# Patient Record
Sex: Female | Born: 1950 | Race: Black or African American | Hispanic: No | Marital: Single
Health system: Midwestern US, Community
[De-identification: ages and names within clinical notes are randomized; demographics above are authoritative.]

## PROBLEM LIST (undated history)

## (undated) DIAGNOSIS — I10 Essential (primary) hypertension: Secondary | ICD-10-CM

## (undated) DIAGNOSIS — E7800 Pure hypercholesterolemia, unspecified: Secondary | ICD-10-CM

## (undated) DIAGNOSIS — J441 Chronic obstructive pulmonary disease with (acute) exacerbation: Secondary | ICD-10-CM

## (undated) DIAGNOSIS — R1031 Right lower quadrant pain: Secondary | ICD-10-CM

## (undated) DIAGNOSIS — R109 Unspecified abdominal pain: Secondary | ICD-10-CM

## (undated) DIAGNOSIS — R0602 Shortness of breath: Secondary | ICD-10-CM

## (undated) DIAGNOSIS — I890 Lymphedema, not elsewhere classified: Secondary | ICD-10-CM

## (undated) DIAGNOSIS — E119 Type 2 diabetes mellitus without complications: Secondary | ICD-10-CM

## (undated) DIAGNOSIS — Z1231 Encounter for screening mammogram for malignant neoplasm of breast: Secondary | ICD-10-CM

---

## 2011-10-22 IMAGING — MG MAMMOGRAPHY SCREENING BILATERAL 3D TOMOSYNTHESIS WITH CAD
1 series · 4 of 4 positions shown · non-contrast
Comparison: none

MAMMOGRAPHY SCREENING WITH CAD, 10/22/11:
HISTORY: Screening.
TECHNIQUE: Digital mammograms were obtained.  These were interpreted both
primarily and with the aid of a computer assisted detection system.
BREAST DENSITY: (Level 3 of 4) The breast tissue is heterogeneously dense.
This may lower the sensitivity of mammography.

[R CC · right · 4 of 4 slices shown]
[im 1/4]
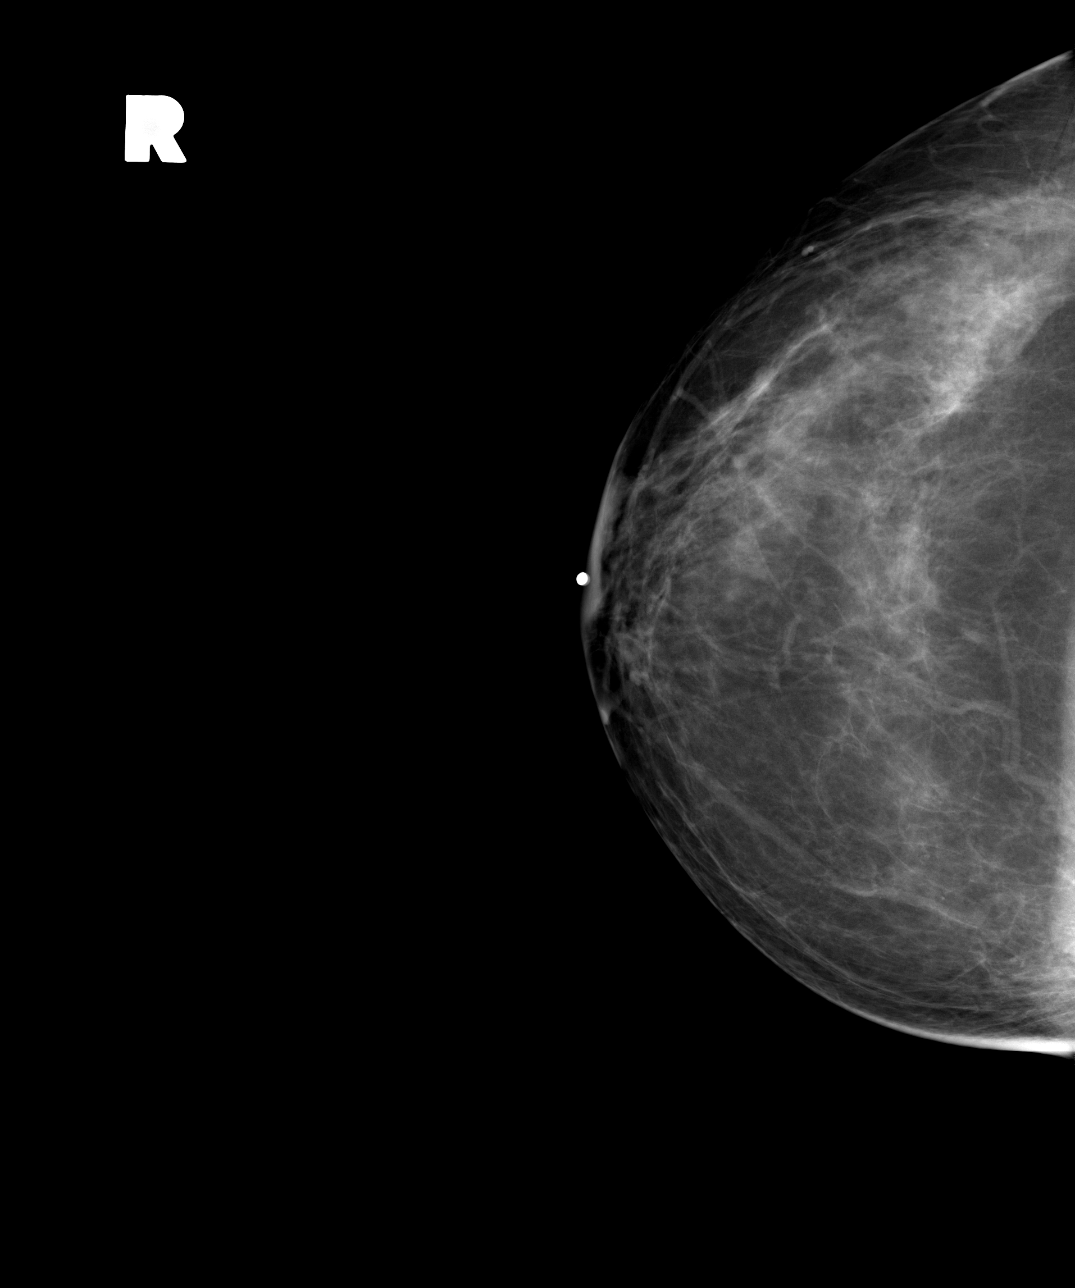
[im 2/4]
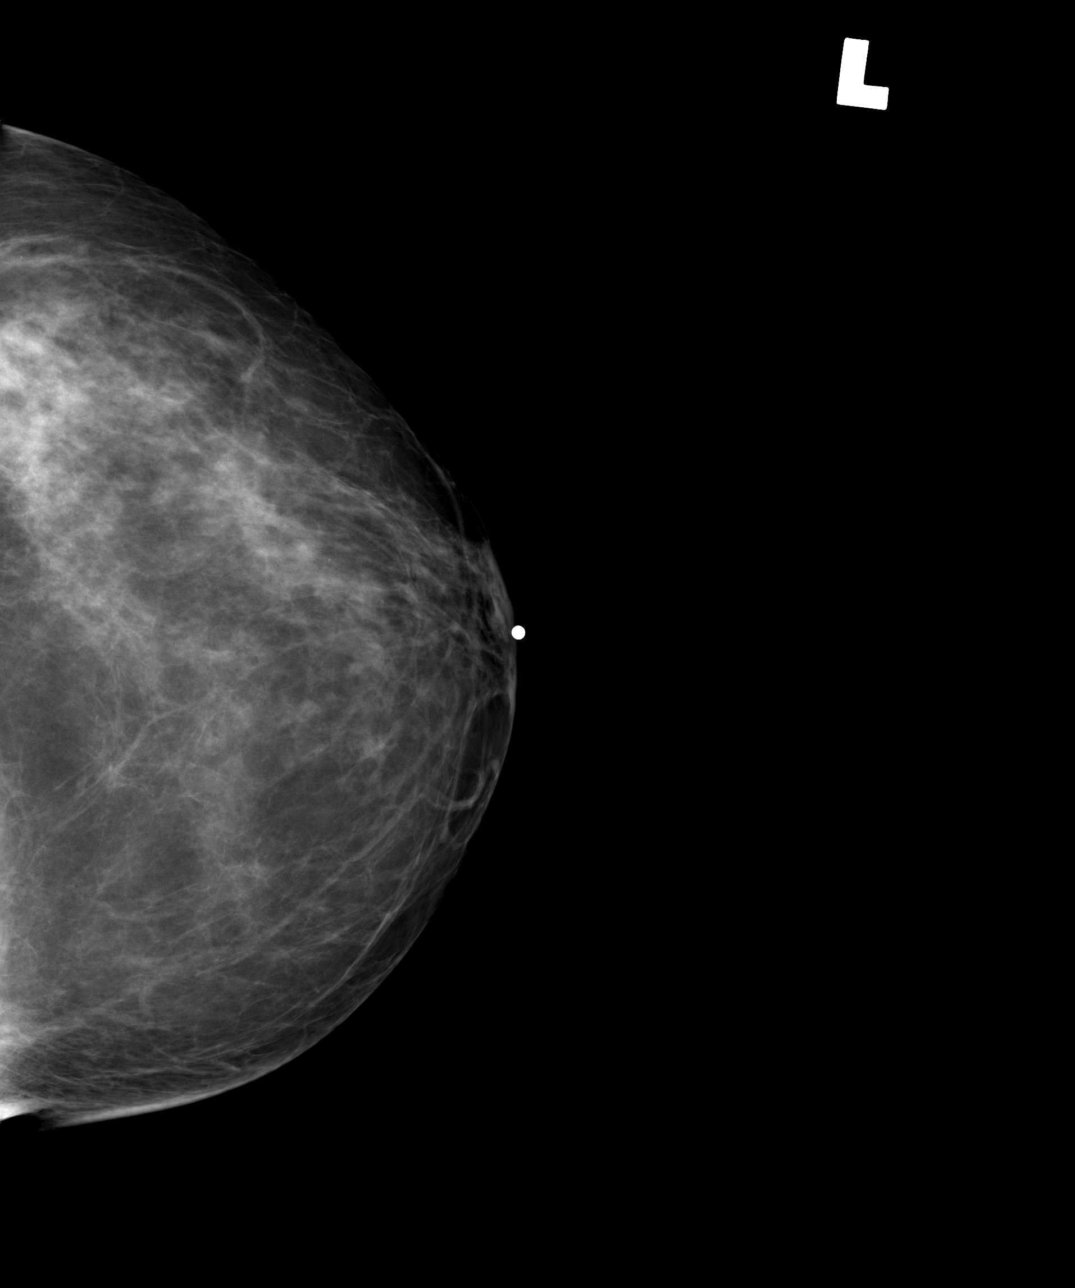
[im 3/4]
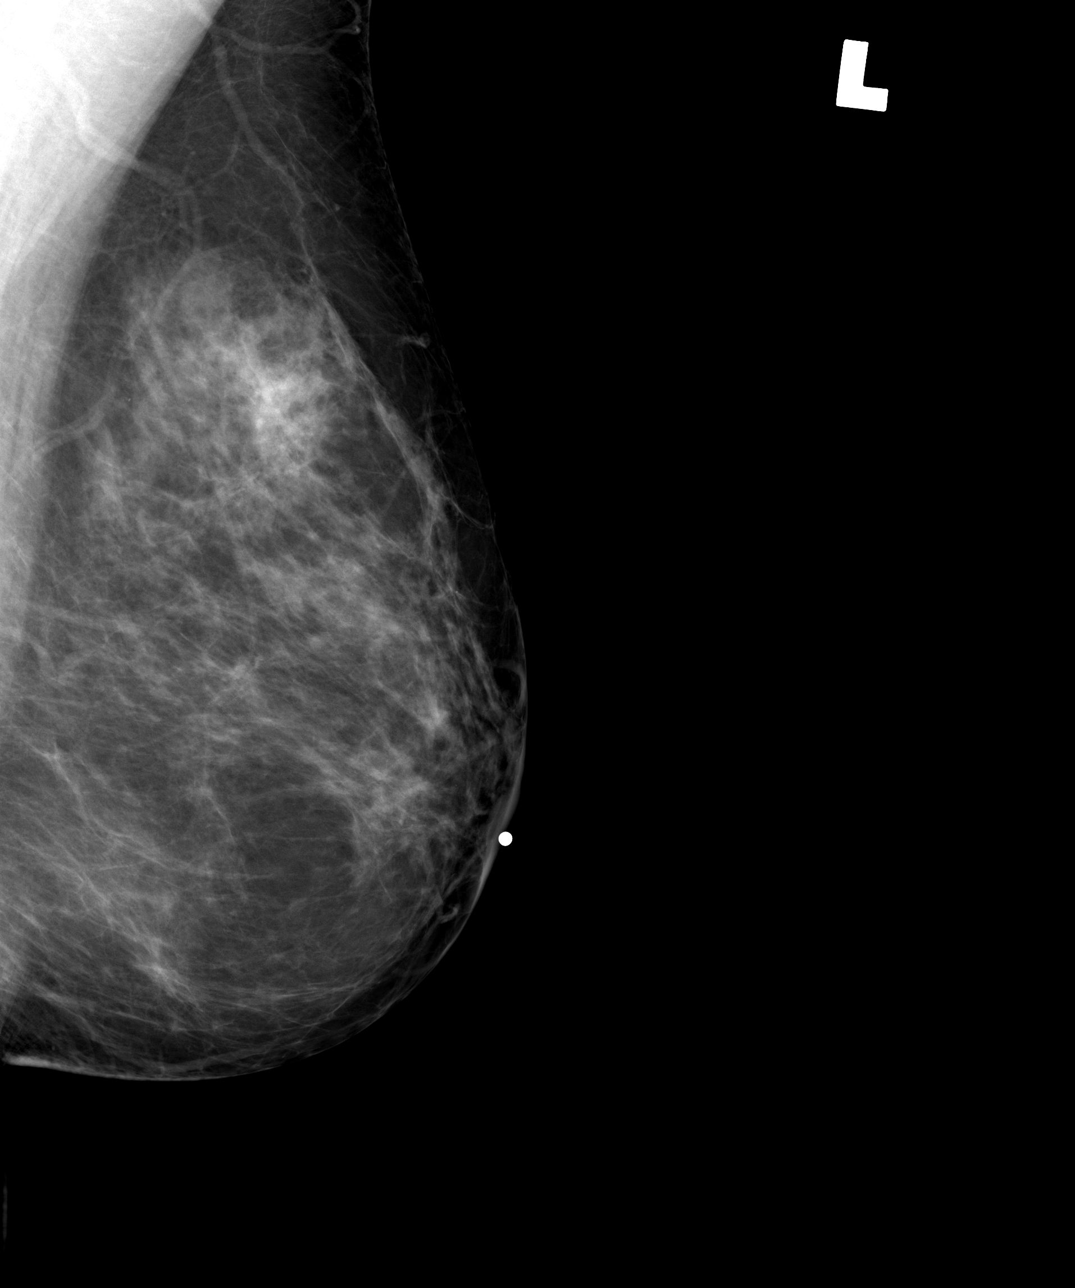
[im 4/4]
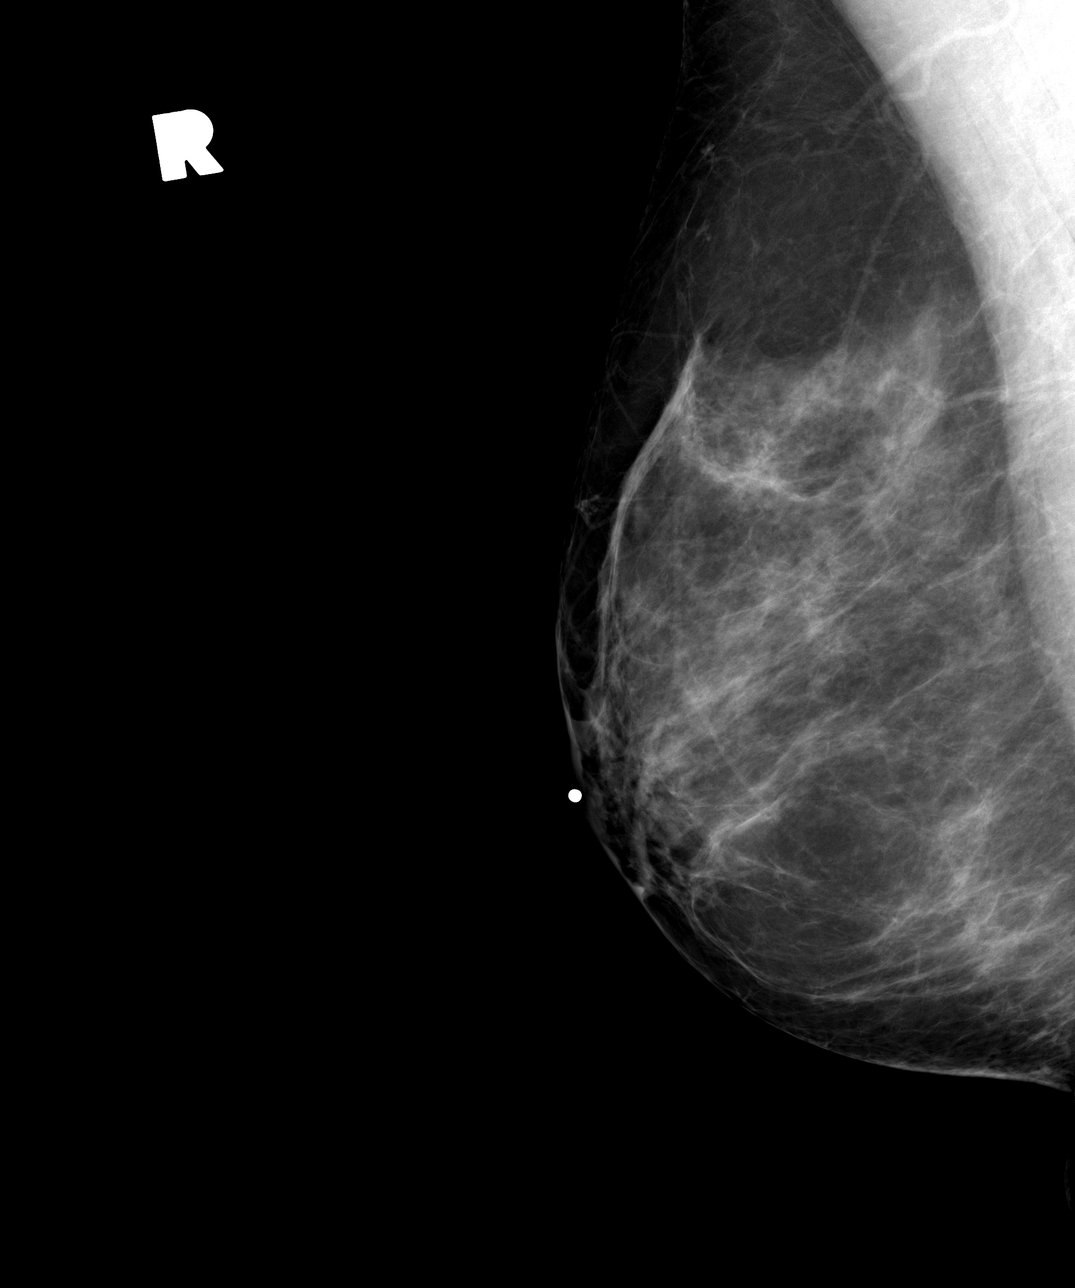

[4 of 4 positions shown; findings below may reference images not displayed]

FINDINGS: There is no dominant or new, developing mass or suspicious
cluster of microcalcifications.  There is no skin thickening, architectural
distortion or nipple retraction.
IMPRESSION: (BI-RADS 1) Negative mammogram.  Routine mammographic follow-up is
recommended.

## 2012-10-26 IMAGING — MG MAMMOGRAPHY SCREENING BILATERAL 3D TOMOSYNTHESIS WITH CAD
12 of 16 series · 12 of 32 positions shown · non-contrast
Comparison: Comparison is made to prior exams dating back to 09/15/06.

MAMMOGRAPHY SCREENING 3D TOMOSYNTHESIS WITH CAD, 10/26/12:
TECHNIQUE: Bilateral oblique mediolateral and craniocaudal full field
digital mammograms and 3D tomosynthesis were obtained.   In addition,
computer aided detection was utilized.
CLINICAL HISTORY: Screening.
BREAST DENSITY: (Level 2 of 4) There are scattered fibroglandular densities
which do not significantly interfere with mammographic visualization.

[R CC]
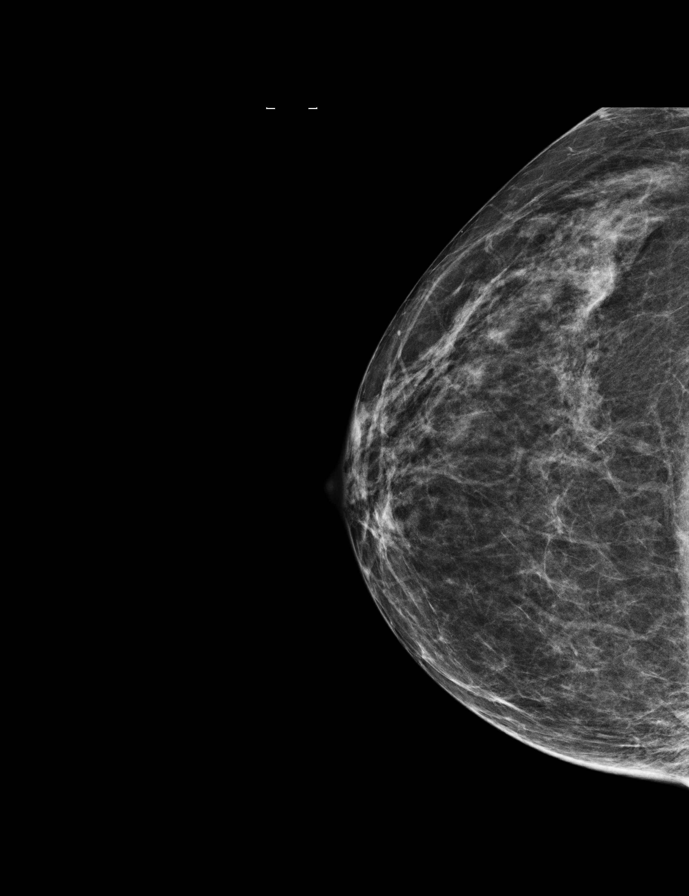

[L CC]
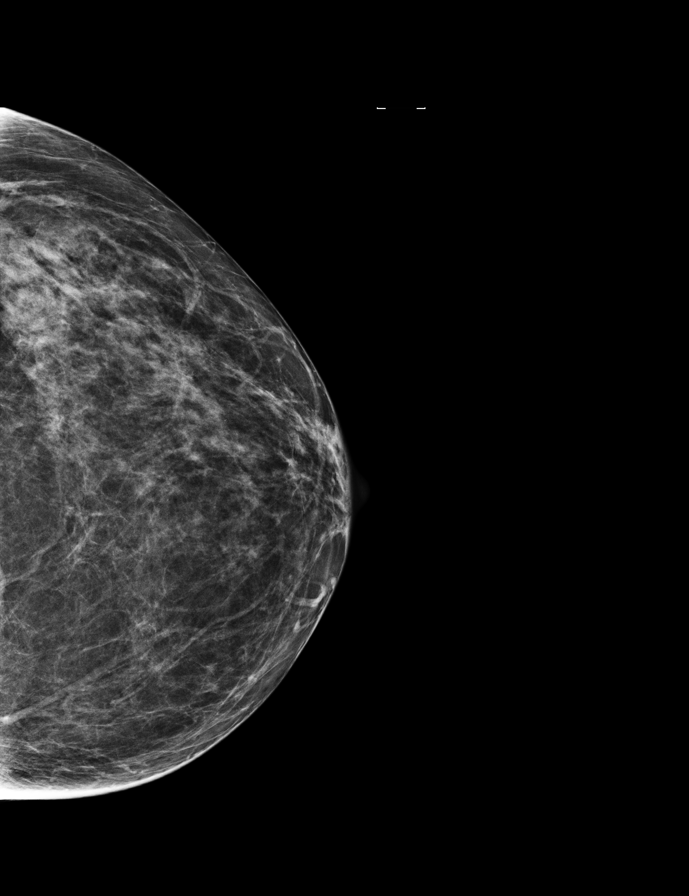

[L MLO]
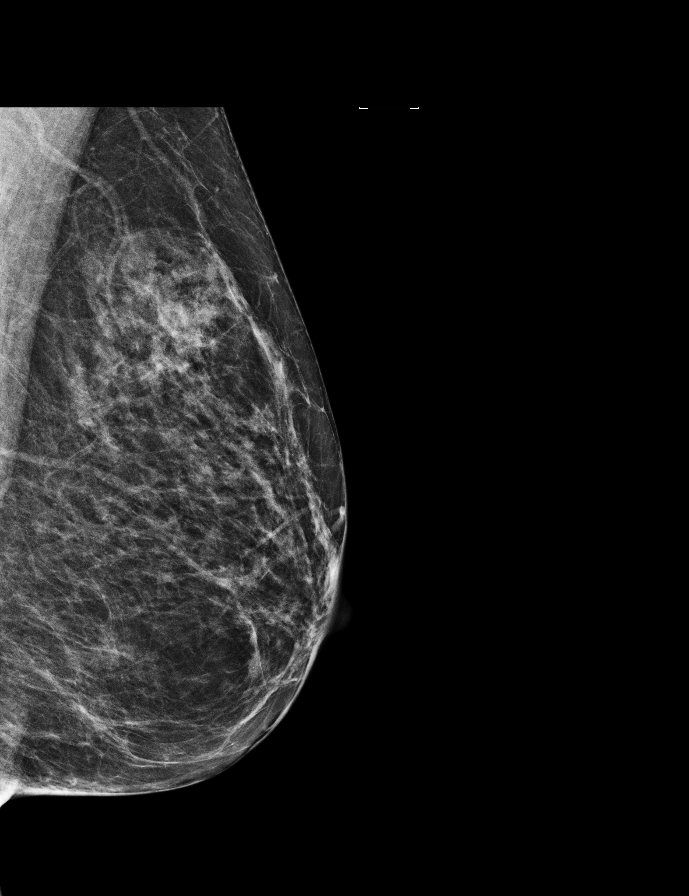

[L CC synth-2D]
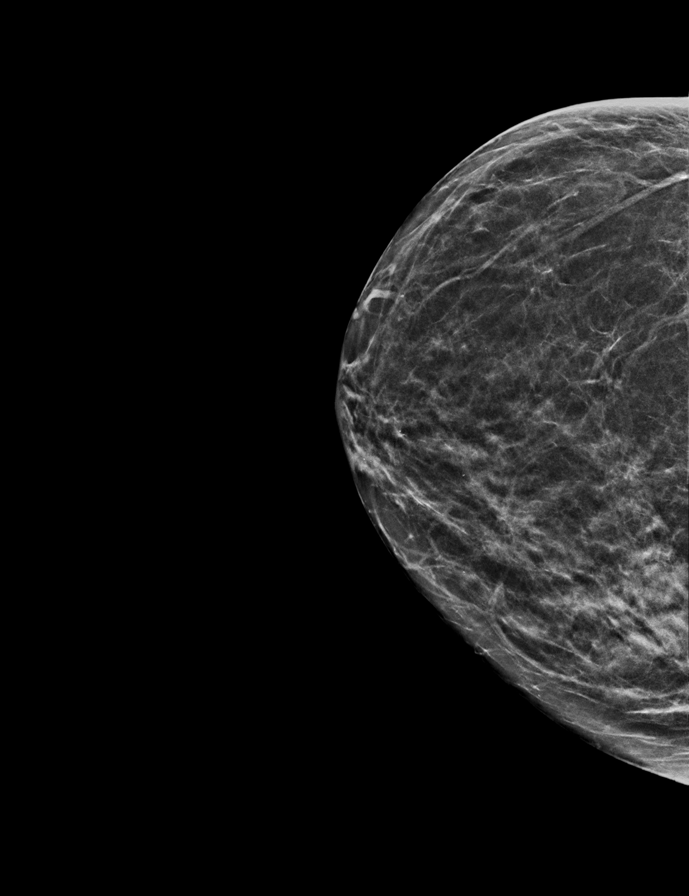

[R CC synth-2D]
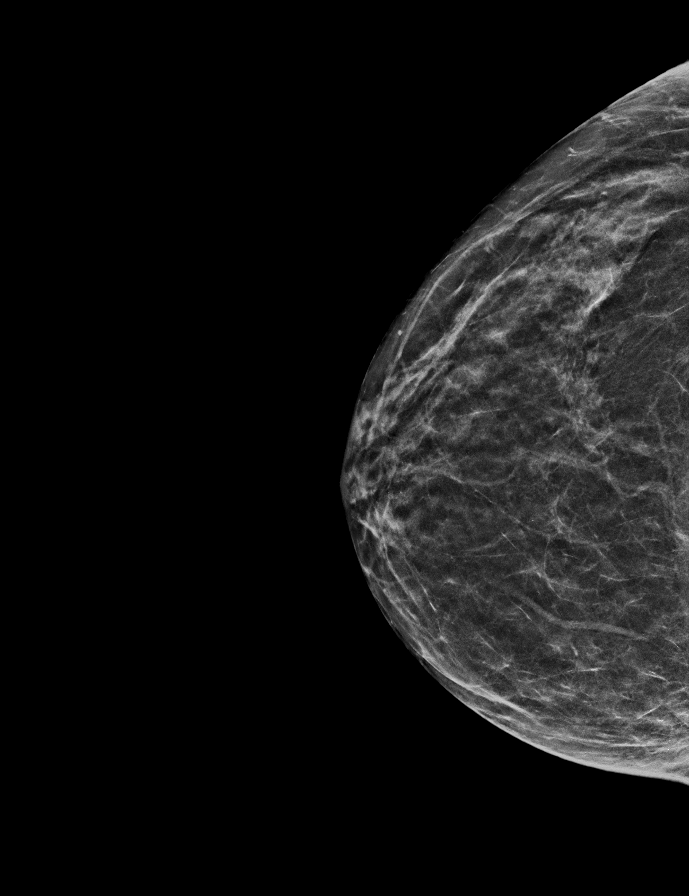

[R MLO synth-2D]
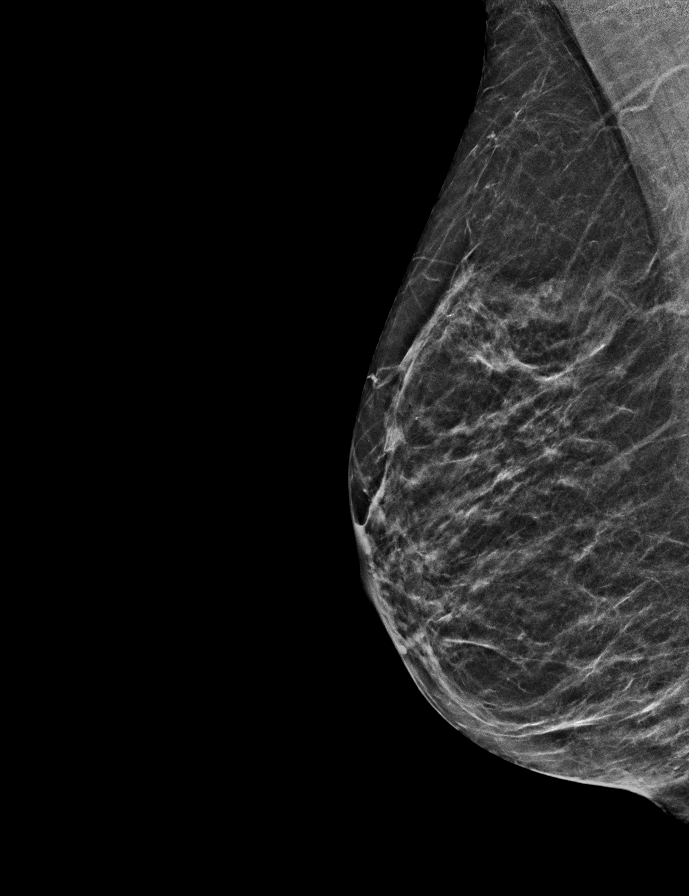

[R MLO]
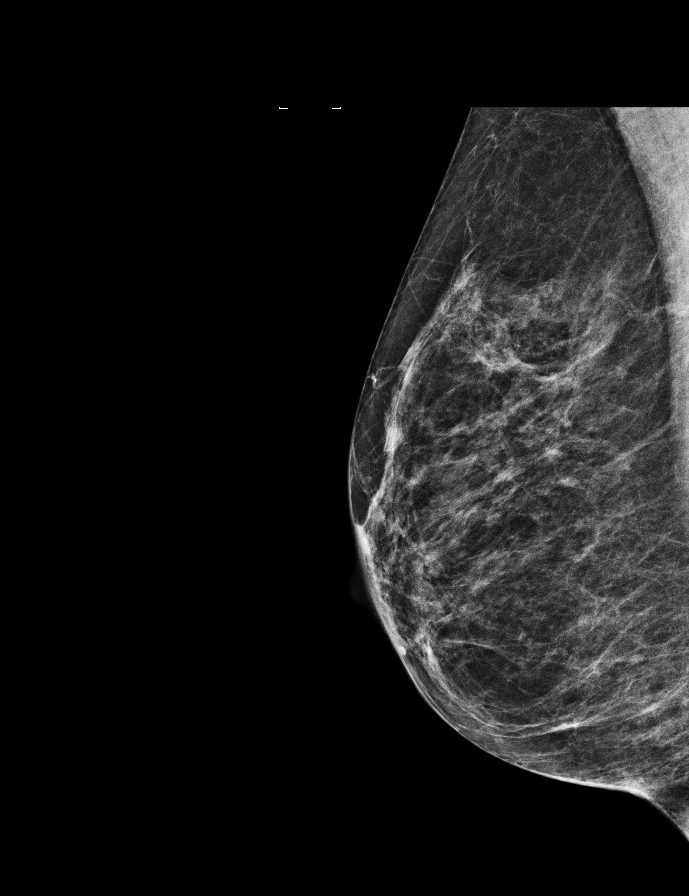

[L MLO synth-2D]
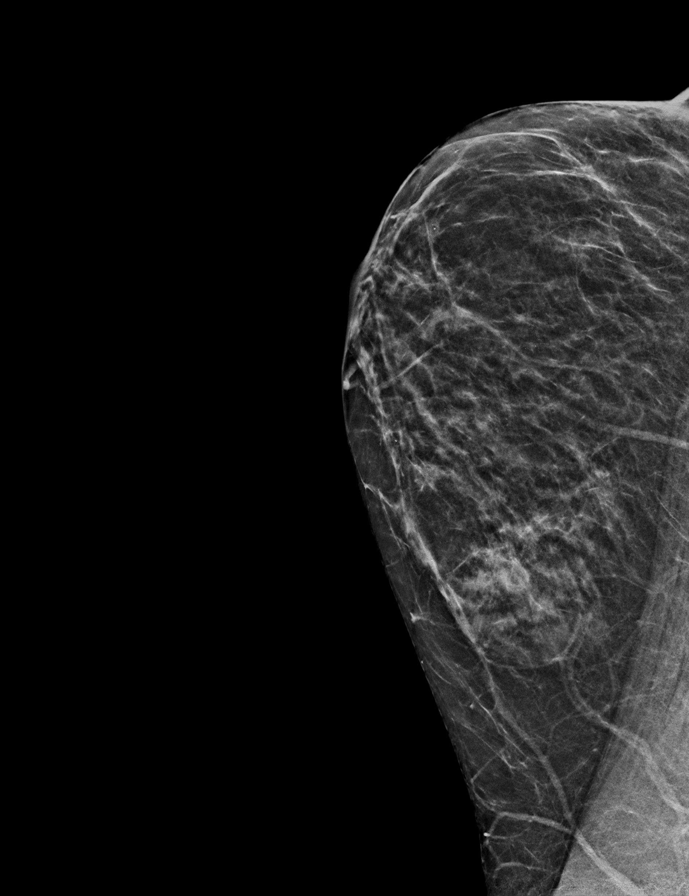

[L MLO tomo]
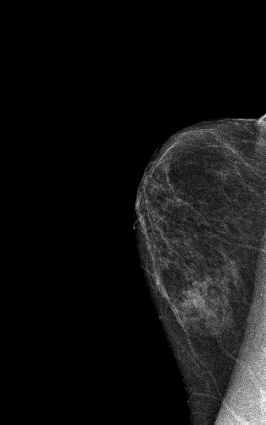

[R MLO tomo]
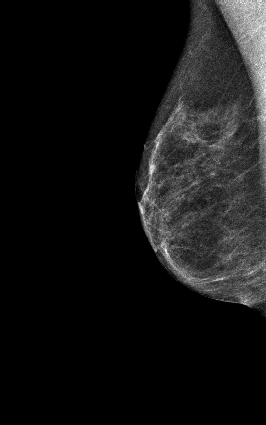

[R CC tomo]
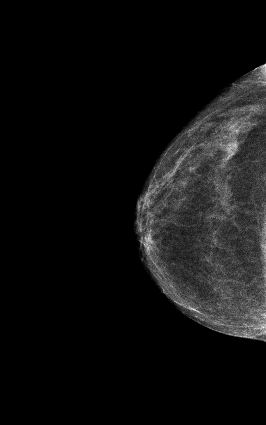

[L CC tomo]
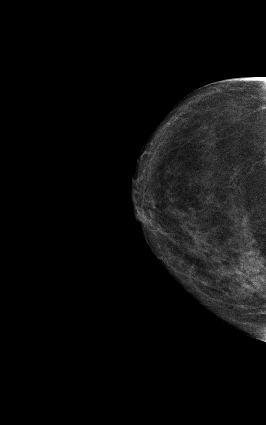

[12 of 32 positions shown; findings below may reference images not displayed]

FINDINGS: There is no evidence of mass nor malignant appearing
microcalcifications seen on today's examination.  No skin thickening is
identified.  There is thought to be an overall stable mammographic
appearance.  Benign calcifications are identified.
IMPRESSION: (BI-RADS 2) Benign findings.  Routine mammographic follow-up is
recommended.

## 2013-10-28 IMAGING — MG MAMMOGRAPHY SCREENING BILATERAL 3D TOMOSYNTHESIS WITH CAD
12 of 16 series · 12 of 32 positions shown · non-contrast
Comparison: Exams dating back to September 2007
BREAST DENSITY: (Level B) There are scattered areas of fibroglandular density.

MAMMOGRAPHY SCREENING 3D TOMOSYNTHESIS WITH CAD, 10/28/2013 [DATE]:
HISTORY: Screening
TECHNIQUE: Digital mammograms and 3-D Tomosynthesis were obtained. These were
interpreted both primarily and with the aid of computer-aided detection
system.

[R MLO synth-2D]
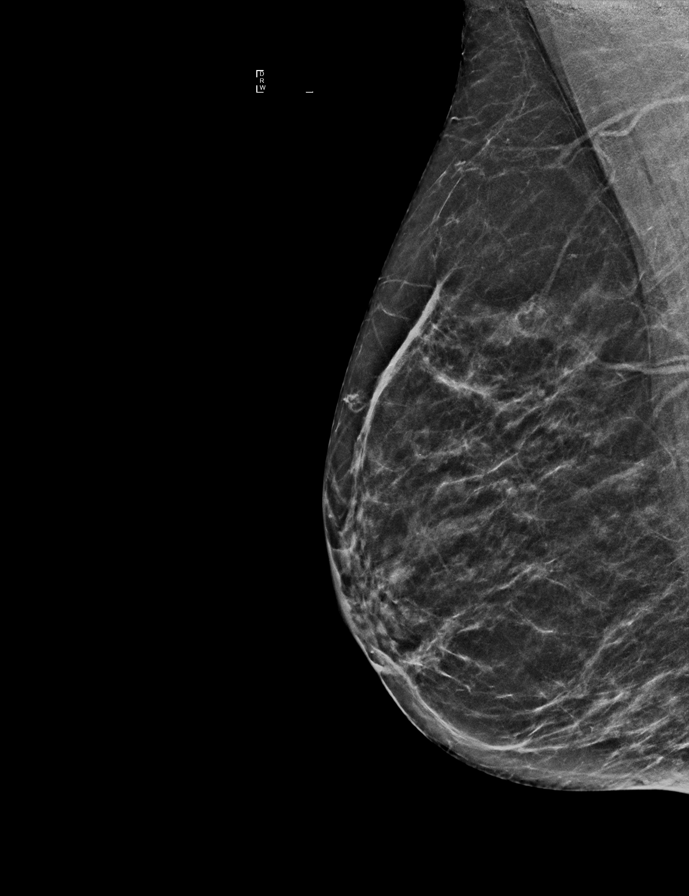

[R CC synth-2D]
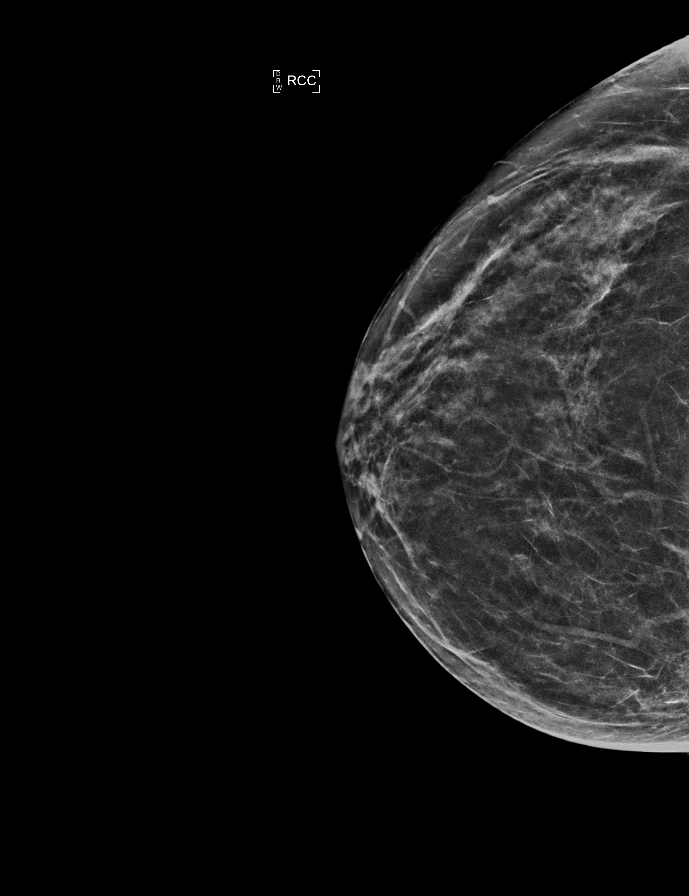

[L CC synth-2D]
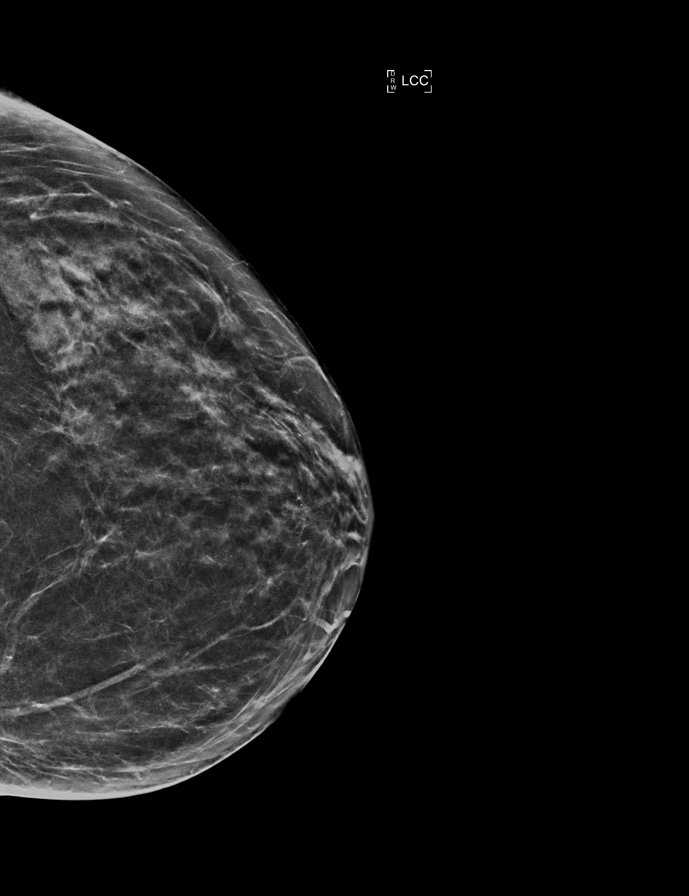

[R CC]
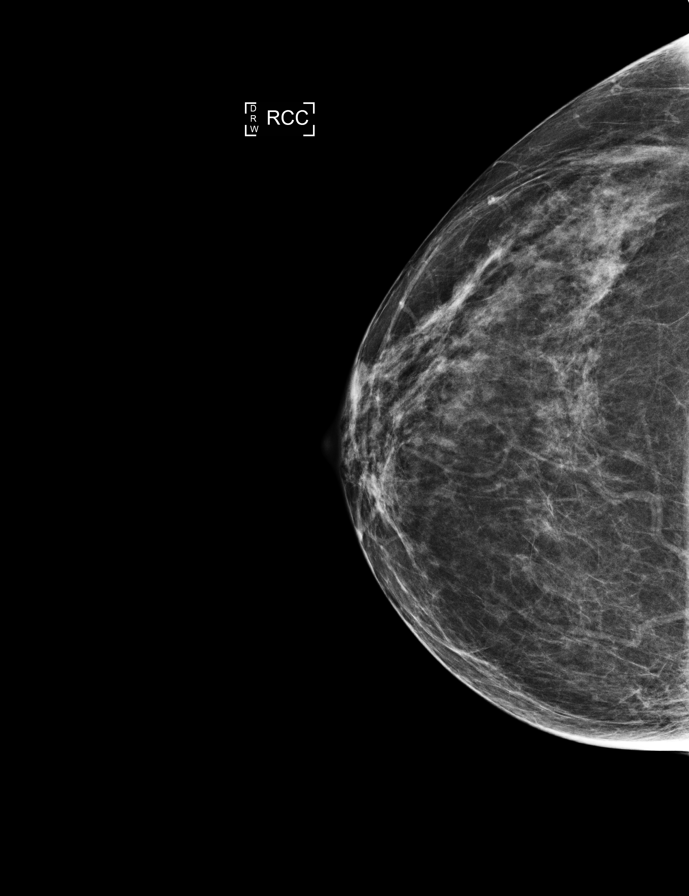

[L CC]
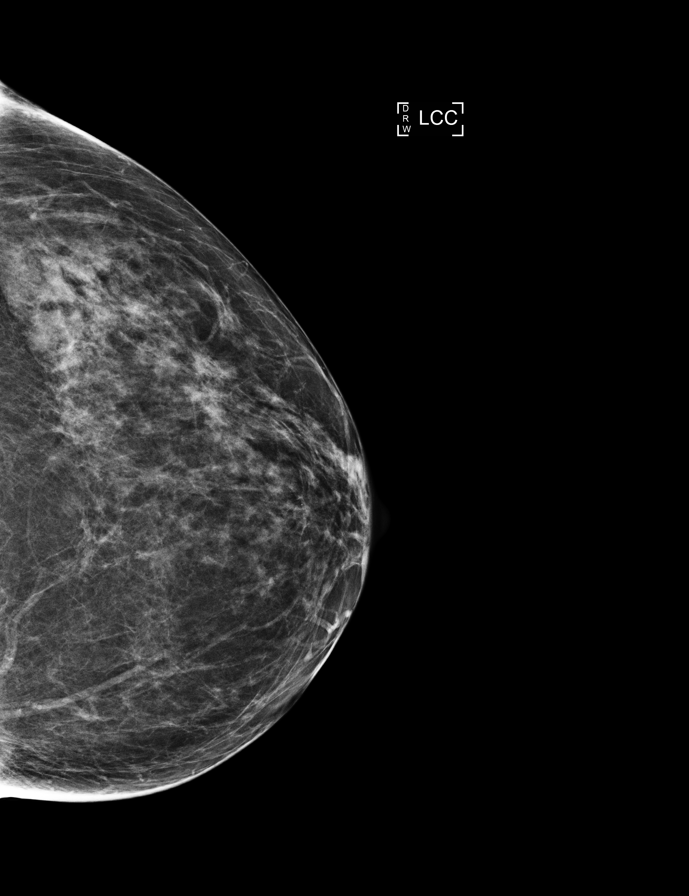

[R MLO]
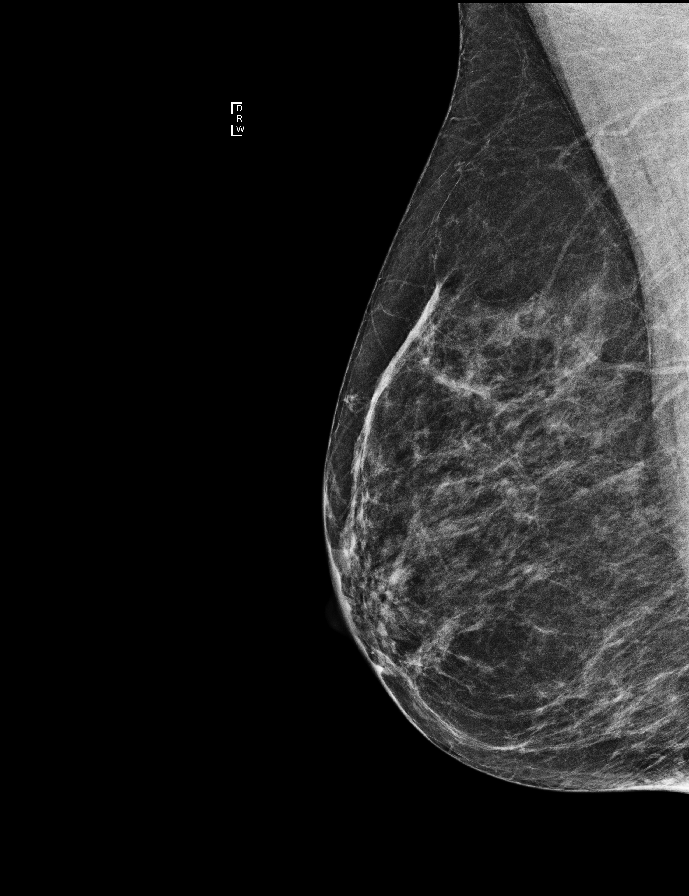

[L MLO synth-2D]
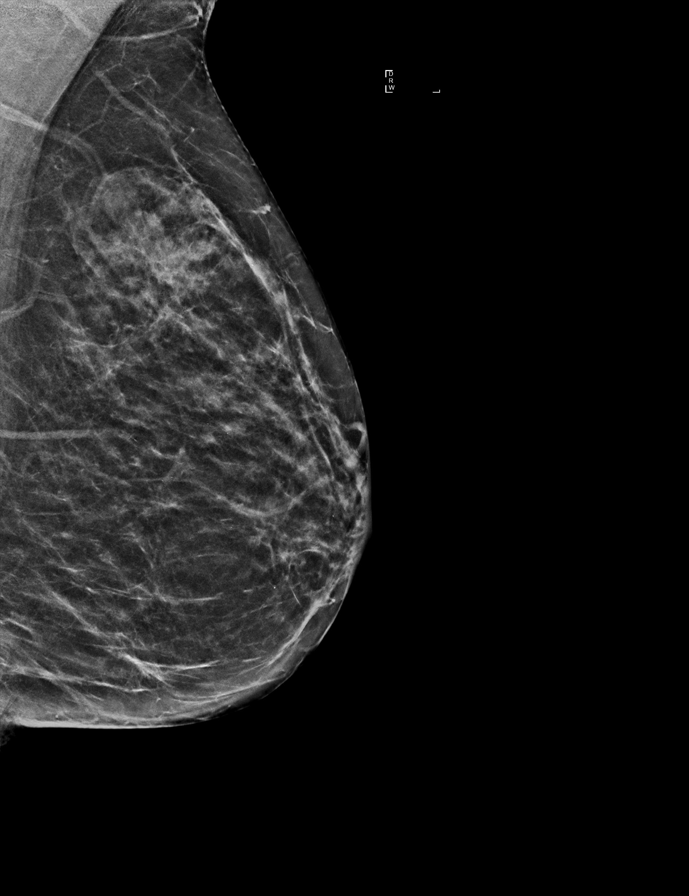

[L MLO]
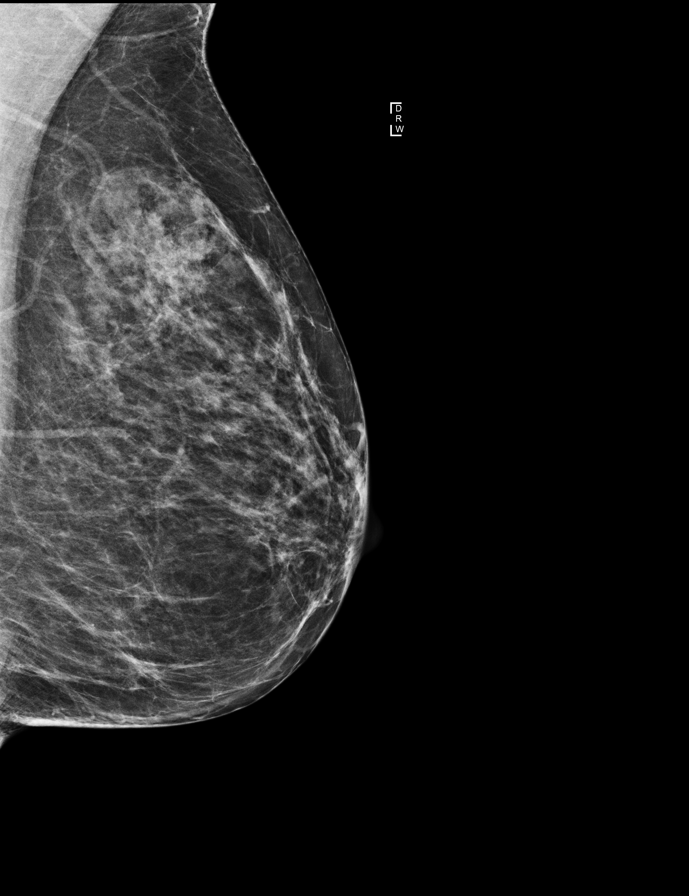

[R MLO tomo]
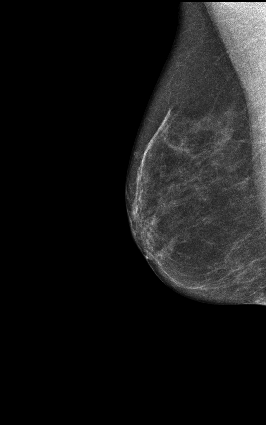

[L CC tomo]
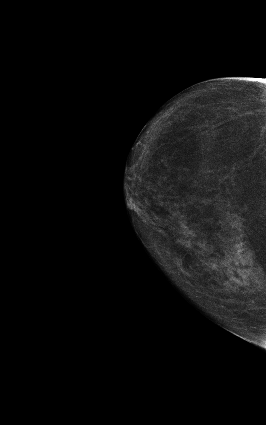

[R CC tomo]
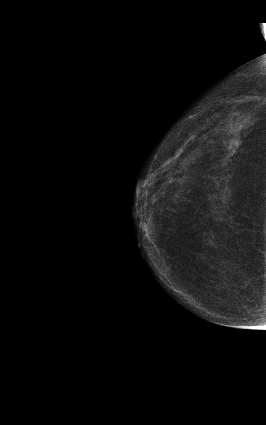

[L MLO tomo]
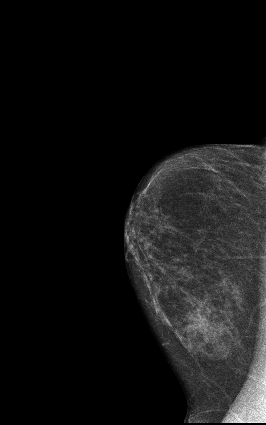

[12 of 32 positions shown; findings below may reference images not displayed]

FINDINGS: There is no evidence of mass or malignant appearing
microcalcifications
seen on today's examination. No skin thickening is identified. There is
thought
to be an overall stable mammographic appearance.
IMPRESSION: ( BI-RADS 1) Negative mammogram. Routine mammographic follow-up is
recommended.

## 2014-06-02 DEATH — deceased

## 2014-09-28 ENCOUNTER — Ambulatory Visit: Admit: 2014-09-28 | Discharge: 2014-09-28 | Payer: MEDICARE | Attending: Internal Medicine | Primary: Internal Medicine

## 2014-09-28 ENCOUNTER — Inpatient Hospital Stay: Admit: 2014-10-12 | Payer: MEDICARE | Primary: Internal Medicine

## 2014-09-28 DIAGNOSIS — I10 Essential (primary) hypertension: Secondary | ICD-10-CM

## 2014-09-28 LAB — AMB POC GLUCOSE, QUANTITATIVE, BLOOD: Glucose POC: 133 mg/dL

## 2014-09-28 LAB — AMB POC HEMOGLOBIN A1C: Hemoglobin A1c (POC): 5.9 %

## 2014-09-28 NOTE — Patient Instructions (Signed)
Isosorbide Dinitrate 10 mg: take 2 tabs every 8 hours  Lisinopril 40 mg: take 1 tab a dayt  Amlodipine 5 mg: take 1 ta b a day  Metoprolol 200 mg 2 times a day  Clonidine 0.1 mg daily

## 2014-09-28 NOTE — Progress Notes (Signed)
Chief Complaint   Patient presents with   ??? New Patient   ??? Establish Care   ??? Hypertension   ??? Knee Pain     RT knee pain (arthritis)   ??? COPD     On 4L of O2 all day   ??? CHF

## 2014-09-28 NOTE — Progress Notes (Signed)
Chief Complaint   Patient presents with   ??? New Patient   ??? Establish Care   ??? Hypertension   ??? Knee Pain     RT knee pain (arthritis)   ??? COPD     On 4L of O2 all day   ??? CHF     HPI:  Brittney Shelton is a 64 y.o. AA female with multiple medical problems including DM, COPD on home oxygen, hypertension, chf presenting to establish care.   COPD:  Has been on home oxygen(4 L) for  Years, does not see lung doctor, stopped smoking at age 60 years  Hypertension: on lisinopril 40 mg, amlodipine 5 mg, clonidine 0.1 mg, toprol XL 200 mg. Blood pressure is noted to elevated  Hypercholesterol: she takes pravastatin   Diabetes type 2: takes metformin 500 mg 2X a day, A1C today=5.9 %, she has an eye surgery scheduled for 10/20/13.  Patient also has h/o chf, Patient has never seen a cardiologist.    Review of Systems  Constitutional: negative for fevers/chills, positive for weight loss  Eyes:   positive for visual disturbance in right eye  Respiratory:  positive for cough, shortness of breath, no wheezing  CV:   negative for chest pain, palpitations, positive lower extremity edema  GI:   negative for nausea, vomiting, diarrhea, abdominal pain  Endo:               negative for polyuria,polydipsia,polyphagia,heat intolerance  Genitourinary: negative for frequency, dysuria  Integument:  negative for rash and pruritus  Hematologic:  negative for easy bruising and gum/nose bleeding  Musculoskel: positive for knee joint pain  Neurological:  negative for headaches, dizziness, vertigo,and gait   Behavl/Psych: negative for feelings of anxiety, depression, mood changes    Past Medical History   Diagnosis Date   ??? Chronic obstructive pulmonary disease (HCC)    ??? Congestive heart failure (HCC)    ??? Hypertension    ??? Diabetes (HCC)      History reviewed. No pertinent past surgical history.    Social History   ??? Marital Status: SINGLE     Spouse Name: N/A   ??? Number of Children: N/A   ??? Years of Education: N/A     Social History Main Topics    ??? Smoking status: Never Smoker    ??? Smokeless tobacco: Never Used   ??? Alcohol Use: No   ??? Drug Use: No   ??? Sexual Activity: No     Family History   Problem Relation Age of Onset   ??? Diabetes Mother    ??? Arthritis-osteo Father    ??? COPD Sister    ??? Diabetes Brother    ??? Diabetes Brother      Current Outpatient Prescriptions   Medication Sig Dispense Refill   ??? albuterol (PROVENTIL VENTOLIN) 2.5 mg /3 mL (0.083 %) nebulizer solution by Nebulization route once.     ??? amLODIPine (NORVASC) 5 mg tablet Take 5 mg by mouth daily.     ??? aspirin 81 mg chewable tablet Take 81 mg by mouth daily.     ??? calcium carbonate (OS-CAL) 500 mg calcium (1,250 mg) tablet Take  by mouth daily.     ??? celecoxib (CELEBREX) 100 mg capsule Take  by mouth two (2) times a day.     ??? cloNIDine HCl (CATAPRES) 0.1 mg tablet Take  by mouth two (2) times a day.     ??? furosemide (LASIX) 20 mg tablet Take  by  mouth daily.     ??? isosorbide dinitrate (ISORDIL) 10 mg tablet Take  by mouth three (3) times daily.     ??? lisinopril (PRINIVIL, ZESTRIL) 40 mg tablet Take 40 mg by mouth daily.     ??? metFORMIN (GLUCOPHAGE) 500 mg tablet Take  by mouth two (2) times daily (with meals).     ??? metoprolol succinate (TOPROL-XL) 200 mg XL tablet Take 200 mg by mouth daily.     ??? Oxygen      ??? ondansetron (ZOFRAN ODT) 4 mg disintegrating tablet Take 4 mg by mouth every eight (8) hours as needed for Nausea.     ??? potassium chloride SR (KLOR-CON 10) 10 mEq tablet Take  by mouth.     ??? pravastatin (PRAVACHOL) 20 mg tablet Take 20 mg by mouth nightly.     ??? traMADol (ULTRAM) 50 mg tablet Take 50 mg by mouth every six (6) hours as needed for Pain.       No Known Allergies    Objective:  BP 178/70 mmHg   Pulse 80   Temp(Src) 96.6 ??F (35.9 ??C) (Oral)   Resp 18   Ht 5\' 6"  (1.676 m)   Wt 317 lb (143.79 kg)   BMI 51.19 kg/m2   SpO2 95%  Physical Exam:   General appearance - alert, morbidly obese appearing, in no distress  Mental status - alert, oriented to person, place, and time   EYE-PERRL, EOMI,  Neck - supple, no jvd, no significant adenopathy   Chest - symmetric decreased air entry, no wheezes, no rales or rhonchi   Heart - normal rate, regular rhythm, elevated blood pressure noted  Abdomen - obese, soft, nontender, nondistended, no organomegaly  Ext-peripheral pulses normal, bilateral lymphedema  Neuro -alert, oriented, normal speech, no focal findings    Assessment/Plan:    ICD-10-CM ICD-9-CM    1. Hypertension, uncontrolled I10 401.9 METABOLIC PANEL, COMPREHENSIVE      MICROALBUMIN:CREATININE RATIO, RANDOM URINE      UA/M W/RFLX CULTURE, ROUTINE   2. Well controlled type 2 diabetes mellitus (HCC) E11.9 250.00 METABOLIC PANEL, COMPREHENSIVE      MICROALBUMIN:CREATININE RATIO, RANDOM URINE      AMB POC HEMOGLOBIN A1C      AMB POC GLUCOSE, QUANTITATIVE, BLOOD      UA/M W/RFLX CULTURE, ROUTINE   3. Hypercholesterolemia E78.0 272.0 LIPID PANEL   4. 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 On home oxygen therapy Z99.81 V46.2    6. Morbid obesity due to excess calories (HCC) E66.01 278.01 LIPID PANEL   7. Anemia, normocytic normochromic D64.9 285.9 CBC WITH AUTOMATED DIFF   8. Chronic acquired lymphedema I89.0 457.1      Patient Instructions   Isosorbide Dinitrate 10 mg: take 2 tabs every 8 hours  Lisinopril 40 mg: take 1 tab a dayt  Amlodipine 5 mg: take 1 ta b a day  Metoprolol 200 mg 2 times a day  Clonidine 0.1 mg daily      Follow-up Disposition:  Return 1-2 weeks, for f/u result and blood pressure.

## 2014-09-29 DIAGNOSIS — I10 Essential (primary) hypertension: Secondary | ICD-10-CM

## 2014-09-29 LAB — METABOLIC PANEL, COMPREHENSIVE
A-G Ratio: 1.3 (ref 1.1–2.5)
ALT (SGPT): 6 IU/L (ref 0–32)
AST (SGOT): 18 IU/L (ref 0–40)
Albumin: 4.4 g/dL (ref 3.6–4.8)
Alk. phosphatase: 65 IU/L (ref 39–117)
BUN/Creatinine ratio: 18 (ref 11–26)
BUN: 18 mg/dL (ref 8–27)
Bilirubin, total: 1 mg/dL (ref 0.0–1.2)
CO2: 27 mmol/L (ref 18–29)
Calcium: 9.4 mg/dL (ref 8.7–10.3)
Chloride: 95 mmol/L — ABNORMAL LOW (ref 97–108)
Creatinine: 1 mg/dL (ref 0.57–1.00)
GFR est AA: 68 mL/min/{1.73_m2} (ref >59)
GFR est non-AA: 59 mL/min/{1.73_m2} — ABNORMAL LOW (ref >59)
GLOBULIN, TOTAL: 3.5 g/dL (ref 1.5–4.5)
Glucose: 99 mg/dL (ref 65–99)
Potassium: 4.1 mmol/L (ref 3.5–5.2)
Protein, total: 7.9 g/dL (ref 6.0–8.5)
Sodium: 143 mmol/L (ref 134–144)

## 2014-09-29 LAB — CBC WITH AUTOMATED DIFF
ABS. BASOPHILS: 0 10*3/uL (ref 0.0–0.2)
ABS. EOSINOPHILS: 0.2 10*3/uL (ref 0.0–0.4)
ABS. IMM. GRANS.: 0 10*3/uL (ref 0.0–0.1)
ABS. MONOCYTES: 0.7 10*3/uL (ref 0.1–0.9)
ABS. NEUTROPHILS: 4.1 10*3/uL (ref 1.4–7.0)
Abs Lymphocytes: 1.4 10*3/uL (ref 0.7–3.1)
BASOPHILS: 1 %
EOSINOPHILS: 2 %
HCT: 41.4 % (ref 34.0–46.6)
HGB: 13.2 g/dL (ref 11.1–15.9)
IMMATURE GRANULOCYTES: 0 %
Lymphocytes: 22 %
MCH: 28.1 pg (ref 26.6–33.0)
MCHC: 31.9 g/dL (ref 31.5–35.7)
MCV: 88 fL (ref 79–97)
MONOCYTES: 10 %
NEUTROPHILS: 65 %
PLATELET: 262 10*3/uL (ref 150–379)
RBC: 4.69 x10E6/uL (ref 3.77–5.28)
RDW: 17.5 % — ABNORMAL HIGH (ref 12.3–15.4)
WBC: 6.3 10*3/uL (ref 3.4–10.8)

## 2014-09-29 LAB — MICROSCOPIC EXAMINATION
Bacteria: NONE SEEN
Casts: NONE SEEN /lpf

## 2014-09-29 LAB — UA/M W/RFLX CULTURE, ROUTINE
Bilirubin: NEGATIVE
Blood: NEGATIVE
Glucose: NEGATIVE
Ketone: NEGATIVE
Leukocyte Esterase: NEGATIVE
Nitrites: NEGATIVE
Specific Gravity: 1.023 (ref 1.005–1.030)
Urobilinogen: 1 mg/dL (ref 0.2–1.0)
pH (UA): 8.5 — ABNORMAL HIGH (ref 5.0–7.5)

## 2014-09-29 LAB — MICROALBUMIN:CREATININE RATIO, RANDOM URINE
Creatinine, urine random: 108.1 mg/dL (ref 15.0–278.0)
Microalb/Creat ratio (ug/mg creat.): 113 mg/g creat — ABNORMAL HIGH (ref 0.0–30.0)
Microalbumin, urine: 122.2 ug/mL — ABNORMAL HIGH (ref 0.0–17.0)

## 2014-09-29 LAB — LIPID PANEL
Cholesterol, total: 226 mg/dL — ABNORMAL HIGH (ref 100–199)
HDL Cholesterol: 57 mg/dL (ref >39)
LDL, calculated: 152 mg/dL — ABNORMAL HIGH (ref 0–99)
Triglyceride: 87 mg/dL (ref 0–149)
VLDL, calculated: 17 mg/dL (ref 5–40)

## 2014-10-10 ENCOUNTER — Ambulatory Visit: Admit: 2014-10-10 | Discharge: 2014-10-10 | Payer: MEDICARE | Attending: Internal Medicine | Primary: Internal Medicine

## 2014-10-10 DIAGNOSIS — E119 Type 2 diabetes mellitus without complications: Secondary | ICD-10-CM

## 2014-10-10 MED ORDER — AMLODIPINE 5 MG TAB
5 mg | ORAL_TABLET | Freq: Every day | ORAL | Status: DC
Start: 2014-10-10 — End: 2014-10-10

## 2014-10-10 MED ORDER — ALBUTEROL SULFATE 0.083 % (0.83 MG/ML) SOLN FOR INHALATION
2.5 mg /3 mL (0.083 %) | Freq: Once | RESPIRATORY_TRACT | Status: AC
Start: 2014-10-10 — End: 2014-10-10

## 2014-10-10 MED ORDER — PRAVASTATIN 20 MG TAB
20 mg | ORAL_TABLET | Freq: Every evening | ORAL | Status: DC
Start: 2014-10-10 — End: 2015-09-27

## 2014-10-10 MED ORDER — ISOSORBIDE DINITRATE 10 MG TAB
10 mg | ORAL_TABLET | Freq: Three times a day (TID) | ORAL | Status: DC
Start: 2014-10-10 — End: 2014-12-18

## 2014-10-10 MED ORDER — GLIMEPIRIDE 4 MG TAB
4 mg | ORAL_TABLET | ORAL | Status: DC
Start: 2014-10-10 — End: 2015-02-18

## 2014-10-10 MED ORDER — LISINOPRIL 40 MG TAB
40 mg | ORAL_TABLET | Freq: Every day | ORAL | Status: DC
Start: 2014-10-10 — End: 2015-02-18

## 2014-10-10 MED ORDER — AMLODIPINE 10 MG TAB
10 mg | ORAL_TABLET | Freq: Every day | ORAL | Status: DC
Start: 2014-10-10 — End: 2015-01-15

## 2014-10-10 MED ORDER — CLONIDINE 0.1 MG TAB
0.1 mg | ORAL_TABLET | Freq: Every day | ORAL | Status: DC
Start: 2014-10-10 — End: 2015-02-18

## 2014-10-10 MED ORDER — METFORMIN 1,000 MG TAB
1000 mg | ORAL_TABLET | Freq: Two times a day (BID) | ORAL | Status: DC
Start: 2014-10-10 — End: 2015-02-18

## 2014-10-10 NOTE — Patient Instructions (Signed)
Blood pressure medications:    1. Lisinopril 40 mg: 1 tab daily    2. Metoprolol 200 mg: 1 tab twice daily    3. Furosemide 40 mg: 3 tabs 3 times a day    4. Isosorbide DN 10 mg: 1 tab 2 time a day    5. Amlodipine 5 mg: take 2 tab a day    6. Clonidine 0.1mg  daily

## 2014-10-10 NOTE — Progress Notes (Signed)
Chief Complaint   Patient presents with   ??? Hypertension     f/u   ??? Results     HPI:  Brittney Shelton is a 64 y.o. AA female morbidly obese with copd, on home oxygen presenting to follow up hypertension and results.  Tot chol=222, ldl=152, hdl=67m/dl. Liver and kidney functions are normal, urine shows 2+ protein, micro albumin=122  Results and management have been discussed with pt, she agrees with plan.  Also blood pressure is not well controlled. I have reviewed blood pressure medication as noted below.    Review of Systems  As per hpi    Past Medical History   Diagnosis Date   ??? [place] obstructive pulmonary disease ([place]    ??? Cong[place] heart [place] ([place]    ??? Hypertension    ??? Diabetes ([place]      History reviewed. No pertinent past surgical history.  Family History   Problem Relation Age of Onset   ??? Diabetes Mother    ??? Arthritis-osteo Father    ??? COPD Sister    ??? Diabetes Brother    ??? Diabetes Brother      Current Outpatient Prescriptions   Medication Sig Dispense Refill   ??? furosemide (LASIX) 40 mg tablet Take 40 mg by mouth two (2) times a day. Take three tablets twice a day.     ??? glimepiride (AMARYL) 4 mg tablet Take 1 Tab by mouth every morning. 30 Tab 5   ??? albuterol (PROVENTIL VENTOLIN) 2.5 mg /3 mL (0.083 %) nebulizer solution 3 mL by Nebulization route once for 1 dose. 24 Each 5   ??? cloNIDine HCl (CATAPRES) 0.1 mg tablet Take 1 Tab by mouth daily. 30 Tab 5   ??? isosorbide dinitrate (ISORDIL) 10 mg tablet Take 1 Tab by mouth three (3) times daily. 2 tablets TID 90 Tab 5   ??? lisinopril (PRINIVIL, ZESTRIL) 40 mg tablet Take 1 Tab by mouth daily. 30 Tab 5   ??? pravastatin (PRAVACHOL) 20 mg tablet Take 1 Tab by mouth nightly. 30 Tab 5   ??? metFORMIN (GLUCOPHAGE) 1,000 mg tablet Take 1 Tab by mouth two (2) times daily (with meals). 60 Tab 5   ??? amLODIPine (NORVASC) 10 mg tablet Take 1 Tab by mouth daily. 30 Tab 5   ??? aspirin 81 mg chewable tablet Take 81 mg by mouth daily.      ??? calcium carbonate (OS-CAL) 500 mg calcium (1,250 mg) tablet Take  by mouth daily.     ??? celecoxib (CELEBREX) 100 mg capsule Take  by mouth two (2) times a day.     ??? Oxygen      ??? potassium chloride SR (KLOR-CON 10) 10 mEq tablet Take  by mouth.     ??? traMADol (ULTRAM) 50 mg tablet Take 50 mg by mouth every six (6) hours as needed for Pain.       No Known Allergies    Objective:  BP 158/86 mmHg   Pulse 65   Temp(Src) 97.6 ??F (36.4 ??C) (Oral)   Resp 22   Ht '5\' 6"'  (1.676 m)   Wt 317 lb 9.6 oz (144.062 kg)   BMI 51.29 kg/m2   SpO2 94%   LMP  (LMP Unknown)  Physical Exam:   General appearance - alert, well appearing, in no distress  Mental status - alert, oriented to person, place, and time  EYE-PERRL, EOMI  Neck - supple, no significant adenopathy   Chest - clear to auscultation, no  wheezes, rales or rhonchi  Heart - normal rate, regular rhythm, elevated blood pressure noted  Abdomen - soft, nontender, nondistended, no organomegaly  Ext-peripheral pulses normal, no pedal edema  Neuro -alert, oriented, normal speech, no focal findings    Results for orders placed or performed in visit on 27/03/50   METABOLIC PANEL, COMPREHENSIVE   Result Value Ref Range    Glucose 99 65 - 99 mg/dL    BUN 18 8 - 27 mg/dL    Creatinine 1.00 0.57 - 1.00 mg/dL    GFR est non-AA 59 (L) >59 mL/min/1.73    GFR est AA 68 >59 mL/min/1.73    BUN/Creatinine ratio 18 11 - 26    Sodium 143 134 - 144 mmol/L    Potassium 4.1 3.5 - 5.2 mmol/L    Chloride 95 (L) 97 - 108 mmol/L    CO2 27 18 - 29 mmol/L    Calcium 9.4 8.7 - 10.3 mg/dL    Protein, total 7.9 6.0 - 8.5 g/dL    Albumin 4.4 3.6 - 4.8 g/dL    GLOBULIN, TOTAL 3.5 1.5 - 4.5 g/dL    A-G Ratio 1.3 1.1 - 2.5    Bilirubin, total 1.0 0.0 - 1.2 mg/dL    Alk. phosphatase 65 39 - 117 IU/L    AST 18 0 - 40 IU/L    ALT 6 0 - 32 IU/L   LIPID PANEL   Result Value Ref Range    Cholesterol, total 226 (H) 100 - 199 mg/dL    Triglyceride 87 0 - 149 mg/dL    HDL Cholesterol 57 >39 mg/dL     VLDL, calculated 17 5 - 40 mg/dL    LDL, calculated 152 (H) 0 - 99 mg/dL   MICROALBUMIN:CREATININE RATIO, RANDOM URINE   Result Value Ref Range    Creatinine, urine 108.1 15.0 - 278.0 mg/dL    Microalbumin, urine 122.2 (H) 0.0 - 17.0 ug/mL    Microalb/Creat ratio (ug/mg creat.) 113.0 (H) 0.0 - 30.0 mg/g creat   CBC WITH AUTOMATED DIFF   Result Value Ref Range    WBC 6.3 3.4 - 10.8 x10E3/uL    RBC 4.69 3.77 - 5.28 x10E6/uL    HGB 13.2 11.1 - 15.9 g/dL    HCT 41.4 34.0 - 46.6 %    MCV 88 79 - 97 fL    MCH 28.1 26.6 - 33.0 pg    MCHC 31.9 31.5 - 35.7 g/dL    RDW 17.5 (H) 12.3 - 15.4 %    PLATELET 262 150 - 379 x10E3/uL    NEUTROPHILS 65 %    Lymphocytes 22 %    MONOCYTES 10 %    EOSINOPHILS 2 %    BASOPHILS 1 %    ABS. NEUTROPHILS 4.1 1.4 - 7.0 x10E3/uL    Abs Lymphocytes 1.4 0.7 - 3.1 x10E3/uL    ABS. MONOCYTES 0.7 0.1 - 0.9 x10E3/uL    ABS. EOSINOPHILS 0.2 0.0 - 0.4 x10E3/uL    ABS. BASOPHILS 0.0 0.0 - 0.2 x10E3/uL    IMMATURE GRANULOCYTES 0 %    ABS. IMM. GRANS. 0.0 0.0 - 0.1 x10E3/uL   UA/M W/RFLX CULTURE, ROUTINE   Result Value Ref Range    Specific Gravity 1.023 1.005 - 1.030    pH (UA) 8.5 (H) 5.0 - 7.5    Color Yellow Yellow    Appearance Cloudy (A) Clear    Leukocyte Esterase Negative Negative    Protein 2+ (A) Negative/Trace  Glucose Negative Negative    Ketone Negative Negative    Blood Negative Negative    Bilirubin Negative Negative    Urobilinogen 1.0 0.2 - 1.0 mg/dL    Nitrites Negative Negative    Microscopic Examination See additional order     URINALYSIS REFLEX Comment    MICROSCOPIC EXAMINATION   Result Value Ref Range    WBC 0-5 0 -  5 /hpf    RBC 0-2 0 -  2 /hpf    Epithelial cells 0-10 0 - 10 /hpf    Casts None seen None seen /lpf    Mucus Present Not Estab.    Bacteria None seen None seen/Few   AMB POC HEMOGLOBIN A1C   Result Value Ref Range    Hemoglobin A1c (POC) 5.9 %   AMB POC GLUCOSE, QUANTITATIVE, BLOOD   Result Value Ref Range    Glucose POC 133 mg/dL     Assessment/Plan:     ICD-10-CM ICD-9-CM    1. Well controlled type 2 diabetes mellitus ([place]) E11.9 250.00 glimepiride (AMARYL) 4 mg tablet   2. Hypertension, uncontrolled I10 401.9 cloNIDine HCl (CATAPRES) 0.1 mg tablet      isosorbide dinitrate (ISORDIL) 10 mg tablet      lisinopril (PRINIVIL, ZESTRIL) 40 mg tablet      amLODIPine (NORVASC) 10 mg tablet      REFERRAL TO PULMONARY DISEASE      DISCONTINUED: amLODIPine (NORVASC) 5 mg tablet   3. Hypercholesterolemia E78.0 272.0 pravastatin (PRAVACHOL) 20 mg tablet   4. 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 Pre-op testing Z01.818 V72.84      Patient Instructions   Blood pressure medications:    1. Lisinopril 40 mg: 1 tab daily    2. Metoprolol 200 mg: 1 tab twice daily    3. Furosemide 40 mg: 3 tabs 3 times a day    4. Isosorbide DN 10 mg: 1 tab 2 time a day    5. Amlodipine 5 mg: take 2 tab a day    6. Clonidine 0.58m daily     Follow-up Disposition:  Return 1 week, for f/u Blood pressure.

## 2014-10-10 NOTE — Progress Notes (Signed)
Chief Complaint   Patient presents with   ??? Hypertension     f/u   ??? Results     Per Dr. Raiford Simmondsomah isosorbide dinitrate (ISORDIL) 10 mg tablet was changed to BID.

## 2014-10-17 ENCOUNTER — Ambulatory Visit: Admit: 2014-10-17 | Discharge: 2014-10-17 | Payer: MEDICARE | Attending: Internal Medicine | Primary: Internal Medicine

## 2014-10-17 DIAGNOSIS — I10 Essential (primary) hypertension: Secondary | ICD-10-CM

## 2014-10-17 NOTE — Progress Notes (Signed)
Chief Complaint   Patient presents with   ??? Hypertension     f/u     Subjective:  Brittney Shelton  is a 64 y.o. AA female  who presents for follow up of hypertension. Blood pressure is elevated   taking medications as instructed, no medication side effects noted, no chest pain on exertion, no dyspnea on exertion, no swelling of ankles. .    Current Outpatient Prescriptions   Medication Sig Dispense Refill   ??? metoprolol succinate (TOPROL-XL) 200 mg XL tablet Take 200 mg by mouth two (2) times a day.     ??? furosemide (LASIX) 40 mg tablet Take 40 mg by mouth two (2) times a day. Take three tablets twice a day.     ??? glimepiride (AMARYL) 4 mg tablet Take 1 Tab by mouth every morning. 30 Tab 5   ??? cloNIDine HCl (CATAPRES) 0.1 mg tablet Take 1 Tab by mouth daily. 30 Tab 5   ??? isosorbide dinitrate (ISORDIL) 10 mg tablet Take 1 Tab by mouth three (3) times daily. 2 tablets TID (Patient taking differently: Take 10 mg by mouth two (2) times a day. 2 tablets TID) 90 Tab 5   ??? lisinopril (PRINIVIL, ZESTRIL) 40 mg tablet Take 1 Tab by mouth daily. 30 Tab 5   ??? pravastatin (PRAVACHOL) 20 mg tablet Take 1 Tab by mouth nightly. 30 Tab 5   ??? metFORMIN (GLUCOPHAGE) 1,000 mg tablet Take 1 Tab by mouth two (2) times daily (with meals). 60 Tab 5   ??? amLODIPine (NORVASC) 10 mg tablet Take 1 Tab by mouth daily. 30 Tab 5   ??? aspirin 81 mg chewable tablet Take 81 mg by mouth daily.     ??? calcium carbonate (OS-CAL) 500 mg calcium (1,250 mg) tablet Take  by mouth daily.     ??? Oxygen        Past Medical History   Diagnosis Date   ??? Chronic obstructive pulmonary disease (HCC)    ??? Congestive heart failure (HCC)    ??? Hypertension    ??? Diabetes (HCC)       Review of Systems, additional:  Pertinent items are noted in HPI.    Objective:  Blood pressure 191/88, pulse 66, temperature 97.4 ??F (36.3 ??C), temperature source Oral, resp. rate 20, height 5\' 6"  (1.676 m), weight 312 lb (141.522 kg), SpO2 93 %.   No LMP recorded (lmp unknown). Patient is not currently having periods (Reason: Menopause).   Appearance: alert, well appearing, and in no distress and oriented to person, place, and time.  General exam: CVS exam BP noted to be moderately elevated today in office.    Assessment/Plan:    ICD-10-CM ICD-9-CM    1. Hypertension, uncontrolled I10 401.9      Patient Instructions     Blood pressure medications:  ?? 1. Lisinopril 40 mg: 1 tab daily  ?? 2. Metoprolol 200 mg: 1 tab twice daily  ?? 3. Furosemide 40 mg: 3 tabs 3 times a day  ?? 4. Isosorbide DN 10 mg: 2 tab 2 time a day  ?? 5. Amlodipine 5 mg: take 2 tab togather a day  ?? 6. Clonidine 0.1mg  daily   ??      ??         Follow-up Disposition:  Return in about 2 months (around 12/17/2014) for f/u BP.

## 2014-10-17 NOTE — Patient Instructions (Signed)
Blood pressure medications:  ?? 1. Lisinopril 40 mg: 1 tab daily  ?? 2. Metoprolol 200 mg: 1 tab twice daily  ?? 3. Furosemide 40 mg: 3 tabs 3 times a day  ?? 4. Isosorbide DN 10 mg: 2 tab 2 time a day  ?? 5. Amlodipine 5 mg: take 2 tab togather a day  ?? 6. Clonidine 0.1mg  daily   ??      ??

## 2014-10-17 NOTE — Progress Notes (Signed)
Chief Complaint   Patient presents with   ??? Hypertension     f/u     Eye exam scheduled for 10/23/14.

## 2014-11-27 NOTE — Telephone Encounter (Signed)
-----   [facility] sent at 11/27/2014 12:14 PM EDT -----  Regarding: Dr. Roanna Raideromah/Telephone  [facility] [phone] is requesting a call with Dr. Achille Richomah's NPI # so they can send pt's diabetic supplies.  Pt can be reached at [phone].

## 2014-11-28 NOTE — Telephone Encounter (Signed)
Called & they are faxing form

## 2014-12-18 ENCOUNTER — Ambulatory Visit: Admit: 2014-12-18 | Discharge: 2014-12-18 | Payer: MEDICARE | Attending: Internal Medicine | Primary: Internal Medicine

## 2014-12-18 DIAGNOSIS — E119 Type 2 diabetes mellitus without complications: Secondary | ICD-10-CM

## 2014-12-18 LAB — AMB POC GLUCOSE BLOOD, BY GLUCOSE MONITORING DEVICE: Glucose POC: 84 mg/dL

## 2014-12-18 MED ORDER — METOPROLOL SUCCINATE SR 200 MG 24 HR TAB
200 mg | ORAL_TABLET | Freq: Every day | ORAL | Status: DC
Start: 2014-12-18 — End: 2015-02-18

## 2014-12-18 MED ORDER — ISOSORBIDE DINITRATE 30 MG TAB
30 mg | ORAL_TABLET | Freq: Three times a day (TID) | ORAL | Status: DC
Start: 2014-12-18 — End: 2015-02-18

## 2014-12-18 NOTE — Progress Notes (Signed)
Chief Complaint   Patient presents with   ??? Diabetes   ??? Hypertension   ??? Knee Pain     HPI:  Brittney Shelton is a 64 y.o. AA female presenting to follow up on Diabetes, Hypertension, Knee Pain  Blood pressure is elevated on metoprolol 200 mg, Lisinopril 40 mg, Furosemide 40 mg 3 tabs 3 times a day,   Isosorbide DN 10 mg: 2 tab 2 time a day, Amlodipine 5 mg: take 2 tab togather a day, Clonidine 0.1mg  daily??  She is taking medications as instructed, no medication side effects noted, no chest pain on exertion, no dyspnea on exertion, no swelling of ankles.     Review of Systems  As per hpi    Past Medical History   Diagnosis Date   ??? Chronic obstructive pulmonary disease (HCC)    ??? Congestive heart failure (HCC)    ??? Hypertension    ??? Diabetes (HCC)      No past surgical history on file.  History     Social History   ??? Marital Status: SINGLE     Spouse Name: N/A   ??? Number of Children: N/A   ??? Years of Education: N/A     Social History Main Topics   ??? Smoking status: Never Smoker    ??? Smokeless tobacco: Never Used   ??? Alcohol Use: No   ??? Drug Use: No   ??? Sexual Activity: No     Current Outpatient Prescriptions   Medication Sig Dispense Refill   ??? metoprolol succinate (TOPROL-XL) 200 mg XL tablet Take 1 Tab by mouth daily. Indications: HYPERTENSION 30 Tab 5   ??? isosorbide dinitrate (ISORDIL) 30 mg tablet Take 1 Tab by mouth three (3) times daily. 90 Tab 5   ??? furosemide (LASIX) 40 mg tablet Take 40 mg by mouth two (2) times a day. Take three tablets twice a day.     ??? glimepiride (AMARYL) 4 mg tablet Take 1 Tab by mouth every morning. 30 Tab 5   ??? cloNIDine HCl (CATAPRES) 0.1 mg tablet Take 1 Tab by mouth daily. 30 Tab 5   ??? lisinopril (PRINIVIL, ZESTRIL) 40 mg tablet Take 1 Tab by mouth daily. 30 Tab 5   ??? pravastatin (PRAVACHOL) 20 mg tablet Take 1 Tab by mouth nightly. 30 Tab 5   ??? metFORMIN (GLUCOPHAGE) 1,000 mg tablet Take 1 Tab by mouth two (2) times daily (with meals). 60 Tab 5    ??? amLODIPine (NORVASC) 10 mg tablet Take 1 Tab by mouth daily. 30 Tab 5   ??? aspirin 81 mg chewable tablet Take 81 mg by mouth daily.     ??? calcium carbonate (OS-CAL) 500 mg calcium (1,250 mg) tablet Take  by mouth daily.     ??? Oxygen        No Known Allergies    Objective:  BP 184/86 mmHg   Pulse 62   Temp(Src) 96.2 ??F (35.7 ??C) (Oral)   Resp 22   Ht  (1.676 m)   Wt 304 lb (137.893 kg)   BMI 49.09 kg/m2   SpO2 91%  Physical Exam:   General appearance - alert, well appearing, in no distress  Neck - supple, no significant adenopathy   Chest - clear to auscultation, no wheezes, rales or rhonchi  Heart - normal rate, regular rhythm, elevated blood pressure  Abdomen - soft, nontender, nondistended, no organomegaly  Ext-peripheral pulses normal, no pedal edema  Neuro -alert, oriented, normal speech,  no focal findings     Results for orders placed or performed in visit on 12/18/14   AMB POC GLUCOSE BLOOD, BY GLUCOSE MONITORING DEVICE   Result Value Ref Range    Glucose POC 84 mg/dL     Assessment/Plan:    ICD-10-CM ICD-9-CM    1. Well controlled type 2 diabetes mellitus (HCC) E11.9 250.00 AMB POC GLUCOSE BLOOD, BY GLUCOSE MONITORING DEVICE   2. Hypertension, uncontrolled I10 401.9 metoprolol succinate (TOPROL-XL) 200 mg XL tablet      isosorbide dinitrate (ISORDIL) 30 mg tablet   3. Hypercholesterolemia E78.0 272.0    4. Morbid obesity, unspecified obesity type (HCC) E66.01 278.01    5. Chronic pain of both knees M25.561 719.46     M25.562 338.29     G89.29       Patient Instructions     DASH Diet: Care Instructions  Your Care Instructions  The DASH diet is an eating plan that can help lower your blood pressure. DASH stands for Dietary Approaches to Stop Hypertension. Hypertension is high blood pressure.  The DASH diet focuses on eating foods that are high in calcium, potassium, and magnesium. These nutrients can lower blood pressure. The foods that are highest in these nutrients are fruits, vegetables, low-fat dairy  products, nuts, seeds, and legumes. But taking calcium, potassium, and magnesium supplements instead of eating foods that are high in those nutrients does not have the same effect. The DASH diet also includes whole grains, fish, and poultry.  The DASH diet is one of several lifestyle changes your doctor may recommend to lower your high blood pressure. Your doctor may also want you to decrease the amount of sodium in your diet. Lowering sodium while following the DASH diet can lower blood pressure even further than just the DASH diet alone.  Follow-up care is a key part of your treatment and safety. Be sure to make and go to all appointments, and call your doctor if you are having problems. It's also a good idea to know your test results and keep a list of the medicines you take.  How can you care for yourself at home?  Following the DASH diet  ?? Eat 4 to 5 servings of fruit each day. A serving is 1 medium-sized piece of fruit, ?? cup chopped or canned fruit, 1/4 cup dried fruit, or 4 ounces (?? cup) of fruit juice. Choose fruit more often than fruit juice.  ?? Eat 4 to 5 servings of vegetables each day. A serving is 1 cup of lettuce or raw leafy vegetables, ?? cup of chopped or cooked vegetables, or 4 ounces (?? cup) of vegetable juice. Choose vegetables more often than vegetable juice.  ?? Get 2 to 3 servings of low-fat and fat-free dairy each day. A serving is 8 ounces of milk, 1 cup of yogurt, or 1 ?? ounces of cheese.  ?? Eat 6 to 8 servings of grains each day. A serving is 1 slice of bread, 1 ounce of dry cereal, or ?? cup of cooked rice, pasta, or cooked cereal. Try to choose whole-grain products as much as possible.  ?? Limit lean meat, poultry, and fish to 2 servings each day. A serving is 3 ounces, about the size of a deck of cards.  ?? Eat 4 to 5 servings of nuts, seeds, and legumes (cooked dried beans, lentils, and split peas) each week. A serving is 1/3 cup of nuts, 2  tablespoons of seeds, or ?? cup of cooked  beans or peas.  ?? Limit fats and oils to 2 to 3 servings each day. A serving is 1 teaspoon of vegetable oil or 2 tablespoons of salad dressing.  ?? Limit sweets and added sugars to 5 servings or less a week. A serving is 1 tablespoon jelly or jam, ?? cup sorbet, or 1 cup of lemonade.  ?? Eat less than 2,300 milligrams (mg) of sodium a day. If you have high blood pressure, diabetes, or chronic kidney disease, if you are African-American, or if you are older than age 66, try to limit the amount of sodium you eat to less than 1,500 mg a day.  Tips for success  ?? Start small. Do not try to make dramatic changes to your diet all at once. You might feel that you are missing out on your favorite foods and then be more likely to not follow the plan. Make small changes, and stick with them. Once those changes become habit, add a few more changes.  ?? Try some of the following:  ?? Make it a goal to eat a fruit or vegetable at every meal and at snacks. This will make it easy to get the recommended amount of fruits and vegetables each day.  ?? Try yogurt topped with fruit and nuts for a snack or healthy dessert.  ?? Add lettuce, tomato, cucumber, and onion to sandwiches.  ?? Combine a ready-made pizza crust with low-fat mozzarella cheese and lots of vegetable toppings. Try using tomatoes, squash, spinach, broccoli, carrots, cauliflower, and onions.  ?? Have a variety of cut-up vegetables with a low-fat dip as an appetizer instead of chips and dip.  ?? Sprinkle sunflower seeds or chopped almonds over salads. Or try adding chopped walnuts or almonds to cooked vegetables.  ?? Try some vegetarian meals using beans and peas. Add garbanzo or kidney beans to salads. Make burritos and tacos with mashed pinto beans or black beans.  Where can you learn more?  Go to [employer]http://www.healthwise.net/[employer]Help[employer]ctions  Enter H967 in the search box to learn more about "DASH Diet: Care Instructions."   ?? 2006-2016 [employer]. Care instructions adapted under license by [employer] (which disclaims liability or warranty for this information). This care instruction is for use with your licensed healthcare professional. If you have questions about a medical condition or this instruction, always ask your healthcare professional. Healthwise, Incorporated disclaims any warranty or liability for your use of this information.  Content Version: 10.9.538570; Current as of: June 28, 2014           Follow-up Disposition:  Return for f/u BP.

## 2014-12-18 NOTE — Progress Notes (Signed)
1. Have you been to the ER, urgent care clinic since your last visit?  Hospitalized since your last visit?No    2. Have you seen or consulted any other health care providers outside of the [facility] since your last visit?  Include any pap smears or colon screening. Yes Where: pt states she saw dr Noel Geroldcohen for her eye exam   Pt states she is here for follow up on diabetes and HTN  Pt also states she is having knee pain bilaterally

## 2014-12-18 NOTE — Patient Instructions (Signed)
DASH Diet: Care Instructions  Your Care Instructions  The DASH diet is an eating plan that can help lower your blood pressure. DASH stands for Dietary Approaches to Stop Hypertension. Hypertension is high blood pressure.  The DASH diet focuses on eating foods that are high in calcium, potassium, and magnesium. These nutrients can lower blood pressure. The foods that are highest in these nutrients are fruits, vegetables, low-fat dairy products, nuts, seeds, and legumes. But taking calcium, potassium, and magnesium supplements instead of eating foods that are high in those nutrients does not have the same effect. The DASH diet also includes whole grains, fish, and poultry.  The DASH diet is one of several lifestyle changes your doctor may recommend to lower your high blood pressure. Your doctor may also want you to decrease the amount of sodium in your diet. Lowering sodium while following the DASH diet can lower blood pressure even further than just the DASH diet alone.  Follow-up care is a key part of your treatment and safety. Be sure to make and go to all appointments, and call your doctor if you are having problems. It's also a good idea to know your test results and keep a list of the medicines you take.  How can you care for yourself at home?  Following the DASH diet  ?? Eat 4 to 5 servings of fruit each day. A serving is 1 medium-sized piece of fruit, ?? cup chopped or canned fruit, 1/4 cup dried fruit, or 4 ounces (?? cup) of fruit juice. Choose fruit more often than fruit juice.  ?? Eat 4 to 5 servings of vegetables each day. A serving is 1 cup of lettuce or raw leafy vegetables, ?? cup of chopped or cooked vegetables, or 4 ounces (?? cup) of vegetable juice. Choose vegetables more often than vegetable juice.  ?? Get 2 to 3 servings of low-fat and fat-free dairy each day. A serving is 8 ounces of milk, 1 cup of yogurt, or 1 ?? ounces of cheese.   ?? Eat 6 to 8 servings of grains each day. A serving is 1 slice of bread, 1 ounce of dry cereal, or ?? cup of cooked rice, pasta, or cooked cereal. Try to choose whole-grain products as much as possible.  ?? Limit lean meat, poultry, and fish to 2 servings each day. A serving is 3 ounces, about the size of a deck of cards.  ?? Eat 4 to 5 servings of nuts, seeds, and legumes (cooked dried beans, lentils, and split peas) each week. A serving is 1/3 cup of nuts, 2 tablespoons of seeds, or ?? cup of cooked beans or peas.  ?? Limit fats and oils to 2 to 3 servings each day. A serving is 1 teaspoon of vegetable oil or 2 tablespoons of salad dressing.  ?? Limit sweets and added sugars to 5 servings or less a week. A serving is 1 tablespoon jelly or jam, ?? cup sorbet, or 1 cup of lemonade.  ?? Eat less than 2,300 milligrams (mg) of sodium a day. If you have high blood pressure, diabetes, or chronic kidney disease, if you are African-American, or if you are older than age 66, try to limit the amount of sodium you eat to less than 1,500 mg a day.  Tips for success  ?? Start small. Do not try to make dramatic changes to your diet all at once. You might feel that you are missing out on your favorite foods and then be more likely   to not follow the plan. Make small changes, and stick with them. Once those changes become habit, add a few more changes.  ?? Try some of the following:  ?? Make it a goal to eat a fruit or vegetable at every meal and at snacks. This will make it easy to get the recommended amount of fruits and vegetables each day.  ?? Try yogurt topped with fruit and nuts for a snack or healthy dessert.  ?? Add lettuce, tomato, cucumber, and onion to sandwiches.  ?? Combine a ready-made pizza crust with low-fat mozzarella cheese and lots of vegetable toppings. Try using tomatoes, squash, spinach, broccoli, carrots, cauliflower, and onions.  ?? Have a variety of cut-up vegetables with a low-fat dip as an appetizer  instead of chips and dip.  ?? Sprinkle sunflower seeds or chopped almonds over salads. Or try adding chopped walnuts or almonds to cooked vegetables.  ?? Try some vegetarian meals using beans and peas. Add garbanzo or kidney beans to salads. Make burritos and tacos with mashed pinto beans or black beans.  Where can you learn more?  Go to http://www.healthwise.net/GoodHelpConnections  Enter H967 in the search box to learn more about "DASH Diet: Care Instructions."  ?? 2006-2016 [employer]. Care instructions adapted under license by [employer] (which disclaims liability or warranty for this information). This care instruction is for use with your licensed healthcare professional. If you have questions about a medical condition or this instruction, always ask your healthcare professional. Healthwise, [employer] disclaims any warranty or liability for your use of this information.  Content Version: 10.9.538570; Current as of: June 28, 2014

## 2015-01-15 ENCOUNTER — Ambulatory Visit: Admit: 2015-01-15 | Discharge: 2015-01-15 | Payer: MEDICARE | Attending: Internal Medicine | Primary: Internal Medicine

## 2015-01-15 DIAGNOSIS — E119 Type 2 diabetes mellitus without complications: Secondary | ICD-10-CM

## 2015-01-15 LAB — AMB POC HEMOGLOBIN A1C: Hemoglobin A1c (POC): 5.5 %

## 2015-01-15 LAB — AMB POC GLUCOSE BLOOD, BY GLUCOSE MONITORING DEVICE: Glucose POC: 120 mg/dL

## 2015-01-15 MED ORDER — AMLODIPINE 10 MG TAB
10 mg | ORAL_TABLET | Freq: Every day | ORAL | 5 refills | Status: DC
Start: 2015-01-15 — End: 2015-02-20

## 2015-01-15 NOTE — Patient Instructions (Addendum)
DASH Diet: Care Instructions  Your Care Instructions  The DASH diet is an eating plan that can help lower your blood pressure. DASH stands for Dietary Approaches to Stop Hypertension. Hypertension is high blood pressure.  The DASH diet focuses on eating foods that are high in calcium, potassium, and magnesium. These nutrients can lower blood pressure. The foods that are highest in these nutrients are fruits, vegetables, low-fat dairy products, nuts, seeds, and legumes. But taking calcium, potassium, and magnesium supplements instead of eating foods that are high in those nutrients does not have the same effect. The DASH diet also includes whole grains, fish, and poultry.  The DASH diet is one of several lifestyle changes your doctor may recommend to lower your high blood pressure. Your doctor may also want you to decrease the amount of sodium in your diet. Lowering sodium while following the DASH diet can lower blood pressure even further than just the DASH diet alone.  Follow-up care is a key part of your treatment and safety. Be sure to make and go to all appointments, and call your doctor if you are having problems. It's also a good idea to know your test results and keep a list of the medicines you take.  How can you care for yourself at home?  Following the DASH diet  ?? Eat 4 to 5 servings of fruit each day. A serving is 1 medium-sized piece of fruit, ?? cup chopped or canned fruit, 1/4 cup dried fruit, or 4 ounces (?? cup) of fruit juice. Choose fruit more often than fruit juice.  ?? Eat 4 to 5 servings of vegetables each day. A serving is 1 cup of lettuce or raw leafy vegetables, ?? cup of chopped or cooked vegetables, or 4 ounces (?? cup) of vegetable juice. Choose vegetables more often than vegetable juice.  ?? Get 2 to 3 servings of low-fat and fat-free dairy each day. A serving is 8 ounces of milk, 1 cup of yogurt, or 1 ?? ounces of cheese.   ?? Eat 6 to 8 servings of grains each day. A serving is 1 slice of bread, 1 ounce of dry cereal, or ?? cup of cooked rice, pasta, or cooked cereal. Try to choose whole-grain products as much as possible.  ?? Limit lean meat, poultry, and fish to 2 servings each day. A serving is 3 ounces, about the size of a deck of cards.  ?? Eat 4 to 5 servings of nuts, seeds, and legumes (cooked dried beans, lentils, and split peas) each week. A serving is 1/3 cup of nuts, 2 tablespoons of seeds, or ?? cup of cooked beans or peas.  ?? Limit fats and oils to 2 to 3 servings each day. A serving is 1 teaspoon of vegetable oil or 2 tablespoons of salad dressing.  ?? Limit sweets and added sugars to 5 servings or less a week. A serving is 1 tablespoon jelly or jam, ?? cup sorbet, or 1 cup of lemonade.  ?? Eat less than 2,300 milligrams (mg) of sodium a day. If you have high blood pressure, diabetes, or chronic kidney disease, if you are African-American, or if you are older than age 66, try to limit the amount of sodium you eat to less than 1,500 mg a day.  Tips for success  ?? Start small. Do not try to make dramatic changes to your diet all at once. You might feel that you are missing out on your favorite foods and then be more likely   to not follow the plan. Make small changes, and stick with them. Once those changes become habit, add a few more changes.  ?? Try some of the following:  ?? Make it a goal to eat a fruit or vegetable at every meal and at snacks. This will make it easy to get the recommended amount of fruits and vegetables each day.  ?? Try yogurt topped with fruit and nuts for a snack or healthy dessert.  ?? Add lettuce, tomato, cucumber, and onion to sandwiches.  ?? Combine a ready-made pizza crust with low-fat mozzarella cheese and lots of vegetable toppings. Try using tomatoes, squash, spinach, broccoli, carrots, cauliflower, and onions.  ?? Have a variety of cut-up vegetables with a low-fat dip as an appetizer  instead of chips and dip.  ?? Sprinkle sunflower seeds or chopped almonds over salads. Or try adding chopped walnuts or almonds to cooked vegetables.  ?? Try some vegetarian meals using beans and peas. Add garbanzo or kidney beans to salads. Make burritos and tacos with mashed pinto beans or black beans.  Where can you learn more?  Go to http://www.healthwise.net/GoodHelpConnections  Enter H967 in the search box to learn more about "DASH Diet: Care Instructions."  ?? 2006-2016 [employer]. Care instructions adapted under license by [employer] (which disclaims liability or warranty for this information). This care instruction is for use with your licensed healthcare professional. If you have questions about a medical condition or this instruction, always ask your healthcare professional. Healthwise, Inc[employer] disclaims any warranty or liability for your use of this information.  Content Version: 10.9.538570; Current as of: January 66, 2016

## 2015-01-15 NOTE — Progress Notes (Signed)
1. Have you been to the ER, urgent care clinic since your last visit?  Hospitalized since your last visit?No    2. Have you seen or consulted any other health care providers outside of the [facility] since your last visit?  Include any pap smears or colon screening. No      Pt states she is here for follow up on HTN

## 2015-01-15 NOTE — Progress Notes (Signed)
Chief Complaint   Patient presents with   ??? Hypertension     HPI:  Brittney Shelton is a 64 y.o. female AA female presenting to follow up on Diabetes, Hypertension  Blood pressure is elevated on metoprolol 200 mg, Lisinopril 40 mg, Furosemide 40 mg, Isosorbide DN 30 mg 3 times a day,   Amlodipine 5 mg daily and Clonidine 0.1mg  daily. She is taking medications as instructed, no medication side effects noted,   Blood pressure reading is better than last. Plan: increase amlodipine to 10 mg daily.     Review of Systems  As per hpi    Past Medical History   Diagnosis Date   ??? Chronic obstructive pulmonary disease (HCC)    ??? Congestive heart failure (HCC)    ??? Diabetes (HCC)    ??? Hypertension      History reviewed. No pertinent past surgical history.  Social History     Social History   ??? Marital status: SINGLE     Spouse name: N/A   ??? Number of children: N/A   ??? Years of education: N/A     Social History Main Topics   ??? Smoking status: Never Smoker   ??? Smokeless tobacco: Never Used   ??? Alcohol use No   ??? Drug use: No   ??? Sexual activity: No     Other Topics Concern   ??? None     Social History Narrative     Family History   Problem Relation Age of Onset   ??? Diabetes Mother    ??? Arthritis-osteo Father    ??? COPD Sister    ??? Diabetes Brother    ??? Diabetes Brother      Current Outpatient Prescriptions   Medication Sig Dispense Refill   ??? metoprolol succinate (TOPROL-XL) 200 mg XL tablet Take 1 Tab by mouth daily. Indications: HYPERTENSION 30 Tab 5   ??? isosorbide dinitrate (ISORDIL) 30 mg tablet Take 1 Tab by mouth three (3) times daily. 90 Tab 5   ??? furosemide (LASIX) 40 mg tablet Take 40 mg by mouth two (2) times a day. Take three tablets twice a day.     ??? glimepiride (AMARYL) 4 mg tablet Take 1 Tab by mouth every morning. 30 Tab 5   ??? cloNIDine HCl (CATAPRES) 0.1 mg tablet Take 1 Tab by mouth daily. 30 Tab 5   ??? lisinopril (PRINIVIL, ZESTRIL) 40 mg tablet Take 1 Tab by mouth daily. 30 Tab 5    ??? pravastatin (PRAVACHOL) 20 mg tablet Take 1 Tab by mouth nightly. 30 Tab 5   ??? metFORMIN (GLUCOPHAGE) 1,000 mg tablet Take 1 Tab by mouth two (2) times daily (with meals). 60 Tab 5   ??? amLODIPine (NORVASC) 10 mg tablet Take 1 Tab by mouth daily. 30 Tab 5   ??? aspirin 81 mg chewable tablet Take 81 mg by mouth daily.     ??? calcium carbonate (OS-CAL) 500 mg calcium (1,250 mg) tablet Take  by mouth daily.     ??? Oxygen        No Known Allergies    Objective:  Visit Vitals   ??? BP 158/79 (BP 1 Location: Left arm, BP Patient Position: Sitting)   ??? Pulse 71   ??? Temp 96.4 ??F (35.8 ??C) (Oral)   ??? Resp 18   ??? Ht  (1.676 m)   ??? Wt 305 lb (138.3 kg)   ??? SpO2 93%  Comment: 4 liter nasal o2   ???  BMI 49.23 kg/m2     Physical Exam:   General appearance - alert, well appearing, in no distress  Mental status - alert, oriented to person, place, and time  EYE-PERRL, EOMI  Neck - supple, no significant adenopathy   Chest - clear to auscultation, no wheezes, rales or rhonchi  Heart - normal rate, regular rhythm, normal   Abdomen - soft, nontender, nondistended, no organomegaly  Ext-peripheral pulses normal, no pedal edema  Neuro -alert, oriented, normal speech, no focal findings    Results for orders placed or performed in visit on 01/15/15   AMB POC HEMOGLOBIN A1C   Result Value Ref Range    Hemoglobin A1c (POC) 5.5 %   AMB POC GLUCOSE BLOOD, BY GLUCOSE MONITORING DEVICE   Result Value Ref Range    Glucose POC 120 mg/dL     Assessment/Plan:    ICD-10-CM ICD-9-CM    1. Well controlled type 2 diabetes mellitus (HCC) E11.9 250.00 AMB POC HEMOGLOBIN A1C      AMB POC GLUCOSE BLOOD, BY GLUCOSE MONITORING DEVICE   2. Hypertension, uncontrolled I10 401.9 amLODIPine (NORVASC) 10 mg tablet     Patient Instructions     DASH Diet: Care Instructions  Your Care Instructions  The DASH diet is an eating plan that can help lower your blood pressure. DASH stands for Dietary Approaches to Stop Hypertension. Hypertension is high blood pressure.   The DASH diet focuses on eating foods that are high in calcium, potassium, and magnesium. These nutrients can lower blood pressure. The foods that are highest in these nutrients are fruits, vegetables, low-fat dairy products, nuts, seeds, and legumes. But taking calcium, potassium, and magnesium supplements instead of eating foods that are high in those nutrients does not have the same effect. The DASH diet also includes whole grains, fish, and poultry.  The DASH diet is one of several lifestyle changes your doctor may recommend to lower your high blood pressure. Your doctor may also want you to decrease the amount of sodium in your diet. Lowering sodium while following the DASH diet can lower blood pressure even further than just the DASH diet alone.  Follow-up care is a key part of your treatment and safety. Be sure to make and go to all appointments, and call your doctor if you are having problems. It's also a good idea to know your test results and keep a list of the medicines you take.  How can you care for yourself at home?  Following the DASH diet  ?? Eat 4 to 5 servings of fruit each day. A serving is 1 medium-sized piece of fruit, ?? cup chopped or canned fruit, 1/4 cup dried fruit, or 4 ounces (?? cup) of fruit juice. Choose fruit more often than fruit juice.  ?? Eat 4 to 5 servings of vegetables each day. A serving is 1 cup of lettuce or raw leafy vegetables, ?? cup of chopped or cooked vegetables, or 4 ounces (?? cup) of vegetable juice. Choose vegetables more often than vegetable juice.  ?? Get 2 to 3 servings of low-fat and fat-free dairy each day. A serving is 8 ounces of milk, 1 cup of yogurt, or 1 ?? ounces of cheese.  ?? Eat 6 to 8 servings of grains each day. A serving is 1 slice of bread, 1 ounce of dry cereal, or ?? cup of cooked rice, pasta, or cooked cereal. Try to choose whole-grain products as much as possible.  ?? Limit lean meat, poultry, and fish  to 2 servings each day. A serving is  3 ounces, about the size of a deck of cards.  ?? Eat 4 to 5 servings of nuts, seeds, and legumes (cooked dried beans, lentils, and split peas) each week. A serving is 1/3 cup of nuts, 2 tablespoons of seeds, or ?? cup of cooked beans or peas.  ?? Limit fats and oils to 2 to 3 servings each day. A serving is 1 teaspoon of vegetable oil or 2 tablespoons of salad dressing.  ?? Limit sweets and added sugars to 5 servings or less a week. A serving is 1 tablespoon jelly or jam, ?? cup sorbet, or 1 cup of lemonade.  ?? Eat less than 2,300 milligrams (mg) of sodium a day. If you have high blood pressure, diabetes, or chronic kidney disease, if you are African-American, or if you are older than age 74, try to limit the amount of sodium you eat to less than 1,500 mg a day.  Tips for success  ?? Start small. Do not try to make dramatic changes to your diet all at once. You might feel that you are missing out on your favorite foods and then be more likely to not follow the plan. Make small changes, and stick with them. Once those changes become habit, add a few more changes.  ?? Try some of the following:  ?? Make it a goal to eat a fruit or vegetable at every meal and at snacks. This will make it easy to get the recommended amount of fruits and vegetables each day.  ?? Try yogurt topped with fruit and nuts for a snack or healthy dessert.  ?? Add lettuce, tomato, cucumber, and onion to sandwiches.  ?? Combine a ready-made pizza crust with low-fat mozzarella cheese and lots of vegetable toppings. Try using tomatoes, squash, spinach, broccoli, carrots, cauliflower, and onions.  ?? Have a variety of cut-up vegetables with a low-fat dip as an appetizer instead of chips and dip.  ?? Sprinkle sunflower seeds or chopped almonds over salads. Or try adding chopped walnuts or almonds to cooked vegetables.  ?? Try some vegetarian meals using beans and peas. Add garbanzo or kidney beans to salads. Make burritos and tacos with mashed pinto beans or black  beans.  Where can you learn more?  Go to [employer]  Enter H967 in the search box to learn more about "DASH Diet: Care Instructions."  ?? 2006-2016 [employer]. Care instructions adapted under license by [employer] (which disclaims liability or warranty for this information). This care instruction is for use with your licensed healthcare professional. If you have questions about a medical condition or this instruction, always ask your healthcare professional. [employer] disclaims any warranty or liability for your use of this information.  Content Version: 10.9.538570; Current as of: June 28, 2014          Follow-up Disposition:  Return in about 4 weeks (around 02/12/2015) for follow up blood pressure.

## 2015-02-15 ENCOUNTER — Ambulatory Visit: Admit: 2015-02-15 | Discharge: 2015-02-15 | Payer: MEDICARE | Attending: Internal Medicine | Primary: Internal Medicine

## 2015-02-15 DIAGNOSIS — Z0000 Encounter for general adult medical examination without abnormal findings: Secondary | ICD-10-CM

## 2015-02-15 MED ORDER — PROMETHAZINE-CODEINE 6.25 MG-10 MG/5 ML SYRUP
Freq: Four times a day (QID) | ORAL | 0 refills | Status: DC | PRN
Start: 2015-02-15 — End: 2015-03-15

## 2015-02-15 NOTE — Patient Instructions (Addendum)
Osteoporosis: Care Instructions  Your Care Instructions     Osteoporosis causes bones to become thin and weak. It is much more common in women than in men. Osteoporosis may be very advanced before you know you have it. Sometimes the first sign is a broken bone in the hip, spine, or wrist or sudden pain in your middle or lower back.  Follow-up care is a key part of your treatment and safety. Be sure to make and go to all appointments, and call your doctor if you are having problems. It???s also a good idea to know your test results and keep a list of the medicines you take.  How can you care for yourself at home?  ?? Your doctor may prescribe a bisphosphonate, such as risedronate (Actonel) or alendronate (Fosamax), for osteoporosis. If you are taking one of these medicines by mouth:  ?? Take your medicine with a full glass of water when you first get up in the morning.  ?? Do not lie down, eat, drink a beverage, or take any other medicine for at least 30 minutes after taking the drug. This helps prevent stomach problems.  ?? Do not take your medicine late in the day if you forgot to take it in the morning. Skip it, and take the usual dose the next morning.  ?? If you have side effects, tell your doctor. He or she may prescribe another medicine.  ?? Get enough calcium and vitamin D. The Institute of Medicine recommends adults younger than age 51 need 1,000 mg of calcium and 600 IU of vitamin D each day. Women ages 51 to 70 need 1,200 mg of calcium and 600 IU of vitamin D each day. Men ages 51 to 70 need 1,000 mg of calcium and 600 IU of vitamin D each day. Adults 71 and older need 1,200 mg of calcium and 800 IU of vitamin D each day..  ?? Eat foods rich in calcium, like yogurt, cheese, milk, and dark green vegetables. This is a good way to get the calcium you need. You can get vitamin D from eggs, fatty fish, cereal, and milk.  ?? Talk to your doctor about taking a calcium plus vitamin D supplement. Be  careful, though. Adults ages 19 to 50 should not get more than 2,500 mg of calcium and 4,000 IU of vitamin D each day, whether it is from supplements and/or food. Adults ages 51 and older should not get more than 2,000 mg of calcium and 4,000 IU of vitamin D each day from supplements and/or food.  ?? Limit alcohol to 2 drinks a day for men and 1 drink a day for women. Too much alcohol can cause health problems.  ?? Do not smoke. Smoking puts you at a much higher risk for osteoporosis. If you need help quitting, talk to your doctor about stop-smoking programs and medicines. These can increase your chances of quitting for good.  ?? Get regular bone-building exercise. Weight-bearing and resistance exercises keep bones healthy by working the muscles and bones against gravity. Start out at an exercise level that feels right for you. Add a little at a time until you can do the following:  ?? Do 30 minutes of weight-bearing exercise on most days of the week. Walking, jogging, stair climbing, and dancing are good choices.  ?? Do resistance exercises with weights or elastic bands 2 to 3 days a week.  ?? Reduce your risk of falls:  ?? Wear supportive shoes with low heels and   nonslip soles.  ?? Use a cane or walker, if you need it. Use shower chairs and bath benches. Put in handrails on stairways, around your shower or tub area, and near the toilet.  ?? Keep stairs, porches, and walkways well lit. Use night-lights.  ?? Remove throw rugs and other objects that are in the way.  ?? Avoid icy, wet, or slippery surfaces.  ?? Keep a cordless phone and a flashlight with new batteries by your bed.  When should you call for help?  Call your doctor now or seek immediate medical care if:  ?? You think you have broken a bone, have pain and swelling after a fall, or cannot move a part of your body.  ?? You have sudden, severe pain when you stand or walk.  Watch closely for changes in your health, and be sure to contact your  doctor if you have any problems.  Where can you learn more?  Go to http://www.healthwise.net/[employer]  Enter K100 in the search box to learn more about "Osteoporosis: Care Instructions."  ?? 2006-2016 [employer]. Care instructions adapted under license by [employer] (which disclaims liability or warranty for this information). This care instruction is for use with your licensed healthcare professional. If you have questions about a medical condition or this instruction, always ask your healthcare professional. [employer] disclaims any warranty or liability for your use of this information.  Content Version: 10.9.538570; Current as of: April 21, 2014

## 2015-02-15 NOTE — Progress Notes (Signed)
Chief Complaint   Patient presents with   ??? Hypertension   ??? Diabetes     HPI:  Brittney Shelton is a 64 y.o. AA female with copd, on home oxygen, hypertension, diabetes type 2, well controlled. Present to follow up 3 months follow up and annual wellness visit.  ??  Blood pressure reading is better than last, she is compliant with treatment.  Patient has no complaints.    Review of Systems  As per hpi    Past Medical History   Diagnosis Date   ??? Chronic obstructive pulmonary disease (HCC)    ??? Congestive heart failure (HCC)    ??? Diabetes (HCC)    ??? Hypertension      History reviewed. No pertinent past surgical history.  Social History     Social History   ??? Marital status: SINGLE     Spouse name: N/A   ??? Number of children: N/A   ??? Years of education: N/A     Social History Main Topics   ??? Smoking status: Never Smoker   ??? Smokeless tobacco: Never Used   ??? Alcohol use No   ??? Drug use: No   ??? Sexual activity: No     Current Outpatient Prescriptions   Medication Sig Dispense Refill   ??? amLODIPine (NORVASC) 10 mg tablet Take 1 Tab by mouth daily. 30 Tab 5   ??? metoprolol succinate (TOPROL-XL) 200 mg XL tablet Take 1 Tab by mouth daily. Indications: HYPERTENSION 30 Tab 5   ??? isosorbide dinitrate (ISORDIL) 30 mg tablet Take 1 Tab by mouth three (3) times daily. 90 Tab 5   ??? furosemide (LASIX) 40 mg tablet Take 40 mg by mouth two (2) times a day. Take three tablets twice a day.     ??? glimepiride (AMARYL) 4 mg tablet Take 1 Tab by mouth every morning. 30 Tab 5   ??? cloNIDine HCl (CATAPRES) 0.1 mg tablet Take 1 Tab by mouth daily. 30 Tab 5   ??? lisinopril (PRINIVIL, ZESTRIL) 40 mg tablet Take 1 Tab by mouth daily. 30 Tab 5   ??? pravastatin (PRAVACHOL) 20 mg tablet Take 1 Tab by mouth nightly. 30 Tab 5   ??? metFORMIN (GLUCOPHAGE) 1,000 mg tablet Take 1 Tab by mouth two (2) times daily (with meals). 60 Tab 5   ??? aspirin 81 mg chewable tablet Take 81 mg by mouth daily.      ??? calcium carbonate (OS-CAL) 500 mg calcium (1,250 mg) tablet Take  by mouth daily.     ??? Oxygen        No Known Allergies    Objective:  Visit Vitals   ??? BP 146/70 (BP 1 Location: Left arm, BP Patient Position: Sitting)   ??? Pulse 65   ??? Temp 96.6 ??F (35.9 ??C) (Oral)   ??? Resp 20   ??? Ht 5\' 6"  (1.676 m)   ??? Wt 305 lb 9.6 oz (138.6 kg)   ??? SpO2 96%  Comment: 4 liters   ??? BMI 49.33 kg/m2     Physical Exam:   General appearance - alert, well appearing, in no distress  Mental status - alert, oriented to person, place, and time  EYE-PERRL, EOMI  Neck - supple, no significant adenopathy   Chest - clear to auscultation, no wheezes, rales or rhonchi   Heart - normal rate, regular rhythm, mild BP elevation noted   Abdomen - soft, nontender, nondistended, no organomegaly  Ext-peripheral pulses normal, bilateral lymphedema  Neuro -  no focal findings    Results for orders placed or performed in visit on 01/15/15   AMB POC HEMOGLOBIN A1C   Result Value Ref Range    Hemoglobin A1c (POC) 5.5 %   AMB POC GLUCOSE BLOOD, BY GLUCOSE MONITORING DEVICE   Result Value Ref Range    Glucose POC 120 mg/dL     Assessment/Plan:    ICD-10-CM ICD-9-CM    1. Encounter for Medicare annual wellness exam Z00.00 V70.0 METABOLIC PANEL, COMPREHENSIVE      CBC W/O DIFF      UA/M W/RFLX CULTURE, ROUTINE   2. Well controlled type 2 diabetes mellitus (HCC) E11.9 250.00 METABOLIC PANEL, COMPREHENSIVE   3. Breast cancer screening Z12.39 V76.10 MAM MAMMO BI SCREENING DIGTL      CANCELED: MAM MAMMO BI SCREENING DIGTL   4. Hypercholesterolemia E78.0 272.0 LIPID PANEL   5. 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 Hypertension, well controlled I10 401.9 METABOLIC PANEL, COMPREHENSIVE   8. Encounter for vitamin deficiency screening Z13.21 V77.99 VITAMIN D, 25 HYDROXY    9. Screening for thyroid disorder Z13.29 V77.0 TSH 3RD GENERATION   10. Osteoporosis M81.0 733.00 DEXA BONE DENSITY STUDY AXIAL     Patient Instructions     Osteoporosis: Care Instructions  Your Care Instructions     Osteoporosis causes bones to become thin and weak. It is much more common in women than in men. Osteoporosis may be very advanced before you know you have it. Sometimes the first sign is a broken bone in the hip, spine, or wrist or sudden pain in your middle or lower back.  Follow-up care is a key part of your treatment and safety. Be sure to make and go to all appointments, and call your doctor if you are having problems. It???s also a good idea to know your test results and keep a list of the medicines you take.  How can you care for yourself at home?  ?? Your doctor may prescribe a bisphosphonate, such as risedronate (Actonel) or alendronate (Fosamax), for osteoporosis. If you are taking one of these medicines by mouth:  ?? Take your medicine with a full glass of water when you first get up in the morning.  ?? Do not lie down, eat, drink a beverage, or take any other medicine for at least 30 minutes after taking the drug. This helps prevent stomach problems.  ?? Do not take your medicine late in the day if you forgot to take it in the morning. Skip it, and take the usual dose the next morning.  ?? If you have side effects, tell your doctor. He or she may prescribe another medicine.  ?? Get enough calcium and vitamin D. The Institute of Medicine recommends adults younger than age 22 need 1,000 mg of calcium and 600 IU of vitamin D each day. Women ages 48 to 27 need 1,200 mg of calcium and 600 IU of vitamin D each day. Men ages 43 to 3 need 1,000 mg of calcium and 600 IU of vitamin D each day. Adults 71 and older need 1,200 mg of calcium and 800 IU of vitamin D each day..  ?? Eat foods rich in calcium, like yogurt, cheese, milk, and dark green  vegetables. This is a good way to get the calcium you need. You can  get vitamin D from eggs, fatty fish, cereal, and milk.  ?? Talk to your doctor about taking a calcium plus vitamin D supplement. Be careful, though. Adults ages 61 to 68 should not get more than 2,500 mg of calcium and 4,000 IU of vitamin D each day, whether it is from supplements and/or food. Adults ages 16 and older should not get more than 2,000 mg of calcium and 4,000 IU of vitamin D each day from supplements and/or food.  ?? Limit alcohol to 2 drinks a day for men and 1 drink a day for women. Too much alcohol can cause health problems.  ?? Do not smoke. Smoking puts you at a much higher risk for osteoporosis. If you need help quitting, talk to your doctor about stop-smoking programs and medicines. These can increase your chances of quitting for good.  ?? Get regular bone-building exercise. Weight-bearing and resistance exercises keep bones healthy by working the muscles and bones against gravity. Start out at an exercise level that feels right for you. Add a little at a time until you can do the following:  ?? Do 30 minutes of weight-bearing exercise on most days of the week. Walking, jogging, stair climbing, and dancing are good choices.  ?? Do resistance exercises with weights or elastic bands 2 to 3 days a week.  ?? Reduce your risk of falls:  ?? Wear supportive shoes with low heels and nonslip soles.  ?? Use a cane or walker, if you need it. Use shower chairs and bath benches. Put in handrails on stairways, around your shower or tub area, and near the toilet.  ?? Keep stairs, porches, and walkways well lit. Use night-lights.  ?? Remove throw rugs and other objects that are in the way.  ?? Avoid icy, wet, or slippery surfaces.  ?? Keep a cordless phone and a flashlight with new batteries by your bed.  When should you call for help?  Call your doctor now or seek immediate medical care if:   ?? You think you have broken a bone, have pain and swelling after a fall, or cannot move a part of your body.  ?? You have sudden, severe pain when you stand or walk.  Watch closely for changes in your health, and be sure to contact your doctor if you have any problems.  Where can you learn more?  Go to [employer]  Enter K100 in the search box to learn more about "Osteoporosis: Care Instructions."  ?? 2006-2016 [employer]. Care instructions adapted under license by [employer] (which disclaims liability or warranty for this information). This care instruction is for use with your licensed healthcare professional. If you have questions about a medical condition or this instruction, always ask your healthcare professional. [employer] disclaims any warranty or liability for your use of this information.  Content Version: 10.9.538570; Current as of: April 21, 2014          Follow-up Disposition:  Return 2-3 weeks, for f/u cough and results.

## 2015-02-15 NOTE — Progress Notes (Signed)
1. Have you been to the ER, urgent care clinic since your last visit?  Hospitalized since your last visit?No    2. Have you seen or consulted any other health care providers outside of the [facility] since your last visit?  Include any pap smears or colon screening. No     Pt states she is here for follow up

## 2015-02-16 ENCOUNTER — Encounter

## 2015-02-16 LAB — METABOLIC PANEL, COMPREHENSIVE
A-G Ratio: 1.2 (ref 1.1–2.5)
ALT (SGPT): 9 IU/L (ref 0–32)
AST (SGOT): 17 IU/L (ref 0–40)
Albumin: 4 g/dL (ref 3.6–4.8)
Alk. phosphatase: 73 IU/L (ref 39–117)
BUN/Creatinine ratio: 28 — ABNORMAL HIGH (ref 11–26)
BUN: 24 mg/dL (ref 8–27)
Bilirubin, total: 0.8 mg/dL (ref 0.0–1.2)
CO2: 31 mmol/L — ABNORMAL HIGH (ref 18–29)
Calcium: 9.3 mg/dL (ref 8.7–10.3)
Chloride: 96 mmol/L — ABNORMAL LOW (ref 97–108)
Creatinine: 0.87 mg/dL (ref 0.57–1.00)
GFR est AA: 80 mL/min/{1.73_m2} (ref >59)
GFR est non-AA: 70 mL/min/{1.73_m2} (ref >59)
GLOBULIN, TOTAL: 3.4 g/dL (ref 1.5–4.5)
Glucose: 94 mg/dL (ref 65–99)
Potassium: 4.3 mmol/L (ref 3.5–5.2)
Protein, total: 7.4 g/dL (ref 6.0–8.5)
Sodium: 142 mmol/L (ref 134–144)

## 2015-02-16 LAB — LIPID PANEL
Cholesterol, total: 191 mg/dL (ref 100–199)
HDL Cholesterol: 44 mg/dL (ref >39)
LDL, calculated: 126 mg/dL — ABNORMAL HIGH (ref 0–99)
Triglyceride: 107 mg/dL (ref 0–149)
VLDL, calculated: 21 mg/dL (ref 5–40)

## 2015-02-16 LAB — UA/M W/RFLX CULTURE, ROUTINE

## 2015-02-16 LAB — CBC W/O DIFF
HCT: 34.9 % (ref 34.0–46.6)
HGB: 11.7 g/dL (ref 11.1–15.9)
MCH: 30.5 pg (ref 26.6–33.0)
MCHC: 33.5 g/dL (ref 31.5–35.7)
MCV: 91 fL (ref 79–97)
PLATELET: 252 10*3/uL (ref 150–379)
RBC: 3.83 x10E6/uL (ref 3.77–5.28)
RDW: 15.2 % (ref 12.3–15.4)
WBC: 8.7 10*3/uL (ref 3.4–10.8)

## 2015-02-16 LAB — TSH 3RD GENERATION: TSH: 3.31 u[IU]/mL (ref 0.450–4.500)

## 2015-02-16 LAB — VITAMIN D, 25 HYDROXY: VITAMIN D, 25-HYDROXY: 31.1 ng/mL (ref 30.0–100.0)

## 2015-02-18 ENCOUNTER — Encounter

## 2015-02-19 MED ORDER — ISOSORBIDE DINITRATE 30 MG TAB
30 mg | ORAL_TABLET | ORAL | 5 refills | Status: DC
Start: 2015-02-19 — End: 2016-05-11

## 2015-02-19 MED ORDER — LISINOPRIL 40 MG TAB
40 mg | ORAL_TABLET | ORAL | 5 refills | Status: DC
Start: 2015-02-19 — End: 2016-05-11

## 2015-02-19 MED ORDER — METFORMIN 1,000 MG TAB
1000 mg | ORAL_TABLET | ORAL | 5 refills | Status: DC
Start: 2015-02-19 — End: 2015-08-01

## 2015-02-19 MED ORDER — CLONIDINE 0.1 MG TAB
0.1 mg | ORAL_TABLET | ORAL | 5 refills | Status: DC
Start: 2015-02-19 — End: 2016-01-27

## 2015-02-19 MED ORDER — METOPROLOL SUCCINATE SR 200 MG 24 HR TAB
200 mg | ORAL_TABLET | ORAL | 5 refills | Status: DC
Start: 2015-02-19 — End: 2016-02-21

## 2015-02-19 MED ORDER — GLIMEPIRIDE 4 MG TAB
4 mg | ORAL_TABLET | ORAL | 5 refills | Status: DC
Start: 2015-02-19 — End: 2015-09-26

## 2015-02-20 ENCOUNTER — Encounter

## 2015-02-21 MED ORDER — AMLODIPINE 10 MG TAB
10 mg | ORAL_TABLET | Freq: Every day | ORAL | 1 refills | Status: DC
Start: 2015-02-21 — End: 2015-08-01

## 2015-02-28 NOTE — Telephone Encounter (Signed)
Pt would like a return call to discuss a knee brace   Pt ph number is [phone]

## 2015-02-28 NOTE — Telephone Encounter (Signed)
Call patient, line busy. Will call back

## 2015-03-01 NOTE — Telephone Encounter (Signed)
Spoke with patient. Patient states she doesn't know the name of the company. States someone called her and ask her if she want to try a knee brace because she has arthritis. Patient states to disregard the request if it didn't come from dr domah

## 2015-03-15 ENCOUNTER — Ambulatory Visit: Admit: 2015-03-15 | Discharge: 2015-03-15 | Payer: MEDICARE | Attending: Internal Medicine | Primary: Internal Medicine

## 2015-03-15 DIAGNOSIS — I10 Essential (primary) hypertension: Secondary | ICD-10-CM

## 2015-03-15 NOTE — Progress Notes (Signed)
Chief Complaint   Patient presents with   ??? Hypertension     HPI:  Brittney Shelton is a 64 y.o. AA female with copd, on home oxygen, hypertension, diabetes type 2, well controlled presenting to follow up hypertension. Blood pressure reading is better than last,   she is compliant with treatment. Patient has no complaints.    Note blood pressure medications:  ?? 1. Lisinopril 40 mg: 1 tab daily  ?? 2. Metoprolol ER 200 mg: 1 tab daily  ?? 3. Furosemide 40 mg: 1 tabs 2  times a day  ?? 4. Isosorbide DN 30 mg: 1tab 3 time a day  ?? 5. Amlodipine 10 mg: take 1 tab a day  ?? 6. Clonidine 0.52m daily??     Review of Systems  As per hpi    Past Medical History   Diagnosis Date   ??? Chronic obstructive pulmonary disease ([place]    ??? Congestive heart failure ([place]    ??? Diabetes ([place]    ??? Hypertension      Current Outpatient Prescriptions   Medication Sig Dispense Refill   ??? amLODIPine (NORVASC) 10 mg tablet Take 1 Tab by mouth daily. 90 Tab 1   ??? metoprolol succinate (TOPROL-XL) 200 mg XL tablet TAKE 1 TABLET BY MOUTH DAILY FOR HIGH BLOOD PRESSURE 90 Tab 5   ??? metFORMIN (GLUCOPHAGE) 1,000 mg tablet TAKE 1 TABLET BY MOUTH TWICE DAILY WITH MEALS 180 Tab 5   ??? lisinopril (PRINIVIL, ZESTRIL) 40 mg tablet TAKE 1 TABLET BY MOUTH ONCE DAILY 90 Tab 5   ??? isosorbide dinitrate (ISORDIL) 30 mg tablet TAKE 1 TABLET BY MOUTH THREE TIMES DAILY 270 Tab 5   ??? glimepiride (AMARYL) 4 mg tablet TAKE 1 TABLET BY MOUTH ONCE EVERY MORNING 90 Tab 5   ??? cloNIDine HCl (CATAPRES) 0.1 mg tablet TAKE 1 TABLET BY MOUTH ONCE DAILY 90 Tab 5   ??? furosemide (LASIX) 40 mg tablet Take 40 mg by mouth two (2) times a day. Take three tablets twice a day.     ??? pravastatin (PRAVACHOL) 20 mg tablet Take 1 Tab by mouth nightly. 30 Tab 5   ??? aspirin 81 mg chewable tablet Take 81 mg by mouth daily.     ??? calcium carbonate (OS-CAL) 500 mg calcium (1,250 mg) tablet Take  by mouth daily.     ??? Oxygen      ??? promethazine-codeine (PHENERGAN WITH CODEINE) 6.25-10 mg/5 mL syrup Take  5 mL by mouth four (4) times daily as needed for Cough. Max Daily Amount: 20 mL. Indications: COUGH 180 mL 0     No Known Allergies    Objective:  Visit Vitals   ??? BP 142/69 (BP 1 Location: Left arm, BP Patient Position: Sitting)   ??? Pulse 77   ??? Temp 95.6 ??F (35.3 ??C) (Oral)   ??? Resp 24   ??? Ht '5\' 6"'  (1.676 m)   ??? Wt 302 lb (137 kg)   ??? SpO2 95%  Comment: 4 liters   ??? BMI 48.74 kg/m2     Physical Exam:   General appearance - alert, well appearing, in no distress  Mental status - alert, oriented to person, place, and time  EYE-PERRL, EOMI  Neck - supple, no significant adenopathy   Chest - symmetric decreased air entry, w/o wheezes, rales or rhonchi, + sob    Heart - normal rate, regular rhythm, stable blood pressure  Abdomen - soft, nontender, nondistended, no organomegaly  Ext-peripheral pulses  normal, no pedal edema  Neuro -alert, oriented, normal speech, no focal findings    Results for orders placed or performed in visit on 16/10/96   METABOLIC PANEL, COMPREHENSIVE   Result Value Ref Range    Glucose 94 65 - 99 mg/dL    BUN 24 8 - 27 mg/dL    Creatinine 0.87 0.57 - 1.00 mg/dL    GFR est non-AA 70 >59 mL/min/1.73    GFR est AA 80 >59 mL/min/1.73    BUN/Creatinine ratio 28 (H) 11 - 26    Sodium 142 134 - 144 mmol/L    Potassium 4.3 3.5 - 5.2 mmol/L    Chloride 96 (L) 97 - 108 mmol/L    CO2 31 (H) 18 - 29 mmol/L    Calcium 9.3 8.7 - 10.3 mg/dL    Protein, total 7.4 6.0 - 8.5 g/dL    Albumin 4.0 3.6 - 4.8 g/dL    GLOBULIN, TOTAL 3.4 1.5 - 4.5 g/dL    A-G Ratio 1.2 1.1 - 2.5    Bilirubin, total 0.8 0.0 - 1.2 mg/dL    Alk. phosphatase 73 39 - 117 IU/L    AST 17 0 - 40 IU/L    ALT 9 0 - 32 IU/L   CBC W/O DIFF   Result Value Ref Range    WBC 8.7 3.4 - 10.8 x10E3/uL    RBC 3.83 3.77 - 5.28 x10E6/uL    HGB 11.7 11.1 - 15.9 g/dL    HCT 34.9 34.0 - 46.6 %    MCV 91 79 - 97 fL    MCH 30.5 26.6 - 33.0 pg    MCHC 33.5 31.5 - 35.7 g/dL    RDW 15.2 12.3 - 15.4 %    PLATELET 252 150 - 379 x10E3/uL   VITAMIN D, 25 HYDROXY    Result Value Ref Range    VITAMIN D, 25-HYDROXY 31.1 30.0 - 100.0 ng/mL   LIPID PANEL   Result Value Ref Range    Cholesterol, total 191 100 - 199 mg/dL    Triglyceride 107 0 - 149 mg/dL    HDL Cholesterol 44 >39 mg/dL    VLDL, calculated 21 5 - 40 mg/dL    LDL, calculated 126 (H) 0 - 99 mg/dL   TSH 3RD GENERATION   Result Value Ref Range    TSH 3.310 0.450 - 4.500 uIU/mL   UA/M W/RFLX CULTURE, ROUTINE   Result Value Ref Range    Specific Gravity CANCELED     pH (UA) CANCELED     Protein CANCELED     Glucose CANCELED     Ketone CANCELED      Assessment/Plan:    ICD-10-CM ICD-9-CM    1. Hypertension goal BP (blood pressure) < 140/80 I10 401.9    2. Well controlled type 2 diabetes mellitus ([place]) E11.9 250.00    3. Comprehensive diabetic foot examination, type 2 DM, encounter for ([place]) E11.9 250.00 REFERRAL TO PODIATRY     Patient Instructions   Note blood pressure medications:  ?? 1. Lisinopril 40 mg: 1 tab daily  ?? 2. Metoprolol ER 200 mg: 1 tab daily  ?? 3. Furosemide 40 mg: 1 tabs 2  times a day  ?? 4. Isosorbide DN 30 mg: 1tab 3 time a day  ?? 5. Amlodipine 10 mg: take 1 tab a day  ?? 6. Clonidine 0.73m daily??    Diabetes medications:  Metformin 1000 mg: take 1 tab every 12 hours  Amaryl 4 mg:  take 1 tab in morning    Follow-up Disposition:  Return in about 3 months (around 06/15/2015), or if symptoms worsen or fail to improve, for routine follow up.

## 2015-03-15 NOTE — Patient Instructions (Addendum)
Note blood pressure medications:  ?? 1. Lisinopril 40 mg: 1 tab daily  ?? 2. Metoprolol ER 200 mg: 1 tab daily  ?? 3. Furosemide 40 mg: 1 tabs 2  times a day  ?? 4. Isosorbide DN 30 mg: 1tab 3 time a day  ?? 5. Amlodipine 10 mg: take 1 tab a day  ?? 6. Clonidine 0.1mg  daily??    Diabetes medications:  Metformin 1000 mg: take 1 tab every 12 hours  Amaryl 4 mg: take 1 tab in morning

## 2015-03-15 NOTE — Progress Notes (Signed)
1. Have you been to the ER, urgent care clinic since your last visit?  Hospitalized since your last visit?No    2. Have you seen or consulted any other health care providers outside of the [facility] since your last visit?  Include any pap smears or colon screening. No     Pt states she is here for follow up on htn

## 2015-03-27 ENCOUNTER — Ambulatory Visit: Payer: MEDICARE | Primary: Internal Medicine

## 2015-03-27 ENCOUNTER — Inpatient Hospital Stay: Payer: MEDICARE | Attending: Internal Medicine | Primary: Internal Medicine

## 2015-03-27 ENCOUNTER — Inpatient Hospital Stay: Admit: 2015-03-27 | Payer: MEDICARE | Attending: Internal Medicine | Primary: Internal Medicine

## 2015-03-27 DIAGNOSIS — Z1231 Encounter for screening mammogram for malignant neoplasm of breast: Secondary | ICD-10-CM

## 2015-03-27 DIAGNOSIS — M810 Age-related osteoporosis without current pathological fracture: Secondary | ICD-10-CM

## 2015-04-19 NOTE — Telephone Encounter (Signed)
-----   Message from Marybelle KillingsMegan R Britton sent at 04/18/2015  6:08 PM EST -----  Regarding: Dr. Sherre Scarletomah/Telephone  Gabe with [facility] needs medical records for patient.  Information faxed over on March 09, 2015, and April 06, 2015.  Please contact Gabe @ [phone]. Fax number is [phone].

## 2015-05-03 IMAGING — MG MAMMOGRAPHY SCREENING BILATERAL 3D TOMOSYNTHESIS WITH CAD
12 of 16 series · 12 of 32 positions shown · non-contrast
Comparison: 10/28/2013 through 05/11/2003.

MAMMOGRAPHY SCREENING 3D TOMOSYNTHESIS WITH CAD, 05/03/2015 [DATE]:
CLINICAL INDICATION: Screening. History of bilateral breast cancer.
TECHNIQUE: Digital mammograms and 3-D Tomosynthesis were obtained. These were
interpreted both primarily and with the aid of computer-aided detection
system.

[R MLO synth-2D]
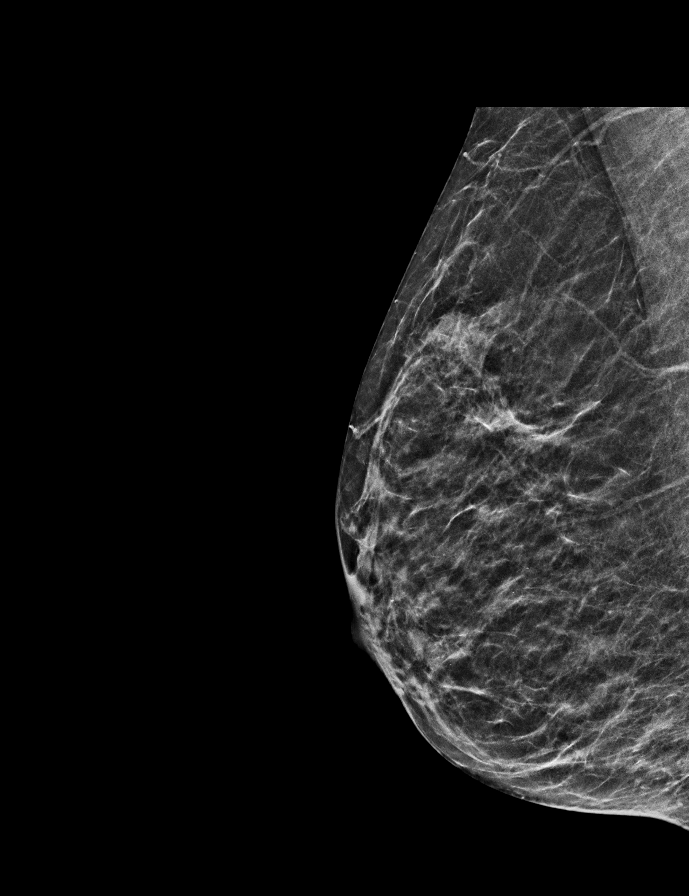

[R MLO]
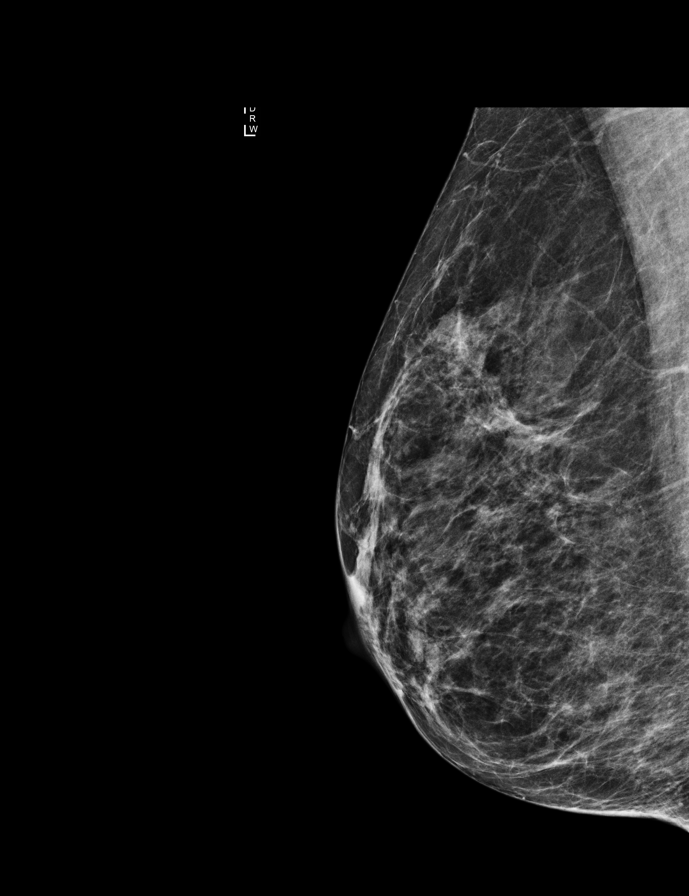

[L MLO synth-2D]
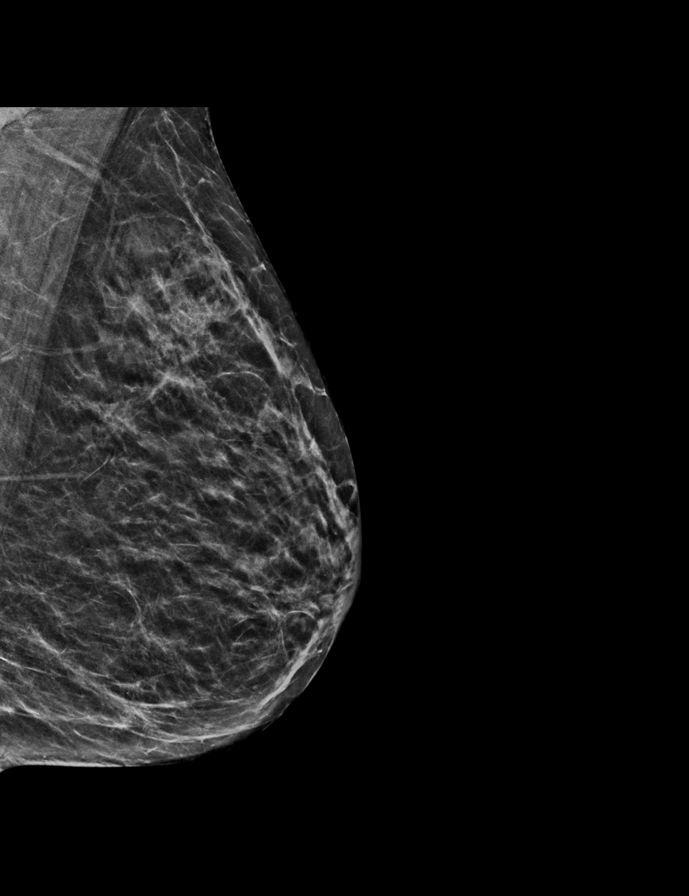

[L CC]
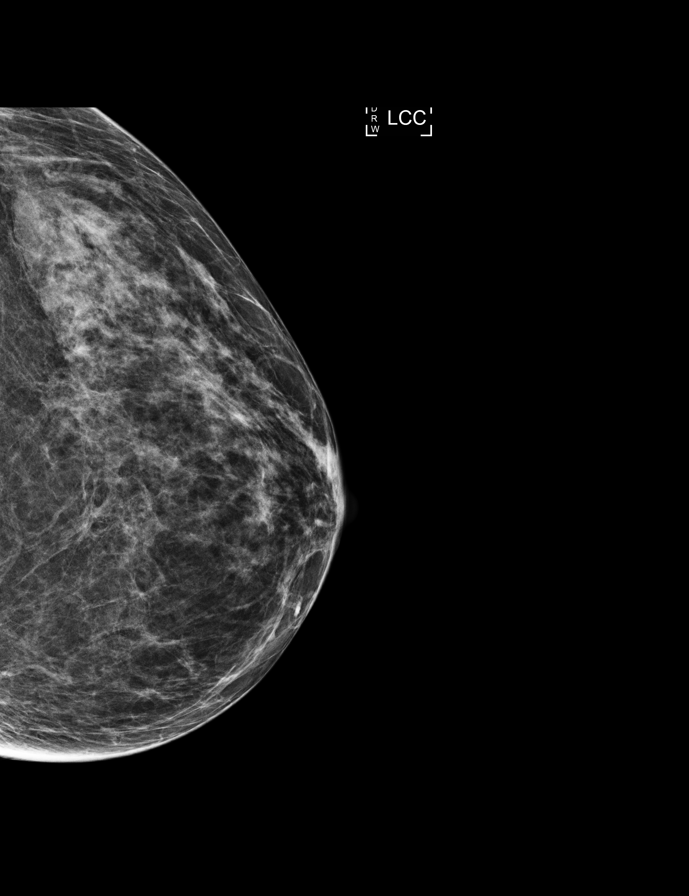

[L CC synth-2D]
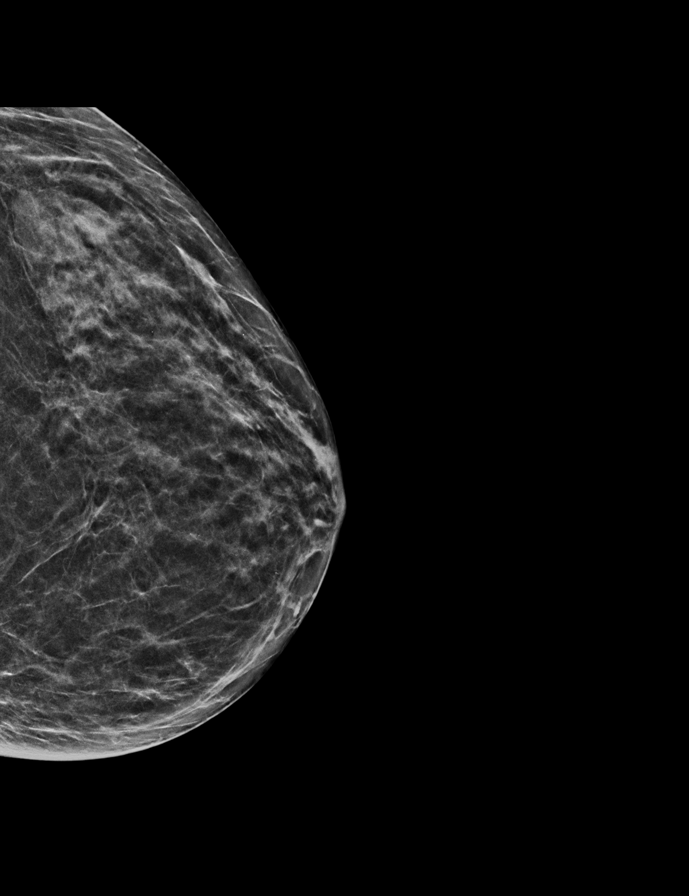

[L MLO]
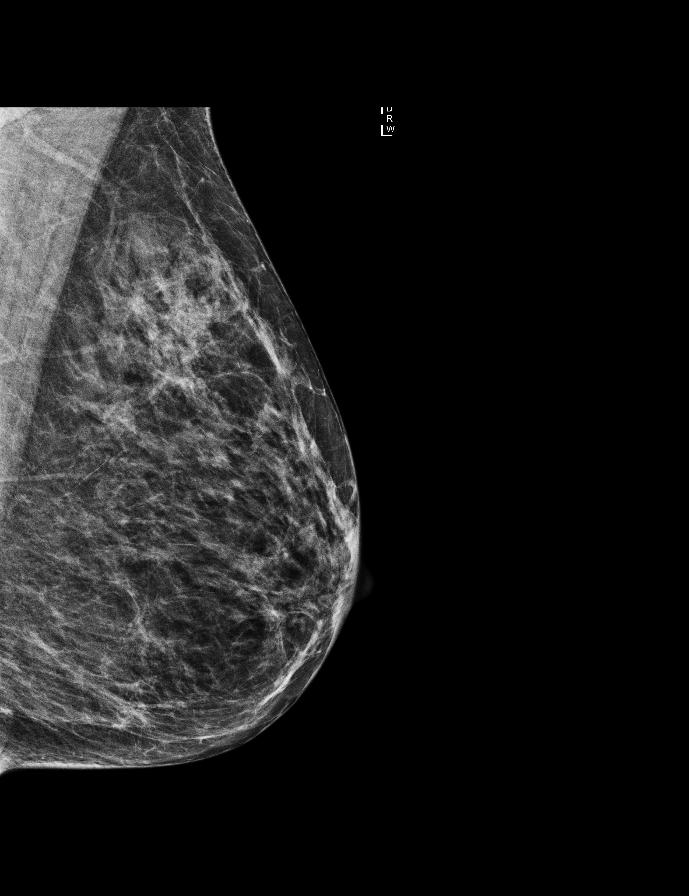

[R CC]
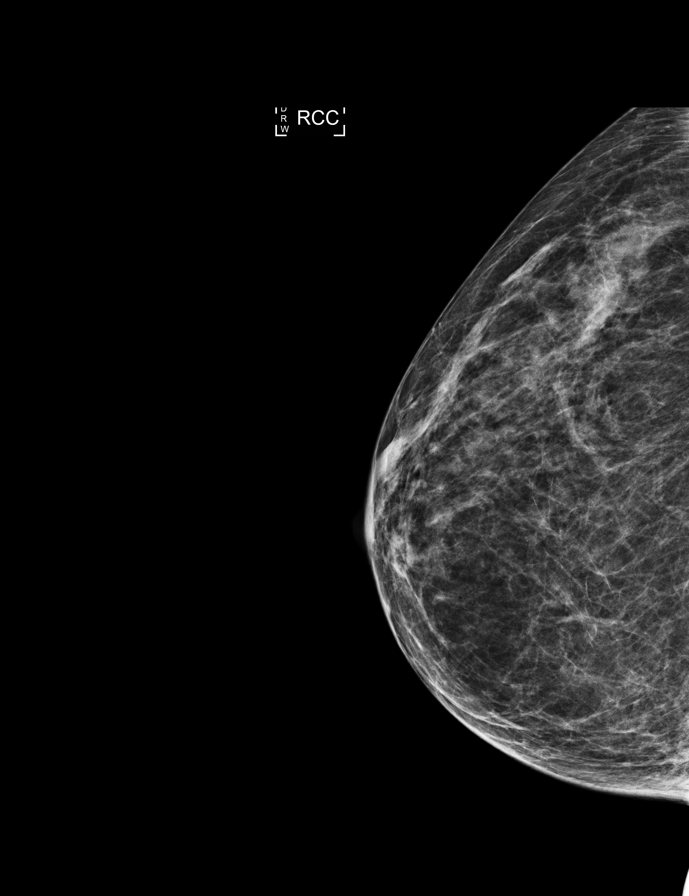

[R CC synth-2D]
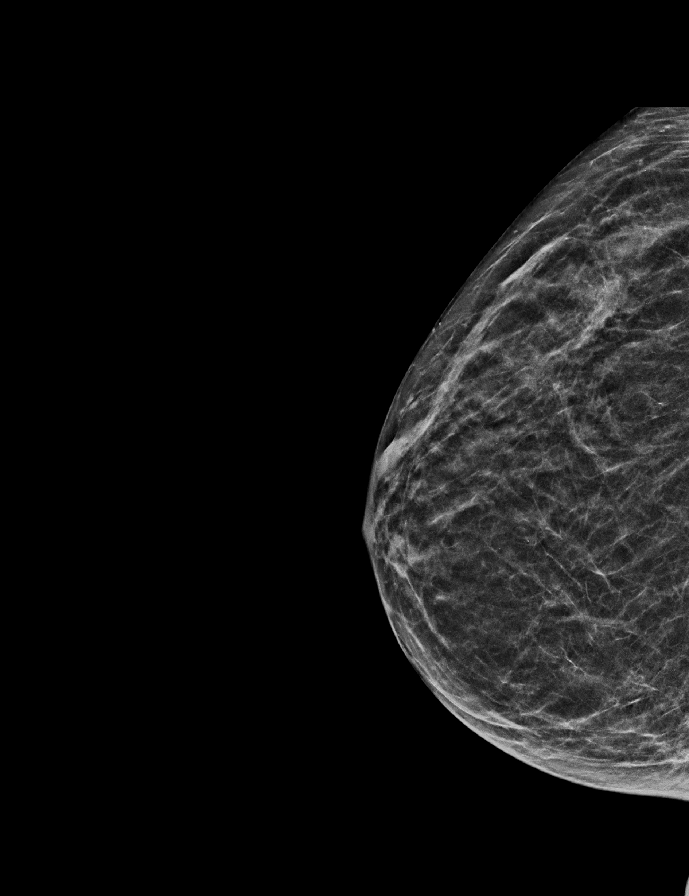

[R MLO tomo]
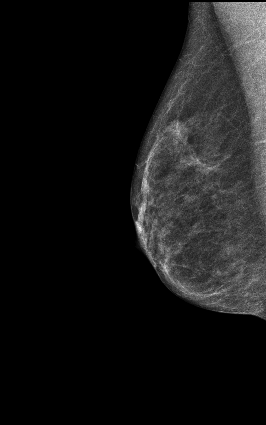

[L CC tomo]
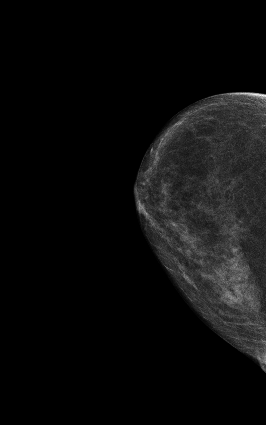

[L MLO tomo]
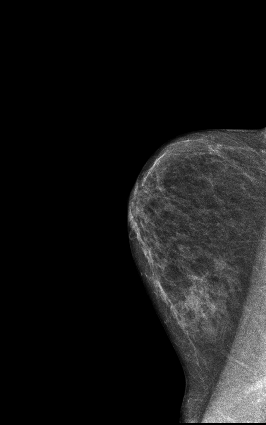

[R CC tomo]
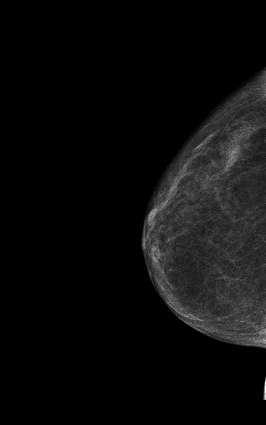

[12 of 32 positions shown; findings below may reference images not displayed]

BREAST DENSITY: ( Level C) The breasts are heterogeneously dense, which may
obscure small masses.
FINDINGS: No mammographically suspicious abnormality and no significant
change.
IMPRESSION: ( BI-RADS 2) Benign findings. Routine mammographic follow-up is recommended.

## 2015-05-20 MED ORDER — METFORMIN 1,000 MG TAB
1000 mg | ORAL_TABLET | ORAL | 11 refills | Status: DC
Start: 2015-05-20 — End: 2015-06-19

## 2015-05-25 ENCOUNTER — Encounter: Attending: Internal Medicine | Primary: Internal Medicine

## 2015-06-19 ENCOUNTER — Inpatient Hospital Stay: Admit: 2015-08-01 | Payer: MEDICARE | Primary: Internal Medicine

## 2015-06-19 ENCOUNTER — Ambulatory Visit: Admit: 2015-06-19 | Discharge: 2015-06-19 | Payer: MEDICARE | Attending: Internal Medicine | Primary: Internal Medicine

## 2015-06-19 DIAGNOSIS — E119 Type 2 diabetes mellitus without complications: Secondary | ICD-10-CM

## 2015-06-19 LAB — AMB POC HEMOGLOBIN A1C: Hemoglobin A1c (POC): 5.4 %

## 2015-06-19 LAB — AMB POC GLUCOSE BLOOD, BY GLUCOSE MONITORING DEVICE: Glucose POC: 86 mg/dL

## 2015-06-19 MED ORDER — FUROSEMIDE 40 MG TAB
40 mg | ORAL_TABLET | ORAL | 1 refills | Status: DC
Start: 2015-06-19 — End: 2015-12-17

## 2015-06-19 MED ORDER — FUROSEMIDE 40 MG TAB
40 mg | ORAL_TABLET | Freq: Two times a day (BID) | ORAL | 5 refills | Status: DC
Start: 2015-06-19 — End: 2015-06-19

## 2015-06-19 MED ORDER — CEPHALEXIN 500 MG CAP
500 mg | ORAL_CAPSULE | Freq: Two times a day (BID) | ORAL | 0 refills | Status: AC
Start: 2015-06-19 — End: 2015-06-24

## 2015-06-19 NOTE — Progress Notes (Signed)
1. Have you been to the ER, urgent care clinic since your last visit?  Hospitalized since your last visit?Yes Reason for visit: [facility] hospital for surgery on finger    2. Have you seen or consulted any other health care providers outside of the [facility] since your last visit?  Include any pap smears or colon screening. No     Pt is here for follow up on diabetes and htn. States she was admitted to [facility] for surg on finger. States O2 drop so they keep her overnight

## 2015-06-19 NOTE — Patient Instructions (Addendum)
DASH Diet: Care Instructions  Your Care Instructions  The DASH diet is an eating plan that can help lower your blood pressure. DASH stands for Dietary Approaches to Stop Hypertension. Hypertension is high blood pressure.  The DASH diet focuses on eating foods that are high in calcium, potassium, and magnesium. These nutrients can lower blood pressure. The foods that are highest in these nutrients are fruits, vegetables, low-fat dairy products, nuts, seeds, and legumes. But taking calcium, potassium, and magnesium supplements instead of eating foods that are high in those nutrients does not have the same effect. The DASH diet also includes whole grains, fish, and poultry.  The DASH diet is one of several lifestyle changes your doctor may recommend to lower your high blood pressure. Your doctor may also want you to decrease the amount of sodium in your diet. Lowering sodium while following the DASH diet can lower blood pressure even further than just the DASH diet alone.  Follow-up care is a key part of your treatment and safety. Be sure to make and go to all appointments, and call your doctor if you are having problems. It's also a good idea to know your test results and keep a list of the medicines you take.  How can you care for yourself at home?  Following the DASH diet  ?? Eat 4 to 5 servings of fruit each day. A serving is 1 medium-sized piece of fruit, ?? cup chopped or canned fruit, 1/4 cup dried fruit, or 4 ounces (?? cup) of fruit juice. Choose fruit more often than fruit juice.  ?? Eat 4 to 5 servings of vegetables each day. A serving is 1 cup of lettuce or raw leafy vegetables, ?? cup of chopped or cooked vegetables, or 4 ounces (?? cup) of vegetable juice. Choose vegetables more often than vegetable juice.  ?? Get 2 to 3 servings of low-fat and fat-free dairy each day. A serving is 8 ounces of milk, 1 cup of yogurt, or 1 ?? ounces of cheese.   ?? Eat 6 to 8 servings of grains each day. A serving is 1 slice of bread, 1 ounce of dry cereal, or ?? cup of cooked rice, pasta, or cooked cereal. Try to choose whole-grain products as much as possible.  ?? Limit lean meat, poultry, and fish to 2 servings each day. A serving is 3 ounces, about the size of a deck of cards.  ?? Eat 4 to 5 servings of nuts, seeds, and legumes (cooked dried beans, lentils, and split peas) each week. A serving is 1/3 cup of nuts, 2 tablespoons of seeds, or ?? cup of cooked beans or peas.  ?? Limit fats and oils to 2 to 3 servings each day. A serving is 1 teaspoon of vegetable oil or 2 tablespoons of salad dressing.  ?? Limit sweets and added sugars to 5 servings or less a week. A serving is 1 tablespoon jelly or jam, ?? cup sorbet, or 1 cup of lemonade.  ?? Eat less than 2,300 milligrams (mg) of sodium a day. If you limit your sodium to 1,500 mg a day, you can lower your blood pressure even more.  Tips for success  ?? Start small. Do not try to make dramatic changes to your diet all at once. You might feel that you are missing out on your favorite foods and then be more likely to not follow the plan. Make small changes, and stick with them. Once those changes become habit, add a few more   changes.  ?? Try some of the following:  ?? Make it a goal to eat a fruit or vegetable at every meal and at snacks. This will make it easy to get the recommended amount of fruits and vegetables each day.  ?? Try yogurt topped with fruit and nuts for a snack or healthy dessert.  ?? Add lettuce, tomato, cucumber, and onion to sandwiches.  ?? Combine a ready-made pizza crust with low-fat mozzarella cheese and lots of vegetable toppings. Try using tomatoes, squash, spinach, broccoli, carrots, cauliflower, and onions.  ?? Have a variety of cut-up vegetables with a low-fat dip as an appetizer instead of chips and dip.  ?? Sprinkle sunflower seeds or chopped almonds over salads. Or try adding  chopped walnuts or almonds to cooked vegetables.  ?? Try some vegetarian meals using beans and peas. Add garbanzo or kidney beans to salads. Make burritos and tacos with mashed pinto beans or black beans.  Where can you learn more?  Go to http://www.healthwise.net/GoodHelpConnections.  Enter H967 in the search box to learn more about "DASH Diet: Care Instructions."  Current as of: August 23, 2014  Content Version: 11.1  ?? 2006-2016 Healthwise, Incorporated. Care instructions adapted under license by [employer] (which disclaims liability or warranty for this information). If you have questions about a medical condition or this instruction, always ask your healthcare professional. Healthwise, Incorporated disclaims any warranty or liability for your use of this information.

## 2015-06-19 NOTE — Progress Notes (Signed)
Chief Complaint   Patient presents with   ??? Diabetes   ??? Hypertension     HPI:  Malay Fantroy is a 65 y.o. AA female presenting for 3 month on diabetes, hypertension, low vit d level, hypercholesterolemia. She has appointment with her pulmonologist in 6 month for COPD, she is chronically hypoxic on home oxygen 4L. Also, she has additional complaints of right hand swelling, no fever/chills, no injury.   She was seen in [facility] in 05/2015 for similar swelling and treated with antibiotics.     Review of Systems  As per hpi    Past Medical History   Diagnosis Date   ??? Chronic obstructive pulmonary disease ([place])    ??? Congestive heart failure ([place])    ??? Diabetes ([place])    ??? Hypertension      History reviewed. No pertinent past surgical history.     Social History   ??? Marital status: SINGLE     Spouse name: N/A   ??? Number of children: N/A   ??? Years of education: N/A     Social History Main Topics   ??? Smoking status: Never Smoker   ??? Smokeless tobacco: Never Used   ??? Alcohol use No   ??? Drug use: No   ??? Sexual activity: No     Current Outpatient Prescriptions   Medication Sig Dispense Refill   ??? amLODIPine (NORVASC) 10 mg tablet Take 1 Tab by mouth daily. 90 Tab 1   ??? metoprolol succinate (TOPROL-XL) 200 mg XL tablet TAKE 1 TABLET BY MOUTH DAILY FOR HIGH BLOOD PRESSURE 90 Tab 5   ??? metFORMIN (GLUCOPHAGE) 1,000 mg tablet TAKE 1 TABLET BY MOUTH TWICE DAILY WITH MEALS 180 Tab 5   ??? lisinopril (PRINIVIL, ZESTRIL) 40 mg tablet TAKE 1 TABLET BY MOUTH ONCE DAILY 90 Tab 5   ??? isosorbide dinitrate (ISORDIL) 30 mg tablet TAKE 1 TABLET BY MOUTH THREE TIMES DAILY 270 Tab 5   ??? glimepiride (AMARYL) 4 mg tablet TAKE 1 TABLET BY MOUTH ONCE EVERY MORNING 90 Tab 5   ??? cloNIDine HCl (CATAPRES) 0.1 mg tablet TAKE 1 TABLET BY MOUTH ONCE DAILY 90 Tab 5   ??? furosemide (LASIX) 40 mg tablet Take 40 mg by mouth two (2) times a day. Take three tablets twice a day.     ??? pravastatin (PRAVACHOL) 20 mg tablet Take 1 Tab by mouth nightly. 30 Tab 5    ??? aspirin 81 mg chewable tablet Take 81 mg by mouth daily.     ??? calcium carbonate (OS-CAL) 500 mg calcium (1,250 mg) tablet Take  by mouth daily.     ??? Oxygen        No Known Allergies    Objective:  Visit Vitals   ??? BP 139/79 (BP 1 Location: Left arm, BP Patient Position: Sitting)   ??? Pulse 64   ??? Temp 96.4 ??F (35.8 ??C) (Oral)   ??? Resp 22   ??? Ht '5\' 6"'$  (1.676 m)   ??? Wt 307 lb 12.8 oz (139.6 kg)   ??? SpO2 (!) 84%  Comment: 4 liters nasal cannula   ??? BMI 49.68 kg/m2     Physical Exam:   General appearance - alert, well appearing, in no distress  Mental status - alert, oriented to person, place, and time  EYE-PERRL, EOMI  Neck - supple, no significant adenopathy   Chest - clear to auscultation, no wheezes, rales or rhonchi  Heart - normal rate, regular rhythm, normal blood  pressure  Abdomen - soft, nontender, nondistended, no organomegaly  Ext-peripheral pulses normal, no pedal edema  Neuro -alert, oriented, normal speech, no focal findings  Back-no antalgic gait, no limited range of motion, no pain with motion noted during exam     Recent Results (from the past 12 hour(s))   AMB POC GLUCOSE BLOOD, BY GLUCOSE MONITORING DEVICE    Collection Time: 06/19/15  9:15 AM   Result Value Ref Range    Glucose POC 86 mg/dL   AMB POC HEMOGLOBIN A1C    Collection Time: 06/19/15  9:28 AM   Result Value Ref Range    Hemoglobin A1c (POC) 5.4 %     Assessment/Plan:    ICD-10-CM ICD-9-CM    1. Well controlled type 2 diabetes mellitus (HCC) E11.9 250.00 AMB POC GLUCOSE BLOOD, BY GLUCOSE MONITORING DEVICE      AMB POC HEMOGLOBIN G6Y      METABOLIC PANEL, COMPREHENSIVE      UA/M W/RFLX CULTURE, ROUTINE   2. 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 Hypertension, uncontrolled I94 854.6 METABOLIC PANEL, COMPREHENSIVE      UA/M W/RFLX CULTURE, ROUTINE   4. Hypercholesterolemia E78.00 272.0 LIPID PANEL   5. Hypovitaminosis D E55.9 268.9 VITAMIN D, 25 HYDROXY    6. Cellulitis of finger of right hand L03.011 681.00 SED RATE (ESR)      cephALEXin (KEFLEX) 500 mg capsule   7. Chronic acquired lymphedema I89.0 457.1 furosemide (LASIX) 40 mg tablet     Patient Instructions        DASH Diet: Care Instructions  Your Care Instructions  The DASH diet is an eating plan that can help lower your blood pressure. DASH stands for Dietary Approaches to Stop Hypertension. Hypertension is high blood pressure.  The DASH diet focuses on eating foods that are high in calcium, potassium, and magnesium. These nutrients can lower blood pressure. The foods that are highest in these nutrients are fruits, vegetables, low-fat dairy products, nuts, seeds, and legumes. But taking calcium, potassium, and magnesium supplements instead of eating foods that are high in those nutrients does not have the same effect. The DASH diet also includes whole grains, fish, and poultry.  The DASH diet is one of several lifestyle changes your doctor may recommend to lower your high blood pressure. Your doctor may also want you to decrease the amount of sodium in your diet. Lowering sodium while following the DASH diet can lower blood pressure even further than just the DASH diet alone.  Follow-up care is a key part of your treatment and safety. Be sure to make and go to all appointments, and call your doctor if you are having problems. It's also a good idea to know your test results and keep a list of the medicines you take.  How can you care for yourself at home?  Following the DASH diet  ?? Eat 4 to 5 servings of fruit each day. A serving is 1 medium-sized piece of fruit, ?? cup chopped or canned fruit, 1/4 cup dried fruit, or 4 ounces (?? cup) of fruit juice. Choose fruit more often than fruit juice.  ?? Eat 4 to 5 servings of vegetables each day. A serving is 1 cup of lettuce or raw leafy vegetables, ?? cup of chopped or cooked vegetables, or 4 ounces (?? cup) of vegetable juice. Choose vegetables more often than  vegetable juice.  ?? Get 2 to 3 servings of low-fat and fat-free dairy each  day. A serving is 8 ounces of milk, 1 cup of yogurt, or 1 ?? ounces of cheese.  ?? Eat 6 to 8 servings of grains each day. A serving is 1 slice of bread, 1 ounce of dry cereal, or ?? cup of cooked rice, pasta, or cooked cereal. Try to choose whole-grain products as much as possible.  ?? Limit lean meat, poultry, and fish to 2 servings each day. A serving is 3 ounces, about the size of a deck of cards.  ?? Eat 4 to 5 servings of nuts, seeds, and legumes (cooked dried beans, lentils, and split peas) each week. A serving is 1/3 cup of nuts, 2 tablespoons of seeds, or ?? cup of cooked beans or peas.  ?? Limit fats and oils to 2 to 3 servings each day. A serving is 1 teaspoon of vegetable oil or 2 tablespoons of salad dressing.  ?? Limit sweets and added sugars to 5 servings or less a week. A serving is 1 tablespoon jelly or jam, ?? cup sorbet, or 1 cup of lemonade.  ?? Eat less than 2,300 milligrams (mg) of sodium a day. If you limit your sodium to 1,500 mg a day, you can lower your blood pressure even more.  Tips for success  ?? Start small. Do not try to make dramatic changes to your diet all at once. You might feel that you are missing out on your favorite foods and then be more likely to not follow the plan. Make small changes, and stick with them. Once those changes become habit, add a few more changes.  ?? Try some of the following:  ?? Make it a goal to eat a fruit or vegetable at every meal and at snacks. This will make it easy to get the recommended amount of fruits and vegetables each day.  ?? Try yogurt topped with fruit and nuts for a snack or healthy dessert.  ?? Add lettuce, tomato, cucumber, and onion to sandwiches.  ?? Combine a ready-made pizza crust with low-fat mozzarella cheese and lots of vegetable toppings. Try using tomatoes, squash, spinach, broccoli, carrots, cauliflower, and onions.   ?? Have a variety of cut-up vegetables with a low-fat dip as an appetizer instead of chips and dip.  ?? Sprinkle sunflower seeds or chopped almonds over salads. Or try adding chopped walnuts or almonds to cooked vegetables.  ?? Try some vegetarian meals using beans and peas. Add garbanzo or kidney beans to salads. Make burritos and tacos with mashed pinto beans or black beans.  Where can you learn more?  Go to [employer].  Enter [phone] in the search box to learn more about "DASH Diet: Care Instructions."  Current as of: August 23, 2014  Content Version: 11.1  ?? 2006-2016 Healthwise, Incorporated. Care instructions adapted under license by [employer] (which disclaims liability or warranty for this information). If you have questions about a medical condition or this instruction, always ask your healthcare professional. [place] any warranty or liability for your use of this information.      Follow-up Disposition:  Return in about 3 months (around 09/17/2015), or if symptoms worsen or fail to improve, for routine follow up.

## 2015-06-20 LAB — LIPID PANEL
Cholesterol, total: 179 mg/dL (ref 100–199)
HDL Cholesterol: 46 mg/dL (ref >39)
LDL, calculated: 114 mg/dL — ABNORMAL HIGH (ref 0–99)
Triglyceride: 96 mg/dL (ref 0–149)
VLDL, calculated: 19 mg/dL (ref 5–40)

## 2015-06-20 LAB — UA/M W/RFLX CULTURE, ROUTINE
Bilirubin: NEGATIVE
Blood: NEGATIVE
Glucose: NEGATIVE
Ketone: NEGATIVE
Leukocyte Esterase: NEGATIVE
Nitrites: NEGATIVE
Specific Gravity: 1.018 (ref 1.005–1.030)
Urobilinogen: 0.2 mg/dL (ref 0.2–1.0)
pH (UA): 8 — ABNORMAL HIGH (ref 5.0–7.5)

## 2015-06-20 LAB — METABOLIC PANEL, COMPREHENSIVE
A-G Ratio: 1.1 (ref 1.1–2.5)
ALT (SGPT): 6 IU/L (ref 0–32)
AST (SGOT): 13 IU/L (ref 0–40)
Albumin: 4.1 g/dL (ref 3.6–4.8)
Alk. phosphatase: 68 IU/L (ref 39–117)
BUN/Creatinine ratio: 20 (ref 11–26)
BUN: 18 mg/dL (ref 8–27)
Bilirubin, total: 0.6 mg/dL (ref 0.0–1.2)
CO2: 28 mmol/L (ref 18–29)
Calcium: 9.4 mg/dL (ref 8.7–10.3)
Chloride: 97 mmol/L (ref 96–106)
Creatinine: 0.88 mg/dL (ref 0.57–1.00)
GFR est AA: 79 mL/min/{1.73_m2} (ref >59)
GFR est non-AA: 69 mL/min/{1.73_m2} (ref >59)
GLOBULIN, TOTAL: 3.6 g/dL (ref 1.5–4.5)
Glucose: 72 mg/dL (ref 65–99)
Potassium: 4.2 mmol/L (ref 3.5–5.2)
Protein, total: 7.7 g/dL (ref 6.0–8.5)
Sodium: 142 mmol/L (ref 134–144)

## 2015-06-20 LAB — MICROSCOPIC EXAMINATION: Casts: NONE SEEN /lpf

## 2015-06-20 LAB — SED RATE (ESR): Sed rate (ESR): 12 mm/hr (ref 0–40)

## 2015-06-20 LAB — VITAMIN D, 25 HYDROXY: VITAMIN D, 25-HYDROXY: 21.9 ng/mL — ABNORMAL LOW (ref 30.0–100.0)

## 2015-07-26 ENCOUNTER — Emergency Department: Admit: 2015-07-26 | Payer: MEDICARE | Primary: Internal Medicine

## 2015-07-26 ENCOUNTER — Inpatient Hospital Stay
Admit: 2015-07-26 | Discharge: 2015-07-29 | Disposition: A | Discharge status: Home Health | Payer: MEDICARE | Attending: Internal Medicine | Admitting: Internal Medicine

## 2015-07-26 DIAGNOSIS — I110 Hypertensive heart disease with heart failure: Secondary | ICD-10-CM

## 2015-07-26 LAB — METABOLIC PANEL, COMPREHENSIVE
A-G Ratio: 0.7 — ABNORMAL LOW (ref 1.1–2.2)
ALT (SGPT): 11 U/L — ABNORMAL LOW (ref 12–78)
AST (SGOT): 14 U/L — ABNORMAL LOW (ref 15–37)
Albumin: 3.4 g/dL — ABNORMAL LOW (ref 3.5–5.0)
Alk. phosphatase: 70 U/L (ref 45–117)
Anion gap: 6 mmol/L (ref 5–15)
BUN/Creatinine ratio: 22 — ABNORMAL HIGH (ref 12–20)
BUN: 22 MG/DL — ABNORMAL HIGH (ref 6–20)
Bilirubin, total: 0.5 MG/DL (ref 0.2–1.0)
CO2: 34 mmol/L — ABNORMAL HIGH (ref 21–32)
Calcium: 8 MG/DL — ABNORMAL LOW (ref 8.5–10.1)
Chloride: 101 mmol/L (ref 97–108)
Creatinine: 1 MG/DL (ref 0.55–1.02)
GFR est AA: >60 mL/min/{1.73_m2} (ref >60)
GFR est non-AA: 55 mL/min/{1.73_m2} — ABNORMAL LOW (ref >60)
Globulin: 4.6 g/dL — ABNORMAL HIGH (ref 2.0–4.0)
Glucose: 179 mg/dL — ABNORMAL HIGH (ref 65–100)
Potassium: 3.5 mmol/L (ref 3.5–5.1)
Protein, total: 8 g/dL (ref 6.4–8.2)
Sodium: 141 mmol/L (ref 136–145)

## 2015-07-26 LAB — CBC WITH AUTOMATED DIFF
ABS. BASOPHILS: 0 10*3/uL (ref 0.0–0.1)
ABS. EOSINOPHILS: 0.2 10*3/uL (ref 0.0–0.4)
ABS. LYMPHOCYTES: 1 10*3/uL (ref 0.8–3.5)
ABS. MONOCYTES: 0.6 10*3/uL (ref 0.0–1.0)
ABS. NEUTROPHILS: 8.8 10*3/uL — ABNORMAL HIGH (ref 1.8–8.0)
BASOPHILS: 0 % (ref 0–1)
EOSINOPHILS: 2 % (ref 0–7)
HCT: 39.4 % (ref 35.0–47.0)
HGB: 12.2 g/dL (ref 11.5–16.0)
LYMPHOCYTES: 9 % — ABNORMAL LOW (ref 12–49)
MCH: 28.2 PG (ref 26.0–34.0)
MCHC: 31 g/dL (ref 30.0–36.5)
MCV: 91.2 FL (ref 80.0–99.0)
MONOCYTES: 5 % (ref 5–13)
NEUTROPHILS: 84 % — ABNORMAL HIGH (ref 32–75)
PLATELET: 225 10*3/uL (ref 150–400)
RBC: 4.32 M/uL (ref 3.80–5.20)
RDW: 15.5 % — ABNORMAL HIGH (ref 11.5–14.5)
WBC: 10.6 10*3/uL (ref 3.6–11.0)

## 2015-07-26 LAB — NT-PRO BNP: NT pro-BNP: 597 PG/ML — ABNORMAL HIGH (ref 0–125)

## 2015-07-26 LAB — TROPONIN I: Troponin-I, Qt.: <0.04 ng/mL (ref <0.05)

## 2015-07-26 LAB — GLUCOSE, POC
Glucose (POC): 133 mg/dL — ABNORMAL HIGH (ref 65–100)
Glucose (POC): 362 mg/dL — ABNORMAL HIGH (ref 65–100)
Glucose (POC): 446 mg/dL — ABNORMAL HIGH (ref 65–100)
Glucose (POC): 414 mg/dL — ABNORMAL HIGH (ref 65–100)

## 2015-07-26 LAB — EKG, 12 LEAD, INITIAL
Atrial Rate: 63 {beats}/min
Calculated P Axis: 56 degrees
Calculated R Axis: 4 degrees
Calculated T Axis: 22 degrees
P-R Interval: 200 ms
Q-T Interval: 462 ms
QRS Duration: 96 ms
QTC Calculation (Bezet): 472 ms
Ventricular Rate: 63 {beats}/min

## 2015-07-26 LAB — BLOOD GAS, ARTERIAL
BASE EXCESS: 2.6 mmol/L
BICARBONATE: 30 mmol/L — ABNORMAL HIGH (ref 22–26)
O2 FLOW RATE: 15 L/min
O2 SAT: 95 % (ref 92–97)
PCO2: 57 mmHg — ABNORMAL HIGH (ref 35.0–45.0)
PO2: 83 mmHg (ref 80–100)
SPONTANEOUS RATE: 16
pH: 7.34 — ABNORMAL LOW (ref 7.35–7.45)

## 2015-07-26 LAB — INFLUENZA A & B AG (RAPID TEST)
Influenza A Antigen: NEGATIVE
Influenza B Antigen: NEGATIVE

## 2015-07-26 LAB — URINALYSIS W/ REFLEX CULTURE
Bacteria: NEGATIVE /hpf
Bilirubin: NEGATIVE
Blood: NEGATIVE
Glucose: NEGATIVE mg/dL
Ketone: NEGATIVE mg/dL
Leukocyte Esterase: NEGATIVE
Nitrites: NEGATIVE
Protein: NEGATIVE mg/dL
Specific gravity: 1.015 (ref 1.003–1.030)
Urobilinogen: 0.2 EU/dL (ref 0.2–1.0)
pH (UA): 6 (ref 5.0–8.0)

## 2015-07-26 LAB — LACTIC ACID
Lactic acid: 2.4 MMOL/L — CR (ref 0.4–2.0)
Lactic acid: 1.9 MMOL/L (ref 0.4–2.0)

## 2015-07-26 LAB — BNP: BNP: 349 pg/mL — ABNORMAL HIGH (ref 0–100)

## 2015-07-26 LAB — PTT: aPTT: 34.7 s — ABNORMAL HIGH (ref 22.1–32.5)

## 2015-07-26 LAB — PROTHROMBIN TIME + INR
INR: 1 (ref 0.9–1.1)
Prothrombin time: 10.2 s (ref 9.0–11.1)

## 2015-07-26 LAB — CK W/ CKMB & INDEX
CK - MB: <1 NG/ML (ref <3.6)
CK: 75 U/L (ref 26–192)

## 2015-07-26 MED ORDER — ISOSORBIDE DINITRATE 20 MG TAB
20 mg | Freq: Three times a day (TID) | ORAL | Status: DC
Start: 2015-07-26 — End: 2015-07-29
  Administered 2015-07-26 – 2015-07-29 (×11): via ORAL

## 2015-07-26 MED ORDER — SODIUM CHLORIDE 0.9 % IJ SYRG
INTRAMUSCULAR | Status: DC | PRN
Start: 2015-07-26 — End: 2015-07-29

## 2015-07-26 MED ORDER — LISINOPRIL 20 MG TAB
20 mg | Freq: Every day | ORAL | Status: DC
Start: 2015-07-26 — End: 2015-07-27
  Administered 2015-07-27: 14:00:00 via ORAL

## 2015-07-26 MED ORDER — GLUCAGON 1 MG INJECTION
1 mg | INTRAMUSCULAR | Status: DC | PRN
Start: 2015-07-26 — End: 2015-07-29

## 2015-07-26 MED ORDER — FUROSEMIDE 10 MG/ML IJ SOLN
10 mg/mL | INTRAMUSCULAR | Status: AC
Start: 2015-07-26 — End: 2015-07-26
  Administered 2015-07-26: 10:00:00 via INTRAVENOUS

## 2015-07-26 MED ORDER — FUROSEMIDE 10 MG/ML IJ SOLN
10 mg/mL | Freq: Two times a day (BID) | INTRAMUSCULAR | Status: DC
Start: 2015-07-26 — End: 2015-07-28
  Administered 2015-07-26 – 2015-07-28 (×4): via INTRAVENOUS

## 2015-07-26 MED ORDER — SODIUM CHLORIDE 0.9 % IJ SYRG
Freq: Three times a day (TID) | INTRAMUSCULAR | Status: DC
Start: 2015-07-26 — End: 2015-07-29
  Administered 2015-07-26 – 2015-07-29 (×9): via INTRAVENOUS

## 2015-07-26 MED ORDER — INSULIN LISPRO 100 UNIT/ML INJECTION
100 unit/mL | Freq: Four times a day (QID) | SUBCUTANEOUS | Status: DC
Start: 2015-07-26 — End: 2015-07-29
  Administered 2015-07-27 – 2015-07-29 (×9): via SUBCUTANEOUS

## 2015-07-26 MED ORDER — INSULIN LISPRO 100 UNIT/ML INJECTION
100 unit/mL | Freq: Once | SUBCUTANEOUS | Status: AC
Start: 2015-07-26 — End: 2015-07-26
  Administered 2015-07-26: via SUBCUTANEOUS

## 2015-07-26 MED ORDER — GLUCOSE 4 GRAM CHEWABLE TAB
4 gram | ORAL | Status: DC | PRN
Start: 2015-07-26 — End: 2015-07-29

## 2015-07-26 MED ORDER — CLONIDINE 0.1 MG TAB
0.1 mg | Freq: Every day | ORAL | Status: DC
Start: 2015-07-26 — End: 2015-07-29
  Administered 2015-07-26 – 2015-07-29 (×4): via ORAL

## 2015-07-26 MED ORDER — IPRATROPIUM-ALBUTEROL 2.5 MG-0.5 MG/3 ML NEB SOLUTION
2.5 mg-0.5 mg/3 ml | RESPIRATORY_TRACT | Status: DC
Start: 2015-07-26 — End: 2015-07-26
  Administered 2015-07-26: 13:00:00 via RESPIRATORY_TRACT

## 2015-07-26 MED ORDER — METHYLPREDNISOLONE (PF) 125 MG/2 ML IJ SOLR
125 mg/2 mL | INTRAMUSCULAR | Status: AC
Start: 2015-07-26 — End: 2015-07-26
  Administered 2015-07-26: 11:00:00 via INTRAVENOUS

## 2015-07-26 MED ORDER — HYDRALAZINE 20 MG/ML IJ SOLN
20 mg/mL | Freq: Four times a day (QID) | INTRAMUSCULAR | Status: DC | PRN
Start: 2015-07-26 — End: 2015-07-29
  Administered 2015-07-27: 01:00:00 via INTRAVENOUS

## 2015-07-26 MED ORDER — METHYLPREDNISOLONE (PF) 40 MG/ML IJ SOLR
40 mg/mL | Freq: Two times a day (BID) | INTRAMUSCULAR | Status: DC
Start: 2015-07-26 — End: 2015-07-27
  Administered 2015-07-26 – 2015-07-27 (×2): via INTRAVENOUS

## 2015-07-26 MED ORDER — PERFLUTREN LIPID MICROSPHERES 1.1 MG/ML IV
1.1 mg/mL | INTRAVENOUS | Status: AC
Start: 2015-07-26 — End: 2015-07-26
  Administered 2015-07-26: 18:00:00

## 2015-07-26 MED ORDER — PRAVASTATIN 10 MG TAB
10 mg | Freq: Every evening | ORAL | Status: DC
Start: 2015-07-26 — End: 2015-07-29
  Administered 2015-07-27 – 2015-07-29 (×4): via ORAL

## 2015-07-26 MED ORDER — METHYLPREDNISOLONE (PF) 40 MG/ML IJ SOLR
40 mg/mL | Freq: Two times a day (BID) | INTRAMUSCULAR | Status: DC
Start: 2015-07-26 — End: 2015-07-26

## 2015-07-26 MED ORDER — FUROSEMIDE 10 MG/ML IJ SOLN
10 mg/mL | Freq: Two times a day (BID) | INTRAMUSCULAR | Status: DC
Start: 2015-07-26 — End: 2015-07-26

## 2015-07-26 MED ORDER — IPRATROPIUM-ALBUTEROL 2.5 MG-0.5 MG/3 ML NEB SOLUTION
2.5 mg-0.5 mg/3 ml | Freq: Four times a day (QID) | RESPIRATORY_TRACT | Status: DC
Start: 2015-07-26 — End: 2015-07-29
  Administered 2015-07-26 – 2015-07-29 (×11): via RESPIRATORY_TRACT

## 2015-07-26 MED ORDER — CALCIUM CARBONATE 500 MG (1250 MG) TAB
500 mg calcium (1,250 mg) | Freq: Every day | ORAL | Status: DC
Start: 2015-07-26 — End: 2015-07-29
  Administered 2015-07-26 – 2015-07-29 (×4): via ORAL

## 2015-07-26 MED ORDER — AMLODIPINE 5 MG TAB
5 mg | Freq: Every day | ORAL | Status: DC
Start: 2015-07-26 — End: 2015-07-29
  Administered 2015-07-26 – 2015-07-29 (×4): via ORAL

## 2015-07-26 MED ORDER — DEXTROSE 50% IN WATER (D50W) IV SYRG
INTRAVENOUS | Status: DC | PRN
Start: 2015-07-26 — End: 2015-07-29
  Administered 2015-07-29: 12:00:00 via INTRAVENOUS

## 2015-07-26 MED ORDER — ENOXAPARIN 40 MG/0.4 ML SUB-Q SYRINGE
40 mg/0.4 mL | Freq: Two times a day (BID) | SUBCUTANEOUS | Status: DC
Start: 2015-07-26 — End: 2015-07-29
  Administered 2015-07-26 – 2015-07-29 (×7): via SUBCUTANEOUS

## 2015-07-26 MED ORDER — IPRATROPIUM-ALBUTEROL 2.5 MG-0.5 MG/3 ML NEB SOLUTION
2.5 mg-0.5 mg/3 ml | RESPIRATORY_TRACT | Status: AC
Start: 2015-07-26 — End: 2015-07-26
  Administered 2015-07-26: 11:00:00 via RESPIRATORY_TRACT

## 2015-07-26 MED ORDER — METOPROLOL SUCCINATE SR 50 MG 24 HR TAB
50 mg | Freq: Every day | ORAL | Status: DC
Start: 2015-07-26 — End: 2015-07-29
  Administered 2015-07-27 – 2015-07-29 (×3): via ORAL

## 2015-07-26 MED ORDER — SODIUM CHLORIDE 0.9 % IJ SYRG
INTRAMUSCULAR | Status: AC
Start: 2015-07-26 — End: 2015-07-26
  Administered 2015-07-26: 18:00:00 via INTRAVENOUS

## 2015-07-26 MED ORDER — ACETAMINOPHEN 325 MG TABLET
325 mg | ORAL | Status: DC | PRN
Start: 2015-07-26 — End: 2015-07-29

## 2015-07-26 MED ORDER — ASPIRIN 81 MG CHEWABLE TAB
81 mg | Freq: Every day | ORAL | Status: DC
Start: 2015-07-26 — End: 2015-07-29
  Administered 2015-07-26 – 2015-07-29 (×4): via ORAL

## 2015-07-26 MED ORDER — ONDANSETRON (PF) 4 MG/2 ML INJECTION
4 mg/2 mL | INTRAMUSCULAR | Status: DC | PRN
Start: 2015-07-26 — End: 2015-07-29

## 2015-07-26 MED FILL — BD POSIFLUSH NORMAL SALINE 0.9 % INJECTION SYRINGE: INTRAMUSCULAR | Qty: 10

## 2015-07-26 MED FILL — ISOSORBIDE DINITRATE 10 MG TAB: 10 mg | ORAL | Qty: 1

## 2015-07-26 MED FILL — CHILDREN'S ASPIRIN 81 MG CHEWABLE TABLET: 81 mg | ORAL | Qty: 1

## 2015-07-26 MED FILL — LOVENOX 40 MG/0.4 ML SUBCUTANEOUS SYRINGE: 40 mg/0.4 mL | SUBCUTANEOUS | Qty: 0.4

## 2015-07-26 MED FILL — IPRATROPIUM-ALBUTEROL 2.5 MG-0.5 MG/3 ML NEB SOLUTION: 2.5 mg-0.5 mg/3 ml | RESPIRATORY_TRACT | Qty: 3

## 2015-07-26 MED FILL — AMLODIPINE 5 MG TAB: 5 mg | ORAL | Qty: 2

## 2015-07-26 MED FILL — FUROSEMIDE 10 MG/ML IJ SOLN: 10 mg/mL | INTRAMUSCULAR | Qty: 4

## 2015-07-26 MED FILL — CALCIUM CARBONATE 500 MG (1250 MG) TAB: 500 mg calcium (1,250 mg) | ORAL | Qty: 1

## 2015-07-26 MED FILL — CLONIDINE 0.1 MG TAB: 0.1 mg | ORAL | Qty: 1

## 2015-07-26 MED FILL — SOLU-MEDROL (PF) 40 MG/ML SOLUTION FOR INJECTION: 40 mg/mL | INTRAMUSCULAR | Qty: 1

## 2015-07-26 MED FILL — SOLU-MEDROL (PF) 125 MG/2 ML SOLUTION FOR INJECTION: 125 mg/2 mL | INTRAMUSCULAR | Qty: 2

## 2015-07-26 MED FILL — DEFINITY 1.1 MG/ML INTRAVENOUS SUSPENSION: 1.1 mg/mL | INTRAVENOUS | Qty: 2

## 2015-07-26 MED FILL — BD POSIFLUSH NORMAL SALINE 0.9 % INJECTION SYRINGE: INTRAMUSCULAR | Qty: 20

## 2015-07-26 MED FILL — ISOSORBIDE DINITRATE 20 MG TAB: 20 mg | ORAL | Qty: 2

## 2015-07-26 MED FILL — INSULIN LISPRO 100 UNIT/ML INJECTION: 100 unit/mL | SUBCUTANEOUS | Qty: 1

## 2015-07-26 NOTE — ED Triage Notes (Signed)
Patient is 84% on nasal cannula 4 L. She wears oxygen at home at 4 L

## 2015-07-26 NOTE — Progress Notes (Signed)
Verbal report to Marcela using SBAR report I had just recieved from ER nurse as patient is on the way up from ER.

## 2015-07-26 NOTE — ED Triage Notes (Signed)
Arrived by EMS. Given report at 0316.

## 2015-07-26 NOTE — ED Notes (Signed)
Radiology here to do chest X-ray

## 2015-07-26 NOTE — ED Notes (Signed)
Provider at bedside

## 2015-07-26 NOTE — ED Notes (Signed)
Respiratory at bedside for ABG

## 2015-07-26 NOTE — Progress Notes (Signed)
Spoke to Dr Cicero Duck  Because pt had 8 runs of V tach , MD gave order for lab works all cal with results, note blood sugar is high i will call her back

## 2015-07-26 NOTE — Other (Signed)
TRANSFER - OUT REPORT:    Verbal report given to Page Spiro, RN(name) on Brittney Shelton  being transferred to 3108(unit) for routine progression of care       Report consisted of patient???s Situation, Background, Assessment and   Recommendations(SBAR).     Information from the following report(s) SBAR, Kardex, ED Summary and MAR was reviewed with the receiving nurse.    Lines:   Peripheral IV 07/26/15 Left Hand (Active)   Site Assessment Clean, dry, & intact 07/26/2015  5:13 AM   Phlebitis Assessment 0 07/26/2015  5:13 AM   Infiltration Assessment 0 07/26/2015  5:13 AM   Dressing Status Clean, dry, & intact 07/26/2015  5:13 AM        Opportunity for questions and clarification was provided.      Patient transported with:   [employer]

## 2015-07-26 NOTE — ED Notes (Signed)
65 y.o. female with h/o DM, CHF, COPD on 4L chronic O2 who presents via EMS to the ED with c/o hypoglycemia and SOB. Per EMS, on their arrival pt was satting in the 80s on her baseline 4L and so placed her on NRB with improvement to 93%. They also noted a blood sugar of 29 on arrival. EMS tried to give oral glucose but she did not tolerate it well and so they gave 1 amp D50 with improvement to 99. Pt noted to be diaphoretic but denies any CP, nausea, or vomiting.

## 2015-07-26 NOTE — ED Notes (Signed)
Dr. Lourdes Sledge talked to patient about admission information

## 2015-07-26 NOTE — H&P (Addendum)
Hospitalist Admission Note    NAME: Brittney Shelton   DOB:  [birth year]   MRN:  [id number]     Date/Time:  07/26/2015 8:18 AM    Patient PCP: Ermalene Postin, MD  ________________________________________________________________________    My assessment of this patient's clinical condition and my plan of care is as follows.    Assessment / Plan:  Acute on chronic respiratory failure ( baseline 4 litres)  R/o CHF exacerbation ( unknown EF) with interstitial edema  R/o copd exacerbation less likely    Iv diuresis, i/o monitoring, daily weights  Check bnp, and echo  Chest xray cardiomegaly and interstitial edema  bronchodilators and empiric steroids, no need for antibiotics at this time  Would taper off the steroids soon  abg 7.34 pco2 57,( alert oriented,) taper off the NRB  Low threshold to use bipap if needed  Does not remember her pulmonary doctor name    Hypoglycemia in a patient with diabetes  ( last hba1c 5.4)  Causing encephalopathy resolved  PTA on metformin and amaryl)  Took the meds yesterday,   poc every 3 hours for next few hours, if stable, change to poc and achs  No signss of infection at this time  ua neg, and chest xray neg      Hypertension  lymphedema    PTA noravsc,clonidine, lasix, imdur, lisinopril and metoprolol200  Decreased metoprolol to 100, and resume others  Monitor    Lactic acid elevated, not sure of the cause  Does not look septic at this time  Repeat  In few hours      Code Status: full  Surrogate Decision Maker:    DVT Prophylaxis: lovenox  GI Prophylaxis: not indicated    Baseline:fair functional status        Subjective:   CHIEF COMPLAINT: low BS    HISTORY OF PRESENT ILLNESS:     Rosabel Sermeno is a 65 y.o. 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 Brought here  On NRB, pt denies any chest pain or badominal pain    We were asked to admit for work up and evaluation of the above problems.     Past Medical History:   Diagnosis Date   ??? Chronic obstructive pulmonary disease ([place])    ??? Congestive heart failure ([place])    ??? Diabetes ([place])    ??? Hypertension         No past surgical history on file.    Social History   Substance Use Topics   ??? Smoking status: Never Smoker   ??? Smokeless tobacco: Never Used   ??? Alcohol use No        Family History   Problem Relation Age of Onset   ??? Diabetes Mother    ??? Arthritis-osteo Father    ??? COPD Sister    ??? Diabetes Brother    ??? Diabetes Brother  No Known Allergies     Prior to Admission medications    Medication Sig Start Date End Date Taking? Authorizing Provider   furosemide (LASIX) 40 mg tablet Take 1 tablet two times a day.  Indications: Edema 06/19/15   Ermalene Postin, MD   amLODIPine (NORVASC) 10 mg tablet Take 1 Tab by mouth daily. 02/21/15   Ermalene Postin, MD   metoprolol succinate (TOPROL-XL) 200 mg XL tablet TAKE 1 TABLET BY MOUTH DAILY FOR HIGH BLOOD PRESSURE 02/19/15   Ermalene Postin, MD   metFORMIN (GLUCOPHAGE) 1,000 mg tablet TAKE 1 TABLET BY MOUTH TWICE DAILY WITH MEALS 02/19/15   Nau D Domah, MD   lisinopril (PRINIVIL, ZESTRIL) 40 mg tablet TAKE 1 TABLET BY MOUTH ONCE DAILY 02/19/15   Ermalene Postin, MD   isosorbide dinitrate (ISORDIL) 30 mg tablet TAKE 1 TABLET BY MOUTH THREE TIMES DAILY 02/19/15   Ermalene Postin, MD   glimepiride (AMARYL) 4 mg tablet TAKE 1 TABLET BY MOUTH ONCE EVERY MORNING 02/19/15   Ermalene Postin, MD   cloNIDine HCl (CATAPRES) 0.1 mg tablet TAKE 1 TABLET BY MOUTH ONCE DAILY 02/19/15   Ermalene Postin, MD    pravastatin (PRAVACHOL) 20 mg tablet Take 1 Tab by mouth nightly. 10/10/14   Ermalene Postin, MD   aspirin 81 mg chewable tablet Take 81 mg by mouth daily.    Historical Provider   calcium carbonate (OS-CAL) 500 mg calcium (1,250 mg) tablet Take  by mouth daily.    Historical Provider   Oxygen     Historical Provider       REVIEW OF SYSTEMS:       Total of 12 systems reviewed as follows:       POSITIVE= underlined text  Negative = text not underlined  General:  fever, chills, sweats, generalized weakness, weight loss/gain,      loss of appetite   Eyes:    blurred vision, eye pain, loss of vision, double vision  ENT:    rhinorrhea, pharyngitis   Respiratory:   cough, sputum production, SOB, DOE, wheezing, pleuritic pain   Cardiology:   chest pain, palpitations, orthopnea, PND, edema, syncope   Gastrointestinal:  abdominal pain , N/V, diarrhea, dysphagia, constipation, bleeding   Genitourinary:  frequency, urgency, dysuria, hematuria, incontinence   Muskuloskeletal :  arthralgia, myalgia, back pain  Hematology:  easy bruising, nose or gum bleeding, lymphadenopathy   Dermatological: rash, ulceration, pruritis, color change / jaundice  Endocrine:   hot flashes or polydipsia   Neurological:  headache, dizziness, confusion, focal weakness, paresthesia,     Speech difficulties, memory loss, gait difficulty  Psychological: Feelings of anxiety, depression, agitation    Objective:   VITALS:    Visit Vitals   ??? BP 166/83   ??? Pulse 65   ??? Temp 98 ??F (36.7 ??C)   ??? Resp 18   ??? SpO2 93%       PHYSICAL EXAM:    General:    Alert, cooperative, no distress, appears stated age.     HEENT: Atraumatic, anicteric sclerae, pink conjunctivae     No oral ulcers, mucosa moist, throat clear, dentition fair  Neck:  Supple, symmetrical,  thyroid: non tender  Lungs:   B/l decreased air entry, diffuse crackles. No rales.  Chest wall:  No tenderness  No Accessory muscle use.  Heart:   Regular  rhythm,  No  murmur   No edema   Abdomen:   Soft, non-tender.  Not distended.  Bowel sounds normal  Extremities: No cyanosis.  No clubbing      Capillary refill normal,  Radial pulse 2+,  DP2+  Skin:     Not pale.  Not Jaundiced  No rashes   Psych:  Good insight.  Not depressed.  Not anxious or agitated.  Neurologic: EOMs intact. No facial asymmetry. No aphasia or slurred speech. Symmetrical strength, Sensation grossly intact. Alert and oriented X 4.     _______________________________________________________________________  Care Plan discussed with:    Comments   Patient y    Family      RN y    Scientist, forensic:      _______________________________________________________________________  Expected  Disposition:   Home with Family y   HH/PT/OT/RN    SNF/LTC    [facility]    ________________________________________________________________________  TOTAL TIME: 22 Minutes    Critical Care Provided     Minutes non procedure based      Comments    y Reviewed previous records   >50% of visit spent in counseling and coordination of care y Discussion with patient and/or family and questions answered       ________________________________________________________________________  Signed: Anise Salvo, MD    Procedures: see electronic medical records for all procedures/Xrays and details which were not copied into this note but were reviewed prior to creation of Plan.    LAB DATA REVIEWED:    Recent Results (from the past 24 hour(s))   GLUCOSE, POC    Collection Time: 07/26/15  3:16 AM   Result Value Ref Range    Glucose (POC) 133 (H) 65 - 100 mg/dL    Performed by [facility]    EKG, 12 LEAD, INITIAL    Collection Time: 07/26/15  3:41 AM   Result Value Ref Range    Ventricular Rate 63 BPM    Atrial Rate 63 BPM    P-R Interval 200 ms    QRS Duration 96 ms    Q-T Interval 462 ms    QTC Calculation (Bezet) 472 ms    Calculated P Axis 56 degrees    Calculated R Axis 4 degrees    Calculated T Axis 22 degrees     Diagnosis       Sinus rhythm with fusion complexes  No previous ECGs available     BLOOD GAS, ARTERIAL    Collection Time: 07/26/15  3:57 AM   Result Value Ref Range    pH 7.34 (L) 7.35 - 7.45      PCO2 57 (H) 35.0 - 45.0 mmHg    PO2 83 80 - 100 mmHg    O2 SAT 95 92 - 97 %    BICARBONATE 30 (H) 22 - 26 mmol/L    BASE EXCESS 2.6 mmol/L    O2 METHOD NRM      O2 FLOW RATE 15.00 L/min    SPONTANEOUS RATE 16.0      Sample source ARTERIAL      SITE RIGHT RADIAL      ALLEN'S TEST YES     CBC WITH AUTOMATED DIFF    Collection Time: 07/26/15  3:59 AM   Result Value Ref Range    WBC 10.6 3.6 - 11.0 K/uL    RBC 4.32 3.80 - 5.20 M/uL    HGB 12.2 11.5 - 16.0 g/dL    HCT 39.4  35.0 - 47.0 %    MCV 91.2 80.0 - 99.0 FL    MCH 28.2 26.0 - 34.0 PG    MCHC 31.0 30.0 - 36.5 g/dL    RDW 15.5 (H) 11.5 - 14.5 %    PLATELET 225 150 - 400 K/uL    NEUTROPHILS 84 (H) 32 - 75 %    LYMPHOCYTES 9 (L) 12 - 49 %    MONOCYTES 5 5 - 13 %    EOSINOPHILS 2 0 - 7 %    BASOPHILS 0 0 - 1 %    ABS. NEUTROPHILS 8.8 (H) 1.8 - 8.0 K/UL    ABS. LYMPHOCYTES 1.0 0.8 - 3.5 K/UL    ABS. MONOCYTES 0.6 0.0 - 1.0 K/UL    ABS. EOSINOPHILS 0.2 0.0 - 0.4 K/UL    ABS. BASOPHILS 0.0 0.0 - 0.1 K/UL   CK W/ CKMB & INDEX    Collection Time: 07/26/15  3:59 AM   Result Value Ref Range    CK 75 26 - 192 U/L    CK - MB <1.0 <3.6 NG/ML    CK-MB Index CANNOT BE CALCULATED 0 - 2.5     METABOLIC PANEL, COMPREHENSIVE    Collection Time: 07/26/15  3:59 AM   Result Value Ref Range    Sodium 141 136 - 145 mmol/L    Potassium 3.5 3.5 - 5.1 mmol/L    Chloride 101 97 - 108 mmol/L    CO2 34 (H) 21 - 32 mmol/L    Anion gap 6 5 - 15 mmol/L    Glucose 179 (H) 65 - 100 mg/dL    BUN 22 (H) 6 - 20 MG/DL    Creatinine 1.00 0.55 - 1.02 MG/DL    BUN/Creatinine ratio 22 (H) 12 - 20      GFR est AA >60 >60 ml/min/1.68m    GFR est non-AA 55 (L) >60 ml/min/1.720m   Calcium 8.0 (L) 8.5 - 10.1 MG/DL    Bilirubin, total 0.5 0.2 - 1.0 MG/DL    ALT (SGPT) 11 (L) 12 - 78 U/L     AST (SGOT) 14 (L) 15 - 37 U/L    Alk. phosphatase 70 45 - 117 U/L    Protein, total 8.0 6.4 - 8.2 g/dL    Albumin 3.4 (L) 3.5 - 5.0 g/dL    Globulin 4.6 (H) 2.0 - 4.0 g/dL    A-G Ratio 0.7 (L) 1.1 - 2.2     PRO-BNP    Collection Time: 07/26/15  3:59 AM   Result Value Ref Range    NT pro-BNP 597 (H) 0 - 125 PG/ML   PROTHROMBIN TIME + INR    Collection Time: 07/26/15  3:59 AM   Result Value Ref Range    INR 1.0 0.9 - 1.1      Prothrombin time 10.2 9.0 - 11.1 sec   PTT    Collection Time: 07/26/15  3:59 AM   Result Value Ref Range    aPTT 34.7 (H) 22.1 - 32.5 sec    aPTT, therapeutic range     58.0 - 77.0 SECS   TROPONIN I    Collection Time: 07/26/15  3:59 AM   Result Value Ref Range    Troponin-I, Qt. <0.04 <0.05 ng/mL   INFLUENZA A & B AG (RAPID TEST)    Collection Time: 07/26/15  3:59 AM   Result Value Ref Range    Influenza A Antigen NEGATIVE  NEG  Influenza B Antigen NEGATIVE  NEG     LACTIC ACID, PLASMA    Collection Time: 07/26/15  3:59 AM   Result Value Ref Range    Lactic acid 2.4 (HH) 0.4 - 2.0 MMOL/L   URINALYSIS W/ REFLEX CULTURE    Collection Time: 07/26/15  5:16 AM   Result Value Ref Range    Color YELLOW/STRAW      Appearance CLOUDY (A) CLEAR      Specific gravity 1.015 1.003 - 1.030      pH (UA) 6.0 5.0 - 8.0      Protein NEGATIVE  NEG mg/dL    Glucose NEGATIVE  NEG mg/dL    Ketone NEGATIVE  NEG mg/dL    Bilirubin NEGATIVE  NEG      Blood NEGATIVE  NEG      Urobilinogen 0.2 0.2 - 1.0 EU/dL    Nitrites NEGATIVE  NEG      Leukocyte Esterase NEGATIVE  NEG      WBC 0-4 0 - 4 /hpf    RBC 0-5 0 - 5 /hpf    Epithelial cells FEW FEW /lpf    Bacteria NEGATIVE  NEG /hpf    UA:UC IF INDICATED CULTURE NOT INDICATED BY UA RESULT CNI      Hyaline cast 0-2 0 - 5 /lpf

## 2015-07-26 NOTE — Progress Notes (Signed)
TRANSFER - IN REPORT:    Verbal report received from [facility] RN on Brittney Shelton  being received from ER for routine progression of care      Report consisted of patient???s Situation, Background, Assessment and   Recommendations(SBAR).     Information from the following report(s) SBAR, Kardex, Recent Results and Med Rec Status was reviewed with the receiving nurse.    Opportunity for questions and clarification was provided.      Assessment completed upon patient???s arrival to unit and care assumed.

## 2015-07-26 NOTE — Progress Notes (Signed)
Bedside and Verbal shift change report given to Marcela RN (oncoming nurse) by Gildardo Griffes RN (offgoing nurse). Report included the following information SBAR, Kardex and Recent Results.

## 2015-07-26 NOTE — Progress Notes (Signed)
Echo with Definity completed by Kathy Van Kleeck

## 2015-07-26 NOTE — ED Notes (Signed)
Patient's oxygen saturation declined with activity to 88%. Patient put back on non-rebreather mask

## 2015-07-26 NOTE — ED Provider Notes (Signed)
HPI Comments: Brittney Shelton is a 65 y.o. female with h/o DM, CHF, COPD on 4L chronic O2 who presents via EMS to the ED with c/o hypoglycemia and SOB x this evening. Per EMS, on their arrival pt was satting in the 80s on her baseline 4L and so placed her on NRB with improvement to 93%. They also noted a FSBG of 29 on arrival. EMS tried to give oral glucose but she did not tolerate it well and so they gave 1 amp D50 with improvement to 99 en route. Pt noted to be diaphoretic but denies any CP, nausea, or vomiting.     PCP: Ermalene Postin, MD  PMHx significant for: COPD, CHF, HTN, DM  PSHx significant for: pt denies   Social Hx:  smoking (-)                      alcohol (-)                        There are no other complaints, changes, or physical findings at this time.  Written by Charlie Pitter, ED scribe, as dictated by Glean Hess, MD     The history is provided by the patient.        Past Medical History:   Diagnosis Date   ??? Chronic obstructive pulmonary disease ([place])    ??? Congestive heart failure ([place])    ??? Diabetes ([place])    ??? Hypertension        No past surgical history on file.      Family History:   Problem Relation Age of Onset   ??? Diabetes Mother    ??? Arthritis-osteo Father    ??? COPD Sister    ??? Diabetes Brother    ??? Diabetes Brother        Social History     Social History   ??? Marital status: SINGLE     Spouse name: N/A   ??? Number of children: N/A   ??? Years of education: N/A     Occupational History   ??? Not on file.     Social History Main Topics   ??? Smoking status: Never Smoker   ??? Smokeless tobacco: Never Used   ??? Alcohol use No   ??? Drug use: No   ??? Sexual activity: No     Other Topics Concern   ??? Not on file     Social History Narrative         ALLERGIES: Review of patient's allergies indicates no known allergies.    Review of Systems   Constitutional: Negative.  Negative for fever.   Eyes: Negative.    Respiratory: Positive for shortness of breath.    Cardiovascular: Negative for chest pain.    Gastrointestinal: Negative for abdominal pain, nausea and vomiting.   Endocrine: Negative.    Genitourinary: Negative.  Negative for difficulty urinating, dysuria and hematuria.   Musculoskeletal: Negative.    Skin: Negative.    Allergic/Immunologic: Negative.    Neurological: Negative.    Psychiatric/Behavioral: Negative for suicidal ideas.   All other systems reviewed and are negative.      Patient Vitals for the past 12 hrs:   Temp Pulse Resp BP SpO2   07/26/15 0518 - 67 22 158/87 97 %   07/26/15 0502 - 71 22 180/83 (!) 89 %   07/26/15 0452 - 71 21 - 92 %   07/26/15 0445 -  65 19 148/71 100 %   07/26/15 0415 - 63 17 142/64 100 %   07/26/15 0401 - 66 18 148/88 100 %   07/26/15 0345 - - - - 98 %   07/26/15 0345 - 65 22 149/79 99 %   07/26/15 0330 - 66 27 137/77 100 %   07/26/15 0319 - - - 181/81 -   07/26/15 0318 - 75 (!) 0 181/81 (!) 83 %   07/26/15 0315 98 ??F (36.7 ??C) 72 - - (!) 82 %   07/26/15 0311 97.5 ??F (36.4 ??C) - - - -             Physical Exam   Constitutional: She is oriented to person, place, and time. She appears well-developed and well-nourished. She appears distressed.   HENT:   Head: Normocephalic and atraumatic.   Nose: Nose normal.   Eyes: Conjunctivae and EOM are normal. No scleral icterus.   Neck: Normal range of motion. No tracheal deviation present.   Cardiovascular: Normal rate, regular rhythm, normal heart sounds and intact distal pulses.  Exam reveals no friction rub.    No murmur heard.  Pulmonary/Chest: Accessory muscle usage present. No stridor. Tachypnea noted. She is in respiratory distress. She has decreased breath sounds in the right lower field and the left lower field. She has no wheezes. She has no rhonchi. She has no rales.   Abdominal: Soft. Bowel sounds are normal. She exhibits no distension. There is no tenderness. There is no rebound.   Musculoskeletal: Normal range of motion. She exhibits no tenderness.   Neurological: She is alert and oriented to person, place, and time. No  cranial nerve deficit.   Skin: Skin is warm. No rash noted. She is diaphoretic.   Psychiatric: She has a normal mood and affect. Her behavior is normal. Judgment and thought content normal.   Nursing note and vitals reviewed.       MDM  Number of Diagnoses or Management Options  Acute on chronic respiratory failure with hypoxia ([place]):   Hypoxia:   Diagnosis management comments: DDX:  Hypoglycemia, acute on chronic respiratory failure, pna, acs, chf, pleural effusion, sepsis, uti    Plan:  Ekg, labs, o2, abg, lactate, cultures, cxr, ua, dextrose prn, flu swab    Impression:  Hypoxia, acute on chronic respiratory failure, chf exacerbation         Amount and/or Complexity of Data Reviewed  Clinical lab tests: ordered and reviewed  Tests in the radiology section of CPT??: ordered and reviewed  Tests in the medicine section of CPT??: reviewed and ordered  Obtain history from someone other than the patient: yes (EMS)  Review and summarize past medical records: yes  Discuss the patient with other providers: yes (EMS, hospitalist)  Independent visualization of images, tracings, or specimens: yes    Critical Care  Total time providing critical care: 75-105 minutes    ED Course       Procedures    I reviewed our electronic medical record system for any past medical records that were available that may contribute to the patients current condition, the nursing notes and and vital signs from today's visit    Nursing notes will be reviewed as they become available in realtime while the pt is in the ED.  Glean Hess, MD     3:20 AM  Pt's rate is 70 with a normal rhythm as noted on Cardiac monitor.  SpO2 is 96% on NRB  Written by Roderic Palau  Donald Pore, ED scribe, as dictated by Glean Hess, MD       EKG interpretation [phone]: NSR, nl Axis, rate 63; PR 200, QRS 96, QTc 472; no acute ischemia; Glean Hess, MD    Progress Note:  4:40 AM  Lactate noted to be elevated at 2.4. Given her respiratory complaints, h/o  CHF, and CXR findings of cardiomegaly with mild interstitial edema, I discussed with her the risks vs benefits of treating her lactate with aggressive IV fluid resuscitation. Pt refused the IV fluids. Will also provide a dose of Lasix.   Written by Dorcas Carrow Fu, ED scribe, as dictated by Glean Hess, MD      Progress Note:  4:45 AM  Pt has been maintaining her sats on NRB and is feeling better. Will trial her on her baseline 4L NC.   Written by Dorcas Carrow Fu, ED scribe, as dictated by Glean Hess, MD      Progress Note:  5:10 AM  Nursing notes pt had been maintaining her sats well on baseline 4L NC but when she got up to use the bedside commode, her sats dropped down to the 70s on 4L. They have replaced her on NRB. Will provide a neb tx/steroids, speak with hospitalist for admission  Written by Dorcas Carrow. Fu, ED scribe, as dictated by Glean Hess, MD      CONSULT NOTE:   5:26 AM  Glean Hess, MD spoke to Dr. Manuella Ghazi  Specialty: Hospitalist  Discussed pt's HPI and available diagnostic results thus far. Expressed concerns for needed admission. She will see and evaluate for admission  Written by Charlie Pitter, ED scribe, as dictated by Glean Hess, MD     Progress Note:  5:31 AM  Updated pt on all results and the plan for admission. She is in Engineer, water by Dorcas Carrow. Fu, ED scribe, as dictated by Glean Hess, MD    I personally reviewed pt's imaging. Official read by radiology listed below.  Glean Hess, MD    CRITICAL CARE NOTE :    IMPENDING DETERIORATION - Airway, Respiratory, Cardiovascular, CNS and Metabolic  ASSOCIATED RISK FACTORS - Hypotension, Shock, Dysrhythmia, Metabolic changes, Dehydration, Vascular Compromise and CNS Decompensation  MANAGEMENT - Bedside Assessment and Supervision of Care  INTERPRETATION - Xrays, Blood Gases, ECG and Blood Pressure  INTERVENTIONS - hemodynamic mngmt, vascular control, Neurologic interventions  and Metobolic interventions   CASE REVIEW - Hospitalist and Nursing  TREATMENT RESPONSE - Improved  PERFORMED BY - Self    NOTES   :    I have spent 75-105 minutes of critical care time involved in lab review, consultations with specialist, family decision- making, bedside attention and documentation. During this entire length of time I was immediately available to the patient .  Glean Hess, MD     LABORATORY TESTS:  Recent Results (from the past 12 hour(s))   GLUCOSE, POC    Collection Time: 07/26/15  3:16 AM   Result Value Ref Range    Glucose (POC) 133 (H) 65 - 100 mg/dL    Performed by [facility]    EKG, 12 LEAD, INITIAL    Collection Time: 07/26/15  3:41 AM   Result Value Ref Range    Ventricular Rate 63 BPM    Atrial Rate 63 BPM    P-R Interval 200 ms    QRS Duration 96 ms    Q-T Interval 462  ms    QTC Calculation (Bezet) 472 ms    Calculated P Axis 56 degrees    Calculated R Axis 4 degrees    Calculated T Axis 22 degrees    Diagnosis       Sinus rhythm with fusion complexes  No previous ECGs available     BLOOD GAS, ARTERIAL    Collection Time: 07/26/15  3:57 AM   Result Value Ref Range    pH 7.34 (L) 7.35 - 7.45      PCO2 57 (H) 35.0 - 45.0 mmHg    PO2 83 80 - 100 mmHg    O2 SAT 95 92 - 97 %    BICARBONATE 30 (H) 22 - 26 mmol/L    BASE EXCESS 2.6 mmol/L    O2 METHOD NRM      O2 FLOW RATE 15.00 L/min    SPONTANEOUS RATE 16.0      Sample source ARTERIAL      SITE RIGHT RADIAL      ALLEN'S TEST YES     CBC WITH AUTOMATED DIFF    Collection Time: 07/26/15  3:59 AM   Result Value Ref Range    WBC 10.6 3.6 - 11.0 K/uL    RBC 4.32 3.80 - 5.20 M/uL    HGB 12.2 11.5 - 16.0 g/dL    HCT 39.4 35.0 - 47.0 %    MCV 91.2 80.0 - 99.0 FL    MCH 28.2 26.0 - 34.0 PG    MCHC 31.0 30.0 - 36.5 g/dL    RDW 15.5 (H) 11.5 - 14.5 %    PLATELET 225 150 - 400 K/uL    NEUTROPHILS 84 (H) 32 - 75 %    LYMPHOCYTES 9 (L) 12 - 49 %    MONOCYTES 5 5 - 13 %    EOSINOPHILS 2 0 - 7 %    BASOPHILS 0 0 - 1 %    ABS. NEUTROPHILS 8.8 (H) 1.8 - 8.0 K/UL     ABS. LYMPHOCYTES 1.0 0.8 - 3.5 K/UL    ABS. MONOCYTES 0.6 0.0 - 1.0 K/UL    ABS. EOSINOPHILS 0.2 0.0 - 0.4 K/UL    ABS. BASOPHILS 0.0 0.0 - 0.1 K/UL   CK W/ CKMB & INDEX    Collection Time: 07/26/15  3:59 AM   Result Value Ref Range    CK 75 26 - 192 U/L    CK - MB <1.0 <3.6 NG/ML    CK-MB Index CANNOT BE CALCULATED 0 - 2.5     METABOLIC PANEL, COMPREHENSIVE    Collection Time: 07/26/15  3:59 AM   Result Value Ref Range    Sodium 141 136 - 145 mmol/L    Potassium 3.5 3.5 - 5.1 mmol/L    Chloride 101 97 - 108 mmol/L    CO2 34 (H) 21 - 32 mmol/L    Anion gap 6 5 - 15 mmol/L    Glucose 179 (H) 65 - 100 mg/dL    BUN 22 (H) 6 - 20 MG/DL    Creatinine 1.00 0.55 - 1.02 MG/DL    BUN/Creatinine ratio 22 (H) 12 - 20      GFR est AA >60 >60 ml/min/1.66m    GFR est non-AA 55 (L) >60 ml/min/1.751m   Calcium 8.0 (L) 8.5 - 10.1 MG/DL    Bilirubin, total 0.5 0.2 - 1.0 MG/DL    ALT (SGPT) 11 (L) 12 - 78 U/L    AST (SGOT) 14 (L)  15 - 37 U/L    Alk. phosphatase 70 45 - 117 U/L    Protein, total 8.0 6.4 - 8.2 g/dL    Albumin 3.4 (L) 3.5 - 5.0 g/dL    Globulin 4.6 (H) 2.0 - 4.0 g/dL    A-G Ratio 0.7 (L) 1.1 - 2.2     PRO-BNP    Collection Time: 07/26/15  3:59 AM   Result Value Ref Range    NT pro-BNP 597 (H) 0 - 125 PG/ML   PROTHROMBIN TIME + INR    Collection Time: 07/26/15  3:59 AM   Result Value Ref Range    INR 1.0 0.9 - 1.1      Prothrombin time 10.2 9.0 - 11.1 sec   PTT    Collection Time: 07/26/15  3:59 AM   Result Value Ref Range    aPTT 34.7 (H) 22.1 - 32.5 sec    aPTT, therapeutic range     58.0 - 77.0 SECS   TROPONIN I    Collection Time: 07/26/15  3:59 AM   Result Value Ref Range    Troponin-I, Qt. <0.04 <0.05 ng/mL   INFLUENZA A & B AG (RAPID TEST)    Collection Time: 07/26/15  3:59 AM   Result Value Ref Range    Influenza A Antigen NEGATIVE  NEG      Influenza B Antigen NEGATIVE  NEG     LACTIC ACID, PLASMA    Collection Time: 07/26/15  3:59 AM   Result Value Ref Range    Lactic acid 2.4 (HH) 0.4 - 2.0 MMOL/L    URINALYSIS W/ REFLEX CULTURE    Collection Time: 07/26/15  5:16 AM   Result Value Ref Range    Color YELLOW/STRAW      Appearance CLOUDY (A) CLEAR      Specific gravity 1.015 1.003 - 1.030      pH (UA) 6.0 5.0 - 8.0      Protein NEGATIVE  NEG mg/dL    Glucose NEGATIVE  NEG mg/dL    Ketone NEGATIVE  NEG mg/dL    Bilirubin NEGATIVE  NEG      Blood NEGATIVE  NEG      Urobilinogen 0.2 0.2 - 1.0 EU/dL    Nitrites NEGATIVE  NEG      Leukocyte Esterase NEGATIVE  NEG      WBC 0-4 0 - 4 /hpf    RBC 0-5 0 - 5 /hpf    Epithelial cells FEW FEW /lpf    Bacteria NEGATIVE  NEG /hpf    UA:UC IF INDICATED CULTURE NOT INDICATED BY UA RESULT CNI      Hyaline cast 0-2 0 - 5 /lpf       IMAGING RESULTS:  CXR Results  (Last 48 hours)               07/26/15 0421  XR CHEST PORT Final result    Impression:  IMPRESSION:   Cardiomegaly with mild interstitial edema       Narrative:  INDICATION: Shortness of breath on home oxygen       COMPARISON: None       A single frontal view was obtained. The time of this study is 0410 hours.   Cardiomegaly is noted. There is mild interstitial edema present.Marland Kitchen                           MEDICATIONS GIVEN:  Medications   furosemide (LASIX) injection  40 mg (40 mg IntraVENous Given 07/26/15 0500)   albuterol-ipratropium (DUO-NEB) 2.5 MG-0.5 MG/3 ML (3 mL Nebulization Given 07/26/15 0545)   methylPREDNISolone (PF) (SOLU-MEDROL) injection 125 mg (125 mg IntraVENous Given 07/26/15 0545)       IMPRESSION:  1. Hypoxia    2. Acute on chronic respiratory failure with hypoxia (HCC)        PLAN:  1. Admit to medicine    ADMIT NOTE    Pt will be admitted by Dr. Manuella Ghazi. All diagnostic results and reasons for admission have been reviewed with pt and any family. Care plan has been outlined with pt and any family. Pt and all family agree and understand conditions of admission. All of pt's questions have been answered. There are no further findings. Pt is ready to be admitted.     This note is prepared by Charlie Pitter, acting as scribe for Glean Hess, MD.     Glean Hess, MD: The scribe's documentation has been prepared under my direction and personally reviewed by me in its entirety. I confirm that the note above accurately reflects all work, treatment, procedures, and medical decision making performed by me                                       This note will not be viewable in [place].

## 2015-07-26 NOTE — Progress Notes (Signed)
Called re: 8 bt run of vtach, chart reviewed pt with acute and chronic hypoxic respiratory failure  -check bmp, mg, phos, trop, am echo  RN called back with elevated BS of nearly 400, additional insulin given.

## 2015-07-26 NOTE — ED Notes (Signed)
Hospitalist is at the bedside.

## 2015-07-26 NOTE — ED Notes (Signed)
Non-rebreather was changed to NC at about 0800 and pt has been hold Sats at 95 % consistently

## 2015-07-27 ENCOUNTER — Inpatient Hospital Stay: Admit: 2015-07-27 | Payer: MEDICARE | Primary: Internal Medicine

## 2015-07-27 LAB — CBC W/O DIFF
HCT: 33.8 % — ABNORMAL LOW (ref 35.0–47.0)
HGB: 11.1 g/dL — ABNORMAL LOW (ref 11.5–16.0)
MCH: 29.1 PG (ref 26.0–34.0)
MCHC: 32.8 g/dL (ref 30.0–36.5)
MCV: 88.5 FL (ref 80.0–99.0)
PLATELET: 185 10*3/uL (ref 150–400)
RBC: 3.82 M/uL (ref 3.80–5.20)
RDW: 15.4 % — ABNORMAL HIGH (ref 11.5–14.5)
WBC: 9.8 10*3/uL (ref 3.6–11.0)

## 2015-07-27 LAB — METABOLIC PANEL, BASIC
Anion gap: 10 mmol/L (ref 5–15)
Anion gap: 10 mmol/L (ref 5–15)
BUN/Creatinine ratio: 21 — ABNORMAL HIGH (ref 12–20)
BUN/Creatinine ratio: 28 — ABNORMAL HIGH (ref 12–20)
BUN: 29 MG/DL — ABNORMAL HIGH (ref 6–20)
BUN: 30 MG/DL — ABNORMAL HIGH (ref 6–20)
CO2: 30 mmol/L (ref 21–32)
CO2: 30 mmol/L (ref 21–32)
Calcium: 8.1 MG/DL — ABNORMAL LOW (ref 8.5–10.1)
Calcium: 8 MG/DL — ABNORMAL LOW (ref 8.5–10.1)
Chloride: 97 mmol/L (ref 97–108)
Chloride: 98 mmol/L (ref 97–108)
Creatinine: 1.35 MG/DL — ABNORMAL HIGH (ref 0.55–1.02)
Creatinine: 1.09 MG/DL — ABNORMAL HIGH (ref 0.55–1.02)
GFR est AA: 48 mL/min/{1.73_m2} — ABNORMAL LOW (ref >60)
GFR est AA: >60 mL/min/{1.73_m2} (ref >60)
GFR est non-AA: 39 mL/min/{1.73_m2} — ABNORMAL LOW (ref >60)
GFR est non-AA: 50 mL/min/{1.73_m2} — ABNORMAL LOW (ref >60)
Glucose: 390 mg/dL — ABNORMAL HIGH (ref 65–100)
Glucose: 266 mg/dL — ABNORMAL HIGH (ref 65–100)
Potassium: 3.9 mmol/L (ref 3.5–5.1)
Potassium: 4 mmol/L (ref 3.5–5.1)
Sodium: 137 mmol/L (ref 136–145)
Sodium: 138 mmol/L (ref 136–145)

## 2015-07-27 LAB — GLUCOSE, POC
Glucose (POC): 389 mg/dL — ABNORMAL HIGH (ref 65–100)
Glucose (POC): 240 mg/dL — ABNORMAL HIGH (ref 65–100)
Glucose (POC): 224 mg/dL — ABNORMAL HIGH (ref 65–100)
Glucose (POC): 253 mg/dL — ABNORMAL HIGH (ref 65–100)

## 2015-07-27 LAB — TROPONIN I: Troponin-I, Qt.: <0.04 ng/mL (ref <0.05)

## 2015-07-27 LAB — PHOSPHORUS: Phosphorus: 2.2 MG/DL — ABNORMAL LOW (ref 2.6–4.7)

## 2015-07-27 LAB — MAGNESIUM: Magnesium: 1.9 mg/dL (ref 1.6–2.4)

## 2015-07-27 MED ORDER — PREDNISONE 20 MG TAB
20 mg | Freq: Every day | ORAL | Status: DC
Start: 2015-07-27 — End: 2015-07-29
  Administered 2015-07-28 – 2015-07-29 (×2): via ORAL

## 2015-07-27 MED ORDER — INSULIN LISPRO 100 UNIT/ML INJECTION
100 unit/mL | Freq: Once | SUBCUTANEOUS | Status: AC
Start: 2015-07-27 — End: 2015-07-26
  Administered 2015-07-27: 04:00:00 via SUBCUTANEOUS

## 2015-07-27 MED FILL — IPRATROPIUM-ALBUTEROL 2.5 MG-0.5 MG/3 ML NEB SOLUTION: 2.5 mg-0.5 mg/3 ml | RESPIRATORY_TRACT | Qty: 3

## 2015-07-27 MED FILL — ISOSORBIDE DINITRATE 20 MG TAB: 20 mg | ORAL | Qty: 2

## 2015-07-27 MED FILL — CALCIUM CARBONATE 500 MG (1250 MG) TAB: 500 mg calcium (1,250 mg) | ORAL | Qty: 3

## 2015-07-27 MED FILL — INSULIN LISPRO 100 UNIT/ML INJECTION: 100 unit/mL | SUBCUTANEOUS | Qty: 1

## 2015-07-27 MED FILL — HYDRALAZINE 20 MG/ML IJ SOLN: 20 mg/mL | INTRAMUSCULAR | Qty: 1

## 2015-07-27 MED FILL — AMLODIPINE 5 MG TAB: 5 mg | ORAL | Qty: 2

## 2015-07-27 MED FILL — LISINOPRIL 20 MG TAB: 20 mg | ORAL | Qty: 1

## 2015-07-27 MED FILL — CHILDREN'S ASPIRIN 81 MG CHEWABLE TABLET: 81 mg | ORAL | Qty: 1

## 2015-07-27 MED FILL — TOPROL XL 50 MG TABLET,EXTENDED RELEASE: 50 mg | ORAL | Qty: 2

## 2015-07-27 MED FILL — LOVENOX 40 MG/0.4 ML SUBCUTANEOUS SYRINGE: 40 mg/0.4 mL | SUBCUTANEOUS | Qty: 0.4

## 2015-07-27 MED FILL — PRAVASTATIN 10 MG TAB: 10 mg | ORAL | Qty: 2

## 2015-07-27 MED FILL — BD POSIFLUSH NORMAL SALINE 0.9 % INJECTION SYRINGE: INTRAMUSCULAR | Qty: 10

## 2015-07-27 MED FILL — FUROSEMIDE 10 MG/ML IJ SOLN: 10 mg/mL | INTRAMUSCULAR | Qty: 4

## 2015-07-27 MED FILL — SOLU-MEDROL (PF) 40 MG/ML SOLUTION FOR INJECTION: 40 mg/mL | INTRAMUSCULAR | Qty: 1

## 2015-07-27 MED FILL — CLONIDINE 0.1 MG TAB: 0.1 mg | ORAL | Qty: 1

## 2015-07-27 NOTE — Progress Notes (Signed)
Attempted to call to update pt's sister in law as per patient's request at [phone]- given by patient.  Unable to reach pt's sister in law.

## 2015-07-27 NOTE — Progress Notes (Signed)
Spiritual Care Partner Volunteer visited patient in Neuro on 07/27/15.  Documented by:  Barrie Lyme, MPS, [facility], Lead Chaplain  [facility] Paging Service  [phone])

## 2015-07-27 NOTE — Progress Notes (Signed)
Occupational Therapy  Orders received and medical record reviewed.  Attempting to initiate OT Evaluation, however, nursing reports that pt is finishing her dinner and will then go to [facility] is waiting.  Will defer at this time and continue to follow.

## 2015-07-27 NOTE — Other (Deleted)
Dr. Conley Rolls, Brittney Shelton :  Please clarify if this patient is being treated/managed for:    =>Acute on chronic Diastolic CHF POA in setting of hx of CHF, interstitial edema, echo with grade 1 diastolic dysfunction, proBNP 597, BNP 349,   =>Other Explanation of clinical findings  =>Unable to Determine (no explanation of clinical findings)    The medical record reflects the following clinical findings, treatment, and risk factors:    Risk Factors: age, history of CHF  Clinical Indicators: of CHF, interstitial edema, echo with grade 1 diastolic dysfunction, proBNP 597, BNP 349,   Treatment: Lasix iv, Echo ordered, I/Os    Please clarify and document your clinical opinion in the progress notes and discharge summary including the definitive and/or presumptive diagnosis, (suspected or probable), related to the above clinical findings. Please include clinical findings supporting your diagnosis.    Thank You, Jeb Levering. Corine Shelter RN CCDS; [phone]

## 2015-07-27 NOTE — Progress Notes (Signed)
*   No surgery found *  Bedside and Verbal shift change report given to Selena Batten, RN (oncoming nurse) by Mare Loan, RN (offgoing nurse). Report included the following information SBAR, Kardex, MAR, and Recent Results  ????  Zone Phone: [phone]  ????  ????  Significant changes during shift: 1900-2300: Chest x rays, seen by palliative DR, change code status  ????  ????  ????  Patient Information  ????  Brittney Shelton  65 y.o.  07/26/2015 3:08 AM by Nestor Lewandowsky, MD. [name] was admitted from Home  ????  Problem List  ????  ?? ?? ??   Patient Active Problem List   ???? Diagnosis Date Noted   ??? Respiratory failure (HCC) 07/26/2015   ??? Hypoglycemia 07/26/2015   ??? COPD (chronic obstructive pulmonary disease) (HCC) 07/26/2015   ??? Hypertension, uncontrolled 09/29/2014   ??? Well controlled type 2 diabetes mellitus (HCC) 09/29/2014   ??? Hypercholesterolemia 09/29/2014   ??? 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 Chronic acquired lymphedema 09/29/2014   ????  ?? ?? ??   Past Medical History:   Diagnosis Date   ??? Chronic obstructive pulmonary disease (HCC) ????   ??? Congestive heart failure (HCC) ????   ??? Diabetes (HCC) ????   ??? Hypertension ????   ????  ????  ????  Core Measures:  ????  CVA: No No  CHF:yes yes  PNA:No No  ????  Post Op Surgical (If Applicable):   ????  n/a  ????  Activity Status:  ????  OOB to Chair YES  Ambulated this shift: YES  Bed Rest No  ????  Supplemental O2: (If Applicable)  ????  NC Yes  NRB No  Venti-mask No  On 4 Liters/min  ????  ????  LINES AND DRAINS:  ????  22 in the left hand, saline locked  ????  DVT prophylaxis:  ????  DVT prophylaxis Med- Yes, lovenox  DVT prophylaxis SCD or TED- No   ????  Wounds: (If Applicable)  ????  Wounds- No  ????  Location -  ????  Patient Safety:  ????  Falls Score Total Score: 4  Safety Level_______  Bed Alarm On? Yes  Sitter? No  ????  Plan for upcoming shift: nebs, monitor respiratory effort, diuretics  ????  ????  ????  Discharge Plan: Yes when stable  ????  Active Consults:  None  ????  ??

## 2015-07-27 NOTE — Other (Signed)
*   No surgery found *  Bedside and Verbal shift change report given to Cala Bradford RN (oncoming nurse) by Janeann Forehand (offgoing nurse). Report included the following information SBAR, Kardex, Recent Results, Med Rec Status and Cardiac Rhythm SR.  ??  Zone Phone: [phone]  ??  ??  Significant changes during shift:  1900-2300: Pt's blood sugars high; Blood pressure high, hydralazine was given IV, PT had 8 runs of V tach MD Dr Cicero Duck was aware and blood works was done.  2300-0700:  No changes.  Pt up to [facility] with one person assist.  Remains dyspneic.  ??  ??  ??  Patient Information  ??  Brittney Shelton  65 y.o.  07/26/2015 3:08 AM by Nestor Lewandowsky, MD. Brittney Shelton was admitted from Home  ??  Problem List  ??       Patient Active Problem List   ?? Diagnosis Date Noted   ??? Respiratory failure (HCC) 07/26/2015   ??? Hypoglycemia 07/26/2015   ??? COPD (chronic obstructive pulmonary disease) (HCC) 07/26/2015   ??? Hypertension, uncontrolled 09/29/2014   ??? Well controlled type 2 diabetes mellitus (HCC) 09/29/2014   ??? Hypercholesterolemia 09/29/2014   ??? 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 Chronic acquired lymphedema 09/29/2014   ??       Past Medical History:   Diagnosis Date   ??? Chronic obstructive pulmonary disease (HCC) ??   ??? Congestive heart failure (HCC) ??   ??? Diabetes (HCC) ??   ??? Hypertension ??   ??  ??  ??  Core Measures:  ??  CVA: No No  CHF:No No  PNA:No No  ??  Post Op Surgical (If Applicable):   ??  Number times ambulated in hallway past shift: 0  Number of times OOB to chair past shift: 0  NG Tube: No  Incentive Spirometer: No  Drains: No Volume 0  Dressing Present: No  Flatus: Not applicable  ??  Activity Status:  ??  OOB to Chair No  Ambulated this shift No   Bed Rest No  ??  Supplemental O2: (If Applicable)  ??  NC Yes  NRB No  Venti-mask No  On 4 Liters/min  ??  ??  LINES AND DRAINS:  ??  No lines or drains except peripheral IV   ??  DVT prophylaxis:  ??  DVT prophylaxis Med- Yes  DVT prophylaxis SCD or TED- No   ??   Wounds: (If Applicable)  ??  Wounds- No  ??  Location 0  ??  Patient Safety:  ??  Falls Score Total Score: 4  Safety Level_______  Bed Alarm On? Yes  Sitter? No  ??  Plan for upcoming shift: Oxygen, solumedrol, fs ac and hs, blood works  ??  ??  ??  Discharge Plan: Yes when stable  ??  Active Consults:  None  ??

## 2015-07-27 NOTE — Progress Notes (Signed)
*   No surgery found *  Bedside and Verbal shift change report given to Brett Canales RN (oncoming nurse) by Mare Loan RN (offgoing nurse). Report included the following information SBAR, Kardex, Recent Results, Med Rec Status and Cardiac Rhythm SR.    Zone Phone:   [phone]      Significant changes during shift:  Pt's blood sugars high; Blood pressure high, hydralazine was given IV, PT had 8 runs of V tach MD Dr Cicero Duck was aware and blood works was done.        Patient Information    Brittney Shelton  66 y.o.  07/26/2015  3:08 AM by Nestor Lewandowsky, MD. [name] was admitted from Home    Problem List    Patient Active Problem List    Diagnosis Date Noted   ??? Respiratory failure (HCC) 07/26/2015   ??? Hypoglycemia 07/26/2015   ??? COPD (chronic obstructive pulmonary disease) (HCC) 07/26/2015   ??? Hypertension, uncontrolled 09/29/2014   ??? Well controlled type 2 diabetes mellitus (HCC) 09/29/2014   ??? Hypercholesterolemia 09/29/2014   ??? 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 Chronic acquired lymphedema 09/29/2014     Past Medical History:   Diagnosis Date   ??? Chronic obstructive pulmonary disease (HCC)    ??? Congestive heart failure (HCC)    ??? Diabetes (HCC)    ??? Hypertension          Core Measures:    CVA: No No  CHF:No No  PNA:No No    Post Op Surgical (If Applicable):     Number times ambulated in hallway past shift:  0  Number of times OOB to chair past shift:   0  NG Tube: No  Incentive Spirometer: No  Drains: No   Volume  0  Dressing Present:  No  Flatus:  Not applicable    Activity Status:    OOB to Chair No  Ambulated this shift No   Bed Rest No    Supplemental O2: (If Applicable)    NC Yes  NRB No  Venti-mask No  On 4 Liters/min      LINES AND DRAINS:    Central Line? NO    PICC LINE? No     Urinary Catheter? No     DVT prophylaxis:    DVT prophylaxis Med- Yes  DVT prophylaxis SCD or TED- No     Wounds: (If Applicable)    Wounds- No    Location 0    Patient Safety:    Falls Score Total Score: 4   Safety Level_______  Bed Alarm On? Yes  Sitter? No    Plan for upcoming shift: Oxygen, solumedrol, fs ac and hs, blood works        Discharge Plan: Yes when stable    Active Consults:  None

## 2015-07-27 NOTE — Progress Notes (Signed)
*   No surgery found *  Bedside and Verbal shift change report given to Marcela, RN (Cabin crew) by Cala Bradford, RN (offgoing nurse). Report included the following information SBAR, Kardex, MAR, and Recent Results  ????  Zone Phone: [phone]  ????  ????  Significant changes during shift: 1900-2300: daily weight; cardiology consult called to Dr. Marcha Dutton  ????  ????  ????  Patient Information  ????  Brittney Shelton  65 y.o.  07/26/2015 3:08 AM by Nestor Lewandowsky, MD. [name] was admitted from Home  ????  Problem List  ????  ?? ?? ??   Patient Active Problem List   ???? Diagnosis Date Noted   ??? Respiratory failure (HCC) 07/26/2015   ??? Hypoglycemia 07/26/2015   ??? COPD (chronic obstructive pulmonary disease) (HCC) 07/26/2015   ??? Hypertension, uncontrolled 09/29/2014   ??? Well controlled type 2 diabetes mellitus (HCC) 09/29/2014   ??? Hypercholesterolemia 09/29/2014   ??? 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 Chronic acquired lymphedema 09/29/2014   ????  ?? ?? ??   Past Medical History:   Diagnosis Date   ??? Chronic obstructive pulmonary disease (HCC) ????   ??? Congestive heart failure (HCC) ????   ??? Diabetes (HCC) ????   ??? Hypertension ????   ????  ????  ????  Core Measures:  ????  CVA: No No  CHF:No No  PNA:No No  ????  Post Op Surgical (If Applicable):   ????  n/a  ????  Activity Status:  ????  OOB to Chair YES  Ambulated this shift: YES  Bed Rest No  ????  Supplemental O2: (If Applicable)  ????  NC Yes  NRB No  Venti-mask No  On 4 Liters/min  ????  ????  LINES AND DRAINS:  ????  22 in the left hand, saline locked  ????  DVT prophylaxis:  ????  DVT prophylaxis Med- Yes, lovenox  DVT prophylaxis SCD or TED- No   ????  Wounds: (If Applicable)  ????  Wounds- No  ????  Location -  ????  Patient Safety:  ????  Falls Score Total Score: 4  Safety Level_______  Bed Alarm On? Yes  Sitter? No  ????  Plan for upcoming shift: nebs, monitor respiratory effort  ????  ????  ????  Discharge Plan: Yes when stable  ????  Active Consults:  None  ????  ??

## 2015-07-27 NOTE — Progress Notes (Addendum)
Hospitalist Progress Note    NAME: Brittney Shelton   DOB:  [birth year]   MRN:  [id number]       Interim Hospital Summary: 65 y.o. female whom presented on 07/26/2015 with      Assessment / Plan:  Acute on chronic respiratory failure ( baseline 4L)  Acute diastolic heart failure  COPD  HTN  -Echo with nl EF, grade I diastolic HF  -continue IV lasix BID, isordil, toprol xl, norvasc, clonidine  -daily wt, I&O, monitor lytes   -nebs/PO steroids    Acute kidney injury  -likely due to volume overload  -improving   -hold nephrotoxic agents including lisinopril    Hypoglycemia in a patient with diabetes   Causing encephalopathy, resolved  -hold metformin and glimepiride due to aki  -ua neg, and chest xray neg  -SSI    Lymphedema  Lactic acidosis, resolved  -does not look septic on admission    ??  ??  Code Status: full  Surrogate Decision Maker:  ??  DVT Prophylaxis: lovenox  GI Prophylaxis: not indicated  ??  Baseline:fair functional status     Subjective:     Chief Complaint / Reason for Physician Visit  No acute complaints. Discussed with RN events overnight.     Review of Systems:  Symptom Y/N Comments  Symptom Y/N Comments   Fever/Chills n   Chest Pain n    Poor Appetite n   Edema n    Cough n   Abdominal Pain n    Sputum n   Joint Pain n    SOB/DOE n   Pruritis/Rash n    Nausea/vomit    Tolerating PT/OT     Diarrhea    Tolerating Diet     Constipation    Other       Could NOT obtain due to:      Objective:     VITALS:   Last 24hrs VS reviewed since prior progress note. Most recent are:  Patient Vitals for the past 24 hrs:   Temp Pulse Resp BP SpO2   07/27/15 1114 97.7 ??F (36.5 ??C) 70 18 143/62 94 %   07/27/15 0737 98.6 ??F (37 ??C) 71 18 138/68 95 %   07/27/15 0410 98.5 ??F (36.9 ??C) 81 18 137/64 94 %   07/27/15 0141 - - - - 91 %   07/27/15 0046 - - - - 95 %   07/27/15 0045 98.1 ??F (36.7 ??C) 77 18 138/60 96 %   07/26/15 2212 - 93 - 181/64 -   07/26/15 1950 - - - 200/70 -   07/26/15 1929 97.8 ??F (36.6 ??C) 87 18 200/77 91 %    07/26/15 1540 98.6 ??F (37 ??C) 81 18 156/66 -       Intake/Output Summary (Last 24 hours) at 07/27/15 1442  Last data filed at 07/26/15 2258   Gross per 24 hour   Intake                0 ml   Output              400 ml   Net             -400 ml        PHYSICAL EXAM:  General: WD, WN. Alert, cooperative, no acute distress????  EENT:  EOMI. Anicteric sclerae. MMM  Resp:  Decrease air entry at lung base, mild crackles.  No accessory muscle use  CV:  Regular  rhythm,??  No edema  GI:  Soft, Non distended, Non tender. ??+Bowel sounds  Neurologic:?? Alert and oriented X 3, normal speech,   Psych:???? Good insight.??Not anxious nor agitated  Skin:  No rashes.  No jaundice    Reviewed most current lab test results and cultures  YES  Reviewed most current radiology test results   YES  Review and summation of old records today    NO  Reviewed patient's current orders and MAR    YES  PMH/SH reviewed - no change compared to H&P  ________________________________________________________________________  Care Plan discussed with:    Comments   Patient     Family      RN     Care Engineer, maintenance (IT)                        Multidiciplinary team rounds were held today with case manager, nursing, pharmacist and clinical coordinator.  Patient's plan of care was discussed; medications were reviewed and discharge planning was addressed.     ________________________________________________________________________  Total NON critical care TIME:  45   Minutes    Total CRITICAL CARE TIME Spent:   Minutes non procedure based      Comments   >50% of visit spent in counseling and coordination of care     ________________________________________________________________________  Lynnea Ferrier, MD     Procedures: see electronic medical records for all procedures/Xrays and details which were not copied into this note but were reviewed prior to creation of Plan.      LABS:  I reviewed today's most current labs and imaging studies.  Pertinent labs include:   Recent Labs      07/27/15   0403  07/26/15   0359   WBC  9.8  10.6   HGB  11.1*  12.2   HCT  33.8*  39.4   PLT  185  225     Recent Labs      07/27/15   0403  07/26/15   2220  07/26/15   0359   NA  138  137  141   K  4.0  3.9  3.5   CL  98  97  101   CO2  30  30  34*   GLU  266*  390*  179*   BUN  30*  29*  22*   CREA  1.09*  1.35*  1.00   CA  8.0*  8.1*  8.0*   MG   --   1.9   --    PHOS   --   2.2*   --    ALB   --    --   3.4*   TBILI   --    --   0.5   SGOT   --    --   14*   ALT   --    --   11*   INR   --    --   1.0       Signed: Lynnea Ferrier, MD

## 2015-07-27 NOTE — Progress Notes (Addendum)
Acute on chronic COPD  Chronic home O2.   COPD  Morbid Obesity  Lymphedema  Mild aortic stenosis by echo.   Diabetes with hypoglycemia.       Normal LV fx by echo, no hx of heart disease , no hx of heart failure.        Don't really see evidence of CHF     Needs a good PA /lateral CXR.      Would continue on meds.

## 2015-07-27 NOTE — Consults (Addendum)
Palliative Medicine Consult  [place]: [phone])    Patient Name: Brittney Shelton  Date of Birth: 1950/11/15    Date of Initial Consult: 07/27/2015  Reason for Consult: care decisions  Requesting Provider: Dr. Jeani Sow. [place] Comment  Primary Care Physician: Ermalene Postin, MD      SUMMARY:   Brittney Shelton is a 65 y.o. woman with a past history of HTN, CHF, COPD on 4L O2 BNC, DM, h/o recent admission at [facility] in 05/2015 for R hand cellulitis, lymphedema, who was admitted on 07/26/2015 from home for acute on chronic respiratory failure and hypoglycemia.      Current medical issues leading to Palliative Medicine involvement include: care decisions     PALLIATIVE DIAGNOSES:   1. Dyspnea  2. Acute on chronic respiratory failure  3. O2 dependence  4. Counseling regarding goals of care and advance care planning     PLAN:   Brief Summary of Plan  -visited w/ pt in her rm.  No family at bedside  -[place] and ACP confirmed  -we will continue to establish therapeutic alliance w/ pt as well as provide psychosocial support    1. Goals of Care- confirmed  Pt wishes for full, restorative treatment and all measures that support this w/ 1 exception:  NO to blood transfusions  She remains undecided about other blood products    To be clear, YES to intubation if respiratory distress or failure outside of an end-of-life event  YES to pressors  YES to CPAP or BiPAP    2. Shared Medical Decision Making- pt makes her own medical decisions.  She does voice that, regarding such things as considering blood transfusion in the future, she would discuss w/ her family.     3. Information Sharing-   Pt verbalized understanding of all the points made including the following:  -we are an interdisciplinary team serving as a bridge of communication and advocate on behalf of the patient and family when needed  -we are a support care service that, when needed, assists w/ sx mgmt  -though hospice is "under" palliative medicine, our service is not hospice  nor does a consult visit by our service mean that hospice is being or should be considered  -we are not here to make medical decisions, and our being consulted does not equate to a change in a patient's overall condition    We spoke about current medical conditions.  We discussed range of medical treatment and, separate from this, code status.     Pt wished to complete the top portion/page of the AMD electing MPOA.      She notes that her decision for NO to blood transfusions is not moral or religious.  Pt is not a Jehovah's Witness.  She remains undecided about other blood products such as platelets.  For this reason, it is specified as NO to blood transfusions for now.  Pt said that she would think about this more.      Questions and concerns addressed.  Supportive listening provided.      Pt seemed amenable to having these conversations.  She said that no one had ever spoken about these things with her including code status.     4. Advance Care Planning- pt's code status is now DNAR/AND.  However, EMR order is for PARTIAL.    The reason for this is per above in #1.   Pink sheet completed.  Note that in section B, "Limited Medical Interventions" has been checked b/c of  NO to blood transfusions.  Placed on paper chart.    Pt did not have an AMD.  AMD was completed w/ pt today.  Witnessed by [facility] and me.  Original placed on paper chart.  3 copies given to pt.     5. Psychosocial Support- We will continue to support patient and family during this difficult hospitalization    6. Spiritual- at this time, there are no apparent spiritual needs or concerns.     7. Symptom Management- at this time, there does not appear to be symptom management for which our assistance is needed.     I have discussed with patient???s bedside RN, Elisha Headland.  We appreciate her report and input.    I have discussed with our palliative care IDT.     Thank you for this consult that has provided [place] with the opportunity to be  involved in this patient's care.  We will continue to follow along with you.  Should you have any questions or concerns prior to our next visit at the bedside, please do not hesitate to contact us at [phone]).    Heath Lark, MD  Palliative Care Team     GOALS OF CARE / TREATMENT PREFERENCES:   [====Goals of Care====]  GOALS OF CARE:  Patient / health care proxy stated goals:  Pt wishes for full, restorative treatment and all measures that support this w/ 1 exception:  NO to blood transfusions  She remains undecided about other blood products    To be clear, YES to intubation if respiratory distress or failure outside of an end-of-life event  YES to pressors  YES to CPAP or BiPAP    TREATMENT PREFERENCES:   Code Status: Partial Code  See above    Advance Care Planning:  Advance Care Planning 07/26/2015   Patient's Healthcare Decision Maker is: Named in scanned ACP document   Primary Decision Maker Name Brittney Shelton   Primary Decision Maker Phone Number [phone]   Primary Decision Maker Relationship to Patient Other relative   Secondary Decision Maker Name Brittney Shelton and Brittney Shelton    Secondary Decision Maker Relationship to Patient Sibling   Confirm Advance Directive Yes, on file   Patient Would Like to Complete Advance Directive Yes       Other:    The palliative care team has discussed with patient / health care proxy about goals of care / treatment preferences for patient.  [====Goals of Care====]         HISTORY:     History obtained from: pt, chart    CHIEF COMPLAINT: none at this time    HPI/SUBJECTIVE:    The patient is:    Verbal and participatory   Non-participatory due to:     Pt is a 65 y/o AAF who has extensive PMH who was her usual self until the AM of day prior to admission.  She checked her bld sugar, and it was 68.  Pt ate breakfast and took her meds per her usual routine.  She went to an eye doctor appt.  She came back home and recalls feeling ok until the  evening.  According to admission H&P, "she was talking out of her head".  One of her brothers w/ whom she lives called [phone].  Pt was brought to our ED via EMS.  Pt's FSG was 29 and O2 sats were in 80s on 4L per EMS report.  Pt arrived in our ED on NRB.  Clinical Pain Assessment (nonverbal scale for severity on nonverbal patients):   [++++ Clinical Pain Assessment++++]  [++++Pain Severity++++]: Pain: 0  [++++Pain Character++++]:   [++++Pain Duration++++]:   [++++Pain Effect++++]:   [++++Pain Factors++++]:   [++++Pain Frequency++++]:   [++++Pain Location++++]:   [++++ Clinical Pain Assessment++++]     FUNCTIONAL ASSESSMENT:     Palliative Performance Scale (PPS):  PPS: 70       PSYCHOSOCIAL/SPIRITUAL SCREENING:     Advance Care Planning:  Advance Care Planning 07/26/2015   Patient's Healthcare Decision Maker is: Named in scanned ACP document   Primary Decision Maker Name Brittney Shelton   Primary Decision Maker Phone Number [phone]   Primary Decision Maker Relationship to Patient Other relative   Secondary Decision Maker Name Brittney Shelton and Brittney Shelton    Secondary Decision Maker Relationship to Patient Sibling   Confirm Advance Directive Yes, on file   Patient Would Like to Complete Advance Directive Yes        Any spiritual / religious concerns:   Yes /   No    Caregiver Burnout:   Yes /   No /   No Caregiver Present      Anticipatory grief assessment:    Normal  /  Maladaptive       ESAS Anxiety: Anxiety: 0    ESAS Depression: Depression: 0        REVIEW OF SYSTEMS:     Positive and pertinent negative findings in ROS are noted above in HPI.  The following systems were  reviewed /  unable to be reviewed as noted in HPI  Other findings are noted below.  Systems: constitutional, ears/nose/mouth/throat, respiratory, gastrointestinal, genitourinary, musculoskeletal, integumentary, neurologic, psychiatric, endocrine. Positive findings noted below.  Modified ESAS Completed by: provider    Fatigue: 1 Drowsiness: 0   Depression: 0 Pain: 0   Anxiety: 0 Nausea: 0   Anorexia: 0 Dyspnea: 0                    PHYSICAL EXAM:     From RN flowsheet:  Wt Readings from Last 3 Encounters:   07/27/15 310 lb 13.6 oz (141 kg)   06/19/15 307 lb 12.8 oz (139.6 kg)   03/15/15 302 lb (137 kg)     Blood pressure 156/71, pulse 69, temperature 97.4 ??F (36.3 ??C), resp. rate 18, weight 310 lb 13.6 oz (141 kg), SpO2 92 %.    Pain Scale 1: Numeric (0 - 10)  Pain Intensity 1: 0                   Constitutional: obese, WD, in NAD  Was receiving breathing tx when I visited  HEENT: NC/AT, MMM  Respiratory: breathing not labored, symmetric  Neurologic: awake, alert, following commands, moving all extremities  Psychiatric: full affect, no hallucinations  No obvious signs of anxiety or agitation  Demonstrates capacity per Applebaum criteria regarding current medical tx, range of medical tx, code status, and consideration of an AMD     HISTORY:     Active Problems:    Hypertension, uncontrolled (09/29/2014)      Hypercholesterolemia (09/29/2014)      Respiratory failure ([place]) (07/26/2015)      Hypoglycemia (07/26/2015)      COPD (chronic obstructive pulmonary disease) ([place]) (07/26/2015)      Obesity (07/27/2015)      Type 2 diabetes mellitus with hypoglycemia ([place]) (07/27/2015)      Acute infective exacerbation of  chronic obstructive airway disease ([place]) (07/27/2015)      Requires continuous at home supplemental oxygen (07/27/2015)      Dependence on supplemental oxygen (07/27/2015)      Past Medical History:   Diagnosis Date   ??? Chronic obstructive pulmonary disease ([place])    ??? Congestive heart failure ([place])    ??? Diabetes ([place])    ??? Hypertension       No past surgical history on file.   Family History   Problem Relation Age of Onset   ??? Diabetes Mother    ??? Arthritis-osteo Father    ??? COPD Sister    ??? Diabetes Brother    ??? Diabetes Brother       History reviewed, no pertinent family history.  Social History   Substance Use Topics    ??? Smoking status: Never Smoker   ??? Smokeless tobacco: Never Used   ??? Alcohol use No     No Known Allergies   Current Facility-Administered Medications   Medication Dose Route Frequency   ??? [START ON 07/28/2015] predniSONE (DELTASONE) tablet 40 mg  40 mg Oral DAILY WITH BREAKFAST   ??? amLODIPine (NORVASC) tablet 10 mg  10 mg Oral DAILY   ??? aspirin chewable tablet 81 mg  81 mg Oral DAILY   ??? calcium carbonate (OS-CAL) tablet 500 mg [elemental]  500 mg Oral DAILY   ??? cloNIDine HCl (CATAPRES) tablet 0.1 mg  0.1 mg Oral DAILY   ??? isosorbide dinitrate (ISORDIL) tablet 30 mg  30 mg Oral TID   ??? metoprolol succinate (TOPROL-XL) XL tablet 100 mg  100 mg Oral DAILY   ??? pravastatin (PRAVACHOL) tablet 20 mg  20 mg Oral QHS   ??? sodium chloride (NS) flush 5-10 mL  5-10 mL IntraVENous Q8H   ??? sodium chloride (NS) flush 5-10 mL  5-10 mL IntraVENous PRN   ??? acetaminophen (TYLENOL) tablet 650 mg  650 mg Oral Q4H PRN   ??? ondansetron (ZOFRAN) injection 4 mg  4 mg IntraVENous Q4H PRN   ??? enoxaparin (LOVENOX) injection 40 mg  40 mg SubCUTAneous Q12H   ??? hydrALAZINE (APRESOLINE) 20 mg/mL injection 10 mg  10 mg IntraVENous Q6H PRN   ??? furosemide (LASIX) injection 40 mg  40 mg IntraVENous Q12H   ??? albuterol-ipratropium (DUO-NEB) 2.5 MG-0.5 MG/3 ML  3 mL Nebulization Q6H RT   ??? insulin lispro (HUMALOG) injection   SubCUTAneous AC&HS   ??? glucose chewable tablet 16 g  4 Tab Oral PRN   ??? dextrose (D50W) injection syrg 12.5-25 g  12.5-25 g IntraVENous PRN   ??? glucagon (GLUCAGEN) injection 1 mg  1 mg IntraMUSCular PRN          LAB AND IMAGING FINDINGS:     Lab Results   Component Value Date/Time    WBC 9.8 07/27/2015 04:03 AM    HGB 11.1 07/27/2015 04:03 AM    PLATELET 185 07/27/2015 04:03 AM     Lab Results   Component Value Date/Time    Sodium 138 07/27/2015 04:03 AM    Potassium 4.0 07/27/2015 04:03 AM    Chloride 98 07/27/2015 04:03 AM    CO2 30 07/27/2015 04:03 AM    BUN 30 07/27/2015 04:03 AM    Creatinine 1.09 07/27/2015 04:03 AM     Calcium 8.0 07/27/2015 04:03 AM    Magnesium 1.9 07/26/2015 10:20 PM    Phosphorus 2.2 07/26/2015 10:20 PM      Lab Results   Component Value  Date/Time    AST (SGOT) 14 07/26/2015 03:59 AM    Alk. phosphatase 70 07/26/2015 03:59 AM    Protein, total 8.0 07/26/2015 03:59 AM    Albumin 3.4 07/26/2015 03:59 AM    Globulin 4.6 07/26/2015 03:59 AM     Lab Results   Component Value Date/Time    INR 1.0 07/26/2015 03:59 AM    Prothrombin time 10.2 07/26/2015 03:59 AM    aPTT 34.7 07/26/2015 03:59 AM      No results found for: IRON, FE, TIBC, IBCT, PSAT, FERR   Lab Results   Component Value Date/Time    pH 7.34 07/26/2015 03:57 AM    PCO2 57 07/26/2015 03:57 AM    PO2 83 07/26/2015 03:57 AM     No components found for: [facility]   Lab Results   Component Value Date/Time    CK 75 07/26/2015 03:59 AM    CK - MB <1.0 07/26/2015 03:59 AM                Total time: 70 min  Counseling / coordination time: 40 min  > 50% counseling / coordination?: yes    Prolonged service was provided for  30 min   75 min in face to face time in the presence of the patient.  Time Start:   Time End:   Note: this can only be billed with [phone] (initial) or [phone] (follow up).  If multiple start / stop times, list each separately.

## 2015-07-27 NOTE — Progress Notes (Signed)
Problem: Chronic Obstructive Pulmonary Disease (COPD)  Goal: *Absence of hypoxia  Outcome: Progressing Towards Goal  O2 sats 90-92 on 4L O2.  Becomes dyspneic when getting up to [facility].  Continue to encourage increases in ambulation as much as pt can tolerate.

## 2015-07-27 NOTE — Progress Notes (Signed)
Heart Failure Care Coordinator visited the patient in room.??Provided introduction to self, and explanation of the Care Coordinator role.  Patient was provided a copy of the [facility] letter. Patient made aware of contact information should any questions arise before or after discharge.     Patient stated they did not have have functioning scales. Scales delivered to room by HF care coordinator. Reinforced the importance of checking weight every AM and calling MD if weight is up 3 lbs in a day or 5 lbs in a week (or per MD guidelines).

## 2015-07-28 LAB — GLUCOSE, POC
Glucose (POC): 288 mg/dL — ABNORMAL HIGH (ref 65–100)
Glucose (POC): 100 mg/dL (ref 65–100)
Glucose (POC): 261 mg/dL — ABNORMAL HIGH (ref 65–100)
Glucose (POC): 259 mg/dL — ABNORMAL HIGH (ref 65–100)

## 2015-07-28 LAB — METABOLIC PANEL, BASIC
Anion gap: 11 mmol/L (ref 5–15)
BUN/Creatinine ratio: 44 — ABNORMAL HIGH (ref 12–20)
BUN: 40 MG/DL — ABNORMAL HIGH (ref 6–20)
CO2: 31 mmol/L (ref 21–32)
Calcium: 8 MG/DL — ABNORMAL LOW (ref 8.5–10.1)
Chloride: 98 mmol/L (ref 97–108)
Creatinine: 0.91 MG/DL (ref 0.55–1.02)
GFR est AA: >60 mL/min/{1.73_m2} (ref >60)
GFR est non-AA: >60 mL/min/{1.73_m2} (ref >60)
Glucose: 129 mg/dL — ABNORMAL HIGH (ref 65–100)
Potassium: 3.6 mmol/L (ref 3.5–5.1)
Sodium: 140 mmol/L (ref 136–145)

## 2015-07-28 LAB — MAGNESIUM: Magnesium: 2.1 mg/dL (ref 1.6–2.4)

## 2015-07-28 MED ORDER — PREDNISONE 20 MG TAB
20 mg | ORAL_TABLET | Freq: Every day | ORAL | 0 refills | Status: DC
Start: 2015-07-28 — End: 2015-10-10

## 2015-07-28 MED ORDER — GLIMEPIRIDE 1 MG TAB
1 mg | Freq: Every day | ORAL | Status: DC
Start: 2015-07-28 — End: 2015-07-29
  Administered 2015-07-28 – 2015-07-29 (×2): via ORAL

## 2015-07-28 MED ORDER — ALBUTEROL SULFATE HFA 90 MCG/ACTUATION AEROSOL INHALER
90 mcg/actuation | Freq: Four times a day (QID) | RESPIRATORY_TRACT | 0 refills | Status: DC | PRN
Start: 2015-07-28 — End: 2016-12-17

## 2015-07-28 MED ORDER — FUROSEMIDE 40 MG TAB
40 mg | Freq: Two times a day (BID) | ORAL | Status: DC
Start: 2015-07-28 — End: 2015-07-29
  Administered 2015-07-28 – 2015-07-29 (×2): via ORAL

## 2015-07-28 MED FILL — CALCIUM CARBONATE 500 MG (1250 MG) TAB: 500 mg calcium (1,250 mg) | ORAL | Qty: 1

## 2015-07-28 MED FILL — IPRATROPIUM-ALBUTEROL 2.5 MG-0.5 MG/3 ML NEB SOLUTION: 2.5 mg-0.5 mg/3 ml | RESPIRATORY_TRACT | Qty: 3

## 2015-07-28 MED FILL — INSULIN LISPRO 100 UNIT/ML INJECTION: 100 unit/mL | SUBCUTANEOUS | Qty: 1

## 2015-07-28 MED FILL — LOVENOX 40 MG/0.4 ML SUBCUTANEOUS SYRINGE: 40 mg/0.4 mL | SUBCUTANEOUS | Qty: 0.4

## 2015-07-28 MED FILL — BD POSIFLUSH NORMAL SALINE 0.9 % INJECTION SYRINGE: INTRAMUSCULAR | Qty: 10

## 2015-07-28 MED FILL — CLONIDINE 0.1 MG TAB: 0.1 mg | ORAL | Qty: 1

## 2015-07-28 MED FILL — PRAVASTATIN 10 MG TAB: 10 mg | ORAL | Qty: 2

## 2015-07-28 MED FILL — CHILDREN'S ASPIRIN 81 MG CHEWABLE TABLET: 81 mg | ORAL | Qty: 1

## 2015-07-28 MED FILL — ISOSORBIDE DINITRATE 20 MG TAB: 20 mg | ORAL | Qty: 2

## 2015-07-28 MED FILL — PREDNISONE 20 MG TAB: 20 mg | ORAL | Qty: 2

## 2015-07-28 MED FILL — FUROSEMIDE 10 MG/ML IJ SOLN: 10 mg/mL | INTRAMUSCULAR | Qty: 4

## 2015-07-28 MED FILL — AMLODIPINE 5 MG TAB: 5 mg | ORAL | Qty: 2

## 2015-07-28 MED FILL — GLIMEPIRIDE 1 MG TAB: 1 mg | ORAL | Qty: 4

## 2015-07-28 MED FILL — TOPROL XL 50 MG TABLET,EXTENDED RELEASE: 50 mg | ORAL | Qty: 2

## 2015-07-28 MED FILL — FUROSEMIDE 40 MG TAB: 40 mg | ORAL | Qty: 1

## 2015-07-28 NOTE — Progress Notes (Addendum)
1935 Shift change report received from off going nurse.  Report given with Kardex, Intake/Output, MAR and Recent Results.     Blood pressure 155/65, pulse 73, temperature 97.7 ??F (36.5 ??C), resp. rate 18, weight 138.7 kg (305 lb 12.5 oz), SpO2 96 %.    Patient up to the bathroom.    2015 Patient request HS meds so that she can go to sleep now.    2308 Shift change report given to on coming nurse.  Report given with Kardex, Intake/Output, MAR and Recent Results.       Blood pressure 154/68, pulse 77, temperature 98.2 ??F (36.8 ??C), resp. rate 20, weight 138.7 kg (305 lb 12.5 oz), SpO2 98 %.    The patient is sleeping at this time.

## 2015-07-28 NOTE — Progress Notes (Signed)
Problem: Mobility Impaired (Adult and Pediatric)  Goal: *Acute Goals and Plan of Care (Insert Text)  Physical Therapy Goals  Initiated 07/28/2015  1. Patient will move from supine to sit and sit to supine , scoot up and down and roll side to side in bed with independence within 7 day(s).   2. Patient will transfer from bed to chair and chair to bed with independence using the least restrictive device within 7 day(s).  3. Patient will perform sit to stand with independence within 7 day(s).  4. Patient will ambulate with modified independence for 250 feet with the least restrictive device within 7 day(s).   5. Patient will ascend/descend 4 stairs with single handrail(s) with modified independence within 7 day(s).  PHYSICAL THERAPY EVALUATION  Patient: Brittney Shelton (65 y.o. female)  Date: 07/28/2015  Primary Diagnosis: Respiratory failure (HCC)        Precautions:  Other (comment) (partial code)      ASSESSMENT :  Based on the objective data described below, the patient presents with activity intolerance, baseline L knee DJD, obesity, reliance on baseline 4L NCO2 that impacts functional mobility. Patient mod-I PTA with use of SPC for gait, on 4L NCO2 at all times at home and largely sedentary within household while living with family. Received at bedside chair on 4L NCO2 and agreeable; author obtained bariatric SPC to utilize as detailed below throughout gait where noted L antalgic stance phase, trunk sway 2/2 such and body habitus, with frequent standing breaks with dual tasking. Patient demonstrates fairly effective PLB technique with cueing for longer exhalation, forced coughing technique unable to maintain O2 even with 6L NCO2 supplemental and rest periods. Patient dropped to 79% briefly upon sit and conclusion of activity on 6L but within 30 seconds of focused breathing improved to >95% with O2 bumped down to 4L at conclusion. Patient near functional baseline; will f/u ahead to maintain current  abilities and ascertain functioning throughout respiratory concerns.      Patient will benefit from skilled intervention to address the above impairments.  Patient???s rehabilitation potential is considered to be Good  Factors which may influence rehabilitation potential include:   [ ]          None noted  [ ]          Mental ability/status  [X]          Medical condition  [ ]          Home/family situation and support systems  [ ]          Safety awareness  [ ]          Pain tolerance/management  [ ]          Other:        PLAN :  Recommendations and Planned Interventions:  [X]            Bed Mobility Training             [ ]     Neuromuscular Re-Education  [X]            Transfer Training                   [ ]     Orthotic/Prosthetic Training  [X]            Gait Training                         [ ]     Modalities  [X]   Therapeutic Exercises               Edema Management/Control             Therapeutic Activities                Patient and Family Training/Education             Other (comment):     Frequency/Duration: Patient will be followed by physical therapy  3 times a week to address goals.  Discharge Recommendations: likely none - return to home  Further Equipment Recommendations for Discharge: has portable O2 backpack, concentrator, portable pulse oximeter, SPC        SUBJECTIVE:   Patient stated ???I got one for christmas.??? referring to pulse oximeter.       OBJECTIVE DATA SUMMARY:   HISTORY:    Past Medical History:   Diagnosis Date   ??? Chronic obstructive pulmonary disease (HCC)     ??? Congestive heart failure (HCC)     ??? Diabetes (HCC)     ??? Hypertension     No past surgical history on file.  Prior Level of Function/Home Situation: Mod-I with use of SPC in R hand, largely within household going out PRN for MD visits or groceries. Lives with brother. Uses concentrator, and has backpack O2 when she goes out. Denies falls. Denies furniture walking. On 4L constantly NCO2.    Personal factors and/or comorbidities impacting plan of care:      Home Situation  Home Environment: Private residence  # Steps to Enter: 3  Rails to Enter: Yes  Hand Rails : Left  Wheelchair Ramp: No  One/Two Story Residence: One story  Living Alone: No  Support Systems: (P) Family member(s) (brother)  Patient Expects to be Discharged to:: Private residence  Current DME Used/Available at Home: None  Tub or Shower Type: Tub/Shower combination     EXAMINATION/PRESENTATION/DECISION MAKING:   Critical Behavior:  Neurologic State: Alert  Orientation Level: Oriented X4  Cognition: Appropriate decision making  Safety/Judgement: Awareness of environment  Strength:    Strength: Generally decreased, functional                    Tone & Sensation:   Tone: Normal              Sensation: Intact               Range Of Motion:  AROM: Generally decreased, functional                       Coordination:  Coordination: Within functional limits     Functional Mobility:  Bed Mobility:  Rolling:  (received at bedside chair)           Transfers:  Sit to Stand: Supervision  Stand to Sit: Supervision                       Balance:   Sitting: Intact;Without support  Standing: Impaired;With support (of SPC in R )  Standing - Static: Good  Standing - Dynamic : Good (but reaching with LUE at times)  Ambulation/Gait Training:  Distance (ft): 75 Feet (ft)  Assistive Device: Gait belt;Cane, straight (bariatric cane)  Ambulation - Level of Assistance: Supervision     Gait Description (WDL): Exceptions to WDL  Gait Abnormalities: Trunk sway increased;Antalgic        Base of Support: Widened     Speed/Cadence:  Pace decreased (<100 feet/min);Fluctuations (stops with conversing)  Step Length: Left shortened;Right shortened                 Functional Measure:  Barthel Index:      Bathing: 0  Bladder: 10  Bowels: 10  Grooming: 5  Dressing: 5  Feeding: 10  Mobility: 0  Stairs: 0  Toilet Use: 5  Transfer (Bed to Chair and Back): 10  Total: 55          Barthel and G-code impairment scale:  Percentage of impairment CH  0% CI  1-19% CJ  20-39% CK  40-59% CL  60-79% CM  80-99% CN  100%   Barthel Score 0-100 100 99-80 79-60 59-40 20-39 1-19    0   Barthel Score 0-20 20 17-19 13-16 9-12 5-8 1-4 0      The Barthel ADL Index: Guidelines  1. The index should be used as a record of what a patient does, not as a record of what a patient could do.  2. The main aim is to establish degree of independence from any help, physical or verbal, however minor and for whatever reason.  3. The need for supervision renders the patient not independent.  4. A patient's performance should be established using the best available evidence. Asking the patient, friends/relatives and nurses are the usual sources, but direct observation and common sense are also important. However direct testing is not needed.  5. Usually the patient's performance over the preceding 24-48 hours is important, but occasionally longer periods will be relevant.  6. Middle categories imply that the patient supplies over 50 per cent of the effort.  7. Use of aids to be independent is allowed.     Clarisa Kindred., Barthel, D.W. [phone]). Functional evaluation: the Barthel Index. Md State Med J (14)2.  Zenaida Niece der [place], J.J.M.F, [place]ill, Ian Malkin., Margret Chance., [place], [place]. (1999). Measuring the change indisability after inpatient rehabilitation; comparison of the responsiveness of the Barthel Index and Functional Independence Measure. Journal of Neurology, Neurosurgery, and Psychiatry, 66(4), [phone].  Dawson Bills, N.J.A, [place],  W.J.M, & Koopmanschap, M.A. (2004.) Assessment of post-stroke quality of life in cost-effectiveness studies: The usefulness of the Barthel Index and the EuroQoL-5D. Quality of Life Research, 13, 098-11            G codes:  In compliance with CMS???s Claims Based Outcome Reporting, the following G-code set was chosen for this patient based on their primary functional limitation being treated:      The outcome measure chosen to determine the severity of the functional limitation was the Barthel with a score of 55/100 which was correlated with the impairment scale.      ?? Mobility - Walking and Moving Around:              B1478 - CURRENT STATUS:    CK - 40%-59% impaired, limited or restricted              G9562 - GOAL STATUS:           CI - 1%-19% impaired, limited or restricted              Z3086 - D/C STATUS:                       ---------------To be determined---------------         Pain:  Pain Scale 1: Numeric (0 - 10)  Pain Intensity 1: 0  Activity Tolerance:   79% at lowest on 6L immediately post activity. Within 30 seconds, back on 4L and >90%.      Please refer to the flowsheet for vital signs taken during this treatment.  After treatment:   [X]          Patient left in no apparent distress sitting up in chair  [ ]          Patient left in no apparent distress in bed  [X]          Call bell left within reach  [X]          Nursing notified  [ ]          Caregiver present  [ ]          Bed alarm activated      COMMUNICATION/EDUCATION:   The patient???s plan of care was discussed with: Registered Nurse.  [X]          Fall prevention education was provided and the patient/caregiver indicated understanding.  [X]          Patient/family have participated as able in goal setting and plan of care.  [X]          Patient/family agree to work toward stated goals and plan of care.  [ ]          Patient understands intent and goals of therapy, but is neutral about his/her participation.  [ ]          Patient is unable to participate in goal setting and plan of care.     Thank you for this referral.  Horton Chin, PT, DPT    Time Calculation: 20 mins

## 2015-07-28 NOTE — Progress Notes (Signed)
Problem: Falls - Risk of  Goal: *Absence of falls  Outcome: Progressing Towards Goal  No Falls this shift.

## 2015-07-28 NOTE — Other (Addendum)
*   No surgery found *  Bedside and Verbal shift change report given to Heidi  RN (oncoming nurse) by Randolph Bing (offgoing nurse). Report included the following information SBAR, Kardex, MAR, and Recent Results  ????  Zone Phone: [phone]  ????  ????  Significant changes during shift:  BS 100 weight 138.7kg????  ????  Patient Information  ????  Brittney Shelton  65 y.o.  07/26/2015 3:08 AM by Nestor Lewandowsky, MD. [name] was admitted from Home  ????  Problem List  ????  ???? ???? ????   Patient Active Problem List   ???? Diagnosis Date Noted   ??? Respiratory failure (HCC) 07/26/2015   ??? Hypoglycemia 07/26/2015   ??? COPD (chronic obstructive pulmonary disease) (HCC) 07/26/2015   ??? Hypertension, uncontrolled 09/29/2014   ??? Well controlled type 2 diabetes mellitus (HCC) 09/29/2014   ??? Hypercholesterolemia 09/29/2014   ??? 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 Chronic acquired lymphedema 09/29/2014   ????  ???? ???? ????   Past Medical History:   Diagnosis Date   ??? Chronic obstructive pulmonary disease (HCC) ????   ??? Congestive heart failure (HCC) ????   ??? Diabetes (HCC) ????   ??? Hypertension ????   ????  ????  ????  Core Measures:  ????  CVA: No No  CHF:yes yes  PNA:No No  ????  Post Op Surgical (If Applicable):   ????  n/a  ????  Activity Status:  ????  OOB to Chair YES  Ambulated this shift: YES  Bed Rest No  ????  Supplemental O2: (If Applicable)  ????  NC Yes  NRB No  Venti-mask No  On 4 Liters/min  ????  ????  LINES AND DRAINS:  ????  22 in the left hand, saline locked  ????  DVT prophylaxis:  ????  DVT prophylaxis Med- Yes, lovenox  DVT prophylaxis SCD or TED- No   ????  Wounds: (If Applicable)  ????  Wounds- No  ????  Location -  ????  Patient Safety:  ????  Falls Score Total Score: 4  Safety Level_______  Bed Alarm On? Yes  Sitter? No  ????  Plan for upcoming shift: nebs, monitor respiratory effort, diuretics  ????  ????  ????  Discharge Plan: Yes when stable  ????  Active Consults:  None  ????  ????  ??   ??      ?? ??   ??

## 2015-07-28 NOTE — Discharge Summary (Signed)
Discharge Summary  Name: Brittney Shelton  [id number]  Date of Birth: 20-Apr-1951 (Age: 65 y.o.)   Date of Admission: 07/26/2015  Date of Discharge: 07/29/2015  Attending Physician: Lynnea Ferrier, MD    Discharge Diagnosis:   Acute on chronic respiratory faiure  Acute diastolic heart failure/COPD/HTN  Acute kidney injury  Hypoglycemia  Morbid obesity    Consultants: Cardiology    Procedures: none    Brief Admission History/Reason for Admission Per Brittney Lewandowsky, MD:   Brittney Shelton is a 65 y.o. female who lives at home with brother, with multiple comorbs qas admitted in [facility] in dec 2016 for right hand cellulitis, and is been following with pcp on lasix for lymphadema at home  And hypertension multiple meds Copd on 4 litres, does not remember her pulmonary doctor name Was doing ok until yesterday morning, when her sugar was 68, ate food and took her meds, went to the eye doctor for the appointment Came home, was feeling ok and in the evening, she was talking out of her head, when her brother called the EMs BS was 29 and hypoxic at 80 on 4 litres. Brought here  On NRB, pt denies any chest pain or badominal pain .    Brief Hospital Course by Main Problems:   Acute on chronic respiratory failure ( baseline 4L)  Acute diastolic heart failure/COPD/HTN  Echo with nl EF, grade I diastolic HF.  Patient was given IV lasix, transitioned to PO.  Home meds resumed.    Pt was also treated with COPD exacerbation, improved with nebs and steroids.  She remained HD stable, back on 4LNC.  Stable for discharge with HH/OT.  ??  Acute kidney injury, resolved, likely due to volume overload .   ??  Hypoglycemia in a patient with diabetes causing encephalopathy  UA neg, and chest xray neg.  Resolved.  Likely secondary to poor po intake in the setting of AKI.     Lymphedema  Lactic acidosis, resolved  Morbid obesity  ????      Discharge Exam:  Patient seen and examined by me on discharge day.  Pertinent Findings:  Visit Vitals   ??? BP 148/74    ??? Pulse 66   ??? Temp 98 ??F (36.7 ??C)   ??? Resp 18   ??? Wt 138.8 kg (306 lb)   ??? SpO2 96%   ??? BMI 49.39 kg/m2     Gen:    Not in distress  Chest: Clear lungs  CVS:   Regular rhythm.  No edema  Abd:  Soft, not distended, not tender    Discharge/Recent Laboratory Results:  Recent Labs      07/28/15   0324   07/26/15   2220   NA  140   < >  137   K  3.6   < >  3.9   CL  98   < >  97   CO2  31   < >  30   BUN  40*   < >  29*   GLU  129*   < >  390*   CA  8.0*   < >  8.1*   PHOS   --    --   2.2*   MG  2.1   --   1.9    < > = values in this interval not displayed.     Recent Labs      07/27/15   0403   HGB  11.1*  HCT  33.8*   WBC  9.8   PLT  185       Discharge Medications:  Current Discharge Medication List      START taking these medications    Details   predniSONE (DELTASONE) 20 mg tablet Take 2 Tabs by mouth daily.  Qty: 3 Tab, Refills: 0      albuterol (PROVENTIL HFA, VENTOLIN HFA, PROAIR HFA) 90 mcg/actuation inhaler Take 1 Puff by inhalation every six (6) hours as needed for Wheezing.  Qty: 1 Inhaler, Refills: 0         CONTINUE these medications which have NOT CHANGED    Details   furosemide (LASIX) 40 mg tablet Take 1 tablet two times a day.  Indications: Edema  Qty: 180 Tab, Refills: 1    Associated Diagnoses: Chronic acquired lymphedema      amLODIPine (NORVASC) 10 mg tablet Take 1 Tab by mouth daily.  Qty: 90 Tab, Refills: 1    Associated Diagnoses: Hypertension, uncontrolled      metoprolol succinate (TOPROL-XL) 200 mg XL tablet TAKE 1 TABLET BY MOUTH DAILY FOR HIGH BLOOD PRESSURE  Qty: 90 Tab, Refills: 5    Comments: **Patient requests 90 days supply**  Associated Diagnoses: Hypertension, uncontrolled      metFORMIN (GLUCOPHAGE) 1,000 mg tablet TAKE 1 TABLET BY MOUTH TWICE DAILY WITH MEALS  Qty: 180 Tab, Refills: 5    Comments: **Patient requests 90 days supply**      lisinopril (PRINIVIL, ZESTRIL) 40 mg tablet TAKE 1 TABLET BY MOUTH ONCE DAILY  Qty: 90 Tab, Refills: 5     Comments: **Patient requests 90 days supply**  Associated Diagnoses: Hypertension, uncontrolled      isosorbide dinitrate (ISORDIL) 30 mg tablet TAKE 1 TABLET BY MOUTH THREE TIMES DAILY  Qty: 270 Tab, Refills: 5    Comments: **Patient requests 90 days supply**  Associated Diagnoses: Hypertension, uncontrolled      glimepiride (AMARYL) 4 mg tablet TAKE 1 TABLET BY MOUTH ONCE EVERY MORNING  Qty: 90 Tab, Refills: 5    Comments: **Patient requests 90 days supply**  Associated Diagnoses: Well controlled type 2 diabetes mellitus (HCC)      cloNIDine HCl (CATAPRES) 0.1 mg tablet TAKE 1 TABLET BY MOUTH ONCE DAILY  Qty: 90 Tab, Refills: 5    Comments: **Patient requests 90 days supply**  Associated Diagnoses: Hypertension, uncontrolled      pravastatin (PRAVACHOL) 20 mg tablet Take 1 Tab by mouth nightly.  Qty: 30 Tab, Refills: 5    Associated Diagnoses: Hypercholesterolemia      aspirin 81 mg chewable tablet Take 81 mg by mouth daily.      calcium carbonate (OS-CAL) 500 mg calcium (1,250 mg) tablet Take  by mouth daily.      Oxygen              DISPOSITION:    Home with Family:    Home with HH/PT/OT/RN: x   SNF/LTC:    [facility]:    OTHER:          Follow up with:   PCP : Erenest Rasher, MD  Follow-up Information     Follow up With Details Comments Contact Info    Erenest Rasher, MD In 3 days  [address]  [address]  [place] [address]  [phone]            Diet: cardiac    Total time in minutes spent coordinating this discharge (includes going over instructions, follow-up, prescriptions,  and preparing report for sign off to her PCP) :  35 minutes

## 2015-07-28 NOTE — Other (Signed)
C/P Rehab. Note:    Patient showed up on CHF bundle list.  Chart reviewed:  65 y.o. Female admitted 07/26/15 with acute on chronic respiratory failure ( On 4 litres baseline).  R/O CHF exacerbation and R/O COPD exacerbation.      Past Medical History:   Diagnosis Date   ??? Chronic obstructive pulmonary disease (HCC) ??   ??? Congestive heart failure ([facility]) ??   ??? Diabetes ([facility]) ??   ??? Hypertension ??      Non - smoker.  EF 60-65% on TEE of 07/26/15.      Dr. Daniel Nones saw in consult 07/27/15.  States no CHF at this time.    Met with patient who is sitting up in chair with oxygen in place.     Patient  given printed teaching materials on CHF and low NA diet. Instruction given on s/s of CHF, checking weight every am and calling MD if weight is up 2-3 lbs in a day or 5 lbs in a week, fluid/Na restrictions, s/s of worsening CHF and when to call MD. Reviewed activity as tolerated with frequent rest periods as needed, taking medications as prescribed, and the importance of follow up visits with physician.     Explained to patient that they do not feel CHF is issue this admission, but that this information would help prevent further episodes.  She does have scales and weighs at home.      Physical therapist in room getting ready to work with patient.  Left information for her to review.  Added information on low salt diet and "Salty Six".    Pt verbalized understanding and had no further questions at this time.

## 2015-07-28 NOTE — Progress Notes (Signed)
Hospitalist Progress Note    NAME: Aaryanna Hyden   DOB:  08-15-1950   MRN:  [id number]       Interim Hospital Summary: 65 y.o. female whom presented on 07/26/2015 with      Assessment / Plan:  Acute on chronic respiratory failure ( baseline 4L)  Acute diastolic heart failure  COPD  HTN  -Echo with nl EF, grade I diastolic HF  -PO lasix BID, isordil, toprol xl, norvasc, clonidine  -daily wt, I&O, monitor lytes   -nebs/PO steroids  -likely dc home in the AM    Acute kidney injury, resolved  -likely due to volume overload  -hold nephrotoxic agents including lisinopril    Hypoglycemia in a patient with diabetes   Causing encephalopathy, resolved  -hold metformin and glimepiride due to aki  -ua neg, and chest xray neg  -SSI    Lymphedema  Lactic acidosis, resolved  -does not look septic on admission    Morbid obesity  ??  ??  Code Status: full  Surrogate Decision Maker:  ??  DVT Prophylaxis: lovenox  GI Prophylaxis: not indicated  ??  Baseline:fair functional status     Subjective:     Chief Complaint / Reason for Physician Visit  No acute complaints. Discussed with RN events overnight.     Review of Systems:  Symptom Y/N Comments  Symptom Y/N Comments   Fever/Chills n   Chest Pain n    Poor Appetite n   Edema n    Cough n   Abdominal Pain n    Sputum n   Joint Pain n    SOB/DOE n   Pruritis/Rash n    Nausea/vomit    Tolerating PT/OT     Diarrhea    Tolerating Diet     Constipation    Other       Could NOT obtain due to:      Objective:     VITALS:   Last 24hrs VS reviewed since prior progress note. Most recent are:  Patient Vitals for the past 24 hrs:   Temp Pulse Resp BP SpO2   07/28/15 1214 - - - - 90 %   07/28/15 1105 97.5 ??F (36.4 ??C) 60 16 130/57 93 %   07/28/15 0838 - - - - 90 %   07/28/15 0725 98.1 ??F (36.7 ??C) 69 16 152/63 93 %   07/28/15 0400 97.5 ??F (36.4 ??C) 70 16 104/40 92 %   07/28/15 0213 - - - - 90 %   07/28/15 0000 97.5 ??F (36.4 ??C) 69 16 93/43 94 %   07/27/15 2137 - 68 - 143/68 -    07/27/15 1949 97.4 ??F (36.3 ??C) 69 18 156/71 92 %   07/27/15 1909 - - - - 95 %   07/27/15 1509 - - - - 91 %   07/27/15 1500 97.6 ??F (36.4 ??C) 72 18 160/58 96 %       Intake/Output Summary (Last 24 hours) at 07/28/15 1417  Last data filed at 07/28/15 0930   Gross per 24 hour   Intake              910 ml   Output              800 ml   Net              110 ml        PHYSICAL EXAM:  General: WD, WN. Alert, cooperative, no acute distress????  EENT:  EOMI. Anicteric sclerae. MMM  Resp:  Decrease air entry at lung base, mild crackles.  No accessory muscle use  CV:  Regular  rhythm,?? No edema  GI:  Soft, Non distended, Non tender. ??+Bowel sounds  Neurologic:?? Alert and oriented X 3, normal speech,   Psych:???? Good insight.??Not anxious nor agitated  Skin:  No rashes.  No jaundice    Reviewed most current lab test results and cultures  YES  Reviewed most current radiology test results   YES  Review and summation of old records today    NO  Reviewed patient's current orders and MAR    YES  PMH/SH reviewed - no change compared to H&P  ________________________________________________________________________  Care Plan discussed with:    Comments   Patient     Family      RN     Care Engineer, maintenance (IT)                        Multidiciplinary team rounds were held today with case manager, nursing, pharmacist and clinical coordinator.  Patient's plan of care was discussed; medications were reviewed and discharge planning was addressed.     ________________________________________________________________________  Total NON critical care TIME:  45   Minutes    Total CRITICAL CARE TIME Spent:   Minutes non procedure based      Comments   >50% of visit spent in counseling and coordination of care     ________________________________________________________________________  Lynnea Ferrier, MD     Procedures: see electronic medical records for all procedures/Xrays and details which were not copied into this note but were reviewed prior to  creation of Plan.      LABS:  I reviewed today's most current labs and imaging studies.  Pertinent labs include:  Recent Labs      07/27/15   0403  07/26/15   0359   WBC  9.8  10.6   HGB  11.1*  12.2   HCT  33.8*  39.4   PLT  185  225     Recent Labs      07/28/15   0324  07/27/15   0403  07/26/15   2220  07/26/15   0359   NA  140  138  137  141   K  3.6  4.0  3.9  3.5   CL  98  98  97  101   CO2  31  30  30   34*   GLU  129*  266*  390*  179*   BUN  40*  30*  29*  22*   CREA  0.91  1.09*  1.35*  1.00   CA  8.0*  8.0*  8.1*  8.0*   MG  2.1   --   1.9   --    PHOS   --    --   2.2*   --    ALB   --    --    --   3.4*   TBILI   --    --    --   0.5   SGOT   --    --    --   14*   ALT   --    --    --   11*   INR   --    --    --   1.0  Signed: Malon Kindle, MD

## 2015-07-28 NOTE — Progress Notes (Signed)
Occupational Therapy EVALUATION/discharge  Patient: Brittney Shelton (65 y.o. female)  Date: 07/28/2015  Primary Diagnosis: Respiratory failure (HCC)        Precautions:   Other (comment) (partial code)    ASSESSMENT:   Based on the objective data described below, the patient presents with decreased activity tolerance and hypoxia with activity via baseline 4 L O2. Pt demonstrated supervision/SBA for transfers, HHA x 1 to simulate SPC use in R hand for mobilization to bathroom. Pt completed ADL tasks with supervision to mod I. Reviewed energy conservation techniques for basic ADL tasks as well as appropriate DME to aide with activity tolerance. Pt has good insight into her deficits and was receptive to all education. With mobility from bedside chair to toilet and back, pt desaturated to 77% on 4 L. With seated rest break and cues for PLB pt recovered to 92% in less than 1 minute. Recommend HHOT and PT at discharge.  Further skilled acute occupational therapy is not indicated at this time.  Discharge Recommendations: HHOT  Further Equipment Recommendations for Discharge: shower chair      SUBJECTIVE:   Patient stated ???I got a finger sensor from [place].??? (portable pulse ox)    OBJECTIVE DATA SUMMARY:   HISTORY:   Past Medical History:   Diagnosis Date   ??? Chronic obstructive pulmonary disease (HCC)    ??? Congestive heart failure (HCC)    ??? Diabetes (HCC)    ??? Hypertension    No past surgical history on file.    Prior Level of Function/Home Situation: independent with ADLs, wears 4 L continuously, stands to shower. Prop leg technique for LB dressing EOB.   Expanded or extensive additional review of patient history: COPD, CHF    Home Situation  Home Environment: Private residence  # Steps to Enter: 3  Rails to Enter: Yes  Hand Rails : Left  Wheelchair Ramp: No  One/Two Story Residence: One story  Living Alone: No  Support Systems: Family member(s) (brother)  Patient Expects to be Discharged to:: Private residence   Current DME Used/Available at Home: None  Tub or Shower Type: Tub/Shower combination    Right hand dominant     Left hand dominant    EXAMINATION OF PERFORMANCE DEFICITS:  Cognitive/Behavioral Status:  Neurologic State: Alert  Orientation Level: Oriented X4  Cognition: Follows commands  Perception: Appears intact  Perseveration: No perseveration noted  Safety/Judgement: Awareness of environment  Skin: intact  Edema: distal LEs  Vision/Perceptual:    Tracking: Able to track stimulus in all quadrants w/o difficulty                              Range of Motion:    AROM: Within functional limits (B UEs)                       Strength:    Strength: Generally decreased, functional              Coordination:  Coordination: Within functional limits  Fine Motor Skills-Upper: Left Intact;Right Intact    Gross Motor Skills-Upper: Left Intact;Right Intact  Tone & Sensation:    Tone: Normal  Sensation: Intact                      Balance:  Sitting: Intact;Without support  Standing: Impaired;With support (of SPC in R )  Standing - Static: Good  Standing - Dynamic :  Good (but reaching with LUE at times)    Functional Mobility and Transfers for ADLs:  Bed Mobility:  Rolling:  (received at bedside chair)    Transfers:  Sit to Stand: Supervision  Stand to Sit: Supervision    ADL Assessment:  Feeding: Independent    Oral Facial Hygiene/Grooming: Modified Independent    Bathing: Supervision    Upper Body Dressing: Modified independent    Lower Body Dressing: Supervision , prop leg technique EOB  Toileting: Supervision                ADL Intervention and task modifications:    Pt educated on role of OT and required verbal cues for safety and proper hand placement during ADLs/functional transfers.     Educated pt on use of shower chair to assist with energy conservation during bathing. Pt stated family member has one she can use.     Educated pt on importance of rest breaks and pacing techniques during ADL tasks.                                    Cognitive Retraining  Safety/Judgement: Awareness of environment      Functional Measure:  Barthel Index:    Bathing: 0  Bladder: 10  Bowels: 10  Grooming: 5  Dressing: 10  Feeding: 10  Mobility: 10  Stairs: 0  Toilet Use: 5  Transfer (Bed to Chair and Back): 10  Total: 70       Barthel and G-code impairment scale:  Percentage of impairment CH  0% CI  1-19% CJ  20-39% CK  40-59% CL  60-79% CM  80-99% CN  100%   Barthel Score 0-100 100 99-80 79-60 59-40 20-39 1-19   0   Barthel Score 0-20 20 17-19 13-16 9-12 5-8 1-4 0      The Barthel ADL Index: Guidelines  1. The index should be used as a record of what a patient does, not as a record of what a patient could do.  2. The main aim is to establish degree of independence from any help, physical or verbal, however minor and for whatever reason.  3. The need for supervision renders the patient not independent.  4. A patient's performance should be established using the best available evidence. Asking the patient, friends/relatives and nurses are the usual sources, but direct observation and common sense are also important. However direct testing is not needed.  5. Usually the patient's performance over the preceding 24-48 hours is important, but occasionally longer periods will be relevant.  6. Middle categories imply that the patient supplies over 50 per cent of the effort.  7. Use of aids to be independent is allowed.    Clarisa Kindred., Barthel, D.W. [id number]). Functional evaluation: the Barthel Index. Md State Med J (14)2.  Zenaida Niece der [place], J.J.M.F, [place], Ian Malkin., Margret Chance., [place], [place]. (1999). Measuring the change indisability after inpatient rehabilitation; comparison of the responsiveness of the Barthel Index and Functional Independence Measure. Journal of Neurology, Neurosurgery, and Psychiatry, 66(4), [phone].  Dawson Bills, N.J.A, [place],  W.J.M, & [place], M.A. (2004.)  Assessment of post-stroke quality of life in cost-effectiveness studies: The usefulness of the Barthel Index and the EuroQoL-5D. Quality of Life Research, 13, 098-11         G codes:  In compliance with CMS???s Claims Based Outcome Reporting, the following G-code set was chosen for this  patient based on their primary functional limitation being treated:    The outcome measure chosen to determine the severity of the functional limitation was the Barthel Index with a score of 70/100 which was correlated with the impairment scale.    ? Self Care:    [phone] - CURRENT STATUS: CJ - 20%-39% impaired, limited or restricted   [phone] - GOAL STATUS: CJ - 20%-39% impaired, limited or restricted   [id number] - D/C STATUS:  CJ - 20%-39% impaired, limited or restricted     Occupational Therapy Evaluation Charge Determination   History Examination Decision-Making   LOW Complexity : Brief history review  LOW Complexity : 1-3 performance deficits relating to physical, cognitive , or psychosocial skils that result in activity limitations and / or participation restrictions  LOW Complexity : No comorbidities that affect functional and no verbal or physical assistance needed to complete eval tasks       Based on the above components, the patient evaluation is determined to be of the following complexity level: LOW   Pain:  Pain Scale 1: Numeric (0 - 10)  Pain Intensity 1: 0              Activity Tolerance:   77% on 4 L but within 1 minute seated rest break, cues for PLB O2 recovered to 92%.   Please refer to the flowsheet for vital signs taken during this treatment.  After treatment:     Patient left in no apparent distress sitting up in chair    Patient left in no apparent distress in bed    Call bell left within reach    Nursing notified    Caregiver present    Bed alarm activated    COMMUNICATION/EDUCATION:   Communication/Collaboration:        Home safety education was provided and the patient/caregiver indicated understanding.         Patient/family have participated as able and agree with findings and recommendations.        Patient is unable to participate in plan of care at this time.  Findings and recommendations were discussed with: Physical Therapist and Registered Nurse    Haze Rushing, OT  Time Calculation: 14 mins

## 2015-07-29 LAB — GLUCOSE, POC
Glucose (POC): 238 mg/dL — ABNORMAL HIGH (ref 65–100)
Glucose (POC): 45 mg/dL — CL (ref 65–100)
Glucose (POC): 47 mg/dL — CL (ref 65–100)
Glucose (POC): 59 mg/dL — ABNORMAL LOW (ref 65–100)
Glucose (POC): 97 mg/dL (ref 65–100)
Glucose (POC): 119 mg/dL — ABNORMAL HIGH (ref 65–100)

## 2015-07-29 MED FILL — ISOSORBIDE DINITRATE 20 MG TAB: 20 mg | ORAL | Qty: 2

## 2015-07-29 MED FILL — PRAVASTATIN 10 MG TAB: 10 mg | ORAL | Qty: 2

## 2015-07-29 MED FILL — FUROSEMIDE 40 MG TAB: 40 mg | ORAL | Qty: 1

## 2015-07-29 MED FILL — BD POSIFLUSH NORMAL SALINE 0.9 % INJECTION SYRINGE: INTRAMUSCULAR | Qty: 10

## 2015-07-29 MED FILL — PREDNISONE 20 MG TAB: 20 mg | ORAL | Qty: 2

## 2015-07-29 MED FILL — IPRATROPIUM-ALBUTEROL 2.5 MG-0.5 MG/3 ML NEB SOLUTION: 2.5 mg-0.5 mg/3 ml | RESPIRATORY_TRACT | Qty: 3

## 2015-07-29 MED FILL — GLIMEPIRIDE 1 MG TAB: 1 mg | ORAL | Qty: 4

## 2015-07-29 MED FILL — CHILDREN'S ASPIRIN 81 MG CHEWABLE TABLET: 81 mg | ORAL | Qty: 1

## 2015-07-29 MED FILL — LOVENOX 40 MG/0.4 ML SUBCUTANEOUS SYRINGE: 40 mg/0.4 mL | SUBCUTANEOUS | Qty: 0.4

## 2015-07-29 MED FILL — AMLODIPINE 5 MG TAB: 5 mg | ORAL | Qty: 2

## 2015-07-29 MED FILL — DEXTROSE 50% IN WATER (D50W) IV SYRG: INTRAVENOUS | Qty: 50

## 2015-07-29 MED FILL — CALCIUM CARBONATE 500 MG (1250 MG) TAB: 500 mg calcium (1,250 mg) | ORAL | Qty: 1

## 2015-07-29 MED FILL — TOPROL XL 50 MG TABLET,EXTENDED RELEASE: 50 mg | ORAL | Qty: 2

## 2015-07-29 MED FILL — INSULIN LISPRO 100 UNIT/ML INJECTION: 100 unit/mL | SUBCUTANEOUS | Qty: 1

## 2015-07-29 MED FILL — CLONIDINE 0.1 MG TAB: 0.1 mg | ORAL | Qty: 1

## 2015-07-29 NOTE — Progress Notes (Addendum)
Will discuss FOC for HHC with pt pending d/c.  10a D/C discussed with pt who would like [facility] to follow at d/c.  FOC signed.  Orders sent to [facility].  [phone]    [address] in [facility] can accept and will follow pt for nursing//OT on Tues//Wed.

## 2015-07-29 NOTE — Other (Signed)
Bedside and Verbal shift change report given to Tammi Sou RN (oncoming nurse) by Randolph Bing (offgoing nurse). Report included the following information SBAR, Kardex, MAR, and Recent Results  ????  Zone Phone: [phone]????  Significant changes during shift:       Without complaints during night       BS 45 this am                               Patient Information  ????  Brittney Shelton  65 y.o.  07/26/2015 3:08 AM by Nestor Lewandowsky, MD. [name] was admitted from Home  ????  Problem List  ????  ???? ???? ????   Patient Active Problem List   ???? Diagnosis Date Noted   ??? Respiratory failure (HCC) 07/26/2015   ??? Hypoglycemia 07/26/2015   ??? COPD (chronic obstructive pulmonary disease) (HCC) 07/26/2015   ??? Hypertension, uncontrolled 09/29/2014   ??? Well controlled type 2 diabetes mellitus (HCC) 09/29/2014   ??? Hypercholesterolemia 09/29/2014   ??? 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 Chronic acquired lymphedema 09/29/2014   ????  ???? ???? ????   Past Medical History:   Diagnosis Date   ??? Chronic obstructive pulmonary disease (HCC) ????   ??? Congestive heart failure (HCC) ????   ??? Diabetes (HCC) ????   ??? Hypertension ????   ????  ????  ????  Core Measures:  ????  CVA: No No  CHF:yes yes  PNA:No No  ????  Post Op Surgical (If Applicable):   ????  n/a  ????  Activity Status:  ????  OOB to Chair YES  Ambulated this shift: YES  Bed Rest No  ????  Supplemental O2: (If Applicable)  ????  NC Yes  NRB No  Venti-mask No  On 4 Liters/min  ????  ????  LINES AND DRAINS:  ????  22 in the left hand, saline locked  ????  DVT prophylaxis:  ????  DVT prophylaxis Med- Yes, lovenox  DVT prophylaxis SCD or TED- No   ????  Wounds: (If Applicable)  ????  Wounds- No  ????  Location -  ????  Patient Safety:  ????  Falls Score Total Score: 4  Safety Level_______  Bed Alarm On? Yes  Sitter? No  ????  Plan for upcoming shift: nebs, monitor respiratory effort, diuretics  ????  ????  ????  Discharge Plan: Yes when stable  ????  Active Consults:  None  ????  ????  ????   ????       ???? ????    ????    ??  ??   ??       Revision History     ?? Date/Time User Provider Type Action   ?? 07/28/15 0802 Maebelle Munroe, RN Registered Nurse Addend   ?? 07/28/15 2130 Maebelle Munroe, RN Registered Nurse Sign   ?? View Details Report      ??   ?? ??   ??

## 2015-07-29 NOTE — Progress Notes (Signed)
Patient discharged home with brother via brother's personal vehicle.  Discharge instructions given, as well as follow-up information, medications, side-effects, and prescriptions.    An opportunity for questions was presented, and patient verbalized an understanding.

## 2015-07-29 NOTE — Other (Signed)
Home Health Care Discharge Planning: [facility]  Face to Face Encounter      NAME: Maryetta Shafer   DOB:  [birth year]   MRN:  [id number]     Primary Diagnosis:   Acute on chronic respiratory faiure  Acute diastolic heart failure/COPD/HTN    Date of Face to Face:  12/01/2015 12:16 PM                                  Face to Face Encounter findings are related to primary reason for home care:   YES    1. I certify that the patient needs intermittent skilled nursing care, physical therapy and/or speech therapy.  I will not be following this patient in the Community and Dr.  Erenest Rasher, MD will be responsible for signing the Home Health Plan of Care.    2. Initial Orders for Care: Occupational Therapy    3. I certify that this patient is homebound because of illness or injury, need the aid of supportive devices such as crutches, canes, wheelchairs, and walkers; the use of special transportation; or the assistance of another person in order to leave their place of residence.  There exists a normal inability to leave home and leaving home requires a considerable and taxing effort.     4. I certify that this patient is under my care and that I had a Face-to-Face Encounter that meets the physician Face-to-Face Encounter requirements.  Document the physical findings from the Face-to-Face Encounter that support the need for skilled services: Has new finding of weakness and altered mobility that requires skilled physical/occupational and/or speech therapy services for evaluation and interventions.     Lynnea Ferrier, MD  Discharging Physician                 Office:  [phone]  Fax:    [phone]

## 2015-07-30 NOTE — Progress Notes (Signed)
Spoke with patient after verifying name and DOB. Patient informed this Clinical research associate she is feeling 100% better than when admitted to hospital. Denies SOB. She has not weighed for today but was given scale in hospital. Patient educated on importance of weighing daily. Call provider if increase by 3 pounds in a day or 5 pounds in a week. Plan is for patient to place scale where visible so she remembers to weigh daily. If patient has not by [facility] appointment agrees for this writer to call and remind.  Patient lives with brother and he does cooking. Patient has not had contact with [facility] at this time. Patient was unable to fill prescriptions for inhaler and prednisone due to pharmacy being closed however brother is picking up prescriptions for her today. Patient has TOC appointment wit PCP on 08/01/15 however was not informed to follow up with cardiologist for CHF. Patient has asked for recommendation (saw Hageman in hospital). Patient has ACP on fil.

## 2015-07-31 LAB — CULTURE, BLOOD, PAIRED: Culture result:: NO GROWTH

## 2015-08-01 ENCOUNTER — Ambulatory Visit: Admit: 2015-08-01 | Discharge: 2015-08-01 | Payer: MEDICARE | Attending: Internal Medicine | Primary: Internal Medicine

## 2015-08-01 DIAGNOSIS — E119 Type 2 diabetes mellitus without complications: Secondary | ICD-10-CM

## 2015-08-01 LAB — AMB POC GLUCOSE BLOOD, BY GLUCOSE MONITORING DEVICE

## 2015-08-01 MED ORDER — AMLODIPINE 10 MG TAB
10 mg | ORAL_TABLET | Freq: Every day | ORAL | 3 refills | Status: DC
Start: 2015-08-01 — End: 2015-08-29

## 2015-08-01 MED ORDER — METFORMIN 500 MG TAB
500 mg | ORAL_TABLET | Freq: Two times a day (BID) | ORAL | 5 refills | Status: DC
Start: 2015-08-01 — End: 2015-11-25

## 2015-08-01 NOTE — Progress Notes (Signed)
1. Have you been to the ER, urgent care clinic since your last visit?  Hospitalized since your last visit?Yes Where: [facility]    2. Have you seen or consulted any other health care providers outside of the [facility] since your last visit?  Include any pap smears or colon screening. No     Pt is here for hosp follow for elevated blood sugar, bp.

## 2015-08-01 NOTE — Progress Notes (Signed)
Chief Complaint   Patient presents with   ??? Hospital Follow Up     HPI:  Brittney Shelton is a 65 y.o. AA female presents to transition care. Patient was admitted 2/23 through 07/29/15 Acute on chronic respiratory failure, Acute diastolic heart failure, COPD exacerbation and elevated blood pressure. Patient is doing good she has no complaints.    Review of Systems  As per hpi    Past Medical History:   Diagnosis Date   ??? Chronic obstructive pulmonary disease (HCC)    ??? Congestive heart failure (HCC)    ??? Diabetes (HCC)    ??? Hypertension      History reviewed. No pertinent surgical history.     Social History   ??? Marital status: SINGLE     Spouse name: N/A   ??? Number of children: N/A   ??? Years of education: N/A     Social History Main Topics   ??? Smoking status: Never Smoker   ??? Smokeless tobacco: Never Used   ??? Alcohol use No   ??? Drug use: No   ??? Sexual activity: No     Current Outpatient Prescriptions   Medication Sig Dispense Refill   ??? predniSONE (DELTASONE) 20 mg tablet Take 2 Tabs by mouth daily. 3 Tab 0   ??? albuterol (PROVENTIL HFA, VENTOLIN HFA, PROAIR HFA) 90 mcg/actuation inhaler Take 1 Puff by inhalation every six (6) hours as needed for Wheezing. 1 Inhaler 0   ??? furosemide (LASIX) 40 mg tablet Take 1 tablet two times a day.  Indications: Edema 180 Tab 1   ??? amLODIPine (NORVASC) 10 mg tablet Take 1 Tab by mouth daily. 90 Tab 1   ??? metoprolol succinate (TOPROL-XL) 200 mg XL tablet TAKE 1 TABLET BY MOUTH DAILY FOR HIGH BLOOD PRESSURE 90 Tab 5   ??? lisinopril (PRINIVIL, ZESTRIL) 40 mg tablet TAKE 1 TABLET BY MOUTH ONCE DAILY 90 Tab 5   ??? isosorbide dinitrate (ISORDIL) 30 mg tablet TAKE 1 TABLET BY MOUTH THREE TIMES DAILY 270 Tab 5   ??? glimepiride (AMARYL) 4 mg tablet TAKE 1 TABLET BY MOUTH ONCE EVERY MORNING 90 Tab 5   ??? cloNIDine HCl (CATAPRES) 0.1 mg tablet TAKE 1 TABLET BY MOUTH ONCE DAILY 90 Tab 5   ??? pravastatin (PRAVACHOL) 20 mg tablet Take 1 Tab by mouth nightly. 30 Tab 5    ??? aspirin 81 mg chewable tablet Take 81 mg by mouth daily.     ??? calcium carbonate (OS-CAL) 500 mg calcium (1,250 mg) tablet Take  by mouth daily.     ??? Oxygen      ??? metFORMIN (GLUCOPHAGE) 1,000 mg tablet TAKE 1 TABLET BY MOUTH TWICE DAILY WITH MEALS 180 Tab 5     No Known Allergies    Objective:  Visit Vitals   ??? BP 141/70 (BP 1 Location: Left arm, BP Patient Position: Sitting)   ??? Pulse 67   ??? Temp 96.5 ??F (35.8 ??C) (Oral)   ??? Resp 22   ??? Ht 5\' 6"  (1.676 m)   ??? Wt 302 lb (137 kg)   ??? SpO2 90%  Comment: 4liters nasal O2   ??? BMI 48.74 kg/m2     Physical Exam:   General appearance - alert, obese appearing, in no distress  Mental status - alert, oriented to person, place, and time  EYE-PERRL, EOMI  Neck - supple, no significant adenopathy   Chest - clear to auscultation, no wheezes, rales or rhonchi  Heart -  normal rate, regular rhythm, stable blood pressure   Abdomen - soft, nontender, nondistended, no organomegaly  Ext-peripheral pulses normal, no pedal edema  Neuro -alert, oriented, normal speech, no focal findings    Assessment/Plan:    ICD-10-CM ICD-9-CM    1. Well controlled type 2 diabetes mellitus (HCC) E11.9 250.00 AMB POC GLUCOSE BLOOD, BY GLUCOSE MONITORING DEVICE      metFORMIN (GLUCOPHAGE) 500 mg tablet   2. Acute infective exacerbation of chronic obstructive airway disease (HCC) J44.1 491.21    3. Hypercholesterolemia E78.00 272.0    4. Hypertension goal BP (blood pressure) < 130/80 I10 401.9 REFERRAL TO CARDIOLOGY      amLODIPine (NORVASC) 10 mg tablet   5. Acute diastolic CHF (congestive heart failure) (HCC) I50.31 428.31 REFERRAL TO CARDIOLOGY     428.0      Patient Instructions        Low Sodium Diet (2,000 Milligram): Care Instructions  Your Care Instructions  Too much sodium causes your body to hold on to extra water. This can raise your blood pressure and force your heart and kidneys to work harder. In very serious cases, this could cause you to be put in the hospital. It  might even be life-threatening. By limiting sodium, you will feel better and lower your risk of serious problems.  The most common source of sodium is salt. People get most of the salt in their diet from canned, prepared, and packaged foods. Fast food and restaurant meals also are very high in sodium. Your doctor will probably limit your sodium to less than 2,000 milligrams (mg) a day. This limit counts all the sodium in prepared and packaged foods and any salt you add to your food.  Follow-up care is a key part of your treatment and safety. Be sure to make and go to all appointments, and call your doctor if you are having problems. It's also a good idea to know your test results and keep a list of the medicines you take.  How can you care for yourself at home?  Read food labels  ?? Read labels on cans and food packages. The labels tell you how much sodium is in each serving. Make sure that you look at the serving size. If you eat more than the serving size, you have eaten more sodium.  ?? Food labels also tell you the Percent Daily Value for sodium. Choose products with low Percent Daily Values for sodium.  ?? Be aware that sodium can come in forms other than salt, including monosodium glutamate (MSG), sodium citrate, and sodium bicarbonate (baking soda). MSG is often added to Asian food. When you eat out, you can sometimes ask for food without MSG or added salt.  Buy low-sodium foods  ?? Buy foods that are labeled "unsalted" (no salt added), "sodium-free" (less than 5 mg of sodium per serving), or "low-sodium" (less than 140 mg of sodium per serving). Foods labeled "reduced-sodium" and "light sodium" may still have too much sodium. Be sure to read the label to see how much sodium you are getting.  ?? Buy fresh vegetables, or frozen vegetables without added sauces. Buy low-sodium versions of canned vegetables, soups, and other canned goods.  Prepare low-sodium meals   ?? Cut back on the amount of salt you use in cooking. This will help you adjust to the taste. Do not add salt after cooking. One teaspoon of salt has about 2,300 mg of sodium.  ?? Take the salt shaker off the table.  ??  Flavor your food with garlic, lemon juice, onion, vinegar, herbs, and spices. Do not use soy sauce, lite soy sauce, steak sauce, onion salt, garlic salt, celery salt, mustard, or ketchup on your food.  ?? Use low-sodium salad dressings, sauces, and ketchup. Or make your own salad dressings and sauces without adding salt.  ?? Use less salt (or none) when recipes call for it. You can often use half the salt a recipe calls for without losing flavor. Other foods such as rice, pasta, and grains do not need added salt.  ?? Rinse canned vegetables, and cook them in fresh water. This removes some???but not all???of the salt.  ?? Avoid water that is naturally high in sodium or that has been treated with water softeners, which add sodium. Call your local water company to find out the sodium content of your water supply. If you buy bottled water, read the label and choose a sodium-free brand.  Avoid high-sodium foods  ?? Avoid eating:  ?? Smoked, cured, salted, and canned meat, fish, and poultry.  ?? Ham, bacon, hot dogs, and luncheon meats.  ?? Regular, hard, and processed cheese and regular peanut butter.  ?? Crackers with salted tops, and other salted snack foods such as pretzels, chips, and salted popcorn.  ?? Frozen prepared meals, unless labeled low-sodium.  ?? Canned and dried soups, broths, and bouillon, unless labeled sodium-free or low-sodium.  ?? Canned vegetables, unless labeled sodium-free or low-sodium.  ?? [place] fries, pizza, tacos, and other fast foods.  ?? Pickles, olives, ketchup, and other condiments, especially soy sauce, unless labeled sodium-free or low-sodium.  Where can you learn more?  Go to [employer].   Enter [phone] in the search box to learn more about "Low Sodium Diet (2,000 Milligram): Care Instructions."  Current as of: December 26, 2014  Content Version: 11.1  ?? 2006-2016 [employer]. Care instructions adapted under license by [employer] (which disclaims liability or warranty for this information). If you have questions about a medical condition or this instruction, always ask your healthcare professional. [employer], Inc[employer] disclaims any warranty or liability for your use of this information.      Follow-up Disposition:  Return in about 3 months (around 11/01/2015), or if symptoms worsen or fail to improve, for routine follow up.

## 2015-08-01 NOTE — Patient Instructions (Addendum)
Low Sodium Diet (2,000 Milligram): Care Instructions  Your Care Instructions  Too much sodium causes your body to hold on to extra water. This can raise your blood pressure and force your heart and kidneys to work harder. In very serious cases, this could cause you to be put in the hospital. It might even be life-threatening. By limiting sodium, you will feel better and lower your risk of serious problems.  The most common source of sodium is salt. People get most of the salt in their diet from canned, prepared, and packaged foods. Fast food and restaurant meals also are very high in sodium. Your doctor will probably limit your sodium to less than 2,000 milligrams (mg) a day. This limit counts all the sodium in prepared and packaged foods and any salt you add to your food.  Follow-up care is a key part of your treatment and safety. Be sure to make and go to all appointments, and call your doctor if you are having problems. It's also a good idea to know your test results and keep a list of the medicines you take.  How can you care for yourself at home?  Read food labels  ?? Read labels on cans and food packages. The labels tell you how much sodium is in each serving. Make sure that you look at the serving size. If you eat more than the serving size, you have eaten more sodium.  ?? Food labels also tell you the Percent Daily Value for sodium. Choose products with low Percent Daily Values for sodium.  ?? Be aware that sodium can come in forms other than salt, including monosodium glutamate (MSG), sodium citrate, and sodium bicarbonate (baking soda). MSG is often added to Asian food. When you eat out, you can sometimes ask for food without MSG or added salt.  Buy low-sodium foods  ?? Buy foods that are labeled "unsalted" (no salt added), "sodium-free" (less than 5 mg of sodium per serving), or "low-sodium" (less than 140 mg of sodium per serving). Foods labeled "reduced-sodium" and "light sodium"  may still have too much sodium. Be sure to read the label to see how much sodium you are getting.  ?? Buy fresh vegetables, or frozen vegetables without added sauces. Buy low-sodium versions of canned vegetables, soups, and other canned goods.  Prepare low-sodium meals  ?? Cut back on the amount of salt you use in cooking. This will help you adjust to the taste. Do not add salt after cooking. One teaspoon of salt has about 2,300 mg of sodium.  ?? Take the salt shaker off the table.  ?? Flavor your food with garlic, lemon juice, onion, vinegar, herbs, and spices. Do not use soy sauce, lite soy sauce, steak sauce, onion salt, garlic salt, celery salt, mustard, or ketchup on your food.  ?? Use low-sodium salad dressings, sauces, and ketchup. Or make your own salad dressings and sauces without adding salt.  ?? Use less salt (or none) when recipes call for it. You can often use half the salt a recipe calls for without losing flavor. Other foods such as rice, pasta, and grains do not need added salt.  ?? Rinse canned vegetables, and cook them in fresh water. This removes some???but not all???of the salt.  ?? Avoid water that is naturally high in sodium or that has been treated with water softeners, which add sodium. Call your local water company to find out the sodium content of your water supply. If you buy bottled water,   read the label and choose a sodium-free brand.  Avoid high-sodium foods  ?? Avoid eating:  ?? Smoked, cured, salted, and canned meat, fish, and poultry.  ?? Ham, bacon, hot dogs, and luncheon meats.  ?? Regular, hard, and processed cheese and regular peanut butter.  ?? Crackers with salted tops, and other salted snack foods such as pretzels, chips, and salted popcorn.  ?? Frozen prepared meals, unless labeled low-sodium.  ?? Canned and dried soups, broths, and bouillon, unless labeled sodium-free or low-sodium.  ?? Canned vegetables, unless labeled sodium-free or low-sodium.   ?? French fries, pizza, tacos, and other fast foods.  ?? Pickles, olives, ketchup, and other condiments, especially soy sauce, unless labeled sodium-free or low-sodium.  Where can you learn more?  Go to http://www.healthwise.net/[employer].  Enter V843 in the search box to learn more about "Low Sodium Diet (2,000 Milligram): Care Instructions."  Current as of: December 26, 2014  Content Version: 11.1  ?? 2006-2016 [employer]. Care instructions adapted under license by [employer] (which disclaims liability or warranty for this information). If you have questions about a medical condition or this instruction, always ask your healthcare professional. Healthwise, Incorporated disclaims any warranty or liability for your use of this information.

## 2015-08-01 NOTE — Progress Notes (Signed)
Met with patient before office visit name and DOB verified. Patient denies swelling and SOB. Patient is currently wearing oxygen @ 4L NC. She has concentrator at home. Patient weight for last two days have been 302 pounds. She will need recommendation from PCP regarding cardiologist. This writer contact information provided to patient. Will continue to follow patient.      Per review of chart referral given to Dr. Leontine Locket

## 2015-08-01 NOTE — Telephone Encounter (Signed)
Pt had apttmnt this morning and was told to call back w/info:     The heart doctor that saw her was dr. Thereasa Parkin at [phone]    Best number to reach her is [phone]

## 2015-08-01 NOTE — Telephone Encounter (Signed)
Rinaldo Cloud w/[facility] @ [phone]    They will go out and see pt on 08-02-15

## 2015-08-02 ENCOUNTER — Encounter: Primary: Internal Medicine

## 2015-08-02 ENCOUNTER — Encounter: Admit: 2015-08-02 | Discharge: 2015-08-02 | Payer: MEDICARE | Primary: Internal Medicine

## 2015-08-03 ENCOUNTER — Encounter: Admit: 2015-08-03 | Discharge: 2015-08-03 | Payer: MEDICARE | Primary: Internal Medicine

## 2015-08-03 NOTE — Telephone Encounter (Signed)
Pls call Brittney Shelton w[facility]   She admitted pt on 08-02-15     Has a question about prednisone     Best number to reach her is [phone]

## 2015-08-04 ENCOUNTER — Encounter: Admit: 2015-08-04 | Discharge: 2015-08-04 | Payer: MEDICARE | Primary: Internal Medicine

## 2015-08-06 ENCOUNTER — Encounter: Admit: 2015-08-06 | Discharge: 2015-08-06 | Payer: MEDICARE | Primary: Internal Medicine

## 2015-08-06 NOTE — Telephone Encounter (Signed)
LVM fo inform sarah we were returning her call

## 2015-08-07 ENCOUNTER — Encounter: Admit: 2015-08-07 | Discharge: 2015-08-07 | Payer: MEDICARE | Primary: Internal Medicine

## 2015-08-07 NOTE — Progress Notes (Signed)
Spoke with patient after verifying name and DOB. Pateitn informed this Clinical research associatewriter is felling well. She is receiving nursing help 3 days per week and PT 2 days per week. She was able to walk driveway 3 times. She reports weight as 300 pounds today. Denies swelling, SOB. Patient able to verbalize to contact office if weight increase 3 pounds in a day or 5 pounds in a week. Patient will contact cardiology office to schedule appointment. Dr. Orvil FeilKaminsky office contact number provided to patient. This Clinical research associatewriter contact information also provided. Will follow up with patient Friday, 08/10/15

## 2015-08-08 ENCOUNTER — Encounter: Admit: 2015-08-08 | Discharge: 2015-08-08 | Payer: MEDICARE | Primary: Internal Medicine

## 2015-08-09 ENCOUNTER — Encounter: Primary: Internal Medicine

## 2015-08-10 ENCOUNTER — Encounter: Admit: 2015-08-10 | Discharge: 2015-08-10 | Payer: MEDICARE | Primary: Internal Medicine

## 2015-08-11 ENCOUNTER — Encounter: Admit: 2015-08-11 | Discharge: 2015-08-11 | Payer: MEDICARE | Primary: Internal Medicine

## 2015-08-13 ENCOUNTER — Encounter: Admit: 2015-08-13 | Discharge: 2015-08-13 | Payer: MEDICARE | Primary: Internal Medicine

## 2015-08-14 ENCOUNTER — Encounter: Admit: 2015-08-14 | Discharge: 2015-08-14 | Payer: MEDICARE | Primary: Internal Medicine

## 2015-08-14 NOTE — Progress Notes (Signed)
Spoke with patient after verifying name and DOB. Patient is still receiving home health nursing and PT.     CHF-Patient reports weight a 300 pounds. No change from last week. Patient has scheduled cardiology appointment with Dr. Orvil FeilKaminsky on 09/05/15 as directed from PCP appointment. Blood pressure today was 130/70.    COPD- Patient unable to tell this writer how long she has been wearing oxygen. DME company is [employer] and [facility]. Patient does have pulse ox at home and was able to verbalize how to use it. Patient educated on importance of breathing techniques and monitoring oxygen level.     DM- patient had hypoglycemic episode prior to hospitalization. Patient is currently taking Metformin and Amaryl. Hgb A1C Jan 2016 was 5.5, April 2016 was 5.9, Jan 2017 was 5.4. Patient reported FBS today, 08/14/15 was 120. Patient has not been keeping log of blood sugar. Patient educated on importance of keeping log, checking blood sugar either before meals or 2 hours after meals, and bring log to appointments.    Plan- Patient will start to keep log of blood sugars, continue to weigh daily and log, and log blood pressure when nurses take, practice breathing technique. Bring all logs to appointments. This Clinical research associatewriter will follow up with patient next week.     This Clinical research associatewriter mailed COPD educational materials regarding breathing techniques, COPD triggers,COPD exacerbation plan, and saving energy when you have a chronic condition to address on file.

## 2015-08-15 ENCOUNTER — Encounter: Primary: Internal Medicine

## 2015-08-16 ENCOUNTER — Encounter: Primary: Internal Medicine

## 2015-08-17 ENCOUNTER — Encounter: Primary: Internal Medicine

## 2015-08-17 ENCOUNTER — Encounter: Admit: 2015-08-17 | Discharge: 2015-08-17 | Payer: MEDICARE | Primary: Internal Medicine

## 2015-08-20 ENCOUNTER — Encounter: Primary: Internal Medicine

## 2015-08-22 NOTE — Progress Notes (Signed)
Spoke with patient after verifying name and DOB. Patient informed this writer her weight is 299 pounds and blood sugar is 119 today, 08/22/15. She has not received COPD education that this Clinical research associatewriter mailed. Denies chest pain, SOB. Does have some swelling in the evenings and she elevates feet. She is still receiving home health. Patient expressed no issues or concerns at this time. Plan to follow up with next week, patient will call if needed, discuss COPD education with patient next week after she read over materials once received in mail.

## 2015-08-24 ENCOUNTER — Encounter: Admit: 2015-08-24 | Discharge: 2015-08-24 | Payer: MEDICARE | Primary: Internal Medicine

## 2015-08-27 ENCOUNTER — Encounter: Primary: Internal Medicine

## 2015-08-28 ENCOUNTER — Encounter

## 2015-08-28 ENCOUNTER — Encounter: Primary: Internal Medicine

## 2015-08-29 ENCOUNTER — Inpatient Hospital Stay
Admit: 2015-08-29 | Discharge: 2015-08-29 | Disposition: A | Discharge status: Home / Self Care | Payer: MEDICARE | Attending: Emergency Medicine

## 2015-08-29 DIAGNOSIS — E1165 Type 2 diabetes mellitus with hyperglycemia: Secondary | ICD-10-CM

## 2015-08-29 LAB — GLUCOSE, POC
Glucose (POC): 66 mg/dL (ref 65–100)
Glucose (POC): 80 mg/dL (ref 65–100)
Glucose (POC): 136 mg/dL — ABNORMAL HIGH (ref 65–100)

## 2015-08-29 MED ORDER — AMLODIPINE 10 MG TAB
10 mg | ORAL_TABLET | ORAL | 3 refills | Status: DC
Start: 2015-08-29 — End: 2016-08-06

## 2015-08-29 NOTE — ED Notes (Signed)
Patient (s)  given copy of dc instructions and 0 paper script(s) and 0 electronic scripts. Patient (s)  verbalized understanding of instructions and script (s). Patient given a current medication reconciliation form and verbalized understanding of their medications. Patient (s) verbalized understanding of the importance of discussing medications with his or her physician or clinic they will be following up with. Patient alert and oriented and in no acute distress. Patient offered wheelchair from treatment area to hospital entrance, patient denied wheelchair.

## 2015-08-29 NOTE — ED Provider Notes (Signed)
HPI Comments: Brittney Shelton is a 65 y.o. female with history significant for HTN and DM presenting via EMS to [facility] ED with c/o sudden onset of hypoglycemia. Per EMS, pt's BG was in the 20's upon their arrival to the pt's home. EMS reports administering GCS 15, followed by an onset of some "confused conversation", and readministration of oral glucose 60. EMS states following that, the pt's BG came up to 36. EMS notes pt then ate a peanut butter and jelly sandwich which further improved her BG to 44 which was the last reading before transport to the ED. Pt's BG was 66 upon arrival to the ED. Of note, EMS states that the pt's vitals were normal with the O2 saturation in the low 90's. Pt reports that is baseline and notes she is on 4L NC at home. Pt denies any abnormal eating habits, forgetting a meal, or forgetting her correct insulin dosage. Pt additionally specifically denies any nausea, vomiting, fevers, chills, abdominal pain, urinary complications, chest pain, or SOB.    PCP: Erenest Rasher, MD    PMHx: CHF, COPD  Social Hx: - EtOH; - Smoker; - Illicit Drugs    There are no other changes, complaints or physical findings at this time.     The history is provided by the patient and the EMS personnel.      Past Medical History:   Diagnosis Date   ??? Chronic obstructive pulmonary disease (HCC)    ??? Congestive heart failure (HCC)    ??? Diabetes (HCC)    ??? Hypertension      History reviewed. No pertinent surgical history.      Family History:   Problem Relation Age of Onset   ??? Diabetes Mother    ??? Arthritis-osteo Father    ??? COPD Sister    ??? Diabetes Brother    ??? Diabetes Brother      Social History     Social History   ??? Marital status: SINGLE     Spouse name: N/A   ??? Number of children: N/A   ??? Years of education: N/A     Occupational History   ??? Not on file.     Social History Main Topics   ??? Smoking status: Never Smoker   ??? Smokeless tobacco: Never Used   ??? Alcohol use No   ??? Drug use: No   ??? Sexual activity: No      Other Topics Concern   ??? Not on file     Social History Narrative     ALLERGIES: Review of patient's allergies indicates no known allergies.    Review of Systems   Constitutional: Negative for chills, diaphoresis and fever.   HENT: Negative for rhinorrhea and sore throat.    Eyes: Negative for visual disturbance.   Respiratory: Negative for cough and shortness of breath.    Cardiovascular: Negative for chest pain.   Gastrointestinal: Negative for abdominal pain, constipation, diarrhea, nausea and vomiting.   Endocrine:        + low BG   Genitourinary: Negative for dysuria, frequency, hematuria and urgency.   Musculoskeletal: Negative for back pain.   Skin: Negative for wound.   Neurological: Negative for syncope, numbness and headaches.     Vitals:    08/29/15 0623   BP: 142/65   Pulse: 67   Resp: 18   Temp: 98.2 ??F (36.8 ??C)   SpO2: 97%   Weight: 136.1 kg (300 lb)   Height:  (1.676  m)          Physical Exam   Constitutional: She is oriented to person, place, and time. She appears well-developed and well-nourished. No distress.   HENT:   Head: Normocephalic and atraumatic.   Eyes: EOM are normal. Pupils are equal, round, and reactive to light.   Neck: Normal range of motion.   Cardiovascular: Normal rate, regular rhythm and normal heart sounds.  Exam reveals no gallop and no friction rub.    No murmur heard.  Pulmonary/Chest: Effort normal and breath sounds normal. No respiratory distress. She has no wheezes. She has no rales. She exhibits no tenderness.   Abdominal: Soft. Bowel sounds are normal. She exhibits no distension and no mass. There is no tenderness. There is no rebound and no guarding.   Musculoskeletal: Normal range of motion. She exhibits no edema, tenderness or deformity.   Neurological: She is alert and oriented to person, place, and time. No cranial nerve deficit.   Skin: Skin is warm. No rash noted. She is not diaphoretic. No erythema.   Psychiatric: Her behavior is normal.    Nursing note and vitals reviewed.     MDM  Number of Diagnoses or Management Options  Diagnosis management comments: DDx: hypoglycemia, inadequate feeding        Amount and/or Complexity of Data Reviewed  Clinical lab tests: ordered and reviewed  Obtain history from someone other than the patient: yes (EMS)  Review and summarize past medical records: yes    Patient Progress  Patient progress: stable    ED Course     Procedures     Progress Note:   6:46 AM  Pt provided orange juice, peanut butter, and crackers following evaluation by MD. Following ingestion, pt's BG improved to 80.   Written by Joanna Hewseeksha Jain, ED Scribe, as dictated by Blenda MountsJames W Forest-Lam, MD.     LABORATORY TESTS:  Recent Results (from the past 12 hour(s))   GLUCOSE, POC    Collection Time: 08/29/15  6:21 AM   Result Value Ref Range    Glucose (POC) 66 65 - 100 mg/dL    Performed by Laurena SpiesArdon Cynthia (PCT)    GLUCOSE, POC    Collection Time: 08/29/15  6:46 AM   Result Value Ref Range    Glucose (POC) 80 65 - 100 mg/dL    Performed by Laurena SpiesArdon Cynthia (PCT)      IMPRESSION:  1. Hyperglycemia      PLAN:  1.   Current Discharge Medication List        2.   Follow-up Information     Follow up With Details Comments Contact Info    Erenest RasherNau D Domah, MD   [address]  [address]  [place] [id number]  [phone]          Return to ED if worse     DISCHARGE NOTE:    7:04 AM  The patient is ready for discharge. The patient signs, symptoms, diagnosis, and discharge instructions have been discussed and the patient has conveyed their understanding. The patient is to follow-up as reccommended or returned to the ER should their symptoms worsen. Plan has been discussed and patient is in agreement.     This note above is prepared by Joanna Hewseeksha Jain acting as Scribe for Blenda MountsJames W Forest-Lam, MD.    Blenda MountsJames W Forest-Lam, MD: This scribe's documentation has been prepared under my direction and personally reviewed by me in its entirety. I  confirm that the note  above accurately reflects all work, treatment procedures and medical decision makings performed by me.

## 2015-08-29 NOTE — ED Notes (Signed)
Per provider, recheck BG once pt has finished eating

## 2015-08-29 NOTE — ED Notes (Signed)
Bedside and Verbal shift change report given to Mamta, RN  (oncoming nurse) by me (offgoing nurse). Report included the following information SBAR, Kardex, ED Summary, Procedure Summary, Intake/Output, MAR and Recent Results.

## 2015-08-29 NOTE — ED Triage Notes (Addendum)
Pt arrives to ED via EMS with c/o low blood sugar. Per EMS report, pts BG was in the 20s on arrival to the home. Pt was given glucose and BG came up to 42. Pt has been alert and orientated X 4. Vitals stable. Pt arrived to ED with 4LNC. Pt uses 4L O2 at home so pt is at her baseline O2 sats 98%. Pt is in NAD at this time. Cardiac monitoring in place. BG upon arrival to ED was 66. 8oz Orange juice was provided. Encouraged PO intake of OJ and peanut butter and crackers. Will continue to closely monitor.

## 2015-08-29 NOTE — ED Notes (Signed)
Pt eating crackers and peanut butter. Encouraging PO intake.

## 2015-08-29 NOTE — ED Notes (Signed)
Called for pt ride as per pt request.

## 2015-08-29 NOTE — ED Notes (Signed)
Repeat BG 80

## 2015-08-29 NOTE — ED Notes (Signed)
Contacted nutrition for meal tray.

## 2015-08-29 NOTE — ED Notes (Signed)
Emergency Department Nursing Plan of Care       The Nursing Plan of Care is developed from the Nursing assessment and Emergency Department Attending provider initial evaluation.  The plan of care may be reviewed in the ???ED Provider note???.    The Plan of Care was developed with the following considerations:   Patient / Family readiness to learn indicated [id number]:verbalized understanding  Persons(s) to be included in education: patient  Barriers to Learning/Limitations:No    Signed     Carolynn Commentrica N Gartman, RN    08/29/2015   6:34 AM

## 2015-08-29 NOTE — ED Notes (Signed)
Pt eating at this time.

## 2015-08-30 NOTE — Progress Notes (Signed)
Attempted to contact patient to  follow up unable to reach patient.This Clinical research associatewriter left message on voicemail with contact information.  [facility]/15 for hypoglycemia.

## 2015-08-30 NOTE — Progress Notes (Signed)
Spoke with patient after verifying name and DOB    DM2- 08/30/15 FBS 108 .  Patient seen for hypoglycemia yesterday [facility]. Denies any more episodes of hypoglycemia. Will discuss with PCP regarding current medication is Metformin and Amaryl. Hgb A1c 5.4 on 06/19/15. Patient declines to schedule appointment with PCP at this time. Will continue to follow.     CHF- 08/30/15 patient stated weight is 301 pounds. Denies SOB, Chest pain. Patient stated cardiology follow up scheduled for 09/05/15.      1030 Discussed case with Dr. Raiford Simmondsomah.  Verbal order received to discontinue Amaryl, monitor blood sugar, and have patient schedule follow up appointment. Verbal orders read back and verified by Dr. Raiford Simmondsomah for accuracy.    [id number] Spoke with patient after verifying name and DOB. Patient advised per Dr. Raiford Simmondsomah to stop Amaryl, monitor blood sugar (fasting /2 hours after eating) and schedule follow up appointment. Patient agrees to schedule after cardiology Patient given an opportunity to ask questions, repeated information, and verbalized understanding.

## 2015-08-31 ENCOUNTER — Encounter: Primary: Internal Medicine

## 2015-09-03 ENCOUNTER — Encounter: Attending: Cardiovascular Disease | Primary: Internal Medicine

## 2015-09-04 NOTE — Progress Notes (Signed)
Attempted to reach patient to follow up on DM and CHF. This Clinical research associatewriter contact information left on voicemail.

## 2015-09-05 ENCOUNTER — Ambulatory Visit
Admit: 2015-09-05 | Discharge: 2015-09-05 | Payer: MEDICARE | Attending: Cardiovascular Disease | Primary: Internal Medicine

## 2015-09-05 DIAGNOSIS — I10 Essential (primary) hypertension: Secondary | ICD-10-CM

## 2015-09-05 NOTE — Progress Notes (Signed)
NEW PATIENT. C/O SWELLING IN BLE, SOB ON 4 LTS OF O2.

## 2015-09-05 NOTE — Progress Notes (Signed)
This Clinical research associatewriter received message from patient requesting callback. Patient informed this Clinical research associatewriter she attended cardiology appointment and she is scheduled for stress test on 09/13/15 and 09/14/15.  She is feeling good. Denies dizziness. Patient has received COPD education this Clinical research associatewriter mailed and is currently reviewing. Patient will call with questions and update after stress test. Patient blood pressure was 184/88 at appointment and she will monitor at home and call this writer if it remains elevated. Patient stated new doctor's make her nervous.

## 2015-09-05 NOTE — Progress Notes (Signed)
NAME:  Brittney Shelton   DOB:   August 24, 1950   MRN:   [id number]   PCP:  Brittney RasherNau D Domah, MD    ??????    Subjective:     The patient is a 65 y.o. year old female  who presents for a new patient evalation for the following:   Patient reports dyspnea with exertion, on home O2.  She denies any chest pain, pressure, tightness or frequent indigestion. Has experienced a few hypoglycemic issues.  Moved to [place] a year ago and feels that her breathing is improving with some regular walking. Home BPs run 140s/80s on wrist monitor. Admits to white coat HTN.   Brittney Shelton has hx of chronic respiratory failure, Acute diastolic heart failure, COPD exacerbation and elevated blood pressure.     Past Medical History:   Diagnosis Date   ??? Chronic obstructive pulmonary disease (HCC)    ??? Congestive heart failure (HCC)    ??? Diabetes (HCC)    ??? Hypertension        Social History   Substance Use Topics   ??? Smoking status: Never Smoker   ??? Smokeless tobacco: Never Used   ??? Alcohol use No      Family History   Problem Relation Age of Onset   ??? Diabetes Mother    ??? Arthritis-osteo Father    ??? COPD Sister    ??? Diabetes Brother    ??? Diabetes Brother         Review of Systems  Constitutional: Negative for fever, chills, and diaphoresis.   Respiratory: Negative for cough, hemoptysis, sputum production, Reports shortness of breath   Cardiovascular: Negative for chest pain, palpitations, orthopnea, claudication, leg swelling and PND.   Gastrointestinal: Negative for heartburn, nausea, vomiting, blood in stool and melena.   Genitourinary: Negative for dysuria and flank pain.   Musculoskeletal: Negative for joint pain and back pain.   Skin: Negative for rash.   Neurological: Negative for focal weakness, seizures, loss of consciousness, weakness and headaches.   Endo/Heme/Allergies: Does not bruise/bleed easily.   Psychiatric/Behavioral: Negative for memory loss. The patient does not have insomnia.        Objective:       Vitals:    09/05/15 1029    BP: 184/88   Pulse: 78   Resp: 20   SpO2: (!) 86%   Weight: 302 lb 6.4 oz (137.2 kg)   Height: 5\' 6"  (1.676 m)    Body mass index is 48.81 kg/(m^2).      General??PE    Gen: NAD     Mental Status - Alert. General Appearance - Not in acute distress.     Neck - no JVD     Chest and Lung Exam     Inspection: Accessory muscles - No use of accessory muscles in breathing.   Auscultation:   Breath sounds: - Normal.     Cardiovascular   Inspection: Jugular vein - Bilateral - Inspection Normal.   Palpation/Percussion:   Apical Impulse: - Normal.   Auscultation: Rhythm - Regular. Heart Sounds - S1 WNL and S2 WNL. No S3 or S4.   Murmurs & Other Heart Sounds: Auscultation of the heart reveals - No Murmurs.     Peripheral Vascular   Upper Extremity: Inspection - Bilateral - No Cyanotic nailbeds or Digital clubbing.   Lower Extremity:   Palpation: Edema - Bilateral - No edema.     Abdomen: Soft, non-tender, bowel sounds are active.  Neuro: A&O times 3, CN and motor grossly WNL      Data Review:     EKG -  Sinus  Rhythm   -Left atrial enlargement.       LABS-   Lab Results   Component Value Date/Time    Cholesterol, total 179 06/19/2015 10:17 AM    HDL Cholesterol 46 06/19/2015 10:17 AM    LDL, calculated 114 06/19/2015 10:17 AM    VLDL, calculated 19 06/19/2015 10:17 AM    Triglyceride 96 06/19/2015 10:17 AM     Echo-  Left ventricle: Systolic function was normal. Ejection fraction was  estimated in the range of 60 % to 65 %. There were no regional wall motion  abnormalities. Wall thickness was mildly increased. Doppler parameters  were consistent with abnormal left ventricular relaxation (grade 1  diastolic dysfunction).    Aortic valve: There was mild stenosis.      Allergies reviewed  No Known Allergies    Medications reviewed  Current Outpatient Prescriptions   Medication Sig   ??? amLODIPine (NORVASC) 10 mg tablet TAKE 1 TABLET BY MOUTH DAILY   ??? albuterol-ipratropium (DUO-NEB) 2.5 mg-0.5 mg/3 ml nebu Take 3 mL by  inhalation three (3) times daily.   ??? metFORMIN (GLUCOPHAGE) 500 mg tablet Take 1 Tab by mouth two (2) times daily (with meals). Indications: type 2 diabetes mellitus (Patient taking differently: Take 1 Tab by mouth two (2) times daily (with meals). take 1/2 tab in the AM and PM  Indications: type 2 diabetes mellitus)   ??? predniSONE (DELTASONE) 20 mg tablet Take 2 Tabs by mouth daily.   ??? albuterol (PROVENTIL HFA, VENTOLIN HFA, PROAIR HFA) 90 mcg/actuation inhaler Take 1 Puff by inhalation every six (6) hours as needed for Wheezing.   ??? furosemide (LASIX) 40 mg tablet Take 1 tablet two times a day.  Indications: Edema   ??? metoprolol succinate (TOPROL-XL) 200 mg XL tablet TAKE 1 TABLET BY MOUTH DAILY FOR HIGH BLOOD PRESSURE   ??? lisinopril (PRINIVIL, ZESTRIL) 40 mg tablet TAKE 1 TABLET BY MOUTH ONCE DAILY   ??? isosorbide dinitrate (ISORDIL) 30 mg tablet TAKE 1 TABLET BY MOUTH THREE TIMES DAILY   ??? glimepiride (AMARYL) 4 mg tablet TAKE 1 TABLET BY MOUTH ONCE EVERY MORNING   ??? cloNIDine HCl (CATAPRES) 0.1 mg tablet TAKE 1 TABLET BY MOUTH ONCE DAILY   ??? pravastatin (PRAVACHOL) 20 mg tablet Take 1 Tab by mouth nightly.   ??? aspirin 81 mg chewable tablet Take 81 mg by mouth daily.   ??? calcium carbonate (OS-CAL) 500 mg calcium (1,250 mg) tablet Take  by mouth daily.   ??? Oxygen Take 4 L by inhalation continuous.     No current facility-administered medications for this visit.          Assessment:       ICD-10-CM ICD-9-CM    1. Hypertension, uncontrolled I10 401.9 AMB POC EKG ROUTINE W/ 12 LEADS, INTER & REP      STRESS TEST LEXISCAN/CARDIOLITE   2. Type 2 diabetes mellitus with hypoglycemia and coma, without long-term current use of insulin (HCC) E11.641 250.30 STRESS TEST LEXISCAN/CARDIOLITE   3. Obesity, unspecified obesity severity, unspecified obesity type E66.9 278.00 STRESS TEST LEXISCAN/CARDIOLITE   4. Dyspnea, unspecified type R06.00 786.09 STRESS TEST LEXISCAN/CARDIOLITE    5. Edema, unspecified type R60.9 782.3 STRESS TEST LEXISCAN/CARDIOLITE        Orders Placed This Encounter   ??? AMB POC EKG ROUTINE W/ 12 LEADS, INTER &  REP     Order Specific Question:   Reason for Exam:     Answer:   ROUTINE   ??? LEXISCAN/CARDIOLITE, Clinic Performed     Standing Status:   Future     Standing Expiration Date:   03/06/2016     Order Specific Question:   Reason for Exam:     Answer:   Dyspnea, BMI 48, HTN, COPD       Patient Active Problem List   Diagnosis Code   ??? Hypertension, uncontrolled I10   ??? Well controlled type 2 diabetes mellitus (HCC) E11.9   ??? Hypercholesterolemia E78.00   ??? 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 Respiratory failure (HCC) J96.90   ??? Hypoglycemia E16.2   ??? COPD (chronic obstructive pulmonary disease) (HCC) J44.9   ??? Obesity E66.9   ??? Type 2 diabetes mellitus with hypoglycemia (HCC) E11.649   ??? Acute infective exacerbation of chronic obstructive airway disease (HCC) J44.1   ??? Requires continuous at home supplemental oxygen Z99.81   ??? Dependence on supplemental oxygen Z99.81   ??? ACP (advance care planning) Z71.89   ??? Counseling regarding advanced care planning and goals of care Z71.89   ??? Dyspnea R06.00       Plan:     Patient presents for new patient evaluation for progressive dyspnea with hx of DM, COPD on  Home O2. She has a normal EF per echo 2/17. Will evaluate for ischemia with lexiscan.  Follow up in 6 mo if normal.       Victory Dakin, MD

## 2015-09-11 NOTE — Progress Notes (Signed)
Heart Failure liaison contacted the patient by telephone to perform CHF bundle Follow Up.  Verified DOB and address as identifiers. ??  ??  Spoke to Brittney SchwabMary Kahan regarding the following:    Have you been to an ER/Hospital since discharge [facility]?       Have you followed up with PCP? yes; Dr.Domah    Transportation:  No   Daily Weight: no change (increase or decrease)  Zone: Green  Signs/Symptoms: Swelling no; SOB no; how many pillows to sleep? 2  Diet: good   Exercise: walk to drive way two to three times a day    Medications Reconciled at this time:  yes  Home health?: no    Patient reminded that there is a cardiologist on call 24 hours a day / 7 days a week (M-F 5pm to 8am and from Friday 5pm until Monday 8a for the weekend) should the patient have questions or concerns.  Patient reminded to call 911 if situation is emergent or patient feels the situation is emergent. Provided pt with [facility] office telephone number, [phone].    Pt has follow up with PCP on 09/05/15 and with cardiology on 09/13/15 and 09/14/15

## 2015-09-13 ENCOUNTER — Institutional Professional Consult (permissible substitution): Admit: 2015-09-13 | Discharge: 2015-09-13 | Payer: MEDICARE | Primary: Internal Medicine

## 2015-09-13 DIAGNOSIS — R0600 Dyspnea, unspecified: Secondary | ICD-10-CM

## 2015-09-14 ENCOUNTER — Institutional Professional Consult (permissible substitution): Admit: 2015-09-14 | Discharge: 2015-09-14 | Payer: MEDICARE | Primary: Internal Medicine

## 2015-09-14 DIAGNOSIS — I10 Essential (primary) hypertension: Secondary | ICD-10-CM

## 2015-09-14 NOTE — Progress Notes (Signed)
Per review of patient chart she attended stress test appointments and was contacted by cardiology this week. This Clinical research associatewriter will contact patient next week

## 2015-09-14 NOTE — Progress Notes (Signed)
The patient was identified by name and date of birth. The risks and side effects were explained to the patient and questions were answered prior to testing. Myoview stress test was completed.

## 2015-09-16 NOTE — Progress Notes (Signed)
Stress test abnormal. F/u with Dr. Kirtland BouchardK after echo to discuss. thx.

## 2015-09-17 MED ORDER — REGADENOSON 0.4 MG/5 ML IV SYRINGE
0.4 mg/5 mL | Freq: Once | INTRAVENOUS | 0 refills | Status: AC
Start: 2015-09-17 — End: 2015-09-17

## 2015-09-17 MED ORDER — KIT FOR THE PREPARATION OF TC-99M-TETROFOSMIN 0.23 MG IV SOLUTION
0.23 mg | Freq: Once | INTRAVENOUS | 0 refills | Status: AC
Start: 2015-09-17 — End: 2015-09-17

## 2015-09-17 NOTE — Telephone Encounter (Signed)
-----   Message from [place] Beckie Saltsavindra V, MD sent at 09/16/2015  6:40 PM EDT -----  Stress test abnormal. F/u with Dr. Kirtland BouchardK after echo to discuss. thx.

## 2015-09-17 NOTE — Telephone Encounter (Signed)
Verified patient with two identifiers.  Spoke with patient regarding stress test results.  Pt is to see Dr. Orvil FeilKaminsky when ECHO is complete.

## 2015-09-18 ENCOUNTER — Encounter

## 2015-09-19 ENCOUNTER — Encounter: Attending: Internal Medicine | Primary: Internal Medicine

## 2015-09-25 ENCOUNTER — Institutional Professional Consult (permissible substitution): Admit: 2015-09-25 | Discharge: 2015-09-25 | Payer: MEDICARE | Primary: Internal Medicine

## 2015-09-25 DIAGNOSIS — J441 Chronic obstructive pulmonary disease with (acute) exacerbation: Secondary | ICD-10-CM

## 2015-09-25 NOTE — Progress Notes (Signed)
The procedure was explained to the patient, the patient expresses understanding of what will happen, and agrees to the procedure.

## 2015-09-26 NOTE — Progress Notes (Signed)
Spoke with patient after verifying name and DOB. Patient informed this Clinical research associatewriter "I am feeling great". Patient completed stress testing and echo with Dr. Orvil FeilKaminsky. She still is walking driveway, weather permitting, daily. Today's weight is 299 pounds. She has discontinued Amaryl as directed by Dr. Raiford Simmondsomah (med list updated to reflect) and denies any hyperglycemic episodes.  Patient is speaking in complete sentences and sounds very upbeat.

## 2015-09-26 NOTE — Progress Notes (Signed)
Her EF is normal,   Aortic valve: There was mild stenosis.  She should follow up with K re: stress test.

## 2015-09-27 ENCOUNTER — Encounter

## 2015-09-27 MED ORDER — PRAVASTATIN 20 MG TAB
20 mg | ORAL_TABLET | ORAL | 5 refills | Status: DC
Start: 2015-09-27 — End: 2015-11-24

## 2015-09-27 NOTE — Telephone Encounter (Signed)
Notes Recorded by Ethlyn GalleryLisa R Chameka Mcmullen, LPN on 1/61/09604/27/2017 at 2:57 PM  Verified patient with two identifiers. ??Patient aware Her EF is normal, Aortic valve: There was mild stenosis. She should follow up with K re: stress test. ??Informed her will have PSR call to schedule. ??She verbalized understanding.

## 2015-09-27 NOTE — Progress Notes (Signed)
Spoke to patient in regards to weekly follow up.  Patient states she just returned from the dollar store.  Patient denies any shortness of breath, nor swelling in her hands, hands, feet, or ankles.  Patient states her weight today is 300 lb.  Patient states she is feeling fine and has been getting out of the house since the weather has been nice, likes to walk up her drive way to get her mail.    Advised patient of the 24/7 number and she verbalized understanding.  Advised of her next appointment with her PCP in June and currently does not have a follow up with Cardiology.  Last note states follow up in 6 months which will be October.  Patient will call in the next few weeks for a follow up.

## 2015-09-27 NOTE — Telephone Encounter (Signed)
-----   Message from Epimenio FootAmy H Wenzel, ANP sent at 09/26/2015  1:32 PM EDT -----  Her EF is normal,   Aortic valve: There was mild stenosis.  She should follow up with K re: stress test.

## 2015-09-27 NOTE — Progress Notes (Signed)
Verified patient with two identifiers.  Patient aware Her EF is normal, Aortic valve: There was mild stenosis. She should follow up with K re: stress test.  Informed her will have PSR call to schedule.  She verbalized understanding.

## 2015-09-28 NOTE — Telephone Encounter (Signed)
Appt 10/10/2015

## 2015-10-04 NOTE — Progress Notes (Signed)
Heart Failure liaison contacted the patient by telephone to perform CHF bundle Follow Up.  Verified DOB and address as identifiers. ??  ??  Spoke to patient regarding the following:    Have you been to an ER/Hospital since [facility]?       Have you followed up with PCP? Yes Dr Raiford Simmondsomah;     Transportation:  brother  Daily Weight: 297.0lb decrease   Zone: green  Signs/Symptoms: Swelling denies; SOB denies; how many pillows to sleep? one  Diet: low sodium  Exercise: patient walks and also sits on the side of her bed and does exercises given to her from PT.   Patient-centered Goal to increase her walking daily  Medications Reconciled at this time:  no  Home health?: no    Patient reminded that there is a cardiologist on call 24 hours a day / 7 days a week (M-F 5pm to 8am and from Friday 5pm until Monday 8a for the weekend) should the patient have questions or concerns.  Patient reminded to call 911 if situation is emergent or patient feels the situation is emergent. Provided pt with [facility] office telephone number, [phone].    Pt has follow up with PCP on 11/07/15 and with cardiology on 10/10/15.

## 2015-10-10 ENCOUNTER — Ambulatory Visit
Admit: 2015-10-10 | Discharge: 2015-10-10 | Payer: MEDICARE | Attending: Cardiovascular Disease | Primary: Internal Medicine

## 2015-10-10 DIAGNOSIS — E7800 Pure hypercholesterolemia, unspecified: Secondary | ICD-10-CM

## 2015-10-10 NOTE — Progress Notes (Signed)
Chief Complaint   Patient presents with   ??? Results     echo and stress test

## 2015-10-10 NOTE — Progress Notes (Signed)
Spoke with patient after verifying name and DOB. Patient informs this Clinical research associatewriter she is "doing great". Had follow up appointment with cardiologist today and all testing came back all right and she does not need to been seen again until November. She continues to walk, weight is stable, speaking in full sentences, no hypoglycemic episodes since discontinuing Amaryl. Patient has PCP follow up on 11/07/15.

## 2015-10-10 NOTE — Progress Notes (Signed)
Subjective/HPI:     Brittney Shelton is a 65 y.o. female is here for f/u appt. She reports she has continued to walk and is feeling better since routinely exercising. Her breathing has improved overall.  The patient denies chest pain, orthopnea, PND, LE edema, palpitations, syncope, presyncope or fatigue.         PCP Provider  Ermalene Postin, MD  Past Medical History:   Diagnosis Date   ??? Chronic obstructive pulmonary disease ([place])    ??? Congestive heart failure ([place])    ??? Diabetes ([place])    ??? Hypertension       No past surgical history on file.  No Known Allergies   Family History   Problem Relation Age of Onset   ??? Diabetes Mother    ??? Arthritis-osteo Father    ??? COPD Sister    ??? Diabetes Brother    ??? Diabetes Brother       Current Outpatient Prescriptions   Medication Sig   ??? glimepiride (AMARYL) 4 mg tablet TK 1 T PO ONCE D QAM   ??? pravastatin (PRAVACHOL) 20 mg tablet TAKE 1 TABLET BY MOUTH ONCE DAILY NIGHLTY   ??? amLODIPine (NORVASC) 10 mg tablet TAKE 1 TABLET BY MOUTH DAILY   ??? albuterol-ipratropium (DUO-NEB) 2.5 mg-0.5 mg/3 ml nebu Take 3 mL by inhalation three (3) times daily.   ??? metFORMIN (GLUCOPHAGE) 500 mg tablet Take 1 Tab by mouth two (2) times daily (with meals). Indications: type 2 diabetes mellitus (Patient taking differently: Take 1 Tab by mouth two (2) times daily (with meals). take 1/2 tab in the AM and PM  Indications: type 2 diabetes mellitus)   ??? albuterol (PROVENTIL HFA, VENTOLIN HFA, PROAIR HFA) 90 mcg/actuation inhaler Take 1 Puff by inhalation every six (6) hours as needed for Wheezing.   ??? furosemide (LASIX) 40 mg tablet Take 1 tablet two times a day.  Indications: Edema   ??? metoprolol succinate (TOPROL-XL) 200 mg XL tablet TAKE 1 TABLET BY MOUTH DAILY FOR HIGH BLOOD PRESSURE   ??? lisinopril (PRINIVIL, ZESTRIL) 40 mg tablet TAKE 1 TABLET BY MOUTH ONCE DAILY   ??? isosorbide dinitrate (ISORDIL) 30 mg tablet TAKE 1 TABLET BY MOUTH THREE TIMES DAILY    ??? cloNIDine HCl (CATAPRES) 0.1 mg tablet TAKE 1 TABLET BY MOUTH ONCE DAILY   ??? aspirin 81 mg chewable tablet Take 81 mg by mouth daily.   ??? calcium carbonate (OS-CAL) 500 mg calcium (1,250 mg) tablet Take  by mouth daily.   ??? Oxygen Take 4 L by inhalation continuous.     No current facility-administered medications for this visit.       Vitals:    10/10/15 0947 10/10/15 1009   BP: 148/88 142/84   Pulse: 68    Resp: 24    SpO2: 92%    Weight: 302 lb 9.6 oz (137.3 kg)    Height: 5' 6"  (1.676 m)      Social History     Social History   ??? Marital status: SINGLE     Spouse name: N/A   ??? Number of children: N/A   ??? Years of education: N/A     Occupational History   ??? Not on file.     Social History Main Topics   ??? Smoking status: Never Smoker   ??? Smokeless tobacco: Never Used   ??? Alcohol use No   ??? Drug use: No   ??? Sexual activity: No     Other  Topics Concern   ??? Not on file     Social History Narrative       I have reviewed the nurses notes, vitals, problem list, allergy list, medical history, family, social history and medications.    Review of Symptoms:    General: Pt denies excessive weight gain or loss. Pt is able to conduct ADL's  HEENT: Denies blurred vision, headaches, epistaxis and difficulty swallowing.  Respiratory: Denies worsening shortness of breath, DOE, wheezing or stridor.  Cardiovascular: Denies precordial pain, palpitations, edema or PND  Gastrointestinal: Denies poor appetite, indigestion, abdominal pain or blood in stool  Urinary: Denies dysuria, pyuria  Musculoskeletal: Denies pain or swelling from muscles or joints  Neurologic: Denies tremor, paresthesias, or sensory motor disturbance  Skin: Denies rash, itching or texture change.  Psych: Denies depression        Physical Exam: ??    General: Well developed, in no acute distress, cooperative and alert  HEENT: No carotid bruits, no JVD, trach is midline. Neck Supple, PEERL, EOM intact.   Heart: ??Normal S1/S2 negative S3 or S4. Regular, no murmur, gallop or rub.??  Respiratory: Diminished BS posterior bases, otherwise clear, no wheezing or rales.O2 via NC  Abdomen:?? ??Soft, non-tender, no masses, bowel sounds are active.??  Extremities:  Trace bil LE edema, normal cap refill, no cyanosis, atraumatic.   Neuro: A&Ox3, speech clear, gait stable.   Skin: Skin color is normal. No rashes or lesions. Non diaphoretic  Vascular: 2+ pulses symmetric in all extremities    Cardiographics  Echo-Left ventricle: Systolic function was normal. Ejection fraction was  estimated in the range of 60 % to 65 %. There were no regional wall motion  abnormalities. Wall thickness was mildly increased  Aortic valve: There was mild stenosis.  Tricuspid valve: Pulmonary artery systolic pressure: 39 mmHg. There was  mild pulmonary hypertension.  Summary Measurements  CW measurements:  Aortic Valve: ?? AV MaxPG was 14.7 mmHg. ??AV Vmax was 1.9 m/s. ??AV meanPG  was 7.8 mmHg.  PW measurements:  Aortic Valve: ?? AVA (VTI) was 1.8 cm2.    Lexiscan stress test-The overall quality of the study is excellent. Attenuation artifact was Present . Left ventricular cavity is noted to be mildly dilated on the rest and stress studies. Additionally, the right ventricle is normal.     SPECT images demonstrate homogeneous tracer distrubution throughout the myocardium on the stress and rest images. Gated SPECT images demonstrates hypokinesis of the anterior, septal and apical region. The left ventricular ejection fraction was calculated to be 33 %.    Impression:   Myocardial perfusion imaging is normal. Overall left ventricular systolic function was abnormal: with regional wall motion abnormalities (as noted above).      Results for orders placed or performed during the hospital encounter of 07/26/15   EKG, 12 LEAD, INITIAL   Result Value Ref Range    Ventricular Rate 63 BPM    Atrial Rate 63 BPM    P-R Interval 200 ms    QRS Duration 96 ms     Q-T Interval 462 ms    QTC Calculation (Bezet) 472 ms    Calculated P Axis 56 degrees    Calculated R Axis 4 degrees    Calculated T Axis 22 degrees    Diagnosis       Sinus rhythm with fusion complexes  No previous ECGs available  Confirmed by Ravindra, P.V. ([id number]) on 07/26/2015 10:34:28 AM  Cardiology Labs:  Lab Results   Component Value Date/Time    Cholesterol, total 179 06/19/2015 10:17 AM    HDL Cholesterol 46 06/19/2015 10:17 AM    LDL, calculated 114 06/19/2015 10:17 AM    Triglyceride 96 06/19/2015 10:17 AM       Lab Results   Component Value Date/Time    Sodium 140 07/28/2015 03:24 AM    Potassium 3.6 07/28/2015 03:24 AM    Chloride 98 07/28/2015 03:24 AM    CO2 31 07/28/2015 03:24 AM    Anion gap 11 07/28/2015 03:24 AM    Glucose 129 07/28/2015 03:24 AM    BUN 40 07/28/2015 03:24 AM    Creatinine 0.91 07/28/2015 03:24 AM    BUN/Creatinine ratio 44 07/28/2015 03:24 AM    GFR est AA >60 07/28/2015 03:24 AM    GFR est non-AA >60 07/28/2015 03:24 AM    Calcium 8.0 07/28/2015 03:24 AM    AST (SGOT) 14 07/26/2015 03:59 AM    Alk. phosphatase 70 07/26/2015 03:59 AM    Protein, total 8.0 07/26/2015 03:59 AM    Albumin 3.4 07/26/2015 03:59 AM    Globulin 4.6 07/26/2015 03:59 AM    A-G Ratio 0.7 07/26/2015 03:59 AM    ALT (SGPT) 11 07/26/2015 03:59 AM           Assessment:     Assessment:     Brittney Shelton was seen today for results.    Diagnoses and all orders for this visit:    Hypercholesterolemia  -     METABOLIC PANEL, BASIC  -     MAGNESIUM    Chronic obstructive pulmonary disease, unspecified COPD type ([place])  -     METABOLIC PANEL, BASIC  -     MAGNESIUM    Type 2 diabetes mellitus with complication, unspecified long term insulin use status  -     METABOLIC PANEL, BASIC  -     MAGNESIUM    Chronic acquired lymphedema  -     METABOLIC PANEL, BASIC  -     MAGNESIUM    Essential hypertension  -     METABOLIC PANEL, BASIC  -     MAGNESIUM    Requires continuous at home supplemental oxygen   -     METABOLIC PANEL, BASIC  -     MAGNESIUM        ICD-10-CM ICD-9-CM    1. Hypercholesterolemia E78.00 272.0 glimepiride (AMARYL) 4 mg tablet      METABOLIC PANEL, BASIC      MAGNESIUM   2. Chronic obstructive pulmonary disease, unspecified COPD type (HCC) J44.9 496 glimepiride (AMARYL) 4 mg tablet      METABOLIC PANEL, BASIC      MAGNESIUM   3. Type 2 diabetes mellitus with complication, unspecified long term insulin use status E11.8 250.90 glimepiride (AMARYL) 4 mg tablet      METABOLIC PANEL, BASIC      MAGNESIUM   4. 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 Essential hypertension I10 401.9 glimepiride (AMARYL) 4 mg tablet      METABOLIC PANEL, BASIC      MAGNESIUM   6. Requires continuous at home supplemental oxygen Z99.81 V46.2 glimepiride (AMARYL) 4 mg tablet      METABOLIC PANEL, BASIC      MAGNESIUM     Orders Placed This Encounter   ???  METABOLIC PANEL, BASIC   ??? MAGNESIUM   ??? glimepiride (AMARYL) 4 mg tablet     Sig: TK 1 T PO ONCE D QAM     Refill:  5        Plan:     Patient presents today for f/u after cardiac testing. She reports that her DOE has improved with routine exercise. Her stress test was normal with the exception of a reduced EF. Echo was completed showing EF 60-65%, mild AS. BP is normotensive today. Repeat BMP and Mag. Weight trending up, advised increase her Lasix to 2 tabs this pm, 2 tabs in am if still above 300. Then return to her normal dosing. f/u in 6 months.    Dianne Dun, MD

## 2015-10-15 ENCOUNTER — Inpatient Hospital Stay: Admit: 2015-10-31 | Payer: MEDICARE | Primary: Internal Medicine

## 2015-10-15 DIAGNOSIS — E7800 Pure hypercholesterolemia, unspecified: Secondary | ICD-10-CM

## 2015-10-16 LAB — METABOLIC PANEL, BASIC
BUN/Creatinine ratio: 25 (ref 12–28)
BUN: 23 mg/dL (ref 8–27)
CO2: 30 mmol/L — ABNORMAL HIGH (ref 18–29)
Calcium: 9 mg/dL (ref 8.7–10.3)
Chloride: 94 mmol/L — ABNORMAL LOW (ref 96–106)
Creatinine: 0.93 mg/dL (ref 0.57–1.00)
GFR est AA: 74 mL/min/{1.73_m2} (ref >59)
GFR est non-AA: 64 mL/min/{1.73_m2} (ref >59)
Glucose: 133 mg/dL — ABNORMAL HIGH (ref 65–99)
Potassium: 4.2 mmol/L (ref 3.5–5.2)
Sodium: 141 mmol/L (ref 134–144)

## 2015-10-16 LAB — MAGNESIUM: Magnesium: 2 mg/dL (ref 1.6–2.3)

## 2015-10-16 NOTE — Telephone Encounter (Signed)
Notes Recorded by Ethlyn GalleryLisa R Lugene Hitt, LPN on 1/61/09605/16/2017 at 3:51 PM  Verified patient with two identifiers. ??Spoke with patient informed her kidney function is normal, electrolytes are normal. Continue with the diuretic as discussed. ??She verbalized understanding.

## 2015-10-16 NOTE — Telephone Encounter (Signed)
-----   Message from Elwanda BrooklynLori D Warren, NP sent at 10/16/2015  3:44 PM EDT -----  Please let pt know that her kidney function is normal, electrolytes are normal. Continue with the diuretic as discussed.

## 2015-10-16 NOTE — Progress Notes (Signed)
Please let pt know that her kidney function is normal, electrolytes are normal. Continue with the diuretic as discussed.

## 2015-10-16 NOTE — Progress Notes (Signed)
Verified patient with two identifiers.  Spoke with patient informed her kidney function is normal, electrolytes are normal. Continue with the diuretic as discussed.  She verbalized understanding.

## 2015-10-19 NOTE — Progress Notes (Signed)
Spoke with patient after verifying name and DOB. Patient informed this Clinical research associatewriter she is "feeling great and has no complaints." Her weight today is 300 pounds, blood sugar was 120, and blood pressure(wrist) 132/70. Patient has eye appointment on 10/24/15 with Dr. Lorrene Reidombs.  PCP follow up on 11/07/15 and cardio follow up 04/30/16.  Patient denies SOB and chest pain. Patient is speaking in complete sentences. Will follow up next week.

## 2015-10-26 NOTE — Progress Notes (Signed)
Attempted to contact patient to follow up. This Engineer, building serviceswriter contact information left on voicemail for patient to update this Clinical research associatewriter.

## 2015-11-07 ENCOUNTER — Ambulatory Visit: Admit: 2015-11-07 | Discharge: 2015-11-07 | Payer: MEDICARE | Attending: Internal Medicine | Primary: Internal Medicine

## 2015-11-07 ENCOUNTER — Inpatient Hospital Stay: Admit: 2015-11-13 | Payer: MEDICARE | Primary: Internal Medicine

## 2015-11-07 DIAGNOSIS — E119 Type 2 diabetes mellitus without complications: Secondary | ICD-10-CM

## 2015-11-07 LAB — AMB POC HEMOGLOBIN A1C: Hemoglobin A1c (POC): 6.1 %

## 2015-11-07 LAB — AMB POC GLUCOSE BLOOD, BY GLUCOSE MONITORING DEVICE: Glucose POC: 136 mg/dL

## 2015-11-07 NOTE — Patient Instructions (Addendum)
DASH Diet: Care Instructions  Your Care Instructions  The DASH diet is an eating plan that can help lower your blood pressure. DASH stands for Dietary Approaches to Stop Hypertension. Hypertension is high blood pressure.  The DASH diet focuses on eating foods that are high in calcium, potassium, and magnesium. These nutrients can lower blood pressure. The foods that are highest in these nutrients are fruits, vegetables, low-fat dairy products, nuts, seeds, and legumes. But taking calcium, potassium, and magnesium supplements instead of eating foods that are high in those nutrients does not have the same effect. The DASH diet also includes whole grains, fish, and poultry.  The DASH diet is one of several lifestyle changes your doctor may recommend to lower your high blood pressure. Your doctor may also want you to decrease the amount of sodium in your diet. Lowering sodium while following the DASH diet can lower blood pressure even further than just the DASH diet alone.  Follow-up care is a key part of your treatment and safety. Be sure to make and go to all appointments, and call your doctor if you are having problems. It's also a good idea to know your test results and keep a list of the medicines you take.  How can you care for yourself at home?  Following the DASH diet  ?? Eat 4 to 5 servings of fruit each day. A serving is 1 medium-sized piece of fruit, ?? cup chopped or canned fruit, 1/4 cup dried fruit, or 4 ounces (?? cup) of fruit juice. Choose fruit more often than fruit juice.  ?? Eat 4 to 5 servings of vegetables each day. A serving is 1 cup of lettuce or raw leafy vegetables, ?? cup of chopped or cooked vegetables, or 4 ounces (?? cup) of vegetable juice. Choose vegetables more often than vegetable juice.  ?? Get 2 to 3 servings of low-fat and fat-free dairy each day. A serving is 8 ounces of milk, 1 cup of yogurt, or 1 ?? ounces of cheese.   ?? Eat 6 to 8 servings of grains each day. A serving is 1 slice of bread, 1 ounce of dry cereal, or ?? cup of cooked rice, pasta, or cooked cereal. Try to choose whole-grain products as much as possible.  ?? Limit lean meat, poultry, and fish to 2 servings each day. A serving is 3 ounces, about the size of a deck of cards.  ?? Eat 4 to 5 servings of nuts, seeds, and legumes (cooked dried beans, lentils, and split peas) each week. A serving is 1/3 cup of nuts, 2 tablespoons of seeds, or ?? cup of cooked beans or peas.  ?? Limit fats and oils to 2 to 3 servings each day. A serving is 1 teaspoon of vegetable oil or 2 tablespoons of salad dressing.  ?? Limit sweets and added sugars to 5 servings or less a week. A serving is 1 tablespoon jelly or jam, ?? cup sorbet, or 1 cup of lemonade.  ?? Eat less than 2,300 milligrams (mg) of sodium a day. If you limit your sodium to 1,500 mg a day, you can lower your blood pressure even more.  Tips for success  ?? Start small. Do not try to make dramatic changes to your diet all at once. You might feel that you are missing out on your favorite foods and then be more likely to not follow the plan. Make small changes, and stick with them. Once those changes become habit, add a few more   changes.  ?? Try some of the following:  ?? Make it a goal to eat a fruit or vegetable at every meal and at snacks. This will make it easy to get the recommended amount of fruits and vegetables each day.  ?? Try yogurt topped with fruit and nuts for a snack or healthy dessert.  ?? Add lettuce, tomato, cucumber, and onion to sandwiches.  ?? Combine a ready-made pizza crust with low-fat mozzarella cheese and lots of vegetable toppings. Try using tomatoes, squash, spinach, broccoli, carrots, cauliflower, and onions.  ?? Have a variety of cut-up vegetables with a low-fat dip as an appetizer instead of chips and dip.  ?? Sprinkle sunflower seeds or chopped almonds over salads. Or try adding  chopped walnuts or almonds to cooked vegetables.  ?? Try some vegetarian meals using beans and peas. Add garbanzo or kidney beans to salads. Make burritos and tacos with mashed pinto beans or black beans.  Where can you learn more?  Go to http://www.healthwise.net/GoodHelpConnections.  Enter H967 in the search box to learn more about "DASH Diet: Care Instructions."  Current as of: August 23, 2014  Content Version: 11.2  ?? 2006-2017 [employer]. Care instructions adapted under license by [employer] (which disclaims liability or warranty for this information). If you have questions about a medical condition or this instruction, always ask your healthcare professional. [employer] Inc[employer] disclaims any warranty or liability for your use of this information.

## 2015-11-07 NOTE — Progress Notes (Signed)
Chief Complaint   Patient presents with   ??? Diabetes     4 month f/u     Hypertension and hypercholesterolemia  Brittney Shelton is a 65 y.o. AA female presents for follow up. Patient has hypertension and hyperlipidemia.   She has been doing good follows low fat low cholesterol, low sodium diet. Patient is taking medications as instructed, no medication side effects noted. Blood pressure is elevated in clinic today. Reason: she roughed out to this clinic visit and forgot to take medication.       Diabetes Mellitus:  Patient also has diabetes type 2 that is well controlled on metformin A1C=6.1%, BS=136 today    COPD Review  patient also has COPD, currently without exacerbation, on 4L home oxygen therapy, saturation is very low  Symptoms: chronic dyspnea which is stable. Patient has a see Pulmonologist after this visit.    Review of Systems  As per hpi    Past Medical History:   Diagnosis Date   ??? Chronic obstructive pulmonary disease (HCC)    ??? Congestive heart failure (HCC)    ??? Diabetes (HCC)    ??? Hypertension      History reviewed. No pertinent surgical history.     Social History   ??? Marital status: SINGLE     Spouse name: N/A   ??? Number of children: N/A   ??? Years of education: N/A     Social History Main Topics   ??? Smoking status: Never Smoker   ??? Smokeless tobacco: Never Used   ??? Alcohol use No   ??? Drug use: No   ??? Sexual activity: No     Family History   Problem Relation Age of Onset   ??? Diabetes Mother    ??? Arthritis-osteo Father    ??? COPD Sister    ??? Diabetes Brother    ??? Diabetes Brother      Current Outpatient Prescriptions   Medication Sig Dispense Refill   ??? pravastatin (PRAVACHOL) 20 mg tablet TAKE 1 TABLET BY MOUTH ONCE DAILY NIGHLTY 30 Tab 5   ??? amLODIPine (NORVASC) 10 mg tablet TAKE 1 TABLET BY MOUTH DAILY 90 Tab 3   ??? albuterol-ipratropium (DUO-NEB) 2.5 mg-0.5 mg/3 ml nebu Take 3 mL by inhalation three (3) times daily.     ??? metFORMIN (GLUCOPHAGE) 500 mg tablet Take 1 Tab by mouth two (2) times  daily (with meals). Indications: type 2 diabetes mellitus (Patient taking differently: Take 1 Tab by mouth two (2) times daily (with meals). take 1/2 tab in the AM and PM  Indications: type 2 diabetes mellitus) 60 Tab 5   ??? albuterol (PROVENTIL HFA, VENTOLIN HFA, PROAIR HFA) 90 mcg/actuation inhaler Take 1 Puff by inhalation every six (6) hours as needed for Wheezing. 1 Inhaler 0   ??? furosemide (LASIX) 40 mg tablet Take 1 tablet two times a day.  Indications: Edema 180 Tab 1   ??? metoprolol succinate (TOPROL-XL) 200 mg XL tablet TAKE 1 TABLET BY MOUTH DAILY FOR HIGH BLOOD PRESSURE 90 Tab 5   ??? lisinopril (PRINIVIL, ZESTRIL) 40 mg tablet TAKE 1 TABLET BY MOUTH ONCE DAILY 90 Tab 5   ??? isosorbide dinitrate (ISORDIL) 30 mg tablet TAKE 1 TABLET BY MOUTH THREE TIMES DAILY 270 Tab 5   ??? cloNIDine HCl (CATAPRES) 0.1 mg tablet TAKE 1 TABLET BY MOUTH ONCE DAILY 90 Tab 5   ??? aspirin 81 mg chewable tablet Take 81 mg by mouth daily.     ???  calcium carbonate (OS-CAL) 500 mg calcium (1,250 mg) tablet Take  by mouth daily.     ??? Oxygen Take 4 L by inhalation continuous.       No Known Allergies    Objective:  Visit Vitals   ??? BP 174/79 (BP 1 Location: Left arm, BP Patient Position: Sitting)   ??? Pulse 73   ??? Temp 96.4 ??F (35.8 ??C) (Oral)   ??? Resp 24   ??? Ht 5\' 6"  (1.676 m)   ??? Wt 302 lb 6.4 oz (137.2 kg)   ??? SpO2 (!) 77%  Comment: 4liter   ??? BMI 48.81 kg/m2     Physical Exam:   General appearance - alert, oriented in no apperent distress  Mental status - alert, oriented to person, place, and time  EYE-PERRL, EOMI  ENT-ENT exam normal, no neck nodes or sinus tenderness  Neck - supple, no significant adenopathy   Chest - clear to auscultation, no wheezes, rales or rhonchi  Heart - normal rate, regular rhythm, normal blood pressure  Abdomen - obese,soft, nontender, nondistended, no organomegaly  Ext-peripheral pulses normal, no pedal edema  Neuro -alert, oriented, normal speech, no focal findings   Feet-no nail deformities or callus formation with good pulses noted    Results for orders placed or performed in visit on 11/07/15   AMB POC GLUCOSE BLOOD, BY GLUCOSE MONITORING DEVICE   Result Value Ref Range    Glucose POC 136 mg/dL   AMB POC HEMOGLOBIN [id number]   Result Value Ref Range    Hemoglobin A1c (POC) 6.1 %     Assessment/Plan:    ICD-10-CM ICD-9-CM    1. Well controlled type 2 diabetes mellitus (HCC) E11.9 250.00 AMB POC GLUCOSE BLOOD, BY GLUCOSE MONITORING DEVICE      AMB POC HEMOGLOBIN A1C      METABOLIC PANEL, COMPREHENSIVE   2. 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 Hypercholesterolemia E78.00 272.0 LIPID PANEL   4. Hypertension, uncontrolled I10 401.9 METABOLIC PANEL, COMPREHENSIVE   5. 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 On home oxygen therapy Z99.81 V46.2    7. Hypovitaminosis D E55.9 268.9 VITAMIN D, 25 HYDROXY   8. Obesity (BMI 30-39.9) E66.9 278.00 LIPID PANEL   9. Encounter for diabetic foot exam (HCC) E11.9 250.00 HM DIABETES FOOT EXAM     Patient Instructions        DASH Diet: Care Instructions  Your Care Instructions  The DASH diet is an eating plan that can help lower your blood pressure. DASH stands for Dietary Approaches to Stop Hypertension. Hypertension is high blood pressure.  The DASH diet focuses on eating foods that are high in calcium, potassium, and magnesium. These nutrients can lower blood pressure. The foods that are highest in these nutrients are fruits, vegetables, low-fat dairy products, nuts, seeds, and legumes. But taking calcium, potassium, and magnesium supplements instead of eating foods that are high in those nutrients does not have the same effect. The DASH diet also includes whole grains, fish, and poultry.  The DASH diet is one of several lifestyle changes your doctor may recommend to lower your high blood pressure. Your doctor may also want you to decrease the amount of sodium in your diet. Lowering sodium while  following the DASH diet can lower blood pressure even further than just the DASH diet alone.  Follow-up care is a key part of your treatment and safety. Be sure to make and go to  all appointments, and call your doctor if you are having problems. It's also a good idea to know your test results and keep a list of the medicines you take.  How can you care for yourself at home?  Following the DASH diet  ?? Eat 4 to 5 servings of fruit each day. A serving is 1 medium-sized piece of fruit, ?? cup chopped or canned fruit, 1/4 cup dried fruit, or 4 ounces (?? cup) of fruit juice. Choose fruit more often than fruit juice.  ?? Eat 4 to 5 servings of vegetables each day. A serving is 1 cup of lettuce or raw leafy vegetables, ?? cup of chopped or cooked vegetables, or 4 ounces (?? cup) of vegetable juice. Choose vegetables more often than vegetable juice.  ?? Get 2 to 3 servings of low-fat and fat-free dairy each day. A serving is 8 ounces of milk, 1 cup of yogurt, or 1 ?? ounces of cheese.  ?? Eat 6 to 8 servings of grains each day. A serving is 1 slice of bread, 1 ounce of dry cereal, or ?? cup of cooked rice, pasta, or cooked cereal. Try to choose whole-grain products as much as possible.  ?? Limit lean meat, poultry, and fish to 2 servings each day. A serving is 3 ounces, about the size of a deck of cards.  ?? Eat 4 to 5 servings of nuts, seeds, and legumes (cooked dried beans, lentils, and split peas) each week. A serving is 1/3 cup of nuts, 2 tablespoons of seeds, or ?? cup of cooked beans or peas.  ?? Limit fats and oils to 2 to 3 servings each day. A serving is 1 teaspoon of vegetable oil or 2 tablespoons of salad dressing.  ?? Limit sweets and added sugars to 5 servings or less a week. A serving is 1 tablespoon jelly or jam, ?? cup sorbet, or 1 cup of lemonade.  ?? Eat less than 2,300 milligrams (mg) of sodium a day. If you limit your sodium to 1,500 mg a day, you can lower your blood pressure even more.  Tips for success   ?? Start small. Do not try to make dramatic changes to your diet all at once. You might feel that you are missing out on your favorite foods and then be more likely to not follow the plan. Make small changes, and stick with them. Once those changes become habit, add a few more changes.  ?? Try some of the following:  ?? Make it a goal to eat a fruit or vegetable at every meal and at snacks. This will make it easy to get the recommended amount of fruits and vegetables each day.  ?? Try yogurt topped with fruit and nuts for a snack or healthy dessert.  ?? Add lettuce, tomato, cucumber, and onion to sandwiches.  ?? Combine a ready-made pizza crust with low-fat mozzarella cheese and lots of vegetable toppings. Try using tomatoes, squash, spinach, broccoli, carrots, cauliflower, and onions.  ?? Have a variety of cut-up vegetables with a low-fat dip as an appetizer instead of chips and dip.  ?? Sprinkle sunflower seeds or chopped almonds over salads. Or try adding chopped walnuts or almonds to cooked vegetables.  ?? Try some vegetarian meals using beans and peas. Add garbanzo or kidney beans to salads. Make burritos and tacos with mashed pinto beans or black beans.  Where can you learn more?  Go to [employer].  Enter [phone] in the search box to learn more about "  DASH Diet: Care Instructions."  Current as of: August 23, 2014  Content Version: 11.2  ?? 2006-2017 [employer]. Care instructions adapted under license by [employer] (which disclaims liability or warranty for this information). If you have questions about a medical condition or this instruction, always ask your healthcare professional. [employer] disclaims any warranty or liability for your use of this information.      Follow-up Disposition:  Return 2-3 weeks, for routine follow up.

## 2015-11-07 NOTE — Progress Notes (Signed)
Spoke with patient after verifying name and DOB. Patient informed this Clinical research associatewriter she got back from vacation on Friday, 11/02/15. She did not adhere to diet plan and weight was 306 after returning home. Weight today is 302 pounds. Patient BS was 140's this morning but it was taken after eating fruit. FBS from 11/06/15 was 130's. Patient denies any hypoglycemic episodes. Patient was taken off Amaryl due to hypoglycemic episodes by PCP. Patient Hgb A1C from 06/2015 was 5.4. She is due to have repeated today at office visit. Patient will speak with provider regarding increased FBS at office visit. Plan is for this writer to follow up with patient regarding any changes made at at office visit today. Verified patient still has this Clinical research associatewriter contact information and will call office with any questions and/or concerns.

## 2015-11-07 NOTE — Progress Notes (Signed)
1. Have you been to the ER, urgent care clinic since your last visit?  Hospitalized since your last visit?No    2. Have you seen or consulted any other health care providers outside of the [facility] since your last visit?  Include any pap smears or colon screening. No     Pt is here for follow up on diabetes

## 2015-11-08 LAB — METABOLIC PANEL, COMPREHENSIVE
A-G Ratio: 1 — ABNORMAL LOW (ref 1.2–2.2)
ALT (SGPT): 5 IU/L (ref 0–32)
AST (SGOT): 10 IU/L (ref 0–40)
Albumin: 4.1 g/dL (ref 3.6–4.8)
Alk. phosphatase: 77 IU/L (ref 39–117)
BUN/Creatinine ratio: 21 (ref 12–28)
BUN: 20 mg/dL (ref 8–27)
Bilirubin, total: 1 mg/dL (ref 0.0–1.2)
CO2: 29 mmol/L (ref 18–29)
Calcium: 9.1 mg/dL (ref 8.7–10.3)
Chloride: 93 mmol/L — ABNORMAL LOW (ref 96–106)
Creatinine: 0.95 mg/dL (ref 0.57–1.00)
GFR est AA: 72 mL/min/{1.73_m2} (ref >59)
GFR est non-AA: 63 mL/min/{1.73_m2} (ref >59)
GLOBULIN, TOTAL: 4 g/dL (ref 1.5–4.5)
Glucose: 133 mg/dL — ABNORMAL HIGH (ref 65–99)
Potassium: 4.1 mmol/L (ref 3.5–5.2)
Protein, total: 8.1 g/dL (ref 6.0–8.5)
Sodium: 142 mmol/L (ref 134–144)

## 2015-11-08 LAB — LIPID PANEL
Cholesterol, total: 206 mg/dL — ABNORMAL HIGH (ref 100–199)
HDL Cholesterol: 51 mg/dL (ref >39)
LDL, calculated: 135 mg/dL — ABNORMAL HIGH (ref 0–99)
Triglyceride: 102 mg/dL (ref 0–149)
VLDL, calculated: 20 mg/dL (ref 5–40)

## 2015-11-08 LAB — VITAMIN D, 25 HYDROXY: VITAMIN D, 25-HYDROXY: 17.8 ng/mL — ABNORMAL LOW (ref 30.0–100.0)

## 2015-11-24 ENCOUNTER — Encounter

## 2015-11-25 ENCOUNTER — Encounter

## 2015-11-26 MED ORDER — PRAVASTATIN 20 MG TAB
20 mg | ORAL_TABLET | ORAL | 5 refills | Status: DC
Start: 2015-11-26 — End: 2017-02-08

## 2015-11-26 MED ORDER — METFORMIN 500 MG TAB
500 mg | ORAL_TABLET | ORAL | 5 refills | Status: DC
Start: 2015-11-26 — End: 2016-01-23

## 2015-11-28 ENCOUNTER — Encounter: Attending: Internal Medicine | Primary: Internal Medicine

## 2015-12-11 ENCOUNTER — Inpatient Hospital Stay: Admit: 2016-01-07 | Payer: MEDICARE | Primary: Internal Medicine

## 2015-12-11 ENCOUNTER — Ambulatory Visit: Admit: 2015-12-11 | Discharge: 2015-12-11 | Payer: MEDICARE | Attending: Internal Medicine | Primary: Internal Medicine

## 2015-12-11 DIAGNOSIS — Z1159 Encounter for screening for other viral diseases: Secondary | ICD-10-CM

## 2015-12-11 DIAGNOSIS — E119 Type 2 diabetes mellitus without complications: Secondary | ICD-10-CM

## 2015-12-11 MED ORDER — CHOLECALCIFEROL (VITAMIN D3) 5,000 UNIT TABLET
ORAL_TABLET | Freq: Every day | ORAL | 5 refills | Status: DC
Start: 2015-12-11 — End: 2017-03-13

## 2015-12-11 MED ORDER — PNEUMOCOCCAL 13-VAL CONJ VACCINE-DIP CRM (PF) 0.5 ML IM SYRINGE
0.5 mL | Freq: Once | INTRAMUSCULAR | 0 refills | Status: AC
Start: 2015-12-11 — End: 2015-12-11

## 2015-12-11 NOTE — Progress Notes (Signed)
Chief Complaint   Patient presents with   ??? Hypertension     f/u   ??? Diabetes   ??? Cholesterol Problem

## 2015-12-11 NOTE — Patient Instructions (Signed)
Learning About Vitamin D  Why is it important to get enough vitamin D?  Your body needs vitamin D to absorb calcium. Calcium keeps your bones and muscles, including your heart, healthy and strong. If your muscles don't get enough calcium, they can cramp, hurt, or feel weak. You may have long-term (chronic) muscle aches and pains.  If you don't get enough vitamin D throughout life, you have an increased chance of having thin and brittle bones (osteoporosis) in your later years. Children who don't get enough vitamin D may not grow as much as others their age. They also have a chance of getting a rare disease called rickets. It causes weak bones.  Vitamin D and calcium are added to many foods. And your body uses sunshine to make its own vitamin D.  How much vitamin D do you need?  The Institute of Medicine recommends that people ages 1 through 70 get 600 IU (international units) every day. Adults 71 and older need 800 IU every day.  Blood tests for vitamin D can check your vitamin D level. But there is no standard normal range used by all laboratories. The Institute of Medicine recommends a blood level of 20 ng/mL of vitamin D for healthy bones. And most people in the [place] and [place] meet this goal.  How can you get more vitamin D?  Foods that contain vitamin D include:  ?? Salmon, tuna, and mackerel. These are some of the best foods to eat when you need to get more vitamin D.  ?? Cheese, egg yolks, and beef liver. These foods have vitamin D in small amounts.  ?? Milk, soy drinks, orange juice, yogurt, margarine, and some kinds of cereal have vitamin D added to them.  Some people don't make vitamin D as well as others. They may have to take extra care in getting enough vitamin D.  Things that reduce how much vitamin D your body makes include:  ?? Dark skin, such as many African Americans have.  ?? Age, especially if you are older than 65.  ?? Digestive problems, such as Crohn's or celiac disease.   ?? Liver and kidney disease.  Some people who do not get enough vitamin D may need supplements.  Are there any risks from taking vitamin D?  ?? Too much vitamin D:  ?? Can damage your kidneys.  ?? Can cause nausea and vomiting, constipation, and weakness.  ?? Raises the amount of calcium in your blood. If this happens, you can get confused or have an irregular heart rhythm.  ?? Vitamin D may interact with other medicines. Tell your doctor about all of the medicines you take, including over-the-counter drugs, herbs, and pills. Tell your doctor about all of your current medical problems.  Where can you learn more?  Go to http://www.healthwise.net/GoodHelpConnections.  Enter V530 in the search box to learn more about "Learning About Vitamin D."  Current as of: December 26, 2014  Content Version: 11.3  ?? 2006-2017 [employer]. Care instructions adapted under license by [employer] (which disclaims liability or warranty for this information). If you have questions about a medical condition or this instruction, always ask your healthcare professional. [employer] disclaims any warranty or liability for your use of this information.

## 2015-12-11 NOTE — Progress Notes (Signed)
Chief Complaint   Patient presents with   ??? Hypertension     f/u   ??? Diabetes   ??? Cholesterol Problem     HPI:  Brittney Shelton is a 64 y.o. AA female with hypertension, diabetes, high cholesterol, copd is here to follow up lab results.  Tot chol and LDL are elevated compared to 3 months prior, A1C shows good glycemic control, Serum vit D compared to last reading is trending down ward. Results and management have been discussed, diet and exercise encouraged. Patient agrees with plan.    Review of Systems  As per hpi    Past Medical History:   Diagnosis Date   ??? Chronic obstructive pulmonary disease ([place])    ??? Congestive heart failure ([place])    ??? Diabetes ([place])    ??? Hypertension      History reviewed. No pertinent surgical history.  Social History     Social History   ??? Marital status: SINGLE     Spouse name: N/A   ??? Number of children: N/A   ??? Years of education: N/A     Social History Main Topics   ??? Smoking status: Never Smoker   ??? Smokeless tobacco: Never Used   ??? Alcohol use No   ??? Drug use: No   ??? Sexual activity: No     Other Topics Concern   ??? None     Social History Narrative     Family History   Problem Relation Age of Onset   ??? Diabetes Mother    ??? Arthritis-osteo Father    ??? COPD Sister    ??? Diabetes Brother    ??? Diabetes Brother      Current Outpatient Prescriptions   Medication Sig Dispense Refill   ??? glimepiride (AMARYL) 4 mg tablet TK 1 T PO ONCE D QAM  5   ??? pravastatin (PRAVACHOL) 20 mg tablet TAKE 1 TABLET BY MOUTH ONCE DAILY AT NIGHT 90 Tab 5   ??? metFORMIN (GLUCOPHAGE) 500 mg tablet TAKE 1 TABLET BY MOUTH TWICE DAILY WITH MEALS FOR DIABETES 180 Tab 5   ??? amLODIPine (NORVASC) 10 mg tablet TAKE 1 TABLET BY MOUTH DAILY 90 Tab 3   ??? albuterol-ipratropium (DUO-NEB) 2.5 mg-0.5 mg/3 ml nebu Take 3 mL by inhalation three (3) times daily.     ??? albuterol (PROVENTIL HFA, VENTOLIN HFA, PROAIR HFA) 90 mcg/actuation inhaler Take 1 Puff by inhalation every six (6) hours as needed for Wheezing. 1 Inhaler 0    ??? furosemide (LASIX) 40 mg tablet Take 1 tablet two times a day.  Indications: Edema 180 Tab 1   ??? metoprolol succinate (TOPROL-XL) 200 mg XL tablet TAKE 1 TABLET BY MOUTH DAILY FOR HIGH BLOOD PRESSURE 90 Tab 5   ??? lisinopril (PRINIVIL, ZESTRIL) 40 mg tablet TAKE 1 TABLET BY MOUTH ONCE DAILY 90 Tab 5   ??? isosorbide dinitrate (ISORDIL) 30 mg tablet TAKE 1 TABLET BY MOUTH THREE TIMES DAILY 270 Tab 5   ??? cloNIDine HCl (CATAPRES) 0.1 mg tablet TAKE 1 TABLET BY MOUTH ONCE DAILY 90 Tab 5   ??? aspirin 81 mg chewable tablet Take 81 mg by mouth daily.     ??? calcium carbonate (OS-CAL) 500 mg calcium (1,250 mg) tablet Take  by mouth daily.     ??? Oxygen Take 4 L by inhalation continuous.       No Known Allergies    Objective:  Visit Vitals   ??? BP 173/78   ???  Pulse 73   ??? Temp 95.6 ??F (35.3 ??C) (Oral)   ??? Resp 18   ??? Ht '5\' 6"'$  (1.676 m)   ??? Wt 310 lb (140.6 kg)   ??? SpO2 90%   ??? BMI 50.04 kg/m2     Physical Exam:   General appearance - alert, well appearing, in no distress  Mental status - alert, oriented to person, place, and time  EYE-PERRL, EOMI  Neck - supple, no significant adenopathy   Chest - clear to auscultation, no wheezes, rales or rhonchi  Heart - normal rate, regular rhythm, normal blood pressure  Abdomen - soft, nontender, nondistended, no organomegaly  Ext-peripheral pulses faint in LE,  Bilateral lymphedema   Neuro -alert, oriented, normal speech, no focal findings or movement disorder noted    Results for orders placed or performed in visit on 11/07/15   VITAMIN D, 25 HYDROXY   Result Value Ref Range    VITAMIN D, 25-HYDROXY 17.8 (L) 30.0 - 100.0 ng/mL   LIPID PANEL   Result Value Ref Range    Cholesterol, total 206 (H) 100 - 199 mg/dL    Triglyceride 102 0 - 149 mg/dL    HDL Cholesterol 51 >39 mg/dL    VLDL, calculated 20 5 - 40 mg/dL    LDL, calculated 135 (H) 0 - 99 mg/dL   METABOLIC PANEL, COMPREHENSIVE   Result Value Ref Range    Glucose 133 (H) 65 - 99 mg/dL    BUN 20 8 - 27 mg/dL     Creatinine 0.95 0.57 - 1.00 mg/dL    GFR est non-AA 63 >59 mL/min/1.73    GFR est AA 72 >59 mL/min/1.73    BUN/Creatinine ratio 21 12 - 28    Sodium 142 134 - 144 mmol/L    Potassium 4.1 3.5 - 5.2 mmol/L    Chloride 93 (L) 96 - 106 mmol/L    CO2 29 18 - 29 mmol/L    Calcium 9.1 8.7 - 10.3 mg/dL    Protein, total 8.1 6.0 - 8.5 g/dL    Albumin 4.1 3.6 - 4.8 g/dL    GLOBULIN, TOTAL 4.0 1.5 - 4.5 g/dL    A-G Ratio 1.0 (L) 1.2 - 2.2    Bilirubin, total 1.0 0.0 - 1.2 mg/dL    Alk. phosphatase 77 39 - 117 IU/L    AST (SGOT) 10 0 - 40 IU/L    ALT (SGPT) 5 0 - 32 IU/L   AMB POC GLUCOSE BLOOD, BY GLUCOSE MONITORING DEVICE   Result Value Ref Range    Glucose POC 136 mg/dL   AMB POC HEMOGLOBIN A1C   Result Value Ref Range    Hemoglobin A1c (POC) 6.1 %     Assessment/Plan:    ICD-10-CM ICD-9-CM    1. Well controlled type 2 diabetes mellitus ([place]) E11.9 250.00 MICROALBUMIN, UR, RAND W/ MICROALBUMIN/CREA RATIO   2. Need for hepatitis C screening test Z11.59 V73.89 HEPATITIS C AB   3. Encounter for immunization Z23 V03.89 pneumococcal 13 val conj dip (PREVNAR-13) 0.5 mL syrg injection   4. 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 Hypercholesterolemia E78.00 272.0    6. 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 Colon cancer screening Z12.11 V76.51 OCCULT BLOOD, IMMUNOASSAY (FIT)   8. Diabetic eye exam ([place]) E11.9 V72.0 REFERRAL TO OPHTHALMOLOGY    Z01.00 250.00    9. Hypovitaminosis D E55.9 268.9 cholecalciferol, VITAMIN D3, (VITAMIN D3) 5,000 unit tab  tablet     Patient Instructions        Learning About Vitamin D  Why is it important to get enough vitamin D?  Your body needs vitamin D to absorb calcium. Calcium keeps your bones and muscles, including your heart, healthy and strong. If your muscles don't get enough calcium, they can cramp, hurt, or feel weak. You may have long-term (chronic) muscle aches and pains.  If you don't get enough vitamin D throughout life, you have an increased  chance of having thin and brittle bones (osteoporosis) in your later years. Children who don't get enough vitamin D may not grow as much as others their age. They also have a chance of getting a rare disease called rickets. It causes weak bones.  Vitamin D and calcium are added to many foods. And your body uses sunshine to make its own vitamin D.  How much vitamin D do you need?  The Institute of Medicine recommends that people ages 35 through 21 get 600 IU (international units) every day. Adults 71 and older need 800 IU every day.  Blood tests for vitamin D can check your vitamin D level. But there is no standard normal range used by all laboratories. The Institute of Medicine recommends a blood level of 20 ng/mL of vitamin D for healthy bones. And most people in the [place] meet this goal.  How can you get more vitamin D?  Foods that contain vitamin D include:  ?? Salmon, tuna, and mackerel. These are some of the best foods to eat when you need to get more vitamin D.  ?? Cheese, egg yolks, and beef liver. These foods have vitamin D in small amounts.  ?? Milk, soy drinks, orange juice, yogurt, margarine, and some kinds of cereal have vitamin D added to them.  Some people don't make vitamin D as well as others. They may have to take extra care in getting enough vitamin D.  Things that reduce how much vitamin D your body makes include:  ?? Dark skin, such as many African Americans have.  ?? Age, especially if you are older than 56.  ?? Digestive problems, such as Crohn's or celiac disease.  ?? Liver and kidney disease.  Some people who do not get enough vitamin D may need supplements.  Are there any risks from taking vitamin D?  ?? Too much vitamin D:  ?? Can damage your kidneys.  ?? Can cause nausea and vomiting, constipation, and weakness.  ?? Raises the amount of calcium in your blood. If this happens, you can get confused or have an irregular heart rhythm.   ?? Vitamin D may interact with other medicines. Tell your doctor about all of the medicines you take, including over-the-counter drugs, herbs, and pills. Tell your doctor about all of your current medical problems.  Where can you learn more?  Go to [employer].  Enter V530 in the search box to learn more about "Learning About Vitamin D."  Current as of: December 26, 2014  Content Version: 11.3  ?? 2006-2017 Healthwise[employer]. Care instructions adapted under license by [employer] (which disclaims liability or warranty for this information). If you have questions about a medical condition or this instruction, always ask your healthcare professional. [place] any warranty or liability for your use of this information.      Follow-up Disposition:  Return in about 3 months (around 03/12/2016), or if symptoms worsen or fail to improve, for routine  follow up.

## 2015-12-12 LAB — HEPATITIS C AB: Hep C Virus Ab: <0.1 s/co ratio (ref 0.0–0.9)

## 2015-12-17 ENCOUNTER — Encounter

## 2015-12-17 MED ORDER — FUROSEMIDE 40 MG TAB
40 mg | ORAL_TABLET | ORAL | 0 refills | Status: DC
Start: 2015-12-17 — End: 2016-02-19

## 2015-12-30 ENCOUNTER — Emergency Department: Admit: 2015-12-30 | Payer: MEDICARE | Primary: Internal Medicine

## 2015-12-30 ENCOUNTER — Inpatient Hospital Stay
Admit: 2015-12-30 | Discharge: 2016-01-03 | Disposition: A | Discharge status: Home Health | Payer: MEDICARE | Attending: Internal Medicine | Admitting: Internal Medicine

## 2015-12-30 DIAGNOSIS — J9621 Acute and chronic respiratory failure with hypoxia: Secondary | ICD-10-CM

## 2015-12-30 LAB — METABOLIC PANEL, COMPREHENSIVE
A-G Ratio: 0.6 — ABNORMAL LOW (ref 1.1–2.2)
ALT (SGPT): 17 U/L (ref 12–78)
AST (SGOT): 15 U/L (ref 15–37)
Albumin: 3 g/dL — ABNORMAL LOW (ref 3.5–5.0)
Alk. phosphatase: 84 U/L (ref 45–117)
Anion gap: 6 mmol/L (ref 5–15)
BUN/Creatinine ratio: 20 (ref 12–20)
BUN: 18 MG/DL (ref 6–20)
Bilirubin, total: 1.2 MG/DL — ABNORMAL HIGH (ref 0.2–1.0)
CO2: 34 mmol/L — ABNORMAL HIGH (ref 21–32)
Calcium: 8 MG/DL — ABNORMAL LOW (ref 8.5–10.1)
Chloride: 99 mmol/L (ref 97–108)
Creatinine: 0.91 MG/DL (ref 0.55–1.02)
GFR est AA: >60 mL/min/{1.73_m2} (ref >60)
GFR est non-AA: >60 mL/min/{1.73_m2} (ref >60)
Globulin: 4.7 g/dL — ABNORMAL HIGH (ref 2.0–4.0)
Glucose: 202 mg/dL — ABNORMAL HIGH (ref 65–100)
Potassium: 3.5 mmol/L (ref 3.5–5.1)
Protein, total: 7.7 g/dL (ref 6.4–8.2)
Sodium: 139 mmol/L (ref 136–145)

## 2015-12-30 LAB — CBC WITH AUTOMATED DIFF
ABS. BASOPHILS: 0 10*3/uL (ref 0.0–0.1)
ABS. EOSINOPHILS: 0 10*3/uL (ref 0.0–0.4)
ABS. LYMPHOCYTES: 1.1 10*3/uL (ref 0.8–3.5)
ABS. MONOCYTES: 0.2 10*3/uL (ref 0.0–1.0)
ABS. NEUTROPHILS: 8.6 10*3/uL — ABNORMAL HIGH (ref 1.8–8.0)
BASOPHILS: 0 % (ref 0–1)
EOSINOPHILS: 0 % (ref 0–7)
HCT: 36.8 % (ref 35.0–47.0)
HGB: 11.4 g/dL — ABNORMAL LOW (ref 11.5–16.0)
LYMPHOCYTES: 11 % — ABNORMAL LOW (ref 12–49)
MCH: 28.4 PG (ref 26.0–34.0)
MCHC: 31 g/dL (ref 30.0–36.5)
MCV: 91.8 FL (ref 80.0–99.0)
MONOCYTES: 2 % — ABNORMAL LOW (ref 5–13)
NEUTROPHILS: 87 % — ABNORMAL HIGH (ref 32–75)
PLATELET: 254 10*3/uL (ref 150–400)
RBC: 4.01 M/uL (ref 3.80–5.20)
RDW: 16.9 % — ABNORMAL HIGH (ref 11.5–14.5)
WBC: 9.9 10*3/uL (ref 3.6–11.0)

## 2015-12-30 LAB — TROPONIN I: Troponin-I, Qt.: 0.05 ng/mL — ABNORMAL HIGH (ref <0.05)

## 2015-12-30 LAB — CREATININE, POC
Creatinine (POC): 0.9 MG/DL (ref 0.6–1.3)
GFRAA, POC: >60 mL/min/{1.73_m2} (ref >60)
GFRNA, POC: >60 mL/min/{1.73_m2} (ref >60)

## 2015-12-30 LAB — LACTIC ACID: Lactic acid: 1.8 MMOL/L (ref 0.4–2.0)

## 2015-12-30 LAB — PROTHROMBIN TIME + INR
INR: 1.1 (ref 0.9–1.1)
Prothrombin time: 11.4 s — ABNORMAL HIGH (ref 9.0–11.1)

## 2015-12-30 LAB — CK W/ CKMB & INDEX
CK - MB: 1.4 NG/ML (ref <3.6)
CK-MB Index: 1.7 (ref 0–2.5)
CK: 82 U/L (ref 26–192)

## 2015-12-30 LAB — MAGNESIUM: Magnesium: 1.8 mg/dL (ref 1.6–2.4)

## 2015-12-30 LAB — NT-PRO BNP: NT pro-BNP: 6413 PG/ML — ABNORMAL HIGH (ref 0–125)

## 2015-12-30 MED ORDER — SODIUM CHLORIDE 0.9 % IJ SYRG
Freq: Once | INTRAMUSCULAR | Status: AC
Start: 2015-12-30 — End: 2015-12-30
  Administered 2015-12-30: 19:00:00 via INTRAVENOUS

## 2015-12-30 MED ORDER — IOPAMIDOL 76 % IV SOLN
370 mg iodine /mL (76 %) | Freq: Once | INTRAVENOUS | Status: AC
Start: 2015-12-30 — End: 2015-12-30
  Administered 2015-12-30: 19:00:00 via INTRAVENOUS

## 2015-12-30 MED ORDER — SODIUM CHLORIDE 0.9% BOLUS IV
0.9 % | Freq: Once | INTRAVENOUS | Status: DC
Start: 2015-12-30 — End: 2015-12-30

## 2015-12-30 MED ORDER — SODIUM CHLORIDE 0.9 % IJ SYRG
INTRAMUSCULAR | Status: DC | PRN
Start: 2015-12-30 — End: 2016-01-03
  Administered 2015-12-31: 13:00:00 via INTRAVENOUS

## 2015-12-30 MED ORDER — SODIUM CHLORIDE 0.9 % IJ SYRG
Freq: Three times a day (TID) | INTRAMUSCULAR | Status: DC
Start: 2015-12-30 — End: 2016-01-03
  Administered 2015-12-31 – 2016-01-03 (×12): via INTRAVENOUS

## 2015-12-30 MED ORDER — SODIUM CHLORIDE 0.9 % IV
Freq: Once | INTRAVENOUS | Status: AC
Start: 2015-12-30 — End: 2015-12-31
  Administered 2015-12-30: 19:00:00 via INTRAVENOUS

## 2015-12-30 MED FILL — ISOVUE-370  76 % INTRAVENOUS SOLUTION: 370 mg iodine /mL (76 %) | INTRAVENOUS | Qty: 100

## 2015-12-30 MED FILL — SODIUM CHLORIDE 0.9 % IV: INTRAVENOUS | Qty: 500

## 2015-12-30 MED FILL — SODIUM CHLORIDE 0.9 % IV: INTRAVENOUS | Qty: 1000

## 2015-12-30 MED FILL — BD POSIFLUSH NORMAL SALINE 0.9 % INJECTION SYRINGE: INTRAMUSCULAR | Qty: 10

## 2015-12-30 NOTE — ED Notes (Signed)
Respiratory called again to obtain results of ABG. Hospitalist made aware of patient's decreased sats. Upper 70s to 80s baseline for patient. Patient continues to drop into 50s-60s. Hospitalist states she will be in to evaluate patient shortly.

## 2015-12-30 NOTE — ED Notes (Signed)
Bedside and Verbal shift change received from Megan S, RN. Report included the following information: SBAR, ED Summary, MAR and Recent Results.

## 2015-12-30 NOTE — ED Notes (Signed)
Purewick placed on patient in attempt to obtain urine sample.

## 2015-12-30 NOTE — ED Notes (Signed)
Respiratory called for ABG.

## 2015-12-30 NOTE — ED Notes (Signed)
Patient provided with meal tray.

## 2015-12-30 NOTE — ED Provider Notes (Signed)
HPI Comments: Brittney Shelton, 65 y.o. Female with PMHx significant for COPD, CHF, HTN, and DM, presents ambulatory to [facility] ED with cc of progressively worsening SOB x 4 days. She states that her SOB is exacerbated with ambulation and when she is laying flat. The patient also c/o increased left leg swelling with associated pain. She notes that her SpO2 is typically ~ 80%, but it decreased to 43-44% for a brief period 3-4 days ago while she was ambulating. She states that she is on 4 L of O2 at baseline. She notes that she had a recent long trip out of town 9 days ago. She states that she has been placed on BiPAP before. She denies a history of CAD, intubations, or recent changes to her medications. She specifically denies chest pain.    PCP: Ermalene Postin, MD    Social history significant for: - Tobacco, - EtOH, - Illicit Drug Use    There are no other complaints, changes, or physical findings at this time.  Written by Earle Gell, ED Scribe, as dictated by Rada Hay, DO.    The history is provided by the patient. No language interpreter was used.        Past Medical History:   Diagnosis Date   ??? Chronic obstructive pulmonary disease ([place])    ??? Congestive heart failure ([place])    ??? Diabetes ([place])    ??? Hypertension        History reviewed. No pertinent surgical history.      Family History:   Problem Relation Age of Onset   ??? Diabetes Mother    ??? Arthritis-osteo Father    ??? COPD Sister    ??? Diabetes Brother    ??? Diabetes Brother        Social History     Social History   ??? Marital status: SINGLE     Spouse name: N/A   ??? Number of children: N/A   ??? Years of education: N/A     Occupational History   ??? Not on file.     Social History Main Topics   ??? Smoking status: Never Smoker   ??? Smokeless tobacco: Never Used   ??? Alcohol use No   ??? Drug use: No   ??? Sexual activity: No     Other Topics Concern   ??? Not on file     Social History Narrative         ALLERGIES: Review of patient's allergies indicates no known allergies.     Review of Systems   Constitutional: Negative.  Negative for appetite change, chills, fatigue and fever.   HENT: Negative.  Negative for congestion, rhinorrhea, sinus pressure and sore throat.    Eyes: Negative.    Respiratory: Positive for shortness of breath. Negative for cough, choking, chest tightness and wheezing.    Cardiovascular: Positive for leg swelling (Left). Negative for chest pain and palpitations.   Gastrointestinal: Negative for abdominal pain, constipation, diarrhea, nausea and vomiting.   Endocrine: Negative.    Genitourinary: Negative.  Negative for difficulty urinating, dysuria, flank pain and urgency.   Musculoskeletal: Positive for myalgias (Left leg pain).   Skin: Negative.    Neurological: Negative.  Negative for dizziness, speech difficulty, weakness, light-headedness, numbness and headaches.   Psychiatric/Behavioral: Negative.    All other systems reviewed and are negative.      Vitals:    12/30/15 1319   BP: 162/82   Pulse: (!) 140   Resp: Marland Kitchen)  32   Temp: 97.6 ??F (36.4 ??C)   Weight: 142.4 kg (314 lb)   Height: '5\' 6"'$  (1.676 m)            Physical Exam   Constitutional: She is oriented to person, place, and time. She appears well-developed and well-nourished. No distress.   HENT:   Head: Normocephalic and atraumatic.   Mouth/Throat: Oropharynx is clear and moist. No oropharyngeal exudate.   Eyes: Conjunctivae and EOM are normal. Pupils are equal, round, and reactive to light.   Neck: Normal range of motion. Neck supple. No JVD present. No tracheal deviation present.   Cardiovascular: Normal rate, regular rhythm, normal heart sounds and intact distal pulses.    No murmur heard.  Pulmonary/Chest: No stridor. She is in respiratory distress. She has no wheezes. She has no rales. She exhibits no tenderness.   Increase work of breathing, diminished throughout    Abdominal: Soft. She exhibits no distension. There is no tenderness. There is no rebound and no guarding.   morbidly obese      Musculoskeletal: Normal range of motion. She exhibits no edema or tenderness.   Neurological: She is alert and oriented to person, place, and time. No cranial nerve deficit.   No gross motor or sensory deficits    Skin: Skin is warm and dry. She is not diaphoretic.   Psychiatric: She has a normal mood and affect. Her behavior is normal.   Nursing note and vitals reviewed.       MDM  Number of Diagnoses or Management Options  Diagnosis management comments:   DDx: PE, COPD exacerbation, ACS, pneumothorax       Amount and/or Complexity of Data Reviewed  Clinical lab tests: ordered and reviewed  Tests in the radiology section of CPT??: reviewed and ordered  Tests in the medicine section of CPT??: reviewed and ordered  Review and summarize past medical records: yes  Discuss the patient with other providers: yes (Hospitalist)  Independent visualization of images, tracings, or specimens: yes         ED Course       Procedures     EKG- Sinus tach, rate 140, normal axis, pr, qrs, q waves inferiorly, no acute ST-T wave changes, Gearldine Bienenstock, DO      Pulse Oximetry Analysis - Abnormal ~55% on 4 L NC.    Cardiac Monitor:   Rate: 131   Rhythm: Sinus Tachycardia     Progress Note:  1:20 PM  Given the patient's pulse oximetry, will place the patient on a Venturi mask at 50%. Will observe the patient to see improvement in SpO2. If patient's SpO2 has not improved, will place on BiPAP. The pt's brother is at bedside. He was informed of the plan of care, and he is in understanding and agreement.   Written by Earle Gell, ED Scribe, as dictated by Rada Hay, DO.    Procedure Note- Peripheral IV Access  1:58 PM  Performed by: Rada Hay, DO  Rada Hay, DO gained IV access using  18 gauge needle because the patient had no vascular access.  After cleaning the site with alcohol prep, the Left AC vein was localized with ultrasound guidance in an anterior approach.  Line confirmation was obtained by direct visualization  and good blood return. No anaesthetic was used.  The line was successfully flushed with normal saline and was secured with transparent tape.  Estimated blood loss: < 5 cc  The procedure took 1-15 minutes,  and pt tolerated well.  Written by Earle Gell, ED Scribe, as dictated by Rada Hay, DO.    Progress Note:  5:42 PM  The patient and family were informed of the patient's results and plan of care. Pt and family are in understanding of the results, and they agree with admission at this time.  Written by Earle Gell, ED Scribe, as dictated by Rada Hay, DO.    CONSULT NOTE:   5:43 PM  Rada Hay, DO spoke with Carole Binning, MD,   Specialty: Hospitalist  Discussed pt's hx, disposition, and available diagnostic and imaging results. Reviewed care plans. Consultant will evaluate pt for admission.  Written by Earle Gell, ED Scribe, as dictated by Rada Hay, DO.    CRITICAL CARE NOTE :  5:44 PM  IMPENDING DETERIORATION -Respiratory  ASSOCIATED RISK FACTORS - Hypoxia  MANAGEMENT- Bedside Assessment and Supervision of Care  INTERPRETATION -  Xrays, CT Scan, ECG, Blood Pressure, Cardiac Output Measures  and pulse ox  INTERVENTIONS - oxygen, venturi mask at 50%  CASE REVIEW - Hospitalist  TREATMENT RESPONSE -Stable  PERFORMED BY - Self    NOTES   :  I have spent 40 minutes of critical care time involved in lab review, consultations with specialist, family decision- making, bedside attention and documentation. During this entire length of time I was immediately available to the patient .    LABORATORY TESTS:  Recent Results (from the past 12 hour(s))   EKG, 12 LEAD, INITIAL    Collection Time: 12/30/15  1:20 PM   Result Value Ref Range    Ventricular Rate 140 BPM    Atrial Rate 140 BPM    P-R Interval 148 ms    QRS Duration 86 ms    Q-T Interval 258 ms    QTC Calculation (Bezet) 393 ms    Calculated P Axis 84 degrees    Calculated R Axis 76 degrees    Calculated T Axis 52 degrees    Diagnosis        Sinus tachycardia  Inferior infarct , age undetermined  Abnormal ECG  When compared with ECG of 26-Jul-2015 03:41,  fusion complexes are no longer present  Vent. rate has increased BY  77 BPM  Inferior infarct is now present  ST no longer depressed in Anterior leads  Nonspecific T wave abnormality now evident in Lateral leads     CBC WITH AUTOMATED DIFF    Collection Time: 12/30/15  2:03 PM   Result Value Ref Range    WBC 9.9 3.6 - 11.0 K/uL    RBC 4.01 3.80 - 5.20 M/uL    HGB 11.4 (L) 11.5 - 16.0 g/dL    HCT 36.8 35.0 - 47.0 %    MCV 91.8 80.0 - 99.0 FL    MCH 28.4 26.0 - 34.0 PG    MCHC 31.0 30.0 - 36.5 g/dL    RDW 16.9 (H) 11.5 - 14.5 %    PLATELET 254 150 - 400 K/uL    NEUTROPHILS 87 (H) 32 - 75 %    LYMPHOCYTES 11 (L) 12 - 49 %    MONOCYTES 2 (L) 5 - 13 %    EOSINOPHILS 0 0 - 7 %    BASOPHILS 0 0 - 1 %    ABS. NEUTROPHILS 8.6 (H) 1.8 - 8.0 K/UL    ABS. LYMPHOCYTES 1.1 0.8 - 3.5 K/UL    ABS. MONOCYTES 0.2 0.0 - 1.0 K/UL  ABS. EOSINOPHILS 0.0 0.0 - 0.4 K/UL    ABS. BASOPHILS 0.0 0.0 - 0.1 K/UL   METABOLIC PANEL, COMPREHENSIVE    Collection Time: 12/30/15  2:03 PM   Result Value Ref Range    Sodium 139 136 - 145 mmol/L    Potassium 3.5 3.5 - 5.1 mmol/L    Chloride 99 97 - 108 mmol/L    CO2 34 (H) 21 - 32 mmol/L    Anion gap 6 5 - 15 mmol/L    Glucose 202 (H) 65 - 100 mg/dL    BUN 18 6 - 20 MG/DL    Creatinine 0.91 0.55 - 1.02 MG/DL    BUN/Creatinine ratio 20 12 - 20      GFR est AA >60 >60 ml/min/1.58m    GFR est non-AA >60 >60 ml/min/1.78m   Calcium 8.0 (L) 8.5 - 10.1 MG/DL    Bilirubin, total 1.2 (H) 0.2 - 1.0 MG/DL    ALT (SGPT) 17 12 - 78 U/L    AST (SGOT) 15 15 - 37 U/L    Alk. phosphatase 84 45 - 117 U/L    Protein, total 7.7 6.4 - 8.2 g/dL    Albumin 3.0 (L) 3.5 - 5.0 g/dL    Globulin 4.7 (H) 2.0 - 4.0 g/dL    A-G Ratio 0.6 (L) 1.1 - 2.2     CK W/ CKMB & INDEX    Collection Time: 12/30/15  2:03 PM   Result Value Ref Range    CK 82 26 - 192 U/L    CK - MB 1.4 <3.6 NG/ML    CK-MB Index 1.7 0 - 2.5      MAGNESIUM    Collection Time: 12/30/15  2:03 PM   Result Value Ref Range    Magnesium 1.8 1.6 - 2.4 mg/dL   NT-PRO BNP    Collection Time: 12/30/15  2:03 PM   Result Value Ref Range    NT pro-BNP 6413 (H) 0 - 125 PG/ML   LACTIC ACID    Collection Time: 12/30/15  2:03 PM   Result Value Ref Range    Lactic acid 1.8 0.4 - 2.0 MMOL/L   TROPONIN I    Collection Time: 12/30/15  2:03 PM   Result Value Ref Range    Troponin-I, Qt. 0.05 (H) <0.05 ng/mL   PROTHROMBIN TIME + INR    Collection Time: 12/30/15  2:03 PM   Result Value Ref Range    INR 1.1 0.9 - 1.1      Prothrombin time 11.4 (H) 9.0 - 11.1 sec   POC CREATININE    Collection Time: 12/30/15  2:12 PM   Result Value Ref Range    Creatinine (POC) 0.9 0.6 - 1.3 MG/DL    GFRAA, POC >60 >60 ml/min/1.7341m  GFRNA, POC >60 >60 ml/min/1.17m70m    IMAGING RESULTS:  CT Results  (Last 48 hours)               12/30/15 1542  CTA CHEST W OR W WO CONT Final result    Narrative:  EXAM:  CTA CHEST W OR W WO CONT       INDICATION:   Hypoxia, O2 sats 50's, tachycardic       COMPARISON: None.       TECHNIQUE:    Precontrast scout images were obtained to localize the volume for acquisition.   Multislice helical CT arteriography was performed from the diaphragm to the  thoracic inlet during uneventful rapid bolus of 100 cc Isovue-370. Lung and soft   tissue windows were generated.  Coronal and sagittal images were generated and   3D post processing consisting of coronal maximum intensity images was performed.     CT dose reduction was achieved through use of a standardized protocol tailored   for this examination and automatic exposure control for dose modulation.         FINDINGS:   CHEST:   THYROID: Bilateral thyroid nodules are noted, the largest of which measures 2 cm   in the right gland.   MEDIASTINUM: Enlarged mediastinal lymph nodes are noted, the largest of which   measures 2.4 cm.   HILA: No mass or lymphadenopathy.   THORACIC AORTA: No dissection or aneurysm.    MAIN PULMONARY ARTERY: Normal in caliber. There is no evidence of pulmonary   embolism.   TRACHEA/BRONCHI: Patent.   ESOPHAGUS: No wall thickening or dilatation.   HEART: Normal in size.   LUNGS/PLEURA: There are small bilateral pleural effusions with underlying   atelectasis. Focal consolidation is seen in the right upper lobe as well.   INCIDENTALLY IMAGED UPPER ABDOMEN: No focal abnormality.   BONES: Degenerative changes are seen in the thoracic spine.           IMPRESSION: There is no evidence of pulmonary embolism. Small bilateral   effusions with underlying atelectasis. Right upper lobe consolidation.           CXR Results  (Last 48 hours)               12/30/15 1344  XR CHEST PORT Final result    Narrative:  Indication: Shortness of breath and vomiting       Comparison: 07/27/2015       Portable exam of the chest obtained at 1333 demonstrates stable cardiomegaly.   There is no acute process in the lung fields. The osseous structures are   unremarkable.       Impression: No acute process.                 MEDICATIONS GIVEN:  Medications   sodium chloride (NS) flush 5-10 mL (not administered)   sodium chloride (NS) flush 5-10 mL (not administered)   sodium chloride 0.9 % bolus infusion 500 mL (not administered)   0.9% sodium chloride infusion (50 mL/hr IntraVENous New Bag 12/30/15 1515)   iopamidol (ISOVUE-370) 76 % injection 100 mL (100 mL IntraVENous Given 12/30/15 1515)   sodium chloride (NS) flush 10 mL (10 mL IntraVENous Given 12/30/15 1515)       IMPRESSION:  1. Acute respiratory failure with hypoxia ([place])    2. Pneumonia of right lung due to infectious organism, unspecified part of lung        PLAN:  1. Admit to Hospitalist.    Admit Note:  5:43 PM  Pt is being admitted by Carole Binning, MD. The results of their tests and reason(s) for their admission have been discussed with pt and/or available family. They convey agreement and understanding for the need to be admitted and for admission diagnosis.     This note is prepared by Earle Gell, acting as a Scribe for Rada Hay, [place], DO: The scribe's documentation has been prepared under my direction and personally reviewed by me in its entirety. I confirm that the notes above accurately reflects all work, treatment, procedures, and medical decision making performed by me.

## 2015-12-30 NOTE — ED Notes (Signed)
2 unsuccessful IV attempts by ED staff. Ultrasound machine at bedside for physician to place.

## 2015-12-30 NOTE — ED Notes (Signed)
Bedside and Verbal shift change report given to Amanda (oncoming nurse) by Megan (offgoing nurse). Report included the following information SBAR.

## 2015-12-30 NOTE — ED Notes (Signed)
 bolus cancelled per MD due to swelling.

## 2015-12-30 NOTE — ED Notes (Signed)
Assumed care of pt from triage. Pt reports shortness of breath for past several days, has recently been on long car trip. Pt with tachypnea, tachycardia upon arrival. O2 sats 55% on 4 LPM, uses oxygen at home at baseline. Complains of nausea with one episode of vomiting. Denies pain. Sinus tach on monitor. Pt placed on ventimask at 50% with increase in sats to 79%-85%. Dr. Kizzie Bane at bedside to evaluate. Monitor x3 placed. Call bell within reach.

## 2015-12-30 NOTE — H&P (Signed)
Hospitalist Admission Note    NAME: Brittney Shelton   DOB:  02/24/1951   MRN:  [id number]     Date/Time:  12/30/2015 10:58 PM    Patient PCP: Ermalene Postin, MD  ________________________________________________________________________    My assessment of this patient's clinical condition and my plan of care is as follows.    Assessment / Plan:  Acute on chronic hypoxic respiratory failure likely due to CAP +/- component acute on chronic diastolic dysfunction HF EF 60% ( + orthopnea)  In settings of COPD, mild wheezing   Elevated troponin   Admit to step down ( significantly hypoxic, on VM 15L now)   If worsening may need BIPAP. CO2 at baseline on admission   Aggressive diureses with lasix IV + scheduled NTP  C/w CTX + Zithromax for CAP  Will c/w prednisone PO for COPD + scheduled jet nebs   O2 to keep stas >90%, wean as tolerated down to home 4 L   Mildly elevated troponin due to hypoxia, will follow in am  Follow [facility]    Daily weight   CTA: There is no evidence of pulmonary embolism. Small bilateral effusions with underlying atelectasis. Right upper lobe consolidation    DM type II   BS high side 202, may get worse due to steroids   Holding metformin  Continue Amaryl   C/w SS +/- lantus or NPH while on steroids     HTN  Continue home meds   Holding Imdur while on NTP   ??  Hyperlipidemia, cont statin   Lymphedema  Morbid obesity, Body mass index is 50.68 kg/(m^2).              Code Status: Full code   Surrogate Decision Maker: brother and niece     DVT Prophylaxis: lovenox   GI Prophylaxis: not indicated    Baseline: lives with brother and her niece, no children, ambulating with cane; home O2 4 L         Subjective:   CHIEF COMPLAINT: SOB     HISTORY OF PRESENT ILLNESS:     Brittney Shelton is a 65 y.o.  African American female who presents with above complaint. Pt started with dyspnea 4 days ago. Dyspnea was getting progressively worse. SOB was exacerbated by ambulation and when she is  laying flat. She admits to some chest discomfort but cannot provide details. Pt chronically on home O2 at 4L. No fever/chills. Pt was travelling with family last week. She is normally taking Lasix BID but because of travelling she was taking it only once a day.   ??  Vs: 97.6 ??F (36.4 ??C) -140 - 162/82 - 32 - 79% VM     We were asked to admit for work up and evaluation of the above problems.     Past Medical History:   Diagnosis Date   ??? Chronic obstructive pulmonary disease ([place])    ??? Congestive heart failure ([place])    ??? Diabetes ([place])    ??? Hypertension         History reviewed. No pertinent surgical history.    Social History   Substance Use Topics   ??? Smoking status: Never Smoker   ??? Smokeless tobacco: Never Used   ??? Alcohol use No        Family History   Problem Relation Age of Onset   ??? Diabetes Mother    ??? Arthritis-osteo Father    ??? COPD Sister    ??? Diabetes Brother    ???  Diabetes Brother      No Known Allergies     Prior to Admission medications    Medication Sig Start Date End Date Taking? Authorizing Provider   furosemide (LASIX) 40 mg tablet TAKE 1 TABLET BY MOUTH TWICE DAILY FOR SWELLING 12/17/15   Carlye Grippe Domah, MD   glimepiride (AMARYL) 4 mg tablet TK 1 T PO ONCE D QAM 11/26/15   Historical Provider   cholecalciferol, VITAMIN D3, (VITAMIN D3) 5,000 unit tab tablet Take 1 Tab by mouth daily. 12/11/15   Ermalene Postin, MD   pravastatin (PRAVACHOL) 20 mg tablet TAKE 1 TABLET BY MOUTH ONCE DAILY AT NIGHT 11/26/15   Carlye Grippe Domah, MD   metFORMIN (GLUCOPHAGE) 500 mg tablet TAKE 1 TABLET BY MOUTH TWICE DAILY WITH MEALS FOR DIABETES 11/26/15   Ermalene Postin, MD   amLODIPine (NORVASC) 10 mg tablet TAKE 1 TABLET BY MOUTH DAILY 08/29/15   Ermalene Postin, MD   albuterol-ipratropium (DUO-NEB) 2.5 mg-0.5 mg/3 ml nebu Take 3 mL by inhalation three (3) times daily.    Historical Provider   albuterol (PROVENTIL HFA, VENTOLIN HFA, PROAIR HFA) 90 mcg/actuation inhaler Take 1 Puff by inhalation every six (6) hours as needed for  Wheezing. 07/28/15   Malon Kindle, MD   metoprolol succinate (TOPROL-XL) 200 mg XL tablet TAKE 1 TABLET BY MOUTH DAILY FOR HIGH BLOOD PRESSURE 02/19/15   Ermalene Postin, MD   lisinopril (PRINIVIL, ZESTRIL) 40 mg tablet TAKE 1 TABLET BY MOUTH ONCE DAILY 02/19/15   Ermalene Postin, MD   isosorbide dinitrate (ISORDIL) 30 mg tablet TAKE 1 TABLET BY MOUTH THREE TIMES DAILY 02/19/15   Ermalene Postin, MD   cloNIDine HCl (CATAPRES) 0.1 mg tablet TAKE 1 TABLET BY MOUTH ONCE DAILY 02/19/15   Ermalene Postin, MD   aspirin 81 mg chewable tablet Take 81 mg by mouth daily.    Historical Provider   calcium carbonate (OS-CAL) 500 mg calcium (1,250 mg) tablet Take  by mouth daily.    Historical Provider   Oxygen Take 4 L by inhalation continuous.    Historical Provider       REVIEW OF SYSTEMS:     I am not able to complete the review of systems because:   The patient is intubated and sedated    The patient has altered mental status due to his acute medical problems    The patient has baseline aphasia from prior stroke(s)    The patient has baseline dementia and is not reliable historian    The patient is in acute medical distress and unable to provide information           Total of 12 systems reviewed as follows:       POSITIVE= underlined text  Negative = text not underlined  General:  fever, chills, sweats, generalized weakness, weight loss/gain,      loss of appetite   Eyes:    blurred vision, eye pain, loss of vision, double vision  ENT:    rhinorrhea, pharyngitis   Respiratory:   cough, sputum production, SOB, DOE, wheezing, pleuritic pain   Cardiology:   chest pain, palpitations, orthopnea, PND, edema, syncope   Gastrointestinal:  abdominal pain , N/V, diarrhea, dysphagia, constipation, bleeding   Genitourinary:  frequency, urgency, dysuria, hematuria, incontinence   Muskuloskeletal :  arthralgia, myalgia, back pain  Hematology:  easy bruising, nose or gum bleeding, lymphadenopathy    Dermatological: rash, ulceration, pruritis, color change / jaundice  Endocrine:   hot flashes or polydipsia   Neurological:  headache, dizziness, confusion, focal weakness, paresthesia,     Speech difficulties, memory loss, gait difficulty  Psychological: Feelings of anxiety, depression, agitation    Objective:   VITALS:    Visit Vitals   ??? BP (!) 178/93   ??? Pulse 99   ??? Temp 97.6 ??F (36.4 ??C)   ??? Resp 22   ??? Ht _0  (1.676 m)   ??? Wt 142.4 kg (314 lb)   ??? SpO2 (!) 77%   ??? BMI 50.68 kg/m2       PHYSICAL EXAM:    General:    Alert, cooperative, no distress, appears stated age. Mild dyspnea at rest. Speak in 3-4 words sentences    HEENT: Atraumatic, anicteric sclerae, pink conjunctivae     No oral ulcers, mucosa moist, throat clear, dentition fair  Neck:  Supple, symmetrical,  thyroid: non tender  Lungs:   Bronchial BS BL.  No Wheezing or Rhonchi. No rales.  Chest wall:  No tenderness  No Accessory muscle use.  Heart:   Regular  rhythm,  No  murmur   + edema  Abdomen:   Soft, non-tender. Not distended.  Bowel sounds normal  Extremities: No cyanosis.  No clubbing,      Skin turgor normal, Capillary refill normal, Radial dial pulse 2+  Skin:     Not pale.  Not Jaundiced  No rashes   Psych:  Good insight.  Not depressed.  Not anxious or agitated.  Neurologic: EOMs intact. No facial asymmetry. No aphasia or slurred speech. Symmetrical strength, Sensation grossly intact. Alert and oriented X 4.     _______________________________________________________________________  Care Plan discussed with:    Comments   Patient y    Family      RN y    Transport planner                    Consultant:  y ED provider    _______________________________________________________________________  Expected  Disposition:   Home with Family    HH/PT/OT/RN y   SNF/LTC    SA[facility]    ________________________________________________________________________  TOTAL TIME:  75 Minutes    Critical Care Provided     Minutes non procedure based      Comments     y Reviewed previous records    y Discussion with patient and/or family and questions answered       ________________________________________________________________________  Signed: Frederic Jericho, MD    Procedures: see electronic medical records for all procedures/Xrays and details which were not copied into this note but were reviewed prior to creation of Plan.    LAB DATA REVIEWED:    Recent Results (from the past 24 hour(s))   EKG, 12 LEAD, INITIAL    Collection Time: 12/30/15  1:20 PM   Result Value Ref Range    Ventricular Rate 140 BPM    Atrial Rate 140 BPM    P-R Interval 148 ms    QRS Duration 86 ms    Q-T Interval 258 ms    QTC Calculation (Bezet) 393 ms    Calculated P Axis 84 degrees    Calculated R Axis 76 degrees    Calculated T Axis 52 degrees    Diagnosis       Sinus tachycardia  Inferior infarct , age undetermined  Abnormal ECG  When compared with ECG of 26-Jul-2015 03:41,  fusion complexes are no longer present  Vent. rate  has increased BY  77 BPM  Inferior infarct is now present  ST no longer depressed in Anterior leads  Nonspecific T wave abnormality now evident in Lateral leads     CBC WITH AUTOMATED DIFF    Collection Time: 12/30/15  2:03 PM   Result Value Ref Range    WBC 9.9 3.6 - 11.0 K/uL    RBC 4.01 3.80 - 5.20 M/uL    HGB 11.4 (L) 11.5 - 16.0 g/dL    HCT 36.8 35.0 - 47.0 %    MCV 91.8 80.0 - 99.0 FL    MCH 28.4 26.0 - 34.0 PG    MCHC 31.0 30.0 - 36.5 g/dL    RDW 16.9 (H) 11.5 - 14.5 %    PLATELET 254 150 - 400 K/uL    NEUTROPHILS 87 (H) 32 - 75 %    LYMPHOCYTES 11 (L) 12 - 49 %    MONOCYTES 2 (L) 5 - 13 %    EOSINOPHILS 0 0 - 7 %    BASOPHILS 0 0 - 1 %    ABS. NEUTROPHILS 8.6 (H) 1.8 - 8.0 K/UL    ABS. LYMPHOCYTES 1.1 0.8 - 3.5 K/UL    ABS. MONOCYTES 0.2 0.0 - 1.0 K/UL    ABS. EOSINOPHILS 0.0 0.0 - 0.4 K/UL    ABS. BASOPHILS 0.0 0.0 - 0.1 K/UL   METABOLIC PANEL, COMPREHENSIVE    Collection Time: 12/30/15  2:03 PM   Result Value Ref Range    Sodium 139 136 - 145 mmol/L     Potassium 3.5 3.5 - 5.1 mmol/L    Chloride 99 97 - 108 mmol/L    CO2 34 (H) 21 - 32 mmol/L    Anion gap 6 5 - 15 mmol/L    Glucose 202 (H) 65 - 100 mg/dL    BUN 18 6 - 20 MG/DL    Creatinine 0.91 0.55 - 1.02 MG/DL    BUN/Creatinine ratio 20 12 - 20      GFR est AA >60 >60 ml/min/1.18m    GFR est non-AA >60 >60 ml/min/1.743m   Calcium 8.0 (L) 8.5 - 10.1 MG/DL    Bilirubin, total 1.2 (H) 0.2 - 1.0 MG/DL    ALT (SGPT) 17 12 - 78 U/L    AST (SGOT) 15 15 - 37 U/L    Alk. phosphatase 84 45 - 117 U/L    Protein, total 7.7 6.4 - 8.2 g/dL    Albumin 3.0 (L) 3.5 - 5.0 g/dL    Globulin 4.7 (H) 2.0 - 4.0 g/dL    A-G Ratio 0.6 (L) 1.1 - 2.2     CK W/ CKMB & INDEX    Collection Time: 12/30/15  2:03 PM   Result Value Ref Range    CK 82 26 - 192 U/L    CK - MB 1.4 <3.6 NG/ML    CK-MB Index 1.7 0 - 2.5     MAGNESIUM    Collection Time: 12/30/15  2:03 PM   Result Value Ref Range    Magnesium 1.8 1.6 - 2.4 mg/dL   NT-PRO BNP    Collection Time: 12/30/15  2:03 PM   Result Value Ref Range    NT pro-BNP 6413 (H) 0 - 125 PG/ML   LACTIC ACID    Collection Time: 12/30/15  2:03 PM   Result Value Ref Range    Lactic acid 1.8 0.4 - 2.0 MMOL/L   TROPONIN I    Collection Time: 12/30/15  2:03 PM   Result Value Ref Range    Troponin-I, Qt. 0.05 (H) <0.05 ng/mL   PROTHROMBIN TIME + INR    Collection Time: 12/30/15  2:03 PM   Result Value Ref Range    INR 1.1 0.9 - 1.1      Prothrombin time 11.4 (H) 9.0 - 11.1 sec   POC CREATININE    Collection Time: 12/30/15  2:12 PM   Result Value Ref Range    Creatinine (POC) 0.9 0.6 - 1.3 MG/DL    GFRAA, POC >60 >60 ml/min/1.32m    GFRNA, POC >60 >60 ml/min/1.790m

## 2015-12-31 LAB — BLOOD GAS, ARTERIAL
BASE EXCESS: 9.2 mmol/L
BICARBONATE: 26 mmol/L (ref 22–26)
FIO2: 50 %
O2 SAT: 90 % — ABNORMAL LOW (ref 92–97)
PCO2: 60 mmHg — ABNORMAL HIGH (ref 35.0–45.0)
PO2: 59 mmHg — ABNORMAL LOW (ref 80–100)
SPONTANEOUS RATE: 24
pH: 7.4 (ref 7.35–7.45)

## 2015-12-31 LAB — URINALYSIS W/MICROSCOPIC
Bacteria: NEGATIVE /hpf
Blood: NEGATIVE
Glucose: NEGATIVE mg/dL
Ketone: NEGATIVE mg/dL
Leukocyte Esterase: NEGATIVE
Nitrites: NEGATIVE
Protein: 100 mg/dL — AB
Specific gravity: 1.01 (ref 1.003–1.030)
Urobilinogen: 1 EU/dL (ref 0.2–1.0)
pH (UA): 6 (ref 5.0–8.0)

## 2015-12-31 LAB — GLUCOSE, POC
Glucose (POC): 199 mg/dL — ABNORMAL HIGH (ref 65–100)
Glucose (POC): 250 mg/dL — ABNORMAL HIGH (ref 65–100)
Glucose (POC): 180 mg/dL — ABNORMAL HIGH (ref 65–100)

## 2015-12-31 LAB — EKG, 12 LEAD, INITIAL
Atrial Rate: 140 {beats}/min
Calculated P Axis: 84 degrees
Calculated R Axis: 76 degrees
Calculated T Axis: 52 degrees
P-R Interval: 148 ms
Q-T Interval: 258 ms
QRS Duration: 86 ms
QTC Calculation (Bezet): 393 ms
Ventricular Rate: 140 {beats}/min

## 2015-12-31 LAB — BILIRUBIN, CONFIRM: Bilirubin UA, confirm: NEGATIVE

## 2015-12-31 MED ORDER — CHOLECALCIFEROL (VITAMIN D3) 1,000 UNIT (25 MCG) TAB
Freq: Every day | ORAL | Status: DC
Start: 2015-12-31 — End: 2016-01-03
  Administered 2015-12-31 – 2016-01-03 (×4): via ORAL

## 2015-12-31 MED ORDER — LISINOPRIL 20 MG TAB
20 mg | Freq: Every day | ORAL | Status: DC
Start: 2015-12-31 — End: 2016-01-03
  Administered 2015-12-31 – 2016-01-03 (×4): via ORAL

## 2015-12-31 MED ORDER — ASPIRIN 81 MG CHEWABLE TAB
81 mg | Freq: Every day | ORAL | Status: DC
Start: 2015-12-31 — End: 2016-01-03
  Administered 2015-12-31 – 2016-01-03 (×4): via ORAL

## 2015-12-31 MED ORDER — GLUCAGON 1 MG INJECTION
1 mg | INTRAMUSCULAR | Status: DC | PRN
Start: 2015-12-31 — End: 2016-01-03

## 2015-12-31 MED ORDER — SODIUM CHLORIDE 0.9 % IJ SYRG
INTRAMUSCULAR | Status: DC | PRN
Start: 2015-12-31 — End: 2016-01-03
  Administered 2015-12-31 (×2): via INTRAVENOUS

## 2015-12-31 MED ORDER — ONDANSETRON (PF) 4 MG/2 ML INJECTION
4 mg/2 mL | INTRAMUSCULAR | Status: DC | PRN
Start: 2015-12-31 — End: 2016-01-03
  Administered 2016-01-01: 06:00:00 via INTRAVENOUS

## 2015-12-31 MED ORDER — DEXTROSE 50% IN WATER (D50W) IV SYRG
INTRAVENOUS | Status: DC | PRN
Start: 2015-12-31 — End: 2016-01-03

## 2015-12-31 MED ORDER — NITROGLYCERIN 2 % TRANSDERMAL OINTMENT
2 % | Freq: Four times a day (QID) | TRANSDERMAL | Status: DC
Start: 2015-12-31 — End: 2016-01-03
  Administered 2015-12-31 – 2016-01-03 (×15): via TOPICAL

## 2015-12-31 MED ORDER — GLIMEPIRIDE 1 MG TAB
1 mg | Freq: Every day | ORAL | Status: DC
Start: 2015-12-31 — End: 2016-01-03
  Administered 2015-12-31 – 2016-01-03 (×4): via ORAL

## 2015-12-31 MED ORDER — IPRATROPIUM-ALBUTEROL 2.5 MG-0.5 MG/3 ML NEB SOLUTION
2.5 mg-0.5 mg/3 ml | RESPIRATORY_TRACT | Status: DC
Start: 2015-12-31 — End: 2016-01-02
  Administered 2015-12-31 – 2016-01-02 (×13): via RESPIRATORY_TRACT

## 2015-12-31 MED ORDER — METOPROLOL SUCCINATE SR 50 MG 24 HR TAB
50 mg | Freq: Every day | ORAL | Status: DC
Start: 2015-12-31 — End: 2016-01-03
  Administered 2015-12-31 – 2016-01-03 (×4): via ORAL

## 2015-12-31 MED ORDER — HYDRALAZINE 20 MG/ML IJ SOLN
20 mg/mL | Freq: Four times a day (QID) | INTRAMUSCULAR | Status: DC | PRN
Start: 2015-12-31 — End: 2016-01-03

## 2015-12-31 MED ORDER — ACETAMINOPHEN 325 MG TABLET
325 mg | ORAL | Status: DC | PRN
Start: 2015-12-31 — End: 2016-01-03
  Administered 2016-01-01 (×2): via ORAL

## 2015-12-31 MED ORDER — GLUCOSE 4 GRAM CHEWABLE TAB
4 gram | ORAL | Status: DC | PRN
Start: 2015-12-31 — End: 2016-01-03

## 2015-12-31 MED ORDER — SODIUM CHLORIDE 0.9 % IJ SYRG
Freq: Three times a day (TID) | INTRAMUSCULAR | Status: DC
Start: 2015-12-31 — End: 2016-01-03
  Administered 2015-12-31 – 2016-01-03 (×12): via INTRAVENOUS

## 2015-12-31 MED ORDER — AMLODIPINE 5 MG TAB
5 mg | Freq: Every day | ORAL | Status: DC
Start: 2015-12-31 — End: 2016-01-03
  Administered 2015-12-31 – 2016-01-03 (×4): via ORAL

## 2015-12-31 MED ORDER — CALCIUM CARBONATE 500 MG (1250 MG) TAB
500 mg calcium (1,250 mg) | Freq: Every day | ORAL | Status: DC
Start: 2015-12-31 — End: 2016-01-03
  Administered 2015-12-31 – 2016-01-03 (×4): via ORAL

## 2015-12-31 MED ORDER — INSULIN LISPRO 100 UNIT/ML INJECTION
100 unit/mL | Freq: Four times a day (QID) | SUBCUTANEOUS | Status: DC
Start: 2015-12-31 — End: 2016-01-03
  Administered 2015-12-31 – 2016-01-03 (×9): via SUBCUTANEOUS

## 2015-12-31 MED ORDER — PRAVASTATIN 10 MG TAB
10 mg | Freq: Every evening | ORAL | Status: DC
Start: 2015-12-31 — End: 2016-01-03
  Administered 2016-01-01 – 2016-01-03 (×3): via ORAL

## 2015-12-31 MED ORDER — CLONIDINE 0.1 MG TAB
0.1 mg | Freq: Every day | ORAL | Status: DC
Start: 2015-12-31 — End: 2016-01-03
  Administered 2015-12-31 – 2016-01-03 (×4): via ORAL

## 2015-12-31 MED ORDER — PREDNISONE 10 MG TAB
10 mg | ORAL | Status: AC
Start: 2015-12-31 — End: 2015-12-31
  Administered 2015-12-31: 05:00:00 via ORAL

## 2015-12-31 MED ORDER — CEFTRIAXONE 1 GRAM SOLUTION FOR INJECTION
1 gram | INTRAMUSCULAR | Status: DC
Start: 2015-12-31 — End: 2016-01-01
  Administered 2015-12-31 – 2016-01-01 (×2): via INTRAVENOUS

## 2015-12-31 MED ORDER — ENOXAPARIN 40 MG/0.4 ML SUB-Q SYRINGE
40 mg/0.4 mL | Freq: Two times a day (BID) | SUBCUTANEOUS | Status: DC
Start: 2015-12-31 — End: 2016-01-03
  Administered 2015-12-31 – 2016-01-03 (×7): via SUBCUTANEOUS

## 2015-12-31 MED ORDER — FUROSEMIDE 10 MG/ML IJ SOLN
10 mg/mL | Freq: Two times a day (BID) | INTRAMUSCULAR | Status: DC
Start: 2015-12-31 — End: 2016-01-01
  Administered 2015-12-31 – 2016-01-01 (×4): via INTRAVENOUS

## 2015-12-31 MED ORDER — GUAIFENESIN 600 MG TABLET,EXTENDED RELEASE BIPHASIC 12 HR
600 mg | Freq: Two times a day (BID) | ORAL | Status: DC
Start: 2015-12-31 — End: 2016-01-03
  Administered 2015-12-31 – 2016-01-03 (×7): via ORAL

## 2015-12-31 MED ORDER — PREDNISONE 20 MG TAB
20 mg | Freq: Every day | ORAL | Status: DC
Start: 2015-12-31 — End: 2016-01-03
  Administered 2015-12-31 – 2016-01-03 (×4): via ORAL

## 2015-12-31 MED ORDER — AZITHROMYCIN 500 MG IV SOLUTION
500 mg | INTRAVENOUS | Status: DC
Start: 2015-12-31 — End: 2016-01-01
  Administered 2015-12-31 – 2016-01-01 (×2): via INTRAVENOUS

## 2015-12-31 MED FILL — IPRATROPIUM-ALBUTEROL 2.5 MG-0.5 MG/3 ML NEB SOLUTION: 2.5 mg-0.5 mg/3 ml | RESPIRATORY_TRACT | Qty: 3

## 2015-12-31 MED FILL — AZITHROMYCIN 500 MG IV SOLUTION: 500 mg | INTRAVENOUS | Qty: 5

## 2015-12-31 MED FILL — FUROSEMIDE 10 MG/ML IJ SOLN: 10 mg/mL | INTRAMUSCULAR | Qty: 4

## 2015-12-31 MED FILL — CALCIUM CARBONATE 500 MG (1250 MG) TAB: 500 mg calcium (1,250 mg) | ORAL | Qty: 1

## 2015-12-31 MED FILL — LOVENOX 40 MG/0.4 ML SUBCUTANEOUS SYRINGE: 40 mg/0.4 mL | SUBCUTANEOUS | Qty: 0.4

## 2015-12-31 MED FILL — CEFTRIAXONE 1 GRAM SOLUTION FOR INJECTION: 1 gram | INTRAMUSCULAR | Qty: 1

## 2015-12-31 MED FILL — CHILDREN'S ASPIRIN 81 MG CHEWABLE TABLET: 81 mg | ORAL | Qty: 1

## 2015-12-31 MED FILL — AMLODIPINE 5 MG TAB: 5 mg | ORAL | Qty: 2

## 2015-12-31 MED FILL — PREDNISONE 20 MG TAB: 20 mg | ORAL | Qty: 2

## 2015-12-31 MED FILL — MUCINEX 600 MG TABLET, EXTENDED RELEASE: 600 mg | ORAL | Qty: 1

## 2015-12-31 MED FILL — VITAMIN D3 25 MCG (1,000 UNIT) TABLET: 25 mcg (1,000 unit) | ORAL | Qty: 5

## 2015-12-31 MED FILL — GLIMEPIRIDE 4 MG TAB: 4 mg | ORAL | Qty: 1

## 2015-12-31 MED FILL — NITRO-BID 2 % TRANSDERMAL OINTMENT: 2 % | TRANSDERMAL | Qty: 1

## 2015-12-31 MED FILL — BD POSIFLUSH NORMAL SALINE 0.9 % INJECTION SYRINGE: INTRAMUSCULAR | Qty: 10

## 2015-12-31 MED FILL — INSULIN LISPRO 100 UNIT/ML INJECTION: 100 unit/mL | SUBCUTANEOUS | Qty: 1

## 2015-12-31 MED FILL — PREDNISONE 10 MG TAB: 10 mg | ORAL | Qty: 4

## 2015-12-31 MED FILL — LISINOPRIL 20 MG TAB: 20 mg | ORAL | Qty: 2

## 2015-12-31 MED FILL — GLIMEPIRIDE 1 MG TAB: 1 mg | ORAL | Qty: 4

## 2015-12-31 MED FILL — CLONIDINE 0.1 MG TAB: 0.1 mg | ORAL | Qty: 1

## 2015-12-31 MED FILL — TOPROL XL 50 MG TABLET,EXTENDED RELEASE: 50 mg | ORAL | Qty: 4

## 2015-12-31 MED FILL — BD POSIFLUSH NORMAL SALINE 0.9 % INJECTION SYRINGE: INTRAMUSCULAR | Qty: 30

## 2015-12-31 NOTE — ED Notes (Signed)
Attempted to call report. RN will call back shortly.

## 2015-12-31 NOTE — ED Notes (Signed)
Charge nurse spoke to hospitalist and she is aware of the patient and her fluctuating oxygen sats and states that this is the reason she placed the patient in PCU, she further states that she does not feel that the patient needs high flow oxygen or a BiPAP at this time. Supervisor is aware and hospitalist further states that the patient was sating 90% while she was in the roo with her and that she did not feel the patient was in any distress at this time. Charge nurse to the room and patient oxygen probe was adjusted on the finger and sats are currently fluctuating 88-89% consistently.  PCN informed that PCU would be taking this admission and supervisor has notified that unit.

## 2015-12-31 NOTE — Progress Notes (Signed)
TRANSFER - IN REPORT:    Verbal report received from Valatie, RN(name) on Brittney Shelton  being received from ED(unit) for routine progression of care      Report consisted of patient???s Situation, Background, Assessment and   Recommendations(SBAR).     Information from the following report(s) ED Summary, Intake/Output, Recent Results and Cardiac Rhythm SR/ST was reviewed with the receiving nurse.    Opportunity for questions and clarification was provided.      Assessment completed upon patient???s arrival to unit and care assumed.

## 2015-12-31 NOTE — ED Notes (Addendum)
Sue Lush, RN refusing report stating she is "uncomfortable receiving patient" due to low oxygen sats and states she will need to call supervisor before accepting patient. Charge nurse aware.

## 2015-12-31 NOTE — Progress Notes (Signed)
Problem: Mobility Impaired (Adult and Pediatric)  Goal: *Acute Goals and Plan of Care (Insert Text)  Physical Therapy Goals  Initiated 12/31/2015  1. Patient will move from supine to sit and sit to supine , scoot up and down and roll side to side in bed with independence within 7 day(s).   2. Patient will transfer from bed to chair and chair to bed with modified independence using the least restrictive device within 7 day(s).  3. Patient will perform sit to stand with modified independence within 7 day(s).  4. Patient will ambulate with modified independence for 100 feet with the least restrictive device within 7 day(s).   5. Patient will ascend/descend 3 stairs with one sided handrail(s) with supervision/set-up within 7 day(s).  PHYSICAL THERAPY EVALUATION  Patient: Brittney Shelton [phone] y.o. female)  Date: 12/31/2015  Primary Diagnosis: Pneumonia        Precautions:          ASSESSMENT :  Based on the objective data described below, the patient presents with decreased strength, decreased functional mobility, unsteady gait, and decreased O2 sats following admission for pneumonia. PTA patient lives with her brother. She is independent with all aspects of functional mobility and ambulates mod I with SPC. She is on 2L O2 at baseline. Currently, patient received resting in bed, agreeable and cleared for PT. O2 sats at 80% on 5L O2 at rest. Patient reports she is not SOB and feeling up to it as 80% is her baseline. She required CGA for all aspects of functional mobility. O2 sats dropped to 59-60% on 5L O2 with donning socks on EOB. Patient able to recover to 85% with PLB cues and stood with CGA and SPC to get to the chair. Patient with unsteady gait with wide BOS and trunk sway. Patient with decreased activity tolerance and strength compared to her baseline. Patient left sitting in the chair at the conclusion of PT evaluation with O2 sats at 90%. Patient will benefit from [facility] PT at discharge.       Patient will benefit from skilled intervention to address the above impairments.  Patient???s rehabilitation potential is considered to be Fair  Factors which may influence rehabilitation potential include:   [ ]          None noted  [ ]          Mental ability/status  [X]          Medical condition  [ ]          Home/family situation and support systems  [ ]          Safety awareness  [ ]          Pain tolerance/management  [ ]          Other:        PLAN :  Recommendations and Planned Interventions:  [X]            Bed Mobility Training             [ ]     Neuromuscular Re-Education  [X]            Transfer Training                   [ ]     Orthotic/Prosthetic Training  [X]            Gait Training                         [ ]   Modalities  [X]            Therapeutic Exercises           [ ]     Edema Management/Control  [X]            Therapeutic Activities            [X]     Patient and Family Training/Education  [ ]            Other (comment):     Frequency/Duration: Patient will be followed by physical therapy  4 times a week to address goals.  Discharge Recommendations: Home Health  Further Equipment Recommendations for Discharge: owns [facility]       SUBJECTIVE:   Patient stated ???I don't usually feel SOB, just sleepy.???      OBJECTIVE DATA SUMMARY:   HISTORY:    Past Medical History:   Diagnosis Date   ??? Chronic obstructive pulmonary disease (H[facility])     ??? Congestive heart failure ([facility])     ??? Diabetes ([facility])     ??? Hypertension     History reviewed. No pertinent surgical history.  Prior Level of Function/Home Situation: patient lives with her brother. She is independent with all aspects of functional mobility and ambulates mod I with SPC. She is on 2L O2 at baseline.   Personal factors and/or comorbidities impacting plan of care: SOB, dependent on 2LO2     Home Situation  Home Environment: Private residence  # Steps to Enter: 3  Rails to Enter: Yes  Hand Rails : Left  One/Two Story Residence: One story  Living Alone: No   Support Systems: Family member(s)  Patient Expects to be Discharged to:: Private residence  Current DME Used/Available at Home: Gilmer Mor, straight, Scientist, physiological, Environmental consultant, rolling  Tub or Shower Type: Tub/Shower combination     EXAMINATION/PRESENTATION/DECISION MAKING:   Critical Behavior:  Neurologic State: Alert  Orientation Level: Oriented X4  Cognition: Follows commands  Safety/Judgement: Awareness of environment, Fall prevention  Hearing:  Auditory  Auditory Impairment: None  Skin:  intact  Edema: moderate edema  Range Of Motion:  AROM: Generally decreased, functional           PROM: Within functional limits           Strength:    Strength: Generally decreased, functional                    Tone & Sensation:   Tone: Normal              Sensation: Intact               Coordination:  Coordination: Within functional limits  Vision:   Tracking: Able to track stimulus in all quadrants w/o difficulty  Acuity: Within Defined Limits  Functional Mobility:  Bed Mobility:  Rolling: Contact guard assistance  Supine to Sit: Contact guard assistance     Scooting: Contact guard assistance  Transfers:  Sit to Stand: Contact guard assistance  Stand to Sit: Contact guard assistance        Bed to Chair: Contact guard assistance              Balance:   Sitting: Intact;Without support  Standing: Impaired;With support  Standing - Static: Constant support;Fair  Standing - Dynamic : Fair  Ambulation/Gait Training:  Distance (ft): 5 Feet (ft)  Assistive Device: Gait belt;Cane, straight  Ambulation - Level of Assistance: Contact guard assistance     Gait Description (WDL): Exceptions to [facility]  Gait Abnormalities: Decreased step clearance;Trunk sway increased        Base of Support: Widened     Speed/Cadence: Pace decreased (<100 feet/min)  Step Length: Right shortened;Left shortened              Therapeutic Exercises:   PLB cues     Functional Measure:  Barthel Index:      Bathing: 0  Bladder: 10  Bowels: 10  Grooming: 0  Dressing: 5  Feeding: 10   Mobility: 0  Stairs: 0  Toilet Use: 5  Transfer (Bed to Chair and Back): 10  Total: 50         Barthel and G-code impairment scale:  Percentage of impairment CH  0% CI  1-19% CJ  20-39% CK  40-59% CL  60-79% CM  80-99% CN  100%   Barthel Score 0-100 100 99-80 79-60 59-40 20-39 1-19    0   Barthel Score 0-20 20 17-19 13-16 9-12 5-8 1-4 0      The Barthel ADL Index: Guidelines  1. The index should be used as a record of what a patient does, not as a record of what a patient could do.  2. The main aim is to establish degree of independence from any help, physical or verbal, however minor and for whatever reason.  3. The need for supervision renders the patient not independent.  4. A patient's performance should be established using the best available evidence. Asking the patient, friends/relatives and nurses are the usual sources, but direct observation and common sense are also important. However direct testing is not needed.  5. Usually the patient's performance over the preceding 24-48 hours is important, but occasionally longer periods will be relevant.  6. Middle categories imply that the patient supplies over 50 per cent of the effort.  7. Use of aids to be independent is allowed.     Clarisa Kindred., Barthel, D.W. [phone]). Functional evaluation: the Barthel Index. Md State Med J (14)2.  Zenaida Niece der Price, J.J.M.F, [place], Ian Malkin., Margret Chance., [place], [place]. (1999). Measuring the change indisability after inpatient rehabilitation; comparison of the responsiveness of the Barthel Index and Functional Independence Measure. Journal of Neurology, Neurosurgery, and Psychiatry, 66(4), [phone].  Dawson Bills, N.J.A, [place],  W.J.M, & [place], M.A. (2004.) Assessment of post-stroke quality of life in cost-effectiveness studies: The usefulness of the Barthel Index and the EuroQoL-5D. Quality of Life Research, 13, 147-82            G codes:  In compliance with CMS???s Claims Based Outcome Reporting, the following  G-code set was chosen for this patient based on their primary functional limitation being treated:     The outcome measure chosen to determine the severity of the functional limitation was the Barthel with a score of 50/100 which was correlated with the impairment scale.      ?? Mobility - Walking and Moving Around:              N5621 - CURRENT STATUS:    CK - 40%-59% impaired, limited or restricted              G8979 - GOAL STATUS:           CJ - 20%-39% impaired, limited or restricted              H0865 - D/C STATUS:                       ---------------  To be determined---------------      Physical Therapy Evaluation Charge Determination   History Examination Presentation Decision-Making   MEDIUM  Complexity : 1-2 comorbidities / personal factors will impact the outcome/ POC  MEDIUM Complexity : 3 Standardized tests and measures addressing body structure, function, activity limitation and / or participation in recreation  HIGH Complexity : Unstable and unpredictable characteristics  Other outcome measures Barthel  MEDIUM      Based on the above components, the patient evaluation is determined to be of the following complexity level: MEDIUM     Pain:  Pain Scale 1: Numeric (0 - 10)  Pain Intensity 1: 0              Activity Tolerance:   Fair, O2 sats dropped to 60% on 5L O2  Please refer to the flowsheet for vital signs taken during this treatment.  After treatment:   [X]          Patient left in no apparent distress sitting up in chair  [ ]          Patient left in no apparent distress in bed  [X]          Call bell left within reach  [X]          Nursing notified  [ ]          Caregiver present  [ ]          Bed alarm activated      COMMUNICATION/EDUCATION:   The patient???s plan of care was discussed with: Occupational Therapist, Registered Nurse and Child psychotherapist.  [X]          Fall prevention education was provided and the patient/caregiver indicated understanding.   [X]          Patient/family have participated as able in goal setting and plan of care.  [X]          Patient/family agree to work toward stated goals and plan of care.  [ ]          Patient understands intent and goals of therapy, but is neutral about his/her participation.  [ ]          Patient is unable to participate in goal setting and plan of care.     Thank you for this referral.  Jethro Bolus, PT, DPT   Time Calculation: 25 mins

## 2015-12-31 NOTE — Progress Notes (Signed)
Hospitalist Progress Note    NAME: Brittney Shelton   DOB:  1951-05-28   MRN:  [id number]       Assessment / Plan:  Acute on chronic hypoxic respiratory failure POA - close to baseline Oxygen today 4-5 L/m via NC  due to RUL Pneumonia POA - on CTA chest  Chronic diastolic dysfunction HF EF 60% POA- seems to be compensated  Mild acute COPD exacerbation POA   CTA: There is no evidence of pulmonary embolism. Small bilateral effusions with underlying atelectasis. Right upper lobe consolidation    Cont oxygen via NC, avoid NRB mask, bipap prn only, taper to home 4L/m as able  Cont aggressive diureses with lasix IV + scheduled NTP  C/w CTX + Zithromax for CAP  Cont prednisone PO for COPD + scheduled jet nebs   Follow Blood Cx   Daily weight   Add mucinex    ??  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  HTN  Continue home meds   Holding Imdur while on NTP   ????  Hyperlipidemia, cont statin   Lymphedema  Morbid obesity, Body mass index is 50.68 kg/(m^2).  ??  ??  ??  ??  ??  ??  Code Status: DNR as per pt's wishes today   Surrogate Decision Maker: brother and niece   ??  DVT Prophylaxis: lovenox   GI Prophylaxis: not indicated  ??  Baseline: lives with brother and her niece, no children, ambulating with cane; home O2 4 L   Recommended Disposition: Home w/Family + HH PT/OT likely     Subjective:     Chief Complaint / Reason for Physician Visit: F/u SOB, Cough  "I feel little better".  Discussed with RN events overnight.     Review of Systems:  Symptom Y/N Comments  Symptom Y/N Comments   Fever/Chills n   Chest Pain n    Poor Appetite n   Edema n    Cough y   Abdominal Pain n    Sputum y   Joint Pain n    SOB/DOE y improved  Pruritis/Rash     Nausea/vomit    Tolerating PT/OT y    Diarrhea    Tolerating Diet y    Constipation    Other       Could NOT obtain due to:      Objective:     VITALS:    Last 24hrs VS reviewed since prior progress note. Most recent are:  Patient Vitals for the past 24 hrs:   Temp Pulse Resp BP SpO2   12/31/15 1500 - - - - 92 %   12/31/15 1443 98 ??F (36.7 ??C) 76 18 139/49 90 %   12/31/15 1129 98.1 ??F (36.7 ??C) 79 19 140/66 90 %   12/31/15 1037 - 81 - - (!) 88 %   12/31/15 1005 - - - 165/76 -   12/31/15 1001 - - - - 92 %   12/31/15 0730 - - - - 91 %   12/31/15 0714 98.6 ??F (37 ??C) (!) 109 19 172/80 (!) 87 %   12/31/15 0416 - - - - (!) 83 %   12/31/15 0321 98.9 ??F (37.2 ??C) 97 24 178/77 (!) 81 %   12/31/15 0232 - 94 22 - (!) 86 %   12/31/15 0231 - 95 21 - (!)  86 %   12/31/15 0230 - 94 23 184/73 (!) 87 %   12/31/15 0229 - 94 22 - (!) 87 %   12/31/15 0228 - 96 21 - (!) 88 %   12/31/15 0227 - 97 22 - 91 %   12/31/15 0226 - 96 22 - (!) 87 %   12/31/15 0225 - 98 22 - (!) 89 %   12/31/15 0224 - (!) 101 23 - (!) 88 %   12/31/15 0223 - 100 24 - (!) 89 %   12/31/15 0222 - 100 24 - (!) 89 %   12/31/15 0221 - 100 24 - (!) 88 %   12/31/15 0220 - 100 24 - (!) 88 %   12/31/15 0219 - (!) 101 25 - (!) 89 %   12/31/15 0218 - (!) 101 24 - 90 %   12/31/15 0210 - 98 24 - (!) 89 %   12/31/15 0130 - (!) 102 19 177/70 (!) 81 %   12/31/15 0106 - 93 19 - 93 %   12/31/15 0100 - (!) 102 21 173/69 91 %   12/31/15 0033 - (!) 101 - 177/80 -   12/30/15 2300 - 97 21 157/69 (!) 71 %   12/30/15 2230 - 91 22 150/80 (!) 87 %   12/30/15 2200 - 98 24 141/76 (!) 82 %   12/30/15 2130 - 98 25 151/55 (!) 85 %   12/30/15 2100 - 99 24 149/77 (!) 86 %   12/30/15 2030 - 97 22 187/55 90 %   12/30/15 2000 - 95 24 (!) 187/91 90 %   12/30/15 1930 - 91 21 174/74 (!) 89 %   12/30/15 1900 - (!) 109 12 (!) 153/119 (!) 72 %   12/30/15 1830 - 96 19 179/90 97 %       Intake/Output Summary (Last 24 hours) at 12/31/15 1815  Last data filed at 12/31/15 1659   Gross per 24 hour   Intake              230 ml   Output             2125 ml   Net            -1895 ml        PHYSICAL EXAM:   General: WD, WN. Alert, cooperative, no acute distress, morbid obesity noted +????  EENT:  EOMI. Anicteric sclerae. MMM  Resp:  Distant BS noted +  No accessory muscle use  CV:  Regular  rhythm,?? No edema  GI:  Soft, Non distended, Non tender. ??+Bowel sounds  Neurologic:?? Alert and oriented X 3, normal speech,   Psych:???? Good insight.??Not anxious nor agitated  Skin:  No rashes.  No jaundice    Reviewed most current lab test results and cultures  YES  Reviewed most current radiology test results   YES  Review and summation of old records today    NO  Reviewed patient's current orders and MAR    YES  PMH/SH reviewed - no change compared to H&P  ________________________________________________________________________  Care Plan discussed with:    Comments   Patient x    Family      RN x    Care Manager     Consultant                        Multidiciplinary team rounds were held today with case manager, nursing, pharmacist and clinical  coordinator.  Patient's plan of care was discussed; medications were reviewed and discharge planning was addressed.     ________________________________________________________________________  Total NON critical care TIME:  36   Minutes    Total CRITICAL CARE TIME Spent:   Minutes non procedure based      Comments   >50% of visit spent in counseling and coordination of care     ________________________________________________________________________  Carmine Savoy, MD     Procedures: see electronic medical records for all procedures/Xrays and details which were not copied into this note but were reviewed prior to creation of Plan.      LABS:  I reviewed today's most current labs and imaging studies.  Pertinent labs include:  Recent Labs      12/30/15   1403   WBC  9.9   HGB  11.4*   HCT  36.8   PLT  254     Recent Labs      12/30/15   1403   NA  139   K  3.5   CL  99   CO2  34*   GLU  202*   BUN  18   CREA  0.91   CA  8.0*   MG  1.8   ALB  3.0*   TBILI  1.2*   SGOT  15   ALT  17    INR  1.1       Signed: Carmine Savoy, MD

## 2015-12-31 NOTE — Progress Notes (Signed)
PCU SHIFT NURSING NOTE      Bedside and Verbal shift change report given to Lequita Halt (Cabin crew) by Sue Lush (offgoing nurse). Report included the following information SBAR, Kardex, Procedure Summary, MAR, Recent Results and Med Rec Status.    Shift Summary: 7305    0800: Patient taken off ventimask and placed on 6 liters nasal cannula to eat breakfast. Oxygen saturations from 85%-91% at this time. Patient with no signs of respiratory distress, no complaints of shortness of breath at this time.  1000: Physical therapy working with patient, up to recliner with assistance. Patient continues to be on 6 liters nasal cannula with oxygen saturations at 87-89% while sitting up.       Admission Date 12/30/2015   Admission Diagnosis Pneumonia   Consults None        Consults   PT   OT   Speech   Case Management       Palliative      Cardiac Monitoring Order   Yes   No     IV drips   Yes    Drip:                            Dose:  Drip:                            Dose:  Drip:                            Dose:   No     GI Prophylaxis   Yes   No         DVT Prophylaxis   SCDs:             Ted stockings:          Medication   Contraindicated   None      Activity Level Activity Level: Up with Assistance     Activity Assistance: Partial (one person)   Purposeful Rounding every 1-2 hour?   Yes   Schmidt Score  Total Score: 2   Bed Alarm (If score 3 or >)   Yes    Refused (See signed refusal form in chart)   Braden Score  Braden Score: 17   Braden Score (if score 14 or less)   PMT consult   Wound Care consult      Specialty bed    Nutrition consult          Needs prior to discharge:   Home O2 required:    Yes   No    If yes, how much O2 required?    Other:    Last Bowel Movement: Last Bowel Movement Date: 12/30/15      Influenza Vaccine Received Flu Vaccine for Current Season (usually Sept-March): Not Flu Season        Pneumonia Vaccine           Diet Active Orders   Diet     DIET DIABETIC WITH OPTIONS Consistent Carb 1800kcal; Regular      LDAs               Peripheral IV 12/30/15 Left Antecubital (Active)   Site Assessment Clean, dry, & intact 12/31/2015  3:21 AM   Phlebitis Assessment 0 12/31/2015  3:21 AM   Infiltration Assessment 0 12/31/2015  3:21 AM   Dressing Status Clean, dry, &  intact 12/31/2015  3:21 AM   Dressing Type Transparent;Tape 12/31/2015  3:21 AM   Hub Color/Line Status Green;Flushed;Patent;Infusing 12/31/2015  3:21 AM                      Urinary Catheter      Intake & Output   Date 12/30/15 0700 - 12/31/15 0659 12/31/15 0700 - 01/01/16 0659   Shift 0700-1859 1900-0659 24 Hour Total 0700-1859 1900-0659 24 Hour Total   I  N  T  A  K  E   P.O.    180  180      P.O.    180  180    I.V.  (mL/kg/hr)  50  (0) 50  (0)         Volume (cefTRIAXone (ROCEPHIN) 1 g in 0.9% sodium chloride (MBP/ADV) 50 mL)  50 50       Shift Total  (mL/kg)  50  (0.3) 50  (0.3) 180  (1.2)  180  (1.2)   O  U  T  P  U  T   Urine  (mL/kg/hr)  650  (0.4) 650  (0.2) 250  250      Urine Voided  650 650 250  250    Shift Total  (mL/kg)  650  (4.5) 650  (4.5) 250  (1.7)  250  (1.7)   NET  -600 -600 -70  -70   Weight (kg) 142.4 145.5 145.5 145.5 145.5 145.5         Readmission Risk Assessment Tool Score High Risk            22       Total Score        3 Has Seen PCP in Last 6 Months (Yes=3, No=0)    4 IP Visits Last 12 Months (1-3=4, 4=9, >4=11)    9 Pt. Coverage (Medicare=5 , Medicaid, or Self-Pay=4)    6 Charlson Comorbidity Score (Age + Comorbid Conditions)        Criteria that do not apply:    Married. Living with Significant Other. Assisted Living. LTAC. SNF. or   Rehab    Patient Length of Stay (>5 days = 3)       Expected Length of Stay - - -   Actual Length of Stay 1

## 2015-12-31 NOTE — Progress Notes (Addendum)
PCU SHIFT NURSING NOTE      Bedside and Verbal shift change report given to Ronita Hipps, RN (oncoming nurse) by Cathlean Sauer, RN (offgoing nurse). Report included the following information SBAR, Intake/Output, Recent Results and Cardiac Rhythm SR/ST.    Shift Summary:   0321- Pt arrived to room via stretcher, alert and oriented x4, on ventimask at 50% with O2 saturation between 79-85% (MD made aware of oxygen sats prior to transfer). Pt does not appear to be in any acute distress, denies any SOB as of right now. Stated that at home she normally run in the 80's on 4 liters nasal cannula. Lungs sounds currently diminished upon auscultation. All other VSS at this time. Pt denies any pain. Skin assessment completed. Puriwick changed.     Primary Nurse Doristine Section, RN and Stanton Kidney, RN performed a dual skin assessment on this patient No impairment noted  Braden score is 16    0420- Attempted to draw morning labs on pt, pt stated that we only had one attempt to get blood and attempt was unsuccessful. Per pt request we will wait to attempt lab draw a little later in the morning.     Admission Date 12/30/2015   Admission Diagnosis Pneumonia   Consults None        Consults   PT   OT   Speech   Case Management       Palliative      Cardiac Monitoring Order   Yes   No     IV drips   Yes    Drip:                            Dose:  Drip:                            Dose:  Drip:                            Dose:   No     GI Prophylaxis   Yes   No         DVT Prophylaxis   SCDs:             Ted stockings:          Medication   Contraindicated   None      Activity Level Activity Level: Bed Rest     Activity Assistance: Partial (two people)   Purposeful Rounding every 1-2 hour?   Yes   Schmidt Score  Total Score: 2   Bed Alarm (If score 3 or >)   Yes    Refused (See signed refusal form in chart)   Braden Score  Braden Score: 16   Braden Score (if score 14 or less)   PMT consult   Wound Care consult      Specialty bed     Nutrition consult          Needs prior to discharge:   Home O2 required:    Yes   No    If yes, how much O2 required?    Other:    Last Bowel Movement: Last Bowel Movement Date: 12/30/15      Influenza Vaccine Received Flu Vaccine for Current Season (usually Sept-March): Not Flu Season        Pneumonia Vaccine  Diet Active Orders   Diet    DIET DIABETIC WITH OPTIONS Consistent Carb 1800kcal; Regular      LDAs               Peripheral IV 12/30/15 Left Antecubital (Active)   Site Assessment Clean, dry, & intact 12/31/2015  3:21 AM   Phlebitis Assessment 0 12/31/2015  3:21 AM   Infiltration Assessment 0 12/31/2015  3:21 AM   Dressing Status Clean, dry, & intact 12/31/2015  3:21 AM   Dressing Type Transparent;Tape 12/31/2015  3:21 AM   Hub Color/Line Status Green;Flushed;Patent;Infusing 12/31/2015  3:21 AM                      Urinary Catheter      Intake & Output   Date 12/30/15 0700 - 12/31/15 0659 12/31/15 0700 - 01/01/16 0659   Shift 0700-1859 1900-0659 24 Hour Total 0700-1859 1900-0659 24 Hour Total   I  N  T  A  K  E   I.V.  (mL/kg/hr)  50 50         Volume (cefTRIAXone (ROCEPHIN) 1 g in 0.9% sodium chloride (MBP/ADV) 50 mL)  50 50       Shift Total  (mL/kg)  50  (0.3) 50  (0.3)      O  U  T  P  U  T   Shift Total  (mL/kg)         NET  50 50      Weight (kg) 142.4 145.5 145.5 145.5 145.5 145.5         Readmission Risk Assessment Tool Score High Risk            22       Total Score        3 Has Seen PCP in Last 6 Months (Yes=3, No=0)    4 IP Visits Last 12 Months (1-3=4, 4=9, >4=11)    9 Pt. Coverage (Medicare=5 , Medicaid, or Self-Pay=4)    6 Charlson Comorbidity Score (Age + Comorbid Conditions)        Criteria that do not apply:    Married. Living with Significant Other. Assisted Living. LTAC. SNF. or   Rehab    Patient Length of Stay (>5 days = 3)       Expected Length of Stay - - -   Actual Length of Stay 1

## 2015-12-31 NOTE — Progress Notes (Signed)
Spoke with supervisor in regards to pt status, ok to accept pt. Attempted to call nurse Marchelle Folks in ER back for report, nurse currently not available. Will await on return call.

## 2015-12-31 NOTE — ED Notes (Signed)
TRANSFER - OUT REPORT:    Verbal report given to Sue Lush, RN (name) on Brittney Shelton  being transferred to [facility] (unit) for routine progression of care       Report consisted of patient???s Situation, Background, Assessment and   Recommendations(SBAR).     Information from the following report(s) SBAR, Kardex, ED Summary, MAR, Accordion, Recent Results, Med Rec Status and Cardiac Rhythm NSR was reviewed with the receiving nurse.    Lines:   Peripheral IV 12/30/15 Left Antecubital (Active)        Opportunity for questions and clarification was provided.      Patient transported with:   O2 @ 15 liters via Venturi mask

## 2015-12-31 NOTE — Progress Notes (Signed)
Report received from Cathlean Sauer, Charity fundraiser.

## 2015-12-31 NOTE — Progress Notes (Signed)
Problem: Self Care Deficits Care Plan (Adult)  Goal: *Acute Goals and Plan of Care (Insert Text)  Occupational Therapy Goals:  Initiated 12/31/2015  1. Patient will perform grooming with modified independence within 7 days.   2. Patient will perform lower body dressing with modified independence within 7 days.   3. Patient will perform toileting with modified independence within 7 days.  4. Patient will transfer from toilet with modified independence using the least restrictive device and appropriate durable medical equipment within 7 days.   OCCUPATIONAL THERAPY EVALUATION  Patient: Brittney Shelton [phone] y.o. female)  Date: 12/31/2015  Primary Diagnosis: Pneumonia        Precautions: monitor O2 sats         ASSESSMENT :  Based on the objective data described below, the patient presents with decreased O2 sats at rest and signficantly decreased with activity. Pt felt fatigued and SOB.  Pt has high CO2 today and nursing requesting to keep pt on NC if possible.  CGA for bed mobility this session.  Good seated balance edge of bed and CGA for standing balance short distance to chair with SPC.  Performed lower body dressing (socks) side sitting edge of bed (which is pts baseline).  O2 sats at 80% on 5L O2 at rest. Patient reports she is not SOB and feeling up to it as 80% is her baseline. O2 sats dropped to 59-60% on 5L O2 with donning socks on EOB. Pt is mostly limited by respiratory status.  Pt is performing UB ADLS at a set up level and lower body ADLs at a CGA to min assist level.  Pt was only able to tolerate OOB to chair today. Will perform LB dressing with AE to see if O2 sats are more stable with devices.  Pt has used these devices in the past at rehab but reports not using at home.     Patient will benefit from skilled intervention to address the above impairments.  Patient???s rehabilitation potential is considered to be Fair  Factors which may influence rehabilitation potential include:                None noted                Mental ability/status               Medical condition               Home/family situation and support systems               Safety awareness               Pain tolerance/management               Other:        PLAN :  Recommendations and Planned Interventions:                 Self Care Training                          Therapeutic Activities                 Functional Mobility Training            Cognitive Retraining                 Therapeutic Exercises             Endurance Activities  [X]                Balance Training                   [ ]         Neuromuscular Re-Education  [ ]                Visual/Perceptual Training     [X]    Home Interior and spatial designer  [X]                Patient Education                 [X]         Family Training/Education  [ ]                Other (comment):     Frequency/Duration: Patient will be followed by occupational therapy 4 times a week to address goals.  Discharge Recommendations: Home Health  Further Equipment Recommendations for Discharge: TBD       SUBJECTIVE:   Patient stated ???My oxygen on a good day is 85-87% and on a bad day is 50-70%.???      OBJECTIVE DATA SUMMARY:   HISTORY:   Past Medical History:   Diagnosis Date   ??? Chronic obstructive pulmonary disease (HCC)     ??? Congestive heart failure (HCC)     ??? Diabetes (HCC)     ??? Hypertension     History reviewed. No pertinent surgical history.     Prior Level of Function/Home Situation: ambulated with SPC on 4 liters NC at baseline; performs ADLs and IADLS without assist   Expanded or extensive additional review of patient history:      Home Situation  Home Environment: Private residence  # Steps to Enter: 3  Rails to Enter: Yes  Hand Rails : Left  One/Two Story Residence: One story  Living Alone: No  Support Systems: Family member(s)  Patient Expects to be Discharged to:: Private residence  Current DME Used/Available at Home: Cane, straight, Scientist, physiological, Environmental consultant, rolling   Tub or Shower Type: Tub/Shower combination  [X]   Right hand dominant             [ ]   Left hand dominant     EXAMINATION OF PERFORMANCE DEFICITS:  Cognitive/Behavioral Status:  Neurologic State: Alert  Orientation Level: Oriented X4  Cognition: Follows commands  Perception: Appears intact  Perseveration: No perseveration noted  Safety/Judgement: Awareness of environment;Fall prevention        Hearing:  Auditory  Auditory Impairment: None     Vision/Perceptual:    Tracking: Able to track stimulus in all quadrants w/o difficulty                      Acuity: Within Defined Limits          Range of Motion:     AROM: Generally decreased, functional  PROM: Within functional limits                       Strength:     Strength: Generally decreased, functional                 Coordination:  Coordination: Within functional limits  Fine Motor Skills-Upper: Left Intact;Right Intact    Gross Motor Skills-Upper: Left Intact;Right Intact     Tone & Sensation:     Tone: Normal  Sensation: Intact  Balance:  Sitting: Intact;Without support  Standing: Impaired;With support  Standing - Static: Constant support;Fair  Standing - Dynamic : Fair     Functional Mobility and Transfers for ADLs:  Bed Mobility:  Rolling: Contact guard assistance  Supine to Sit: Contact guard assistance  Scooting: Contact guard assistance     Transfers:  Sit to Stand: Contact guard assistance  Stand to Sit: Contact guard assistance  Bed to Chair: Contact guard assistance  Toilet Transfer : Contact guard assistance (to bedside commode)     ADL Assessment:  Feeding: Independent (after meal items presented)     Oral Facial Hygiene/Grooming: Setup     Bathing: Minimum assistance     Upper Body Dressing: Setup     Lower Body Dressing: Stand-by assistance     Toileting: Minimum assistance                 ADL Intervention and task modifications:  See assessment  Cognitive Retraining  Safety/Judgement: Awareness of environment;Fall prevention         Functional Measure:  Barthel Index:      Bathing: 0  Bladder: 10  Bowels: 10  Grooming: 0  Dressing: 5  Feeding: 10  Mobility: 0  Stairs: 0  Toilet Use: 5  Transfer (Bed to Chair and Back): 10  Total: 50         Barthel and G-code impairment scale:  Percentage of impairment CH  0% CI  1-19% CJ  20-39% CK  40-59% CL  60-79% CM  80-99% CN  100%   Barthel Score 0-100 100 99-80 79-60 59-40 20-39 1-19    0   Barthel Score 0-20 20 17-19 13-16 9-12 5-8 1-4 0      The Barthel ADL Index: Guidelines  1. The index should be used as a record of what a patient does, not as a record of what a patient could do.  2. The main aim is to establish degree of independence from any help, physical or verbal, however minor and for whatever reason.  3. The need for supervision renders the patient not independent.  4. A patient's performance should be established using the best available evidence. Asking the patient, friends/relatives and nurses are the usual sources, but direct observation and common sense are also important. However direct testing is not needed.  5. Usually the patient's performance over the preceding 24-48 hours is important, but occasionally longer periods will be relevant.  6. Middle categories imply that the patient supplies over 50 per cent of the effort.  7. Use of aids to be independent is allowed.     Clarisa Kindred., Barthel, D.W. [phone]). Functional evaluation: the Barthel Index. Md State Med J (14)2.  Zenaida Niece [place], J.J.M.F, [place], Ian Malkin., Margret Chance., [place], [place]. (1999). Measuring the change indisability after inpatient rehabilitation; comparison of the responsiveness of the Barthel Index and Functional Independence Measure. Journal of Neurology, Neurosurgery, and Psychiatry, 66(4), 3024245083.  Dawson Bills, N.J.A, [place],  W.J.M, & [place], M.A. (2004.) Assessment of post-stroke quality of life in cost-effectiveness studies: The usefulness of the Barthel Index and the EuroQoL-5D. Quality of Life  Research, 13, 629-52            G codes:  In compliance with CMS???s Claims Based Outcome Reporting, the following G-code set was chosen for this patient based on their primary functional limitation being treated:     The outcome measure chosen to determine the severity of the functional limitation was the barthel  with a score of 50/100 which was correlated with the impairment scale.      ?? Self Care:              [phone] - CURRENT STATUS:    CK - 40%-59% impaired, limited or restricted              [id number] - GOAL STATUS:           CI - 1%-19% impaired, limited or restricted              T1572 - D/C STATUS:                       ---------------To be determined---------------      Occupational Therapy Evaluation Charge Determination   History Examination Decision-Making   LOW Complexity : Brief history review  MEDIUM Complexity : 3-5 performance deficits relating to physical, cognitive , or psychosocial skils that result in activity limitations and / or participation restrictions MEDIUM Complexity : Patient may present with comorbidities that affect occupational performnce. Miniml to moderate modification of tasks or assistance (eg, physical or verbal ) with assesment(s) is necessary to enable patient to complete evaluation       Based on the above components, the patient evaluation is determined to be of the following complexity level: LOW   Pain:  Pain Scale 1: Numeric (0 - 10)  Pain Intensity 1: 0              Activity Tolerance:      Please refer to the flowsheet for vital signs taken during this treatment.  After treatment:   [X]  Patient left in no apparent distress sitting up in chair  [ ]  Patient left in no apparent distress in bed  [X]  Call bell left within reach  [X]  Nursing notified  [ ]  Caregiver present  [ ]  Bed alarm activated      COMMUNICATION/EDUCATION:   The patient???s plan of care was discussed with: Physical Therapist, Registered Nurse and patient.   [X]  Home safety education was provided and the patient/caregiver indicated understanding.  [X]  Patient have participated as able in goal setting and plan of care.  [X]  Patient agree to work toward stated goals and plan of care.  [ ]  Patient understands intent and goals of therapy, but is neutral about his/her participation.  [ ]  Patient is unable to participate in goal setting and plan of care.  This patient???s plan of care is appropriate for delegation to OTA.     Thank you for this referral.  Joya San, OTR/L

## 2016-01-01 LAB — CBC W/O DIFF
HCT: 35.9 % (ref 35.0–47.0)
HGB: 13.2 g/dL (ref 11.5–16.0)
MCH: 31.2 PG (ref 26.0–34.0)
MCHC: 36.8 g/dL — ABNORMAL HIGH (ref 30.0–36.5)
MCV: 84.9 FL (ref 80.0–99.0)
PLATELET: 145 10*3/uL — ABNORMAL LOW (ref 150–400)
RBC: 4.23 M/uL (ref 3.80–5.20)
RDW: 14.2 % (ref 11.5–14.5)
WBC: 14.7 10*3/uL — ABNORMAL HIGH (ref 3.6–11.0)

## 2016-01-01 LAB — GLUCOSE, POC
Glucose (POC): 182 mg/dL — ABNORMAL HIGH (ref 65–100)
Glucose (POC): 101 mg/dL — ABNORMAL HIGH (ref 65–100)
Glucose (POC): 218 mg/dL — ABNORMAL HIGH (ref 65–100)
Glucose (POC): 250 mg/dL — ABNORMAL HIGH (ref 65–100)

## 2016-01-01 LAB — METABOLIC PANEL, BASIC
Anion gap: 4 mmol/L — ABNORMAL LOW (ref 5–15)
BUN/Creatinine ratio: 15 (ref 12–20)
BUN: 31 MG/DL — ABNORMAL HIGH (ref 6–20)
CO2: 29 mmol/L (ref 21–32)
Calcium: 7.9 MG/DL — ABNORMAL LOW (ref 8.5–10.1)
Chloride: 115 mmol/L — ABNORMAL HIGH (ref 97–108)
Creatinine: 2.07 MG/DL — ABNORMAL HIGH (ref 0.55–1.02)
GFR est AA: 29 mL/min/{1.73_m2} — ABNORMAL LOW (ref >60)
GFR est non-AA: 24 mL/min/{1.73_m2} — ABNORMAL LOW (ref >60)
Glucose: 130 mg/dL — ABNORMAL HIGH (ref 65–100)
Potassium: 3.9 mmol/L (ref 3.5–5.1)
Sodium: 148 mmol/L — ABNORMAL HIGH (ref 136–145)

## 2016-01-01 MED ORDER — FUROSEMIDE 8 MG/ML ORAL SOLN
40 mg/5 mL (8 mg/mL) | Freq: Every day | ORAL | Status: DC
Start: 2016-01-01 — End: 2016-01-01

## 2016-01-01 MED ORDER — SODIUM CHLORIDE 0.45 % IV
0.45 % | INTRAVENOUS | Status: AC
Start: 2016-01-01 — End: 2016-01-02
  Administered 2016-01-01: 21:00:00 via INTRAVENOUS

## 2016-01-01 MED ORDER — FUROSEMIDE 40 MG/4 ML ORAL SOLUTION
40 mg/4 mL | Freq: Every day | ORAL | Status: DC
Start: 2016-01-01 — End: 2016-01-02
  Administered 2016-01-02: 15:00:00 via ORAL

## 2016-01-01 MED FILL — TOPROL XL 50 MG TABLET,EXTENDED RELEASE: 50 mg | ORAL | Qty: 4

## 2016-01-01 MED FILL — MUCINEX 600 MG TABLET, EXTENDED RELEASE: 600 mg | ORAL | Qty: 1

## 2016-01-01 MED FILL — VITAMIN D3 25 MCG (1,000 UNIT) TABLET: 25 mcg (1,000 unit) | ORAL | Qty: 5

## 2016-01-01 MED FILL — IPRATROPIUM-ALBUTEROL 2.5 MG-0.5 MG/3 ML NEB SOLUTION: 2.5 mg-0.5 mg/3 ml | RESPIRATORY_TRACT | Qty: 3

## 2016-01-01 MED FILL — BD POSIFLUSH NORMAL SALINE 0.9 % INJECTION SYRINGE: INTRAMUSCULAR | Qty: 30

## 2016-01-01 MED FILL — MAPAP (ACETAMINOPHEN) 325 MG TABLET: 325 mg | ORAL | Qty: 2

## 2016-01-01 MED FILL — CLONIDINE 0.1 MG TAB: 0.1 mg | ORAL | Qty: 1

## 2016-01-01 MED FILL — NITRO-BID 2 % TRANSDERMAL OINTMENT: 2 % | TRANSDERMAL | Qty: 1

## 2016-01-01 MED FILL — BD POSIFLUSH NORMAL SALINE 0.9 % INJECTION SYRINGE: INTRAMUSCULAR | Qty: 10

## 2016-01-01 MED FILL — AMLODIPINE 5 MG TAB: 5 mg | ORAL | Qty: 2

## 2016-01-01 MED FILL — CALCIUM CARBONATE 500 MG (1250 MG) TAB: 500 mg calcium (1,250 mg) | ORAL | Qty: 1

## 2016-01-01 MED FILL — INSULIN LISPRO 100 UNIT/ML INJECTION: 100 unit/mL | SUBCUTANEOUS | Qty: 1

## 2016-01-01 MED FILL — GLIMEPIRIDE 1 MG TAB: 1 mg | ORAL | Qty: 4

## 2016-01-01 MED FILL — CEFTRIAXONE 1 GRAM SOLUTION FOR INJECTION: 1 gram | INTRAMUSCULAR | Qty: 1

## 2016-01-01 MED FILL — AZITHROMYCIN 500 MG IV SOLUTION: 500 mg | INTRAVENOUS | Qty: 5

## 2016-01-01 MED FILL — ONDANSETRON (PF) 4 MG/2 ML INJECTION: 4 mg/2 mL | INTRAMUSCULAR | Qty: 2

## 2016-01-01 MED FILL — LISINOPRIL 20 MG TAB: 20 mg | ORAL | Qty: 2

## 2016-01-01 MED FILL — BD POSIFLUSH NORMAL SALINE 0.9 % INJECTION SYRINGE: INTRAMUSCULAR | Qty: 20

## 2016-01-01 MED FILL — PRAVASTATIN 10 MG TAB: 10 mg | ORAL | Qty: 2

## 2016-01-01 MED FILL — LOVENOX 40 MG/0.4 ML SUBCUTANEOUS SYRINGE: 40 mg/0.4 mL | SUBCUTANEOUS | Qty: 0.4

## 2016-01-01 MED FILL — FUROSEMIDE 40 MG/4 ML ORAL SOLUTION: 40 mg/4 mL | ORAL | Qty: 4

## 2016-01-01 MED FILL — FUROSEMIDE 10 MG/ML IJ SOLN: 10 mg/mL | INTRAMUSCULAR | Qty: 4

## 2016-01-01 MED FILL — CHILDREN'S ASPIRIN 81 MG CHEWABLE TABLET: 81 mg | ORAL | Qty: 1

## 2016-01-01 MED FILL — SODIUM CHLORIDE 0.45 % IV: 0.45 % | INTRAVENOUS | Qty: 1000

## 2016-01-01 MED FILL — PREDNISONE 20 MG TAB: 20 mg | ORAL | Qty: 2

## 2016-01-01 NOTE — Progress Notes (Signed)
Bedside shift change report given to Toby RN (oncoming nurse) by C husson RN (offgoing nurse). Report included the following information SBAR, Kardex, Procedure Summary, Intake/Output, MAR, Recent Results and Cardiac Rhythm Lines.

## 2016-01-01 NOTE — Progress Notes (Signed)
0430am- Attempted to draw morning labs on patient. After one unsuccessful attempt she is refusing to be stuck again. States they "couldnt get her yesterday" . Patient is a very difficult stick, even peripheral IV had to be placed via ultrasound.  WIll try again later.

## 2016-01-01 NOTE — Progress Notes (Signed)
Bedside shift change report given to [facility] (oncoming nurse) by Bertram Denver, RN (offgoing nurse). Report included the following information SBAR, Kardex, Intake/Output, MAR, Recent Results and Cardiac Rhythm NSR.

## 2016-01-01 NOTE — Progress Notes (Signed)
PCU SHIFT NURSING NOTE      Bedside shift change report given to Bertram Denver, RN and August Saucer, RN (oncoming nurse) by Denny Peon RN (offgoing nurse). Report included the following information SBAR, Kardex, Intake/Output, MAR, Recent Results, Med Rec Status and Cardiac Rhythm NSR.    Shift Summary:       Admission Date 12/30/2015   Admission Diagnosis Pneumonia   Consults None        Consults   PT   OT   Speech   Case Management       Palliative      Cardiac Monitoring Order   Yes   No     IV drips   Yes    Drip:                            Dose:  Drip:                            Dose:  Drip:                            Dose:   No     GI Prophylaxis   Yes   No         DVT Prophylaxis   SCDs:             Ted stockings:          Medication   Contraindicated   None      Activity Level Activity Level: Up with Assistance     Activity Assistance: Partial (two people)   Purposeful Rounding every 1-2 hour?   Yes   Schmidt Score  Total Score: 3   Bed Alarm (If score 3 or >)   Yes    Refused (See signed refusal form in chart)   Braden Score  Braden Score: 18   Braden Score (if score 14 or less)   PMT consult   Wound Care consult      Specialty bed    Nutrition consult          Needs prior to discharge:   Home O2 required:    Yes   No    If yes, how much O2 required? 4L    Other:    Last Bowel Movement: Last Bowel Movement Date: 12/30/15      Influenza Vaccine Received Flu Vaccine for Current Season (usually Sept-March): Not Flu Season        Pneumonia Vaccine           Diet Active Orders   Diet    DIET DIABETIC WITH OPTIONS Consistent Carb 1800kcal; Regular      LDAs               Peripheral IV 12/30/15 Left Antecubital (Active)   Site Assessment Clean, dry, & intact 01/01/2016  4:57 AM   Phlebitis Assessment 0 01/01/2016  4:57 AM   Infiltration Assessment 0 01/01/2016  4:57 AM   Dressing Status Clean, dry, & intact 01/01/2016  4:57 AM   Dressing Type Transparent 12/31/2015  2:48 PM    Hub Color/Line Status Green;Flushed 12/31/2015  2:48 PM                      Urinary Catheter      Intake & Output   Date 12/31/15 0700 -  01/01/16 0659 01/01/16 0700 - 01/02/16 0659   Shift 0700-1859 1900-0659 24 Hour Total 0700-1859 1900-0659 24 Hour Total   I  N  T  A  K  E   P.O. 180  180         P.O. 180  180       Shift Total  (mL/kg) 180  (1.2)  180  (1.2)      O  U  T  P  U  T   Urine  (mL/kg/hr) 1475  (0.8)  1475  (0.4)         Urine Voided 1475  1475       Shift Total  (mL/kg) 1475  (10.1)  1475  (10.2)      NET -1295  -1295      Weight (kg) 145.5 145.2 145.2 145.2 145.2 145.2         Readmission Risk Assessment Tool Score High Risk            22       Total Score        3 Has Seen PCP in Last 6 Months (Yes=3, No=0)    4 IP Visits Last 12 Months (1-3=4, 4=9, >4=11)    9 Pt. Coverage (Medicare=5 , Medicaid, or Self-Pay=4)    6 Charlson Comorbidity Score (Age + Comorbid Conditions)        Criteria that do not apply:    Married. Living with Significant Other. Assisted Living. LTAC. SNF. or   Rehab    Patient Length of Stay (>5 days = 3)       Expected Length of Stay 2d 19h   Actual Length of Stay 2

## 2016-01-01 NOTE — Progress Notes (Signed)
PCU SHIFT NURSING NOTE      Bedside shift change report given to Whitney RN and Irving Burton RN (oncoming nurse) by Desmond Dike (offgoing nurse). Report included the following information SBAR, Kardex, Intake/Output, Recent Results and Cardiac Rhythm NSR.    Shift Summary:  1930: Bedside report given to night shift nurse. No complaints of pain at this time. Will continue to monitor           Admission Date 12/30/2015   Admission Diagnosis Pneumonia   Consults None        Consults   PT   OT   Speech   Case Management       Palliative      Cardiac Monitoring Order   Yes   No     IV drips   Yes    Drip:                            Dose:  Drip:                            Dose:  Drip:                            Dose:   No     GI Prophylaxis   Yes   No         DVT Prophylaxis   SCDs:             Ted stockings:          Medication   Contraindicated   None      Activity Level Activity Level: Up with Assistance     Activity Assistance: Partial (two people)   Purposeful Rounding every 1-2 hour?   Yes   Schmidt Score  Total Score: 3   Bed Alarm (If score 3 or >)   Yes    Refused (See signed refusal form in chart)   Braden Score  Braden Score: 18   Braden Score (if score 14 or less)   PMT consult   Wound Care consult      Specialty bed    Nutrition consult          Needs prior to discharge:   Home O2 required:    Yes   No    If yes, how much O2 required?    Other:    Last Bowel Movement: Last Bowel Movement Date: 01/01/16      Influenza Vaccine Received Flu Vaccine for Current Season (usually Sept-March): Not Flu Season        Pneumonia Vaccine           Diet Active Orders   Diet    DIET DIABETIC WITH OPTIONS Consistent Carb 1800kcal; Regular      LDAs               Peripheral IV 12/30/15 Left Antecubital (Active)   Site Assessment Clean, dry, & intact 01/01/2016  7:43 AM   Phlebitis Assessment 0 01/01/2016  7:43 AM   Infiltration Assessment 0 01/01/2016  7:43 AM   Dressing Status Clean, dry, & intact 01/01/2016  7:43 AM    Dressing Type Transparent 01/01/2016  7:43 AM   Hub Color/Line Status Flushed;End cap changed 01/01/2016  7:43 AM   Alcohol Cap Used Yes 01/01/2016  7:43 AM  Urinary Catheter      Intake & Output   Date 12/31/15 1900 - 01/01/16 0659 01/01/16 0700 - 01/02/16 0659   Shift 1900-0659 24 Hour Total 0700-1859 1900-0659 24 Hour Total   I  N  T  A  K  E   P.O.  180         P.O.  180       Shift Total  (mL/kg)  180  (1.2)      O  U  T  P  U  T   Urine  (mL/kg/hr)  1475 700  (0.4)  700      Urine Voided  1475 700  700    Shift Total  (mL/kg)  1475  (10.2) 700  (4.8)  700  (4.8)   NET  -1295 -700  -700   Weight (kg) 145.2 145.2 145.2 145.2 145.2         Readmission Risk Assessment Tool Score High Risk            22       Total Score        3 Has Seen PCP in Last 6 Months (Yes=3, No=0)    4 IP Visits Last 12 Months (1-3=4, 4=9, >4=11)    9 Pt. Coverage (Medicare=5 , Medicaid, or Self-Pay=4)    6 Charlson Comorbidity Score (Age + Comorbid Conditions)        Criteria that do not apply:    Married. Living with Significant Other. Assisted Living. LTAC. SNF. or   Rehab    Patient Length of Stay (>5 days = 3)       Expected Length of Stay 3d 19h   Actual Length of Stay 2

## 2016-01-01 NOTE — Progress Notes (Signed)
Hospitalist Progress Note    NAME: Brittney Shelton   DOB:  12/15/1950   MRN:  [id number]       Assessment / Plan:  Acute on chronic hypoxic respiratory failure POA - close to baseline Oxygen today 4-5 L/m via NC  due to RUL Pneumonia POA - on CTA chest  Chronic diastolic dysfunction HF EF 60% POA- seems to be compensated  Mild acute COPD exacerbation POA   CTA: There is no evidence of pulmonary embolism. Small bilateral effusions with underlying atelectasis. Right upper lobe consolidation    Cont oxygen via NC, avoid NRB mask, bipap prn only, taper to home 4L/m as able  WILL hold lasix IV due to rising sodium and creatinine + scheduled NTP  C/w CTX + Zithromax for CAP  Cont prednisone PO for COPD + scheduled jet nebs   Follow Blood Cx   Daily weight   Add mucinex    AKI and Hypernatremia  Will hold lasix for now and order iv fluid 1/2 ns x 1 liter.Then follow lab in am.  ??  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  HTN  Continue home meds   Holding Imdur while on NTP   ????  Hyperlipidemia, cont statin   Lymphedema  Morbid obesity, Body mass index is 50.68 kg/(m^2).  ??  ??  ??  ??  ??  ??  Code Status: DNR as per pt's wishes today   Surrogate Decision Maker: brother and niece   ??  DVT Prophylaxis: lovenox   GI Prophylaxis: not indicated  ??  Baseline: lives with brother and her niece, no children, ambulating with cane; home O2 4 L   Recommended Disposition: Home w/Family + HH PT/OT likely     Subjective:     Chief Complaint / Reason for Physician Visit: F/u SOB, Cough  "I feel somewhat better".  Discussed with RN events overnight.     Review of Systems:  Symptom Y/N Comments  Symptom Y/N Comments   Fever/Chills n   Chest Pain n    Poor Appetite n   Edema n    Cough y   Abdominal Pain n    Sputum y   Joint Pain n    SOB/DOE y improved  Pruritis/Rash     Nausea/vomit    Tolerating PT/OT y     Diarrhea    Tolerating Diet y    Constipation    Other       Could NOT obtain due to:      Objective:     VITALS:   Last 24hrs VS reviewed since prior progress note. Most recent are:  Patient Vitals for the past 24 hrs:   Temp Pulse Resp BP SpO2   01/01/16 1545 - 73 20 136/65 95 %   01/01/16 1541 - - - - 93 %   01/01/16 1141 98.2 ??F (36.8 ??C) 71 18 113/69 91 %   01/01/16 1118 - - - - 93 %   01/01/16 0743 98.2 ??F (36.8 ??C) 79 18 129/70 93 %   01/01/16 0724 - - - - 93 %   01/01/16 0442 - - - - 92 %   01/01/16 0400 97.8 ??F (36.6 ??C) 67 18 156/76 94 %   01/01/16 0008 98.1 ??F (36.7 ??C) 74 18 138/70 93 %   12/31/15 2344 - - - -  93 %   12/31/15 2205 - - - - 93 %   12/31/15 2013 - - - - 94 %   12/31/15 1948 98.1 ??F (36.7 ??C) 71 18 130/65 93 %       Intake/Output Summary (Last 24 hours) at 01/01/16 1635  Last data filed at 12/31/15 1659   Gross per 24 hour   Intake                0 ml   Output              625 ml   Net             -625 ml        PHYSICAL EXAM:  General: WD, WN. Alert, cooperative, no acute distress, morbid obesity noted +????  EENT:  EOMI. Anicteric sclerae. MMM  Resp:  Distant BS noted +  No accessory muscle use  CV:  Regular  rhythm,?? No edema  GI:  Soft, Non distended, Non tender. ??+Bowel sounds  Neurologic:?? Alert and oriented X 3, normal speech,   Psych:???? Good insight.??Not anxious nor agitated  Skin:  No rashes.  No jaundice    Reviewed most current lab test results and cultures  YES  Reviewed most current radiology test results   YES  Review and summation of old records today    NO  Reviewed patient's current orders and MAR    YES  PMH/SH reviewed - no change compared to H&P  ________________________________________________________________________  Care Plan discussed with:    Comments   Patient x    Family      RN x    Care Manager     Consultant                        Multidiciplinary team rounds were held today with case manager, nursing, pharmacist and Higher education careers adviser.  Patient's plan of  care was discussed; medications were reviewed and discharge planning was addressed.     ________________________________________________________________________  Total NON critical care TIME:  25   Minutes    Total CRITICAL CARE TIME Spent:   Minutes non procedure based      Comments   >50% of visit spent in counseling and coordination of care     ________________________________________________________________________  Darnelle Catalan, MD     Procedures: see electronic medical records for all procedures/Xrays and details which were not copied into this note but were reviewed prior to creation of Plan.      LABS:  I reviewed today's most current labs and imaging studies.  Pertinent labs include:  Recent Labs      01/01/16   0353  12/30/15   1403   WBC  14.7*  9.9   HGB  13.2  11.4*   HCT  35.9  36.8   PLT  145*  254     Recent Labs      01/01/16   0353  12/30/15   1403   NA  148*  139   K  3.9  3.5   CL  115*  99   CO2  29  34*   GLU  130*  202*   BUN  31*  18   CREA  2.07*  0.91   CA  7.9*  8.0*   MG   --   1.8   ALB   --   3.0*   TBILI   --   1.2*  SGOT   --   15   ALT   --   17   INR   --   1.1       Signed: Darnelle Catalan, MD

## 2016-01-01 NOTE — Progress Notes (Signed)
Spiritual Care Partner Volunteer visited patient on 01/01/2016.  Documented by:   Rev. Halynn Reitano L. Dockum, D.Min, MA, BCC    Lead Chaplain Profession Development & Advancement

## 2016-01-01 NOTE — Progress Notes (Addendum)
Problem: Self Care Deficits Care Plan (Adult)  Goal: *Acute Goals and Plan of Care (Insert Text)  Occupational Therapy Goals:  Initiated 12/31/2015  1. Patient will perform grooming with modified independence within 7 days.   2. Patient will perform lower body dressing with modified independence within 7 days.   3. Patient will perform toileting with modified independence within 7 days.  4. Patient will transfer from toilet with modified independence using the least restrictive device and appropriate durable medical equipment within 7 days.    OCCUPATIONAL THERAPY TREATMENT  Patient: Brittney Shelton [phone] y.o. female)  Date: 01/01/2016  Diagnosis: Pneumonia Pneumonia       Precautions:        ASSESSMENT:  Patient pleasant and agreeable to participate in therapy. Educated pt on energy conservation techniques and provided pt with a handout. She was receptive to education, verbalized understanding and was appreciative. She reported that her family member has a shower chair that she could borrow, and plans to arrange to use it at home. Educated pt on use of LB adaptive equipment for dressing and bathing. Pt performed LB dressing task with supervision and verbal cues using equipment. Pt's O2 sats today measured 88% at rest. During dressing task with adaptive equipment, pt's O2 sats remained 81-83% (compared to 60% in previous session without equipment). She recalls receiving some adaptive equipment from previous inpatient rehab stay, and will call family members to find them or is interested in purchasing a hip kit. Pt will benefit from continued OT to further address use of adaptive equipment and increase participation in ADLs. Recommend HHOT and 24/7 supervision upon discharge.  Progression toward goals:  [X]       Improving appropriately and progressing toward goals  [ ]       Improving slowly and progressing toward goals  [ ]       Not making progress toward goals and plan of care will be adjusted       PLAN:   Patient continues to benefit from skilled intervention to address the above impairments.  Continue treatment per established plan of care.  Discharge Recommendations:  Home Health  Further Equipment Recommendations for Discharge:  LB adaptive equipment, shower chair       SUBJECTIVE:   Patient stated ???I think I remember getting some of this at [facility].???      OBJECTIVE DATA SUMMARY:   Cognitive/Behavioral Status:  Neurologic State: Alert  Orientation Level: Oriented X4  Cognition: Follows commands;Appropriate for age attention/concentration  Perception: Appears intact  Perseveration: No perseveration noted  Safety/Judgement: Awareness of environment;Fall prevention;Home safety     Functional Mobility and Transfers for ADLs:  Bed Mobility:   Pt received in chair, remained in chair for duration of session.     Transfers:              Pt received in chair, remained in chair for duration of session.        Balance:  Sitting: Intact;Without support  Standing: Impaired;With support  Standing - Static: Constant support;Fair  Standing - Dynamic : Fair     ADL Intervention:   Instructed pt on use of reacher, sock aid, long handle shoe horn, dressing stick and long handle sponge to increase I with LB ADLs. Pt donned/doffed socks with supervision and verbal cues using reacher and sock aid. O2 sats remained 81-83% while used equipment. Educated pt on energy conservation techniques such as pacing, planning ahead, taking rest breaks, and sitting during tasks. Provided pt  with handout.           Lower Body Dressing Assistance  Dressing Assistance: Supervision/set up  Socks: Supervision/set-up           Cognitive Retraining  Safety/Judgement: Awareness of environment;Fall prevention;Home safety     Pain:  Pain Scale 1: Numeric (0 - 10)  Pain Intensity 1: 6  Pain Location 1: Chest (pleuritic)  Pain Orientation 1: Anterior  Pain Description 1: Aching  Pain Intervention(s) 1: Medication (see MAR)  Activity Tolerance:   Good   Please refer to the flowsheet for vital signs taken during this treatment.  After treatment:   [X] Patient left in no apparent distress sitting up in chair  [ ] Patient left in no apparent distress in bed  [X] Call bell left within reach  [X] Nursing notified  [ ] Caregiver present   [ ] Bed alarm activated      COMMUNICATION/COLLABORATION:   The patient???s plan of care was discussed with: Physical Therapist and Registered Nurse     Regis Bill, OTLA  Time Calculation: 30 mins

## 2016-01-01 NOTE — Progress Notes (Signed)
Problem: Mobility Impaired (Adult and Pediatric)  Goal: *Acute Goals and Plan of Care (Insert Text)  Physical Therapy Goals  Initiated 12/31/2015  1. Patient will move from supine to sit and sit to supine , scoot up and down and roll side to side in bed with independence within 7 day(s).   2. Patient will transfer from bed to chair and chair to bed with modified independence using the least restrictive device within 7 day(s).  3. Patient will perform sit to stand with modified independence within 7 day(s).  4. Patient will ambulate with modified independence for 100 feet with the least restrictive device within 7 day(s).   5. Patient will ascend/descend 3 stairs with one sided handrail(s) with supervision/set-up within 7 day(s).   PHYSICAL THERAPY TREATMENT  Patient: Brittney Shelton [phone] y.o. female)  Date: 01/01/2016  Diagnosis: Pneumonia Pneumonia       Precautions:        ASSESSMENT:  Patient received sitting in the chair, agreeable for PT. Patient's O2 sats at 95% on 6L O2 at rest. Patient able to stand with CGA and SPC. Patient ambulated 40 feet with CGA and SPC. Patient with increased trunk sway, exerting a lot of energy, with O2 sats dropping to 70% on 6L O2. Patient with mildly unsteady gait although no LOB noted. Educated patient on energy conservation by utilizing RW instead of SPC due to extra trunk sway, exerting energy. Patient agreeable to try RW next session. Patient left sitting in the chair at the conclusion of PT treatment session with O2 sats improving to 90% with rest. Patient will benefit from [facility] PT at discharge.   Progression toward goals:  [X]     Improving appropriately and progressing toward goals  [ ]     Improving slowly and progressing toward goals  [ ]     Not making progress toward goals and plan of care will be adjusted       PLAN:  Patient continues to benefit from skilled intervention to address the above impairments.  Continue treatment per established plan of care.   Discharge Recommendations:  Home Health  Further Equipment Recommendations for Discharge:  TBD       SUBJECTIVE:   Patient stated ???Yeah we can try that.???      OBJECTIVE DATA SUMMARY:   Critical Behavior:  Neurologic State: Alert  Orientation Level: Oriented X4  Cognition: Follows commands, Appropriate for age attention/concentration  Safety/Judgement: Awareness of environment, Fall prevention, Home safety  Functional Mobility Training:  Bed Mobility:                    Transfers:  Sit to Stand: Contact guard assistance  Stand to Sit: Contact guard assistance        Bed to Chair: Contact guard assistance                    Balance:  Sitting: Intact;Without support  Standing: Impaired;With support  Standing - Static: Constant support;Fair  Standing - Dynamic : Fair  Ambulation/Gait Training:  Distance (ft): 40 Feet (ft)  Assistive Device: Gait belt;Cane, straight  Ambulation - Level of Assistance: Contact guard assistance        Gait Abnormalities: Decreased step clearance;Trunk sway increased        Base of Support: Widened     Speed/Cadence: Pace decreased (<100 feet/min)  Step Length: Right shortened;Left shortened                 Therapeutic Exercises:  PLB cues  Pain:  Pain Scale 1: Numeric (0 - 10)  Pain Intensity 1: 6  Pain Location 1: Chest (pleuritic)  Pain Orientation 1: Anterior  Pain Description 1: Aching  Pain Intervention(s) 1: Medication (see MAR)  Activity Tolerance:   Fair, O2 sats dropped to 70% on 6L O2 with ambulation. Recovered to 90%  Please refer to the flowsheet for vital signs taken during this treatment.  After treatment:       Patient left in no apparent distress sitting up in chair      Patient left in no apparent distress in bed      Call bell left within reach      Nursing notified      Caregiver present      Bed alarm activated      COMMUNICATION/COLLABORATION:   The patient???s plan of care was discussed with: Registered Nurse and Social Worker      Tiffany Leonia Reeves, PT, DPT   Time Calculation: 11 mins

## 2016-01-02 LAB — CBC WITH AUTOMATED DIFF
ABS. BASOPHILS: 0 10*3/uL (ref 0.0–0.1)
ABS. EOSINOPHILS: 0.1 10*3/uL (ref 0.0–0.4)
ABS. LYMPHOCYTES: 1.7 10*3/uL (ref 0.8–3.5)
ABS. MONOCYTES: 0.8 10*3/uL (ref 0.0–1.0)
ABS. NEUTROPHILS: 9.7 10*3/uL — ABNORMAL HIGH (ref 1.8–8.0)
BASOPHILS: 0 % (ref 0–1)
EOSINOPHILS: 1 % (ref 0–7)
HCT: 35.1 % (ref 35.0–47.0)
HGB: 10.8 g/dL — ABNORMAL LOW (ref 11.5–16.0)
LYMPHOCYTES: 14 % (ref 12–49)
MCH: 28.1 PG (ref 26.0–34.0)
MCHC: 30.8 g/dL (ref 30.0–36.5)
MCV: 91.4 FL (ref 80.0–99.0)
MONOCYTES: 6 % (ref 5–13)
NEUTROPHILS: 79 % — ABNORMAL HIGH (ref 32–75)
PLATELET: 264 10*3/uL (ref 150–400)
RBC: 3.84 M/uL (ref 3.80–5.20)
RDW: 17.4 % — ABNORMAL HIGH (ref 11.5–14.5)
WBC: 12.3 10*3/uL — ABNORMAL HIGH (ref 3.6–11.0)

## 2016-01-02 LAB — GLUCOSE, POC
Glucose (POC): 175 mg/dL — ABNORMAL HIGH (ref 65–100)
Glucose (POC): 76 mg/dL (ref 65–100)
Glucose (POC): 70 mg/dL (ref 65–100)
Glucose (POC): 87 mg/dL (ref 65–100)
Glucose (POC): 163 mg/dL — ABNORMAL HIGH (ref 65–100)
Glucose (POC): 183 mg/dL — ABNORMAL HIGH (ref 65–100)

## 2016-01-02 LAB — METABOLIC PANEL, BASIC
Anion gap: 5 mmol/L (ref 5–15)
BUN/Creatinine ratio: 32 — ABNORMAL HIGH (ref 12–20)
BUN: 27 MG/DL — ABNORMAL HIGH (ref 6–20)
CO2: 33 mmol/L — ABNORMAL HIGH (ref 21–32)
Calcium: 7.9 MG/DL — ABNORMAL LOW (ref 8.5–10.1)
Chloride: 99 mmol/L (ref 97–108)
Creatinine: 0.84 MG/DL (ref 0.55–1.02)
GFR est AA: >60 mL/min/{1.73_m2} (ref >60)
GFR est non-AA: >60 mL/min/{1.73_m2} (ref >60)
Glucose: 104 mg/dL — ABNORMAL HIGH (ref 65–100)
Potassium: 3.9 mmol/L (ref 3.5–5.1)
Sodium: 137 mmol/L (ref 136–145)

## 2016-01-02 MED ORDER — IPRATROPIUM-ALBUTEROL 2.5 MG-0.5 MG/3 ML NEB SOLUTION
2.5 mg-0.5 mg/3 ml | Freq: Three times a day (TID) | RESPIRATORY_TRACT | Status: DC
Start: 2016-01-02 — End: 2016-01-03
  Administered 2016-01-02 – 2016-01-03 (×6): via RESPIRATORY_TRACT

## 2016-01-02 MED ORDER — SODIUM CHLORIDE 0.9 % IV PIGGY BACK
500 mg | INTRAVENOUS | Status: DC
Start: 2016-01-02 — End: 2016-01-03
  Administered 2016-01-02 – 2016-01-03 (×2): via INTRAVENOUS

## 2016-01-02 MED ORDER — ADV ADDAPTOR
1 gram | Status: DC
Start: 2016-01-02 — End: 2016-01-03
  Administered 2016-01-02 – 2016-01-03 (×2): via INTRAVENOUS

## 2016-01-02 MED ORDER — FUROSEMIDE 40 MG/4 ML ORAL SOLUTION
40 mg/4 mL | Freq: Two times a day (BID) | ORAL | Status: DC
Start: 2016-01-02 — End: 2016-01-03
  Administered 2016-01-02 – 2016-01-03 (×2): via ORAL

## 2016-01-02 MED FILL — INSULIN LISPRO 100 UNIT/ML INJECTION: 100 unit/mL | SUBCUTANEOUS | Qty: 1

## 2016-01-02 MED FILL — CEFTRIAXONE 1 GRAM SOLUTION FOR INJECTION: 1 gram | INTRAMUSCULAR | Qty: 1

## 2016-01-02 MED FILL — PRAVASTATIN 10 MG TAB: 10 mg | ORAL | Qty: 2

## 2016-01-02 MED FILL — AZITHROMYCIN 500 MG IV SOLUTION: 500 mg | INTRAVENOUS | Qty: 5

## 2016-01-02 MED FILL — MUCINEX 600 MG TABLET, EXTENDED RELEASE: 600 mg | ORAL | Qty: 1

## 2016-01-02 MED FILL — IPRATROPIUM-ALBUTEROL 2.5 MG-0.5 MG/3 ML NEB SOLUTION: 2.5 mg-0.5 mg/3 ml | RESPIRATORY_TRACT | Qty: 3

## 2016-01-02 MED FILL — NITRO-BID 2 % TRANSDERMAL OINTMENT: 2 % | TRANSDERMAL | Qty: 1

## 2016-01-02 MED FILL — BD POSIFLUSH NORMAL SALINE 0.9 % INJECTION SYRINGE: INTRAMUSCULAR | Qty: 30

## 2016-01-02 MED FILL — TOPROL XL 50 MG TABLET,EXTENDED RELEASE: 50 mg | ORAL | Qty: 4

## 2016-01-02 MED FILL — BD POSIFLUSH NORMAL SALINE 0.9 % INJECTION SYRINGE: INTRAMUSCULAR | Qty: 10

## 2016-01-02 MED FILL — VITAMIN D3 25 MCG (1,000 UNIT) TABLET: 25 mcg (1,000 unit) | ORAL | Qty: 5

## 2016-01-02 MED FILL — FUROSEMIDE 40 MG/4 ML ORAL SOLUTION: 40 mg/4 mL | ORAL | Qty: 4

## 2016-01-02 MED FILL — LOVENOX 40 MG/0.4 ML SUBCUTANEOUS SYRINGE: 40 mg/0.4 mL | SUBCUTANEOUS | Qty: 0.4

## 2016-01-02 MED FILL — LISINOPRIL 20 MG TAB: 20 mg | ORAL | Qty: 2

## 2016-01-02 MED FILL — PREDNISONE 20 MG TAB: 20 mg | ORAL | Qty: 2

## 2016-01-02 MED FILL — CALCIUM CARBONATE 500 MG (1250 MG) TAB: 500 mg calcium (1,250 mg) | ORAL | Qty: 1

## 2016-01-02 MED FILL — AMLODIPINE 5 MG TAB: 5 mg | ORAL | Qty: 2

## 2016-01-02 MED FILL — BD POSIFLUSH NORMAL SALINE 0.9 % INJECTION SYRINGE: INTRAMUSCULAR | Qty: 40

## 2016-01-02 MED FILL — CLONIDINE 0.1 MG TAB: 0.1 mg | ORAL | Qty: 1

## 2016-01-02 MED FILL — CHILDREN'S ASPIRIN 81 MG CHEWABLE TABLET: 81 mg | ORAL | Qty: 1

## 2016-01-02 MED FILL — GLIMEPIRIDE 1 MG TAB: 1 mg | ORAL | Qty: 4

## 2016-01-02 NOTE — Progress Notes (Signed)
[facility] has accepted and patient aware. FOC signed and placed on chart.

## 2016-01-02 NOTE — Progress Notes (Addendum)
PCU SHIFT NURSING NOTE      Bedside shift change report given to Bertram Denver, RN and August Saucer, RN (oncoming nurse) by Marilynne Drivers, RN (offgoing nurse). Report included the following information SBAR, Kardex, Intake/Output, MAR, Recent Results and Cardiac Rhythm NSR.    Shift Summary:   1200 -- Pt up in chair. Decreased O2 to 4L to attempt to wean pt back to home amount. SpO2 91%.    1920 -- Bedside shift change report given to Leavy Cella RN (oncoming nurse) by Bertram Denver, RN (offgoing nurse). Report included the following information SBAR, Kardex, Intake/Output, MAR, Recent Results and Cardiac Rhythm NSR.      Admission Date 12/30/2015   Admission Diagnosis Pneumonia   Consults None        Consults   PT   OT   Speech   Case Management       Palliative      Cardiac Monitoring Order   Yes   No     IV drips   Yes    Drip:                            Dose:  Drip:                            Dose:  Drip:                            Dose:   No     GI Prophylaxis   Yes   No         DVT Prophylaxis   SCDs:             Ted stockings:          Medication   Contraindicated   None      Activity Level Activity Level: Up with Assistance     Activity Assistance: Partial (two people)   Purposeful Rounding every 1-2 hour?   Yes   Schmidt Score  Total Score: 3   Bed Alarm (If score 3 or >)   Yes    Refused (See signed refusal form in chart)   Braden Score  Braden Score: 18   Braden Score (if score 14 or less)   PMT consult   Wound Care consult      Specialty bed    Nutrition consult          Needs prior to discharge:   Home O2 required:    Yes   No    If yes, how much O2 required? 4L    Other:    Last Bowel Movement: Last Bowel Movement Date: 01/01/16      Influenza Vaccine Received Flu Vaccine for Current Season (usually Sept-March): Not Flu Season        Pneumonia Vaccine           Diet Active Orders   Diet    DIET DIABETIC WITH OPTIONS Consistent Carb 1800kcal; Regular      LDAs                Peripheral IV 12/30/15 Left Antecubital (Active)   Site Assessment Clean, dry, & intact 01/02/2016  3:56 AM   Phlebitis Assessment 0 01/02/2016  3:56 AM   Infiltration Assessment 0 01/02/2016  3:56 AM   Dressing Status Clean, dry, & intact 01/02/2016  3:56 AM  Dressing Type Disk with Chlorhexadine gluconate (CHG);Tape;Transparent 01/02/2016  3:56 AM   Hub Color/Line Status Green;Infusing 01/02/2016  3:56 AM   Action Taken Open ports on tubing capped 01/02/2016  3:56 AM   Alcohol Cap Used Yes 01/02/2016  3:56 AM                      Urinary Catheter      Intake & Output   Date 01/01/16 0700 - 01/02/16 0659 01/02/16 0700 - 01/03/16 0659   Shift 0700-1859 1900-0659 24 Hour Total 0700-1859 1900-0659 24 Hour Total   I  N  T  A  K  E   I.V.  (mL/kg/hr)  300  (0.2) 300  (0.1)         Volume (cefTRIAXone (ROCEPHIN) 1 g in 0.9% sodium chloride (MBP/ADV) 50 mL)  50 50         Volume (azithromycin (ZITHROMAX) 500 mg in 0.9% sodium chloride (MBP/ADV) 250 mL)  250 250       Shift Total  (mL/kg)  300  (2) 300  (2)      O  U  T  P  U  T   Urine  (mL/kg/hr) 700  (0.4) 400  (0.2) 1100  (0.3)         Urine Voided [phone]       Shift Total  (mL/kg) 700  (4.8) 400  (2.7) 1100  (7.4)      NET -700 -100 -800      Weight (kg) 145.2 149 149 149 149 149         Readmission Risk Assessment Tool Score High Risk            22       Total Score        3 Has Seen PCP in Last 6 Months (Yes=3, No=0)    4 IP Visits Last 12 Months (1-3=4, 4=9, >4=11)    9 Pt. Coverage (Medicare=5 , Medicaid, or Self-Pay=4)    6 Charlson Comorbidity Score (Age + Comorbid Conditions)        Criteria that do not apply:    Married. Living with Significant Other. Assisted Living. LTAC. SNF. or   Rehab    Patient Length of Stay (>5 days = 3)       Expected Length of Stay 3d 19h   Actual Length of Stay 3

## 2016-01-02 NOTE — Progress Notes (Addendum)
Problem: Self Care Deficits Care Plan (Adult)  Goal: *Acute Goals and Plan of Care (Insert Text)  Occupational Therapy Goals:  Initiated 12/31/2015  1. Patient will perform grooming with modified independence within 7 days.   2. Patient will perform lower body dressing with modified independence within 7 days.   3. Patient will perform toileting with modified independence within 7 days.  4. Patient will transfer from toilet with modified independence using the least restrictive device and appropriate durable medical equipment within 7 days.    OCCUPATIONAL THERAPY TREATMENT  Patient: Brittney Shelton (65 y.o. female)  Date: 01/02/2016  Diagnosis: Pneumonia Pneumonia       Precautions:  Fall, DNR      ASSESSMENT:  Patient received seated in chair after completing breathing treatment. Reviewed LB adaptive equipment introduced yesterday, and pt doffed/donned socks with mod I using equipment. Educated pt on use of equipment to don underwear, which pt donned/doffed with supervision. Instructed pt to take rest breaks during dressing tasks to practice energy conservation techniques. Performed functional mobility with supervision using RW, no LOB noted. Functional transfers performed with supervision using RW. Pt's O2 sats remained 90-93% at rest, 84-88% during dressing tasks and 87% (quickly recovering to 90% in sitting) after ambulating to bathroom on 4L O2. Pt will benefit from continued OT for further education on energy conservation and ADL training. Recommend HHOT and 24/7 supervision upon discharge.  Progression toward goals:  [X]        Improving appropriately and progressing toward goals  [ ]        Improving slowly and progressing toward goals  [ ]        Not making progress toward goals and plan of care will be adjusted       PLAN:  Patient continues to benefit from skilled intervention to address the above impairments.  Continue treatment per established plan of care.  Discharge Recommendations:  Home Health   Further Equipment Recommendations for Discharge:  LB adaptive equipment, shower chair       SUBJECTIVE:   Patient stated ???I usually use my cane, but maybe the walker is better until I start feeling better.???      OBJECTIVE DATA SUMMARY:   Cognitive/Behavioral Status:  Neurologic State: Alert  Orientation Level: Oriented X4  Cognition: Appropriate for age attention/concentration;Follows commands  Perception: Appears intact  Perseveration: No perseveration noted  Safety/Judgement: Fall prevention;Home safety;Decreased awareness of need for safety     Functional Mobility and Transfers for ADLs:  Bed Mobility:   Patient received seated in chair     Transfers:  Sit to Stand: Supervision  Functional Transfers  Toilet Transfer : Supervision     Balance:  Sitting: Intact;Without support  Standing: With support;Impaired (RW)  Standing - Static: Good  Standing - Dynamic : Good     ADL Intervention:    Reviewed LB adaptive equipment introduced yesterday, and pt doffed/donned socks with mod I using sock aid, dressing stick and reacher. Educated pt on use of equipment to don underwear, which pt donned/doffed with supervision using reacher, standing with RW to pull underwear up and down. Instructed pt to take rest breaks throughout dressing tasks to practice energy conservation techniques.      Lower Body Dressing Assistance  Underpants: Supervision/set-up  Socks: Modified independent           Cognitive Retraining  Safety/Judgement: Fall prevention;Home safety;Decreased awareness of need for safety     Pain:  Pain Scale 1: Numeric (0 - 10)  Pain  Intensity 1: 0      Activity Tolerance:   Good  Please refer to the flowsheet for vital signs taken during this treatment.  After treatment:    Patient left in no apparent distress sitting up in chair   Patient left in no apparent distress in bed   Call bell left within reach   Nursing notified   Caregiver present   Bed alarm activated      COMMUNICATION/COLLABORATION:    The patient???s plan of care was discussed with: Registered Nurse     Orland Penman, OTLA  Time Calculation: 30 mins

## 2016-01-02 NOTE — Progress Notes (Signed)
1930:Bedside report received from [place] [facility] visitor. Patient is in the recliner watching T.V. Patient assisted to the restroom. No complaints of pain at this time.

## 2016-01-02 NOTE — Progress Notes (Signed)
Hospitalist Progress Note    NAME: Brittney Shelton   DOB:  February 26, 1951   MRN:  [id number]       Assessment / Plan:  Acute on chronic hypoxic respiratory failure POA - close to baseline Oxygen today 4-5 L/m via NC  due to RUL Pneumonia POA - on CTA chest  Chronic diastolic dysfunction HF EF 60% POA- seems to be compensated  Mild acute COPD exacerbation POA   CTA: There is no evidence of pulmonary embolism. Small bilateral effusions with underlying atelectasis. Right upper lobe consolidation    Cont oxygen via NC, avoid NRB mask, bipap prn only, taper to home 4L/m as able.Pt is now on 6 liters.  Dc iv lasix,start 40 mg po bid   C/w CTX + Zithromax for CAP  Cont prednisone PO for COPD + scheduled jet nebs   Blood cx negative  Daily weight   Add mucinex    AKI and Hypernatremia  Resolved after lasix was held on 8/1 and pt was given iv fluid  Dc iv fluid  ??  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  HTN  Continue home meds   Holding Imdur while on NTP   ????  Hyperlipidemia, cont statin   Lymphedema  Morbid obesity, Body mass index is 50.68 kg/(m^2).  ??  ??  ??  ??  ??  ??  Code Status: DNR as per pt's wishes today   Surrogate Decision Maker: brother and niece   ??  DVT Prophylaxis: lovenox   GI Prophylaxis: not indicated  ??  Baseline: lives with brother and her niece, no children, ambulating with cane; home O2 4 L   Recommended Disposition: Home w/Family + HH PT/OT likely.Possible d/c 8/3     Subjective:     Chief Complaint / Reason for Physician Visit: F/u SOB, Cough  "I feel better today".  Discussed with RN events overnight and instructed to taper Oxygen to 4 liters.    Review of Systems:  Symptom Y/N Comments  Symptom Y/N Comments   Fever/Chills n   Chest Pain n    Poor Appetite n   Edema n    Cough n   Abdominal Pain n    Sputum n   Joint Pain n    SOB/DOE y But improved  Pruritis/Rash      Nausea/vomit    Tolerating PT/OT y    Diarrhea    Tolerating Diet y    Constipation    Other       Could NOT obtain due to:      Objective:     VITALS:   Last 24hrs VS reviewed since prior progress note. Most recent are:  Patient Vitals for the past 24 hrs:   Temp Pulse Resp BP SpO2   01/02/16 0802 98 ??F (36.7 ??C) 73 18 132/54 94 %   01/02/16 0722 - - - - 92 %   01/02/16 0612 - 70 - 138/51 -   01/02/16 0241 - 62 20 140/81 91 %   01/02/16 0205 - 67 - 129/60 (!) 88 %   01/02/16 0112 - 68 - 128/55 -   01/01/16 2254 98.4 ??F (36.9 ??C) 71 20 123/57 93 %   01/01/16 1913 97.9 ??F (36.6 ??C) 70 20 141/62 96 %   01/01/16 1545 - 73 20 136/65 95 %  01/01/16 1541 - - - - 93 %       Intake/Output Summary (Last 24 hours) at 01/02/16 1200  Last data filed at 01/02/16 0620   Gross per 24 hour   Intake              300 ml   Output             1100 ml   Net             -800 ml        PHYSICAL EXAM:  General: WD, WN. Alert, cooperative, no acute distress, morbid obesity noted +????  EENT:  EOMI. Anicteric sclerae. MMM  Resp:  CTA.  No accessory muscle use  CV:  Regular  rhythm,?? No edema  GI:  Soft, Non distended, Non tender. ??+Bowel sounds  Neurologic:?? Alert and oriented X 3, normal speech,   Psych:???? Good insight.??Not anxious nor agitated  Skin:  No rashes.  No jaundice    Reviewed most current lab test results and cultures  YES  Reviewed most current radiology test results   YES  Review and summation of old records today    NO  Reviewed patient's current orders and MAR    YES  PMH/SH reviewed - no change compared to H&P  ________________________________________________________________________  Care Plan discussed with:    Comments   Patient x    Family      RN x    Care Manager     Consultant                        Multidiciplinary team rounds were held today with case manager, nursing, pharmacist and Higher education careers adviser.  Patient's plan of care was discussed; medications were reviewed and discharge planning was addressed.      ________________________________________________________________________  Total NON critical care TIME:  20   Minutes    Total CRITICAL CARE TIME Spent:   Minutes non procedure based      Comments   >50% of visit spent in counseling and coordination of care     ________________________________________________________________________  Darnelle Catalan, MD     Procedures: see electronic medical records for all procedures/Xrays and details which were not copied into this note but were reviewed prior to creation of Plan.      LABS:  I reviewed today's most current labs and imaging studies.  Pertinent labs include:  Recent Labs      01/02/16   0316  01/01/16   0353  12/30/15   1403   WBC  12.3*  14.7*  9.9   HGB  10.8*  13.2  11.4*   HCT  35.1  35.9  36.8   PLT  264  145*  254     Recent Labs      01/02/16   0316  01/01/16   0353  12/30/15   1403   NA  137  148*  139   K  3.9  3.9  3.5   CL  99  115*  99   CO2  33*  29  34*   GLU  104*  130*  202*   BUN  27*  31*  18   CREA  0.84  2.07*  0.91   CA  7.9*  7.9*  8.0*   MG   --    --   1.8   ALB   --    --   3.0*  TBILI   --    --   1.2*   SGOT   --    --   15   ALT   --    --   17   INR   --    --   1.1       Signed: Darnelle Catalan, MD

## 2016-01-02 NOTE — Other (Signed)
DTC Progress Note    Recommendations/ Comments: If appropriate, please consider changing pt correctional insulin scale to high sensitivity. Pt with elevated creatinine yesterday and fasting hypoglycemia today.     Chart reviewed on Brittney Shelton for hypoglycemia.    Patient is a 65 y.o. female with known history of Type 2 Diabetes on Glimepiride 4mg  qam and Metformin 500mg  bid at home.    A1c:   No results found for: HBA1C, HGBE8, HBA1CEXT    Recent Glucose Results: Lab Results   Component Value Date/Time    GLU 104 (H) 01/02/2016 03:16 AM    GLUCPOC 163 (H) 01/02/2016 11:30 AM    GLUCPOC 87 01/02/2016 08:16 AM    GLUCPOC 70 01/02/2016 07:56 AM        Lab Results   Component Value Date/Time    Creatinine 0.84 01/02/2016 03:16 AM     Estimated Creatinine Clearance: 97.7 mL/min (based on Cr of 0.84).    Active Orders   Diet    DIET DIABETIC WITH OPTIONS Consistent Carb 1800kcal; Regular        PO intake: Patient Vitals for the past 72 hrs:   % Diet Eaten   12/31/15 0912 75 %       Current hospital DM medication:   -Humalog normal sensitivity correction  -Glimepiride 4mg  daily before breakfast    Will continue to follow as needed.    Thank you    Joanie Coddington, RD  [facility]  Office: [phone]

## 2016-01-02 NOTE — Progress Notes (Signed)
Problem: Mobility Impaired (Adult and Pediatric)  Goal: *Acute Goals and Plan of Care (Insert Text)  Physical Therapy Goals  Initiated 12/31/2015  1. Patient will move from supine to sit and sit to supine , scoot up and down and roll side to side in bed with independence within 7 day(s).   2. Patient will transfer from bed to chair and chair to bed with modified independence using the least restrictive device within 7 day(s).  3. Patient will perform sit to stand with modified independence within 7 day(s).  4. Patient will ambulate with modified independence for 100 feet with the least restrictive device within 7 day(s).   5. Patient will ascend/descend 3 stairs with one sided handrail(s) with supervision/set-up within 7 day(s).   PHYSICAL THERAPY TREATMENT  SEEN 0947 TO 1010        Patient: Brittney Shelton [phone] y.o. female)  Date: 01/02/2016   Diagnosis: Pneumonia   Precautions: Fall      ASSESSMENT:  Pateint up in chair upon arrival this morning.  She was agreeable to working with PT.  Did LE exercises actively prior to getting up to ambulate.  Stood with supervision to the RW.  Ambulated 20' with RW and CGA.  Needed several standing rest breaks as O2 sats would drop into the 80s.  With PLB she was able to keep her O2 sats in the low 90s.  Up in chair again after ambulation.  Encouraged doing the LE exercises she was instructed in whenever a set of commercials came on.  Home with HHPT.  Progression toward goals:  [X]     Improving appropriately and progressing toward goals  [ ]     Improving slowly and progressing toward goals  [ ]     Not making progress toward goals and plan of care will be adjusted       PLAN:  Patient continues to benefit from skilled intervention to address the above impairments.  Continue treatment per established plan of care.  Discharge Recommendations:  Home Health  Further Equipment Recommendations for Discharge:  Has needed equipment, needs to use her RW when she goes home.       SUBJECTIVE:    Patient stated ???I'm doing better today.???      OBJECTIVE DATA SUMMARY:   Critical Behavior:  Neurologic State: Alert  Orientation Level: Oriented X4  Cognition: Appropriate for age attention/concentration, Follows commands  Safety/Judgement: Fall prevention, Home safety, Decreased awareness of need for safety      Functional Mobility Training:  Transfers:  Sit to Stand: Supervision  Stand to Sit: Contact guard assistance        Balance:  Sitting: Intact;Without support  Standing: With support;Impaired (RW)  Standing - Static: Good  Standing - Dynamic : Good     Ambulation/Gait Training:  Distance (ft): 80 Feet (ft)  Assistive Device: Gait belt;Walker, rolling  Ambulation - Level of Assistance: Contact guard assistance  Gait Abnormalities: Decreased step clearance  Base of Support: Widened  Speed/Cadence: Pace decreased (<100 feet/min)  Step Length: Left shortened;Right shortened                 Therapeutic Exercises:   Toe wiggles, ankle pumps, long arc quads, horizontal hip abduction/adduction and marching (5 reps each LE)     Pain:  Pain Intensity 1: 0     Activity Tolerance:  Tolerated PT session well.  Please refer to the flowsheet for vital signs taken during this treatment.  After treatment:   [X]   Patient left in no apparent distress sitting up in chair      Patient left in no apparent distress in bed      Call bell left within reach      Nursing notified      Caregiver present      Bed alarm activated      COMMUNICATION/COLLABORATION:   The patient???s plan of care was discussed with: Registered Nurse     Festus Holts, PT  Time Calculation: 23 mins

## 2016-01-02 NOTE — Progress Notes (Addendum)
PCU SHIFT NURSING NOTE      Bedside shift change report given to Whitney RN and Irving Burton RN (oncoming nurse) by Desmond Dike (offgoing nurse). Report included the following information SBAR, Kardex, Intake/Output, Recent Results and Cardiac Rhythm NSR.    Shift Summary:  1930: Bedside report given to night shift nurse. No complaints of pain at this time. Will continue to monitor   0600: Patient has an uneventful night   0700: Report given to [employer] . Call bell in reach         Admission Date 12/30/2015   Admission Diagnosis Pneumonia   Consults None        Consults   PT   OT   Speech   Case Management       Palliative      Cardiac Monitoring Order   Yes   No     IV drips   Yes    Drip:                            Dose:  Drip:                            Dose:  Drip:                            Dose:   No     GI Prophylaxis   Yes   No         DVT Prophylaxis   SCDs:             Ted stockings:          Medication   Contraindicated   None      Activity Level Activity Level: Up with Assistance     Activity Assistance: Partial (two people)   Purposeful Rounding every 1-2 hour?   Yes   Schmidt Score  Total Score: 3   Bed Alarm (If score 3 or >)   Yes    Refused (See signed refusal form in chart)   Braden Score  Braden Score: 18   Braden Score (if score 14 or less)   PMT consult   Wound Care consult      Specialty bed    Nutrition consult          Needs prior to discharge:   Home O2 required:    Yes   No    If yes, how much O2 required?    Other:    Last Bowel Movement: Last Bowel Movement Date: 01/01/16      Influenza Vaccine Received Flu Vaccine for Current Season (usually Sept-March): Not Flu Season        Pneumonia Vaccine           Diet Active Orders   Diet    DIET DIABETIC WITH OPTIONS Consistent Carb 1800kcal; Regular      LDAs               Peripheral IV 12/30/15 Left Antecubital (Active)   Site Assessment Clean, dry, & intact 01/01/2016  7:43 AM   Phlebitis Assessment 0 01/01/2016  7:43 AM    Infiltration Assessment 0 01/01/2016  7:43 AM   Dressing Status Clean, dry, & intact 01/01/2016  7:43 AM   Dressing Type Transparent 01/01/2016  7:43 AM   Hub Color/Line Status Flushed;End cap changed 01/01/2016  7:43 AM  Alcohol Cap Used Yes 01/01/2016  7:43 AM                      Urinary Catheter      Intake & Output   Date 01/01/16 0700 - 01/02/16 0659 01/02/16 0700 - 01/03/16 0659   Shift 0700-1859 1900-0659 24 Hour Total 0700-1859 1900-0659 24 Hour Total   I  N  T  A  K  E   I.V.  (mL/kg/hr)  300 300         Volume (cefTRIAXone (ROCEPHIN) 1 g in 0.9% sodium chloride (MBP/ADV) 50 mL)  50 50         Volume (azithromycin (ZITHROMAX) 500 mg in 0.9% sodium chloride (MBP/ADV) 250 mL)  250 250       Shift Total  (mL/kg)  300  (2.1) 300  (2.1)      O  U  T  P  U  T   Urine  (mL/kg/hr) 700  (0.4)  700         Urine Voided 700  700       Shift Total  (mL/kg) 700  (4.8)  700  (4.8)      NET -700 300 -400      Weight (kg) 145.2 145.2 145.2 145.2 145.2 145.2         Readmission Risk Assessment Tool Score High Risk            22       Total Score        3 Has Seen PCP in Last 6 Months (Yes=3, No=0)    4 IP Visits Last 12 Months (1-3=4, 4=9, >4=11)    9 Pt. Coverage (Medicare=5 , Medicaid, or Self-Pay=4)    6 Charlson Comorbidity Score (Age + Comorbid Conditions)        Criteria that do not apply:    Married. Living with Significant Other. Assisted Living. LTAC. SNF. or   Rehab    Patient Length of Stay (>5 days = 3)       Expected Length of Stay 3d 19h   Actual Length of Stay 3

## 2016-01-02 NOTE — Progress Notes (Signed)
CM Initial Assessment        Current admission assessment--  Patient came in with sob and elevated left leg swelling with pain, Her spo2 typically run in the 80"s but prior to coming to the  ed she was down to 43 to 44% sats for brief period prior to coming to the hospital. She is normally on oxygen four liters at home continuously. She states prior to coming to the hospital she was independent . She uses a cane to ambulate. She lives in one story home with her brother who is retired. She gets her prescriptions filled at [facility] on [address]. She has been to [facility] in [place] in the past. She has used [facility] in the past. Discussed dcp and she would like to have [facility] when discharged. Orders received from Dr Orlinda Blalock for pt/ot and nursing and referral sent to them and will await their response.           Care Management Interventions  PCP Verified by CM: Yes  Discharge Durable Medical Equipment:  (Patient has home oxygen, cane, glucometer, bp cuff and rolling walker at home. )  Physical Therapy Consult: Yes  Occupational Therapy Consult: Yes  Current Support Network: Other (Lives with her brother. )  Confirm Follow Up Transport: Family  Plan discussed with Pt/Family/Caregiver: Yes  Freedom of Choice Offered: Yes  Discharge Location  Discharge Placement: Home                   VCStowers-Yerby [id number] EXT [phone]

## 2016-01-03 LAB — CBC WITH AUTOMATED DIFF
ABS. BASOPHILS: 0 10*3/uL (ref 0.0–0.1)
ABS. EOSINOPHILS: 0.1 10*3/uL (ref 0.0–0.4)
ABS. LYMPHOCYTES: 1.9 10*3/uL (ref 0.8–3.5)
ABS. MONOCYTES: 0.6 10*3/uL (ref 0.0–1.0)
ABS. NEUTROPHILS: 9 10*3/uL — ABNORMAL HIGH (ref 1.8–8.0)
BASOPHILS: 0 % (ref 0–1)
EOSINOPHILS: 1 % (ref 0–7)
HCT: 36.7 % (ref 35.0–47.0)
HGB: 11.2 g/dL — ABNORMAL LOW (ref 11.5–16.0)
LYMPHOCYTES: 17 % (ref 12–49)
MCH: 27.9 PG (ref 26.0–34.0)
MCHC: 30.5 g/dL (ref 30.0–36.5)
MCV: 91.3 FL (ref 80.0–99.0)
MONOCYTES: 5 % (ref 5–13)
NEUTROPHILS: 77 % — ABNORMAL HIGH (ref 32–75)
PLATELET: 273 10*3/uL (ref 150–400)
RBC: 4.02 M/uL (ref 3.80–5.20)
RDW: 17.2 % — ABNORMAL HIGH (ref 11.5–14.5)
WBC: 11.7 10*3/uL — ABNORMAL HIGH (ref 3.6–11.0)

## 2016-01-03 LAB — METABOLIC PANEL, BASIC
Anion gap: 6 mmol/L (ref 5–15)
BUN/Creatinine ratio: 32 — ABNORMAL HIGH (ref 12–20)
BUN: 27 MG/DL — ABNORMAL HIGH (ref 6–20)
CO2: 33 mmol/L — ABNORMAL HIGH (ref 21–32)
Calcium: 8.2 MG/DL — ABNORMAL LOW (ref 8.5–10.1)
Chloride: 98 mmol/L (ref 97–108)
Creatinine: 0.84 MG/DL (ref 0.55–1.02)
GFR est AA: >60 mL/min/{1.73_m2} (ref >60)
GFR est non-AA: >60 mL/min/{1.73_m2} (ref >60)
Glucose: 81 mg/dL (ref 65–100)
Potassium: 4.1 mmol/L (ref 3.5–5.1)
Sodium: 137 mmol/L (ref 136–145)

## 2016-01-03 LAB — GLUCOSE, POC
Glucose (POC): 256 mg/dL — ABNORMAL HIGH (ref 65–100)
Glucose (POC): 62 mg/dL — ABNORMAL LOW (ref 65–100)
Glucose (POC): 71 mg/dL (ref 65–100)
Glucose (POC): 71 mg/dL (ref 65–100)
Glucose (POC): 86 mg/dL (ref 65–100)
Glucose (POC): 215 mg/dL — ABNORMAL HIGH (ref 65–100)

## 2016-01-03 MED ORDER — LEVOFLOXACIN 500 MG TAB
500 mg | ORAL_TABLET | Freq: Every day | ORAL | 0 refills | Status: DC
Start: 2016-01-03 — End: 2016-01-09

## 2016-01-03 MED ORDER — LEVOFLOXACIN 500 MG TAB
500 mg | ORAL_TABLET | Freq: Every day | ORAL | 0 refills | Status: DC
Start: 2016-01-03 — End: 2016-01-03

## 2016-01-03 MED FILL — PREDNISONE 20 MG TAB: 20 mg | ORAL | Qty: 2

## 2016-01-03 MED FILL — NITRO-BID 2 % TRANSDERMAL OINTMENT: 2 % | TRANSDERMAL | Qty: 1

## 2016-01-03 MED FILL — LOVENOX 40 MG/0.4 ML SUBCUTANEOUS SYRINGE: 40 mg/0.4 mL | SUBCUTANEOUS | Qty: 0.4

## 2016-01-03 MED FILL — BD POSIFLUSH NORMAL SALINE 0.9 % INJECTION SYRINGE: INTRAMUSCULAR | Qty: 30

## 2016-01-03 MED FILL — PRAVASTATIN 10 MG TAB: 10 mg | ORAL | Qty: 2

## 2016-01-03 MED FILL — INSULIN LISPRO 100 UNIT/ML INJECTION: 100 unit/mL | SUBCUTANEOUS | Qty: 1

## 2016-01-03 MED FILL — MUCINEX 600 MG TABLET, EXTENDED RELEASE: 600 mg | ORAL | Qty: 1

## 2016-01-03 MED FILL — BD POSIFLUSH NORMAL SALINE 0.9 % INJECTION SYRINGE: INTRAMUSCULAR | Qty: 10

## 2016-01-03 MED FILL — FUROSEMIDE 40 MG/4 ML ORAL SOLUTION: 40 mg/4 mL | ORAL | Qty: 4

## 2016-01-03 MED FILL — AZITHROMYCIN 500 MG IV SOLUTION: 500 mg | INTRAVENOUS | Qty: 5

## 2016-01-03 MED FILL — AMLODIPINE 5 MG TAB: 5 mg | ORAL | Qty: 2

## 2016-01-03 MED FILL — IPRATROPIUM-ALBUTEROL 2.5 MG-0.5 MG/3 ML NEB SOLUTION: 2.5 mg-0.5 mg/3 ml | RESPIRATORY_TRACT | Qty: 3

## 2016-01-03 MED FILL — LISINOPRIL 20 MG TAB: 20 mg | ORAL | Qty: 2

## 2016-01-03 MED FILL — TOPROL XL 50 MG TABLET,EXTENDED RELEASE: 50 mg | ORAL | Qty: 4

## 2016-01-03 MED FILL — CHILDREN'S ASPIRIN 81 MG CHEWABLE TABLET: 81 mg | ORAL | Qty: 1

## 2016-01-03 MED FILL — BD POSIFLUSH NORMAL SALINE 0.9 % INJECTION SYRINGE: INTRAMUSCULAR | Qty: 20

## 2016-01-03 MED FILL — CEFTRIAXONE 1 GRAM SOLUTION FOR INJECTION: 1 gram | INTRAMUSCULAR | Qty: 1

## 2016-01-03 MED FILL — VITAMIN D3 25 MCG (1,000 UNIT) TABLET: 25 mcg (1,000 unit) | ORAL | Qty: 5

## 2016-01-03 MED FILL — GLIMEPIRIDE 1 MG TAB: 1 mg | ORAL | Qty: 4

## 2016-01-03 MED FILL — CLONIDINE 0.1 MG TAB: 0.1 mg | ORAL | Qty: 1

## 2016-01-03 MED FILL — CALCIUM CARBONATE 500 MG (1250 MG) TAB: 500 mg calcium (1,250 mg) | ORAL | Qty: 1

## 2016-01-03 NOTE — Other (Signed)
DTC Progress Note    Recommendations/ Comments: Please consider changing pt correctional insulin scale to high sensitivity. Pt with elevated creatinine yesterday and fasting hypoglycemia yesterday and today.  May also want to consider decreasing amaryl if hypoglycemia continues with change to correction scale.     Chart reviewed on [name] for hypoglycemia.    Patient is a 65 y.o. female with known history of Type 2 Diabetes on Glimepiride 4mg  qam and Metformin 500mg  bid at home.    A1c:   No results found for: HBA1C, HGBE8, HBA1CEXT, HBA1CEXT    Recent Glucose Results:   Lab Results   Component Value Date/Time    GLU 81 01/03/2016 04:13 AM    GLUCPOC 86 01/03/2016 10:26 AM    GLUCPOC 71 01/03/2016 10:06 AM    GLUCPOC 71 01/03/2016 09:41 AM        Lab Results   Component Value Date/Time    Creatinine 0.84 01/03/2016 04:13 AM     Estimated Creatinine Clearance: 96 mL/min (based on Cr of 0.84).    Active Orders   Diet    DIET DIABETIC WITH OPTIONS Consistent Carb 1800kcal; Regular        PO intake:   Patient Vitals for the past 72 hrs:   % Diet Eaten   01/03/16 0716 50 %       Current hospital DM medication:   -Humalog normal sensitivity correction  -Glimepiride 4mg  daily before breakfast    Will continue to follow as needed.    Thank you  Loma Boston, BSN, RN, CDE  [facility]

## 2016-01-03 NOTE — Progress Notes (Signed)
PCU SHIFT NURSING NOTE      Bedside and Verbal shift change report given to Dickie La, Charity fundraiser (Cabin crew) by Raynelle Fanning, RN (offgoing nurse). Report included the following information SBAR, Kardex, Recent Results and Cardiac Rhythm SR.    Shift Summary:       Admission Date 12/30/2015   Admission Diagnosis Pneumonia   Consults None        Consults   PT   OT   Speech   Case Management       Palliative      Cardiac Monitoring Order   Yes   No     IV drips   Yes    Drip:                            Dose:  Drip:                            Dose:  Drip:                            Dose:   No     GI Prophylaxis   Yes   No         DVT Prophylaxis   SCDs:             Ted stockings:          Medication   Contraindicated   None      Activity Level Activity Level: Up with Assistance     Activity Assistance: Partial (one person)   Purposeful Rounding every 1-2 hour?   Yes   Schmidt Score  Total Score: 3   Bed Alarm (If score 3 or >)   Yes    Refused (See signed refusal form in chart)   Braden Score  Braden Score: 18   Braden Score (if score 14 or less)   PMT consult   Wound Care consult      Specialty bed    Nutrition consult          Needs prior to discharge:   Home O2 required:    Yes   No    If yes, how much O2 required?    Other:    Last Bowel Movement: Last Bowel Movement Date: 01/02/16      Influenza Vaccine Received Flu Vaccine for Current Season (usually Sept-March): Not Flu Season        Pneumonia Vaccine           Diet Active Orders   Diet    DIET DIABETIC WITH OPTIONS Consistent Carb 1800kcal; Regular      LDAs               Peripheral IV 12/30/15 Left Antecubital (Active)   Site Assessment Clean, dry, & intact 01/03/2016  7:16 AM   Phlebitis Assessment 0 01/03/2016  7:16 AM   Infiltration Assessment 0 01/03/2016  7:16 AM   Dressing Status Clean, dry, & intact 01/03/2016  7:16 AM   Dressing Type Transparent;Tape 01/03/2016  7:16 AM   Hub Color/Line Status Green;Capped 01/03/2016  7:16 AM    Action Taken Open ports on tubing capped 01/02/2016  3:56 AM   Alcohol Cap Used Yes 01/02/2016  3:53 PM                      Urinary Catheter  Intake & Output   Date 01/02/16 0700 - 01/03/16 0659 01/03/16 0700 - 01/04/16 0659   Shift 0700-1859 1900-0659 24 Hour Total 0700-1859 1900-0659 24 Hour Total   I  N  T  A  K  E   P.O.  240 240 240  240      P.O.  240 240 240  240    Shift Total  (mL/kg)  240  (1.7) 240  (1.7) 240  (1.7)  240  (1.7)   O  U  T  P  U  T   Urine  (mL/kg/hr)            Urine Occurrence(s) 3 x 3 x 6 x 1 x  1 x    Shift Total  (mL/kg)         NET  240 240 240  240   Weight (kg) 149 145 145 145 145 145         Readmission Risk Assessment Tool Score High Risk            22       Total Score        3 Has Seen PCP in Last 6 Months (Yes=3, No=0)    4 IP Visits Last 12 Months (1-3=4, 4=9, >4=11)    9 Pt. Coverage (Medicare=5 , Medicaid, or Self-Pay=4)    6 Charlson Comorbidity Score (Age + Comorbid Conditions)        Criteria that do not apply:    Married. Living with Significant Other. Assisted Living. LTAC. SNF. or   Rehab    Patient Length of Stay (>5 days = 3)       Expected Length of Stay 3d 19h   Actual Length of Stay 4

## 2016-01-03 NOTE — Progress Notes (Signed)
Second medicare im letter given to patient and copy placed on chart. Patient will make her follow up appointment when she arrives home secondary office is closed at this time. Called [facility] and left message with them patient is discharging today. They accepted her on yesterday.

## 2016-01-03 NOTE — Progress Notes (Signed)
Discharge brochure provided; Reviewed DC instructions with patient. Opportunity for questions and clarifications was provided; verbalized understanding. Follow-up appointments reviewed/written for patient. MAR reviewed with patient to clarify medications given during hospital stay, today.     Pt stable and ambulatory for discharge; Family to provide transportation to home.Marland Kitchen Room checked and all belongings sent with patient.    IV removed (without difficulty/pt tolerated well) Telemetry discontinued.

## 2016-01-03 NOTE — Discharge Summary (Signed)
Discharge Summary    Patient: Brittney Shelton               Sex: female          DOA: 12/30/2015         Date of Birth:  [birth year]      Age:  65 y.o.        LOS:  LOS: 4 days                Admit Date: 12/30/2015    Discharge Date: 01/03/2016    Admission Diagnoses: Pneumonia    Discharge Diagnoses:    Acute on chronic hypoxic respiratory failure  PneumOnia,RUL CAP  OCPD exacerbation  AKI  Hypernatremia  Diabetes 2  Chronic diastolic dysfunction HF EF 60%    Problem List as of 01/03/2016  Date Reviewed: 21-Jan-2016          Codes Class Noted - Resolved    * (Principal)Pneumonia ICD-10-CM: J18.9  ICD-9-CM: 486  12/30/2015 - Present        Obesity ICD-10-CM: E66.9  ICD-9-CM: 278.00  07/27/2015 - Present        Requires continuous at home supplemental oxygen ICD-10-CM: Z99.81  ICD-9-CM: V46.2  07/27/2015 - Present        Dependence on supplemental oxygen ICD-10-CM: Z99.81  ICD-9-CM: V46.2  07/27/2015 - Present        ACP (advance care planning) ICD-10-CM: Z71.89  ICD-9-CM: V65.49  07/27/2015 - Present        Counseling regarding advanced care planning and goals of care ICD-10-CM: Z71.89  ICD-9-CM: V65.49  07/27/2015 - Present        Dyspnea ICD-10-CM: R06.00  ICD-9-CM: 786.09  07/27/2015 - Present        COPD (chronic obstructive pulmonary disease) (HCC) ICD-10-CM: J44.9  ICD-9-CM: 496  07/26/2015 - Present        Hypertension, uncontrolled ICD-10-CM: I10  ICD-9-CM: 401.9  09/29/2014 - Present        Well controlled type 2 diabetes mellitus (HCC) ICD-10-CM: E11.9  ICD-9-CM: 250.00  09/29/2014 - Present        Hypercholesterolemia ICD-10-CM: E78.00  ICD-9-CM: 272.0  09/29/2014 - Present        Mixed simple and mucopurulent chronic bronchitis (HCC) ICD-10-CM: J41.8  ICD-9-CM: 491.1  09/29/2014 - Present        Chronic acquired lymphedema ICD-10-CM: I89.0  ICD-9-CM: 457.1  09/29/2014 - Present        RESOLVED: Type 2 diabetes mellitus with hypoglycemia (HCC) ICD-10-CM: E11.649  ICD-9-CM: 250.80  07/27/2015 - 11/07/2015         RESOLVED: Acute infective exacerbation of chronic obstructive airway disease (HCC) ICD-10-CM: J44.1  ICD-9-CM: 491.21  07/27/2015 - 11/07/2015        RESOLVED: Respiratory failure (HCC) ICD-10-CM: J96.90  ICD-9-CM: 518.81  07/26/2015 - 11/07/2015        RESOLVED: Hypoglycemia ICD-10-CM: E16.2  ICD-9-CM: 251.2  07/26/2015 - 11/07/2015              Discharge Medications:     Current Discharge Medication List      START taking these medications    Details   levoFLOXacin (LEVAQUIN) 500 mg tablet Take 1 Tab by mouth daily.  Qty: 4 Tab, Refills: 0         CONTINUE these medications which have NOT CHANGED    Details   furosemide (LASIX) 40 mg tablet TAKE 1 TABLET BY MOUTH TWICE DAILY FOR SWELLING  Qty: 180 Tab, Refills:  0    Associated Diagnoses: Chronic acquired lymphedema      glimepiride (AMARYL) 4 mg tablet TK 1 T PO ONCE D QAM  Refills: 5      cholecalciferol, VITAMIN D3, (VITAMIN D3) 5,000 unit tab tablet Take 1 Tab by mouth daily.  Qty: 30 Tab, Refills: 5    Associated Diagnoses: Hypovitaminosis D      pravastatin (PRAVACHOL) 20 mg tablet TAKE 1 TABLET BY MOUTH ONCE DAILY AT NIGHT  Qty: 90 Tab, Refills: 5    Comments: **Patient requests 90 days supply**  Associated Diagnoses: Hypercholesterolemia      metFORMIN (GLUCOPHAGE) 500 mg tablet TAKE 1 TABLET BY MOUTH TWICE DAILY WITH MEALS FOR DIABETES  Qty: 180 Tab, Refills: 5    Comments: **Patient requests 90 days supply**  Associated Diagnoses: Well controlled type 2 diabetes mellitus (HCC)      amLODIPine (NORVASC) 10 mg tablet TAKE 1 TABLET BY MOUTH DAILY  Qty: 90 Tab, Refills: 3    Associated Diagnoses: Hypertension, uncontrolled      albuterol-ipratropium (DUO-NEB) 2.5 mg-0.5 mg/3 ml nebu Take 3 mL by inhalation three (3) times daily.      albuterol (PROVENTIL HFA, VENTOLIN HFA, PROAIR HFA) 90 mcg/actuation inhaler Take 1 Puff by inhalation every six (6) hours as needed for Wheezing.  Qty: 1 Inhaler, Refills: 0       metoprolol succinate (TOPROL-XL) 200 mg XL tablet TAKE 1 TABLET BY MOUTH DAILY FOR HIGH BLOOD PRESSURE  Qty: 90 Tab, Refills: 5    Comments: **Patient requests 90 days supply**  Associated Diagnoses: Hypertension, uncontrolled      lisinopril (PRINIVIL, ZESTRIL) 40 mg tablet TAKE 1 TABLET BY MOUTH ONCE DAILY  Qty: 90 Tab, Refills: 5    Comments: **Patient requests 90 days supply**  Associated Diagnoses: Hypertension, uncontrolled      isosorbide dinitrate (ISORDIL) 30 mg tablet TAKE 1 TABLET BY MOUTH THREE TIMES DAILY  Qty: 270 Tab, Refills: 5    Comments: **Patient requests 90 days supply**  Associated Diagnoses: Hypertension, uncontrolled      cloNIDine HCl (CATAPRES) 0.1 mg tablet TAKE 1 TABLET BY MOUTH ONCE DAILY  Qty: 90 Tab, Refills: 5    Comments: **Patient requests 90 days supply**  Associated Diagnoses: Hypertension, uncontrolled      aspirin 81 mg chewable tablet Take 81 mg by mouth daily.      calcium carbonate (OS-CAL) 500 mg calcium (1,250 mg) tablet Take  by mouth daily.      Oxygen Take 4 L by inhalation continuous.             Follow-up:pcp    Discharge Condition:GOOD    Activity:as tolerated    Diet:heart healthy diabetic diet      Labs:  Labs: Results:       Chemistry Recent Labs      01/03/16   0413  01/02/16   0316  01/01/16   0353   GLU  81  104*  130*   NA  137  137  148*   K  4.1  3.9  3.9   CL  98  99  115*   CO2  33*  33*  29   BUN  27*  27*  31*   CREA  0.84  0.84  2.07*   CA  8.2*  7.9*  7.9*   AGAP  6  5  4*   BUCR  32*  32*  15      CBC w/Diff Recent  Labs      01/03/16   0413  01/02/16   0316  01/01/16   0353   WBC  11.7*  12.3*  14.7*   RBC  4.02  3.84  4.23   HGB  11.2*  10.8*  13.2   HCT  36.7  35.1  35.9   PLT  273  264  145*   GRANS  77*  79*   --    LYMPH  17  14   --    EOS  1  1   --       Cardiac Enzymes No results for input(s): CPK, CKND1, MYO in the last 72 hours.    No lab exists for component: CKRMB, TROIP    Coagulation No results for input(s): PTP, INR, APTT in the last 72 hours.    No lab exists for component: INREXT    Lipid Panel Lab Results   Component Value Date/Time    Cholesterol, total 206 11/07/2015 12:25 PM    HDL Cholesterol 51 11/07/2015 12:25 PM    LDL, calculated 135 11/07/2015 12:25 PM    VLDL, calculated 20 11/07/2015 12:25 PM    Triglyceride 102 11/07/2015 12:25 PM      BNP No results for input(s): BNPP in the last 72 hours.   Liver Enzymes No results for input(s): TP, ALB, TBIL, AP, SGOT, GPT in the last 72 hours.    No lab exists for component: DBIL   Thyroid Studies Lab Results   Component Value Date/Time    TSH 3.310 02/15/2015 10:25 AM          Imaging:  CTA chest  IMPRESSION: There is no evidence of pulmonary embolism. Small bilateral  effusions with underlying atelectasis. Right upper lobe consolidation    Consults:      Treatment Team: Treatment Team: Attending Provider: Darnelle Catalan, MD; Utilization Review: Ames Coupe, RN; Care Manager: Kathyrn Drown, RN    Significant Diagnostic Studies:see recent lab results    HISTORY OF PRESENT ILLNESS:     Brittney Shelton is a 65 y.o.  African American female who presents with above complaint. Pt started with dyspnea 4 days ago. Dyspnea was getting progressively worse. SOB was exacerbated by ambulation and when she is laying flat. She admits to some chest discomfort but cannot provide details. Pt chronically on home O2 at 4L. No fever/chills. Pt was travelling with family last week. She is normally taking Lasix BID but because of travelling she was taking it only once a day.     Hospital Course:  Acute on chronic hypoxic respiratory failure POA - close to baseline Oxygen today 4-5 L/m via NC  due to RUL Pneumonia POA - on CTA chest  Chronic diastolic dysfunction HF EF 60% POA- seems to be compensated  Mild acute COPD exacerbation POA   CTA: There is no evidence of pulmonary embolism. Small bilateral effusions  with underlying atelectasis. Right upper lobe consolidation  ??  Oxygen via NC,bipap prn only, taper to home 4L/m as able.Today patient is on 4 liters and O2sat 96%,at her baseline  Dc iv lasix,start 40 mg po bid   Has been CTX + Zithromax for CAP  Has been on  prednisone PO for COPD + scheduled jet nebs   Blood cx negative  Daily weight   Add mucinex  No prednisone on discharge since patient has been on prednisone 40 mg po daily x 5 days.  Will discharge on levaquin x 4 days  AKI and Hypernatremia  Resolved after lasix was held on 8/1 and pt was given iv fluid  Dc iv fluid  ????  DM type II - uncontrolled due to steroids  Holding metformin  Continue Amaryl   C/w SS, consider lantus or NPH while on steroids is FS rises >250   ????  HTN  Continue home meds   Holding Imdur while on NTP   ????  Hyperlipidemia, cont statin   Lymphedema  Morbid obesity, Body mass index is 50.68 kg/(m^2).  Educated weight control    Code Status: DNR as per pt's wishes during this admission     Pt is clinically stable for discharge.    Time for discharge:45 minutes.    Darnelle Catalan, MD  January 03, 2016

## 2016-01-03 NOTE — Progress Notes (Addendum)
Problem: Self Care Deficits Care Plan (Adult)  Goal: *Acute Goals and Plan of Care (Insert Text)  Occupational Therapy Goals:  Initiated 12/31/2015  1. Patient will perform grooming with modified independence within 7 days.   2. Patient will perform lower body dressing with modified independence within 7 days.   3. Patient will perform toileting with modified independence within 7 days.  4. Patient will transfer from toilet with modified independence using the least restrictive device and appropriate durable medical equipment within 7 days.    OCCUPATIONAL THERAPY TREATMENT  Patient: Brittney Shelton [phone] y.o. female)  Date: 01/03/2016  Diagnosis: Pneumonia Pneumonia       Precautions:  Fall, DNR      ASSESSMENT:  Patient pleasant and agreeable to participate in therapy. Reviewed energy conservation techniques of pacing, performing tasks in sitting and taking rest breaks, pt verbalized understanding. With pt ambulating with cane, she retrieved grooming items from table performed standing grooming ADLs at sink with modified independence. Mild unsteadiness noted during functional mobility with cane, but no LOB.  Cued pt to take seated rest break on chair in bathroom prior to ambulating back to chair to practice energy conservation techniques. Also performed toileting mod I and toilet transfer with supervision using grabbar. Pt's O2 95-97% at rest and 89-93% with activity in standing throughout session. Pt is progressing well, with no c/o of SOB during activity and improved O2 sats. She will benefit from continued OT to increase independence with ADLs and reinforce energy conservation education. Recommend HHOT upon discharge.  Progression toward goals:  [X]        Improving appropriately and progressing toward goals  [ ]        Improving slowly and progressing toward goals  [ ]        Not making progress toward goals and plan of care will be adjusted       PLAN:  Patient continues to benefit from skilled intervention to address the  above impairments.  Continue treatment per established plan of care.   Discharge Recommendations:  Home Health  Further Equipment Recommendations for Discharge:  shower chair and LB adaptive equipment       SUBJECTIVE:   Patient stated ???I feel pretty good after doing that.???      OBJECTIVE DATA SUMMARY:   Cognitive/Behavioral Status:  Neurologic State: Alert  Orientation Level: Oriented X4  Cognition: Follows commands;Appropriate for age attention/concentration  Perception: Appears intact  Perseveration: No perseveration noted  Safety/Judgement: Awareness of environment;Fall prevention;Home safety     Functional Mobility and Transfers for ADLs:  Bed Mobility:   Pt received in chair and returned to chair at end of session.     Transfers:  Sit to Stand: Modified independent  Functional Transfers  Toilet Transfer : Supervision     Balance:  Sitting: Intact;Without support  Standing: Impaired;With support (cane)  Standing - Static: Good  Standing - Dynamic : Fair     ADL Intervention:  With pt ambulating with cane, she retrieved grooming items from table performed standing grooming ADLs at sink with modified independence. Cued pt to take seated rest break after grooming to practice energy conservation techniques. She performed toilet hygiene with mod I, used grabbar for steadying in standing. Reviewed energy conservation techniques of pacing, performing tasks in sitting and taking rest breaks, pt verbalized understanding. Encouraged pt to use LB adaptive equipment for dressing at home, pt reports she has a reacher and long handle sponge and will have her brother pick up additional equipment  if needed.  Feeding  Feeding Assistance: Independent     Grooming  Grooming Assistance: Modified independent (standing at sink)  Washing Hands: Modified independent  Brushing Teeth: Modified independent        Toileting  Toileting Assistance: Modified independent  Bladder Hygiene: Modified independent     Cognitive Retraining   Safety/Judgement: Awareness of environment;Fall prevention;Home safety     Pain:  Pain Scale 1: Numeric (0 - 10)  Pain Intensity 1: 0      Activity Tolerance:   Good  Please refer to the flowsheet for vital signs taken during this treatment.  After treatment:    Patient left in no apparent distress sitting up in chair   Patient left in no apparent distress in bed   Call bell left within reach   Nursing notified   Caregiver present   Bed alarm activated       COMMUNICATION/COLLABORATION:   The patient???s plan of care was discussed with: Physical Therapist and Registered Nurse     Orland Penman, OTLA  Time Calculation: 29 mins

## 2016-01-04 LAB — CULTURE, BLOOD, PAIRED: Culture result:: NO GROWTH

## 2016-01-04 NOTE — Progress Notes (Signed)
Unable to reach patient. This Clinical research associate contact information left on voicemail.     0900 spoke with PCP nurse regarding patient and PCP can see patient today, 01/04/16 at 1130. Will attempt to contact patient again to give appointment information.     0919 unable to reach patient. This Clinical research associate contact information left on voicemail .    5638 spoke with [place] at Dr. Jayme Cloud office and she informed this writer patient last appointment was 06/07/15(in media), has no upcoming appointments scheduled and she cancelled appointment she had for July.     1158 No return call received from patient. Attempted to contact unable to reach patient. This Clinical research associate contact information left on voicemail. Tentative TOC appointment scheduled for Monday, 01/07/16 at 1200 with Dr. Lenna Sciara due to PCP out of office.

## 2016-01-05 ENCOUNTER — Encounter: Admit: 2016-01-05 | Discharge: 2016-01-05 | Payer: MEDICARE | Primary: Internal Medicine

## 2016-01-07 ENCOUNTER — Encounter: Primary: Internal Medicine

## 2016-01-07 ENCOUNTER — Encounter: Attending: Family Medicine | Primary: Internal Medicine

## 2016-01-07 NOTE — Progress Notes (Signed)
Unable to reach patient. This Clinical research associatewriter contact information left on voicemail.     1000 spoke with patient brother, Alden Serverrnest, after verifying HIPPA, name and DOB. Provided introduction of self and explained this writer has been unable to contact patient after multiple attempts. This Clinical research associatewriter asked brother to have patient call this Clinical research associatewriter. Brother hands phone to patient. Patient explained her phone is not working at this time Patient declines appointment for today, 01/07/16 due to transportation. This Clinical research associatewriter explained the importance of keeping appointments with providers and this writer agrees to schedule appointment for 01/16/16 with PCP. She states she and her transportation have other upcoming appointments and she is taking al prescribed medication and continues to wear her oxygen. Patient confirms home health came to home over the weekend and scheduled to be seen again today.

## 2016-01-08 ENCOUNTER — Encounter: Primary: Internal Medicine

## 2016-01-08 ENCOUNTER — Encounter: Admit: 2016-01-08 | Discharge: 2016-01-08 | Payer: MEDICARE | Primary: Internal Medicine

## 2016-01-09 ENCOUNTER — Encounter: Admit: 2016-01-09 | Discharge: 2016-01-09 | Payer: MEDICARE | Primary: Internal Medicine

## 2016-01-09 NOTE — Progress Notes (Signed)
Received message from patient stating brother is having surgery and she will need to cancel appointemnt on 01/16/16 since he is her transportation.. Returned call and spoke with patient after verifying name and DOB. Patient ask this Clinical research associatewriter to speak with home health. Carollee HerterShannon, OT with [facility] informed this writer patient is currently on 4 L NC and with exertion sats drop to 70%.  Carollee HerterShannon advised patient has medicaid and she will be asking to see if she qualifies for personal care aid. This Clinical research associatewriter advised since patient that patient has transportation services from [employer] with [employer]. Contact number is  [phone].      1040 spoke with Dr. Raiford Simmondsomah and discussed patient. Verbal order to have patient increase oxygen to 5L do not go above 5 L,  limit activity, schedule to see Dr. Jayme CloudGonzalez and PCP asap.     1050 spoke with Carollee HerterShannon and gave verbal orders per Dr. Raiford Simmondsomah of increase oxygen to 5 L NC, not to increase above 5 L, limit activity and schedule ASAP appointments with Dr. Jayme CloudGonzalez and Dr. Raiford Simmondsomah. Contact information provided for Dr. Jayme CloudGonzalez.  Patient scheduled to see PCP 01/15/16 @ 1215.     1232 Spoke with patient after verifying name and DOB to ensure she understood orders from Dr. Raiford Simmondsomah.  Patient verified she completed Levaquin on Monday. She verbalized understanding of increase in oxygen, need for ASAP appointments with Drs. Jayme CloudGonzalez and Domah. She has contact information [employer] and will call and set up in case brother can not bring to appointment on 01/15/16. She is calling to set up ASAP with Dr. Jayme CloudGonzalez and calling social worker to set up evaluation for personal care thru medicaid. Patient was restarted on Amaryl at discharge from hospital and is monitoring blood sugar. Denies any hypoglycemic episodes. She will bring log to appointment and will call office for BS below 80.

## 2016-01-11 ENCOUNTER — Encounter: Admit: 2016-01-11 | Discharge: 2016-01-11 | Payer: MEDICARE | Primary: Internal Medicine

## 2016-01-11 NOTE — Progress Notes (Signed)
Spoke with patient after verifying name and DOB. Patient reports wearing oxygen but using 4 L since her concentrator only goes to 4 L. Denies feeling SOB. She has called transportation and it is set up for Tuesday's appointment with Dr. Raiford Simmondsomah.She has not called Dr. Jayme CloudGonzalez office to schedule appointment. She has home health scheduled to come out today and will call after appointment. This Clinical research associatewriter will follow up with patient at appointment regarding Dr. Jayme CloudGonzalez appointment date.

## 2016-01-15 ENCOUNTER — Ambulatory Visit: Admit: 2016-01-15 | Discharge: 2016-01-15 | Payer: MEDICARE | Attending: Internal Medicine | Primary: Internal Medicine

## 2016-01-15 ENCOUNTER — Encounter: Primary: Internal Medicine

## 2016-01-15 DIAGNOSIS — J418 Mixed simple and mucopurulent chronic bronchitis: Secondary | ICD-10-CM

## 2016-01-15 NOTE — Progress Notes (Signed)
Spoke with patient after verifying name and DOB before appointment. Patient has not scheduled appointment with Dr. Jayme CloudGonzalez due to transportation issue.  Patient informed this Clinical research associatewriter she did call [employer] and was told they could not come out due to her insurance. Patient has medicare and medicaid. Patient brother normally is her driver but he is having surgery tomorrow,01/16/16. Patient currently using oxygen at 4 L.. Patient complains of decrease in activity but participating with home health. Will follow up with patient tomorrow regarding PCP plan from today's visit.      1430 discussed case with supervisor. Plan is to speak with patient has neighbor, friends, or church family that can help with transportation. IF none is patient willing/able to pay out of pocket for taxi/uber.

## 2016-01-15 NOTE — Patient Instructions (Addendum)
Pneumonia: Care Instructions  Your Care Instructions    Pneumonia is an infection of the lungs. Most cases are caused by infections from bacteria or viruses.  Pneumonia may be mild or very severe. If it is caused by bacteria, you will be treated with antibiotics. It may take a few weeks to a few months to recover fully from pneumonia, depending on how sick you were and whether your overall health is good.  Follow-up care is a key part of your treatment and safety. Be sure to make and go to all appointments, and call your doctor if you are having problems. It???s also a good idea to know your test results and keep a list of the medicines you take.  How can you care for yourself at home?  ?? Take your antibiotics exactly as directed. Do not stop taking the medicine just because you are feeling better. You need to take the full course of antibiotics.  ?? Take your medicines exactly as prescribed. Call your doctor if you think you are having a problem with your medicine.  ?? Get plenty of rest and sleep. You may feel weak and tired for a while, but your energy level will improve with time.  ?? To prevent dehydration, drink plenty of fluids, enough so that your urine is light yellow or clear like water. Choose water and other caffeine-free clear liquids until you feel better. If you have kidney, heart, or liver disease and have to limit fluids, talk with your doctor before you increase the amount of fluids you drink.  ?? Take care of your cough so you can rest. A cough that brings up mucus from your lungs is common with pneumonia. It is one way your body gets rid of the infection. But if coughing keeps you from resting or causes severe fatigue and chest-wall pain, talk to your doctor. He or she may suggest that you take a medicine to reduce the cough.  ?? Use a vaporizer or humidifier to add moisture to your bedroom. Follow the directions for cleaning the machine.   ?? Do not smoke or allow others to smoke around you. Smoke will make your cough last longer. If you need help quitting, talk to your doctor about stop-smoking programs and medicines. These can increase your chances of quitting for good.  ?? Take an over-the-counter pain medicine, such as acetaminophen (Tylenol), ibuprofen (Advil, Motrin), or naproxen (Aleve). Read and follow all instructions on the label.  ?? Do not take two or more pain medicines at the same time unless the doctor told you to. Many pain medicines have acetaminophen, which is Tylenol. Too much acetaminophen (Tylenol) can be harmful.  ?? If you were given a spirometer to measure how well your lungs are working, use it as instructed. This can help your doctor tell how your recovery is going.  ?? To prevent pneumonia in the future, talk to your doctor about getting a flu vaccine (once a year) and a pneumococcal vaccine (one time only for most people).  When should you call for help?  Call 911 anytime you think you may need emergency care. For example, call if:  ?? You have severe trouble breathing.  Call your doctor now or seek immediate medical care if:  ?? You cough up dark Tadros or bloody mucus (sputum).  ?? You have new or worse trouble breathing.  ?? You are dizzy or lightheaded, or you feel like you may faint.  Watch closely for changes in your health,   and be sure to contact your doctor if:  ?? You have a new or higher fever.  ?? You are coughing more deeply or more often.  ?? You are not getting better after 2 days (48 hours).  ?? You do not get better as expected.  Where can you learn more?  Go to http://www.healthwise.net/GoodHelpConnections.  Enter D336 in the search box to learn more about "Pneumonia: Care Instructions."  Current as of: August 25, 2015  Content Version: 11.3  ?? 2006-2017 Healthwise, Incorporated. Care instructions adapted under license by [employer] (which disclaims liability or warranty  for this information). If you have questions about a medical condition or this instruction, always ask your healthcare professional. Healthwise, Incorporated disclaims any warranty or liability for your use of this information.

## 2016-01-15 NOTE — Progress Notes (Signed)
Chief Complaint   Patient presents with   ??? Hospital Follow Up     2 weeks ago in [facility] with pneumonia and hypoxia

## 2016-01-15 NOTE — Progress Notes (Signed)
Chief Complaint   Patient presents with   ??? Hospital Follow Up     [facility] ago [facility] with pneumonia and hypoxia     HPI:  Brittney MourningMary Shelton is a 65 y.o. 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  [facility] was [facility] weeks ago with copd exacerbation due to pneumonia and hypoxia  Today, she is doing fine but when she walks a distance she gets short winded. She has completed antibiotics.  She is on 4L oxygen with oxygen saturation of 63% that improved to 89%. Patient has not been able to see a pulmonologist   due to lack of  Transportation. The [facility] is working to find a means for pt to get her appointment.    Review of Systems  As per     Past Medical History:   Diagnosis Date   ??? Chronic obstructive pulmonary disease (HCC)    ??? Congestive heart failure (HCC)    ??? Diabetes (HCC)    ??? Hypertension      History reviewed. No pertinent surgical history.  Social History     Social History   ??? Marital status: SINGLE     Spouse name: N/A   ??? Number of children: N/A   ??? Years of education: N/A     Social History Main Topics   ??? Smoking status: Never Smoker   ??? Smokeless tobacco: Never Used   ??? Alcohol use No   ??? Drug use: No   ??? Sexual activity: No     Current Outpatient Prescriptions   Medication Sig Dispense Refill   ??? furosemide (LASIX) 40 mg tablet TAKE 1 TABLET BY MOUTH TWICE DAILY FOR SWELLING 180 Tab 0   ??? glimepiride (AMARYL) 4 mg tablet TK 1 T PO ONCE D QAM  5   ??? cholecalciferol, VITAMIN D3, (VITAMIN D3) 5,000 unit tab tablet Take 1 Tab by mouth daily. 30 Tab 5   ??? pravastatin (PRAVACHOL) 20 mg tablet TAKE 1 TABLET BY MOUTH ONCE DAILY AT NIGHT 90 Tab 5   ??? metFORMIN (GLUCOPHAGE) 500 mg tablet TAKE 1 TABLET BY MOUTH TWICE DAILY WITH MEALS FOR DIABETES 180 Tab 5   ??? amLODIPine (NORVASC) 10 mg tablet TAKE 1 TABLET BY MOUTH DAILY 90 Tab 3   ??? albuterol-ipratropium (DUO-NEB) 2.5 mg-0.5 mg/3 ml nebu Take 3 mL by inhalation three (3) times daily.      ??? albuterol (PROVENTIL HFA, VENTOLIN HFA, PROAIR HFA) 90 mcg/actuation inhaler Take 1 Puff by inhalation every six (6) hours as needed for Wheezing. 1 Inhaler 0   ??? metoprolol succinate (TOPROL-XL) 200 mg XL tablet TAKE 1 TABLET BY MOUTH DAILY FOR HIGH BLOOD PRESSURE 90 Tab 5   ??? lisinopril (PRINIVIL, ZESTRIL) 40 mg tablet TAKE 1 TABLET BY MOUTH ONCE DAILY 90 Tab 5   ??? isosorbide dinitrate (ISORDIL) 30 mg tablet TAKE 1 TABLET BY MOUTH THREE TIMES DAILY 270 Tab 5   ??? cloNIDine HCl (CATAPRES) 0.1 mg tablet TAKE 1 TABLET BY MOUTH ONCE DAILY 90 Tab 5   ??? aspirin 81 mg chewable tablet Take 81 mg by mouth daily.     ??? Oxygen Take 4 L by inhalation continuous.     ??? cholecalciferol (VITAMIN D3) 5,000 unit capsule TK 1 T PO ONCE D  5     No Known Allergies    Objective:  Visit Vitals   ??? BP 137/67   ??? Pulse 86   ???  Temp 96.6 ??F (35.9 ??C) (Oral)   ??? Resp (!) 32   ??? Ht 5\' 9"  (1.753 m)   ??? Wt 321 lb 12.8 oz (146 kg)   ??? SpO2 (!) 89%   ??? BMI 47.52 kg/m2     Physical Exam:   General appearance - alert, well appearing, in no distress  Mental status - alert, oriented to person, place, and time  EYE-PERRL, EOMI  Neck - supple, no significant adenopathy   Chest - clear to auscultation, no wheezes, rales or rhonchi  Heart - normal rate, regular rhythm, normal blood pressure  Abdomen - obese, soft, nontender, nondistended, no organomegaly  Ext-peripheral pulses normal, no pedal edema  Neuro -alert, oriented, normal speech, no focal findings    Assessment/Plan:    ICD-10-CM ICD-9-CM    1. Mixed simple and mucopurulent chronic bronchitis (HCC) J41.8 491.1 REFERRAL TO PULMONARY DISEASE   2. Pneumonia due to other aerobic gram-negative bacteria (HCC) J15.6 482.83 REFERRAL TO PULMONARY DISEASE   3. Requires continuous at home supplemental oxygen Z99.81 V46.2    4. Hypoxia R09.02 799.02      Patient Instructions        Pneumonia: Care Instructions  Your Care Instructions    Pneumonia is an infection of the lungs. Most cases are caused by  infections from bacteria or viruses.  Pneumonia may be mild or very severe. If it is caused by bacteria, you will be treated with antibiotics. It may take a few weeks to a few months to recover fully from pneumonia, depending on how sick you were and whether your overall health is good.  Follow-up care is a key part of your treatment and safety. Be sure to make and go to all appointments, and call your doctor if you are having problems. It???s also a good idea to know your test results and keep a list of the medicines you take.  How can you care for yourself at home?  ?? Take your antibiotics exactly as directed. Do not stop taking the medicine just because you are feeling better. You need to take the full course of antibiotics.  ?? Take your medicines exactly as prescribed. Call your doctor if you think you are having a problem with your medicine.  ?? Get plenty of rest and sleep. You may feel weak and tired for a while, but your energy level will improve with time.  ?? To prevent dehydration, drink plenty of fluids, enough so that your urine is light yellow or clear like water. Choose water and other caffeine-free clear liquids until you feel better. If you have kidney, heart, or liver disease and have to limit fluids, talk with your doctor before you increase the amount of fluids you drink.  ?? Take care of your cough so you can rest. A cough that brings up mucus from your lungs is common with pneumonia. It is one way your body gets rid of the infection. But if coughing keeps you from resting or causes severe fatigue and chest-wall pain, talk to your doctor. He or she may suggest that you take a medicine to reduce the cough.  ?? Use a vaporizer or humidifier to add moisture to your bedroom. Follow the directions for cleaning the machine.  ?? Do not smoke or allow others to smoke around you. Smoke will make your cough last longer. If you need help quitting, talk to your doctor about  stop-smoking programs and medicines. These can increase your chances of quitting for good.  ??  Take an over-the-counter pain medicine, such as acetaminophen (Tylenol), ibuprofen (Advil, Motrin), or naproxen (Aleve). Read and follow all instructions on the label.  ?? Do not take two or more pain medicines at the same time unless the doctor told you to. Many pain medicines have acetaminophen, which is Tylenol. Too much acetaminophen (Tylenol) can be harmful.  ?? If you were given a spirometer to measure how well your lungs are working, use it as instructed. This can help your doctor tell how your recovery is going.  ?? To prevent pneumonia in the future, talk to your doctor about getting a flu vaccine (once a year) and a pneumococcal vaccine (one time only for most people).  When should you call for help?  Call 911 anytime you think you may need emergency care. For example, call if:  ?? You have severe trouble breathing.  Call your doctor now or seek immediate medical care if:  ?? You cough up dark Ruz or bloody mucus (sputum).  ?? You have new or worse trouble breathing.  ?? You are dizzy or lightheaded, or you feel like you may faint.  Watch closely for changes in your health, and be sure to contact your doctor if:  ?? You have a new or higher fever.  ?? You are coughing more deeply or more often.  ?? You are not getting better after 2 days (48 hours).  ?? You do not get better as expected.  Where can you learn more?  Go to [employer].  Enter [phone] in the search box to learn more about "Pneumonia: Care Instructions."  Current as of: August 25, 2015  Content Version: 11.3  ?? 2006-2017 Healthwise, Incorporated. Care instructions adapted under license by [employer] (which disclaims liability or warranty for this information). If you have questions about a medical condition or this instruction, always ask your healthcare professional. Healthwise,  Incorporated disclaims any warranty or liability for your use of this information.      Follow-up Disposition:  Return in about 3 months (around 04/16/2016), or if symptoms worsen or fail to improve, for routine follow up.

## 2016-01-16 ENCOUNTER — Encounter: Attending: Internal Medicine | Primary: Internal Medicine

## 2016-01-16 ENCOUNTER — Encounter: Admit: 2016-01-16 | Discharge: 2016-01-16 | Payer: MEDICARE | Primary: Internal Medicine

## 2016-01-16 ENCOUNTER — Encounter: Primary: Internal Medicine

## 2016-01-16 NOTE — Progress Notes (Signed)
Spoke with patient after verifying name and DOB regarding oxygen level. Patient reports feeling fine and weight today is 320 . Discussed transportation since brother, Alden Serverrnest, is having surgery today, 01/16/16. And medicaid doe not provide transportation for patient. This Clinical research associatewriter asked patient is she has church family  That may help with transportation. Patient reports not having church family since moving to [place] however patient did state she has 2 friends, another brother that lives outside of [place], and a niece that drives. She also has a nephew but he does not drive. She will be speaking with friends and family regarding help with transportation and how far in advance they would need notice of appointments. After speaking with friends and family regarding transportation patient will call Dr. Jayme CloudGonzalez office and schedule appointment.  Patient continues to limits activity and nephew is helping with household chores. She will be contacting Child psychotherapistsocial worker, Ms. Cooper regarding personal care aid qualifications.

## 2016-01-17 ENCOUNTER — Encounter: Admit: 2016-01-17 | Discharge: 2016-01-17 | Payer: MEDICARE | Primary: Internal Medicine

## 2016-01-17 ENCOUNTER — Encounter: Primary: Internal Medicine

## 2016-01-18 ENCOUNTER — Encounter: Primary: Internal Medicine

## 2016-01-18 NOTE — Progress Notes (Signed)
Unable to reach patient. This Clinical research associatewriter contact information left on voicemail.     [phone] Dr. Jayme CloudGonzalez office contacted. This Clinical research associatewriter informed patient has appointment scheduled on 01/25/16 at 1115. Will contact patient to ensure she has transportation to appointment and give appointment reminder.

## 2016-01-21 NOTE — Progress Notes (Signed)
Spoke with patient after verifying name and DOB. Patient informed this Clinical research associatewriter she is feeling okay.continues to conserve energy, wearing oxygen, she has appointments with eye doctor, 01/23/16 and pulmonary on 01/25/16. Her brother is providing transportation.  Denies increase in oxygen use and speaking in complete sentences. She is to contact her social worker to ask about personal care aides thru [employer].

## 2016-01-23 ENCOUNTER — Inpatient Hospital Stay
Admit: 2016-01-23 | Discharge: 2016-01-27 | Disposition: A | Discharge status: Home Health | Payer: MEDICARE | Attending: Internal Medicine | Admitting: Internal Medicine

## 2016-01-23 ENCOUNTER — Emergency Department: Admit: 2016-01-23 | Payer: MEDICARE | Primary: Internal Medicine

## 2016-01-23 ENCOUNTER — Encounter: Primary: Internal Medicine

## 2016-01-23 DIAGNOSIS — I110 Hypertensive heart disease with heart failure: Principal | ICD-10-CM

## 2016-01-23 LAB — CBC WITH AUTOMATED DIFF
ABS. BASOPHILS: 0 10*3/uL (ref 0.0–0.1)
ABS. EOSINOPHILS: 0.2 10*3/uL (ref 0.0–0.4)
ABS. LYMPHOCYTES: 0.7 10*3/uL — ABNORMAL LOW (ref 0.8–3.5)
ABS. MONOCYTES: 0.5 10*3/uL (ref 0.0–1.0)
ABS. NEUTROPHILS: 4.5 10*3/uL (ref 1.8–8.0)
BASOPHILS: 0 % (ref 0–1)
EOSINOPHILS: 4 % (ref 0–7)
HCT: 38 % (ref 35.0–47.0)
HGB: 11.8 g/dL (ref 11.5–16.0)
LYMPHOCYTES: 12 % (ref 12–49)
MCH: 28.2 PG (ref 26.0–34.0)
MCHC: 31.1 g/dL (ref 30.0–36.5)
MCV: 90.7 FL (ref 80.0–99.0)
MONOCYTES: 8 % (ref 5–13)
NEUTROPHILS: 76 % — ABNORMAL HIGH (ref 32–75)
PLATELET: 182 10*3/uL (ref 150–400)
RBC: 4.19 M/uL (ref 3.80–5.20)
RDW: 17.2 % — ABNORMAL HIGH (ref 11.5–14.5)
WBC: 5.9 10*3/uL (ref 3.6–11.0)

## 2016-01-23 LAB — BLOOD GAS, ARTERIAL
BASE EXCESS: 7.3 mmol/L
BICARBONATE: 34 mmol/L — ABNORMAL HIGH (ref 22–26)
O2 FLOW RATE: 4 L/min
O2 SAT: 88 % — ABNORMAL LOW (ref 92–97)
PCO2: 53 mmHg — ABNORMAL HIGH (ref 35.0–45.0)
PO2: 53 mmHg — ABNORMAL LOW (ref 80–100)
pH: 7.42 (ref 7.35–7.45)

## 2016-01-23 LAB — METABOLIC PANEL, COMPREHENSIVE
A-G Ratio: 0.8 — ABNORMAL LOW (ref 1.1–2.2)
ALT (SGPT): 10 U/L — ABNORMAL LOW (ref 12–78)
AST (SGOT): 10 U/L — ABNORMAL LOW (ref 15–37)
Albumin: 3.4 g/dL — ABNORMAL LOW (ref 3.5–5.0)
Alk. phosphatase: 70 U/L (ref 45–117)
Anion gap: 3 mmol/L — ABNORMAL LOW (ref 5–15)
BUN/Creatinine ratio: 19 (ref 12–20)
BUN: 18 MG/DL (ref 6–20)
Bilirubin, total: 1.5 MG/DL — ABNORMAL HIGH (ref 0.2–1.0)
CO2: 41 mmol/L — CR (ref 21–32)
Calcium: 8.2 MG/DL — ABNORMAL LOW (ref 8.5–10.1)
Chloride: 94 mmol/L — ABNORMAL LOW (ref 97–108)
Creatinine: 0.94 MG/DL (ref 0.55–1.02)
GFR est AA: >60 mL/min/{1.73_m2} (ref >60)
GFR est non-AA: 59 mL/min/{1.73_m2} — ABNORMAL LOW (ref >60)
Globulin: 4.1 g/dL — ABNORMAL HIGH (ref 2.0–4.0)
Glucose: 128 mg/dL — ABNORMAL HIGH (ref 65–100)
Potassium: 3.3 mmol/L — ABNORMAL LOW (ref 3.5–5.1)
Protein, total: 7.5 g/dL (ref 6.4–8.2)
Sodium: 138 mmol/L (ref 136–145)

## 2016-01-23 LAB — MAGNESIUM: Magnesium: 1.7 mg/dL (ref 1.6–2.4)

## 2016-01-23 LAB — GLUCOSE, POC: Glucose (POC): 84 mg/dL (ref 65–100)

## 2016-01-23 LAB — NT-PRO BNP: NT pro-BNP: 1923 PG/ML — ABNORMAL HIGH (ref 0–125)

## 2016-01-23 LAB — TROPONIN I: Troponin-I, Qt.: <0.04 ng/mL (ref <0.05)

## 2016-01-23 LAB — CK W/ REFLX CKMB: CK: 56 U/L (ref 26–192)

## 2016-01-23 MED ORDER — SODIUM CHLORIDE 0.9 % IJ SYRG
Freq: Three times a day (TID) | INTRAMUSCULAR | Status: DC
Start: 2016-01-23 — End: 2016-01-27
  Administered 2016-01-23 – 2016-01-27 (×15): via INTRAVENOUS

## 2016-01-23 MED ORDER — ISOSORBIDE DINITRATE 20 MG TAB
20 mg | Freq: Three times a day (TID) | ORAL | Status: DC
Start: 2016-01-23 — End: 2016-01-27
  Administered 2016-01-23 – 2016-01-27 (×11): via ORAL

## 2016-01-23 MED ORDER — PREDNISONE 20 MG TAB
20 mg | Freq: Every day | ORAL | Status: DC
Start: 2016-01-23 — End: 2016-01-24
  Administered 2016-01-23 – 2016-01-24 (×2): via ORAL

## 2016-01-23 MED ORDER — DEXTROSE 10% IN WATER (D10W) IV
10 % | INTRAVENOUS | Status: DC | PRN
Start: 2016-01-23 — End: 2016-01-27

## 2016-01-23 MED ORDER — LISINOPRIL 20 MG TAB
20 mg | Freq: Every day | ORAL | Status: DC
Start: 2016-01-23 — End: 2016-01-27
  Administered 2016-01-24 – 2016-01-27 (×4): via ORAL

## 2016-01-23 MED ORDER — INSULIN LISPRO 100 UNIT/ML INJECTION
100 unit/mL | Freq: Four times a day (QID) | SUBCUTANEOUS | Status: DC
Start: 2016-01-23 — End: 2016-01-27
  Administered 2016-01-24 – 2016-01-26 (×4): via SUBCUTANEOUS

## 2016-01-23 MED ORDER — GLIMEPIRIDE 4 MG TAB
4 mg | Freq: Every day | ORAL | Status: DC
Start: 2016-01-23 — End: 2016-01-25
  Administered 2016-01-24: 14:00:00 via ORAL

## 2016-01-23 MED ORDER — FUROSEMIDE 10 MG/ML IJ SOLN
10 mg/mL | Freq: Two times a day (BID) | INTRAMUSCULAR | Status: DC
Start: 2016-01-23 — End: 2016-01-24
  Administered 2016-01-24: 14:00:00 via INTRAVENOUS

## 2016-01-23 MED ORDER — CHOLECALCIFEROL (VITAMIN D3) 1,000 UNIT (25 MCG) TAB
Freq: Every day | ORAL | Status: DC
Start: 2016-01-23 — End: 2016-01-27
  Administered 2016-01-24 – 2016-01-27 (×4): via ORAL

## 2016-01-23 MED ORDER — GUAIFENESIN 600 MG TABLET,EXTENDED RELEASE BIPHASIC 12 HR
600 mg | Freq: Two times a day (BID) | ORAL | Status: DC
Start: 2016-01-23 — End: 2016-01-27
  Administered 2016-01-24 – 2016-01-27 (×8): via ORAL

## 2016-01-23 MED ORDER — DEXTROSE 50% IN WATER (D50W) IV SYRG
INTRAVENOUS | Status: DC | PRN
Start: 2016-01-23 — End: 2016-01-23

## 2016-01-23 MED ORDER — GLUCOSE 4 GRAM CHEWABLE TAB
4 gram | ORAL | Status: DC | PRN
Start: 2016-01-23 — End: 2016-01-27
  Administered 2016-01-25: 16:00:00 via ORAL

## 2016-01-23 MED ORDER — POTASSIUM CHLORIDE SR 10 MEQ TAB
10 mEq | Freq: Two times a day (BID) | ORAL | Status: DC
Start: 2016-01-23 — End: 2016-01-25
  Administered 2016-01-23 – 2016-01-25 (×4): via ORAL

## 2016-01-23 MED ORDER — AZITHROMYCIN 500 MG IV SOLUTION
500 mg | INTRAVENOUS | Status: DC
Start: 2016-01-23 — End: 2016-01-24
  Administered 2016-01-23: 23:00:00 via INTRAVENOUS

## 2016-01-23 MED ORDER — SODIUM CHLORIDE 0.9 % IJ SYRG
INTRAMUSCULAR | Status: DC | PRN
Start: 2016-01-23 — End: 2016-01-27

## 2016-01-23 MED ORDER — METOPROLOL SUCCINATE SR 50 MG 24 HR TAB
50 mg | Freq: Every day | ORAL | Status: DC
Start: 2016-01-23 — End: 2016-01-27
  Administered 2016-01-24 – 2016-01-27 (×4): via ORAL

## 2016-01-23 MED ORDER — CLONIDINE 0.1 MG TAB
0.1 mg | Freq: Every day | ORAL | Status: DC
Start: 2016-01-23 — End: 2016-01-24
  Administered 2016-01-24: 14:00:00 via ORAL

## 2016-01-23 MED ORDER — AMLODIPINE 5 MG TAB
5 mg | Freq: Every day | ORAL | Status: DC
Start: 2016-01-23 — End: 2016-01-27
  Administered 2016-01-24 – 2016-01-27 (×4): via ORAL

## 2016-01-23 MED ORDER — ASPIRIN 81 MG CHEWABLE TAB
81 mg | Freq: Every day | ORAL | Status: DC
Start: 2016-01-23 — End: 2016-01-27
  Administered 2016-01-24 – 2016-01-27 (×4): via ORAL

## 2016-01-23 MED ORDER — IPRATROPIUM-ALBUTEROL 2.5 MG-0.5 MG/3 ML NEB SOLUTION
2.5 mg-0.5 mg/3 ml | Freq: Four times a day (QID) | RESPIRATORY_TRACT | Status: DC
Start: 2016-01-23 — End: 2016-01-24
  Administered 2016-01-23 – 2016-01-24 (×5): via RESPIRATORY_TRACT

## 2016-01-23 MED ORDER — PRAVASTATIN 10 MG TAB
10 mg | Freq: Every evening | ORAL | Status: DC
Start: 2016-01-23 — End: 2016-01-27
  Administered 2016-01-24 – 2016-01-27 (×5): via ORAL

## 2016-01-23 MED ORDER — GLUCAGON 1 MG INJECTION
1 mg | INTRAMUSCULAR | Status: DC | PRN
Start: 2016-01-23 — End: 2016-01-27

## 2016-01-23 MED ORDER — ENOXAPARIN 40 MG/0.4 ML SUB-Q SYRINGE
40 mg/0.4 mL | Freq: Two times a day (BID) | SUBCUTANEOUS | Status: DC
Start: 2016-01-23 — End: 2016-01-27
  Administered 2016-01-23 – 2016-01-27 (×8): via SUBCUTANEOUS

## 2016-01-23 MED ORDER — FUROSEMIDE 10 MG/ML IJ SOLN
10 mg/mL | INTRAMUSCULAR | Status: AC
Start: 2016-01-23 — End: 2016-01-23
  Administered 2016-01-23: 20:00:00 via INTRAVENOUS

## 2016-01-23 MED FILL — POTASSIUM CHLORIDE SR 10 MEQ TAB: 10 mEq | ORAL | Qty: 1

## 2016-01-23 MED FILL — ISOSORBIDE DINITRATE 20 MG TAB: 20 mg | ORAL | Qty: 2

## 2016-01-23 MED FILL — IPRATROPIUM-ALBUTEROL 2.5 MG-0.5 MG/3 ML NEB SOLUTION: 2.5 mg-0.5 mg/3 ml | RESPIRATORY_TRACT | Qty: 3

## 2016-01-23 MED FILL — DEXTROSE 10% IN WATER (D10W) IV: 10 % | INTRAVENOUS | Qty: 250

## 2016-01-23 MED FILL — BD POSIFLUSH NORMAL SALINE 0.9 % INJECTION SYRINGE: INTRAMUSCULAR | Qty: 10

## 2016-01-23 MED FILL — VITAMIN D3 25 MCG (1,000 UNIT) TABLET: 25 mcg (1,000 unit) | ORAL | Qty: 5

## 2016-01-23 MED FILL — ISOSORBIDE DINITRATE 10 MG TAB: 10 mg | ORAL | Qty: 1

## 2016-01-23 MED FILL — FUROSEMIDE 10 MG/ML IJ SOLN: 10 mg/mL | INTRAMUSCULAR | Qty: 4

## 2016-01-23 MED FILL — PREDNISONE 20 MG TAB: 20 mg | ORAL | Qty: 3

## 2016-01-23 MED FILL — AZITHROMYCIN 500 MG IV SOLUTION: 500 mg | INTRAVENOUS | Qty: 5

## 2016-01-23 MED FILL — GLIMEPIRIDE 4 MG TAB: 4 mg | ORAL | Qty: 1

## 2016-01-23 MED FILL — LOVENOX 40 MG/0.4 ML SUBCUTANEOUS SYRINGE: 40 mg/0.4 mL | SUBCUTANEOUS | Qty: 0.4

## 2016-01-23 NOTE — ED Notes (Signed)
Pt. Back from x-ray. Pt. Placed in position of comfort with call bell in reach.

## 2016-01-23 NOTE — Progress Notes (Signed)
ADULT PROTOCOL: JET AEROSOL ASSESSMENT    Patient  Brittney Shelton     65 y.o.   female     01/23/2016  1941 PM    Breath Sounds Pre Procedure: Right Breath Sounds: Diminished                               Left Breath Sounds: Diminished    Breath Sounds Post Procedure: Right Breath Sounds: Diminished                                 Left Breath Sounds: Diminished    Breathing pattern: Pre procedure Breathing Pattern: Regular          Post procedure Breathing Pattern: Regular    Heart Rate: Pre procedure Pulse: 70           Post procedure Pulse: 75    Resp Rate: Pre procedure Respirations: 20           Post procedure Respirations: 20    Peak Flow: Pre bronchodilator   n/a          Post bronchodilator   n/a    FVC/FEV1: n/a    Incentive Spirometry:   n/a          Cough: Pre procedure Cough: Non-productive               Post procedure Cough: Non-productive    Suctioned: NO    Sputum: Pre procedure  none                 Post procedure  none    Oxygen: O2 Device: Nasal cannula   Flow rate (L/min) 6     Changed: YES    SpO2: Pre procedure SpO2: 95 %   with oxygen              Post procedure SpO2: 93 %  with oxygen    Nebulizer Therapy: Current medications Aerosolized Medications: DuoNeb      Changed: NO    Smoking History:non smoker    Problem List:   Patient Active Problem List   Diagnosis Code   ??? Hypertension, uncontrolled I10   ??? Well controlled type 2 diabetes mellitus (HCC) E11.9   ??? Hypercholesterolemia E78.00   ??? 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 COPD (chronic obstructive pulmonary disease) (HCC) J44.9   ??? Obesity E66.9   ??? Requires continuous at home supplemental oxygen Z99.81   ??? Dependence on supplemental oxygen Z99.81   ??? ACP (advance care planning) Z71.89   ??? Counseling regarding advanced care planning and goals of care Z71.89   ??? Dyspnea R06.00   ??? Acute on chronic respiratory failure with hypoxia and hypercapnia (HCC) J96.21, J96.22    ??? Acute on chronic diastolic (congestive) heart failure (HCC) I50.33       Respiratory Therapist: Okey DupreBonny R Miragliotta, RT

## 2016-01-23 NOTE — ED Notes (Signed)
Pt. Off unit to x-ray.

## 2016-01-23 NOTE — ED Notes (Signed)
Spoke with Tab from respiratory.  To come do ABG at this time.

## 2016-01-23 NOTE — Progress Notes (Signed)
Bedside and Verbal shift change report given to Hong KongKristy (Cabin crewoncoming nurse) by Waldo LaineKaitlin (offgoing nurse). Report included the following information SBAR, Kardex, Intake/Output, MAR and Recent Results.

## 2016-01-23 NOTE — ED Notes (Signed)
Respiratory at bedside to draw ABG. Patient removed from venti mask at this time and on 4L nasal cannula.  Pt. sats at 97 %.

## 2016-01-23 NOTE — ED Notes (Signed)
Pt. Presents to ED today after being seen at here eye doctor.  PT. Had a sudden onset of SOB and was told to come to the ED.  Pt. Placed on NRB and sats up to 100%. However, patient has a history of COPD and writer spoke with respiratory and suggested placing patient on venti mask at 50%.  Pt. Wears 4L O2 at home.  Pt. Placed on monitor x3.

## 2016-01-23 NOTE — Progress Notes (Signed)
Wanted to follow up with patient after eye doctor appointment. Per chart patient is currently in ED cc SOB. Will follow up with patient after discharge. Patient is scheduled to see Dr. Jayme CloudGonzalez 01/25/16.

## 2016-01-23 NOTE — Progress Notes (Signed)
Pharmacy Clarification of the Prior to Admission Medication Regimen Retrospective to the Admission Medication Reconciliation    The patient was interviewed regarding clarification of the prior to admission medication regimen and was questioned regarding use of any other inhalers, topical products, over the counter medications, herbal medications, vitamin products or ophthalmic/nasal/otic medication use.     Information Obtained From: Patient, RX Query    Recommendations/Findings:   The following amendments were made to the patient's active medication list on [facility] at [facility]:     1) Additions:   ? Naproxen sodium (ALEVE) 220 mg tablet    2) Removals: NONE    3) Changes:  ? metFORMIN (GLUCOPHAGE) 500 mg tablet (Old regimen: 500 mg BID /New regimen: 500 mg QAM PRN if blood sugar > 150, and 500 mg QHS)    4) Pertinent Pharmacy Findings:  ? aspirin 81 mg chewable tablet: Patient states that she buys chewable tablets, but swallows them instead of chew them.     PTA medication list was corrected to the following:     Prior to Admission Medications   Prescriptions Last Dose Informant Patient Reported? Taking?   albuterol (PROVENTIL HFA, VENTOLIN HFA, PROAIR HFA) 90 mcg/actuation inhaler 01/23/2016 at Unknown time  No Yes   Sig: Take 1 Puff by inhalation every six (6) hours as needed for Wheezing.   albuterol-ipratropium (DUO-NEB) 2.5 mg-0.5 mg/3 ml nebu 01/22/2016 at Unknown time  Yes Yes   Sig: Take 3 mL by inhalation three (3) times daily.   amLODIPine (NORVASC) 10 mg tablet 01/23/2016 at Unknown time  No Yes   Sig: TAKE 1 TABLET BY MOUTH DAILY   aspirin 81 mg chewable tablet 01/23/2016 at Unknown time  Yes Yes   Sig: Take 81 mg by mouth daily.   cholecalciferol, VITAMIN D3, (VITAMIN D3) 5,000 unit tab tablet 01/23/2016 at Unknown time  No Yes   Sig: Take 1 Tab by mouth daily.   cloNIDine HCl (CATAPRES) 0.1 mg tablet 01/23/2016 at Unknown time  No Yes   Sig: TAKE 1 TABLET BY MOUTH ONCE DAILY    furosemide (LASIX) 40 mg tablet 01/23/2016 at Unknown time  No Yes   Sig: TAKE 1 TABLET BY MOUTH TWICE DAILY FOR SWELLING   glimepiride (AMARYL) 4 mg tablet 01/23/2016 at Unknown time  Yes Yes   Sig: Take 4 mg by mouth daily.   isosorbide dinitrate (ISORDIL) 30 mg tablet 01/23/2016 at Unknown time  No Yes   Sig: TAKE 1 TABLET BY MOUTH THREE TIMES DAILY   lisinopril (PRINIVIL, ZESTRIL) 40 mg tablet 01/23/2016 at Unknown time  No Yes   Sig: TAKE 1 TABLET BY MOUTH ONCE DAILY   metFORMIN (GLUCOPHAGE) 500 mg tablet 01/22/2016 at Unknown time  Yes Yes   Sig: Take 500 mg by mouth nightly. Patient takes 500 mg QAM PRN if blood sugar >150, and 500 mg QHS   metFORMIN (GLUCOPHAGE) 500 mg tablet 01/02/2016 at Unknown time  Yes Yes   Sig: Take 500 mg by mouth daily as needed (if blood sugar > 150). Patient takes 500 mg QAM PRN if blood sugar >150, and 500 mg QHS   metoprolol succinate (TOPROL-XL) 200 mg XL tablet 01/23/2016 at Unknown time  No Yes   Sig: TAKE 1 TABLET BY MOUTH DAILY FOR HIGH BLOOD PRESSURE   naproxen sodium (ALEVE) 220 mg tablet 01/21/2016 at Unknown time  Yes Yes   Sig: Take 440 mg by mouth every twelve (12) hours as needed for Pain.  pravastatin (PRAVACHOL) 20 mg tablet 01/22/2016 at Unknown time  No Yes   Sig: TAKE 1 TABLET BY MOUTH ONCE DAILY AT NIGHT      Facility-Administered Medications: None          Thank you,  Acey LavKailey Van Allen  Medication History Pharmacy Technician

## 2016-01-23 NOTE — H&P (Signed)
Hospitalist Admission Note    NAME: Brittney Shelton   DOB:  04-02-1951   MRN:  [id number]     Date/Time:  01/23/2016 5:28 PM    Patient PCP: Ermalene Postin, MD Pulm= Dr Patsey Berthold  ________________________________________________________________________    My assessment of this patient's clinical condition and my plan of care is as follows.    Assessment / Plan:  Acute on chronic hypoxic & hypercapneic respiratory failure POA - on venti mask at the moment in ER  Likely due to Mild acute COPD exacerbation POA   Acute on Chronic diastolic dysfunction HF EF 60% POA- seems to be only mildly decompensated  CXR= mild pulm edema  K+= 3.3  proBNP 1923  ??  Admit to telemetry bed ([facility] if possible)  Cont  oxygen via venti mask for now- try to taper down to 4L/m via NC as able in next 24-48hrs, try to avoid NRB mask, bipap prn only  Cont IV diuresis for now with lasix IV, s/p IV lasix x 1 in ER  Empiric azithromycin for now  Start on PO Prednisone for COPD + scheduled jet nebs   Daily weight , Is & Os  Add mucinex  ??  ????  DM type II - uncontrolled due to steroids  Continue Amaryl   C/w SSI lispro for now, consider lantus or NPH while on steroids is FS rises >250   ????  HTN  Continue home meds     ????  Hyperlipidemia, cont statin   Lymphedema  Morbid obesity, Body mass index is 50.68 kg/(m^2).  ????  ????  ????  ????  ????  ????  Code Status: DNR as per pt's wishes in ER  Surrogate Decision Maker: brother Brittney Shelton??  ????  DVT Prophylaxis:??lovenox   GI Prophylaxis: not indicated  ????  Baseline: lives with brother and her niece, no children, ambulating with cane; home O2 4 L   Recommended Disposition: Home w/Family + HH PT/OT likely        Subjective:   CHIEF COMPLAINT: Worsening SOB x few days    HISTORY OF PRESENT ILLNESS:     Brittney Shelton is a 65 y.o.  Caucasian female who presents with above complains from home ambulatory.  Pt presents with CC of Worsening SOB x past 1 week  H/o associated Cough (dry) with wheezing    H/o chronically being on home oxygen at 4L/m at all times from COPD- sees Dr Patsey Berthold  Denies h/o orthopnea, PND  H/o associated chronic LE edema  Denies dietary indiscretion , increased fluid intake  Pt on Lasix 40 mg BID as instructed by her PCP    Pt was found to have Mild Pulm Edema on CXR in ER , but wheezing on exam in ER, hypoxia requiring Venti mask on arrival.- pt feeling much better after the Lasix given in ER.    We were asked to admit for work up and evaluation of the above problems.     Past Medical History:   Diagnosis Date   ??? Chronic obstructive pulmonary disease ([place])    ??? Congestive heart failure ([place])    ??? Diabetes ([place])    ??? Hypertension         History reviewed. No pertinent surgical history.    Social History   Substance Use Topics   ??? Smoking status: Never Smoker   ??? Smokeless tobacco: Never Used   ??? Alcohol use No        Family History  Problem Relation Age of Onset   ??? Diabetes Mother    ??? Arthritis-osteo Father    ??? COPD Sister    ??? Diabetes Brother    ??? Diabetes Brother      No Known Allergies     Prior to Admission medications    Medication Sig Start Date End Date Taking? Authorizing Provider   glimepiride (AMARYL) 4 mg tablet Take 4 mg by mouth daily.   Yes Historical Provider   metFORMIN (GLUCOPHAGE) 500 mg tablet Take 500 mg by mouth nightly. Patient takes 500 mg QAM PRN if blood sugar >150, and 500 mg QHS   Yes Historical Provider   metFORMIN (GLUCOPHAGE) 500 mg tablet Take 500 mg by mouth daily as needed (if blood sugar > 150). Patient takes 500 mg QAM PRN if blood sugar >150, and 500 mg QHS   Yes Historical Provider   naproxen sodium (ALEVE) 220 mg tablet Take 440 mg by mouth every twelve (12) hours as needed for Pain.   Yes Historical Provider   furosemide (LASIX) 40 mg tablet TAKE 1 TABLET BY MOUTH TWICE DAILY FOR SWELLING 12/17/15  Yes Nau D Domah, MD   cholecalciferol, VITAMIN D3, (VITAMIN D3) 5,000 unit tab tablet Take 1 Tab by mouth daily. 12/11/15  Yes Ermalene Postin, MD    pravastatin (PRAVACHOL) 20 mg tablet TAKE 1 TABLET BY MOUTH ONCE DAILY AT NIGHT 11/26/15  Yes Nau D Domah, MD   amLODIPine (NORVASC) 10 mg tablet TAKE 1 TABLET BY MOUTH DAILY 08/29/15  Yes Nau D Domah, MD   albuterol-ipratropium (DUO-NEB) 2.5 mg-0.5 mg/3 ml nebu Take 3 mL by inhalation three (3) times daily.   Yes Historical Provider   albuterol (PROVENTIL HFA, VENTOLIN HFA, PROAIR HFA) 90 mcg/actuation inhaler Take 1 Puff by inhalation every six (6) hours as needed for Wheezing. 07/28/15  Yes Malon Kindle, MD   metoprolol succinate (TOPROL-XL) 200 mg XL tablet TAKE 1 TABLET BY MOUTH DAILY FOR HIGH BLOOD PRESSURE 02/19/15  Yes Nau D Domah, MD   lisinopril (PRINIVIL, ZESTRIL) 40 mg tablet TAKE 1 TABLET BY MOUTH ONCE DAILY 02/19/15  Yes Ermalene Postin, MD   isosorbide dinitrate (ISORDIL) 30 mg tablet TAKE 1 TABLET BY MOUTH THREE TIMES DAILY 02/19/15  Yes Nau D Domah, MD   cloNIDine HCl (CATAPRES) 0.1 mg tablet TAKE 1 TABLET BY MOUTH ONCE DAILY 02/19/15  Yes Ermalene Postin, MD   aspirin 81 mg chewable tablet Take 81 mg by mouth daily.   Yes Historical Provider       REVIEW OF SYSTEMS:           Total of 12 systems reviewed as follows:       POSITIVE= underlined text  Negative = text not underlined  General:  fever, chills, sweats, generalized weakness, weight loss/gain,      loss of appetite   Eyes:    blurred vision, eye pain, loss of vision, double vision  ENT:    rhinorrhea, pharyngitis   Respiratory:   cough, sputum production, SOB, DOE, wheezing, pleuritic pain   Cardiology:   chest pain, palpitations, orthopnea, PND, edema, syncope   Gastrointestinal:  abdominal pain , N/V, diarrhea, dysphagia, constipation, bleeding   Genitourinary:  frequency, urgency, dysuria, hematuria, incontinence   Muskuloskeletal :  arthralgia, myalgia, back pain  Hematology:  easy bruising, nose or gum bleeding, lymphadenopathy   Dermatological: rash, ulceration, pruritis, color change / jaundice  Endocrine:   hot flashes or polydipsia  Neurological:  headache, dizziness, confusion, focal weakness, paresthesia,     Speech difficulties, memory loss, gait difficulty  Psychological: Feelings of anxiety, depression, agitation    Objective:   VITALS:    Visit Vitals   ??? BP 153/77   ??? Pulse 65   ??? Temp 97.7 ??F (36.5 ??C)   ??? Resp 18   ??? Ht 5' 6"  (1.676 m)   ??? Wt 145.2 kg (320 lb)   ??? SpO2 94%   ??? BMI 51.65 kg/m2       PHYSICAL EXAM:    General:    Alert, cooperative, no distress, appears stated age.     HEENT: Atraumatic, anicteric sclerae, pink conjunctivae, on venti mask -comfortable +     No oral ulcers, mucosa moist, throat clear, dentition fair  Neck:  Supple, symmetrical,  thyroid: non tender  Lungs:   Expiratory Wheezing mild noted + No rales.  Chest wall:  No tenderness  No Accessory muscle use.  Heart:   Regular  rhythm,  No  murmur   Bilateral LE edema noted +  Abdomen:   Soft, non-tender. Not distended.  Bowel sounds normal  Extremities: No cyanosis.  No clubbing,      Skin turgor normal, Capillary refill normal, Radial dial pulse 2+  Skin:     Not pale.  Not Jaundiced  No rashes   Psych:  Good insight.  Not depressed.  Not anxious or agitated.  Neurologic: EOMs intact. No facial asymmetry. No aphasia or slurred speech. Symmetrical strength, Sensation grossly intact. Alert and oriented X 4.     _______________________________________________________________________  Care Plan discussed with:    Comments   Patient x    Family      RN x    Care Manager                    Consultant:  x ED MD [place]   _______________________________________________________________________  Expected  Disposition:   Home with Family x   HH/PT/OT/RN x   SNF/LTC    [facility]    ________________________________________________________________________  TOTAL TIME:  68 Minutes    Critical Care Provided     Minutes non procedure based      Comments     Reviewed previous records   >50% of visit spent in counseling and coordination of care  Discussion  with patient and/or family and questions answered       ________________________________________________________________________  Signed: Alain Marion, MD    Procedures: see electronic medical records for all procedures/Xrays and details which were not copied into this note but were reviewed prior to creation of Plan.    LAB DATA REVIEWED:    Recent Results (from the past 24 hour(s))   EKG, 12 LEAD, INITIAL    Collection Time: 01/23/16 12:10 PM   Result Value Ref Range    Ventricular Rate 65 BPM    Atrial Rate 65 BPM    P-R Interval 194 ms    QRS Duration 88 ms    Q-T Interval 418 ms    QTC Calculation (Bezet) 434 ms    Calculated P Axis 43 degrees    Calculated R Axis 30 degrees    Calculated T Axis 5 degrees    Diagnosis       Normal sinus rhythm  Nonspecific T wave abnormality  Abnormal ECG  When compared with ECG of 30-Dec-2015 13:20,  Vent. rate has decreased BY  75 BPM  Criteria for Inferior infarct are  no longer present  ST no longer depressed in Lateral leads  Inverted T waves have replaced nonspecific T wave abnormality in Inferior   leads  T wave inversion now evident in Anterior leads  Nonspecific T wave abnormality, improved in Lateral leads     CBC WITH AUTOMATED DIFF    Collection Time: 01/23/16 12:25 PM   Result Value Ref Range    WBC 5.9 3.6 - 11.0 K/uL    RBC 4.19 3.80 - 5.20 M/uL    HGB 11.8 11.5 - 16.0 g/dL    HCT 38.0 35.0 - 47.0 %    MCV 90.7 80.0 - 99.0 FL    MCH 28.2 26.0 - 34.0 PG    MCHC 31.1 30.0 - 36.5 g/dL    RDW 17.2 (H) 11.5 - 14.5 %    PLATELET 182 150 - 400 K/uL    NEUTROPHILS 76 (H) 32 - 75 %    LYMPHOCYTES 12 12 - 49 %    MONOCYTES 8 5 - 13 %    EOSINOPHILS 4 0 - 7 %    BASOPHILS 0 0 - 1 %    ABS. NEUTROPHILS 4.5 1.8 - 8.0 K/UL    ABS. LYMPHOCYTES 0.7 (L) 0.8 - 3.5 K/UL    ABS. MONOCYTES 0.5 0.0 - 1.0 K/UL    ABS. EOSINOPHILS 0.2 0.0 - 0.4 K/UL    ABS. BASOPHILS 0.0 0.0 - 0.1 K/UL    RBC COMMENTS ANISOCYTOSIS  1+        DF SMEAR SCANNED     METABOLIC PANEL, COMPREHENSIVE     Collection Time: 01/23/16 12:25 PM   Result Value Ref Range    Sodium 138 136 - 145 mmol/L    Potassium 3.3 (L) 3.5 - 5.1 mmol/L    Chloride 94 (L) 97 - 108 mmol/L    CO2 41 (HH) 21 - 32 mmol/L    Anion gap 3 (L) 5 - 15 mmol/L    Glucose 128 (H) 65 - 100 mg/dL    BUN 18 6 - 20 MG/DL    Creatinine 0.94 0.55 - 1.02 MG/DL    BUN/Creatinine ratio 19 12 - 20      GFR est AA >60 >60 ml/min/1.66m    GFR est non-AA 59 (L) >60 ml/min/1.749m   Calcium 8.2 (L) 8.5 - 10.1 MG/DL    Bilirubin, total 1.5 (H) 0.2 - 1.0 MG/DL    ALT (SGPT) 10 (L) 12 - 78 U/L    AST (SGOT) 10 (L) 15 - 37 U/L    Alk. phosphatase 70 45 - 117 U/L    Protein, total 7.5 6.4 - 8.2 g/dL    Albumin 3.4 (L) 3.5 - 5.0 g/dL    Globulin 4.1 (H) 2.0 - 4.0 g/dL    A-G Ratio 0.8 (L) 1.1 - 2.2     MAGNESIUM    Collection Time: 01/23/16 12:25 PM   Result Value Ref Range    Magnesium 1.7 1.6 - 2.4 mg/dL   NT-PRO BNP    Collection Time: 01/23/16 12:25 PM   Result Value Ref Range    NT pro-BNP 1923 (H) 0 - 125 PG/ML   TROPONIN I    Collection Time: 01/23/16 12:25 PM   Result Value Ref Range    Troponin-I, Qt. <0.04 <0.05 ng/mL   CK W/ REFLX CKMB    Collection Time: 01/23/16 12:25 PM   Result Value Ref Range    CK 56 26 - 192 U/L  BLOOD GAS, ARTERIAL    Collection Time: 01/23/16  1:35 PM   Result Value Ref Range    pH 7.42 7.35 - 7.45      PCO2 53 (H) 35.0 - 45.0 mmHg    PO2 53 (L) 80 - 100 mmHg    O2 SAT 88 (L) 92 - 97 %    BICARBONATE 34 (H) 22 - 26 mmol/L    BASE EXCESS 7.3 mmol/L    O2 METHOD NASAL O2      O2 FLOW RATE 4.00 L/min    Sample source ARTERIAL      SITE LEFT RADIAL      ALLEN'S TEST YES

## 2016-01-23 NOTE — ED Provider Notes (Signed)
HPI Comments: Brittney Shelton is a 65 y.o. female with PMHx of DM, HTN, CHF, and COPD presenting ambulatory to [facility] c/o intermittent SOB since she left the hospital at the beginning of the month, but worsening today. Pt notes additional symptoms of non productive cough, nausea, and chills. She reports that she was admitted to the hospital for pneumonia. Pt notes that she was at her eye doctor's office today when she starting getting SOB. She wears 4L chronically and presented to ED with O2 Sats in the 50s % on 4L O2. Pt was placed on venti mask after which O2 sats increased to 80-90%. Patient denies orthopnea, and her lower extremity edema is at baseline. Pt denies chest pain, abdominal pain, vomiting, diarrhea, wheezing, fever, dysuria, hematuria, or hemoptysis.      PCP: Ermalene Postin, MD    There are no other complaints, changes, or physical findings at this time.      The history is provided by the patient. No language interpreter was used.        Past Medical History:   Diagnosis Date   ??? Chronic obstructive pulmonary disease ([place])    ??? Congestive heart failure ([place])    ??? Diabetes ([place])    ??? Hypertension        History reviewed. No pertinent surgical history.      Family History:   Problem Relation Age of Onset   ??? Diabetes Mother    ??? Arthritis-osteo Father    ??? COPD Sister    ??? Diabetes Brother    ??? Diabetes Brother        Social History     Social History   ??? Marital status: SINGLE     Spouse name: N/A   ??? Number of children: N/A   ??? Years of education: N/A     Occupational History   ??? Not on file.     Social History Main Topics   ??? Smoking status: Never Smoker   ??? Smokeless tobacco: Never Used   ??? Alcohol use No   ??? Drug use: No   ??? Sexual activity: No     Other Topics Concern   ??? Not on file     Social History Narrative         ALLERGIES: Review of patient's allergies indicates no known allergies.    Review of Systems   Constitutional: Positive for chills. Negative for fatigue and fever.    HENT: Negative for congestion, rhinorrhea and sore throat.    Eyes: Negative for pain, discharge and visual disturbance.   Respiratory: Positive for cough and shortness of breath. Negative for chest tightness and wheezing.    Cardiovascular: Negative for chest pain, palpitations and leg swelling.   Gastrointestinal: Positive for nausea. Negative for abdominal pain, constipation, diarrhea and vomiting.   Genitourinary: Negative for dysuria, frequency and hematuria.   Musculoskeletal: Negative for arthralgias, back pain and myalgias.   Skin: Negative for rash.   Neurological: Negative for dizziness, weakness, light-headedness and headaches.   Psychiatric/Behavioral: Negative.        Patient Vitals for the past 12 hrs:   Temp Pulse Resp BP SpO2   01/23/16 1330 - 64 21 167/85 95 %   01/23/16 1300 - 64 20 182/79 96 %   01/23/16 1245 - 66 14 174/78 (!) 89 %   01/23/16 1215 - 65 27 151/75 100 %   01/23/16 1210 97.7 ??F (36.5 ??C) 66 10 159/79 100 %   01/23/16  1158 - 87 - - -            Physical Exam   Constitutional: She is oriented to person, place, and time. She appears well-developed and well-nourished. No distress.   Obese    HENT:   Head: Normocephalic and atraumatic.   Eyes: EOM are normal. Right eye exhibits no discharge. Left eye exhibits no discharge. No scleral icterus.   Neck: Normal range of motion. Neck supple. No tracheal deviation present.   Cardiovascular: Normal rate, regular rhythm, normal heart sounds and intact distal pulses.  Exam reveals no gallop and no friction rub.    No murmur heard.  Pulmonary/Chest: Breath sounds normal. Tachypnea noted. She is in respiratory distress (Mild). She has no wheezes. She has no rales.   On venturi mask   Abdominal: Soft. She exhibits no distension. There is no tenderness.   Musculoskeletal: Normal range of motion. She exhibits edema.   Trace bilateral lower extremity edema   Lymphadenopathy:     She has no cervical adenopathy.    Neurological: She is alert and oriented to person, place, and time.   No focal neuro deficits   Skin: Skin is warm and dry. No rash noted.   Psychiatric: She has a normal mood and affect.   Nursing note and vitals reviewed.       MDM  Number of Diagnoses or Management Options  Acute on chronic diastolic congestive heart failure ([place]):   Acute on chronic respiratory failure with hypoxia and hypercapnia [facility]):   Diagnosis management comments:     Differential includes heart failure, pulmonary edema, pleural effusion, bronchitis, pneumonia, COPD, ACS, PE         Amount and/or Complexity of Data Reviewed  Clinical lab tests: ordered and reviewed  Tests in the radiology section of CPT??: ordered and reviewed  Tests in the medicine section of CPT??: ordered and reviewed  Review and summarize past medical records: yes  Discuss the patient with other providers: yes (Hospitalist)  Independent visualization of images, tracings, or specimens: yes    Critical Care  Total time providing critical care: 30-74 minutes    ED Course       Procedures    EKG interpretation: (Preliminary)  12:11 PM  Rhythm: sinus rhythm; and regular . Rate (approx.): 65; Axis: normal; PR interval: normal; QRS interval: normal ; ST/T wave: T wave inversion of the inferior lateral leads.    CONSULT NOTE:   3:12 PM  Ocie Cornfield, MD spoke with Dr. Sissy Hoff,   Specialty: Hospitalist  Discussed pt's hx, disposition, and available diagnostic and imaging results. Reviewed care plans. Consultant will evaluate pt for admission. Dr. Marin Comment wants to diurese her and see if pt improves with treatment before she is admitted.  Written by Charlestine Massed, ED Scribe, as dictated by Ocie Cornfield, MD.    PROGRESS NOTE:  3:21 PM  Pt O2 decreased to 70s on 4L, pt placed back on Venturi mask. Dr. Marin Comment will admit pt.  Written by Charlestine Massed, ED Scribe, as dictated by Ocie Cornfield, MD.    CRITICAL CARE NOTE:    5:27 PM    IMPENDING DETERIORATION - Respiratory     ASSOCIATED RISK FACTORS - Hypoxia    MANAGEMENT- Bedside assessment, supervision of care    INTERPRETATION - EKG, CXR, vital signs    INTERVENTIONS - oxygen, Venturi mask, lasix    CASE REVIEW - hospitalist, nursing    TREATMENT RESPONSE -  improved    PERFORMED BY - self    NOTES  I have spent 60 minutes of critical care time involved in lab review, consultations with specialist, family decision- making, bedside attention and documentation. During this entire length of time I was immediately available to the patient .  Ocie Cornfield, MD    LABORATORY TESTS:  Recent Results (from the past 12 hour(s))   EKG, 12 LEAD, INITIAL    Collection Time: 01/23/16 12:10 PM   Result Value Ref Range    Ventricular Rate 65 BPM    Atrial Rate 65 BPM    P-R Interval 194 ms    QRS Duration 88 ms    Q-T Interval 418 ms    QTC Calculation (Bezet) 434 ms    Calculated P Axis 43 degrees    Calculated R Axis 30 degrees    Calculated T Axis 5 degrees    Diagnosis       Normal sinus rhythm  Nonspecific T wave abnormality  Abnormal ECG  When compared with ECG of 30-Dec-2015 13:20,  Vent. rate has decreased BY  75 BPM  Criteria for Inferior infarct are no longer present  ST no longer depressed in Lateral leads  Inverted T waves have replaced nonspecific T wave abnormality in Inferior   leads  T wave inversion now evident in Anterior leads  Nonspecific T wave abnormality, improved in Lateral leads     CBC WITH AUTOMATED DIFF    Collection Time: 01/23/16 12:25 PM   Result Value Ref Range    WBC 5.9 3.6 - 11.0 K/uL    RBC 4.19 3.80 - 5.20 M/uL    HGB 11.8 11.5 - 16.0 g/dL    HCT 38.0 35.0 - 47.0 %    MCV 90.7 80.0 - 99.0 FL    MCH 28.2 26.0 - 34.0 PG    MCHC 31.1 30.0 - 36.5 g/dL    RDW 17.2 (H) 11.5 - 14.5 %    PLATELET 182 150 - 400 K/uL    NEUTROPHILS 76 (H) 32 - 75 %    LYMPHOCYTES 12 12 - 49 %    MONOCYTES 8 5 - 13 %    EOSINOPHILS 4 0 - 7 %    BASOPHILS 0 0 - 1 %    ABS. NEUTROPHILS 4.5 1.8 - 8.0 K/UL     ABS. LYMPHOCYTES 0.7 (L) 0.8 - 3.5 K/UL    ABS. MONOCYTES 0.5 0.0 - 1.0 K/UL    ABS. EOSINOPHILS 0.2 0.0 - 0.4 K/UL    ABS. BASOPHILS 0.0 0.0 - 0.1 K/UL    RBC COMMENTS ANISOCYTOSIS  1+        DF SMEAR SCANNED     METABOLIC PANEL, COMPREHENSIVE    Collection Time: 01/23/16 12:25 PM   Result Value Ref Range    Sodium 138 136 - 145 mmol/L    Potassium 3.3 (L) 3.5 - 5.1 mmol/L    Chloride 94 (L) 97 - 108 mmol/L    CO2 41 (HH) 21 - 32 mmol/L    Anion gap 3 (L) 5 - 15 mmol/L    Glucose 128 (H) 65 - 100 mg/dL    BUN 18 6 - 20 MG/DL    Creatinine 0.94 0.55 - 1.02 MG/DL    BUN/Creatinine ratio 19 12 - 20      GFR est AA >60 >60 ml/min/1.27m    GFR est non-AA 59 (L) >60 ml/min/1.768m   Calcium 8.2 (L) 8.5 -  10.1 MG/DL    Bilirubin, total 1.5 (H) 0.2 - 1.0 MG/DL    ALT (SGPT) 10 (L) 12 - 78 U/L    AST (SGOT) 10 (L) 15 - 37 U/L    Alk. phosphatase 70 45 - 117 U/L    Protein, total 7.5 6.4 - 8.2 g/dL    Albumin 3.4 (L) 3.5 - 5.0 g/dL    Globulin 4.1 (H) 2.0 - 4.0 g/dL    A-G Ratio 0.8 (L) 1.1 - 2.2     MAGNESIUM    Collection Time: 01/23/16 12:25 PM   Result Value Ref Range    Magnesium 1.7 1.6 - 2.4 mg/dL   NT-PRO BNP    Collection Time: 01/23/16 12:25 PM   Result Value Ref Range    NT pro-BNP 1923 (H) 0 - 125 PG/ML   TROPONIN I    Collection Time: 01/23/16 12:25 PM   Result Value Ref Range    Troponin-I, Qt. <0.04 <0.05 ng/mL   CK W/ REFLX CKMB    Collection Time: 01/23/16 12:25 PM   Result Value Ref Range    CK 56 26 - 192 U/L   BLOOD GAS, ARTERIAL    Collection Time: 01/23/16  1:35 PM   Result Value Ref Range    pH 7.42 7.35 - 7.45      PCO2 53 (H) 35.0 - 45.0 mmHg    PO2 53 (L) 80 - 100 mmHg    O2 SAT 88 (L) 92 - 97 %    BICARBONATE 34 (H) 22 - 26 mmol/L    BASE EXCESS 7.3 mmol/L    O2 METHOD NASAL O2      O2 FLOW RATE 4.00 L/min    Sample source ARTERIAL      SITE LEFT RADIAL      ALLEN'S TEST YES         IMAGING RESULTS:  ?? EXAM:  XR CHEST PA LAT  ??  INDICATION:  Sudden onset shortness of breath today. COPD.  ??   COMPARISON: Chest views on 12/30/2015  ??  TECHNIQUE: Upright AP and lateral chest views  ??  FINDINGS: Cardiac monitoring wires overlie the thorax. Cardiomegaly is  unchanged. Aortic arch is not enlarged. Pulmonary vasculature is prominent.   ??  Pulmonary edema is mild. No focal pneumonia. Bones are unchanged.  ??  IMPRESSION  IMPRESSION:  ??  Mild pulmonary edema in the presumed setting of heart failure.       MEDICATIONS GIVEN:  Medications   furosemide (LASIX) injection 40 mg (not administered)       IMPRESSION:  1. Acute on chronic respiratory failure with hypoxia and hypercapnia (HCC)    2. Acute on chronic diastolic congestive heart failure ([place])        PLAN:  1. Admit to hospitalist    ADMIT NOTE:  3:12 PM  Patient is being admitted to the hospital by Dr. Sissy Hoff. The results of their tests and reasons for their admission have been discussed with the patient and/or available family. They convey agreement and understanding for the need to be admitted and for their admission diagnosis.          This note is prepared by Charlestine Massed, acting as Scribe for Ocie Cornfield, MD      Ocie Cornfield, MD: The scribe's documentation has been prepared under my direction and personally reviewed by me in its entirety. I confirm that the note above accurately reflects all work, treatment, procedures,  and medical decision making performed by me.

## 2016-01-23 NOTE — ED Notes (Signed)
Patient assisted to bedside commode at this time. Patient becomes very short of breath with movement. Patient voided and assisted back to bed in a comfortable position, call bell within reach.

## 2016-01-23 NOTE — ED Notes (Signed)
Shift report received from Lauren, RN at this time. Assumed care of patient at this time, patient in a position of comfort, call bell within reach.

## 2016-01-23 NOTE — ED Notes (Signed)
Dr. Ashwell at bedside to evaluate patient.

## 2016-01-23 NOTE — Progress Notes (Signed)
Bedside shift change report given to Wilkie AyeKristy, Charity fundraiserN (oncoming nurse) by Waldo LaineKaitlin ,RN (offgoing nurse). Report included the following information SBAR.

## 2016-01-23 NOTE — ED Notes (Signed)
Pt. sats dropped to 78% on 4L at rest.  Pt. Placed back on 50% venti mask at this time. Spoke with Dr. Bryan LemmaAshwell.

## 2016-01-23 NOTE — ED Notes (Signed)
TRANSFER - OUT REPORT:    Verbal report given to Kaitilin, RN (name) on Brittney Shelton  [facility] of care       Report consisted of patient???s Situation, Background, Assessment and   Recommendations(SBAR).     Information from the following report(s) SBAR, Kardex, ED Summary, Intake/Output, MAR, Accordion, Recent Results and Cardiac Rhythm NSR was reviewed with the receiving nurse.    Lines:   Peripheral IV 01/23/16 Left Antecubital (Active)   Site Assessment Clean, dry, & intact 01/23/2016 12:28 PM   Phlebitis Assessment 0 01/23/2016 12:28 PM   Infiltration Assessment 0 01/23/2016 12:28 PM   Dressing Status Clean, dry, & intact 01/23/2016 12:28 PM   Dressing Type Tape;Transparent 01/23/2016 12:28 PM   Hub Color/Line Status Pink 01/23/2016 12:28 PM        Opportunity for questions and clarification was provided.      Patient transported with:   O2 @ 4 liters  Tech

## 2016-01-23 NOTE — ED Notes (Signed)
Spoke with Dr. Bryan LemmaAshwell.  Verbal orders for labs received at this time.

## 2016-01-24 ENCOUNTER — Encounter: Primary: Internal Medicine

## 2016-01-24 LAB — METABOLIC PANEL, BASIC
Anion gap: 5 mmol/L (ref 5–15)
BUN/Creatinine ratio: 22 — ABNORMAL HIGH (ref 12–20)
BUN: 23 MG/DL — ABNORMAL HIGH (ref 6–20)
CO2: 36 mmol/L — ABNORMAL HIGH (ref 21–32)
Calcium: 8.1 MG/DL — ABNORMAL LOW (ref 8.5–10.1)
Chloride: 94 mmol/L — ABNORMAL LOW (ref 97–108)
Creatinine: 1.04 MG/DL — ABNORMAL HIGH (ref 0.55–1.02)
GFR est AA: >60 mL/min/{1.73_m2} (ref >60)
GFR est non-AA: 53 mL/min/{1.73_m2} — ABNORMAL LOW (ref >60)
Glucose: 170 mg/dL — ABNORMAL HIGH (ref 65–100)
Potassium: 4 mmol/L (ref 3.5–5.1)
Sodium: 135 mmol/L — ABNORMAL LOW (ref 136–145)

## 2016-01-24 LAB — CBC WITH AUTOMATED DIFF
ABS. BASOPHILS: 0 10*3/uL (ref 0.0–0.1)
ABS. EOSINOPHILS: 0 10*3/uL (ref 0.0–0.4)
ABS. LYMPHOCYTES: 0.4 10*3/uL — ABNORMAL LOW (ref 0.8–3.5)
ABS. MONOCYTES: 0.1 10*3/uL (ref 0.0–1.0)
ABS. NEUTROPHILS: 6.8 10*3/uL (ref 1.8–8.0)
BASOPHILS: 0 % (ref 0–1)
EOSINOPHILS: 0 % (ref 0–7)
HCT: 35.4 % (ref 35.0–47.0)
HGB: 11.5 g/dL (ref 11.5–16.0)
LYMPHOCYTES: 6 % — ABNORMAL LOW (ref 12–49)
MCH: 29 PG (ref 26.0–34.0)
MCHC: 32.5 g/dL (ref 30.0–36.5)
MCV: 89.4 FL (ref 80.0–99.0)
MONOCYTES: 1 % — ABNORMAL LOW (ref 5–13)
NEUTROPHILS: 93 % — ABNORMAL HIGH (ref 32–75)
PLATELET: 178 10*3/uL (ref 150–400)
RBC: 3.96 M/uL (ref 3.80–5.20)
RDW: 17.1 % — ABNORMAL HIGH (ref 11.5–14.5)
WBC: 7.2 10*3/uL (ref 3.6–11.0)

## 2016-01-24 LAB — EKG, 12 LEAD, INITIAL
Atrial Rate: 65 {beats}/min
Calculated P Axis: 40 degrees
Calculated R Axis: 27 degrees
Calculated T Axis: -4 degrees
Diagnosis: NORMAL
P-R Interval: 192 ms
Q-T Interval: 398 ms
QRS Duration: 88 ms
QTC Calculation (Bezet): 413 ms
Ventricular Rate: 65 {beats}/min

## 2016-01-24 LAB — GLUCOSE, POC
Glucose (POC): 163 mg/dL — ABNORMAL HIGH (ref 65–100)
Glucose (POC): 135 mg/dL — ABNORMAL HIGH (ref 65–100)
Glucose (POC): 159 mg/dL — ABNORMAL HIGH (ref 65–100)
Glucose (POC): 195 mg/dL — ABNORMAL HIGH (ref 65–100)

## 2016-01-24 LAB — MAGNESIUM: Magnesium: 1.8 mg/dL (ref 1.6–2.4)

## 2016-01-24 MED ORDER — FUROSEMIDE 10 MG/ML IJ SOLN
10 mg/mL | Freq: Two times a day (BID) | INTRAMUSCULAR | Status: DC
Start: 2016-01-24 — End: 2016-01-24
  Administered 2016-01-24: 21:00:00 via INTRAVENOUS

## 2016-01-24 MED ORDER — HYDRALAZINE 20 MG/ML IJ SOLN
20 mg/mL | Freq: Four times a day (QID) | INTRAMUSCULAR | Status: DC | PRN
Start: 2016-01-24 — End: 2016-01-27

## 2016-01-24 MED ORDER — PREDNISONE 20 MG TAB
20 mg | Freq: Every day | ORAL | Status: DC
Start: 2016-01-24 — End: 2016-01-25
  Administered 2016-01-25: 12:00:00 via ORAL

## 2016-01-24 MED ORDER — FUROSEMIDE 10 MG/ML IJ SOLN
10 mg/mL | Freq: Once | INTRAMUSCULAR | Status: AC
Start: 2016-01-24 — End: 2016-01-24
  Administered 2016-01-24: 22:00:00 via INTRAVENOUS

## 2016-01-24 MED ORDER — FUROSEMIDE 10 MG/ML IJ SOLN
10 mg/mL | Freq: Two times a day (BID) | INTRAMUSCULAR | Status: DC
Start: 2016-01-24 — End: 2016-01-25
  Administered 2016-01-25: 14:00:00 via INTRAVENOUS

## 2016-01-24 MED ORDER — ACETAMINOPHEN 325 MG TABLET
325 mg | ORAL | Status: DC | PRN
Start: 2016-01-24 — End: 2016-01-27
  Administered 2016-01-26: 01:00:00 via ORAL

## 2016-01-24 MED ORDER — IPRATROPIUM-ALBUTEROL 2.5 MG-0.5 MG/3 ML NEB SOLUTION
2.5 mg-0.5 mg/3 ml | Freq: Four times a day (QID) | RESPIRATORY_TRACT | Status: DC | PRN
Start: 2016-01-24 — End: 2016-01-27

## 2016-01-24 MED ORDER — METFORMIN 500 MG TAB
500 mg | Freq: Two times a day (BID) | ORAL | Status: DC
Start: 2016-01-24 — End: 2016-01-27
  Administered 2016-01-24 – 2016-01-27 (×2): via ORAL

## 2016-01-24 MED ORDER — ONDANSETRON (PF) 4 MG/2 ML INJECTION
4 mg/2 mL | Freq: Four times a day (QID) | INTRAMUSCULAR | Status: DC | PRN
Start: 2016-01-24 — End: 2016-01-27

## 2016-01-24 MED ORDER — UMECLIDINIUM 62.5 MCG/ACTUATION BLISTER POWDER FOR INHALATION
62.5 mcg/actuation | Freq: Every day | RESPIRATORY_TRACT | Status: DC
Start: 2016-01-24 — End: 2016-01-27
  Administered 2016-01-25 – 2016-01-27 (×3): via RESPIRATORY_TRACT

## 2016-01-24 MED FILL — LISINOPRIL 20 MG TAB: 20 mg | ORAL | Qty: 2

## 2016-01-24 MED FILL — ISOSORBIDE DINITRATE 10 MG TAB: 10 mg | ORAL | Qty: 1

## 2016-01-24 MED FILL — BD POSIFLUSH NORMAL SALINE 0.9 % INJECTION SYRINGE: INTRAMUSCULAR | Qty: 20

## 2016-01-24 MED FILL — ISOSORBIDE DINITRATE 20 MG TAB: 20 mg | ORAL | Qty: 2

## 2016-01-24 MED FILL — INSULIN LISPRO 100 UNIT/ML INJECTION: 100 unit/mL | SUBCUTANEOUS | Qty: 1

## 2016-01-24 MED FILL — GLIMEPIRIDE 4 MG TAB: 4 mg | ORAL | Qty: 1

## 2016-01-24 MED FILL — IPRATROPIUM-ALBUTEROL 2.5 MG-0.5 MG/3 ML NEB SOLUTION: 2.5 mg-0.5 mg/3 ml | RESPIRATORY_TRACT | Qty: 3

## 2016-01-24 MED FILL — FUROSEMIDE 10 MG/ML IJ SOLN: 10 mg/mL | INTRAMUSCULAR | Qty: 2

## 2016-01-24 MED FILL — BD POSIFLUSH NORMAL SALINE 0.9 % INJECTION SYRINGE: INTRAMUSCULAR | Qty: 10

## 2016-01-24 MED FILL — LOVENOX 40 MG/0.4 ML SUBCUTANEOUS SYRINGE: 40 mg/0.4 mL | SUBCUTANEOUS | Qty: 0.4

## 2016-01-24 MED FILL — FUROSEMIDE 10 MG/ML IJ SOLN: 10 mg/mL | INTRAMUSCULAR | Qty: 4

## 2016-01-24 MED FILL — CLONIDINE 0.1 MG TAB: 0.1 mg | ORAL | Qty: 1

## 2016-01-24 MED FILL — TOPROL XL 50 MG TABLET,EXTENDED RELEASE: 50 mg | ORAL | Qty: 4

## 2016-01-24 MED FILL — MUCINEX 600 MG TABLET, EXTENDED RELEASE: 600 mg | ORAL | Qty: 1

## 2016-01-24 MED FILL — PREDNISONE 20 MG TAB: 20 mg | ORAL | Qty: 3

## 2016-01-24 MED FILL — POTASSIUM CHLORIDE SR 10 MEQ TAB: 10 mEq | ORAL | Qty: 1

## 2016-01-24 MED FILL — PRAVASTATIN 10 MG TAB: 10 mg | ORAL | Qty: 2

## 2016-01-24 MED FILL — AMLODIPINE 5 MG TAB: 5 mg | ORAL | Qty: 2

## 2016-01-24 MED FILL — INCRUSE ELLIPTA 62.5 MCG/ACTUATION POWDER FOR INHALATION: 62.5 mcg/actuation | RESPIRATORY_TRACT | Qty: 7

## 2016-01-24 MED FILL — METFORMIN 500 MG TAB: 500 mg | ORAL | Qty: 1

## 2016-01-24 MED FILL — VITAMIN D3 25 MCG (1,000 UNIT) TABLET: 25 mcg (1,000 unit) | ORAL | Qty: 5

## 2016-01-24 MED FILL — CHILDREN'S ASPIRIN 81 MG CHEWABLE TABLET: 81 mg | ORAL | Qty: 1

## 2016-01-24 NOTE — Consults (Signed)
[address], [place], [place] [id number]  [phone]      CARDIOLOGY Si RaiderONSULT - [facility]     Date of  Admission: 01/23/2016 11:59 AM     Admission type:Emergency    Consult VHQ:[id number]:readmission  Consult by: hospitalist      Subjective:     Brittney Shelton is a 65 y.o. female with PMH COPD on home O2, HF, HTN, DM who was admitted for Acute on chronic respiratory failure with hypoxia and hypercapnia (HCC)  Acute on chronic diastolic (congestive) heart failure (HCC).    Brittney Shelton states that she was running errands to doctor's appointments yesterday when she became very SOB and her eye doctor suggested going to either her PCP or the ED.  Brittney Shelton states that she believes her oxygen at home hasn't been working correctly.  She denies complaints other than SOB and DOE.  She is on 4L O2 at home.  No chest pain, palpitations, orthopnea, leg swelling, dizziness/lightheadness, n/v.  Brittney Shelton states a lot of improvement in her symptoms since being admitted.     Brittney Shelton follows with Dr. Orvil FeilKaminsky for cardiology. Last ECHO 04/17 with EF 60-65%; NWMA; wall thickness mildly increased; mild AS; mild pulm HTN.  Stress test 04/17 without indication for ischemia.  No caths in our system.       Cardiac risk factors: obesity, sedentary life style, post-menopausal.      Patient Active Problem List    Diagnosis Date Noted   ??? Acute on chronic respiratory failure with hypoxia and hypercapnia (HCC) 01/23/2016   ??? Acute on chronic diastolic (congestive) heart failure (HCC) 01/23/2016   ??? Obesity 07/27/2015   ??? Requires continuous at home supplemental oxygen 07/27/2015   ??? Dependence on supplemental oxygen 07/27/2015   ??? ACP (advance care planning) 07/27/2015   ??? Counseling regarding advanced care planning and goals of care 07/27/2015   ??? Dyspnea 07/27/2015   ??? COPD (chronic obstructive pulmonary disease) (HCC) 07/26/2015   ??? Hypertension, uncontrolled 09/29/2014    ??? Well controlled type 2 diabetes mellitus (HCC) 09/29/2014   ??? Hypercholesterolemia 09/29/2014   ??? 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 Chronic acquired lymphedema 09/29/2014      Erenest RasherNau D Domah, MD  Past Medical History:   Diagnosis Date   ??? Chronic obstructive pulmonary disease (HCC)    ??? Congestive heart failure (HCC)    ??? Diabetes (HCC)    ??? Hypertension       Social History     Social History   ??? Marital status: SINGLE     Spouse name: N/A   ??? Number of children: N/A   ??? Years of education: N/A     Social History Main Topics   ??? Smoking status: Never Smoker   ??? Smokeless tobacco: Never Used   ??? Alcohol use No   ??? Drug use: No   ??? Sexual activity: No     Other Topics Concern   ??? None     Social History Narrative     No Known Allergies   Family History   Problem Relation Age of Onset   ??? Diabetes Mother    ??? Arthritis-osteo Father    ??? COPD Sister    ??? Diabetes Brother    ??? Diabetes Brother       Current Facility-Administered Medications   Medication Dose Route Frequency   ??? [START ON 01/25/2016] predniSONE (DELTASONE) tablet 40 mg  40 mg Oral  DAILY WITH BREAKFAST   ??? metFORMIN (GLUCOPHAGE) tablet 500 mg  500 mg Oral BID WITH MEALS   ??? albuterol-ipratropium (DUO-NEB) 2.5 MG-0.5 MG/3 ML  3 mL Nebulization Q6H PRN   ??? [START ON 01/25/2016] umeclidinium (INCRUSE ELLIPTA) 62.5 mcg/actuation  1 Puff Inhalation DAILY   ??? acetaminophen (TYLENOL) tablet 650 mg  650 mg Oral Q4H PRN   ??? ondansetron (ZOFRAN) injection 4 mg  4 mg IntraVENous Q6H PRN   ??? hydrALAZINE (APRESOLINE) 20 mg/mL injection 10 mg  10 mg IntraVENous Q6H PRN   ??? furosemide (LASIX) injection 40 mg  40 mg IntraVENous ACB&D   ??? amLODIPine (NORVASC) tablet 10 mg  10 mg Oral DAILY   ??? aspirin chewable tablet 81 mg  81 mg Oral DAILY   ??? cholecalciferol (VITAMIN D3) tablet 5,000 Units  5,000 Units Oral DAILY   ??? cloNIDine HCl (CATAPRES) tablet 0.1 mg  0.1 mg Oral DAILY   ??? glimepiride (AMARYL) tablet 4 mg  4 mg Oral ACB    ??? isosorbide dinitrate (ISORDIL) tablet 30 mg  30 mg Oral TID   ??? lisinopril (PRINIVIL, ZESTRIL) tablet 40 mg  40 mg Oral DAILY   ??? metoprolol succinate (TOPROL-XL) XL tablet 200 mg  200 mg Oral DAILY   ??? pravastatin (PRAVACHOL) tablet 20 mg  20 mg Oral QHS   ??? potassium chloride SR (KLOR-CON 10) tablet 10 mEq  10 mEq Oral BID   ??? guaiFENesin ER (MUCINEX) tablet 600 mg  600 mg Oral Q12H   ??? insulin lispro (HUMALOG) injection   SubCUTAneous AC&HS   ??? glucose chewable tablet 16 g  4 Tab Oral PRN   ??? glucagon (GLUCAGEN) injection 1 mg  1 mg IntraMUSCular PRN   ??? sodium chloride (NS) flush 5-10 mL  5-10 mL IntraVENous Q8H   ??? sodium chloride (NS) flush 5-10 mL  5-10 mL IntraVENous PRN   ??? enoxaparin (LOVENOX) injection 40 mg  40 mg SubCUTAneous Q12H   ??? dextrose 10% infusion 125-250 mL  125-250 mL IntraVENous PRN        Review of Symptoms:   Constitutional: negative  Eyes: negative   Ears, nose, mouth, throat, and face: negative  Respiratory: SOB/DOE  Cardiovascular: negative   Gastrointestinal: negative  Genitourinary:negative   Musculoskeletal:negative   Neurological: negative   Endocrine: negative          Objective:      Visit Vitals   ??? BP 120/41   ??? Pulse 69   ??? Temp 97.4 ??F (36.3 ??C)   ??? Resp 22   ??? Ht 5\' 6"  (1.676 m)   ??? Wt 147.8 kg (325 lb 12.8 oz)   ??? SpO2 94%   ??? BMI 52.59 kg/m2       Physical:   General: pleasant, morbidly obese, AAF sitting on side of bed in NAD  Heart: RRR, no m/S3/JVD  Lungs: clear   Abdomen: obese, Soft, +BS, NTND   Extremities: lymphedema vs obesity obscuring ability to assess LE edema.  Neurologic: Grossly normal    Data Review:   Recent Labs      01/24/16   0308  01/23/16   1225   WBC  7.2  5.9   HGB  11.5  11.8   HCT  35.4  38.0   PLT  178  182     Recent Labs      01/24/16   0416  01/23/16   1225   NA  135*  138  K  4.0  3.3*   CL  94*  94*   CO2  36*  41*   GLU  170*  128*   BUN  23*  18   CREA  1.04*  0.94   CA  8.1*  8.2*   MG  1.8  1.7   ALB   --   3.4*   TBILI   --   1.5*    SGOT   --   10*   ALT   --   10*       Recent Labs      01/23/16   1225   TROIQ  <0.04         Intake/Output Summary (Last 24 hours) at 01/24/16 1652  Last data filed at 01/24/16 1219   Gross per 24 hour   Intake              250 ml   Output             2050 ml   Net            -1800 ml        Cardiographics    Telemetry: SR  ECG: NSR  Echocardiogram: last as above   CXRAY: mild pulm edema        Assessment:       Active Problems:    Acute on chronic respiratory failure with hypoxia and hypercapnia (HCC) (01/23/2016)      Acute on chronic diastolic (congestive) heart failure (HCC) (01/23/2016)         Plan:       Brittney MourningMary Shelton is a 65 y.o. female who presented for SOB/DOE and was admitted for COPD and HF exacerbation. Mild edema on CXR.  Pro-BNP 1923.  Trop neg. K 3.3.    ?? Patient currently scheduled for same amount of diuretic she takes at home IV - will increase to help with diuresis and continue potassium supplementation.   ?? Continue ASA, BB, ACEi  ?? Will discontinue the once daily clonidine.  If she becomes hypertensive would recommend either BID/TID dosing or a clonidine patch.    ?? COPD per hospitalist/pulmonary.    ?? Patient states she's feeling very well and near her baseline.  May be ready for discharge home tomorrow.  Since Brittney Shelton is a bundle patient, she will need 3-5 day follow-up with Dr. Orvil FeilKaminsky after discharge.       [facility]      Milderd Meagerosanna Courtnie Brenes, NP  DNP, RN, AGACNP-BC

## 2016-01-24 NOTE — Progress Notes (Addendum)
1920 Received report from [place], Charity fundraiser. Assumed patient care.          Bedside shift change report given to Evette Doffing, RN (oncoming nurse) by Elmarie Shiley, RN (offgoing nurse). Report included the following information SBAR, Kardex, Intake/Output, MAR, Recent Results and Cardiac Rhythm NSR.

## 2016-01-24 NOTE — Other (Addendum)
C/p Rehab note- chart reviewed. Patient is on CHF bundle list      Adm with Acute on chronic hypoxic & hypercapneic respiratory failure POA -   Likely due to Mild acute COPD exacerbation POA??  Acute on Chronic diastolic dysfunction HF EF 60% POA.    HX includes HTN,T2DM,Hypercholesterolemia,COPD,Home oxygen, CHF. Nonsmoker.    LVEF from 09/25/2015 60-65%new to my practice Echo pending and Cardiology consulted.    Last teaching for CHF was Feb 2017 and pt received CHF folder at that time.    DIET DIABETIC CONSISTENT CARB Regular; 2 GM NA (House Low NA); AHA-LOW-CHOL FAT       Met with pt sitting on side of bed.       This was a follow-up visit to answer questions and reinforce prior teaching re: CHF, S&Ss, medication management, Low NA diet, daily weights, when to call the doctor and balancing rest/activity.      Teachback- said to call MD for wt gain of 6 pounds- corrected her and reminded to call for 2-3 pound increase in wt daily or 5 pound wt gain in one week.    States she lives with her brother and he helps her if needed with groceries.Reminded to not accept food brought in from family and to notify staff if tray has food that tastes too salty.      No new questions for me. Understanding verbalized.

## 2016-01-24 NOTE — Progress Notes (Signed)
Visited by volunteer Spiritual Care Partner 01/24/16.    Geri Jones, MPS, BCC, Lead Chaplain  [facility]    Chaplain Paging Service  [phone]

## 2016-01-24 NOTE — Other (Signed)
Called consult to cardiology. Awaiting call back.  Pulmonary consult already assigned with Dr. Jayme Cloud.

## 2016-01-24 NOTE — Progress Notes (Signed)
[id number] contacted inpatient case management at [phone] regarding patient. This writer left message requesting pulmonary consult and cardio consult for patient. Patient pulm is Dr. Patsey Berthold. Patient is currently scheduled to follow up 01/25/16. This Probation officer contact information left on voicemail.    0847 Attempted to contact [place] office. Unable to reach due to excessive hold time. Will try at later time.     0859 met with patient resting comfortable in bed with breakfast. Patient reports she  thinks something is wrong with concentrator and she contacted [facility] yesterday and someone is coming today, 01/24/16 to check it. Patient calls [facility] when she has 2 full portable tanks orders 7 more and they deliver the next day. Patient will have brother bring full portable oxygen tank at discharge. Feels better spoke about diet, reading food labels,  Patient requested this writer to cancel pulm appt for 01/25/16 so she is not charged a fee. Writer sent message to inpatient case manager.     4680 spoke Jonelle Sidle at [facility] regarding appointment. Jonelle Sidle advised patient is currently admitted to [facility].  This Probation officer has moved from 01/25/16 to 01/29/16 @ 1045.    1021 message received from inpatient CM informing this writer she will request consults for both cardio and pulm. TOC appointment scheduled for 01/30/16 at 1215.     1139 will work with cardiology NN to get follow up appointment ASAP after discharge.

## 2016-01-24 NOTE — Progress Notes (Signed)
Heart Failure Care Coordinator visited the patient in room.??Provided introduction to self, and explanation of the Care Coordinator role.  Patient was provided a copy of the [facility] letter. Patient made aware of contact information should any questions arise before or after discharge.     Patient stated they have functioning scales. Reinforced the importance of checking weight every AM and calling MD if weight is up 3 lbs in a day or 5 lbs in a week (or per MD guidelines).     Discussed with patient the importance of developing a plan in regards to CHF signs/symptoms. "who would you call when you notice obvious signs and symptoms of heart failure such as weight gain, lower ext. Edema and SOB"? Pt will contact Dr Marval Regal for and CHF S/S.

## 2016-01-24 NOTE — Other (Signed)
Bedside and Verbal shift change report given to Tiffany, Charity fundraiser (Cabin crew) by Misty Stanley, RN (offgoing nurse). Report included the following information SBAR, Kardex, Intake/Output, MAR, Recent Results and Med Rec Status.

## 2016-01-24 NOTE — Progress Notes (Signed)
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 There were no regional wall motion abnormalities. Wall thickness was mildly increased.  - CXR with mild pulmonary edema in the presumed setting of heart failure  - con't IV lasix (on po at home).  Fluid restriction added to diet order.  - strict I/Os, daily weights  - con't prednisone, dose decreased today as wheezing improved  - d/c Abx, do not feel this is infectious.  Con't mucinex.  - change nebs to prn.  Start incruse.  - pulmonology and cardiology consulted due to readmission status  Hypertension, benign/essential:  - con't norvasc, clonidine, isordil, metoprolol, lisinopril  - con't lasix as above  - prn hydralazine  Non insulin dependent DM2 controlled:  - con't home amaryl  - restart glucophage  - lispro sliding scale   Hyperlipidemia: con't pravachol   Lymphedema in supermorbid obesity (BMI 50.68)????  ????  Code Status: DNR  Surrogate Decision Maker: brother Molinda Bailiffrnest Smith??  DVT Prophylaxis:??lovenox      Subjective:     Chief Complaint / Reason for Physician Visit  "I feel a little better today".  Feels her edema is at baseline.  Discussed with RN events overnight.     Review of Systems:  Symptom Y/N Comments  Symptom Y/N Comments   Fever/Chills n   Chest Pain n    Poor Appetite n   Edema y    Cough n minimal  Abdominal Pain n    Sputum n   Joint Pain     SOB/DOE y   Pruritis/Rash     Nausea/vomit    Tolerating PT/OT     Diarrhea    Tolerating Diet     Constipation    Other       Could NOT obtain due to:      Objective:     VITALS:   Last 24hrs VS reviewed since prior progress note. Most recent are:  Patient Vitals for the past 24 hrs:   Temp Pulse Resp BP SpO2    01/24/16 1219 98.2 ??F (36.8 ??C) 70 24 146/74 100 %   01/24/16 0808 - - - - 92 %   01/24/16 0756 97.7 ??F (36.5 ??C) 73 24 125/60 (!) 83 %   01/24/16 0208 98.1 ??F (36.7 ??C) 73 22 112/56 92 %   01/24/16 0149 - - - - 92 %   01/23/16 2305 98.1 ??F (36.7 ??C) 60 20 146/74 90 %   01/23/16 1952 97.6 ??F (36.4 ??C) 72 - 120/90 95 %   01/23/16 1941 - - - - 95 %   01/23/16 1842 - - - - 91 %   01/23/16 1755 97.5 ??F (36.4 ??C) 66 24 154/84 (!) 84 %   01/23/16 1656 - 65 18 - -   01/23/16 1652 - 75 24 - 94 %   01/23/16 1645 - 63 17 153/77 (!) 87 %   01/23/16 1630 - 64 17 159/74 (!) 82 %   01/23/16 1615 - 64 22 175/86 97 %   01/23/16 1600 - 61 - 139/62 98 %   01/23/16 1545 - - - 150/65 -   01/23/16 1530 - 65 19 153/69 98 %  01/23/16 1515 - 65 15 164/80 (!) 79 %   01/23/16 1512 - 66 18 143/63 (!) 80 %   01/23/16 1330 - 64 21 167/85 95 %       Intake/Output Summary (Last 24 hours) at 01/24/16 1308  Last data filed at 01/24/16 1219   Gross per 24 hour   Intake              250 ml   Output             2050 ml   Net            -1800 ml        PHYSICAL EXAM:  General: Obese. Alert, cooperative, no acute distress????  EENT:  EOMI. Anicteric sclerae. MMM  Resp:  Minimal scattered wheeze.  No accessory muscle use.  Fair air movement.  CV:  Regular  rhythm,?? 2+ edema (lymphedema)  GI:  Soft, moderately distended, Non tender. ??+Bowel sounds  Neurologic:?? Alert and oriented X 3, normal speech,   Psych:???? Some insight.??Not anxious nor agitated  Skin:  No rashes.  No jaundice    Reviewed most current lab test results and cultures  YES  Reviewed most current radiology test results   YES  Review and summation of old records today    NO  Reviewed patient's current orders and MAR    YES  PMH/SH reviewed - no change compared to H&P  ________________________________________________________________________  Care Plan discussed with:    Comments   Patient x    Family      RN x    Care Manager x    Consultant  x pulmonology                     x Multidiciplinary team rounds were held today with case manager, nursing, pharmacist and clinical coordinator.  Patient's plan of care was discussed; medications were reviewed and discharge planning was addressed.     ________________________________________________________________________  Total NON critical care TIME:  35 Minutes    Total CRITICAL CARE TIME Spent:   Minutes non procedure based      Comments   >50% of visit spent in counseling and coordination of care x    ________________________________________________________________________  Fredirick Lathe, MD     Procedures: see electronic medical records for all procedures/Xrays and details which were not copied into this note but were reviewed prior to creation of Plan.      LABS:  I reviewed today's most current labs and imaging studies.  Pertinent labs include:  Recent Labs      01/24/16   0308  01/23/16   1225   WBC  7.2  5.9   HGB  11.5  11.8   HCT  35.4  38.0   PLT  178  182     Recent Labs      01/24/16   0416  01/23/16   1225   NA  135*  138   K  4.0  3.3*   CL  94*  94*   CO2  36*  41*   GLU  170*  128*   BUN  23*  18   CREA  1.04*  0.94   CA  8.1*  8.2*   MG  1.8  1.7   ALB   --   3.4*   TBILI   --   1.5*   SGOT   --   10*   ALT   --  10*       Signed: Fredirick LatheAnn M Shannie Kontos, MD

## 2016-01-24 NOTE — Progress Notes (Signed)
Readmit within 30 days    This is a 65 yr old female who has been admitted through the ER after becoming SOB. Patient found to have mild pulm edema on cxr in ED and has been admitted due to acute on chronic respiratory failure and COPD exacerbation. Patient was scheduled to see Dr. Jayme Cloud, Pulmonologist, tomorrow and the nurse navigator with PCP office is notifying Dr. Jayme Cloud of patient's admission. Patient was recently discharged 8/3 from the hospital following treatment for COPD, PNA and respiratory failure. Patient is open to [facility].     During ID rounds Dr. Excell Seltzer has consulted cardiologist and pulmonologist.    PMH:  Chronic obstructive pulmonary disease (HCC) ??   ??? Congestive heart failure (HCC) ??  ??? Diabetes (HCC) ??  ??? Hypertension             Care Management Interventions  PCP Verified by CM: Yes  Transition of Care Consult (CM Consult):  (Is open to [facility])  MyChart Signup: No  Discharge Durable Medical Equipment: No (is on 4L of oxygen at home through [facility])  Physical Therapy Consult: No  Occupational Therapy Consult: No  Speech Therapy Consult: No  Current Support Network: Own Home (Lives with her brother)  Plan discussed with Pt/Family/Caregiver: Yes  Freedom of Choice Offered: Yes

## 2016-01-24 NOTE — Other (Signed)
Attempted to get IV in. Current IV in LAC flushes with good blood return, but is tempramental with fluids running. Called PICC team for IV attempt today.

## 2016-01-24 NOTE — Consults (Signed)
[facility]   Pulmonary, Critical Care, and Sleep Medicine    Initial Patient Consult    Name: Brittney Shelton MRN: [id number]   DOB: [birth year] Hospital: [facility]   Date: 01/24/2016        IMPRESSION:   ?? Acute on chronic respiratory failure  ?? CHF  ?? Pulmonary edema  ?? COPD exacerbation      RECOMMENDATIONS:   ?? O2  ?? Diuretics  ?? Jet nebs  ?? Taper oral steroids   ?? Empiric abx  ?? Pt has an outpt appt to see me 8/29  ?? Home in am??     Subjective:     This patient has been seen and evaluated at the request of Nurse Navigator??? for COPD. Patient is a 65 y.o. female obese on home O2 presented with sob  Found to have high BNP and CXR c/w pulmonary edema  Admitted by hospitalist and was being more than adequately managed by that team  Kathlee Nations (Nurse Navigator) requested a pulmonary and a cardiology consults????  Pt today very close to her baseline  In no distress      Past Medical History:   Diagnosis Date   ??? Chronic obstructive pulmonary disease (HCC)    ??? Congestive heart failure (HCC)    ??? Diabetes (HCC)    ??? Hypertension       History reviewed. No pertinent surgical history.   Prior to Admission medications    Medication Sig Start Date End Date Taking? Authorizing Provider   glimepiride (AMARYL) 4 mg tablet Take 4 mg by mouth daily.   Yes Historical Provider   metFORMIN (GLUCOPHAGE) 500 mg tablet Take 500 mg by mouth nightly. Patient takes 500 mg QAM PRN if blood sugar >150, and 500 mg QHS   Yes Historical Provider   metFORMIN (GLUCOPHAGE) 500 mg tablet Take 500 mg by mouth daily as needed (if blood sugar > 150). Patient takes 500 mg QAM PRN if blood sugar >150, and 500 mg QHS   Yes Historical Provider   naproxen sodium (ALEVE) 220 mg tablet Take 440 mg by mouth every twelve (12) hours as needed for Pain.   Yes Historical Provider   furosemide (LASIX) 40 mg tablet TAKE 1 TABLET BY MOUTH TWICE DAILY FOR SWELLING 12/17/15  Yes Nau D Domah, MD    cholecalciferol, VITAMIN D3, (VITAMIN D3) 5,000 unit tab tablet Take 1 Tab by mouth daily. 12/11/15  Yes Erenest Rasher, MD   pravastatin (PRAVACHOL) 20 mg tablet TAKE 1 TABLET BY MOUTH ONCE DAILY AT NIGHT 11/26/15  Yes Nau D Domah, MD   amLODIPine (NORVASC) 10 mg tablet TAKE 1 TABLET BY MOUTH DAILY 08/29/15  Yes Nau D Domah, MD   albuterol-ipratropium (DUO-NEB) 2.5 mg-0.5 mg/3 ml nebu Take 3 mL by inhalation three (3) times daily.   Yes Historical Provider   albuterol (PROVENTIL HFA, VENTOLIN HFA, PROAIR HFA) 90 mcg/actuation inhaler Take 1 Puff by inhalation every six (6) hours as needed for Wheezing. 07/28/15  Yes Lynnea Ferrier, MD   metoprolol succinate (TOPROL-XL) 200 mg XL tablet TAKE 1 TABLET BY MOUTH DAILY FOR HIGH BLOOD PRESSURE 02/19/15  Yes Nau D Domah, MD   lisinopril (PRINIVIL, ZESTRIL) 40 mg tablet TAKE 1 TABLET BY MOUTH ONCE DAILY 02/19/15  Yes Erenest Rasher, MD   isosorbide dinitrate (ISORDIL) 30 mg tablet TAKE 1 TABLET BY MOUTH THREE TIMES DAILY 02/19/15  Yes Erenest Rasher, MD   cloNIDine HCl (CATAPRES)  0.1 mg tablet TAKE 1 TABLET BY MOUTH ONCE DAILY 02/19/15  Yes Erenest Rasher, MD   aspirin 81 mg chewable tablet Take 81 mg by mouth daily.   Yes Historical Provider     No Known Allergies   Social History   Substance Use Topics   ??? Smoking status: Never Smoker   ??? Smokeless tobacco: Never Used   ??? Alcohol use No      Family History   Problem Relation Age of Onset   ??? Diabetes Mother    ??? Arthritis-osteo Father    ??? COPD Sister    ??? Diabetes Brother    ??? Diabetes Brother         Current Facility-Administered Medications   Medication Dose Route Frequency   ??? albuterol-ipratropium (DUO-NEB) 2.5 MG-0.5 MG/3 ML  3 mL Nebulization Q6H RT   ??? amLODIPine (NORVASC) tablet 10 mg  10 mg Oral DAILY   ??? aspirin chewable tablet 81 mg  81 mg Oral DAILY   ??? cholecalciferol (VITAMIN D3) tablet 5,000 Units  5,000 Units Oral DAILY   ??? cloNIDine HCl (CATAPRES) tablet 0.1 mg  0.1 mg Oral DAILY    ??? glimepiride (AMARYL) tablet 4 mg  4 mg Oral ACB   ??? isosorbide dinitrate (ISORDIL) tablet 30 mg  30 mg Oral TID   ??? lisinopril (PRINIVIL, ZESTRIL) tablet 40 mg  40 mg Oral DAILY   ??? metoprolol succinate (TOPROL-XL) XL tablet 200 mg  200 mg Oral DAILY   ??? pravastatin (PRAVACHOL) tablet 20 mg  20 mg Oral QHS   ??? furosemide (LASIX) injection 40 mg  40 mg IntraVENous BID   ??? potassium chloride SR (KLOR-CON 10) tablet 10 mEq  10 mEq Oral BID   ??? predniSONE (DELTASONE) tablet 60 mg  60 mg Oral DAILY WITH BREAKFAST   ??? guaiFENesin ER (MUCINEX) tablet 600 mg  600 mg Oral Q12H   ??? insulin lispro (HUMALOG) injection   SubCUTAneous AC&HS   ??? sodium chloride (NS) flush 5-10 mL  5-10 mL IntraVENous Q8H   ??? enoxaparin (LOVENOX) injection 40 mg  40 mg SubCUTAneous Q12H   ??? azithromycin (ZITHROMAX) 500 mg in 0.9% sodium chloride (MBP/ADV) 250 mL  500 mg IntraVENous Q24H       Review of Systems:  A comprehensive review of systems was negative except for: Respiratory: positive for dyspnea on exertion    Objective:   Vital Signs:    Visit Vitals   ??? BP 146/74   ??? Pulse 70   ??? Temp 98.2 ??F (36.8 ??C)   ??? Resp 24   ??? Ht 5\' 6"  (1.676 m)   ??? Wt 147.8 kg (325 lb 12.8 oz)   ??? SpO2 100%   ??? BMI 52.59 kg/m2       O2 Device: Nasal cannula   O2 Flow Rate (L/min): 6 l/min   Temp (24hrs), Avg:97.9 ??F (36.6 ??C), Min:97.5 ??F (36.4 ??C), Max:98.2 ??F (36.8 ??C)       Intake/Output:   Last shift:      08/24 0701 - 08/24 1900  In: -   Out: 950 [Urine:950]  Last 3 shifts: 08/22 1901 - 08/24 0700  In: 250 [I.V.:250]  Out: 1100 [Urine:1100]    Intake/Output Summary (Last 24 hours) at 01/24/16 1333  Last data filed at 01/24/16 1219   Gross per 24 hour   Intake              250 ml   Output  2050 ml   Net            -1800 ml      Physical Exam:   General:  Alert, cooperative, no distress, appears stated age.   Head:  Normocephalic, without obvious abnormality, atraumatic.   Eyes:  Conjunctivae/corneas clear. PERRL, EOMs intact.    Nose: Nares normal. Septum midline. Mucosa normal. No drainage or sinus tenderness.   Throat: Lips, mucosa, and tongue normal. Teeth and gums normal.   Neck: Supple, symmetrical, trachea midline, no adenopathy, thyroid: no enlargment/tenderness/nodules, no carotid bruit and no JVD.   Back:   Symmetric, no curvature. ROM normal.   Lungs:   Clear to auscultation bilaterally. NO WHEEZING   Chest wall:  No tenderness or deformity.   Heart:  Regular rate and rhythm, S1, S2 normal, no murmur, click, rub or gallop.   Abdomen:   Soft, non-tender. Bowel sounds normal. No masses,  No organomegaly.   Extremities: Extremities normal, atraumatic, no cyanosis or edema.   Pulses: 2+ and symmetric all extremities.   Skin: Skin color, texture, turgor normal. No rashes or lesions   Lymph nodes: Cervical, supraclavicular, and axillary nodes normal.   Neurologic: Grossly nonfocal     Data review:     Recent Results (from the past 24 hour(s))   BLOOD GAS, ARTERIAL    Collection Time: 01/23/16  1:35 PM   Result Value Ref Range    pH 7.42 7.35 - 7.45      PCO2 53 (H) 35.0 - 45.0 mmHg    PO2 53 (L) 80 - 100 mmHg    O2 SAT 88 (L) 92 - 97 %    BICARBONATE 34 (H) 22 - 26 mmol/L    BASE EXCESS 7.3 mmol/L    O2 METHOD NASAL O2      O2 FLOW RATE 4.00 L/min    Sample source ARTERIAL      SITE LEFT RADIAL      ALLEN'S TEST YES     GLUCOSE, POC    Collection Time: 01/23/16  5:53 PM   Result Value Ref Range    Glucose (POC) 84 65 - 100 mg/dL    Performed by Harriet Masson    GLUCOSE, POC    Collection Time: 01/23/16  9:10 PM   Result Value Ref Range    Glucose (POC) 163 (H) 65 - 100 mg/dL    Performed by Ward Charlotte O    CBC WITH AUTOMATED DIFF    Collection Time: 01/24/16  3:08 AM   Result Value Ref Range    WBC 7.2 3.6 - 11.0 K/uL    RBC 3.96 3.80 - 5.20 M/uL    HGB 11.5 11.5 - 16.0 g/dL    HCT 16.1 09.6 - 04.5 %    MCV 89.4 80.0 - 99.0 FL    MCH 29.0 26.0 - 34.0 PG    MCHC 32.5 30.0 - 36.5 g/dL    RDW 40.9 (H) 81.1 - 14.5 %     PLATELET 178 150 - 400 K/uL    NEUTROPHILS 93 (H) 32 - 75 %    LYMPHOCYTES 6 (L) 12 - 49 %    MONOCYTES 1 (L) 5 - 13 %    EOSINOPHILS 0 0 - 7 %    BASOPHILS 0 0 - 1 %    ABS. NEUTROPHILS 6.8 1.8 - 8.0 K/UL    ABS. LYMPHOCYTES 0.4 (L) 0.8 - 3.5 K/UL    ABS. MONOCYTES 0.1 0.0 - 1.0  K/UL    ABS. EOSINOPHILS 0.0 0.0 - 0.4 K/UL    ABS. BASOPHILS 0.0 0.0 - 0.1 K/UL   METABOLIC PANEL, BASIC    Collection Time: 01/24/16  4:16 AM   Result Value Ref Range    Sodium 135 (L) 136 - 145 mmol/L    Potassium 4.0 3.5 - 5.1 mmol/L    Chloride 94 (L) 97 - 108 mmol/L    CO2 36 (H) 21 - 32 mmol/L    Anion gap 5 5 - 15 mmol/L    Glucose 170 (H) 65 - 100 mg/dL    BUN 23 (H) 6 - 20 MG/DL    Creatinine 1.61 (H) 0.55 - 1.02 MG/DL    BUN/Creatinine ratio 22 (H) 12 - 20      GFR est AA >60 >60 ml/min/1.33m2    GFR est non-AA 53 (L) >60 ml/min/1.77m2    Calcium 8.1 (L) 8.5 - 10.1 MG/DL   MAGNESIUM    Collection Time: 01/24/16  4:16 AM   Result Value Ref Range    Magnesium 1.8 1.6 - 2.4 mg/dL   GLUCOSE, POC    Collection Time: 01/24/16  8:05 AM   Result Value Ref Range    Glucose (POC) 135 (H) 65 - 100 mg/dL    Performed by Cora Collum (PCT)    GLUCOSE, POC    Collection Time: 01/24/16 12:21 PM   Result Value Ref Range    Glucose (POC) 159 (H) 65 - 100 mg/dL    Performed by Cora Collum (PCT)        Imaging:  I have personally reviewed the patient???s radiographs and have reviewed the reports:  CXR: pulmonary edema        Vickki Muff, MD

## 2016-01-25 LAB — METABOLIC PANEL, BASIC
Anion gap: 6 mmol/L (ref 5–15)
BUN/Creatinine ratio: 27 — ABNORMAL HIGH (ref 12–20)
BUN: 30 MG/DL — ABNORMAL HIGH (ref 6–20)
CO2: 31 mmol/L (ref 21–32)
Calcium: 7.9 MG/DL — ABNORMAL LOW (ref 8.5–10.1)
Chloride: 97 mmol/L (ref 97–108)
Creatinine: 1.11 MG/DL — ABNORMAL HIGH (ref 0.55–1.02)
GFR est AA: 59 mL/min/{1.73_m2} — ABNORMAL LOW (ref >60)
GFR est non-AA: 49 mL/min/{1.73_m2} — ABNORMAL LOW (ref >60)
Glucose: 124 mg/dL — ABNORMAL HIGH (ref 65–100)
Potassium: 4.7 mmol/L (ref 3.5–5.1)
Sodium: 134 mmol/L — ABNORMAL LOW (ref 136–145)

## 2016-01-25 LAB — CBC W/O DIFF
HCT: 36.3 % (ref 35.0–47.0)
HGB: 11.8 g/dL (ref 11.5–16.0)
MCH: 29 PG (ref 26.0–34.0)
MCHC: 32.5 g/dL (ref 30.0–36.5)
MCV: 89.2 FL (ref 80.0–99.0)
PLATELET: 192 10*3/uL (ref 150–400)
RBC: 4.07 M/uL (ref 3.80–5.20)
RDW: 17.2 % — ABNORMAL HIGH (ref 11.5–14.5)
WBC: 9.1 10*3/uL (ref 3.6–11.0)

## 2016-01-25 LAB — MAGNESIUM: Magnesium: 1.9 mg/dL (ref 1.6–2.4)

## 2016-01-25 LAB — GLUCOSE, POC
Glucose (POC): 334 mg/dL — ABNORMAL HIGH (ref 65–100)
Glucose (POC): 67 mg/dL (ref 65–100)
Glucose (POC): 65 mg/dL (ref 65–100)
Glucose (POC): 81 mg/dL (ref 65–100)
Glucose (POC): 50 mg/dL — ABNORMAL LOW (ref 65–100)
Glucose (POC): 51 mg/dL — ABNORMAL LOW (ref 65–100)
Glucose (POC): 56 mg/dL — ABNORMAL LOW (ref 65–100)
Glucose (POC): 75 mg/dL (ref 65–100)
Glucose (POC): 111 mg/dL — ABNORMAL HIGH (ref 65–100)
Glucose (POC): 108 mg/dL — ABNORMAL HIGH (ref 65–100)

## 2016-01-25 MED ORDER — FUROSEMIDE 10 MG/ML IJ SOLN
10 mg/mL | Freq: Two times a day (BID) | INTRAMUSCULAR | Status: DC
Start: 2016-01-25 — End: 2016-01-26
  Administered 2016-01-25 – 2016-01-26 (×2): via INTRAVENOUS

## 2016-01-25 MED ORDER — PREDNISONE 20 MG TAB
20 mg | Freq: Every day | ORAL | Status: DC
Start: 2016-01-25 — End: 2016-01-27
  Administered 2016-01-25 – 2016-01-27 (×3): via ORAL

## 2016-01-25 MED FILL — MUCINEX 600 MG TABLET, EXTENDED RELEASE: 600 mg | ORAL | Qty: 1

## 2016-01-25 MED FILL — LOVENOX 40 MG/0.4 ML SUBCUTANEOUS SYRINGE: 40 mg/0.4 mL | SUBCUTANEOUS | Qty: 0.4

## 2016-01-25 MED FILL — LISINOPRIL 20 MG TAB: 20 mg | ORAL | Qty: 2

## 2016-01-25 MED FILL — FUROSEMIDE 10 MG/ML IJ SOLN: 10 mg/mL | INTRAMUSCULAR | Qty: 6

## 2016-01-25 MED FILL — BD POSIFLUSH NORMAL SALINE 0.9 % INJECTION SYRINGE: INTRAMUSCULAR | Qty: 10

## 2016-01-25 MED FILL — POTASSIUM CHLORIDE SR 10 MEQ TAB: 10 mEq | ORAL | Qty: 1

## 2016-01-25 MED FILL — ISOSORBIDE DINITRATE 20 MG TAB: 20 mg | ORAL | Qty: 2

## 2016-01-25 MED FILL — PREDNISONE 20 MG TAB: 20 mg | ORAL | Qty: 2

## 2016-01-25 MED FILL — CHILDREN'S ASPIRIN 81 MG CHEWABLE TABLET: 81 mg | ORAL | Qty: 1

## 2016-01-25 MED FILL — FUROSEMIDE 10 MG/ML IJ SOLN: 10 mg/mL | INTRAMUSCULAR | Qty: 4

## 2016-01-25 MED FILL — DEX4 GLUCOSE 4 GRAM CHEWABLE TABLET: 4 gram | ORAL | Qty: 40

## 2016-01-25 MED FILL — VITAMIN D3 25 MCG (1,000 UNIT) TABLET: 25 mcg (1,000 unit) | ORAL | Qty: 5

## 2016-01-25 MED FILL — INCRUSE ELLIPTA 62.5 MCG/ACTUATION POWDER FOR INHALATION: 62.5 mcg/actuation | RESPIRATORY_TRACT | Qty: 7

## 2016-01-25 MED FILL — GLIMEPIRIDE 4 MG TAB: 4 mg | ORAL | Qty: 1

## 2016-01-25 MED FILL — METFORMIN 500 MG TAB: 500 mg | ORAL | Qty: 1

## 2016-01-25 MED FILL — INSULIN LISPRO 100 UNIT/ML INJECTION: 100 unit/mL | SUBCUTANEOUS | Qty: 1

## 2016-01-25 MED FILL — AMLODIPINE 5 MG TAB: 5 mg | ORAL | Qty: 2

## 2016-01-25 MED FILL — TOPROL XL 50 MG TABLET,EXTENDED RELEASE: 50 mg | ORAL | Qty: 4

## 2016-01-25 NOTE — Progress Notes (Signed)
Echo done

## 2016-01-25 NOTE — Other (Signed)
DTC Progress Note    Recommendations/ Comments: If appropriate, please consider changing correctional insulin scale to high sensitivity given pt current renal status. Pt received 4 units of correction last night and hypoglycemic this morning.     Chart reviewed on Jodi MourningMary Primmer for hypoglycemia.    Patient is a 65 y.o. female with known history of Type 2 Diabetes on Glimepiride 4mg  daily and Metformin 500mg  bid at home.    A1c:   No results found for: HBA1C, HGBE8, HBA1CEXT    Recent Glucose Results:   Lab Results   Component Value Date/Time    GLU 124 (H) 01/25/2016 02:17 AM    GLUCPOC 81 01/25/2016 08:34 AM    GLUCPOC 65 01/25/2016 08:33 AM    GLUCPOC 67 01/25/2016 08:12 AM        Lab Results   Component Value Date/Time    Creatinine 1.11 01/25/2016 02:17 AM     Estimated Creatinine Clearance: 73.6 mL/min (based on Cr of 1.11).    Active Orders   Diet    DIET DIABETIC CONSISTENT CARB Regular; 2 GM NA (House Low NA); FR 1500ML        PO intake: No data found.      Current hospital DM medication:   -Glimepiride 4mg  daily before breakfast  -Humalog normal sensitivity correction  -Metformin 500mg  bid    Will continue to follow as needed.    Thank you    Joanie Coddingtonanielle Hazelle Woollard, RD  [facility]  Office: [phone]

## 2016-01-25 NOTE — Progress Notes (Addendum)
Occupational Therapy EVALUATION/discharge  Patient: Brittney Shelton [phone] y.o. female)  Date: 01/25/2016  Primary Diagnosis: Acute on chronic respiratory failure with hypoxia and hypercapnia (HCC)  Acute on chronic diastolic (congestive) heart failure [facility])        Precautions:  Fall    ASSESSMENT:   Based on the objective data described below, the patient presents at her independent to mod I baseline, ambulating with a cane and incorporating the use of energy conservation/pacing techniques during ADLs PRN. She is normally on 4L NC at all times, now requiring 5L NC. SpO2 96% s/p activity, with minimal SOB noted. Patient verbalizing full understanding of all training/education provided as described below.  Further skilled acute occupational therapy is not indicated at this time.  Discharge Recommendations: None  Further Equipment Recommendations for Discharge: none        OBJECTIVE DATA SUMMARY:   HISTORY:   Past Medical History:   Diagnosis Date   ??? Chronic obstructive pulmonary disease (HCC)    ??? Congestive heart failure (HCC)    ??? Diabetes (HCC)    ??? Hypertension    History reviewed. No pertinent surgical history.    Prior Level of Function/Home Situation: Independent to mod I for ADLs and IADLs, mod I ambulating with a cane. Describes the use of energy conservation techniques at baseline.   Expanded or extensive additional review of patient history:     Home Situation  Home Environment: Private residence  # Steps to Enter: 3  Rails to Enter: Yes  One/Two Story Residence: One story  Living Alone: No  Support Systems: Family member(s)  Patient Expects to be Discharged to:: Private residence  Current DME Used/Available at Home: Cane, straight, Oxygen, portable, Walker, rolling  Tub or Shower Type: Tub/Shower combination    Right hand dominant     Left hand dominant    EXAMINATION OF PERFORMANCE DEFICITS:  Cognitive/Behavioral Status:  Neurologic State: Alert  Orientation Level: Oriented X4   Cognition: Appropriate decision making;Appropriate for age attention/concentration;Appropriate safety awareness;Follows commands  Perception: Appears intact  Safety/Judgement: Awareness of environment;Insight into deficits      Edema: BLE lympehedema    Hearing:  Auditory  Auditory Impairment: None    Vision/Perceptual:           Acuity: Within Defined Limits         Range of Motion:  AROM: Generally decreased, functional     Strength:  Strength: Within functional limits     Coordination:  Coordination: Within functional limits       Tone & Sensation:  Sensation: Intact     Balance:  Sitting: Intact  Standing: Intact    Functional Mobility and Transfers for ADLs:  Patient presented up in chair    Transfers:  Sit to Stand: Modified independent  Stand to Sit: Modified independent  Bed to Chair: Modified independent (ambulating with a cane)  Toilet Transfer : Independent    ADL Assessment:  Feeding: Independent    Oral Facial Hygiene/Grooming: Independent    Bathing: Independent    Upper Body Dressing: Independent    Lower Body Dressing: Independent    Toileting: Independent     ADL Intervention and task modifications:  Educated patient on the purpose of energy conservation techniques to be used to increase her ability to participate in desired ADLs, IADLs and leisure activities, while minimizing fatigue. Further reinforced taking rest breaks frequently to avoid becoming SOB, alternating performance of heavy and light activities, sitting PRN to complete activities, and taking mid  day naps to allow her to recover from morning activities which will in turn allow her to be active in the afternoon. Also recommended taking 10 to 15 minute sitting rest breaks for every hour of activity performed. Recommended modifying home set up to increased efficiency during ADL/IADL setup, ultimately minimizing the number of trips required to gather needed items. Patient verbalized full understanding of all energy conservation  teaching provided, and reports uses such techniques at baseline.         Provided purse lip breathing training, and patient able to demonstrate full understanding.    Issued energy conservation and pursed lip breathing hand outs    Recommended purchase of shower seat, and grab bars to be place in tub/shower combination.   Functional Measure:  Barthel Index:    Bathing: 5  Bladder: 5  Bowels: 10  Grooming: 5  Dressing: 10  Feeding: 10  Mobility: 10  Stairs: 0  Toilet Use: 10  Transfer (Bed to Chair and Back): 15  Total: 80       Barthel and G-code impairment scale:  Percentage of impairment CH  0% CI  1-19% CJ  20-39% CK  40-59% CL  60-79% CM  80-99% CN  100%   Barthel Score 0-100 100 99-80 79-60 59-40 20-39 1-19   0   Barthel Score 0-20 20 17-19 13-16 9-12 5-8 1-4 0      The Barthel ADL Index: Guidelines  1. The index should be used as a record of what a patient does, not as a record of what a patient could do.  2. The main aim is to establish degree of independence from any help, physical or verbal, however minor and for whatever reason.  3. The need for supervision renders the patient not independent.  4. A patient's performance should be established using the best available evidence. Asking the patient, friends/relatives and nurses are the usual sources, but direct observation and common sense are also important. However direct testing is not needed.  5. Usually the patient's performance over the preceding 24-48 hours is important, but occasionally longer periods will be relevant.  6. Middle categories imply that the patient supplies over 50 per cent of the effort.  7. Use of aids to be independent is allowed.    Clarisa Kindred., Barthel, D.W. [phone]). Functional evaluation: the Barthel Index. Md State Med J (14)2.  Zenaida Niece der [place], J.J.M.F, [place], Ian Malkin., Margret Chance., [place], [place]. (1999). Measuring the change indisability after inpatient rehabilitation;  comparison of the responsiveness of the Barthel Index and Functional Independence Measure. Journal of Neurology, Neurosurgery, and Psychiatry, 66(4), [phone].  Dawson Bills, N.J.A, [place],  W.J.M, & Koopmanschap, M.A. (2004.) Assessment of post-stroke quality of life in cost-effectiveness studies: The usefulness of the Barthel Index and the EuroQoL-5D. Quality of Life Research, 13, 098-11       G codes:  In compliance with CMS???s Claims Based Outcome Reporting, the following G-code set was chosen for this patient based on their primary functional limitation being treated:    The outcome measure chosen to determine the severity of the functional limitation was the Barthel Index with a score of 80/100 which was correlated with the impairment scale.    ? Self Care:    [phone] - CURRENT STATUS: CI - 1%-19% impaired, limited or restricted   G9562 - GOAL STATUS: CI - 1%-19% impaired, limited or restricted   Z3086 - D/C STATUS:  CI - 1%-19% impaired, limited or restricted  Pain:Pain Scale 1: Numeric (0 - 10)  Pain Intensity 1: 0              Activity Tolerance:   SpO2: 96% on 5 L NC s/p activity. Patient on 4L at baseline.   Please refer to the flowsheet for vital signs taken during this treatment.  After treatment:     Patient left in no apparent distress sitting up in chair    Patient left in no apparent distress in bed    Call bell left within reach    Nursing notified    Caregiver present    Bed alarm activated    COMMUNICATION/EDUCATION:   Communication/Collaboration:        Home safety education was provided and the patient/caregiver indicated understanding.        Patient/family have participated as able and agree with findings and recommendations.        Patient is unable to participate in plan of care at this time.  Findings and recommendations were discussed with: Physical Therapist    Michel Harrow, OTR/L  Time Calculation: 20 mins

## 2016-01-25 NOTE — Progress Notes (Signed)
Problem: Falls - Risk of  Goal: *Absence of Falls  Document Schmid Fall Risk and appropriate interventions in the flowsheet.   Outcome: Progressing Towards Goal  Fall Risk Interventions:  Mobility Interventions: Bed/chair exit alarm, Communicate number of staff needed for ambulation/transfer, Patient to call before getting OOB, Utilize walker, cane, or other assitive device           Medication Interventions: Bed/chair exit alarm, Patient to call before getting OOB, Teach patient to arise slowly     Elimination Interventions: Bed/chair exit alarm, Call light in reach, Patient to call for help with toileting needs, Toilet paper/wipes in reach, Toileting schedule/hourly rounds

## 2016-01-25 NOTE — Other (Signed)
Cardiology follow up scheduled. Duke SalviaSatina Grider CM Specialist [phone],

## 2016-01-25 NOTE — Progress Notes (Signed)
Problem: Mobility Impaired (Adult and Pediatric)  Goal: *Acute Goals and Plan of Care (Insert Text)  Physical Therapy Goals  Initiated 01/25/2016  1. Patient will move from supine to sit and sit to supine in bed with modified independence within 7 day(s).   2. Patient will transfer from bed to chair and chair to bed with modified independence using the least restrictive device within 7 day(s).  3. Patient will perform sit to stand with modified independence within 7 day(s).  4. Patient will ambulate with modified independence for 100 feet with the least restrictive device within 7 day(s).   PHYSICAL THERAPY EVALUATION  Patient: Brittney Shelton [phone] y.o. female)  Date: 01/25/2016  Primary Diagnosis: Acute on chronic respiratory failure with hypoxia and hypercapnia (HCC)  Acute on chronic diastolic (congestive) heart failure [facility])        Precautions:   Fall      ASSESSMENT :  Based on the objective data described below, the patient presents with decreased functional mobility from baseline level of function.  Prior to admit patient was limited by respiratory status and general decreased activity tolerance.  Patient currently needing supervision for bed mobility and S/CGa for transfers. Amb approx 20 feet with SPC and CGA-close SBA with no overt LOB but has slow cadence with decreased step clearance.  Amb on 5L O2 and O2 does drop but did not get good reading on pulse ox.  Patient left up in chair with needs in reach. Anticipate patient could benefit from [facility] PT at [place] to ensure safety in the home and for general conditioning.  Will follow 3x/week for PT as she is near her baseline.      Patient will benefit from skilled intervention to address the above impairments.  Patient???s rehabilitation potential is considered to be Good  Factors which may influence rehabilitation potential include:   [X]          None noted  [ ]          Mental ability/status  [ ]          Medical condition   [ ]          Home/family situation and support systems  [ ]          Safety awareness  [ ]          Pain tolerance/management  [ ]          Other:        PLAN :  Recommendations and Planned Interventions:  [X]            Bed Mobility Training             [ ]     Neuromuscular Re-Education  [X]            Transfer Training                   [ ]     Orthotic/Prosthetic Training  [X]            Gait Training                         [ ]     Modalities  [X]            Therapeutic Exercises           [ ]     Edema Management/Control  [X]            Therapeutic Activities            [X]   Patient and Family Training/Education  [ ]            Other (comment):     Frequency/Duration: Patient will be followed by physical therapy  3 times a week to address goals.  Discharge Recommendations: Home Health  Further Equipment Recommendations for Discharge: TBD       SUBJECTIVE:   Patient stated "I think I'm better bc I'm not gasping for air.???      OBJECTIVE DATA SUMMARY:   HISTORY:    Past Medical History:   Diagnosis Date   ??? Chronic obstructive pulmonary disease (HCC)     ??? Congestive heart failure (HCC)     ??? Diabetes (HCC)     ??? Hypertension     History reviewed. No pertinent surgical history.  Prior Level of Function/Home Situation: Independent using SPC.  Lives with brother.  Does not drive  Personal factors and/or comorbidities impacting plan of care:      Home Situation  Home Environment: Private residence  # Steps to Enter: 3  Rails to Enter: Yes  One/Two Story Residence: One story  Living Alone: No  Support Systems: Family member(s)  Patient Expects to be Discharged to:: Private residence  Current DME Used/Available at Home: Gilmer Mor, straight, Oxygen, portable, Walker, rolling     EXAMINATION/PRESENTATION/DECISION MAKING:   Critical Behavior:  Neurologic State: Alert, Appropriate for age  Orientation Level: Oriented X4        Hearing:  Auditory  Auditory Impairment: None     Range Of Motion:  AROM: Generally decreased, functional                        Strength:    Strength: Generally decreased, functional            Functional Mobility:  Bed Mobility:  Rolling: Modified independent  Supine to Sit: Supervision  Sit to Supine: Supervision  Scooting: Supervision  Transfers:  Sit to Stand: Supervision  Stand to Sit: Supervision                       Balance:   Sitting: Intact  Standing: Intact  Ambulation/Gait Training:  Distance (ft): 20 Feet (ft)  Assistive Device: Cane, straight  Ambulation - Level of Assistance: Stand-by asssistance     Gait Description (WDL): Exceptions to WDL  Gait Abnormalities: Decreased step clearance;Shuffling gait        Base of Support: Widened     Speed/Cadence: Shuffled;Slow  Step Length: Left shortened;Right shortened           Functional Measure:  Barthel Index:      Bathing: 0  Bladder: 5  Bowels: 10  Grooming: 5  Dressing: 5  Feeding: 10  Mobility: 10  Stairs: 0  Toilet Use: 10  Transfer (Bed to Chair and Back): 10  Total: 65         Barthel and G-code impairment scale:  Percentage of impairment CH  0% CI  1-19% CJ  20-39% CK  40-59% CL  60-79% CM  80-99% CN  100%   Barthel Score 0-100 100 99-80 79-60 59-40 20-39 1-19    0   Barthel Score 0-20 20 17-19 13-16 9-12 5-8 1-4 0      The Barthel ADL Index: Guidelines  1. The index should be used as a record of what a patient does, not as a record of what a patient could do.  2. The main aim is to establish  degree of independence from any help, physical or verbal, however minor and for whatever reason.  3. The need for supervision renders the patient not independent.  4. A patient's performance should be established using the best available evidence. Asking the patient, friends/relatives and nurses are the usual sources, but direct observation and common sense are also important. However direct testing is not needed.  5. Usually the patient's performance over the preceding 24-48 hours is important, but occasionally longer periods will be relevant.   6. Middle categories imply that the patient supplies over 50 per cent of the effort.  7. Use of aids to be independent is allowed.     Clarisa Kindred., Barthel, D.W. [phone]). Functional evaluation: the Barthel Index. Md State Med J (14)2.  Zenaida Niece der [place], J.J.M.F, [place], Ian Malkin., Margret Chance., [place], [place]. (1999). Measuring the change indisability after inpatient rehabilitation; comparison of the responsiveness of the Barthel Index and Functional Independence Measure. Journal of Neurology, Neurosurgery, and Psychiatry, 66(4), [phone].  Dawson Bills, N.J.A, [place],  W.J.M, & Koopmanschap, M.A. (2004.) Assessment of post-stroke quality of life in cost-effectiveness studies: The usefulness of the Barthel Index and the EuroQoL-5D. Quality of Life Research, 13, 962-95         G codes:  In compliance with CMS???s Claims Based Outcome Reporting, the following G-code set was chosen for this patient based on their primary functional limitation being treated:     The outcome measure chosen to determine the severity of the functional limitation was the Barthel with a score of 65/100 which was correlated with the impairment scale.      ?? Mobility - Walking and Moving Around:              M8413 - CURRENT STATUS:    CJ - 20%-39% impaired, limited or restricted              K4401 - GOAL STATUS:           CI - 1%-19% impaired, limited or restricted              U2725 - D/C STATUS:                       ---------------To be determined---------------      Pain:  Pain Scale 1: Numeric (0 - 10)  Pain Intensity 1: 0              Activity Tolerance:   VSS  Please refer to the flowsheet for vital signs taken during this treatment.  After treatment:   [X]          Patient left in no apparent distress sitting up in chair  [ ]          Patient left in no apparent distress in bed  [X]          Call bell left within reach  [X]          Nursing notified  [X]          Caregiver present  [ ]          Bed alarm activated       COMMUNICATION/EDUCATION:   The patient???s plan of care was discussed with: Physical Therapist, Occupational Therapist and Registered Nurse.  [X]          Fall prevention education was provided and the patient/caregiver indicated understanding.  [X]          Patient/family have participated as able in goal setting and plan of care.  [  X]         Patient/family agree to work toward stated goals and plan of care.  [ ]          Patient understands intent and goals of therapy, but is neutral about his/her participation.  [ ]          Patient is unable to participate in goal setting and plan of care.     Thank you for this referral.  Leverne Humbles, PT, DPT   Time Calculation: 20 mins

## 2016-01-25 NOTE — Home Health (Signed)
Please note that this patient was open to [facility] nursing and PT services at the time of hospital admission. If services are needed at hospital discharge, please order to resume them. Thank you.   Amy Sharp RN  [facility]  [phone]

## 2016-01-25 NOTE — Progress Notes (Addendum)
7:54 AM  Report received from Talbot Grumblingiffany Fischer, RN. SBAR, Kardex, Procedure Summary, Intake/Output, MAR, Recent Results, Med Rec Status and Cardiac Rhythm NSR were discussed.    Annitta JerseySarah K Xochil Shanker, RN    [facility] SHIFT NURSING NOTE    Bedside and Verbal shift change report given to Bobbe MedicoSarah Johnson, RN (oncoming nurse) by Ernesta AmbleSarah Akin Yi, RN (offgoing nurse). Report included the following information SBAR, Kardex, Procedure Summary, Intake/Output, MAR, Recent Results, Med Rec Status and Cardiac Rhythm NSR.    SHIFT SUMMARY: Patient with 2 hypoglycemic episodes today.  Metformin held.    Admission Date 01/23/2016   Admission Diagnosis Acute on chronic respiratory failure with hypoxia and hypercapnia (HCC)  Acute on chronic diastolic (congestive) heart failure (HCC)   Consults IP CONSULT TO PULMONOLOGY  IP CONSULT TO CARDIOLOGY        Consults    PT    OT    Speech    Palliative       Hospice     Case Management    None   Cardiac Monitoring    Yes    No     Antibiotics    Yes    No   GI Prophylaxis  (Ex: Protonix, Pepcid, etc,.)    Yes    No          DVT Prophylaxis   SCDs:             Ted stockings:          Medication (Ex: Lovenox, Eliquis, Brilinta, Coumadin,  Heparin, etc..)    Contraindicated    No VTE needed       Urinary Catheter             LDAs               Peripheral IV 01/24/16 Right Arm (Active)   Site Assessment Clean, dry, & intact 01/25/2016  3:50 PM   Phlebitis Assessment 0 01/25/2016  3:50 PM   Infiltration Assessment 0 01/25/2016  3:50 PM   Dressing Status Clean, dry, & intact 01/25/2016  3:50 PM   Dressing Type Tape;Transparent 01/25/2016  3:50 PM   Hub Color/Line Status Pink;Flushed 01/25/2016  3:50 PM   Action Taken Other (comment) 01/24/2016 12:04 PM   Alcohol Cap Used No 01/24/2016 12:04 PM                      I/O???s   Intake/Output Summary (Last 24 hours) at 01/25/16 1850  Last data filed at 01/25/16 1741   Gross per 24 hour   Intake              770 ml   Output              525 ml   Net              245 ml          Activity Level Activity Level: Up with Assistance     Activity Assistance: Partial (one person)   Diet Active Orders   Diet    DIET DIABETIC CONSISTENT CARB Regular; 2 GM NA (House Low NA); FR 1500ML      Purposeful Rounding every 1-2 hour?    Yes    Schmid Score  Total Score: 3   Bed Alarm (If score 3 or >)    Yes     Refused (See signed refusal form in chart)   Braden Score  Braden Score: 18  Braden Score (if score 14 or less)    PMT consult    Nutrition consult    Wound Care consult        Specialty bed         Influenza Vaccine Received Flu Vaccine for Current Season (usually Sept-March): Not Flu Season               Needs prior to discharge:   Home O2 required:     Yes    No     If yes, how much O2 required?    Other:    Last Bowel Movement Date: 01/25/16   Readmission Risk Assessment Tool Score High Risk            25       Total Score        3 Has Seen PCP in Last 6 Months (Yes=3, No=0)    4 IP Visits Last 12 Months (1-3=4, 4=9, >4=11)    9 Pt. Coverage (Medicare=5 , Medicaid, or Self-Pay=4)    9 Charlson Comorbidity Score (Age + Comorbid Conditions)        Criteria that do not apply:    Married. Living with Significant Other. Assisted Living. LTAC. SNF. or   Rehab    Patient Length of Stay (>5 days = 3)       Expected Length of Stay 4d 14h   Actual Length of Stay 2

## 2016-01-25 NOTE — Other (Signed)
C/p Rehab note- chart reviewed. Patient is on CHF bundle list  ??  ??  Adm with Acute on chronic hypoxic & hypercapneic??respiratory failure POA -   Likely due to Mild acute COPD exacerbation POA??  Acute on Chronic diastolic dysfunction HF EF 60% POA.  Echo on 01/23/16, results pending.  ??  HX includes HTN,T2DM,Hypercholesterolemia,COPD,Home oxygen, CHF. Nonsmoker.    Attempted to meet pt nurse in room will return shortly.  Returned later and pt is now in restroom.      Met with pt to reinforce prior teaching re: CHF, S&Ss, medication management, Low NA diet, daily weights and when to call the doctor.     Pt was able to answer weight parameters correctly.  She reads food labels on  packages and reminded her to keep NA intake to no more than 1500 mg per day.  She walks on days that it is not to hot down her driveway and back.  Demonstrated chair exercises she can preform.    Pt without questions but reinforcement recommended.

## 2016-01-25 NOTE — Progress Notes (Signed)
[address], [place], [place] [id number]  [phone]      Cardiology Progress Note      01/25/2016 1230 PM    Admit Date: 01/23/2016    Admit Diagnosis:   Acute on chronic respiratory failure with hypoxia and hypercapnia (HCC)  Acute on chronic diastolic (congestive) heart failure (HCC)    Subjective:     Brittney Shelton is a 65 y.o. female with PMH COPD on home O2, HF, HTN, DM who was admitted for Acute on chronic respiratory failure with hypoxia and hypercapnia (HCC)  Acute on chronic diastolic (congestive) heart failure (HCC).    Overnight Events:  -VSS  -creatinine 1.11  -Weight increase 1#; I/O incomplete  -Ms. Brittney Shelton states she's feeling about the same that she was yesterday, close to her baseline.  She has not been weaned down to her home O2 level yet.  No chest pain or palpitations.     Visit Vitals   ??? BP 132/68 (BP 1 Location: Left arm, BP Patient Position: Sitting)   ??? Pulse 62   ??? Temp 97.9 ??F (36.6 ??C)   ??? Resp 20   ??? Ht 5\' 6"  (1.676 m)   ??? Wt 148 kg (326 lb 4.8 oz)   ??? SpO2 98%   ??? BMI 52.67 kg/m2       Current Facility-Administered Medications   Medication Dose Route Frequency   ??? [START ON 01/26/2016] predniSONE (DELTASONE) tablet 20 mg  20 mg Oral DAILY WITH BREAKFAST   ??? furosemide (LASIX) injection 40 mg  40 mg IntraVENous ACB&D   ??? metFORMIN (GLUCOPHAGE) tablet 500 mg  500 mg Oral BID WITH MEALS   ??? albuterol-ipratropium (DUO-NEB) 2.5 MG-0.5 MG/3 ML  3 mL Nebulization Q6H PRN   ??? umeclidinium (INCRUSE ELLIPTA) 62.5 mcg/actuation  1 Puff Inhalation DAILY   ??? acetaminophen (TYLENOL) tablet 650 mg  650 mg Oral Q4H PRN   ??? ondansetron (ZOFRAN) injection 4 mg  4 mg IntraVENous Q6H PRN   ??? hydrALAZINE (APRESOLINE) 20 mg/mL injection 10 mg  10 mg IntraVENous Q6H PRN   ??? amLODIPine (NORVASC) tablet 10 mg  10 mg Oral DAILY   ??? aspirin chewable tablet 81 mg  81 mg Oral DAILY   ??? cholecalciferol (VITAMIN D3) tablet 5,000 Units  5,000 Units Oral DAILY    ??? isosorbide dinitrate (ISORDIL) tablet 30 mg  30 mg Oral TID   ??? lisinopril (PRINIVIL, ZESTRIL) tablet 40 mg  40 mg Oral DAILY   ??? metoprolol succinate (TOPROL-XL) XL tablet 200 mg  200 mg Oral DAILY   ??? pravastatin (PRAVACHOL) tablet 20 mg  20 mg Oral QHS   ??? guaiFENesin ER (MUCINEX) tablet 600 mg  600 mg Oral Q12H   ??? insulin lispro (HUMALOG) injection   SubCUTAneous AC&HS   ??? glucose chewable tablet 16 g  4 Tab Oral PRN   ??? glucagon (GLUCAGEN) injection 1 mg  1 mg IntraMUSCular PRN   ??? sodium chloride (NS) flush 5-10 mL  5-10 mL IntraVENous Q8H   ??? sodium chloride (NS) flush 5-10 mL  5-10 mL IntraVENous PRN   ??? enoxaparin (LOVENOX) injection 40 mg  40 mg SubCUTAneous Q12H   ??? dextrose 10% infusion 125-250 mL  125-250 mL IntraVENous PRN       Objective:      Physical Exam:  General: pleasant, morbidly obese, AAF sitting in chair in NAD  Heart: RRR, no m/S3/JVD  Lungs: clear, slightly coarse bilaterally    Abdomen: obese, Soft, +BS,  NTND   Extremities: lymphedema vs obesity obscuring ability to assess LE edema.  Neurologic: Grossly normal  Skin:?? Warm and dry.??    Data Review:   Recent Labs      01/25/16   0217  01/24/16   0308  01/23/16   1225   WBC  9.1  7.2  5.9   HGB  11.8  11.5  11.8   HCT  36.3  35.4  38.0   PLT  192  178  182     Recent Labs      01/25/16   0217  01/24/16   0416  01/23/16   1225   NA  134*  135*  138   K  4.7  4.0  3.3*   CL  97  94*  94*   CO2  31  36*  41*   GLU  124*  170*  128*   BUN  30*  23*  18   CREA  1.11*  1.04*  0.94   CA  7.9*  8.1*  8.2*   MG  1.9  1.8  1.7   ALB   --    --   3.4*   TBILI   --    --   1.5*   SGOT   --    --   10*   ALT   --    --   10*       Recent Labs      01/23/16   1225   TROIQ  <0.04         Intake/Output Summary (Last 24 hours) at 01/25/16 1419  Last data filed at 01/25/16 1610   Gross per 24 hour   Intake              530 ml   Output              375 ml   Net              155 ml        Telemetry: SR  ECG: NSR  CXRAY: mild pulm edema     Assessment:      Active Problems:    Acute on chronic respiratory failure with hypoxia and hypercapnia (HCC) (01/23/2016)      Acute on chronic diastolic (congestive) heart failure (HCC) (01/23/2016)        Plan:     Respiratory distress:  Combination of acute on chronic COPD and acute on chronic diastolic HF.  Weight documented up 1# despite diuresis.  Labs stable.    ?? Continue diuresis and potassium supplementation  ?? Continue ASA, BB.     ?? Patient c/o a chronic dry cough that she suspects is from her medications with how long she's had the cough.  Will switch her ACEi to an ARB.  ?? BP controlled on current regimen       RN states patient with low BGL today and patient does not take all of her prescribed medications daily, which is likely cause.  Discharge may be delayed.   See note that CM made a follow-up appointment with Dr. Orvil Feil already, and this will likely need changed if discharge is delayed until the weekend.        [name], NP  DNP, RN, AGACNP-BC

## 2016-01-25 NOTE — Progress Notes (Signed)
Hospitalist Progress Note    NAME: Brittney Shelton   DOB:  1951-01-21   MRN:  [id number]       Assessment / Plan:  Acute on chronic combined hypoxic/hypercapneic respiratory failure due to acute diastolic CHF with pulmonary edema and acute COPD exacerbation in setting of morbid obesity:  Echo 4/17 with EF 60-65%. There were no regional wall motion abnormalities. Wall thickness was mildly increased.  - CXR on admission with mild pulmonary edema in the presumed setting of heart failure  - con't IV lasix (on po at home) with fluid restriction  - strict I/Os, daily weights  - con't prednisone, dose decreased again today as volume overload felt to be more significant contributor to this presentation.  - con't mucinex.  - con't nebs prn.  Started incruse this admission.  - pulmonology and cardiology consults appreciated, will have close f/u in the office next week.  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  ????  Code Status: DNR  Surrogate Decision Maker: brother Molinda Bailiffrnest Smith??  DVT Prophylaxis:??lovenox      Subjective:     Chief Complaint / Reason for Physician Visit  "I am feeling better".  Discussed with RN events overnight.     Review of Systems:  Symptom Y/N Comments  Symptom Y/N Comments   Fever/Chills n   Chest Pain n    Poor Appetite n   Edema n    Cough n   Abdominal Pain n    Sputum n   Joint Pain     SOB/DOE n   Pruritis/Rash     Nausea/vomit    Tolerating PT/OT     Diarrhea    Tolerating Diet     Constipation    Other       Could NOT obtain due to:      Objective:     VITALS:   Last 24hrs VS reviewed since prior progress note. Most recent are:  Patient Vitals for the past 24 hrs:    Temp Pulse Resp BP SpO2   01/25/16 0808 98 ??F (36.7 ??C) 61 22 114/68 96 %   01/25/16 0400 97.6 ??F (36.4 ??C) 68 20 127/56 94 %   01/24/16 2320 98.1 ??F (36.7 ??C) 85 19 122/47 93 %   01/24/16 2006 97.6 ??F (36.4 ??C) 75 20 127/53 91 %   01/24/16 1458 97.4 ??F (36.3 ??C) 69 22 120/41 94 %   01/24/16 1219 98.2 ??F (36.8 ??C) 70 24 146/74 100 %       Intake/Output Summary (Last 24 hours) at 01/25/16 0904  Last data filed at 01/25/16 0400   Gross per 24 hour   Intake              290 ml   Output             1325 ml   Net            -1035 ml        PHYSICAL EXAM:  General: WD, WN. Alert, cooperative, no acute distress????  EENT:  EOMI. Anicteric sclerae. MMM  Resp:  CTA bilaterally, no wheezing or rales.  No accessory muscle use.  Poor air movement.  CV:  Regular  Rhythm,??Chronic lymphedema changes.  GI:  Soft, moderately distended, Non tender. ??+Bowel sounds  Neurologic:?? Alert and oriented X 3, normal speech,   Psych:???? Good insight.??Not anxious nor agitated  Skin:  No rashes.  No jaundice    Reviewed most current lab test results and cultures  YES  Reviewed most current radiology test results   YES  Review and summation of old records today    NO  Reviewed patient's current orders and MAR    YES  PMH/SH reviewed - no change compared to H&P  ________________________________________________________________________  Care Plan discussed with:    Comments   Patient x    Family      RN x    Care Manager x    Consultant                       x Multidiciplinary team rounds were held today with case manager, nursing, pharmacist and clinical coordinator.  Patient's plan of care was discussed; medications were reviewed and discharge planning was addressed.     ________________________________________________________________________  Total NON critical care TIME:  25 Minutes    Total CRITICAL CARE TIME Spent:   Minutes non procedure based      Comments   >50% of visit spent in counseling and coordination of care x     ________________________________________________________________________  Fredirick LatheAnn M Ciela Mahajan, MD     Procedures: see electronic medical records for all procedures/Xrays and details which were not copied into this note but were reviewed prior to creation of Plan.      LABS:  I reviewed today's most current labs and imaging studies.  Pertinent labs include:  Recent Labs      01/25/16   0217  01/24/16   0308  01/23/16   1225   WBC  9.1  7.2  5.9   HGB  11.8  11.5  11.8   HCT  36.3  35.4  38.0   PLT  192  178  182     Recent Labs      01/25/16   0217  01/24/16   0416  01/23/16   1225   NA  134*  135*  138   K  4.7  4.0  3.3*   CL  97  94*  94*   CO2  31  36*  41*   GLU  124*  170*  128*   BUN  30*  23*  18   CREA  1.11*  1.04*  0.94   CA  7.9*  8.1*  8.2*   MG  1.9  1.8  1.7   ALB   --    --   3.4*   TBILI   --    --   1.5*   SGOT   --    --   10*   ALT   --    --   10*       Signed: Fredirick LatheAnn M Trace Cederberg, MD

## 2016-01-26 LAB — CBC W/O DIFF
HCT: 36.1 % (ref 35.0–47.0)
HGB: 11.2 g/dL — ABNORMAL LOW (ref 11.5–16.0)
MCH: 27.9 PG (ref 26.0–34.0)
MCHC: 31 g/dL (ref 30.0–36.5)
MCV: 89.8 FL (ref 80.0–99.0)
PLATELET: 195 10*3/uL (ref 150–400)
RBC: 4.02 M/uL (ref 3.80–5.20)
RDW: 17.3 % — ABNORMAL HIGH (ref 11.5–14.5)
WBC: 8.9 10*3/uL (ref 3.6–11.0)

## 2016-01-26 LAB — GLUCOSE, POC
Glucose (POC): 159 mg/dL — ABNORMAL HIGH (ref 65–100)
Glucose (POC): 123 mg/dL — ABNORMAL HIGH (ref 65–100)
Glucose (POC): 69 mg/dL (ref 65–100)
Glucose (POC): 77 mg/dL (ref 65–100)
Glucose (POC): 142 mg/dL — ABNORMAL HIGH (ref 65–100)
Glucose (POC): 207 mg/dL — ABNORMAL HIGH (ref 65–100)

## 2016-01-26 LAB — METABOLIC PANEL, BASIC
Anion gap: 4 mmol/L — ABNORMAL LOW (ref 5–15)
BUN/Creatinine ratio: 35 — ABNORMAL HIGH (ref 12–20)
BUN: 32 MG/DL — ABNORMAL HIGH (ref 6–20)
CO2: 37 mmol/L — ABNORMAL HIGH (ref 21–32)
Calcium: 7.8 MG/DL — ABNORMAL LOW (ref 8.5–10.1)
Chloride: 95 mmol/L — ABNORMAL LOW (ref 97–108)
Creatinine: 0.92 MG/DL (ref 0.55–1.02)
GFR est AA: >60 mL/min/{1.73_m2} (ref >60)
GFR est non-AA: >60 mL/min/{1.73_m2} (ref >60)
Glucose: 72 mg/dL (ref 65–100)
Potassium: 3.9 mmol/L (ref 3.5–5.1)
Sodium: 136 mmol/L (ref 136–145)

## 2016-01-26 MED ORDER — FUROSEMIDE 40 MG TAB
40 mg | Freq: Two times a day (BID) | ORAL | Status: DC
Start: 2016-01-26 — End: 2016-01-27
  Administered 2016-01-26 – 2016-01-27 (×2): via ORAL

## 2016-01-26 MED FILL — PREDNISONE 20 MG TAB: 20 mg | ORAL | Qty: 1

## 2016-01-26 MED FILL — MAPAP (ACETAMINOPHEN) 325 MG TABLET: 325 mg | ORAL | Qty: 2

## 2016-01-26 MED FILL — ISOSORBIDE DINITRATE 20 MG TAB: 20 mg | ORAL | Qty: 2

## 2016-01-26 MED FILL — VITAMIN D3 25 MCG (1,000 UNIT) TABLET: 25 mcg (1,000 unit) | ORAL | Qty: 5

## 2016-01-26 MED FILL — LOVENOX 40 MG/0.4 ML SUBCUTANEOUS SYRINGE: 40 mg/0.4 mL | SUBCUTANEOUS | Qty: 0.4

## 2016-01-26 MED FILL — CHILDREN'S ASPIRIN 81 MG CHEWABLE TABLET: 81 mg | ORAL | Qty: 1

## 2016-01-26 MED FILL — MUCINEX 600 MG TABLET, EXTENDED RELEASE: 600 mg | ORAL | Qty: 1

## 2016-01-26 MED FILL — PRAVASTATIN 10 MG TAB: 10 mg | ORAL | Qty: 2

## 2016-01-26 MED FILL — INSULIN LISPRO 100 UNIT/ML INJECTION: 100 unit/mL | SUBCUTANEOUS | Qty: 1

## 2016-01-26 MED FILL — AMLODIPINE 5 MG TAB: 5 mg | ORAL | Qty: 2

## 2016-01-26 MED FILL — TOPROL XL 50 MG TABLET,EXTENDED RELEASE: 50 mg | ORAL | Qty: 4

## 2016-01-26 MED FILL — BD POSIFLUSH NORMAL SALINE 0.9 % INJECTION SYRINGE: INTRAMUSCULAR | Qty: 10

## 2016-01-26 MED FILL — FUROSEMIDE 10 MG/ML IJ SOLN: 10 mg/mL | INTRAMUSCULAR | Qty: 4

## 2016-01-26 MED FILL — FUROSEMIDE 40 MG TAB: 40 mg | ORAL | Qty: 1

## 2016-01-26 MED FILL — LISINOPRIL 20 MG TAB: 20 mg | ORAL | Qty: 2

## 2016-01-26 MED FILL — BD POSIFLUSH NORMAL SALINE 0.9 % INJECTION SYRINGE: INTRAMUSCULAR | Qty: 30

## 2016-01-26 NOTE — Other (Signed)
C/p Rehab note- chart reviewed. Patient is on CHF bundle list  ????  ????  Adm with Acute on chronic hypoxic & hypercapneic??respiratory failure POA -   Likely due to Mild acute COPD exacerbation POA??  Acute on Chronic diastolic dysfunction HF EF 60% POA.  Echo on 01/25/16 with EF of 70%.    ????  HX includes HTN,T2DM,Hypercholesterolemia,COPD,Home oxygen, CHF. Nonsmoker.     This was a follow-up visit to answer questions and reinforce prior teaching re: CHF, S&Ss, medication management, Low NA diet, daily weights, when to call the doctor and balancing rest/activity.    Patient states she does weigh daily and knows to call MD if weight increases 2-3 lb in a day or 5 lb in a week.    Her brother does a lot of the cooking and does watch use of salt.  She does chair exercises at home.      She has no questions currently.  Will continue to follow.      ??

## 2016-01-26 NOTE — Progress Notes (Signed)
Hospitalist Progress Note    NAME: Brittney Shelton   DOB:  13-Feb-1951   MRN:  [id number]       Assessment / Plan:  Acute on chronic combined hypoxic/hypercapneic respiratory failure due to??acute diastolic CHF with pulmonary edema and acute COPD exacerbation in setting of morbid obesity: ??Echo 4/17 with EF 60-65%. There were no regional wall motion abnormalities. Wall thickness was mildly increased.  Baseline 4-5 LPM.  - CXR on admission with mild pulmonary edema in the presumed setting of heart failure  - con't IV lasix (on po at home) with fluid restriction  - strict I/Os.  Pt getting bed weights, so not accurate.  - con't prednisone, tapering  - con't mucinex.  - con't nebs prn. ??Started incruse this admission.  - pulmonology and cardiology consults appreciated, will have close f/u in the office next week.  Hypertension, benign/essential:  - con't norvasc, clonidine, isordil, metoprolol, lisinopril  - con't lasix at increase dose IV as above  - prn hydralazine  Non insulin dependent DM2 controlled:  Hypoglycemia resolved with medication adjustment.  - d/c home amaryl  - con't glucophage alone  - lispro sliding scale??  Hyperlipidemia:??con't pravachol??  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  ????  Code Status: DNR  Surrogate Decision Maker: brother Molinda Bailiffrnest Smith??  DVT Prophylaxis:??lovenox      Subjective:     Chief Complaint / Reason for Physician Visit  "I'm feeling a little better".  Nearing baseline. Discussed with RN events overnight.     Review of Systems:  Symptom Y/N Comments  Symptom Y/N Comments   Fever/Chills n   Chest Pain n    Poor Appetite n   Edema y Lymphedema, chronic   Cough n   Abdominal Pain n    Sputum n   Joint Pain     SOB/DOE n   Pruritis/Rash     Nausea/vomit    Tolerating PT/OT     Diarrhea    Tolerating Diet     Constipation    Other       Could NOT obtain due to:      Objective:     VITALS:   Last 24hrs VS reviewed since prior progress note. Most recent are:   Patient Vitals for the past 24 hrs:   Temp Pulse Resp BP SpO2   01/26/16 1110 98 ??F (36.7 ??C) (!) 59 18 125/57 93 %   01/26/16 0759 97.8 ??F (36.6 ??C) (!) 58 18 151/62 92 %   01/26/16 0405 97.6 ??F (36.4 ??C) (!) 58 20 137/51 93 %   01/25/16 2330 98 ??F (36.7 ??C) 61 19 112/44 93 %   01/25/16 1949 97.2 ??F (36.2 ??C) 61 20 142/63 91 %   01/25/16 1550 98 ??F (36.7 ??C) 63 22 141/80 96 %       Intake/Output Summary (Last 24 hours) at 01/26/16 1209  Last data filed at 01/26/16 1126   Gross per 24 hour   Intake              840 ml   Output             1900 ml   Net            -1060 ml        PHYSICAL EXAM:  General: Obese Alert, cooperative, no acute distress????  EENT:  EOMI. Anicteric sclerae. MMM  Resp:  CTA bilaterally, no wheezing or rales.  Fair air movement. No accessory muscle use  CV:  Regular rhythm,?? No edema  GI:  Soft, moderately distended, Non tender. ??+Bowel sounds  Neurologic:?? Alert and oriented X 3, normal speech,   Psych:???? Fair insight.??Not anxious nor agitated  Skin:  No rashes.  No jaundice    Reviewed most current lab test results and cultures  YES  Reviewed most current radiology test results   YES  Review and summation of old records today    NO  Reviewed patient's current orders and MAR    YES  PMH/SH reviewed - no change compared to H&P  ________________________________________________________________________  Care Plan discussed with:    Comments   Patient x    Family      RN x    Care Manager     Consultant                        Multidiciplinary team rounds were held today with case manager, nursing, pharmacist and Higher education careers adviser.  Patient's plan of care was discussed; medications were reviewed and discharge planning was addressed.     ________________________________________________________________________  Total NON critical care TIME:  25 Minutes    Total CRITICAL CARE TIME Spent:   Minutes non procedure based      Comments   >50% of visit spent in counseling and coordination of care x     ________________________________________________________________________  Fredirick Lathe, MD     Procedures: see electronic medical records for all procedures/Xrays and details which were not copied into this note but were reviewed prior to creation of Plan.      LABS:  I reviewed today's most current labs and imaging studies.  Pertinent labs include:  Recent Labs      01/26/16   0457  01/25/16   0217  01/24/16   0308   WBC  8.9  9.1  7.2   HGB  11.2*  11.8  11.5   HCT  36.1  36.3  35.4   PLT  195  192  178     Recent Labs      01/26/16   0457  01/25/16   0217  01/24/16   0416  01/23/16   1225   NA  136  134*  135*  138   K  3.9  4.7  4.0  3.3*   CL  95*  97  94*  94*   CO2  37*  31  36*  41*   GLU  72  124*  170*  128*   BUN  32*  30*  23*  18   CREA  0.92  1.11*  1.04*  0.94   CA  7.8*  7.9*  8.1*  8.2*   MG   --   1.9  1.8  1.7   ALB   --    --    --   3.4*   TBILI   --    --    --   1.5*   SGOT   --    --    --   10*   ALT   --    --    --   10*       Signed: Fredirick Lathe, MD

## 2016-01-26 NOTE — Progress Notes (Signed)
Urine cx positive for MRSA. ? Sent from ED. He is afebrile, normal WBC. Will ask hospitalist input.

## 2016-01-26 NOTE — Progress Notes (Signed)
Cardiology Progress Note      01/26/2016 10:03 AM    Admit Date: 01/23/2016    Admit Diagnosis: Acute on chronic respiratory failure with hypoxia and hyper*      Subjective:     Brittney Shelton denies any specific complaints. SOB at baseline. Echo showed normal LVEF.    Visit Vitals   ??? BP 151/62 (BP 1 Location: Left arm, BP Patient Position: At rest)   ??? Pulse (!) 58  Comment: notified Ecologist    ??? Temp 97.8 ??F (36.6 ??C)   ??? Resp 18   ??? Ht 5\' 6"  (1.676 m)   ??? Wt 327 lb 6.1 oz (148.5 kg)   ??? SpO2 92%   ??? BMI 52.84 kg/m2       Current Facility-Administered Medications   Medication Dose Route Frequency   ??? predniSONE (DELTASONE) tablet 20 mg  20 mg Oral DAILY WITH BREAKFAST   ??? furosemide (LASIX) injection 40 mg  40 mg IntraVENous ACB&D   ??? metFORMIN (GLUCOPHAGE) tablet 500 mg  500 mg Oral BID WITH MEALS   ??? albuterol-ipratropium (DUO-NEB) 2.5 MG-0.5 MG/3 ML  3 mL Nebulization Q6H PRN   ??? umeclidinium (INCRUSE ELLIPTA) 62.5 mcg/actuation  1 Puff Inhalation DAILY   ??? acetaminophen (TYLENOL) tablet 650 mg  650 mg Oral Q4H PRN   ??? ondansetron (ZOFRAN) injection 4 mg  4 mg IntraVENous Q6H PRN   ??? hydrALAZINE (APRESOLINE) 20 mg/mL injection 10 mg  10 mg IntraVENous Q6H PRN   ??? amLODIPine (NORVASC) tablet 10 mg  10 mg Oral DAILY   ??? aspirin chewable tablet 81 mg  81 mg Oral DAILY   ??? cholecalciferol (VITAMIN D3) tablet 5,000 Units  5,000 Units Oral DAILY   ??? isosorbide dinitrate (ISORDIL) tablet 30 mg  30 mg Oral TID   ??? lisinopril (PRINIVIL, ZESTRIL) tablet 40 mg  40 mg Oral DAILY   ??? metoprolol succinate (TOPROL-XL) XL tablet 200 mg  200 mg Oral DAILY   ??? pravastatin (PRAVACHOL) tablet 20 mg  20 mg Oral QHS   ??? guaiFENesin ER (MUCINEX) tablet 600 mg  600 mg Oral Q12H   ??? insulin lispro (HUMALOG) injection   SubCUTAneous AC&HS   ??? glucose chewable tablet 16 g  4 Tab Oral PRN   ??? glucagon (GLUCAGEN) injection 1 mg  1 mg IntraMUSCular PRN   ??? sodium chloride (NS) flush 5-10 mL  5-10 mL IntraVENous Q8H    ??? sodium chloride (NS) flush 5-10 mL  5-10 mL IntraVENous PRN   ??? enoxaparin (LOVENOX) injection 40 mg  40 mg SubCUTAneous Q12H   ??? dextrose 10% infusion 125-250 mL  125-250 mL IntraVENous PRN         Objective:      Physical Exam:  Visit Vitals   ??? BP 151/62 (BP 1 Location: Left arm, BP Patient Position: At rest)   ??? Pulse (!) 58  Comment: notified Ecologist    ??? Temp 97.8 ??F (36.6 ??C)   ??? Resp 18   ??? Ht 5\' 6"  (1.676 m)   ??? Wt 327 lb 6.1 oz (148.5 kg)   ??? SpO2 92%   ??? BMI 52.84 kg/m2     General Appearance:  Well developed, well nourished,alert and oriented x 3, and individual in no acute distress.   Ears/Nose/Mouth/Throat:   Hearing grossly normal.         Neck: Supple.   Chest:   Lungs clear to auscultation bilaterally.   Cardiovascular:  Regular rate and rhythm, S1, S2 normal, no murmur.   Abdomen:   Soft, non-tender, bowel sounds are active.   Extremities: 1+ edema bilaterally.    Skin: Warm and dry.                 Data Review:   Labs:  Recent Results (from the past 24 hour(s))   GLUCOSE, POC    Collection Time: 01/25/16 11:35 AM   Result Value Ref Range    Glucose (POC) 50 (L) 65 - 100 mg/dL    Performed by Cora Collum (PCT)    GLUCOSE, POC    Collection Time: 01/25/16 11:38 AM   Result Value Ref Range    Glucose (POC) 56 (L) 65 - 100 mg/dL    Performed by Cora Collum (PCT)    GLUCOSE, POC    Collection Time: 01/25/16 11:58 AM   Result Value Ref Range    Glucose (POC) 51 (L) 65 - 100 mg/dL    Performed by Ernesta Amble    GLUCOSE, POC    Collection Time: 01/25/16 12:22 PM   Result Value Ref Range    Glucose (POC) 75 65 - 100 mg/dL    Performed by Ernesta Amble    GLUCOSE, POC    Collection Time: 01/25/16 12:46 PM   Result Value Ref Range    Glucose (POC) 111 (H) 65 - 100 mg/dL    Performed by Ernesta Amble    GLUCOSE, POC    Collection Time: 01/25/16  4:52 PM   Result Value Ref Range    Glucose (POC) 108 (H) 65 - 100 mg/dL    Performed by Cora Collum (PCT)    GLUCOSE, POC    Collection Time: 01/25/16  8:30 PM    Result Value Ref Range    Glucose (POC) 159 (H) 65 - 100 mg/dL    Performed by Hamp Madeline (PCT)    METABOLIC PANEL, BASIC    Collection Time: 01/26/16  4:57 AM   Result Value Ref Range    Sodium 136 136 - 145 mmol/L    Potassium 3.9 3.5 - 5.1 mmol/L    Chloride 95 (L) 97 - 108 mmol/L    CO2 37 (H) 21 - 32 mmol/L    Anion gap 4 (L) 5 - 15 mmol/L    Glucose 72 65 - 100 mg/dL    BUN 32 (H) 6 - 20 MG/DL    Creatinine 1.61 0.96 - 1.02 MG/DL    BUN/Creatinine ratio 35 (H) 12 - 20      GFR est AA >60 >60 ml/min/1.82m2    GFR est non-AA >60 >60 ml/min/1.67m2    Calcium 7.8 (L) 8.5 - 10.1 MG/DL   CBC W/O DIFF    Collection Time: 01/26/16  4:57 AM   Result Value Ref Range    WBC 8.9 3.6 - 11.0 K/uL    RBC 4.02 3.80 - 5.20 M/uL    HGB 11.2 (L) 11.5 - 16.0 g/dL    HCT 04.5 40.9 - 81.1 %    MCV 89.8 80.0 - 99.0 FL    MCH 27.9 26.0 - 34.0 PG    MCHC 31.0 30.0 - 36.5 g/dL    RDW 91.4 (H) 78.2 - 14.5 %    PLATELET 195 150 - 400 K/uL   GLUCOSE, POC    Collection Time: 01/26/16  8:02 AM   Result Value Ref Range    Glucose (POC) 69 65 - 100 mg/dL    Performed  by Jae DireSwindells Tammy    GLUCOSE, POC    Collection Time: 01/26/16  8:32 AM   Result Value Ref Range    Glucose (POC) 77 65 - 100 mg/dL    Performed by Ernesta AmbleGoode Sarah    GLUCOSE, POC    Collection Time: 01/26/16  9:14 AM   Result Value Ref Range    Glucose (POC) 123 (H) 65 - 100 mg/dL    Performed by Ernesta AmbleGoode Sarah        Telemetry: normal sinus rhythm      Assessment:     Active Problems:    Acute on chronic respiratory failure with hypoxia and hypercapnia (HCC) (01/23/2016)      Acute on chronic diastolic (congestive) heart failure (HCC) (01/23/2016)        Plan:     CHF improving.    Switch to oral diuretic.    F/u with Dr. Orvil FeilKaminsky.    Pind[place] Beckie Saltsavindra V, MD

## 2016-01-26 NOTE — Progress Notes (Signed)
Report received from [place], [place]. SBAR, Kardex, Procedure Summary, Intake/Output, MAR, Accordion and Recent Results were discussed.    Brittney Shelton

## 2016-01-27 LAB — METABOLIC PANEL, BASIC
Anion gap: 5 mmol/L (ref 5–15)
BUN/Creatinine ratio: 30 — ABNORMAL HIGH (ref 12–20)
BUN: 27 MG/DL — ABNORMAL HIGH (ref 6–20)
CO2: 36 mmol/L — ABNORMAL HIGH (ref 21–32)
Calcium: 8.5 MG/DL (ref 8.5–10.1)
Chloride: 95 mmol/L — ABNORMAL LOW (ref 97–108)
Creatinine: 0.89 MG/DL (ref 0.55–1.02)
GFR est AA: >60 mL/min/{1.73_m2} (ref >60)
GFR est non-AA: >60 mL/min/{1.73_m2} (ref >60)
Glucose: 79 mg/dL (ref 65–100)
Potassium: 3.9 mmol/L (ref 3.5–5.1)
Sodium: 136 mmol/L (ref 136–145)

## 2016-01-27 LAB — GLUCOSE, POC
Glucose (POC): 173 mg/dL — ABNORMAL HIGH (ref 65–100)
Glucose (POC): 79 mg/dL (ref 65–100)
Glucose (POC): 89 mg/dL (ref 65–100)
Glucose (POC): 106 mg/dL — ABNORMAL HIGH (ref 65–100)

## 2016-01-27 MED ORDER — PREDNISONE 10 MG TAB
10 mg | ORAL_TABLET | Freq: Every day | ORAL | 0 refills | Status: DC
Start: 2016-01-27 — End: 2016-01-30

## 2016-01-27 MED ORDER — TIOTROPIUM BROMIDE 18 MCG CAPS WITH INHALATION DEVICE
18 mcg | ORAL_CAPSULE | Freq: Every day | RESPIRATORY_TRACT | 0 refills | Status: DC
Start: 2016-01-27 — End: 2016-01-30

## 2016-01-27 MED ORDER — METFORMIN 500 MG TAB
500 mg | ORAL_TABLET | Freq: Two times a day (BID) | ORAL | 0 refills | Status: AC
Start: 2016-01-27 — End: 2016-02-26

## 2016-01-27 MED FILL — BD POSIFLUSH NORMAL SALINE 0.9 % INJECTION SYRINGE: INTRAMUSCULAR | Qty: 10

## 2016-01-27 MED FILL — METFORMIN 500 MG TAB: 500 mg | ORAL | Qty: 1

## 2016-01-27 MED FILL — PREDNISONE 20 MG TAB: 20 mg | ORAL | Qty: 1

## 2016-01-27 MED FILL — CHILDREN'S ASPIRIN 81 MG CHEWABLE TABLET: 81 mg | ORAL | Qty: 1

## 2016-01-27 MED FILL — LISINOPRIL 20 MG TAB: 20 mg | ORAL | Qty: 2

## 2016-01-27 MED FILL — FUROSEMIDE 40 MG TAB: 40 mg | ORAL | Qty: 1

## 2016-01-27 MED FILL — PRAVASTATIN 10 MG TAB: 10 mg | ORAL | Qty: 2

## 2016-01-27 MED FILL — ISOSORBIDE DINITRATE 20 MG TAB: 20 mg | ORAL | Qty: 2

## 2016-01-27 MED FILL — LOVENOX 40 MG/0.4 ML SUBCUTANEOUS SYRINGE: 40 mg/0.4 mL | SUBCUTANEOUS | Qty: 0.4

## 2016-01-27 MED FILL — MUCINEX 600 MG TABLET, EXTENDED RELEASE: 600 mg | ORAL | Qty: 1

## 2016-01-27 MED FILL — TOPROL XL 50 MG TABLET,EXTENDED RELEASE: 50 mg | ORAL | Qty: 4

## 2016-01-27 MED FILL — VITAMIN D3 25 MCG (1,000 UNIT) TABLET: 25 mcg (1,000 unit) | ORAL | Qty: 5

## 2016-01-27 MED FILL — AMLODIPINE 5 MG TAB: 5 mg | ORAL | Qty: 2

## 2016-01-27 NOTE — Home Health (Signed)
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    Date of Face to Face:  01/27/2016 3:30 PM                                  Face to Face Encounter findings are related to primary reason for home care:   YES    1. I certify that the patient needs intermittent skilled nursing care, physical therapy and/or speech therapy.  I will not be following this patient in the Community and Dr.  Erenest RasherNau D Domah, MD will be responsible for signing the Home Health Plan of Care.    2. Initial Orders for Care: Skilled Nursing and Physical Therapy    3. I certify that this patient is homebound because of illness or injury, need the aid of supportive devices such as crutches, canes, wheelchairs, and walkers; the use of special transportation; or the assistance of another person in order to leave their place of residence.  There exists a normal inability to leave home and leaving home requires a considerable and taxing effort.     4. I certify that this patient is under my care and that I had a Face-to-Face Encounter that meets the physician Face-to-Face Encounter requirements.  Document the physical findings from the Face-to-Face Encounter that support the need for skilled services: Has new diagnosis that requires skilled nursing teaching and intervention , Has new medications that requires skilled nursing teaching and monitoring for understanding and compliance  and Has new finding of weakness and altered mobility that requires skilled physical/occupational and/or speech therapy services for  evaluation and interventions.     Fredirick LatheAnn M Kelvyn Schunk, MD  Discharging Physician  Office: [phone]  Fax:   [phone]

## 2016-01-27 NOTE — Progress Notes (Addendum)
As per [name] request CM send [name] order for SN/PT through [facility].Provider info updated to pt AVS.Pt was open with [name] .Pt PCP office is closed today to schedule a follow up appointment. [facility] given .  Brittney Shelton  Ext [phone]

## 2016-01-27 NOTE — Progress Notes (Signed)
Cardiology Progress Note      01/27/2016 10:15 AM    Admit Date: 01/23/2016    Admit Diagnosis: Acute on chronic respiratory failure with hypoxia and hyper*      Subjective:     Brittney Shelton is at her baseline has chronic non pitting edema.    Visit Vitals   ??? BP 167/56 (BP 1 Location: Left arm, BP Patient Position: At rest;Sitting)   ??? Pulse 63   ??? Temp 97.7 ??F (36.5 ??C)   ??? Resp 18   ??? Ht 5\' 6"  (1.676 m)   ??? Wt 313 lb 11.4 oz (142.3 kg)   ??? SpO2 93%   ??? BMI 50.63 kg/m2       Current Facility-Administered Medications   Medication Dose Route Frequency   ??? furosemide (LASIX) tablet 40 mg  40 mg Oral ACB&D   ??? predniSONE (DELTASONE) tablet 20 mg  20 mg Oral DAILY WITH BREAKFAST   ??? metFORMIN (GLUCOPHAGE) tablet 500 mg  500 mg Oral BID WITH MEALS   ??? albuterol-ipratropium (DUO-NEB) 2.5 MG-0.5 MG/3 ML  3 mL Nebulization Q6H PRN   ??? umeclidinium (INCRUSE ELLIPTA) 62.5 mcg/actuation  1 Puff Inhalation DAILY   ??? acetaminophen (TYLENOL) tablet 650 mg  650 mg Oral Q4H PRN   ??? ondansetron (ZOFRAN) injection 4 mg  4 mg IntraVENous Q6H PRN   ??? hydrALAZINE (APRESOLINE) 20 mg/mL injection 10 mg  10 mg IntraVENous Q6H PRN   ??? amLODIPine (NORVASC) tablet 10 mg  10 mg Oral DAILY   ??? aspirin chewable tablet 81 mg  81 mg Oral DAILY   ??? cholecalciferol (VITAMIN D3) tablet 5,000 Units  5,000 Units Oral DAILY   ??? isosorbide dinitrate (ISORDIL) tablet 30 mg  30 mg Oral TID   ??? lisinopril (PRINIVIL, ZESTRIL) tablet 40 mg  40 mg Oral DAILY   ??? metoprolol succinate (TOPROL-XL) XL tablet 200 mg  200 mg Oral DAILY   ??? pravastatin (PRAVACHOL) tablet 20 mg  20 mg Oral QHS   ??? guaiFENesin ER (MUCINEX) tablet 600 mg  600 mg Oral Q12H   ??? insulin lispro (HUMALOG) injection   SubCUTAneous AC&HS   ??? glucose chewable tablet 16 g  4 Tab Oral PRN   ??? glucagon (GLUCAGEN) injection 1 mg  1 mg IntraMUSCular PRN   ??? sodium chloride (NS) flush 5-10 mL  5-10 mL IntraVENous Q8H   ??? sodium chloride (NS) flush 5-10 mL  5-10 mL IntraVENous PRN    ??? enoxaparin (LOVENOX) injection 40 mg  40 mg SubCUTAneous Q12H   ??? dextrose 10% infusion 125-250 mL  125-250 mL IntraVENous PRN         Objective:      Physical Exam:  Visit Vitals   ??? BP 167/56 (BP 1 Location: Left arm, BP Patient Position: At rest;Sitting)   ??? Pulse 63   ??? Temp 97.7 ??F (36.5 ??C)   ??? Resp 18   ??? Ht 5\' 6"  (1.676 m)   ??? Wt 313 lb 11.4 oz (142.3 kg)   ??? SpO2 93%   ??? BMI 50.63 kg/m2     General Appearance:  Well developed, well nourished,alert and oriented x 3, and individual in no acute distress.   Ears/Nose/Mouth/Throat:   Hearing grossly normal.         Neck: Supple.   Chest:   Lungs clear to auscultation bilaterally.   Cardiovascular:  Regular rate and rhythm, S1, S2 normal, no murmur.   Abdomen:   Soft,  non-tender, bowel sounds are active.   Extremities: trace edema bilaterally.    Skin: Warm and dry.                 Data Review:   Labs:  Recent Results (from the past 24 hour(s))   GLUCOSE, POC    Collection Time: 01/26/16 11:14 AM   Result Value Ref Range    Glucose (POC) 142 (H) 65 - 100 mg/dL    Performed by Swindells Tammy    GLUCOSE, POC    Collection Time: 01/26/16  4:40 PM   Result Value Ref Range    Glucose (POC) 207 (H) 65 - 100 mg/dL    Performed by Swindells Tammy    GLUCOSE, POC    Collection Time: 01/26/16  9:11 PM   Result Value Ref Range    Glucose (POC) 173 (H) 65 - 100 mg/dL    Performed by Raleigh CallasBullock Andrenia    METABOLIC PANEL, BASIC    Collection Time: 01/27/16  3:59 AM   Result Value Ref Range    Sodium 136 136 - 145 mmol/L    Potassium 3.9 3.5 - 5.1 mmol/L    Chloride 95 (L) 97 - 108 mmol/L    CO2 36 (H) 21 - 32 mmol/L    Anion gap 5 5 - 15 mmol/L    Glucose 79 65 - 100 mg/dL    BUN 27 (H) 6 - 20 MG/DL    Creatinine 1.610.89 0.960.55 - 1.02 MG/DL    BUN/Creatinine ratio 30 (H) 12 - 20      GFR est AA >60 >60 ml/min/1.5273m2    GFR est non-AA >60 >60 ml/min/1.5373m2    Calcium 8.5 8.5 - 10.1 MG/DL   GLUCOSE, POC    Collection Time: 01/27/16  7:36 AM   Result Value Ref Range     Glucose (POC) 79 65 - 100 mg/dL    Performed by Dalene CarrowGibertson  Carolyn (PCT)    GLUCOSE, POC    Collection Time: 01/27/16  7:52 AM   Result Value Ref Range    Glucose (POC) 89 65 - 100 mg/dL    Performed by Dalene CarrowGibertson  Carolyn (PCT)        Telemetry: normal sinus rhythm      Assessment:     Active Problems:    Acute on chronic respiratory failure with hypoxia and hypercapnia (HCC) (01/23/2016)      Acute on chronic diastolic (congestive) heart failure (HCC) (01/23/2016)        Plan:     Stable for d/c from cardiac stand point.    Continue oral lasix at 40 BID. May benefit from compression stockings.    Has appt. With Dr. Orvil FeilKaminsky Tuesday.    [place] Beckie Saltsavindra V, MD

## 2016-01-27 NOTE — Progress Notes (Signed)
Discussed with patient discharge instructions related to CHF and measuring fluid intake. Patient understands the green, yellow, red chart for CHF. She will monitor her sodium intake. Patient is discharged. She has 02 to go home with.

## 2016-01-27 NOTE — Discharge Summary (Signed)
Hospitalist Discharge Summary     Patient ID:  Brittney Shelton  [id number]  65 y.o.  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      CONSULTATIONS:  IP CONSULT TO PULMONOLOGY  IP CONSULT TO CARDIOLOGY    Excerpted HPI from H&P of Brittney Savoy, MD:  Brittney Shelton is a 66 y.o.  Caucasian female who presents with above complains from home ambulatory.  Pt presents with CC of Worsening SOB x past 1 week  H/o associated Cough (dry) with wheezing   H/o chronically being on home oxygen at 4L/m at all times from COPD- sees Dr Jayme Cloud  Denies h/o orthopnea, PND  H/o associated chronic LE edema  Denies dietary indiscretion , increased fluid intake  Pt on Lasix 40 mg BID as instructed by her PCP  ??  Pt was found to have Mild Pulm Edema on CXR in ER , but wheezing on exam in ER, hypoxia requiring Venti mask on arrival.- pt feeling much better after the Lasix given in ER.    ______________________________________________________________________  DISCHARGE SUMMARY/HOSPITAL COURSE:  for full details see H&P, daily progress notes, labs, consult notes.     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 There were no regional wall motion abnormalities. Wall thickness was mildly increased. ??Baseline 4-5 LPM.  Pt had CXR on admission with mild pulmonary edema in the presumed setting of heart failure.  She was treated with IV lasix for  diuresis while here (on po at home) with fluid restriction.  She was also placed on a prednisone taper.  Pt was started on spiriva this admission.  She was seen in consultation by pulmonology and cardiology, will have close f/u in the office next week.  She has been given written instructions on daily weights and was seen by the cardiac nurse while here.  Hypertension, benign/essential:  con't norvasc, clonidine, isordil, metoprolol, lisinopril.  Lasix 40mg  BID on discharge as recommended by cardiology.    Non insulin dependent DM2 controlled: ??Hypoglycemia resolved with medication adjustment.  D/c home amaryl.  Con't glucophage alone.??  Hyperlipidemia:??con't pravachol??  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    _______________________________________________________________________  Patient seen and examined by me on discharge day.  Pertinent Findings:  Gen: obese, awake, appropriate, NAD  HEENT: cl oro, no lesions  Chest: CTA bilaterally, no crackles or wheezes  Cv: RRR, no murmur, lymphedema  Abd: soft, NT, moderately distended, BS+, no mass  Neuro: CN intact  _______________________________________________________________________  DISCHARGE MEDICATIONS:   Current Discharge Medication List      START taking these medications    Details   predniSONE (DELTASONE) 10 mg tablet Take 1 Tab by mouth daily (with breakfast) for 3 days.  Qty: 3 Tab, Refills: 0      tiotropium (SPIRIVA WITH HANDIHALER) 18 mcg inhalation capsule Take 1 Cap by inhalation daily for 30 days.  Qty: 30 Cap, Refills: 0         CONTINUE these medications which have CHANGED  Details   metFORMIN (GLUCOPHAGE) 500 mg tablet Take 1 Tab by mouth two (2) times daily (with meals) for 30 days. Take 1 tablet with dinner daily.  If glucose >150, take 1 tablet twice daily.  Qty: 60 Tab, Refills: 0         CONTINUE these medications which have NOT CHANGED    Details   naproxen sodium (ALEVE) 220 mg tablet Take 440 mg by mouth every twelve  (12) hours as needed for Pain.      furosemide (LASIX) 40 mg tablet TAKE 1 TABLET BY MOUTH TWICE DAILY FOR SWELLING  Qty: 180 Tab, Refills: 0    Associated Diagnoses: Chronic acquired lymphedema      cholecalciferol, VITAMIN D3, (VITAMIN D3) 5,000 unit tab tablet Take 1 Tab by mouth daily.  Qty: 30 Tab, Refills: 5    Associated Diagnoses: Hypovitaminosis D      pravastatin (PRAVACHOL) 20 mg tablet TAKE 1 TABLET BY MOUTH ONCE DAILY AT NIGHT  Qty: 90 Tab, Refills: 5    Comments: **Patient requests 90 days supply**  Associated Diagnoses: Hypercholesterolemia      amLODIPine (NORVASC) 10 mg tablet TAKE 1 TABLET BY MOUTH DAILY  Qty: 90 Tab, Refills: 3    Associated Diagnoses: Hypertension, uncontrolled      albuterol-ipratropium (DUO-NEB) 2.5 mg-0.5 mg/3 ml nebu Take 3 mL by inhalation three (3) times daily.      albuterol (PROVENTIL HFA, VENTOLIN HFA, PROAIR HFA) 90 mcg/actuation inhaler Take 1 Puff by inhalation every six (6) hours as needed for Wheezing.  Qty: 1 Inhaler, Refills: 0      metoprolol succinate (TOPROL-XL) 200 mg XL tablet TAKE 1 TABLET BY MOUTH DAILY FOR HIGH BLOOD PRESSURE  Qty: 90 Tab, Refills: 5    Comments: **Patient requests 90 days supply**  Associated Diagnoses: Hypertension, uncontrolled      lisinopril (PRINIVIL, ZESTRIL) 40 mg tablet TAKE 1 TABLET BY MOUTH ONCE DAILY  Qty: 90 Tab, Refills: 5    Comments: **Patient requests 90 days supply**  Associated Diagnoses: Hypertension, uncontrolled      isosorbide dinitrate (ISORDIL) 30 mg tablet TAKE 1 TABLET BY MOUTH THREE TIMES DAILY  Qty: 270 Tab, Refills: 5    Comments: **Patient requests 90 days supply**  Associated Diagnoses: Hypertension, uncontrolled      aspirin 81 mg chewable tablet Take 81 mg by mouth daily.         STOP taking these medications       glimepiride (AMARYL) 4 mg tablet Comments:   Reason for Stopping:         cloNIDine HCl (CATAPRES) 0.1 mg tablet Comments:   Reason for Stopping:          cholecalciferol (VITAMIN D3) 5,000 unit capsule Comments:   Reason for Stopping:         glimepiride (AMARYL) 4 mg tablet Comments:   Reason for Stopping:               My Recommended Diet, Activity, Wound Care, and follow-up labs are listed in the patient's Discharge Insturctions which I have personally completed and reviewed.    _______________________________________________________________________  DISPOSITION:    Home with Family: x   Home with HH/PT/OT/RN:    SNF/LTC:    SAHR:    OTHER:        Condition at Discharge:  Stable  _______________________________________________________________________  Follow up with:   PCP : Erenest RasherNau D Domah, MD  Follow-up Information     Follow up With Details  Comments Contact Info    Vickki Muff, MD On 01/29/2016 Time 10:45 AM [address]  [facility]  [address]  [place] [address]  [phone]      [facility]   [address] [place] [address]  [phone]    Victory Dakin, MD Go on 01/28/2016 Cardiology follow up at 9:15 AM  [address]  [place] [address]  [phone]      Erenest Rasher, MD In 2 weeks  [address]  [address]  [place] [address]  [phone]                Total time in minutes spent coordinating this discharge (includes going over instructions, follow-up, prescriptions, and preparing report for sign off to her PCP) :  35 minutes    Signed:  Fredirick Lathe, MD

## 2016-01-28 ENCOUNTER — Encounter: Admit: 2016-01-28 | Discharge: 2016-01-28 | Payer: MEDICARE | Primary: Internal Medicine

## 2016-01-28 ENCOUNTER — Encounter: Attending: Cardiovascular Disease | Primary: Internal Medicine

## 2016-01-28 NOTE — Progress Notes (Signed)
Spoke with patient after verifying name and DOB. This Clinical research associate inquired why patient missed her appointment with cardiology today. She stated she does not have transportation on Mondays. Patient reports feeling well this morning. Verified [employer] came and check concentrator and changed filter. Weight this morning is 312 pounds, She is restricting fluids and salt. She is scheduled to be seen by home health today, 01/28/16. This Clinical research associate reminded patient of pulmonary appointment 01/29/16.  Unable to perform medication reconciliation at this time due to patient asking to call this writer back. She has someone there now. Patient has this Tree surgeon. This Clinical research associate will follow up with patient if no return call received.

## 2016-01-28 NOTE — Progress Notes (Signed)
Patient discharges from hospital on 01/27/16 for acute on chronic combined hypoxic/hypercapneic respiratory failure due to??acute diastolic CHF with pulmonary edema and acute COPD exacerbation. Attempted to contact patient to complete post hospital follow up unable to reach patient.This Clinical research associate left message on voicemail with contact information. Will attempt to contact again if no return call received. Need to complete post-discharge assessment.       Echo 01/25/16 EF~70%  Future Appointments  Date Time Provider Department Center   01/28/2016 9:15 AM Victory Dakin, MD [facility] [place] SCHED   01/28/2016 To Be Determined Melodie Bouillon, RN [facility] [place] [facility]   01/30/2016 12:15 PM Erenest Rasher, MD [facility] [place] SCHED   03/13/2016 9:45 AM Erenest Rasher, MD [facility] [place] SCHED   04/30/2016 11:30 AM Victory Dakin, MD [facility] [place] SCHED   Patient is also scheduled to see Dr. Jayme Cloud on 01/29/16.

## 2016-01-28 NOTE — Progress Notes (Signed)
Spoke with patient after verifying name and DOB.  Performed medication reconciliation with patient. Patient informed this Clinical research associatewriter she has Spiriva from previous hospitalization but did not get prescription filled due to cost. This Clinical research associatewriter advised patient to let Dr. Jayme CloudGonzalez know medicare does not cover Spiriva at appointment tomorrow so it can be changed to what is covered. Patient is currently wearing oxygen at 4L NC and pulse ox reads 89%. She denies feeling SOB or having to use purse lip breathing.       1425 this writer received message from patient stating her cardiology appointment has been rescheduled to 01/30/16 at 1100 and she will need to reschedule PCP appointment.     [id number] Spoke with patient after verifying name and DOB regarding TOC appointment.  This Clinical research associatewriter stressed the [facility]. Patient agrees to keep current appointment and if unable to make will contact to change. Patient gives phone to Vernona RiegerLaura from home health and she reports to this writer patient does not have Spiriva in home and she has not picked up prednisone from pharmacy. Vernona RiegerLaura advised patient will be discussing Spiriva with Dr. Jayme CloudGonzalez tomorrow at appointment Vernona RiegerLaura questions if interested in telehealth so patient vital signs and weights can be monitored daily. Verbal order given for telehealth. Inform Vernona RiegerLaura of patients upcoming appointments and Vernona RiegerLaura will set up telehelath on Thursday. This Engineer, building serviceswriter contact information provided to Vernona RiegerLaura for further needs or update on patient.       1445 spoke with PCP to discuss patient and she agrees with verbal order for telehealth monitoring for patient.

## 2016-01-29 NOTE — Progress Notes (Signed)
Spoke with patient after verifying name and DOB. This Clinical research associate following up to Dr. Jayme Cloud appointment today and medication change for Spirvia. Patient informed this writer she went to wrong appointment today so did not attend Dr. Jayme Cloud appointment today. She has not rescheduled appointment or spoken with doctor about medication. This Clinical research associate stressed the importance of keeping her follow up appointment. Patient is to call and reschedule Dr. Jayme Cloud appointment and inform him of medicare not paying for [facility]. She verified her two appointments scheduled for tomorrow. Patient reported weight today is 308.Marland Kitchen

## 2016-01-30 ENCOUNTER — Ambulatory Visit: Admit: 2016-01-30 | Discharge: 2016-01-30 | Payer: MEDICARE | Attending: Internal Medicine | Primary: Internal Medicine

## 2016-01-30 ENCOUNTER — Encounter: Primary: Internal Medicine

## 2016-01-30 ENCOUNTER — Ambulatory Visit
Admit: 2016-01-30 | Discharge: 2016-01-30 | Payer: MEDICARE | Attending: Cardiovascular Disease | Primary: Internal Medicine

## 2016-01-30 DIAGNOSIS — J418 Mixed simple and mucopurulent chronic bronchitis: Secondary | ICD-10-CM

## 2016-01-30 DIAGNOSIS — I5032 Chronic diastolic (congestive) heart failure: Secondary | ICD-10-CM

## 2016-01-30 MED ORDER — UMECLIDINIUM 62.5 MCG-VILANTEROL 25 MCG/ACTUATION POWDR FOR INHALATION
Freq: Every day | RESPIRATORY_TRACT | 0 refills | Status: DC
Start: 2016-01-30 — End: 2016-01-30

## 2016-01-30 MED ORDER — UMECLIDINIUM 62.5 MCG-VILANTEROL 25 MCG/ACTUATION POWDR FOR INHALATION
Freq: Every day | RESPIRATORY_TRACT | 5 refills | Status: DC
Start: 2016-01-30 — End: 2017-03-13

## 2016-01-30 NOTE — Progress Notes (Signed)
Chief Complaint   Patient presents with   ??? Cholesterol Problem     6 mo f/u   ??? Hypertension     "   ??? Shortness of Breath     on exertion

## 2016-01-30 NOTE — Patient Instructions (Addendum)
DASH Diet: Care Instructions  Your Care Instructions  The DASH diet is an eating plan that can help lower your blood pressure. DASH stands for Dietary Approaches to Stop Hypertension. Hypertension is high blood pressure.  The DASH diet focuses on eating foods that are high in calcium, potassium, and magnesium. These nutrients can lower blood pressure. The foods that are highest in these nutrients are fruits, vegetables, low-fat dairy products, nuts, seeds, and legumes. But taking calcium, potassium, and magnesium supplements instead of eating foods that are high in those nutrients does not have the same effect. The DASH diet also includes whole grains, fish, and poultry.  The DASH diet is one of several lifestyle changes your doctor may recommend to lower your high blood pressure. Your doctor may also want you to decrease the amount of sodium in your diet. Lowering sodium while following the DASH diet can lower blood pressure even further than just the DASH diet alone.  Follow-up care is a key part of your treatment and safety. Be sure to make and go to all appointments, and call your doctor if you are having problems. It's also a good idea to know your test results and keep a list of the medicines you take.  How can you care for yourself at home?  Following the DASH diet  ?? Eat 4 to 5 servings of fruit each day. A serving is 1 medium-sized piece of fruit, ?? cup chopped or canned fruit, 1/4 cup dried fruit, or 4 ounces (?? cup) of fruit juice. Choose fruit more often than fruit juice.  ?? Eat 4 to 5 servings of vegetables each day. A serving is 1 cup of lettuce or raw leafy vegetables, ?? cup of chopped or cooked vegetables, or 4 ounces (?? cup) of vegetable juice. Choose vegetables more often than vegetable juice.  ?? Get 2 to 3 servings of low-fat and fat-free dairy each day. A serving is 8 ounces of milk, 1 cup of yogurt, or 1 ?? ounces of cheese.   ?? Eat 6 to 8 servings of grains each day. A serving is 1 slice of bread, 1 ounce of dry cereal, or ?? cup of cooked rice, pasta, or cooked cereal. Try to choose whole-grain products as much as possible.  ?? Limit lean meat, poultry, and fish to 2 servings each day. A serving is 3 ounces, about the size of a deck of cards.  ?? Eat 4 to 5 servings of nuts, seeds, and legumes (cooked dried beans, lentils, and split peas) each week. A serving is 1/3 cup of nuts, 2 tablespoons of seeds, or ?? cup of cooked beans or peas.  ?? Limit fats and oils to 2 to 3 servings each day. A serving is 1 teaspoon of vegetable oil or 2 tablespoons of salad dressing.  ?? Limit sweets and added sugars to 5 servings or less a week. A serving is 1 tablespoon jelly or jam, ?? cup sorbet, or 1 cup of lemonade.  ?? Eat less than 2,300 milligrams (mg) of sodium a day. If you limit your sodium to 1,500 mg a day, you can lower your blood pressure even more.  Tips for success  ?? Start small. Do not try to make dramatic changes to your diet all at once. You might feel that you are missing out on your favorite foods and then be more likely to not follow the plan. Make small changes, and stick with them. Once those changes become habit, add a few more   changes.  ?? Try some of the following:  ?? Make it a goal to eat a fruit or vegetable at every meal and at snacks. This will make it easy to get the recommended amount of fruits and vegetables each day.  ?? Try yogurt topped with fruit and nuts for a snack or healthy dessert.  ?? Add lettuce, tomato, cucumber, and onion to sandwiches.  ?? Combine a ready-made pizza crust with low-fat mozzarella cheese and lots of vegetable toppings. Try using tomatoes, squash, spinach, broccoli, carrots, cauliflower, and onions.  ?? Have a variety of cut-up vegetables with a low-fat dip as an appetizer instead of chips and dip.  ?? Sprinkle sunflower seeds or chopped almonds over salads. Or try adding  chopped walnuts or almonds to cooked vegetables.  ?? Try some vegetarian meals using beans and peas. Add garbanzo or kidney beans to salads. Make burritos and tacos with mashed pinto beans or black beans.  Where can you learn more?  Go to http://www.healthwise.net/GoodHelpConnections.  Enter H967 in the search box to learn more about "DASH Diet: Care Instructions."  Current as of: September 03, 2015  Content Version: 11.3  ?? 2006-2017 [employer]. Care instructions adapted under license by [employer] (which disclaims liability or warranty for this information). If you have questions about a medical condition or this instruction, always ask your healthcare professional. Healthwise, Incorporated disclaims any warranty or liability for your use of this information.

## 2016-01-30 NOTE — Progress Notes (Signed)
Abigail Miyamoto, MD          NAME:  KAYSHA PARSELL   DOB:   01-17-1951   MRN:   [id number]   PCP:  Erenest Rasher, MD    ??????    Subjective:     The patient is a 65 y.o. year old female  who returns for a routine follow-up. Since the last visit, patient reports no change in exercise tolerance, chest pain, edema, medication intolerance, palpitations, shortness of breath, PND/orthopnea wheezing, sputum, syncope, dizziness or light headedness. Doing well.    Past Medical History:   Diagnosis Date   ??? Chronic obstructive pulmonary disease (HCC)    ??? Congestive heart failure (HCC)    ??? Diabetes (HCC)    ??? Hypertension         ICD-10-CM ICD-9-CM    1. Diastolic CHF, chronic (HCC) I50.32 428.32      428.0    2. Hypercholesterolemia E78.00 272.0 AMB POC EKG ROUTINE W/ 12 LEADS, INTER & REP      Social History   Substance Use Topics   ??? Smoking status: Never Smoker   ??? Smokeless tobacco: Never Used   ??? Alcohol use No      Family History   Problem Relation Age of Onset   ??? Diabetes Mother    ??? Arthritis-osteo Father    ??? COPD Sister    ??? Diabetes Brother    ??? Diabetes Brother         Review of Systems  Cardiovascular: Negative except as noted in HPI      Objective:       Vitals:    01/30/16 1104   BP: 128/70   Pulse: 75   Resp: 24   SpO2: 92%   Weight: 305 lb 4.8 oz (138.5 kg)   Height: 5\' 9"  (1.753 m)    Body mass index is 45.08 kg/(m^2).      General??PE  Mental Status??- Alert. General Appearance??- Not in acute distress.    Chest and Lung Exam??  Inspection:??Accessory muscles??- No use of accessory muscles in breathing.  Auscultation:??  Breath sounds:??- Normal.    Cardiovascular??  Inspection:??Jugular vein??- Bilateral??- Inspection Normal.  Palpation/Percussion:??  Apical Impulse:??- Normal.  Auscultation:??Rhythm??- Regular. Heart Sounds??- S1 WNL and S2 WNL. No S3 or S4.  Murmurs & Other Heart Sounds:??Auscultation of the heart reveals - No Murmurs.    Peripheral Vascular??   Upper Extremity:??Inspection??- Bilateral??- No Cyanotic nailbeds or Digital clubbing.  Lower Extremity:??  Palpation:??Edema??- Bilateral??- No edema.        Data Review:     EKG -  EKG: normal EKG, normal sinus rhythm, unchanged from previous tracings.    LABS- @brieflabs @      Allergies reviewed  No Known Allergies    Medications reviewed  Current Outpatient Prescriptions   Medication Sig   ??? OXYGEN-AIR DELIVERY SYSTEMS Take 4 L by inhalation continuous.   ??? metFORMIN (GLUCOPHAGE) 500 mg tablet Take 1 Tab by mouth two (2) times daily (with meals) for 30 days. Take 1 tablet with dinner daily.  If glucose >150, take 1 tablet twice daily.   ??? naproxen sodium (ALEVE) 220 mg tablet Take 440 mg by mouth every twelve (12) hours as needed for Pain.   ??? furosemide (LASIX) 40 mg tablet TAKE 1 TABLET BY MOUTH TWICE DAILY FOR SWELLING   ??? cholecalciferol, VITAMIN D3, (VITAMIN D3) 5,000 unit tab tablet Take 1 Tab by mouth daily.   ??? pravastatin (PRAVACHOL)  20 mg tablet TAKE 1 TABLET BY MOUTH ONCE DAILY AT NIGHT   ??? amLODIPine (NORVASC) 10 mg tablet TAKE 1 TABLET BY MOUTH DAILY   ??? albuterol-ipratropium (DUO-NEB) 2.5 mg-0.5 mg/3 ml nebu Take 3 mL by inhalation three (3) times daily.   ??? albuterol (PROVENTIL HFA, VENTOLIN HFA, PROAIR HFA) 90 mcg/actuation inhaler Take 1 Puff by inhalation every six (6) hours as needed for Wheezing.   ??? metoprolol succinate (TOPROL-XL) 200 mg XL tablet TAKE 1 TABLET BY MOUTH DAILY FOR HIGH BLOOD PRESSURE   ??? lisinopril (PRINIVIL, ZESTRIL) 40 mg tablet TAKE 1 TABLET BY MOUTH ONCE DAILY   ??? isosorbide dinitrate (ISORDIL) 30 mg tablet TAKE 1 TABLET BY MOUTH THREE TIMES DAILY   ??? aspirin 81 mg chewable tablet Take 81 mg by mouth daily.   ??? cloNIDine HCl (CATAPRES) 0.1 mg tablet TAKE 1 TABLET BY MOUTH ONCE DAILY   ??? umeclidinium-vilanterol (ANORO ELLIPTA) 62.5-25 mcg/actuation inhaler Take 1 Puff by inhalation daily. Indications: BRONCHOSPASM PREVENTION WITH COPD      No current facility-administered medications for this visit.          Assessment:       ICD-10-CM ICD-9-CM    1. Diastolic CHF, chronic (HCC) I50.32 428.32      428.0    2. Hypercholesterolemia E78.00 272.0 AMB POC EKG ROUTINE W/ 12 LEADS, INTER & REP        Orders Placed This Encounter   ??? AMB POC EKG ROUTINE W/ 12 LEADS, INTER & REP     Order Specific Question:   Reason for Exam:     Answer:   Routine   ??? DISCONTD: cloNIDine HCl (CATAPRES) 0.1 mg tablet     Sig: TK 1 T PO ONCE D     Refill:  5   ??? DISCONTD: glimepiride (AMARYL) 4 mg tablet     Sig: Take 4 mg by mouth daily.       Plan:     Stable since discharge with weight continuing to decline.  F/U 1 mo    Victory Dakin, MD

## 2016-01-30 NOTE — Progress Notes (Signed)
1. Have you been to the ER, urgent care clinic since your last visit?  Hospitalized since your last visit?Yes Where: [facility]    2. Have you seen or consulted any other health care providers [facility] since your last visit?  Include any pap smears or colon screening. No     Pt is here for hosp follow up

## 2016-01-30 NOTE — Progress Notes (Signed)
Chief Complaint   Patient presents with   ??? Hospital Follow Up     HPI:  Brittney Shelton is a 65 y.o. female presents for hospital follow up. Patient was admitted 01/23/16-01/27/16 with Acute on chronic respiratory failure due to??acute diastolic heart failure, pulmonary edema and acute COPD exacerbation in setting of morbid obesity. Hypertension and DM2 remained controlled. Admission record has been reviewed. Patient was discharged on Spiriva but her insurance does cover medication, will try ANORA. She is doing good, appointment is scheduled for pulmonary and cardiology services    Review of Systems  As per hpi    Past Medical History:   Diagnosis Date   ??? Chronic obstructive pulmonary disease (HCC)    ??? Congestive heart failure (HCC)    ??? Diabetes (HCC)    ??? Hypertension      History reviewed. No pertinent surgical history.     Social History   ??? Marital status: SINGLE     Spouse name: N/A   ??? Number of children: N/A   ??? Years of education: N/A     Social History Main Topics   ??? Smoking status: Never Smoker   ??? Smokeless tobacco: Never Used   ??? Alcohol use No   ??? Drug use: No   ??? Sexual activity: No     Current Outpatient Prescriptions   Medication Sig Dispense Refill   ??? cloNIDine HCl (CATAPRES) 0.1 mg tablet TK 1 T PO ONCE D  5   ??? glimepiride (AMARYL) 4 mg tablet Take 4 mg by mouth daily.     ??? OXYGEN-AIR DELIVERY SYSTEMS Take 4 L by inhalation continuous.     ??? metFORMIN (GLUCOPHAGE) 500 mg tablet Take 1 Tab by mouth two (2) times daily (with meals) for 30 days. Take 1 tablet with dinner daily.  If glucose >150, take 1 tablet twice daily. 60 Tab 0   ??? tiotropium (SPIRIVA WITH HANDIHALER) 18 mcg inhalation capsule Take 1 Cap by inhalation daily for 30 days. 30 Cap 0   ??? naproxen sodium (ALEVE) 220 mg tablet Take 440 mg by mouth every twelve (12) hours as needed for Pain.     ??? furosemide (LASIX) 40 mg tablet TAKE 1 TABLET BY MOUTH TWICE DAILY FOR SWELLING 180 Tab 0    ??? cholecalciferol, VITAMIN D3, (VITAMIN D3) 5,000 unit tab tablet Take 1 Tab by mouth daily. 30 Tab 5   ??? pravastatin (PRAVACHOL) 20 mg tablet TAKE 1 TABLET BY MOUTH ONCE DAILY AT NIGHT 90 Tab 5   ??? amLODIPine (NORVASC) 10 mg tablet TAKE 1 TABLET BY MOUTH DAILY 90 Tab 3   ??? albuterol-ipratropium (DUO-NEB) 2.5 mg-0.5 mg/3 ml nebu Take 3 mL by inhalation three (3) times daily.     ??? albuterol (PROVENTIL HFA, VENTOLIN HFA, PROAIR HFA) 90 mcg/actuation inhaler Take 1 Puff by inhalation every six (6) hours as needed for Wheezing. 1 Inhaler 0   ??? metoprolol succinate (TOPROL-XL) 200 mg XL tablet TAKE 1 TABLET BY MOUTH DAILY FOR HIGH BLOOD PRESSURE 90 Tab 5   ??? lisinopril (PRINIVIL, ZESTRIL) 40 mg tablet TAKE 1 TABLET BY MOUTH ONCE DAILY 90 Tab 5   ??? isosorbide dinitrate (ISORDIL) 30 mg tablet TAKE 1 TABLET BY MOUTH THREE TIMES DAILY 270 Tab 5   ??? aspirin 81 mg chewable tablet Take 81 mg by mouth daily.       No Known Allergies    Objective:  Visit Vitals   ??? BP 111/55 (BP 1  Location: Left arm, BP Patient Position: Sitting)   ??? Pulse 69   ??? Temp 96.3 ??F (35.7 ??C)   ??? Resp 24   ??? Ht 5\' 9"  (1.753 m)   ??? Wt 305 lb (138.3 kg)   ??? SpO2 91%  Comment: 4liters   ??? BMI 45.04 kg/m2     Physical Exam:   General appearance - alert, well appearing, in no distress  Mental status - alert, oriented to person, place, and time  EYE-PERRL, EOMI  Neck - supple, no significant adenopathy   Chest - symetric decreased breath sound, no wheezes, rales or rhonchi  Heart - normal rate, regular rhythm, normal blood pressure  Abdomen - soft, nontender, nondistended, no organomegaly  Ext-peripheral pulses normal, no pedal edema  Neuro -alert, oriented, normal speech, no focal findings   Back-full range of motion, no tenderness, palpable spasm or pain on motion     Assessment/Plan:    ICD-10-CM ICD-9-CM    1. Mixed simple and mucopurulent chronic bronchitis (HCC) J41.8 491.1 umeclidinium-vilanterol (ANORO ELLIPTA) 62.5-25 mcg/actuation inhaler       DISCONTINUED: umeclidinium-vilanterol (ANORO ELLIPTA) 62.5-25 mcg/actuation inhaler   2. Hypercholesterolemia E78.00 272.0    3. 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 Hypertension, well controlled I10 401.9    5. Acute on chronic diastolic (congestive) heart failure (HCC) I50.33 428.33      428.0      Patient Instructions        DASH Diet: Care Instructions  Your Care Instructions  The DASH diet is an eating plan that can help lower your blood pressure. DASH stands for Dietary Approaches to Stop Hypertension. Hypertension is high blood pressure.  The DASH diet focuses on eating foods that are high in calcium, potassium, and magnesium. These nutrients can lower blood pressure. The foods that are highest in these nutrients are fruits, vegetables, low-fat dairy products, nuts, seeds, and legumes. But taking calcium, potassium, and magnesium supplements instead of eating foods that are high in those nutrients does not have the same effect. The DASH diet also includes whole grains, fish, and poultry.  The DASH diet is one of several lifestyle changes your doctor may recommend to lower your high blood pressure. Your doctor may also want you to decrease the amount of sodium in your diet. Lowering sodium while following the DASH diet can lower blood pressure even further than just the DASH diet alone.  Follow-up care is a key part of your treatment and safety. Be sure to make and go to all appointments, and call your doctor if you are having problems. It's also a good idea to know your test results and keep a list of the medicines you take.  How can you care for yourself at home?  Following the DASH diet  ?? Eat 4 to 5 servings of fruit each day. A serving is 1 medium-sized piece of fruit, ?? cup chopped or canned fruit, 1/4 cup dried fruit, or 4 ounces (?? cup) of fruit juice. Choose fruit more often than fruit juice.  ?? Eat 4 to 5 servings of vegetables each day. A serving is 1 cup of  lettuce or raw leafy vegetables, ?? cup of chopped or cooked vegetables, or 4 ounces (?? cup) of vegetable juice. Choose vegetables more often than vegetable juice.  ?? Get 2 to 3 servings of low-fat and fat-free dairy each day. A serving is 8 ounces of milk, 1 cup of yogurt, or 1 ?? ounces of cheese.  ??  Eat 6 to 8 servings of grains each day. A serving is 1 slice of bread, 1 ounce of dry cereal, or ?? cup of cooked rice, pasta, or cooked cereal. Try to choose whole-grain products as much as possible.  ?? Limit lean meat, poultry, and fish to 2 servings each day. A serving is 3 ounces, about the size of a deck of cards.  ?? Eat 4 to 5 servings of nuts, seeds, and legumes (cooked dried beans, lentils, and split peas) each week. A serving is 1/3 cup of nuts, 2 tablespoons of seeds, or ?? cup of cooked beans or peas.  ?? Limit fats and oils to 2 to 3 servings each day. A serving is 1 teaspoon of vegetable oil or 2 tablespoons of salad dressing.  ?? Limit sweets and added sugars to 5 servings or less a week. A serving is 1 tablespoon jelly or jam, ?? cup sorbet, or 1 cup of lemonade.  ?? Eat less than 2,300 milligrams (mg) of sodium a day. If you limit your sodium to 1,500 mg a day, you can lower your blood pressure even more.  Tips for success  ?? Start small. Do not try to make dramatic changes to your diet all at once. You might feel that you are missing out on your favorite foods and then be more likely to not follow the plan. Make small changes, and stick with them. Once those changes become habit, add a few more changes.  ?? Try some of the following:  ?? Make it a goal to eat a fruit or vegetable at every meal and at snacks. This will make it easy to get the recommended amount of fruits and vegetables each day.  ?? Try yogurt topped with fruit and nuts for a snack or healthy dessert.  ?? Add lettuce, tomato, cucumber, and onion to sandwiches.  ?? Combine a ready-made pizza crust with low-fat mozzarella cheese and lots  of vegetable toppings. Try using tomatoes, squash, spinach, broccoli, carrots, cauliflower, and onions.  ?? Have a variety of cut-up vegetables with a low-fat dip as an appetizer instead of chips and dip.  ?? Sprinkle sunflower seeds or chopped almonds over salads. Or try adding chopped walnuts or almonds to cooked vegetables.  ?? Try some vegetarian meals using beans and peas. Add garbanzo or kidney beans to salads. Make burritos and tacos with mashed pinto beans or black beans.  Where can you learn more?  Go to [employer].  Enter [phone] in the search box to learn more about "DASH Diet: Care Instructions."  Current as of: September 03, 2015  Content Version: 11.3  ?? 2006-2017 Health[employer], Inc[employer]. Care instructions adapted under license by [employer] (which disclaims liability or warranty for this information). If you have questions about a medical condition or this instruction, always ask your healthcare professional. Healthwise, Incorporated disclaims any warranty or liability for your use of this information.    Follow-up Disposition:  Return keep your appointment.

## 2016-01-31 ENCOUNTER — Encounter: Admit: 2016-01-31 | Discharge: 2016-01-31 | Payer: MEDICARE | Primary: Internal Medicine

## 2016-01-31 NOTE — Progress Notes (Signed)
Spoke with patient after verifying name and DOB. Patient reports attending cardiology and PCP appointments. She has rescheduled pulmonary appointment to 9/717, follow up cardiology on 02/08/16, and PCP 03/13/16. Telehealth has been set up in patient home and she was able to demonstrate to nurse procedure for use.  Patient discussed Spiriva with PCP and was given prescription card for a free inhaler. She has picked up Spiriva from pharmacy and will discuss need for change at pulmonary appointment. She reports today's weight as 302. Patient has a advanced directive on file however she has not discussed with niece or brothers what her wishes are. Patient will rethink on what her wishes are and then discuss with family members listed.

## 2016-02-01 ENCOUNTER — Encounter: Admit: 2016-02-01 | Discharge: 2016-02-01 | Payer: MEDICARE | Primary: Internal Medicine

## 2016-02-04 ENCOUNTER — Encounter: Primary: Internal Medicine

## 2016-02-05 ENCOUNTER — Encounter: Admit: 2016-02-05 | Discharge: 2016-02-05 | Payer: MEDICARE | Primary: Internal Medicine

## 2016-02-06 ENCOUNTER — Encounter: Primary: Internal Medicine

## 2016-02-07 ENCOUNTER — Encounter: Primary: Internal Medicine

## 2016-02-07 NOTE — Progress Notes (Signed)
1200 spoke with [place] with home health telehealth and she reports trend of blood pressure from 150-170's/80-90's. This writer informed Sonya patient has cardiology appointment tomorrw 02/08/16 and she is to bring a log with her and discuss with provider. Lamar LaundrySonya is going to fax report to cardiology office so they will have record of patient blood pressure.     1210 This Clinical research associatewriter spoke with cardiology NN to report patient blood pressure record from telehealth will be faxed to office and to please give to provider/nurse to address at tomorrow's appointment 02/08/16.

## 2016-02-07 NOTE — Progress Notes (Signed)
Spoke with patient after verifying name and DOB. Patient reports she is doing well. Has not weighed for today. telehealth is at 0900. Her blood pressure was increased yesterday and weight was 298. Will discuss blood pressure with cardio at appointment tomorrow 02/08/16. She will take her readings with her for provider to review. She has pulm appointment today 02/07/16. Reminded patient to discuss changing Spirva to a medication covered by insurance since she only has Anora (given by PCP) currently has was for 1 month unsure if covered by insurance.     [id number] spoke with Orlando Penner with Dr. Jayme Cloud office to request copy of most recent PFT.  PSR informed this Clinical research associate PFT was last done August 2016. This Clinical research associate contact information provided. Awaiting report to be faxed.     6213 PFT report dated 01/02/15 received and given to provider for review and to be scanned into chart. This writer will follow up wit patient after cardio appointment.

## 2016-02-08 ENCOUNTER — Ambulatory Visit
Admit: 2016-02-08 | Discharge: 2016-02-08 | Payer: MEDICARE | Attending: Cardiovascular Disease | Primary: Internal Medicine

## 2016-02-08 DIAGNOSIS — I5032 Chronic diastolic (congestive) heart failure: Secondary | ICD-10-CM

## 2016-02-08 NOTE — Progress Notes (Signed)
This Clinical research associate spoke with Albin Felling at Dr. Jayme Cloud office to request copy of office note from 02/07/16. Carla informed this Clinical research associate Dr. Raiford Simmonds is not listed as PCP and patient will need to sign a medical release form or call the office and update record to reflect current PCP. She will send request to providers nurse as well. This Engineer, building services information provided and Albin Felling repeated information.

## 2016-02-08 NOTE — Progress Notes (Signed)
Chief Complaint   Patient presents with   ??? Other     2 week-swelling is better

## 2016-02-08 NOTE — Progress Notes (Signed)
NAME:  Brittney Shelton   DOB:   03/04/1951   MRN:   [id number]   PCP:  Erenest Rasher, MD    ??????    Subjective:     The patient is a 65 y.o. year old female  who returns for a hospital follow up. Since the last visit, patient reports no change in exercise tolerance, chest pain, edema, medication intolerance, palpitations, shortness of breath, PND/orthopnea wheezing, sputum, syncope, dizziness or light headedness. Feels better since discharge. Weight is down a few lbs. She is able to walk without limiting dyspnea. On home O2 at 4 L. .    Past Medical History:   Diagnosis Date   ??? Chronic obstructive pulmonary disease (HCC)    ??? Congestive heart failure (HCC)    ??? Diabetes (HCC)    ??? Hypertension        Social History   Substance Use Topics   ??? Smoking status: Never Smoker   ??? Smokeless tobacco: Never Used   ??? Alcohol use No      Family History   Problem Relation Age of Onset   ??? Diabetes Mother    ??? Arthritis-osteo Father    ??? COPD Sister    ??? Diabetes Brother    ??? Diabetes Brother         Review of Systems  Constitutional: Negative for fever, chills, and diaphoresis.   Respiratory: Negative for cough, hemoptysis, sputum production, shortness of breath and wheezing.   Cardiovascular: Negative for chest pain, palpitations, orthopnea, claudication, leg swelling and PND.   Gastrointestinal: Negative for heartburn, nausea, vomiting, blood in stool and melena.   Genitourinary: Negative for dysuria and flank pain.   Musculoskeletal: Negative for joint pain and back pain.   Skin: Negative for rash.   Neurological: Negative for focal weakness, seizures, loss of consciousness, weakness and headaches.   Endo/Heme/Allergies: Does not bruise/bleed easily.   Psychiatric/Behavioral: Negative for memory loss. The patient does not have insomnia.        Objective:       Vitals:    02/08/16 1012   BP: 148/80   Pulse: 67   Resp: 18   SpO2: 92%   Weight: 299 lb 12.8 oz (136 kg)   Height: 5\' 9"  (1.753 m)     Body mass index is 44.27 kg/(m^2).      General??PE    Gen: NAD     Mental Status - Alert. General Appearance - Not in acute distress.     Neck - no JVD     Chest and Lung Exam     Inspection: Accessory muscles - No use of accessory muscles in breathing.   Auscultation:   Breath sounds: - Normal.     Cardiovascular   Inspection: Jugular vein - Bilateral - Inspection Normal.   Palpation/Percussion:   Apical Impulse: - Normal.   Auscultation: Rhythm - Regular. Heart Sounds - S1 WNL and S2 WNL. No S3 or S4.   Murmurs & Other Heart Sounds: Auscultation of the heart reveals - No Murmurs.     Peripheral Vascular   Upper Extremity: Inspection - Bilateral - No Cyanotic nailbeds or Digital clubbing.   Lower Extremity:   Palpation: Edema - Bilateral - No edema.     Abdomen: Soft, non-tender, bowel sounds are active.     Neuro: A&O times 3, CN and motor grossly WNL      Data Review:     EKG -  Sinus  Rhythm   -  Left atrial enlargement.    -  Nonspecific T-abnormality.       LABS-   Lab Results   Component Value Date/Time    Cholesterol, total 206 11/07/2015 12:25 PM    HDL Cholesterol 51 11/07/2015 12:25 PM    LDL, calculated 135 11/07/2015 12:25 PM    VLDL, calculated 20 11/07/2015 12:25 PM    Triglyceride 102 11/07/2015 12:25 PM         Allergies reviewed  No Known Allergies    Medications reviewed  Current Outpatient Prescriptions   Medication Sig   ??? umeclidinium-vilanterol (ANORO ELLIPTA) 62.5-25 mcg/actuation inhaler Take 1 Puff by inhalation daily. Indications: BRONCHOSPASM PREVENTION WITH COPD   ??? OXYGEN-AIR DELIVERY SYSTEMS Take 4 L by inhalation continuous.   ??? metFORMIN (GLUCOPHAGE) 500 mg tablet Take 1 Tab by mouth two (2) times daily (with meals) for 30 days. Take 1 tablet with dinner daily.  If glucose >150, take 1 tablet twice daily.   ??? naproxen sodium (ALEVE) 220 mg tablet Take 440 mg by mouth every twelve (12) hours as needed for Pain.   ??? cholecalciferol, VITAMIN D3, (VITAMIN D3) 5,000 unit tab tablet Take 1  Tab by mouth daily.   ??? pravastatin (PRAVACHOL) 20 mg tablet TAKE 1 TABLET BY MOUTH ONCE DAILY AT NIGHT   ??? amLODIPine (NORVASC) 10 mg tablet TAKE 1 TABLET BY MOUTH DAILY   ??? albuterol-ipratropium (DUO-NEB) 2.5 mg-0.5 mg/3 ml nebu Take 3 mL by inhalation three (3) times daily.   ??? albuterol (PROVENTIL HFA, VENTOLIN HFA, PROAIR HFA) 90 mcg/actuation inhaler Take 1 Puff by inhalation every six (6) hours as needed for Wheezing.   ??? metoprolol succinate (TOPROL-XL) 200 mg XL tablet TAKE 1 TABLET BY MOUTH DAILY FOR HIGH BLOOD PRESSURE   ??? lisinopril (PRINIVIL, ZESTRIL) 40 mg tablet TAKE 1 TABLET BY MOUTH ONCE DAILY   ??? isosorbide dinitrate (ISORDIL) 30 mg tablet TAKE 1 TABLET BY MOUTH THREE TIMES DAILY   ??? aspirin 81 mg chewable tablet Take 81 mg by mouth daily.   ??? furosemide (LASIX) 40 mg tablet TAKE 1 TABLET BY MOUTH TWICE DAILY FOR SWELLING   ??? cloNIDine HCl (CATAPRES) 0.1 mg tablet TAKE 1 TABLET BY MOUTH ONCE DAILY     No current facility-administered medications for this visit.          Assessment:     No diagnosis found.     No orders of the defined types were placed in this encounter.      Patient Active Problem List   Diagnosis Code   ??? Well controlled type 2 diabetes mellitus (HCC) E11.9   ??? Hypercholesterolemia E78.00   ??? 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 COPD (chronic obstructive pulmonary disease) (HCC) J44.9   ??? Obesity E66.9   ??? Requires continuous at home supplemental oxygen Z99.81   ??? Dependence on supplemental oxygen Z99.81   ??? ACP (advance care planning) Z71.89   ??? Counseling regarding advanced care planning and goals of care Z71.89   ??? Dyspnea R06.00   ??? Acute on chronic respiratory failure with hypoxia and hypercapnia (HCC) J96.21, J96.22   ??? Acute on chronic diastolic (congestive) heart failure (HCC) I50.33       Plan:     Patient presents for follow up.  We will see her back in 3 mo.      Diastolic CHF with pulmonary edema and acute COPD exacerbation in setting  of morbid obesity: ??Echo 4/17 with EF 60-65%. There were no regional wall motion abnormalities. Wall thickness was mildly increased. ??Baseline 4-5 LPM.  Weight is stable. Compensated. Instructed to avoid high sodium foods.     Hypertension, benign/essential:  con't norvasc, isordil, metoprolol, lisinopril.  Lasix 40mg  BID.     Non insulin dependent DM2 controlled: ??Hypoglycemia resolved with medication adjustment.  On Glucophage with fasting sugars in the 120s.     Hyperlipidemia:??con't pravachol??    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  ??  Victory DakinBrian L Shawneen Deetz, MD

## 2016-02-09 ENCOUNTER — Encounter: Admit: 2016-02-09 | Discharge: 2016-02-09 | Payer: MEDICARE | Primary: Internal Medicine

## 2016-02-11 ENCOUNTER — Encounter: Admit: 2016-02-11 | Discharge: 2016-02-11 | Payer: MEDICARE | Primary: Internal Medicine

## 2016-02-11 NOTE — Progress Notes (Signed)
This note will not be viewable in MyChart.    Called Sonya at [facility] to check on pt's pulse.  LVM stating my name, NN at [facility], pt's name and DOB, the date and time of call, and my direct office telephone number.

## 2016-02-11 NOTE — Progress Notes (Signed)
Spoke with patient after verifying name and DOB. Patient reports her weight this morning at 296. She did attend appointments with Dr. Jayme Cloud. She reports a good report and follow for March. Patient received and prescription for Anora and it is covered by insurance. Patient informed this Clinical research associate she has had sleep study in the past and does not use CPAP. Cardio appointment went well and she is to follow up in 3 months. Patient and telehealth nurse questioning if patient is to restart clonidine since office note says continue but it was discontinued at discharge from hospital. Will contact cardio for clarification.  Patient will contact [facility] to inquire if she can receive lighter portable tanks and if order is needed will have them fax what is needed to PCP. PCP contact information provided to patient.     1140 Spoke with cardio NN regarding clarification. Will speak with provider and inform this writer of clarification.    1630  Per AnnMarie cardio NN. NP Wenzel advised to start Clonidine 0.1 mg in am. Monitor blood pressure and heart rate. (see patient outreach for further documentation.)This writer will contact patient and advise of start of medication.     1649 spoke with patient to advised per NP Wenzel to start Clonidine 0.1 mg in am. Monitor blood pressure and heart rate. Patient verbalized understanding and verified she has medication in her home.

## 2016-02-11 NOTE — Progress Notes (Signed)
This note will not be viewable in MyChart.    Per discussion with PCP NN, reviewed case with NP whose note states:     Amy Chales Abrahams, ANP  Dorothea Ogle, RN ??   ??    ??    ??   ?? Please have her start clonidine 0.1 mg in am. Monitor bps and HR.       Called [facility] to report change and have notified PCP NN by phone message and connectcare.  Spoke with Dawn at [facility] who transferred call to [place].  Call placed on hold.  Repeated above information to [place] and to [place] at PCP office.  Judeth Cornfield to call the pt to let her know to take this medication.

## 2016-02-12 ENCOUNTER — Encounter: Admit: 2016-02-12 | Discharge: 2016-02-12 | Payer: MEDICARE | Primary: Internal Medicine

## 2016-02-13 ENCOUNTER — Encounter: Admit: 2016-02-13 | Discharge: 2016-02-13 | Payer: MEDICARE | Primary: Internal Medicine

## 2016-02-13 ENCOUNTER — Encounter

## 2016-02-13 NOTE — Progress Notes (Signed)
This note will not be viewable in MyChart.    Called Erline LevineSonya Barcol, [facility].  Nurse reports that pt is refusing to take clonodine as it "made her crazy, out of her head."  Pt also reports that she is out of isordil.  Upon medication review, pt should still have 3 month supply of isordil.  [facility] need [facility] thorough medication reconciliation at pt's house.    Called PCP NN to report call.  LVM

## 2016-02-13 NOTE — Progress Notes (Signed)
Spoke with patient after verifying name and DOB regarding blood pressure. Patient reports blood pressure today, 02/13/16 of 132/80, weight as 299, and she was able to walk in grocery store yesterday with SOB.. Patient is speaking in complete sentence, denies increase in pillow use, has swelling by end of day but is gone by morning. Advised patient to elevate legs while sitting. Will follow up with patient on Friday.

## 2016-02-13 NOTE — Progress Notes (Signed)
Spoke with patient after verifying name and DOB regarding clonidine 0.1 mg and isordil 30 mg. Patient reports NKDA or medications that make her feel different in the head. Patient verified she has Isordil 30 mg in her home prescription last filled 02/25/16 with 270 tablets (90 day supply) and she is due to refill at pharmacy on 02/19/16. Verified patient is restarted clonidine 0.1 mg on 02/12/16. At this time she has 2 refills on her prescription bottle. Patient wiill call pharmacy to have isordil, lisinopril,  and clonidine refilled.       1432 incoming call from patient to inform this writer pharmacy is requesting new prescription for clonidine 0.1 mg since it had previously been discontinued. At this time she has 11 pills. She also needs a refill on Lasix 40 mg. She request 90 day supply.

## 2016-02-14 ENCOUNTER — Encounter: Primary: Internal Medicine

## 2016-02-14 MED ORDER — CLONIDINE 0.1 MG TAB
0.1 mg | ORAL_TABLET | Freq: Every day | ORAL | 12 refills | Status: DC
Start: 2016-02-14 — End: 2016-02-14

## 2016-02-15 MED ORDER — CLONIDINE 0.1 MG TAB
0.1 mg | ORAL_TABLET | ORAL | 12 refills | Status: DC
Start: 2016-02-15 — End: 2016-02-16

## 2016-02-15 NOTE — Progress Notes (Signed)
Spoke with patient after verifying name and DOB. Patient is complaint with telehealth. She continues to watch diet, limit salt intake and fluids. She reports weight between 296-297 BS of 132. She saw eye doctor yesterday and was informed she needs cataract surgery. It has not been scheduled at this time. Patient has no concerns at this time. Denies increase in SOB, swelling, and pillow use. She is speaking in complete sentences.

## 2016-02-16 MED ORDER — CLONIDINE 0.1 MG TAB
0.1 mg | ORAL_TABLET | ORAL | 0 refills | Status: DC
Start: 2016-02-16 — End: 2017-04-04

## 2016-02-18 ENCOUNTER — Encounter: Admit: 2016-02-18 | Discharge: 2016-02-18 | Payer: MEDICARE | Primary: Internal Medicine

## 2016-02-19 ENCOUNTER — Encounter

## 2016-02-19 MED ORDER — FUROSEMIDE 40 MG TAB
40 mg | ORAL_TABLET | ORAL | 0 refills | Status: DC
Start: 2016-02-19 — End: 2016-05-18

## 2016-02-19 NOTE — Telephone Encounter (Signed)
Patient requested refill and 90 day supply.  Last office visit 01/30/16 next office visit is 03/13/16

## 2016-02-19 NOTE — Progress Notes (Signed)
Spoke with patient after verifying name and DOB. Patient reports weight as 298 for last few days. Continues to watch diet choices even when out to dinner, limiting fluid and salt intake. FBS today was 124 she has nt had to take Metformin at all. She reports not being tired, SOB or DOE after visiting stores. She is planning a 2-3 hour shopping trip. Advised patient to plan ahead for trip, take extra oxygen tank, and take breaks during trip to rest and not over exert self. Patient repots going to store briefly and they have rst area and she is taking nephew who has cell phone with her since does not own cell phone. .Brittney Shelton

## 2016-02-21 ENCOUNTER — Encounter

## 2016-02-21 MED ORDER — METOPROLOL SUCCINATE SR 200 MG 24 HR TAB
200 mg | ORAL_TABLET | ORAL | 2 refills | Status: DC
Start: 2016-02-21 — End: 2016-11-12

## 2016-02-22 ENCOUNTER — Encounter: Admit: 2016-02-22 | Discharge: 2016-02-22 | Payer: MEDICARE | Primary: Internal Medicine

## 2016-02-26 ENCOUNTER — Encounter: Admit: 2016-02-26 | Discharge: 2016-02-26 | Payer: MEDICARE | Primary: Internal Medicine

## 2016-02-26 NOTE — Progress Notes (Signed)
1515 discussed patient blood pressure readings and Metformin use with Dr. Raiford Simmondsomah. Per Dr. Raiford Simmondsomah patient is to continue current blood pressure medications. Patient is to restart taking Metformin 500 mg.      1530 spoke with patient after verifying name and DOB. Patient advised per Dr. Raiford Simmondsomah to restart Metformin 500 mg. Patient verbalized understanding and repeated information. Patient reports she has spoken with home health nurse regarding her blood pressure monitor. She will need to change batteries and elevate wrist when taking blood pressure. Advised patient to continue routine of BP, HR, SPO2 at 0900 everyday after telehealth complete.

## 2016-02-26 NOTE — Progress Notes (Signed)
This Clinical research associate received telehealth record from 9/19-26/17.  02/19/16 @ 0903  BP 131/84 HR 58 SPO2 91 weight 298.5 @ 1019 HR 65 and SPO2 82 @ 1027 SPO2 92  02/20/16 @0903   BP 141/86 HR 60 SPO2 91 weight 297.5  02/21/16 @ 0903 BP 145/101 HR 60 SPO2 92 weight 298.5 @1128  BP 141/88 HR 67  02/22/16 @ 0903 BP 125/85 HR 57 SPO2 94 weight 299   02/23/16 @ 0904 BP 157/89 HR 60 SPO2 92 weight 299 @ 0936 BP134/86 HR 66  02/24/16 @ 0903 BP 132/88 HR 60 SPO2 90 weight 297.5  02/25/16 @0903  BP 119/80 HR 57 SPO2 90 weight 295 @ 0929 HR 70  02/26/16 @ 0903 BP 124/81 HR 59 SPO2 91 weight 296.5    Goals Addressed     ??? Improve activity tolerance (pt-stated)                  02/19/16 Patient wants to be able walk into PCP office without stopping for break at  appointment in October. Advised patient to slowly increase number of steps, wear oxygen a prescribed and to be sure to have extra tank in case needed. SAJ    02/26/16 Patient wants to weight to be in the 280's and be able to walk to her mailbox by Christmas. This Clinical research associate asked educated patient on increase walking slowly to build tolerance. She will measure how far she walks inside her home by laps and be able to tell this writer how many she is walking per day since she is unable to at this time. Today's weight is 296.5 pounds. SAJ      ??? Knowledge and adherence medication (ie. action, side effects, missed dose, etc.)                  02/11/16 Will take clonodine 0.1mg  once a day for elevated BP.  Pt will report symptoms to PCP NN within 4 hours.  AFP    02/13/16 Patient reports taking clonidine 0.1 mg 02/12/16 and 02/13/16. Blood pressure 132/80. She has 11 pills left in bottle. SAJ    02/19/16 Patient repots taking Clonidine 0.1 mg daily as prescribed. SAJ     02/26/16 Patient has refilled Clonidine and reports taking daily. Has picked up Lasix from pharmacy. Blood pressure today is 124/81. SAJ          Patient denies hyperglycemic and hypoglycemic episodes. FBS 130 today,  02/26/16 Patient denies sweets, does have 2 slices of bread daily with breakfast, salads for lunch no breads with dinner usually baked meat with vegetable. Restricting fluid to 3 bottles per day.

## 2016-02-29 ENCOUNTER — Encounter: Admit: 2016-02-29 | Discharge: 2016-02-29 | Payer: MEDICARE | Primary: Internal Medicine

## 2016-03-03 NOTE — Progress Notes (Signed)
This Clinical research associatewriter received telehealth report given to provider for review. Patient scheduled to follow up on 03/13/16.    This Clinical research associatewriter received telehealth record from 02/25/16-03/03/16.    02/25/16 @0903  BP 119/80 HR 57 SPO2 90 weight 295 @ 0929 HR 70  02/26/16 @ 0903 BP 124/81 HR 59 SPO2 91 weight 296.5  02/27/16 @ 0903  BP 120/81 HR 59 SPO2 91 weight 295.5   02/28/16 @0903   BP 123/81 HR 56 SPO2 90 weight 296.5  02/29/16 @ 0903 BP 126/77 HR 56 SPO2 95 weight 295.5   03/01/16 @ 0903 BP 139/92 HR 57 SPO2 95 weight 294 @0941  HR 62  03/02/16 @ 0903 BP 130/87 HR 55 SPO2 93 weight 294   03/03/16 @ 0903 BP 150/90 HR 57 SPO2 90 weight 295

## 2016-03-03 NOTE — Progress Notes (Signed)
03/04/16 @ 1320 Provider advised no changes at this time, continue to monitor and will reevaluate at appointment 03/13/16.    1516 spoke with patient after name and DOB verified. Patient advised per Dr. Raiford Simmondsomah no changes to medications at this time, continue to monitor, bring records to appointment on 03/13/16. Patient denies SOB and ncrease in pillow use. She is able to speak in complete sentences without sounding winded. Complains of "arthritis in back " bothering her. She has take Aleve and it helps. Willcall office if pain not relieved with Aleve and discuss at next office visit 03/13/16. Patient reports her medicaid does not cover personal care aide,  home health has discharged her from services, and telehealth has picked up equipment.

## 2016-03-04 ENCOUNTER — Encounter: Admit: 2016-03-04 | Discharge: 2016-03-04 | Payer: MEDICARE | Primary: Internal Medicine

## 2016-03-13 ENCOUNTER — Ambulatory Visit: Admit: 2016-03-13 | Discharge: 2016-03-13 | Payer: MEDICARE | Attending: Internal Medicine | Primary: Internal Medicine

## 2016-03-13 ENCOUNTER — Inpatient Hospital Stay: Admit: 2016-04-28 | Payer: MEDICARE | Primary: Internal Medicine

## 2016-03-13 DIAGNOSIS — Z0000 Encounter for general adult medical examination without abnormal findings: Secondary | ICD-10-CM

## 2016-03-13 MED ORDER — HYDROCODONE 10 MG-CHLORPHENIRAMINE 8 MG/5 ML ORAL SUSP EXTEND.REL 12HR
10-8 mg/5 mL | Freq: Two times a day (BID) | ORAL | 0 refills | Status: DC | PRN
Start: 2016-03-13 — End: 2016-06-11

## 2016-03-13 MED ORDER — AZITHROMYCIN 500 MG TAB
500 mg | ORAL_TABLET | Freq: Every day | ORAL | 0 refills | Status: AC
Start: 2016-03-13 — End: 2016-03-23

## 2016-03-13 MED ORDER — PNEUMOCOCCAL 13-VAL CONJ VACCINE-DIP CRM (PF) 0.5 ML IM SYRINGE
0.5 mL | Freq: Once | INTRAMUSCULAR | 0 refills | Status: AC
Start: 2016-03-13 — End: 2016-03-13

## 2016-03-13 NOTE — Telephone Encounter (Signed)
Called & discussed

## 2016-03-13 NOTE — Progress Notes (Signed)
Weight at home 297, restricting fluids and salt, no sodas, BS this morning 130 yesterday afternoon 111. Need surgery for glaucoma per Dr. Jackie Plum referred to Tawanna Cooler  Met goal of walking into PCP office without stopping currently doing 3 laps at home. No increase in pillows no sleeping sitting up clothes fit better plan for holiday eating teaspoon servings     Goals Addressed     ??? Improve activity tolerance (pt-stated)                  02/19/16 Patient wants to be able walk into PCP office without stopping for break at  appointment in October. Advised patient to slowly increase number of steps, wear oxygen a prescribed and to be sure to have extra tank in case needed. SAJ    02/26/16 Patient wants to weight to be in the 280's and be able to walk to her mailbox by Christmas. This Probation officer asked educated patient on increase walking slowly to build tolerance. She will measure how far she walks inside her home by laps and be able to tell this writer how many she is walking per day since she is unable to at this time. Today's weight is 296.5 pounds. SAJ    03/13/16 Patient reports walking 3 laps per day inside her home. She met goal of walking into providers office for appointment today without stopping. Did not meet goal of weight however her clothes are fitting better. Today's home weight is 297. Advised patient to continue 3 laps until next outreach and will discuss increasing laps. SAJ

## 2016-03-13 NOTE — Progress Notes (Signed)
Chief Complaint   Patient presents with   ??? Diabetes   ??? Cholesterol Problem     HPI:  HOLLIDAY SHEAFFER is a 65 y.o. AA female with Diabetes, hypertension, copd on home oxygen, hypercholesterolemia  Presents for medicare wellness visit. Blood pressure is not at goal. Patient is doing good     Review of Systems  As per hpi    Past Medical History:   Diagnosis Date   ??? Chronic obstructive pulmonary disease (HCC)    ??? Congestive heart failure (HCC)    ??? Diabetes (HCC)    ??? Hypertension      History reviewed. No pertinent surgical history.  Social History     Social History   ??? Marital status: SINGLE     Spouse name: N/A   ??? Number of children: N/A   ??? Years of education: N/A     Social History Main Topics   ??? Smoking status: Never Smoker   ??? Smokeless tobacco: Never Used   ??? Alcohol use No   ??? Drug use: No   ??? Sexual activity: No     Other Topics Concern   ??? None     Social History Narrative     Current Outpatient Prescriptions   Medication Sig Dispense Refill   ??? metoprolol succinate (TOPROL-XL) 200 mg XL tablet TAKE 1 TABLET BY MOUTH ONCE DAILY FOR HIGH BLOOD PRESSURE 90 Tab 2   ??? furosemide (LASIX) 40 mg tablet TAKE 1 TABLET BY MOUTH TWICE DAILY FOR SWELLING 180 Tab 0   ??? cloNIDine HCl (CATAPRES) 0.1 mg tablet TAKE 1 TABLET BY MOUTH ONCE DAILY 90 Tab 0   ??? umeclidinium-vilanterol (ANORO ELLIPTA) 62.5-25 mcg/actuation inhaler Take 1 Puff by inhalation daily. Indications: BRONCHOSPASM PREVENTION WITH COPD 1 Inhaler 5   ??? OXYGEN-AIR DELIVERY SYSTEMS Take 4 L by inhalation continuous.     ??? naproxen sodium (ALEVE) 220 mg tablet Take 440 mg by mouth every twelve (12) hours as needed for Pain.     ??? cholecalciferol, VITAMIN D3, (VITAMIN D3) 5,000 unit tab tablet Take 1 Tab by mouth daily. 30 Tab 5   ??? pravastatin (PRAVACHOL) 20 mg tablet TAKE 1 TABLET BY MOUTH ONCE DAILY AT NIGHT 90 Tab 5   ??? amLODIPine (NORVASC) 10 mg tablet TAKE 1 TABLET BY MOUTH DAILY 90 Tab 3    ??? albuterol-ipratropium (DUO-NEB) 2.5 mg-0.5 mg/3 ml nebu Take 3 mL by inhalation three (3) times daily.     ??? albuterol (PROVENTIL HFA, VENTOLIN HFA, PROAIR HFA) 90 mcg/actuation inhaler Take 1 Puff by inhalation every six (6) hours as needed for Wheezing. 1 Inhaler 0   ??? lisinopril (PRINIVIL, ZESTRIL) 40 mg tablet TAKE 1 TABLET BY MOUTH ONCE DAILY 90 Tab 5   ??? isosorbide dinitrate (ISORDIL) 30 mg tablet TAKE 1 TABLET BY MOUTH THREE TIMES DAILY 270 Tab 5   ??? aspirin 81 mg chewable tablet Take 81 mg by mouth daily.       No Known Allergies    Objective:  Visit Vitals   ??? BP 145/66 (BP 1 Location: Left arm, BP Patient Position: Sitting)   ??? Pulse 66   ??? Temp 96.4 ??F (35.8 ??C) (Oral)   ??? Resp 22   ??? Ht 5\' 9"  (1.753 m)   ??? Wt 298 lb 3.2 oz (135.3 kg)   ??? SpO2 (!) 89%  Comment: 4L O2 nasal canula   ??? BMI 44.04 kg/m2     Physical Exam:  General appearance - alert, obese appearing,  in no distress  EYE-PERRL, EOMI  Neck - supple, no significant adenopathy   Chest - clear to auscultation, no wheezes, rales or rhonchi  Heart - normal rate, regular rhythm, normal blood pressure   Abdomen -obese, soft, nontender, nondistended, no organomegaly  Lymph- no adenopathy palpable  Ext-peripheral pulses normal, no pedal edema  Neuro -alert, oriented, normal speech, no focal findings   Back-full range of motion, no tenderness, palpable spasm or pain on motion     Assessment/Plan:    ICD-10-CM ICD-9-CM    1. Medicare annual wellness visit, subsequent Z00.00 V70.0 METABOLIC PANEL, COMPREHENSIVE   2. Encounter for immunization Z23 V03.89 pneumococcal 13 val conj dip (PREVNAR-13) 0.5 mL syrg injection      INFLUENZA VIRUS VACCINE, HIGH DOSE SEASONAL, PRESERVATIVE FREE   3. Well controlled type 2 diabetes mellitus (HCC) E11.9 250.00 MICROALBUMIN, UR, RAND W/ MICROALBUMIN/CREA RATIO      METABOLIC PANEL, COMPREHENSIVE      HEMOGLOBIN A1C WITH EAG   4. Mixed simple and mucopurulent chronic bronchitis (HCC) J41.8 491.1  chlorpheniramine-HYDROcodone (TUSSIONEX) 10-8 mg/5 mL suspension   5. Hypercholesterolemia E78.00 272.0    6. 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 Requires continuous at home supplemental oxygen Z99.81 V46.2    8. Hypovitaminosis D E55.9 268.9    9. Acute cystitis without hematuria N30.00 595.0 UA/M W/RFLX CULTURE, ROUTINE      azithromycin (ZITHROMAX) 500 mg tab   10. Hypertension goal BP (blood pressure) < 130/80 I10 401.9 METABOLIC PANEL, COMPREHENSIVE   11. Glaucoma screening Z13.5 V80.1 REFERRAL TO OPHTHALMOLOGY     Patient Instructions     Vaccine Information Statement    Influenza (Flu) Vaccine (Inactivated or Recombinant): What you need to know    Many Vaccine Information Statements are available in Spanish and other languages. See PromoAge.[email or web address]  Hojas de Informaci??n Sobre Vacunas est??n disponibles en Espa??ol y en muchos otros idiomas. Visite PromoAge.[email or web address]    1. Why get vaccinated?    Influenza (???flu???) is a contagious disease that spreads around the [place] every year, usually between October and May.     Flu is caused by influenza viruses, and is spread mainly by coughing, sneezing, and close contact.     Anyone can get flu. Flu strikes suddenly and can last several days. Symptoms vary by age, but can include:  ??? fever/chills  ??? sore throat  ??? muscle aches  ??? fatigue  ??? cough  ??? headache   ??? runny or stuffy nose    Flu can also lead to pneumonia and blood infections, and cause diarrhea and seizures in children.  If you have a medical condition, such as heart or lung disease, flu can make it worse.    Flu is more dangerous for some people. Infants and young children, people 52 years of age and older, pregnant women, and people with certain health conditions or a weakened immune system are at greatest risk.      Each year thousands of people in the [place] die from flu, and many more are hospitalized.     Flu vaccine can:  ??? keep you from getting flu,   ??? make flu less severe if you do get it, and  ??? keep you from spreading flu to your family and other people.     2. Inactivated and recombinant flu vaccines    A dose of flu vaccine is recommended every flu season. Children 6  months through 72 years of age may need two doses during the same flu season.  Everyone else needs only one dose each flu season.       Some inactivated flu vaccines contain a very small amount of a mercury-based preservative called thimerosal. Studies have not shown thimerosal in vaccines to be harmful, but flu vaccines that do not contain thimerosal are available.    There is no live flu virus in flu shots.  They cannot cause the flu.     There are many flu viruses, and they are always changing. Each year a new flu vaccine is made to protect against three or four viruses that are likely to cause disease in the upcoming flu season. But even when the vaccine doesn???t exactly match these viruses, it may still provide some protection    Flu vaccine cannot prevent:  ??? flu that is caused by a virus not covered by the vaccine, or  ??? illnesses that look like flu but are not.    It takes about 2 weeks for protection to develop after vaccination, and protection lasts through the flu season.     3. Some people should not get this vaccine    Tell the person who is giving you the vaccine:    ??? If you have any severe, life-threatening allergies.    If you ever had a life-threatening allergic reaction after a dose of flu vaccine, or have a severe allergy to any part of this vaccine, you may be advised not to get vaccinated.  Most, but not all, types of flu vaccine contain a small amount of egg protein.       ??? If you ever had Guillain-Barr?? Syndrome (also called GBS).   Some people with a history of GBS should not get this vaccine. This should be discussed with your doctor.    ??? If you are not feeling well.    It is usually okay to get flu vaccine when you have a mild illness, but  you might be asked to come back when you feel better.      4. Risks of a vaccine reaction    With any medicine, including vaccines, there is a chance of reactions. These are usually mild and go away on their own, but serious reactions are also possible.     Most people who get a flu shot do not have any problems with it.     Minor problems following a flu shot include:   ??? soreness, redness, or swelling where the shot was given    ??? hoarseness  ??? sore, red or itchy eyes  ??? cough  ??? fever  ??? aches  ??? headache  ??? itching  ??? fatigue  If these problems occur, they usually begin soon after the shot and last 1 or 2 days.     More serious problems following a flu shot can include the following:    ??? There may be a small increased risk of Guillain-Barr?? Syndrome (GBS) after inactivated flu vaccine.  This risk has been estimated at 1 or 2 additional cases per million people vaccinated. This is much lower than the risk of severe complications from flu, which can be prevented by flu vaccine.      ??? Young children who get the flu shot along with pneumococcal vaccine (PCV13) and/or DTaP vaccine at the same time might be slightly more likely to have a seizure caused by fever. Ask your doctor for more information. Tell  your doctor if a child who is getting flu vaccine has ever had a seizure.     Problems that could happen after any injected vaccine:     ??? People sometimes faint after a medical procedure, including vaccination. Sitting or lying down for about 15 minutes can help prevent fainting, and injuries caused by a fall. Tell your doctor if you feel dizzy, or have vision changes or ringing in the ears.    ??? Some people get severe pain in the shoulder and have difficulty moving the arm where a shot was given. This happens very rarely.    ??? Any medication can cause a severe allergic reaction. Such reactions from a vaccine are very rare, estimated at about 1 in a million doses, and  would happen within a few minutes to a few hours after the vaccination.    As with any medicine, there is a very remote chance of a vaccine causing a serious injury or death.    The safety of vaccines is always being monitored. For more information, visit: http://floyd.org/www.cdc.gov/vaccinesafety/    5. What if there is a serious reaction?    What should I look for?    ??? Look for anything that concerns you, such as signs of a severe allergic reaction, very high fever, or unusual behavior.    Signs of a severe allergic reaction can include hives, swelling of the face and throat, difficulty breathing, a fast heartbeat, dizziness, and weakness ??? usually within a few minutes to a few hours after the vaccination.    What should I do?    ??? If you think it is a severe allergic reaction or other emergency that can???t wait, call 9-1-1 and get the person to the nearest hospital. Otherwise, call your doctor.    ??? Reactions should be reported to the Vaccine Adverse Event Reporting System (VAERS). Your doctor should file this report, or you can do it yourself through  the VAERS web site at www.vaers.LAgents.nohhs.gov, or by calling 1-(269) 491-7501.    VAERS does not give medical advice.    6. The National Vaccine Injury Compensation Program    The [employer] Vaccine Injury Compensation Program (VICP) is a federal program that was created to compensate people who may have been injured by certain vaccines.    Persons who believe they may have been injured by a vaccine can learn about the program and about filing a claim by calling 1-5622330251 or visiting the VICP website at SpiritualWord.atwww.hrsa.gov/vaccinecompensation.  There is a time limit to file a claim for compensation.    7. How can I learn more?  ??? Ask your healthcare provider. He or she can give you the vaccine package insert or suggest other sources of information.  ??? Call your local or state health department.  ??? Contact the Centers for Disease Control and Prevention (CDC):   - Call 705-147-52151-(817)144-0851 (1-800-CDC-INFO) or  - Visit CDC???s website at BiotechRoom.com.cywww.cdc.gov/flu    Vaccine Information Statement   Inactivated Influenza Vaccine   01/06/2014  [place] ?? [id number]    Department of Health and Insurance risk surveyorHuman Services  Centers for Disease Control and Prevention    Office Use Only      Follow-up Disposition:  Return in about 3 months (around 06/13/2016) for routine follow up.

## 2016-03-13 NOTE — Patient Instructions (Signed)
Vaccine Information Statement    Influenza (Flu) Vaccine (Inactivated or Recombinant): What you need to know    Many Vaccine Information Statements are available in Spanish and other languages. See www.immunize.org/vis  Hojas de Informaci??n Sobre Vacunas est??n disponibles en Espa??ol y en muchos otros idiomas. Visite www.immunize.org/vis    1. Why get vaccinated?    Influenza (???flu???) is a contagious disease that spreads around the [place] every year, usually between October and May.     Flu is caused by influenza viruses, and is spread mainly by coughing, sneezing, and close contact.     Anyone can get flu. Flu strikes suddenly and can last several days. Symptoms vary by age, but can include:  ??? fever/chills  ??? sore throat  ??? muscle aches  ??? fatigue  ??? cough  ??? headache   ??? runny or stuffy nose    Flu can also lead to pneumonia and blood infections, and cause diarrhea and seizures in children.  If you have a medical condition, such as heart or lung disease, flu can make it worse.    Flu is more dangerous for some people. Infants and young children, people 65 years of age and older, pregnant women, and people with certain health conditions or a weakened immune system are at greatest risk.      Each year thousands of people in the [place] die from flu, and many more are hospitalized.     Flu vaccine can:  ??? keep you from getting flu,  ??? make flu less severe if you do get it, and  ??? keep you from spreading flu to your family and other people.     2. Inactivated and recombinant flu vaccines    A dose of flu vaccine is recommended every flu season. Children 6 months through 8 years of age may need two doses during the same flu season.  Everyone else needs only one dose each flu season.       Some inactivated flu vaccines contain a very small amount of a mercury-based preservative called thimerosal. Studies have not shown thimerosal in vaccines to be harmful, but flu vaccines that do not contain  thimerosal are available.    There is no live flu virus in flu shots.  They cannot cause the flu.     There are many flu viruses, and they are always changing. Each year a new flu vaccine is made to protect against three or four viruses that are likely to cause disease in the upcoming flu season. But even when the vaccine doesn???t exactly match these viruses, it may still provide some protection    Flu vaccine cannot prevent:  ??? flu that is caused by a virus not covered by the vaccine, or  ??? illnesses that look like flu but are not.    It takes about 2 weeks for protection to develop after vaccination, and protection lasts through the flu season.     3. Some people should not get this vaccine    Tell the person who is giving you the vaccine:    ??? If you have any severe, life-threatening allergies.    If you ever had a life-threatening allergic reaction after a dose of flu vaccine, or have a severe allergy to any part of this vaccine, you may be advised not to get vaccinated.  Most, but not all, types of flu vaccine contain a small amount of egg protein.       ??? If you   ever had Guillain-Barr?? Syndrome (also called GBS).   Some people with a history of GBS should not get this vaccine. This should be discussed with your doctor.    ??? If you are not feeling well.    It is usually okay to get flu vaccine when you have a mild illness, but you might be asked to come back when you feel better.      4. Risks of a vaccine reaction    With any medicine, including vaccines, there is a chance of reactions. These are usually mild and go away on their own, but serious reactions are also possible.     Most people who get a flu shot do not have any problems with it.     Minor problems following a flu shot include:   ??? soreness, redness, or swelling where the shot was given    ??? hoarseness  ??? sore, red or itchy eyes  ??? cough  ??? fever  ??? aches  ??? headache  ??? itching  ??? fatigue   If these problems occur, they usually begin soon after the shot and last 1 or 2 days.     More serious problems following a flu shot can include the following:    ??? There may be a small increased risk of Guillain-Barr?? Syndrome (GBS) after inactivated flu vaccine.  This risk has been estimated at 1 or 2 additional cases per million people vaccinated. This is much lower than the risk of severe complications from flu, which can be prevented by flu vaccine.      ??? Young children who get the flu shot along with pneumococcal vaccine (PCV13) and/or DTaP vaccine at the same time might be slightly more likely to have a seizure caused by fever. Ask your doctor for more information. Tell your doctor if a child who is getting flu vaccine has ever had a seizure.     Problems that could happen after any injected vaccine:     ??? People sometimes faint after a medical procedure, including vaccination. Sitting or lying down for about 15 minutes can help prevent fainting, and injuries caused by a fall. Tell your doctor if you feel dizzy, or have vision changes or ringing in the ears.    ??? Some people get severe pain in the shoulder and have difficulty moving the arm where a shot was given. This happens very rarely.    ??? Any medication can cause a severe allergic reaction. Such reactions from a vaccine are very rare, estimated at about 1 in a million doses, and would happen within a few minutes to a few hours after the vaccination.    As with any medicine, there is a very remote chance of a vaccine causing a serious injury or death.    The safety of vaccines is always being monitored. For more information, visit: www.cdc.gov/vaccinesafety/    5. What if there is a serious reaction?    What should I look for?    ??? Look for anything that concerns you, such as signs of a severe allergic reaction, very high fever, or unusual behavior.    Signs of a severe allergic reaction can include hives, swelling of the  face and throat, difficulty breathing, a fast heartbeat, dizziness, and weakness ??? usually within a few minutes to a few hours after the vaccination.    What should I do?    ??? If you think it is a severe allergic reaction or other emergency that   can???t wait, call 9-1-1 and get the person to the nearest hospital. Otherwise, call your doctor.    ??? Reactions should be reported to the Vaccine Adverse Event Reporting System (VAERS). Your doctor should file this report, or you can do it yourself through  the VAERS web site at www.vaers.hhs.gov, or by calling 1-800-822-7967.    VAERS does not give medical advice.    6. The National Vaccine Injury Compensation Program    The National Vaccine Injury Compensation Program (VICP) is a federal program that was created to compensate people who may have been injured by certain vaccines.    Persons who believe they may have been injured by a vaccine can learn about the program and about filing a claim by calling 1-800-338-2382 or visiting the VICP website at www.hrsa.gov/vaccinecompensation.  There is a time limit to file a claim for compensation.    7. How can I learn more?  ??? Ask your healthcare provider. He or she can give you the vaccine package insert or suggest other sources of information.  ??? Call your local or state health department.  ??? Contact the Centers for Disease Control and Prevention (CDC):  - Call 1-800-232-4636 (1-800-CDC-INFO) or  - Visit CDC???s website at www.cdc.gov/flu    Vaccine Information Statement   Inactivated Influenza Vaccine   01/06/2014  42 [place]C. ?? [id number]    Department of Health and Human Services  Centers for Disease Control and Prevention    Office Use Only

## 2016-03-13 NOTE — Progress Notes (Signed)
Chief Complaint   Patient presents with   ??? Diabetes   ??? Cholesterol Problem     3 mo check  Order placed for High Dose Flu Vac, per Verbal Order from Dr. Raiford Simmondsomah on 03/13/2016 due to need for immunization.  Brittney Shelton is a 65 y.o. female who presents for routine immunizations.   She denies any symptoms , reactions or allergies that would exclude them from being immunized today.  Risks and adverse reactions were discussed and the VIS was given to them. All questions were addressed.  She was observed for 15 min post injection. There were no reactions observed.    Kelby Famynthia P Zeven Kocak, LPN

## 2016-03-13 NOTE — Telephone Encounter (Signed)
Pls call [facility] to clarify directions for Zithromax     Best number to reach them is [phone]

## 2016-03-14 LAB — METABOLIC PANEL, COMPREHENSIVE
A-G Ratio: 1.2 (ref 1.2–2.2)
ALT (SGPT): 9 IU/L (ref 0–32)
AST (SGOT): 18 IU/L (ref 0–40)
Albumin: 4.2 g/dL (ref 3.6–4.8)
Alk. phosphatase: 72 IU/L (ref 39–117)
BUN/Creatinine ratio: 25 (ref 12–28)
BUN: 22 mg/dL (ref 8–27)
Bilirubin, total: 0.7 mg/dL (ref 0.0–1.2)
CO2: 26 mmol/L (ref 18–29)
Calcium: 9.3 mg/dL (ref 8.7–10.3)
Chloride: 98 mmol/L (ref 96–106)
Creatinine: 0.88 mg/dL (ref 0.57–1.00)
GFR est AA: 79 mL/min/{1.73_m2} (ref >59)
GFR est non-AA: 68 mL/min/{1.73_m2} (ref >59)
GLOBULIN, TOTAL: 3.6 g/dL (ref 1.5–4.5)
Glucose: 120 mg/dL — ABNORMAL HIGH (ref 65–99)
Potassium: 4.4 mmol/L (ref 3.5–5.2)
Protein, total: 7.8 g/dL (ref 6.0–8.5)
Sodium: 144 mmol/L (ref 134–144)

## 2016-03-14 LAB — HEMOGLOBIN A1C WITH EAG
Estimated average glucose: 146 mg/dL
Hemoglobin A1c: 6.7 % — ABNORMAL HIGH (ref 4.8–5.6)

## 2016-03-14 LAB — UA/M W/RFLX CULTURE, ROUTINE

## 2016-03-14 LAB — MICROALBUMIN, UR, RAND W/ MICROALB/CREAT RATIO

## 2016-03-19 ENCOUNTER — Encounter

## 2016-03-20 NOTE — Progress Notes (Signed)
Spoke with patient after verifying name and DOB.  Patient is still considering cataract surgery. Will call in November to set up date if she decides to have it. Mammogram scheduled for Dec. 6, 2017.     Goals Addressed     ??? Improve activity tolerance (pt-stated)                  02/19/16 Patient wants to be able walk into PCP office without stopping for break at  appointment in October. Advised patient to slowly increase number of steps, wear oxygen a prescribed and to be sure to have extra tank in case needed. SAJ    02/26/16 Patient wants to weight to be in the 280's and be able to walk to her mailbox by Christmas. This Probation officer asked educated patient on increase walking slowly to build tolerance. She will measure how far she walks inside her home by laps and be able to tell this writer how many she is walking per day since she is unable to at this time. Today's weight is 296.5 pounds. SAJ    03/13/16 Patient reports walking 3 laps per day inside her home. She met goal of walking into providers office for appointment today without stopping. Did not meet goal of weight however her clothes are fitting better. Today's home weight is 297. Advised patient to continue 3 laps until next outreach and will discuss increasing laps. SAJ     03/20/16 Patient has continued 3 laps inside her home. Today's weight is 295. She was able to wash, dry, and fold clothes today.  Patient denies increase in pillow use, SOB. Patient new goal is to walk up the stairs to providers office in June 2018. She will continue 3 laps inside home and add walking the driveway and report to this Probation officer at next outreach. SAJ

## 2016-03-27 NOTE — Progress Notes (Signed)
Spoke with patient after verifying name and DOB.    Goals Addressed     ??? Improve activity tolerance (pt-stated)                  02/19/16 Patient wants to be able walk into PCP office without stopping for break at  appointment in October. Advised patient to slowly increase number of steps, wear oxygen a prescribed and to be sure to have extra tank in case needed. SAJ    02/26/16 Patient wants to weight to be in the 280's and be able to walk to her mailbox by Christmas. This Probation officer asked educated patient on increase walking slowly to build tolerance. She will measure how far she walks inside her home by laps and be able to tell this writer how many she is walking per day since she is unable to at this time. Today's weight is 296.5 pounds. SAJ    03/13/16 Patient reports walking 3 laps per day inside her home. She met goal of walking into providers office for appointment today without stopping. Did not meet goal of weight however her clothes are fitting better. Today's home weight is 297. Advised patient to continue 3 laps until next outreach and will discuss increasing laps. SAJ     03/20/16 Patient has continued 3 laps inside her home. Today's weight is 295. She was able to wash, dry, and fold clothes today.  Patient denies increase in pillow use, SOB. Patient new goal is to walk up the stairs to providers office in June 2018. She will continue 3 laps inside home and add walking the driveway and report to this Probation officer at next outreach. SAJ    03/27/16 Patient reports continued 3 laps in her home. Feeling well weight has remained at 295. 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 Patient will continue 3 laps in home and either add driveway or additional lap in home and report to this Probation officer at next outreach. SAJ

## 2016-04-03 NOTE — Progress Notes (Signed)
Spoke with patient after verifying name and DOB.    Goals Addressed     ??? Improve activity tolerance (pt-stated)                  02/19/16 Patient wants to be able walk into PCP office without stopping for break at  appointment in October. Advised patient to slowly increase number of steps, wear oxygen a prescribed and to be sure to have extra tank in case needed. SAJ    02/26/16 Patient wants to weight to be in the 280's and be able to walk to her mailbox by Christmas. This Probation officer asked educated patient on increase walking slowly to build tolerance. She will measure how far she walks inside her home by laps and be able to tell this writer how many she is walking per day since she is unable to at this time. Today's weight is 296.5 pounds. SAJ    03/13/16 Patient reports walking 3 laps per day inside her home. She met goal of walking into providers office for appointment today without stopping. Did not meet goal of weight however her clothes are fitting better. Today's home weight is 297. Advised patient to continue 3 laps until next outreach and will discuss increasing laps. SAJ     03/20/16 Patient has continued 3 laps inside her home. Today's weight is 295. She was able to wash, dry, and fold clothes today.  Patient denies increase in pillow use, SOB. Patient new goal is to walk up the stairs to providers office in June 2018. She will continue 3 laps inside home and add walking the driveway and report to this Probation officer at next outreach. SAJ    03/27/16 Patient reports continued 3 laps in her home. Feeling well weight has remained at 295. 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 Patient will continue 3 laps in home and either add driveway or additional lap in home and report to this Probation officer at next outreach. SAJ    04/03/16 Patient continues to walk 3 laps inside home. Weight has been stable at 295. Patient reports being able to have conversations with no  SOB. No increase in pillows or sleeping upright. Patient will continue 3 laps in home and either add driveway or additional lap in home and report to this Probation officer at next outreach. SAJ         ??? COMPLETED: Knowledge and adherence medication (ie. action, side effects, missed dose, etc.)                  02/11/16 Will take clonodine 0.83m once a day for elevated BP.  Pt will report symptoms to PCP [facility] within 4 hours.  AFP    02/13/16 Patient reports taking clonidine 0.1 mg 02/12/16 and 02/13/16. Blood pressure 132/80. She has 11 pills left in bottle. SAJ    02/19/16 Patient repots taking Clonidine 0.1 mg daily as prescribed. SAJ     02/26/16 Patient has refilled Clonidine and reports taking daily. Has picked up Lasix from pharmacy. Blood pressure today is 124/81. SAJ

## 2016-04-09 NOTE — Progress Notes (Signed)
Spoke with patient after verifying name and DOB.    Goals     ??? Improve activity tolerance (pt-stated)            02/19/16 Patient wants to be able walk into PCP office without stopping for break at  appointment in October. Advised patient to slowly increase number of steps, wear oxygen a prescribed and to be sure to have extra tank in case needed. SAJ    02/26/16 Patient wants to weight to be in the 280's and be able to walk to her mailbox by Christmas. This Probation officer asked educated patient on increase walking slowly to build tolerance. She will measure how far she walks inside her home by laps and be able to tell this writer how many she is walking per day since she is unable to at this time. Today's weight is 296.5 pounds. SAJ    03/13/16 Patient reports walking 3 laps per day inside her home. She met goal of walking into providers office for appointment today without stopping. Did not meet goal of weight however her clothes are fitting better. Today's home weight is 297. Advised patient to continue 3 laps until next outreach and will discuss increasing laps. SAJ     03/20/16 Patient has continued 3 laps inside her home. Today's weight is 295. She was able to wash, dry, and fold clothes today.  Patient denies increase in pillow use, SOB. Patient new goal is to walk up the stairs to providers office in June 2018. She will continue 3 laps inside home and add walking the driveway and report to this Probation officer at next outreach. SAJ    03/27/16 Patient reports continued 3 laps in her home. Feeling well weight has remained at 295. 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 Patient will continue 3 laps in home and either add driveway or additional lap in home and report to this Probation officer at next outreach. SAJ    04/03/16 Patient continues to walk 3 laps inside home. Weight has been stable at 295. Patient reports being able to have conversations with no  SOB. No increase in pillows or sleeping upright. Patient will continue 3 laps in home and either add driveway or additional lap in home and report to this Probation officer at next outreach. SAJ    04/09/16 Patient continues to walk 3 lapsinside her home due to arthritis in foot. Reports weight as 293. No increase in pillow use or sleeping upright. Continues to limit fluids and sodium and using portion control. Patient will continue 3 laps in home and either add driveway or additional lap in home and report to this Probation officer at next outreach. SAJ

## 2016-04-16 NOTE — Progress Notes (Signed)
Spoke with patient after verifying name and DOB.    Goals     ??? Improve activity tolerance (pt-stated)            02/19/16 Patient wants to be able walk into PCP office without stopping for break at  appointment in October. Advised patient to slowly increase number of steps, wear oxygen a prescribed and to be sure to have extra tank in case needed. SAJ    02/26/16 Patient wants to weight to be in the 280's and be able to walk to her mailbox by Christmas. This Probation officer asked educated patient on increase walking slowly to build tolerance. She will measure how far she walks inside her home by laps and be able to tell this writer how many she is walking per day since she is unable to at this time. Today's weight is 296.5 pounds. SAJ    03/13/16 Patient reports walking 3 laps per day inside her home. She met goal of walking into providers office for appointment today without stopping. Did not meet goal of weight however her clothes are fitting better. Today's home weight is 297. Advised patient to continue 3 laps until next outreach and will discuss increasing laps. SAJ     03/20/16 Patient has continued 3 laps inside her home. Today's weight is 295. She was able to wash, dry, and fold clothes today.  Patient denies increase in pillow use, SOB. Patient new goal is to walk up the stairs to providers office in June 2018. She will continue 3 laps inside home and add walking the driveway and report to this Probation officer at next outreach. SAJ    03/27/16 Patient reports continued 3 laps in her home. Feeling well weight has remained at 295. 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 Patient will continue 3 laps in home and either add driveway or additional lap in home and report to this Probation officer at next outreach. SAJ    04/03/16 Patient continues to walk 3 laps inside home. Weight has been stable at 295. Patient reports being able to have conversations with no  SOB. No increase in pillows or sleeping upright. Patient will continue 3 laps in home and either add driveway or additional lap in home and report to this Probation officer at next outreach. SAJ    04/09/16 Patient continues to walk 3 lapsinside her home due to arthritis in foot. Reports weight as 293. No increase in pillow use or sleeping upright. Continues to limit fluids and sodium and using portion control. Patient will continue 3 laps in home and either add driveway or additional lap in home and report to this Probation officer at next outreach. SAJ    04/15/16 Patient reports weight ar 293 and she is walking 4.5 laps in her home. Continues to limit fluids, sodium and using portion control. Denies swelling, increase in pillows or sleeping upright. She has plans with family this weekend.  Patient will continue walking 4.5 laps or more and report to this writer at next out reach. SAJ

## 2016-04-22 NOTE — Progress Notes (Signed)
Spoke with patient after verifying name and DOB.    Goals Addressed     ??? Improve activity tolerance (pt-stated)                  02/19/16 Patient wants to be able walk into PCP office without stopping for break at  appointment in October. Advised patient to slowly increase number of steps, wear oxygen a prescribed and to be sure to have extra tank in case needed. SAJ    02/26/16 Patient wants to weight to be in the 280's and be able to walk to her mailbox by Christmas. This Probation officer asked educated patient on increase walking slowly to build tolerance. She will measure how far she walks inside her home by laps and be able to tell this writer how many she is walking per day since she is unable to at this time. Today's weight is 296.5 pounds. SAJ    03/13/16 Patient reports walking 3 laps per day inside her home. She met goal of walking into providers office for appointment today without stopping. Did not meet goal of weight however her clothes are fitting better. Today's home weight is 297. Advised patient to continue 3 laps until next outreach and will discuss increasing laps. SAJ     03/20/16 Patient has continued 3 laps inside her home. Today's weight is 295. She was able to wash, dry, and fold clothes today.  Patient denies increase in pillow use, SOB. Patient new goal is to walk up the stairs to providers office in June 2018. She will continue 3 laps inside home and add walking the driveway and report to this Probation officer at next outreach. SAJ    03/27/16 Patient reports continued 3 laps in her home. Feeling well weight has remained at 295. 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 Patient will continue 3 laps in home and either add driveway or additional lap in home and report to this Probation officer at next outreach. SAJ    04/03/16 Patient continues to walk 3 laps inside home. Weight has been stable at 295. Patient reports being able to have conversations with no  SOB. No increase in pillows or sleeping upright. Patient will continue 3 laps in home and either add driveway or additional lap in home and report to this Probation officer at next outreach. SAJ    04/09/16 Patient continues to walk 3 lapsinside her home due to arthritis in foot. Reports weight as 293. No increase in pillow use or sleeping upright. Continues to limit fluids and sodium and using portion control. Patient will continue 3 laps in home and either add driveway or additional lap in home and report to this Probation officer at next outreach. SAJ    04/15/16 Patient reports weight ar 293 and she is walking 4.5 laps in her home. Continues to limit fluids, sodium and using portion control. Denies swelling, increase in pillows or sleeping upright. She has plans with family this weekend.  Patient will continue walking 4.5 laps or more and report to this writer at next out reach. SAJ    04/22/16 Patient reports weight at 295 due to increase in food intake at family outing. Denies SOB, swelling, chest pain,increase in pillow use able to speak in complete sentences. She continues to walk to walk 4.5 laps in home and also shopped this pass weekend. Patient will call cardiology if increase of 3 pounds in a day or 5 pounds in a week. SAJ

## 2016-04-29 NOTE — Progress Notes (Signed)
Unable to reach patient This Probation officer contact information left on voicemail.     04/30/16 received message from patient requesting call back. Contact number is [phone]. Spoke with patient after verifying name and DOB.    Goals     ??? Improve activity tolerance (pt-stated)            02/19/16 Patient wants to be able walk into PCP office without stopping for break at  appointment in October. Advised patient to slowly increase number of steps, wear oxygen a prescribed and to be sure to have extra tank in case needed. SAJ    02/26/16 Patient wants to weight to be in the 280's and be able to walk to her mailbox by Christmas. This Probation officer asked educated patient on increase walking slowly to build tolerance. She will measure how far she walks inside her home by laps and be able to tell this writer how many she is walking per day since she is unable to at this time. Today's weight is 296.5 pounds. SAJ    03/13/16 Patient reports walking 3 laps per day inside her home. She met goal of walking into providers office for appointment today without stopping. Did not meet goal of weight however her clothes are fitting better. Today's home weight is 297. Advised patient to continue 3 laps until next outreach and will discuss increasing laps. SAJ     03/20/16 Patient has continued 3 laps inside her home. Today's weight is 295. She was able to wash, dry, and fold clothes today.  Patient denies increase in pillow use, SOB. Patient new goal is to walk up the stairs to providers office in June 2018. She will continue 3 laps inside home and add walking the driveway and report to this Probation officer at next outreach. SAJ    03/27/16 Patient reports continued 3 laps in her home. Feeling well weight has remained at 295. 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 Patient will continue 3 laps in home and either add driveway or additional lap in home and report to this Probation officer at next outreach. SAJ     04/03/16 Patient continues to walk 3 laps inside home. Weight has been stable at 295. Patient reports being able to have conversations with no SOB. No increase in pillows or sleeping upright. Patient will continue 3 laps in home and either add driveway or additional lap in home and report to this Probation officer at next outreach. SAJ    04/09/16 Patient continues to walk 3 lapsinside her home due to arthritis in foot. Reports weight as 293. No increase in pillow use or sleeping upright. Continues to limit fluids and sodium and using portion control. Patient will continue 3 laps in home and either add driveway or additional lap in home and report to this Probation officer at next outreach. SAJ    04/15/16 Patient reports weight ar 293 and she is walking 4.5 laps in her home. Continues to limit fluids, sodium and using portion control. Denies swelling, increase in pillows or sleeping upright. She has plans with family this weekend.  Patient will continue walking 4.5 laps or more and report to this writer at next out reach. SAJ    04/22/16 Patient reports weight at 295 due to increase in food intake at family outing. Denies SOB, swelling, chest pain,increase in pillow use able to speak in complete sentences. She continues to walk to walk 4.5 laps in home and also shopped this pass  weekend. Patient will call cardiology if increase of 3 pounds in a day or 5 pounds in a week. SAJ     04/30/16 Patient reports weight of 297. She did not follow fluid restrictions for a few days. She is now back to fluid restriction. Following diet. Denies SOB or feeling tired. Speaking in complete sentences. She is currently walking 5 laps inside home and a few outside of home. Patient will restrict fluid intake and maintain current activity. Report to Probation officer at next outreach. SAJ

## 2016-04-30 ENCOUNTER — Encounter: Attending: Cardiovascular Disease | Primary: Internal Medicine

## 2016-05-07 ENCOUNTER — Ambulatory Visit: Payer: MEDICARE | Primary: Internal Medicine

## 2016-05-07 NOTE — Progress Notes (Signed)
Spoke with patient after verifying name and DOB.  Goals     ??? Improve activity tolerance (pt-stated)            02/19/16 Patient wants to be able walk into PCP office without stopping for break at  appointment in October. Advised patient to slowly increase number of steps, wear oxygen a prescribed and to be sure to have extra tank in case needed. SAJ    02/26/16 Patient wants to weight to be in the 280's and be able to walk to her mailbox by Christmas. This Probation officer asked educated patient on increase walking slowly to build tolerance. She will measure how far she walks inside her home by laps and be able to tell this writer how many she is walking per day since she is unable to at this time. Today's weight is 296.5 pounds. SAJ    03/13/16 Patient reports walking 3 laps per day inside her home. She met goal of walking into providers office for appointment today without stopping. Did not meet goal of weight however her clothes are fitting better. Today's home weight is 297. Advised patient to continue 3 laps until next outreach and will discuss increasing laps. SAJ     03/20/16 Patient has continued 3 laps inside her home. Today's weight is 295. She was able to wash, dry, and fold clothes today.  Patient denies increase in pillow use, SOB. Patient new goal is to walk up the stairs to providers office in June 2018. She will continue 3 laps inside home and add walking the driveway and report to this Probation officer at next outreach. SAJ    03/27/16 Patient reports continued 3 laps in her home. Feeling well weight has remained at 295. 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 Patient will continue 3 laps in home and either add driveway or additional lap in home and report to this Probation officer at next outreach. SAJ    04/03/16 Patient continues to walk 3 laps inside home. Weight has been stable at 295. Patient reports being able to have conversations with no  SOB. No increase in pillows or sleeping upright. Patient will continue 3 laps in home and either add driveway or additional lap in home and report to this Probation officer at next outreach. SAJ    04/09/16 Patient continues to walk 3 lapsinside her home due to arthritis in foot. Reports weight as 293. No increase in pillow use or sleeping upright. Continues to limit fluids and sodium and using portion control. Patient will continue 3 laps in home and either add driveway or additional lap in home and report to this Probation officer at next outreach. SAJ    04/15/16 Patient reports weight ar 293 and she is walking 4.5 laps in her home. Continues to limit fluids, sodium and using portion control. Denies swelling, increase in pillows or sleeping upright. She has plans with family this weekend.  Patient will continue walking 4.5 laps or more and report to this writer at next out reach. SAJ    04/22/16 Patient reports weight at 295 due to increase in food intake at family outing. Denies SOB, swelling, chest pain,increase in pillow use able to speak in complete sentences. She continues to walk to walk 4.5 laps in home and also shopped this pass weekend. Patient will call cardiology if increase of 3 pounds in a day or 5 pounds in a week. SAJ     04/30/16 Patient reports weight of 297. She  did not follow fluid restrictions for a few days. She is now back to fluid restriction. Following diet. Denies SOB or feeling tired. Speaking in complete sentences. She is currently walking 5 laps inside home and a few outside of home. Patient will restrict fluid intake and maintain current activity. Report to Probation officer at next outreach. SAJ    05/07/16 Patient report felling well weight stable no gain. Adherence to diet and fluid restriction. Able to shop in [place] with no issues. Denies sleeping upright. Patient abe to speak in complete sentences. Patient will continue laps in home and report at next outreach.  SAJ

## 2016-05-11 ENCOUNTER — Encounter

## 2016-05-13 MED ORDER — LISINOPRIL 40 MG TAB
40 mg | ORAL_TABLET | ORAL | 3 refills | Status: DC
Start: 2016-05-13 — End: 2017-01-07

## 2016-05-13 MED ORDER — ISOSORBIDE DINITRATE 30 MG TAB
30 mg | ORAL_TABLET | ORAL | 3 refills | Status: DC
Start: 2016-05-13 — End: 2017-05-14

## 2016-05-14 NOTE — Progress Notes (Signed)
Unable to reach patient. This Probation officer contact information left on voicemail.     05/14/16 1208 received message from patient requesting a return call.  Goals     ??? Improve activity tolerance (pt-stated)            02/19/16 Patient wants to be able walk into PCP office without stopping for break at  appointment in October. Advised patient to slowly increase number of steps, wear oxygen a prescribed and to be sure to have extra tank in case needed. SAJ    02/26/16 Patient wants to weight to be in the 280's and be able to walk to her mailbox by Christmas. This Probation officer asked educated patient on increase walking slowly to build tolerance. She will measure how far she walks inside her home by laps and be able to tell this writer how many she is walking per day since she is unable to at this time. Today's weight is 296.5 pounds. SAJ    03/13/16 Patient reports walking 3 laps per day inside her home. She met goal of walking into providers office for appointment today without stopping. Did not meet goal of weight however her clothes are fitting better. Today's home weight is 297. Advised patient to continue 3 laps until next outreach and will discuss increasing laps. SAJ     03/20/16 Patient has continued 3 laps inside her home. Today's weight is 295. She was able to wash, dry, and fold clothes today.  Patient denies increase in pillow use, SOB. Patient new goal is to walk up the stairs to providers office in June 2018. She will continue 3 laps inside home and add walking the driveway and report to this Probation officer at next outreach. SAJ    03/27/16 Patient reports continued 3 laps in her home. Feeling well weight has remained at 295. 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 Patient will continue 3 laps in home and either add driveway or additional lap in home and report to this Probation officer at next outreach. SAJ    04/03/16 Patient continues to walk 3 laps inside home. Weight has been  stable at 295. Patient reports being able to have conversations with no SOB. No increase in pillows or sleeping upright. Patient will continue 3 laps in home and either add driveway or additional lap in home and report to this Probation officer at next outreach. SAJ    04/09/16 Patient continues to walk 3 lapsinside her home due to arthritis in foot. Reports weight as 293. No increase in pillow use or sleeping upright. Continues to limit fluids and sodium and using portion control. Patient will continue 3 laps in home and either add driveway or additional lap in home and report to this Probation officer at next outreach. SAJ    04/15/16 Patient reports weight ar 293 and she is walking 4.5 laps in her home. Continues to limit fluids, sodium and using portion control. Denies swelling, increase in pillows or sleeping upright. She has plans with family this weekend.  Patient will continue walking 4.5 laps or more and report to this writer at next out reach. SAJ    04/22/16 Patient reports weight at 295 due to increase in food intake at family outing. Denies SOB, swelling, chest pain,increase in pillow use able to speak in complete sentences. She continues to walk to walk 4.5 laps in home and also shopped this pass weekend. Patient will call cardiology if increase of 3 pounds in a day or  5 pounds in a week. SAJ     04/30/16 Patient reports weight of 297. She did not follow fluid restrictions for a few days. She is now back to fluid restriction. Following diet. Denies SOB or feeling tired. Speaking in complete sentences. She is currently walking 5 laps inside home and a few outside of home. Patient will restrict fluid intake and maintain current activity. Report to Probation officer at next outreach. SAJ    05/07/16 Patient report felling well weight stable no gain. Adherence to diet and fluid restriction. Able to shop in [place] with no issues. Denies sleeping upright. Patient abe to speak in complete sentences.  Patient will continue laps in home and report at next outreach.  SAJ     05/14/16 Patient reports weight as 298. No gain. Adherence to fluid restriction. Patient reports walking 7 laps in her home increased by 2 laps speaking on complete senrences. Patient will maintain 7 laps and report to writer at next outreach in 2 weeks. SAJ

## 2016-05-18 ENCOUNTER — Encounter

## 2016-05-18 MED ORDER — FUROSEMIDE 40 MG TAB
40 mg | ORAL_TABLET | ORAL | 0 refills | Status: DC
Start: 2016-05-18 — End: 2016-08-14

## 2016-05-21 ENCOUNTER — Ambulatory Visit
Admit: 2016-05-21 | Discharge: 2016-05-21 | Payer: MEDICARE | Attending: Cardiovascular Disease | Primary: Internal Medicine

## 2016-05-21 ENCOUNTER — Encounter: Attending: Cardiovascular Disease | Primary: Internal Medicine

## 2016-05-21 DIAGNOSIS — E7800 Pure hypercholesterolemia, unspecified: Secondary | ICD-10-CM

## 2016-05-21 NOTE — Progress Notes (Signed)
Chief Complaint   Patient presents with   ??? Establish Care     pt c/o swelling ankle bilateral.oxygen present pt states she is on 4 liters. thigh cuff used for arm blood pressure reading today.     1. Have you been to the ER, urgent care clinic since your last visit?  Hospitalized since your last visit?No    2. Have you seen or consulted any other health care providers outside of the [facility] since your last visit?  Include any pap smears or colon screening. No

## 2016-05-21 NOTE — Progress Notes (Signed)
Abigail MiyamotoBrian Fredricka Kohrs, MD          NAME:  Brittney LimesMary S Colter   DOB:   09-May-1951   MRN:   [id number]   PCP:  Erenest RasherNau D Domah, MD    ??????    Subjective:     The patient is a 65 y.o. year old female  who returns for a routine follow-up. Since the last visit, patient reports no change in exercise tolerance, chest pain, edema, medication intolerance, palpitations, shortness of breath, PND/orthopnea wheezing, sputum, syncope, dizziness or light headedness. Doing well.    Past Medical History:   Diagnosis Date   ??? Chronic obstructive pulmonary disease (HCC)    ??? Congestive heart failure (HCC)    ??? Diabetes (HCC)    ??? Hypertension         ICD-10-CM ICD-9-CM    1. Hypercholesterolemia E78.00 272.0 AMB POC EKG ROUTINE W/ 12 LEADS, INTER & REP   2. Diastolic CHF, chronic (HCC) I50.32 428.32      428.0    3. Essential hypertension I10 401.9       Social History   Substance Use Topics   ??? Smoking status: Never Smoker   ??? Smokeless tobacco: Never Used   ??? Alcohol use No      Family History   Problem Relation Age of Onset   ??? Diabetes Mother    ??? Arthritis-osteo Father    ??? COPD Sister    ??? Diabetes Brother    ??? Diabetes Brother         Review of Systems  Cardiovascular: Negative except as noted in HPI      Objective:       Vitals:    05/21/16 1507 05/21/16 1515   BP: 158/84 160/84   Pulse: 70    Resp: 16    SpO2: 91%    Weight: 297 lb (134.7 kg)    Height: 5\' 9"  (1.753 m)     Body mass index is 43.86 kg/(m^2).      General??PE  Mental Status??- Alert. General Appearance??- Not in acute distress.    Chest and Lung Exam??  Inspection:??Accessory muscles??- No use of accessory muscles in breathing.  Auscultation:??  Breath sounds:??- Normal.    Cardiovascular??  Inspection:??Jugular vein??- Bilateral??- Inspection Normal.  Palpation/Percussion:??  Apical Impulse:??- Normal.  Auscultation:??Rhythm??- Regular. Heart Sounds??- S1 WNL and S2 WNL. No S3 or S4.  Murmurs & Other Heart Sounds:??Auscultation of the heart reveals - No Murmurs.    Peripheral Vascular??   Upper Extremity:??Inspection??- Bilateral??- No Cyanotic nailbeds or Digital clubbing.  Lower Extremity:??  Palpation:??Edema??- Bilateral??- No edema.        Data Review:     EKG -  EKG: normal EKG, normal sinus rhythm, unchanged from previous tracings.    LABS- @brieflabs @      Allergies reviewed  No Known Allergies    Medications reviewed  Current Outpatient Prescriptions   Medication Sig   ??? furosemide (LASIX) 40 mg tablet TAKE 1 TABLET BY MOUTH TWICE DAILY FOR SWELLING   ??? isosorbide dinitrate (ISORDIL) 30 mg tablet TAKE 1 TABLET BY MOUTH THREE TIMES DAILY   ??? lisinopril (PRINIVIL, ZESTRIL) 40 mg tablet TAKE 1 TABLET BY MOUTH ONCE DAILY   ??? metoprolol succinate (TOPROL-XL) 200 mg XL tablet TAKE 1 TABLET BY MOUTH ONCE DAILY FOR HIGH BLOOD PRESSURE   ??? cloNIDine HCl (CATAPRES) 0.1 mg tablet TAKE 1 TABLET BY MOUTH ONCE DAILY   ??? umeclidinium-vilanterol (ANORO ELLIPTA) 62.5-25 mcg/actuation inhaler Take  1 Puff by inhalation daily. Indications: BRONCHOSPASM PREVENTION WITH COPD   ??? OXYGEN-AIR DELIVERY SYSTEMS Take 4 L by inhalation continuous.   ??? naproxen sodium (ALEVE) 220 mg tablet Take 440 mg by mouth every twelve (12) hours as needed for Pain.   ??? cholecalciferol, VITAMIN D3, (VITAMIN D3) 5,000 unit tab tablet Take 1 Tab by mouth daily.   ??? pravastatin (PRAVACHOL) 20 mg tablet TAKE 1 TABLET BY MOUTH ONCE DAILY AT NIGHT   ??? amLODIPine (NORVASC) 10 mg tablet TAKE 1 TABLET BY MOUTH DAILY   ??? albuterol-ipratropium (DUO-NEB) 2.5 mg-0.5 mg/3 ml nebu Take 3 mL by inhalation three (3) times daily.   ??? albuterol (PROVENTIL HFA, VENTOLIN HFA, PROAIR HFA) 90 mcg/actuation inhaler Take 1 Puff by inhalation every six (6) hours as needed for Wheezing.   ??? aspirin 81 mg chewable tablet Take 81 mg by mouth daily.   ??? chlorpheniramine-HYDROcodone (TUSSIONEX) 10-8 mg/5 mL suspension Take 5 mL by mouth every twelve (12) hours as needed for Cough. Max Daily Amount: 10 mL. Indications: Cough      No current facility-administered medications for this visit.          Assessment:       ICD-10-CM ICD-9-CM    1. Hypercholesterolemia E78.00 272.0 AMB POC EKG ROUTINE W/ 12 LEADS, INTER & REP   2. Diastolic CHF, chronic (HCC) I50.32 428.32      428.0    3. Essential hypertension I10 401.9         Orders Placed This Encounter   ??? AMB POC EKG ROUTINE W/ 12 LEADS, INTER & REP     Order Specific Question:   Reason for Exam:     Answer:   routine       Plan:     Breathing at baseline.  Weight stable.  BP reasonable for her.  Cont Rx; f/u 3 mo.    Victory DakinBrian L Ameir Faria, MD

## 2016-05-28 NOTE — Progress Notes (Signed)
Unable to reach patient. This writer contact information left on voicemail.

## 2016-06-06 NOTE — Progress Notes (Signed)
Spoke with patient after verifying name and DOB. Patient reports to this writer she is not feeling well today. Her arthritis is acting up with the weather. She is using pain medication with some relief but stay off ankles is best for now. She is currently in bed and has not weighed for today however weight has been stable. She is scheduled to see Dr. Raiford Simmondsomah on 06/13/16.

## 2016-06-09 IMAGING — MG MAMMOGRAPHY SCREENING BILATERAL 3D TOMOSYNTHESIS WITH CAD
12 series · 12 of 28 positions shown · non-contrast
Comparison: 05/03/2015
BREAST DENSITY: (Level B) There are scattered areas of fibroglandular density.

MAMMOGRAPHY SCREENING BILATERAL 3D TOMOSYNTHESIS WITH CAD, 06/09/2016 [DATE]:
CLINICAL INDICATION: Screening.
TECHNIQUE: Bilateral oblique mediolateral and craniocaudal full field digital
mammogram and 3-D Tomosynthesis were obtained.  In addition, computer-aided
detection was utilized.

[R CC synth-2D]
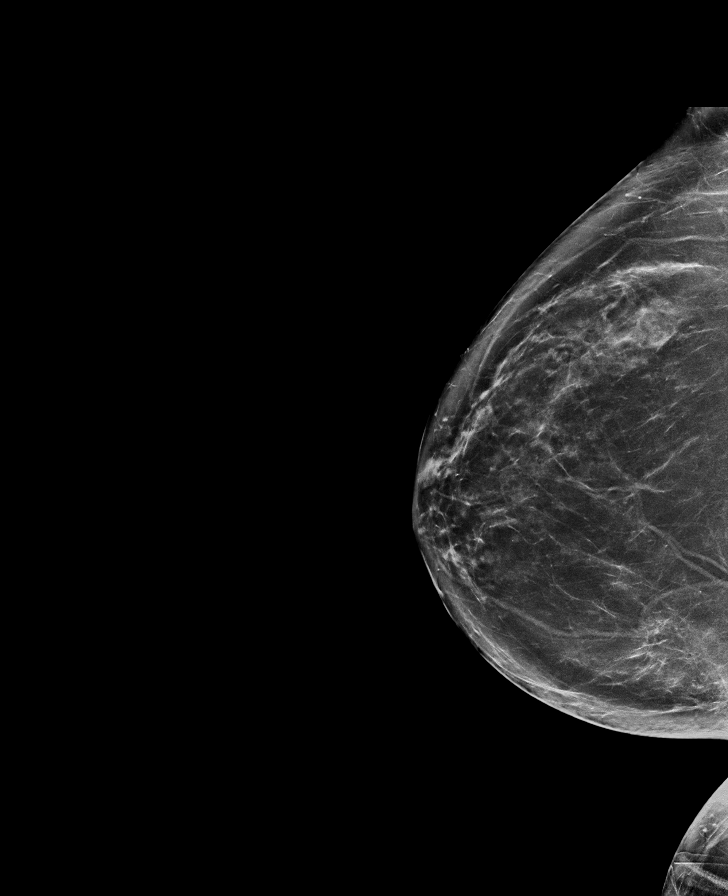

[R MLO synth-2D]
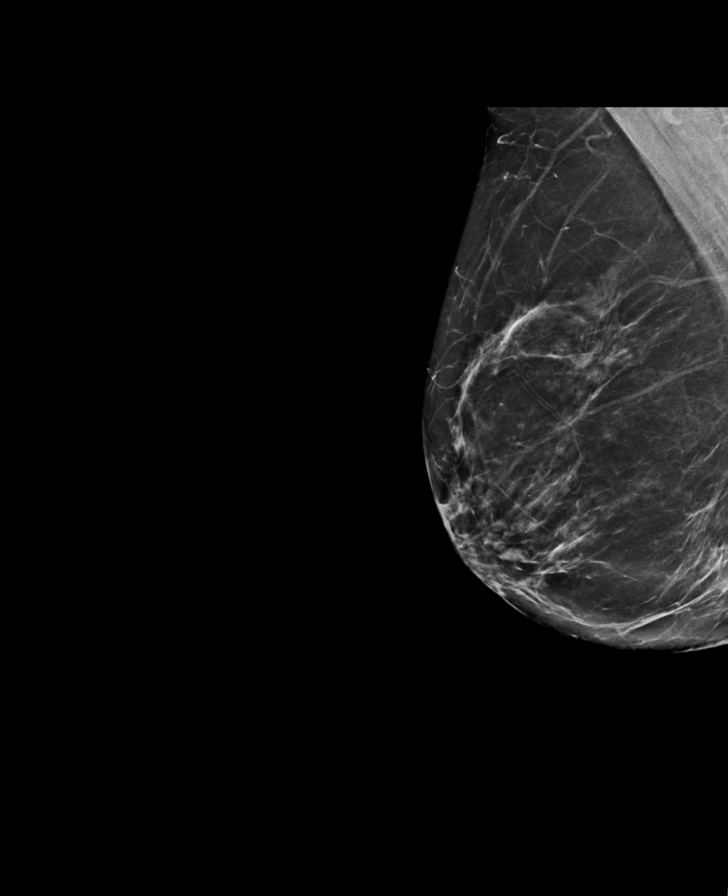

[L CC synth-2D]
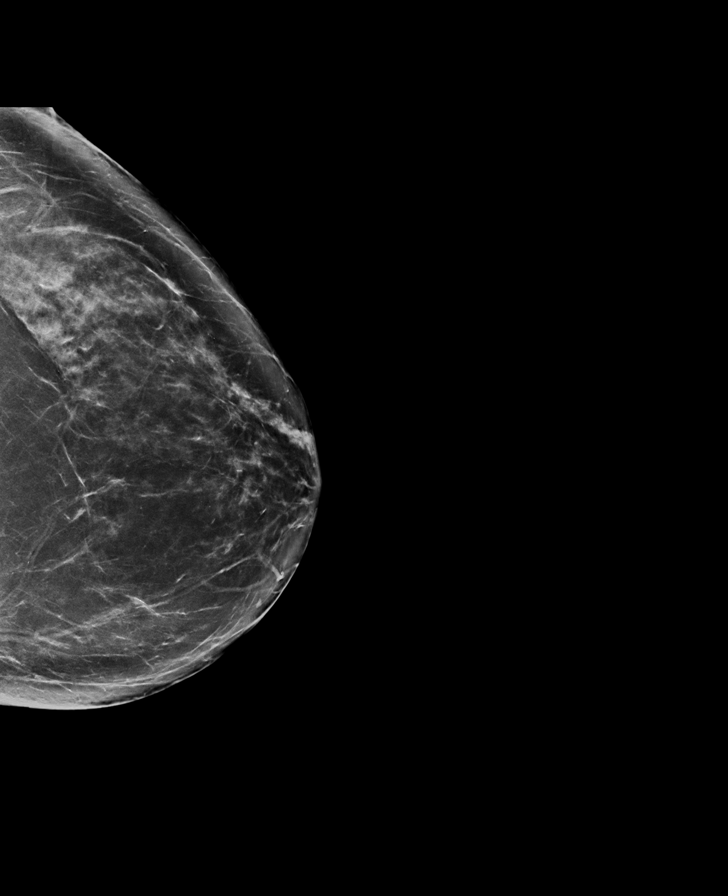

[L MLO synth-2D]
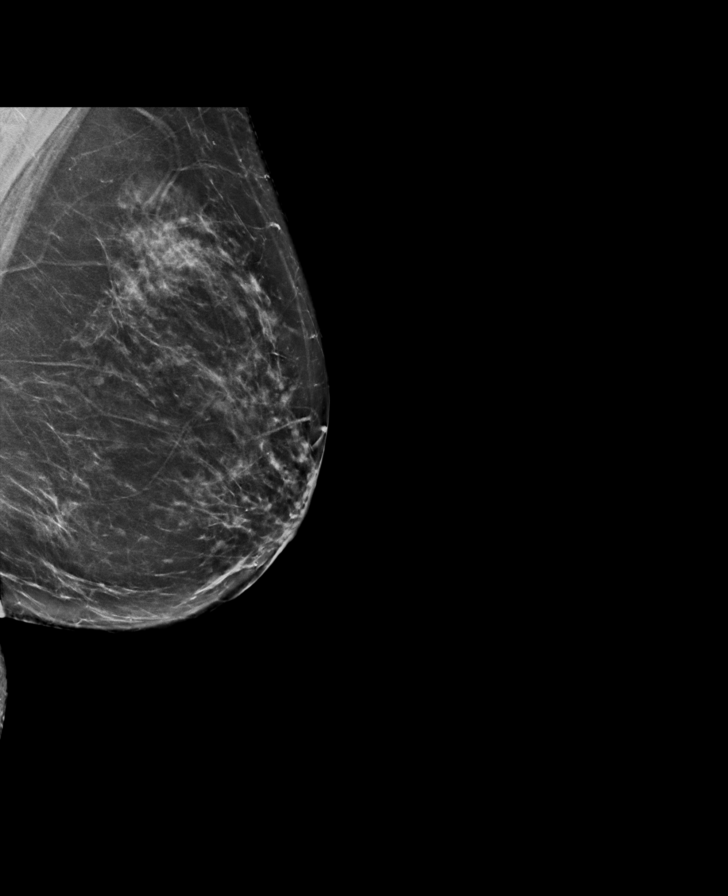

[L MLO tomo (1 of 2)]
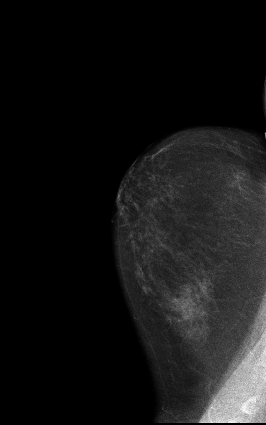

[R CC tomo (1 of 2)]
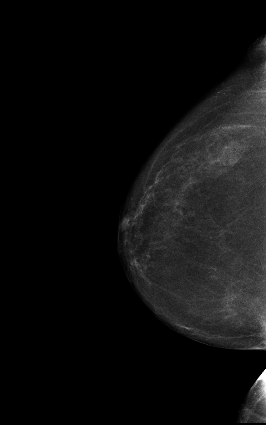

[R MLO tomo (1 of 2)]
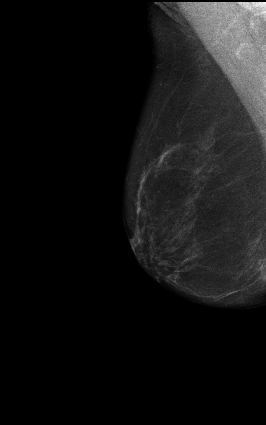

[L CC tomo (1 of 2)]
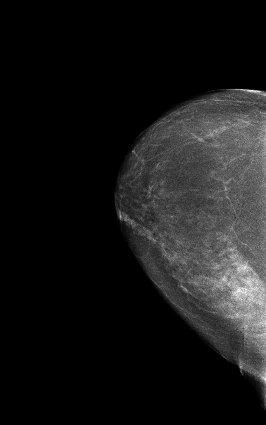

[R MLO tomo (2 of 2) · tomo slice 36/71.0]
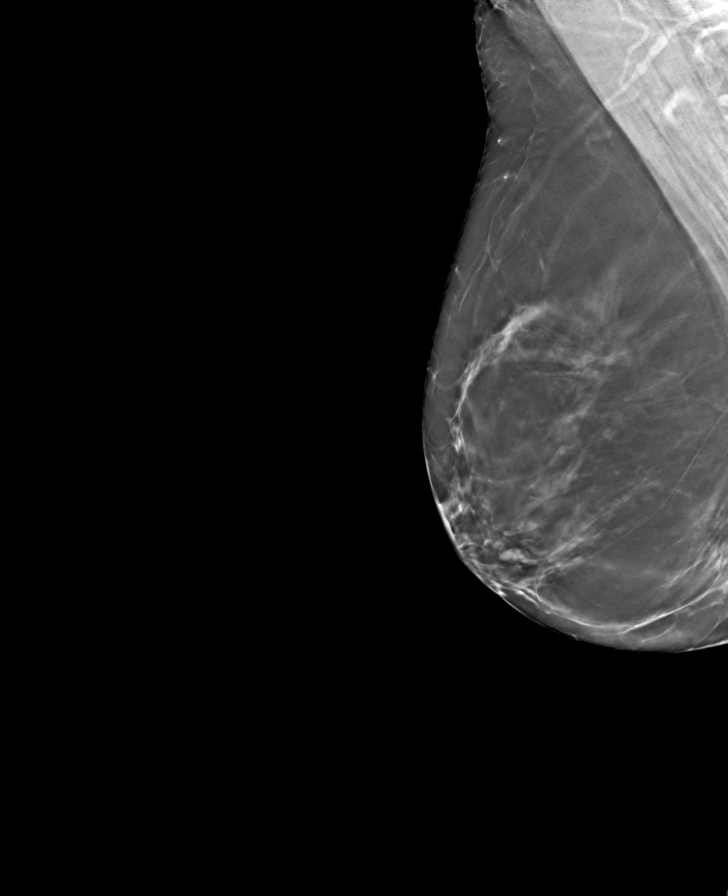

[L MLO tomo (2 of 2) · tomo slice 37/74.0]
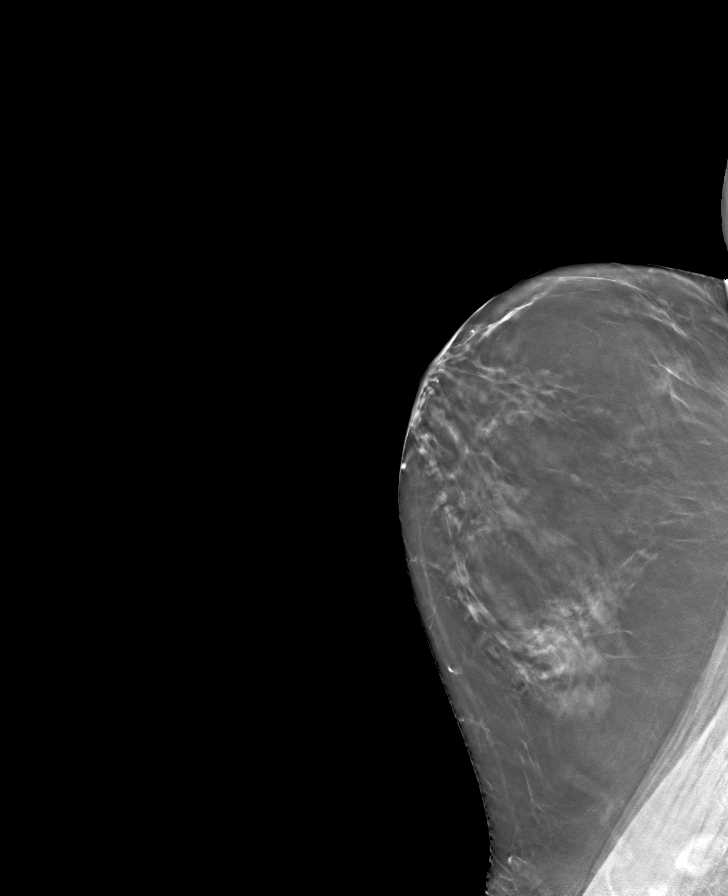

[R CC tomo (2 of 2) · tomo slice 39/76.0]
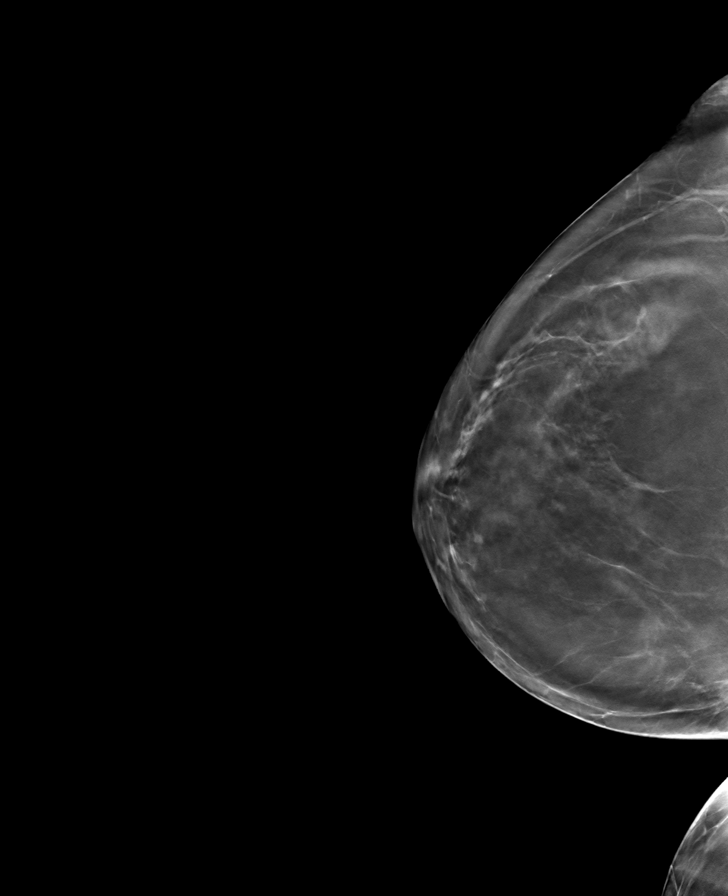

[L CC tomo (2 of 2) · tomo slice 40/79.0]
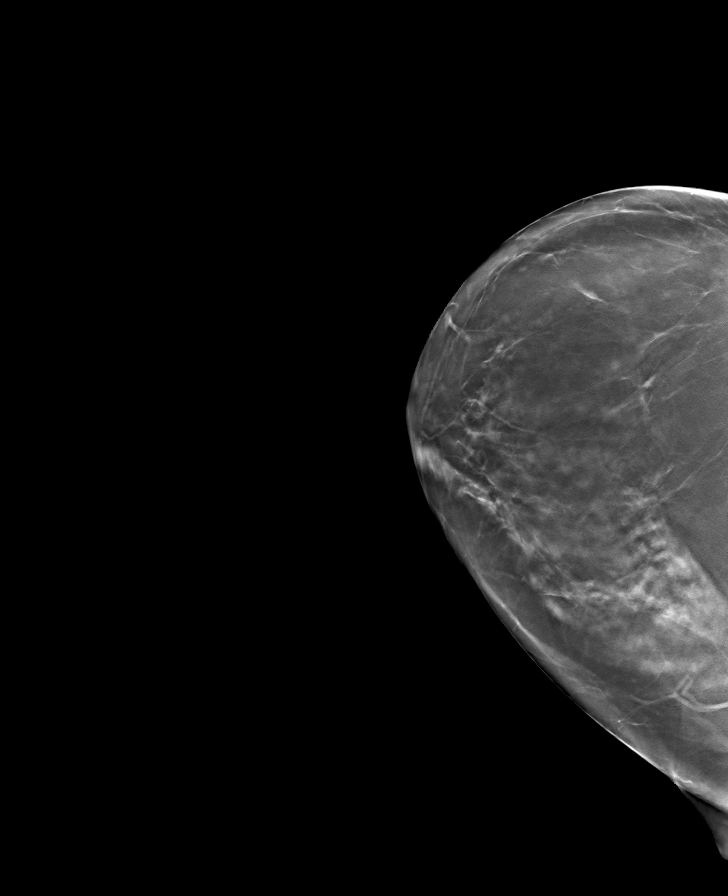

[12 of 28 positions shown; findings below may reference images not displayed]

FINDINGS: Mild asymmetrical increased density is present in the breast
parenchyma in the upper outer quadrant left breast, unchanged.
IMPRESSION: ( BI-RADS 2) Benign findings. Routine mammographic follow-up is recommended.

## 2016-06-09 NOTE — Progress Notes (Signed)
Spoke with patient after verifying name and DOB. Patient reports feeling like she did when she had PNA. Oxygen level remains the same.Denies SOB, chest pain Unable to lay on right side. Patient has appointment scheduled for later this week. Patient offered appointment today and dispatch health. Patient refused at this time. Patient advised if symptoms worsen to call 911.

## 2016-06-11 ENCOUNTER — Ambulatory Visit: Admit: 2016-06-12 | Discharge: 2016-06-12 | Payer: MEDICARE | Attending: Internal Medicine | Primary: Internal Medicine

## 2016-06-11 ENCOUNTER — Encounter

## 2016-06-11 ENCOUNTER — Encounter: Attending: Internal Medicine | Primary: Internal Medicine

## 2016-06-11 DIAGNOSIS — R109 Unspecified abdominal pain: Secondary | ICD-10-CM

## 2016-06-11 LAB — AMB POC URINALYSIS DIP STICK AUTO W/O MICRO
Bilirubin (UA POC): NEGATIVE
Blood (UA POC): NEGATIVE
Glucose (UA POC): NEGATIVE
Ketones (UA POC): NEGATIVE
Leukocyte esterase (UA POC): NEGATIVE
Nitrites (UA POC): NEGATIVE
Protein (UA POC): NEGATIVE
Specific gravity (UA POC): 1.015 (ref 1.001–1.035)
Urobilinogen (UA POC): 0.2 (ref 0.2–1)
pH (UA POC): 7 (ref 4.6–8.0)

## 2016-06-11 MED ORDER — ACETAMINOPHEN-CODEINE 300 MG-30 MG TAB
300-30 mg | ORAL_TABLET | ORAL | 0 refills | Status: DC | PRN
Start: 2016-06-11 — End: 2016-12-10

## 2016-06-11 NOTE — Progress Notes (Signed)
Chief Complaint   Patient presents with   ??? Physical     HPI:  Brittney Shelton is a 66 y.o. AA female with significant pmhx listed below presents with left sided abdominal pain radiating to left groin for a week. Pain is worse with movement, rate 5-6/10, Denies fall, injury, urinary symptoms, fever/chills or cough. Patient has copd, on home oxygen that is currently stable. Breathing regularly on 4L.    Review of Systems  Pertinent items are noted in hpi    Past Medical History:   Diagnosis Date   ??? Chronic obstructive pulmonary disease (HCC)    ??? Congestive heart failure (HCC)    ??? Diabetes (HCC)    ??? Hypertension      History reviewed. No pertinent surgical history.  Social History     Social History   ??? Marital status: SINGLE     Spouse name: N/A   ??? Number of children: N/A   ??? Years of education: N/A     Social History Main Topics   ??? Smoking status: Never Smoker   ??? Smokeless tobacco: Never Used   ??? Alcohol use No   ??? Drug use: No   ??? Sexual activity: No     Other Topics Concern   ??? None     Social History Narrative     Current Outpatient Prescriptions   Medication Sig Dispense Refill   ??? furosemide (LASIX) 40 mg tablet TAKE 1 TABLET BY MOUTH TWICE DAILY FOR SWELLING 180 Tab 0   ??? isosorbide dinitrate (ISORDIL) 30 mg tablet TAKE 1 TABLET BY MOUTH THREE TIMES DAILY 270 Tab 3   ??? lisinopril (PRINIVIL, ZESTRIL) 40 mg tablet TAKE 1 TABLET BY MOUTH ONCE DAILY 90 Tab 3   ??? metoprolol succinate (TOPROL-XL) 200 mg XL tablet TAKE 1 TABLET BY MOUTH ONCE DAILY FOR HIGH BLOOD PRESSURE 90 Tab 2   ??? cloNIDine HCl (CATAPRES) 0.1 mg tablet TAKE 1 TABLET BY MOUTH ONCE DAILY 90 Tab 0   ??? umeclidinium-vilanterol (ANORO ELLIPTA) 62.5-25 mcg/actuation inhaler Take 1 Puff by inhalation daily. Indications: BRONCHOSPASM PREVENTION WITH COPD 1 Inhaler 5   ??? OXYGEN-AIR DELIVERY SYSTEMS Take 4 L by inhalation continuous.     ??? naproxen sodium (ALEVE) 220 mg tablet Take 440 mg by mouth every twelve (12) hours as needed for Pain.      ??? cholecalciferol, VITAMIN D3, (VITAMIN D3) 5,000 unit tab tablet Take 1 Tab by mouth daily. 30 Tab 5   ??? pravastatin (PRAVACHOL) 20 mg tablet TAKE 1 TABLET BY MOUTH ONCE DAILY AT NIGHT 90 Tab 5   ??? amLODIPine (NORVASC) 10 mg tablet TAKE 1 TABLET BY MOUTH DAILY 90 Tab 3   ??? albuterol-ipratropium (DUO-NEB) 2.5 mg-0.5 mg/3 ml nebu Take 3 mL by inhalation three (3) times daily.     ??? albuterol (PROVENTIL HFA, VENTOLIN HFA, PROAIR HFA) 90 mcg/actuation inhaler Take 1 Puff by inhalation every six (6) hours as needed for Wheezing. 1 Inhaler 0   ??? aspirin 81 mg chewable tablet Take 81 mg by mouth daily.     ??? acetaminophen-codeine (TYLENOL #3) 300-30 mg per tablet Take 1 Tab by mouth every four (4) hours as needed for Pain. Max Daily Amount: 6 Tabs. Indications: Pain 15 Tab 0     No Known Allergies    Objective:  Visit Vitals   ??? BP 144/57   ??? Pulse 78   ??? Temp 96.9 ??F (36.1 ??C) (Oral)   ??? Resp 20   ???  Ht 5\' 6"  (1.676 m)   ??? Wt 292 lb (132.5 kg)   ??? SpO2 98%  Comment: 4 liters   ??? BMI 47.13 kg/m2     Physical Exam:   General appearance - alert, oriented X 3, obese, in no apparent distress  EYE-PERRL, EOMI  Neck - supple, no significant adenopathy   Chest - decreased air entry bilaterally, no wheezes, rales or rhonchi  Heart - normal rate, regular rhythm, normal blood pressure  Abdomen - mild tenderness on left, nondistended, normal BS, no organomegaly  Ext-peripheral pulses normal, bilateral lymphedema, nonpitting  Neuro -alert, oriented, normal speech, no focal findings   Back-full range of motion, no tenderness, palpable spasm or pain on motion     Results for orders placed or performed in visit on 06/11/16   AMB POC URINALYSIS DIP STICK AUTO W/O MICRO   Result Value Ref Range    Color (UA POC) Yellow     Clarity (UA POC) Clear     Glucose (UA POC) Negative Negative    Bilirubin (UA POC) Negative Negative    Ketones (UA POC) Negative Negative    Specific gravity (UA POC) 1.015 1.001 - 1.035     Blood (UA POC) Negative Negative    pH (UA POC) 7.0 4.6 - 8.0    Protein (UA POC) Negative Negative    Urobilinogen (UA POC) 0.2 mg/dL 0.2 - 1    Nitrites (UA POC) Negative Negative    Leukocyte esterase (UA POC) Negative Negative     Assessment/Plan:    ICD-10-CM ICD-9-CM    1. Flank pain R10.9 789.09 AMB POC URINALYSIS DIP STICK AUTO W/O MICRO      [place] RETROPERITONEUM COMP     Patient Instructions          Abdominal Pain: Care Instructions  Your Care Instructions    Abdominal pain has many possible causes. Some aren't serious and get better on their own in a few days. Others need more testing and treatment. If your pain continues or gets worse, you need to be rechecked and may need more tests to find out what is wrong. You may need surgery to correct the problem.  Don't ignore new symptoms, such as fever, nausea and vomiting, urination problems, pain that gets worse, and dizziness. These may be signs of a more serious problem.  Your doctor may have recommended a follow-up visit in the next 8 to 12 hours. If you are not getting better, you may need more tests or treatment.  The doctor has checked you carefully, but problems can develop later. If you notice any problems or new symptoms, get medical treatment right away.  Follow-up care is a key part of your treatment and safety. Be sure to make and go to all appointments, and call your doctor if you are having problems. It's also a good idea to know your test results and keep a list of the medicines you take.  How can you care for yourself at home?  ?? Rest until you feel better.  ?? To prevent dehydration, drink plenty of fluids, enough so that your urine is light yellow or clear like water. Choose water and other caffeine-free clear liquids until you feel better. If you have kidney, heart, or liver disease and have to limit fluids, talk with your doctor before you increase the amount of fluids you drink.   ?? If your stomach is upset, eat mild foods, such as rice, dry toast or crackers, bananas, and  applesauce. Try eating several small meals instead of two or three large ones.  ?? Wait until 48 hours after all symptoms have gone away before you have spicy foods, alcohol, and drinks that contain caffeine.  ?? Do not eat foods that are high in fat.  ?? Avoid anti-inflammatory medicines such as aspirin, ibuprofen (Advil, Motrin), and naproxen (Aleve). These can cause stomach upset. Talk to your doctor if you take daily aspirin for another health problem.  When should you call for help?  Call 911 anytime you think you may need emergency care. For example, call if:  ? ?? You passed out (lost consciousness).   ? ?? You pass maroon or very bloody stools.   ? ?? You vomit blood or what looks like coffee grounds.   ? ?? You have new, severe belly pain.   ?Call your doctor now or seek immediate medical care if:  ? ?? Your pain gets worse, especially if it becomes focused in one area of your belly.   ? ?? You have a new or higher fever.   ? ?? Your stools are black and look like tar, or they have streaks of blood.   ? ?? You have unexpected vaginal bleeding.   ? ?? You have symptoms of a urinary tract infection. These may include:  ?? Pain when you urinate.  ?? Urinating more often than usual.  ?? Blood in your urine.   ? ?? You are dizzy or lightheaded, or you feel like you may faint.   ?Watch closely for changes in your health, and be sure to contact your doctor if:  ? ?? You are not getting better after 1 day (24 hours).   Where can you learn more?  Go to [employer].  Enter [phone] in the search box to learn more about "Abdominal Pain: Care Instructions."  Current as of: August 20, 2015  Content Version: 11.4  ?? 2006-2017 [employer]. Care instructions adapted under license by [employer] (which disclaims liability or warranty  for this information). If you have questions about a medical condition or this instruction, always ask your healthcare professional. [employer] disclaims any warranty or liability for your use of this information.      Follow-up Disposition:  Return 2-3 weeks, for routine follow up.

## 2016-06-11 NOTE — Progress Notes (Signed)
Chief Complaint   Patient presents with   ??? Physical   Patient is here for a routine visit and C/O left side pain, patient had no falls

## 2016-06-11 NOTE — Patient Instructions (Addendum)
Abdominal Pain: Care Instructions  Your Care Instructions    Abdominal pain has many possible causes. Some aren't serious and get better on their own in a few days. Others need more testing and treatment. If your pain continues or gets worse, you need to be rechecked and may need more tests to find out what is wrong. You may need surgery to correct the problem.  Don't ignore new symptoms, such as fever, nausea and vomiting, urination problems, pain that gets worse, and dizziness. These may be signs of a more serious problem.  Your doctor may have recommended a follow-up visit in the next 8 to 12 hours. If you are not getting better, you may need more tests or treatment.  The doctor has checked you carefully, but problems can develop later. If you notice any problems or new symptoms, get medical treatment right away.  Follow-up care is a key part of your treatment and safety. Be sure to make and go to all appointments, and call your doctor if you are having problems. It's also a good idea to know your test results and keep a list of the medicines you take.  How can you care for yourself at home?  ?? Rest until you feel better.  ?? To prevent dehydration, drink plenty of fluids, enough so that your urine is light yellow or clear like water. Choose water and other caffeine-free clear liquids until you feel better. If you have kidney, heart, or liver disease and have to limit fluids, talk with your doctor before you increase the amount of fluids you drink.  ?? If your stomach is upset, eat mild foods, such as rice, dry toast or crackers, bananas, and applesauce. Try eating several small meals instead of two or three large ones.  ?? Wait until 48 hours after all symptoms have gone away before you have spicy foods, alcohol, and drinks that contain caffeine.  ?? Do not eat foods that are high in fat.  ?? Avoid anti-inflammatory medicines such as aspirin, ibuprofen (Advil,  Motrin), and naproxen (Aleve). These can cause stomach upset. Talk to your doctor if you take daily aspirin for another health problem.  When should you call for help?  Call 911 anytime you think you may need emergency care. For example, call if:  ? ?? You passed out (lost consciousness).   ? ?? You pass maroon or very bloody stools.   ? ?? You vomit blood or what looks like coffee grounds.   ? ?? You have new, severe belly pain.   ?Call your doctor now or seek immediate medical care if:  ? ?? Your pain gets worse, especially if it becomes focused in one area of your belly.   ? ?? You have a new or higher fever.   ? ?? Your stools are black and look like tar, or they have streaks of blood.   ? ?? You have unexpected vaginal bleeding.   ? ?? You have symptoms of a urinary tract infection. These may include:  ?? Pain when you urinate.  ?? Urinating more often than usual.  ?? Blood in your urine.   ? ?? You are dizzy or lightheaded, or you feel like you may faint.   ?Watch closely for changes in your health, and be sure to contact your doctor if:  ? ?? You are not getting better after 1 day (24 hours).   Where can you learn more?  Go to http://www.healthwise.net/[employer].  Enter E907 in the search   box to learn more about "Abdominal Pain: Care Instructions."  Current as of: August 20, 2015  Content Version: 11.4  ?? 2006-2017 [employer]. Care instructions adapted under license by [employer] (which disclaims liability or warranty for this information). If you have questions about a medical condition or this instruction, always ask your healthcare professional. [employer] disclaims any warranty or liability for your use of this information.

## 2016-06-11 NOTE — Progress Notes (Signed)
This Clinical research associatewriter received message requesting call back from patient. Spoke with patient after verifying name and DOB.  Patient reports she is not feeling any better and would like appointment ASAP. Patient scheduled to see Dr. Raiford Simmondsomah today, 06/11/16 @ 1215. .Marland Kitchen

## 2016-06-12 NOTE — Progress Notes (Signed)
Spoke with patient after verifying name and DOB. Patient reports she is feeling slightly better. Pain medication is helping.Currently using every 4 hours as prescribed. Advised of side effects of Tylenol #3.  She is resting for 2 days to give medication time to work. Weight remains 292. Will outreach this Clinical research associatewriter next week with update.

## 2016-06-13 ENCOUNTER — Encounter

## 2016-06-13 ENCOUNTER — Encounter: Attending: Internal Medicine | Primary: Internal Medicine

## 2016-06-17 NOTE — Progress Notes (Signed)
Unable to reach patient. This Clinical research associatewriter contact information left on voicemail.       Spoke with patient after verifying name and DOB. Patient reports flank pain has resolved. Still have some joint pain so she is limiting ovement. Continues to rest when needed. Restricting Na and fluids.

## 2016-06-18 ENCOUNTER — Encounter

## 2016-06-20 NOTE — Progress Notes (Signed)
Spoke with patient after verifying name and DOB. Patient reports she has been able to do some laps in her home, moving slowly. Following low Na, DM diet. Her current weight is 291. Patient shared she maybe moving later this year.

## 2016-06-24 NOTE — Progress Notes (Signed)
Spoke with patient after verifying name and DOB.  Goals     ??? Improve activity tolerance (pt-stated)            02/19/16 Patient wants to be able walk into PCP office without stopping for break at  appointment in October. Advised patient to slowly increase number of steps, wear oxygen a prescribed and to be sure to have extra tank in case needed. SAJ    02/26/16 Patient wants to weight to be in the 280's and be able to walk to her mailbox by Christmas. This Probation officer asked educated patient on increase walking slowly to build tolerance. She will measure how far she walks inside her home by laps and be able to tell this writer how many she is walking per day since she is unable to at this time. Today's weight is 296.5 pounds. SAJ    03/13/16 Patient reports walking 3 laps per day inside her home. She met goal of walking into providers office for appointment today without stopping. Did not meet goal of weight however her clothes are fitting better. Today's home weight is 297. Advised patient to continue 3 laps until next outreach and will discuss increasing laps. SAJ     03/20/16 Patient has continued 3 laps inside her home. Today's weight is 295. She was able to wash, dry, and fold clothes today.  Patient denies increase in pillow use, SOB. Patient new goal is to walk up the stairs to providers office in June 2018. She will continue 3 laps inside home and add walking the driveway and report to this Probation officer at next outreach. SAJ    03/27/16 Patient reports continued 3 laps in her home. Feeling well weight has remained at 295. 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 Patient will continue 3 laps in home and either add driveway or additional lap in home and report to this Probation officer at next outreach. SAJ    04/03/16 Patient continues to walk 3 laps inside home. Weight has been stable at 295. Patient reports being able to have conversations with no  SOB. No increase in pillows or sleeping upright. Patient will continue 3 laps in home and either add driveway or additional lap in home and report to this Probation officer at next outreach. SAJ    04/09/16 Patient continues to walk 3 lapsinside her home due to arthritis in foot. Reports weight as 293. No increase in pillow use or sleeping upright. Continues to limit fluids and sodium and using portion control. Patient will continue 3 laps in home and either add driveway or additional lap in home and report to this Probation officer at next outreach. SAJ    04/15/16 Patient reports weight ar 293 and she is walking 4.5 laps in her home. Continues to limit fluids, sodium and using portion control. Denies swelling, increase in pillows or sleeping upright. She has plans with family this weekend.  Patient will continue walking 4.5 laps or more and report to this writer at next out reach. SAJ    04/22/16 Patient reports weight at 295 due to increase in food intake at family outing. Denies SOB, swelling, chest pain,increase in pillow use able to speak in complete sentences. She continues to walk to walk 4.5 laps in home and also shopped this pass weekend. Patient will call cardiology if increase of 3 pounds in a day or 5 pounds in a week. SAJ     04/30/16 Patient reports weight of 297. She  did not follow fluid restrictions for a few days. She is now back to fluid restriction. Following diet. Denies SOB or feeling tired. Speaking in complete sentences. She is currently walking 5 laps inside home and a few outside of home. Patient will restrict fluid intake and maintain current activity. Report to Probation officer at next outreach. SAJ    05/07/16 Patient report felling well weight stable no gain. Adherence to diet and fluid restriction. Able to shop in [place] with no issues. Denies sleeping upright. Patient abe to speak in complete sentences. Patient will continue laps in home and report at next outreach.  SAJ      05/14/16 Patient reports weight as 298. No gain. Adherence to fluid restriction. Patient reports walking 7 laps in her home increased by 2 laps speaking on complete senrences. Patient will maintain 7 laps and report to writer at next outreach in 2 weeks. SAJ    06/24/16 Patient reports arthritis is better and she has begun laps in her home again. Plan for today is to walk outside weather permitting. Will report to this Probation officer at next outreach. SAJ

## 2016-07-01 NOTE — Progress Notes (Signed)
Unable to reach patient. This Clinical research associatewriter contact information left on voicemail.     [phone] Spoke with patient after verifying name and DOB.  Patient reports she is back to walking laps in home. Continues with low Na DM diet, and fluid restriction. No concerns or issues at this time.

## 2016-07-08 NOTE — Progress Notes (Signed)
Spoke with patient after verifying name and DOB.    Goals     ??? Improve activity tolerance (pt-stated)            02/19/16 Patient wants to be able walk into PCP office without stopping for break at  appointment in October. Advised patient to slowly increase number of steps, wear oxygen a prescribed and to be sure to have extra tank in case needed. SAJ    02/26/16 Patient wants to weight to be in the 280's and be able to walk to her mailbox by Christmas. This Probation officer asked educated patient on increase walking slowly to build tolerance. She will measure how far she walks inside her home by laps and be able to tell this writer how many she is walking per day since she is unable to at this time. Today's weight is 296.5 pounds. SAJ    03/13/16 Patient reports walking 3 laps per day inside her home. She met goal of walking into providers office for appointment today without stopping. Did not meet goal of weight however her clothes are fitting better. Today's home weight is 297. Advised patient to continue 3 laps until next outreach and will discuss increasing laps. SAJ     03/20/16 Patient has continued 3 laps inside her home. Today's weight is 295. She was able to wash, dry, and fold clothes today.  Patient denies increase in pillow use, SOB. Patient new goal is to walk up the stairs to providers office in June 2018. She will continue 3 laps inside home and add walking the driveway and report to this Probation officer at next outreach. SAJ    03/27/16 Patient reports continued 3 laps in her home. Feeling well weight has remained at 295. 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 Patient will continue 3 laps in home and either add driveway or additional lap in home and report to this Probation officer at next outreach. SAJ    04/03/16 Patient continues to walk 3 laps inside home. Weight has been stable at 295. Patient reports being able to have conversations with no  SOB. No increase in pillows or sleeping upright. Patient will continue 3 laps in home and either add driveway or additional lap in home and report to this Probation officer at next outreach. SAJ    04/09/16 Patient continues to walk 3 lapsinside her home due to arthritis in foot. Reports weight as 293. No increase in pillow use or sleeping upright. Continues to limit fluids and sodium and using portion control. Patient will continue 3 laps in home and either add driveway or additional lap in home and report to this Probation officer at next outreach. SAJ    04/15/16 Patient reports weight ar 293 and she is walking 4.5 laps in her home. Continues to limit fluids, sodium and using portion control. Denies swelling, increase in pillows or sleeping upright. She has plans with family this weekend.  Patient will continue walking 4.5 laps or more and report to this writer at next out reach. SAJ    04/22/16 Patient reports weight at 295 due to increase in food intake at family outing. Denies SOB, swelling, chest pain,increase in pillow use able to speak in complete sentences. She continues to walk to walk 4.5 laps in home and also shopped this pass weekend. Patient will call cardiology if increase of 3 pounds in a day or 5 pounds in a week. SAJ     04/30/16 Patient reports weight of  297. She did not follow fluid restrictions for a few days. She is now back to fluid restriction. Following diet. Denies SOB or feeling tired. Speaking in complete sentences. She is currently walking 5 laps inside home and a few outside of home. Patient will restrict fluid intake and maintain current activity. Report to Probation officer at next outreach. SAJ    05/07/16 Patient report felling well weight stable no gain. Adherence to diet and fluid restriction. Able to shop in [place] with no issues. Denies sleeping upright. Patient abe to speak in complete sentences. Patient will continue laps in home and report at next outreach.  SAJ      05/14/16 Patient reports weight as 298. No gain. Adherence to fluid restriction. Patient reports walking 7 laps in her home increased by 2 laps speaking on complete senrences. Patient will maintain 7 laps and report to writer at next outreach in 2 weeks. SAJ    06/24/16 Patient reports arthritis is better and she has begun laps in her home again. Plan for today is to walk outside weather permitting. Will report to this Probation officer at next outreach. SAJ    07/09/15 Patient repots feeling well. Weight today is 290. She is walking laps in her home. Currently at 6 laps. She is compliant with DM, low Na diet, and fluid restriction. Will increase laps in home by 2 and report to Probation officer at next outreach. SAJ

## 2016-07-15 NOTE — Progress Notes (Signed)
Unable to reach patient. This Probation officer contact information left on voicemail.     [place]     ??? Improve activity tolerance (pt-stated)                  02/19/16 Patient wants to be able walk into PCP office without stopping for break at  appointment in October. Advised patient to slowly increase number of steps, wear oxygen a prescribed and to be sure to have extra tank in case needed. SAJ    02/26/16 Patient wants to weight to be in the 280's and be able to walk to her mailbox by Christmas. This Probation officer asked educated patient on increase walking slowly to build tolerance. She will measure how far she walks inside her home by laps and be able to tell this writer how many she is walking per day since she is unable to at this time. Today's weight is 296.5 pounds. SAJ    03/13/16 Patient reports walking 3 laps per day inside her home. She met goal of walking into providers office for appointment today without stopping. Did not meet goal of weight however her clothes are fitting better. Today's home weight is 297. Advised patient to continue 3 laps until next outreach and will discuss increasing laps. SAJ     03/20/16 Patient has continued 3 laps inside her home. Today's weight is 295. She was able to wash, dry, and fold clothes today.  Patient denies increase in pillow use, SOB. Patient new goal is to walk up the stairs to providers office in June 2018. She will continue 3 laps inside home and add walking the driveway and report to this Probation officer at next outreach. SAJ    03/27/16 Patient reports continued 3 laps in her home. Feeling well weight has remained at 295. 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 Patient will continue 3 laps in home and either add driveway or additional lap in home and report to this Probation officer at next outreach. SAJ    04/03/16 Patient continues to walk 3 laps inside home. Weight has been stable at 295. Patient reports being able to have conversations with no  SOB. No increase in pillows or sleeping upright. Patient will continue 3 laps in home and either add driveway or additional lap in home and report to this Probation officer at next outreach. SAJ    04/09/16 Patient continues to walk 3 lapsinside her home due to arthritis in foot. Reports weight as 293. No increase in pillow use or sleeping upright. Continues to limit fluids and sodium and using portion control. Patient will continue 3 laps in home and either add driveway or additional lap in home and report to this Probation officer at next outreach. SAJ    04/15/16 Patient reports weight ar 293 and she is walking 4.5 laps in her home. Continues to limit fluids, sodium and using portion control. Denies swelling, increase in pillows or sleeping upright. She has plans with family this weekend.  Patient will continue walking 4.5 laps or more and report to this writer at next out reach. SAJ    04/22/16 Patient reports weight at 295 due to increase in food intake at family outing. Denies SOB, swelling, chest pain,increase in pillow use able to speak in complete sentences. She continues to walk to walk 4.5 laps in home and also shopped this pass weekend. Patient will call cardiology if increase of 3 pounds in a day or 5  pounds in a week. SAJ     04/30/16 Patient reports weight of 297. She did not follow fluid restrictions for a few days. She is now back to fluid restriction. Following diet. Denies SOB or feeling tired. Speaking in complete sentences. She is currently walking 5 laps inside home and a few outside of home. Patient will restrict fluid intake and maintain current activity. Report to Probation officer at next outreach. SAJ    05/07/16 Patient report felling well weight stable no gain. Adherence to diet and fluid restriction. Able to shop in [place] with no issues. Denies sleeping upright. Patient abe to speak in complete sentences. Patient will continue laps in home and report at next outreach.  SAJ      05/14/16 Patient reports weight as 298. No gain. Adherence to fluid restriction. Patient reports walking 7 laps in her home increased by 2 laps speaking on complete senrences. Patient will maintain 7 laps and report to writer at next outreach in 2 weeks. SAJ    06/24/16 Patient reports arthritis is better and she has begun laps in her home again. Plan for today is to walk outside weather permitting. Will report to this Probation officer at next outreach. SAJ    07/09/15 Patient repots feeling well. Weight today is 290. She is walking laps in her home. Currently at 6 laps. She is compliant with DM, low Na diet, and fluid restriction. Will increase laps in home by 2 and report to Probation officer at next outreach. SAJ    07/15/16 Patient reports she is walking 6-8 laps in home. Her currently weight remains 290. Patient will maintain 8 laps and report to Probation officer at next outreach. SAJ

## 2016-07-16 ENCOUNTER — Inpatient Hospital Stay: Admit: 2016-07-16 | Payer: MEDICARE | Attending: Internal Medicine | Primary: Internal Medicine

## 2016-07-16 DIAGNOSIS — R1031 Right lower quadrant pain: Secondary | ICD-10-CM

## 2016-07-22 NOTE — Progress Notes (Signed)
Spoke with patient after verifying name and DOB. Patient is planning vacation for May and will be working with this Probation officer to come up with vacation plan.    Goals Addressed     ??? Improve activity tolerance (pt-stated)                  02/19/16 Patient wants to be able walk into PCP office without stopping for break at  appointment in October. Advised patient to slowly increase number of steps, wear oxygen a prescribed and to be sure to have extra tank in case needed. SAJ    02/26/16 Patient wants to weight to be in the 280's and be able to walk to her mailbox by Christmas. This Probation officer asked educated patient on increase walking slowly to build tolerance. She will measure how far she walks inside her home by laps and be able to tell this writer how many she is walking per day since she is unable to at this time. Today's weight is 296.5 pounds. SAJ    03/13/16 Patient reports walking 3 laps per day inside her home. She met goal of walking into providers office for appointment today without stopping. Did not meet goal of weight however her clothes are fitting better. Today's home weight is 297. Advised patient to continue 3 laps until next outreach and will discuss increasing laps. SAJ     03/20/16 Patient has continued 3 laps inside her home. Today's weight is 295. She was able to wash, dry, and fold clothes today.  Patient denies increase in pillow use, SOB. Patient new goal is to walk up the stairs to providers office in June 2018. She will continue 3 laps inside home and add walking the driveway and report to this Probation officer at next outreach. SAJ    03/27/16 Patient reports continued 3 laps in her home. Feeling well weight has remained at 295. 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 Patient will continue 3 laps in home and either add driveway or additional lap in home and report to this Probation officer at next outreach. SAJ    04/03/16 Patient continues to walk 3 laps inside home. Weight has been  stable at 295. Patient reports being able to have conversations with no SOB. No increase in pillows or sleeping upright. Patient will continue 3 laps in home and either add driveway or additional lap in home and report to this Probation officer at next outreach. SAJ    04/09/16 Patient continues to walk 3 lapsinside her home due to arthritis in foot. Reports weight as 293. No increase in pillow use or sleeping upright. Continues to limit fluids and sodium and using portion control. Patient will continue 3 laps in home and either add driveway or additional lap in home and report to this Probation officer at next outreach. SAJ    04/15/16 Patient reports weight ar 293 and she is walking 4.5 laps in her home. Continues to limit fluids, sodium and using portion control. Denies swelling, increase in pillows or sleeping upright. She has plans with family this weekend.  Patient will continue walking 4.5 laps or more and report to this writer at next out reach. SAJ    04/22/16 Patient reports weight at 295 due to increase in food intake at family outing. Denies SOB, swelling, chest pain,increase in pillow use able to speak in complete sentences. She continues to walk to walk 4.5 laps in home and also shopped this pass weekend. Patient will  call cardiology if increase of 3 pounds in a day or 5 pounds in a week. SAJ     04/30/16 Patient reports weight of 297. She did not follow fluid restrictions for a few days. She is now back to fluid restriction. Following diet. Denies SOB or feeling tired. Speaking in complete sentences. She is currently walking 5 laps inside home and a few outside of home. Patient will restrict fluid intake and maintain current activity. Report to Probation officer at next outreach. SAJ    05/07/16 Patient report felling well weight stable no gain. Adherence to diet and fluid restriction. Able to shop in [place] with no issues. Denies sleeping upright. Patient abe to speak in complete sentences.  Patient will continue laps in home and report at next outreach.  SAJ     05/14/16 Patient reports weight as 298. No gain. Adherence to fluid restriction. Patient reports walking 7 laps in her home increased by 2 laps speaking on complete senrences. Patient will maintain 7 laps and report to writer at next outreach in 2 weeks. SAJ    06/24/16 Patient reports arthritis is better and she has begun laps in her home again. Plan for today is to walk outside weather permitting. Will report to this Probation officer at next outreach. SAJ    07/09/15 Patient repots feeling well. Weight today is 290. She is walking laps in her home. Currently at 6 laps. She is compliant with DM, low Na diet, and fluid restriction. Will increase laps in home by 2 and report to Probation officer at next outreach. SAJ    07/15/16 Patient reports she is walking 6-8 laps in home. Her currently weight remains 290. Patient will maintain 8 laps and report to Probation officer at next outreach. SAJ    07/22/16 Patient reports walking 8 or more laps per day. She has now also walking outside . Complaint with fluids and diet. SAJ

## 2016-07-30 NOTE — Progress Notes (Signed)
Spoke with patient after verifying name and DOB.    Goals     ??? Improve activity tolerance (pt-stated)            02/19/16 Patient wants to be able walk into PCP office without stopping for break at  appointment in October. Advised patient to slowly increase number of steps, wear oxygen a prescribed and to be sure to have extra tank in case needed. SAJ    02/26/16 Patient wants to weight to be in the 280's and be able to walk to her mailbox by Christmas. This Probation officer asked educated patient on increase walking slowly to build tolerance. She will measure how far she walks inside her home by laps and be able to tell this writer how many she is walking per day since she is unable to at this time. Today's weight is 296.5 pounds. SAJ    03/13/16 Patient reports walking 3 laps per day inside her home. She met goal of walking into providers office for appointment today without stopping. Did not meet goal of weight however her clothes are fitting better. Today's home weight is 297. Advised patient to continue 3 laps until next outreach and will discuss increasing laps. SAJ     03/20/16 Patient has continued 3 laps inside her home. Today's weight is 295. She was able to wash, dry, and fold clothes today.  Patient denies increase in pillow use, SOB. Patient new goal is to walk up the stairs to providers office in June 2018. She will continue 3 laps inside home and add walking the driveway and report to this Probation officer at next outreach. SAJ    03/27/16 Patient reports continued 3 laps in her home. Feeling well weight has remained at 295. 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 Patient will continue 3 laps in home and either add driveway or additional lap in home and report to this Probation officer at next outreach. SAJ    04/03/16 Patient continues to walk 3 laps inside home. Weight has been stable at 295. Patient reports being able to have conversations with no  SOB. No increase in pillows or sleeping upright. Patient will continue 3 laps in home and either add driveway or additional lap in home and report to this Probation officer at next outreach. SAJ    04/09/16 Patient continues to walk 3 lapsinside her home due to arthritis in foot. Reports weight as 293. No increase in pillow use or sleeping upright. Continues to limit fluids and sodium and using portion control. Patient will continue 3 laps in home and either add driveway or additional lap in home and report to this Probation officer at next outreach. SAJ    04/15/16 Patient reports weight ar 293 and she is walking 4.5 laps in her home. Continues to limit fluids, sodium and using portion control. Denies swelling, increase in pillows or sleeping upright. She has plans with family this weekend.  Patient will continue walking 4.5 laps or more and report to this writer at next out reach. SAJ    04/22/16 Patient reports weight at 295 due to increase in food intake at family outing. Denies SOB, swelling, chest pain,increase in pillow use able to speak in complete sentences. She continues to walk to walk 4.5 laps in home and also shopped this pass weekend. Patient will call cardiology if increase of 3 pounds in a day or 5 pounds in a week. SAJ     04/30/16 Patient reports weight of  297. She did not follow fluid restrictions for a few days. She is now back to fluid restriction. Following diet. Denies SOB or feeling tired. Speaking in complete sentences. She is currently walking 5 laps inside home and a few outside of home. Patient will restrict fluid intake and maintain current activity. Report to Probation officer at next outreach. SAJ    05/07/16 Patient report felling well weight stable no gain. Adherence to diet and fluid restriction. Able to shop in [place] with no issues. Denies sleeping upright. Patient abe to speak in complete sentences. Patient will continue laps in home and report at next outreach.  SAJ      05/14/16 Patient reports weight as 298. No gain. Adherence to fluid restriction. Patient reports walking 7 laps in her home increased by 2 laps speaking on complete senrences. Patient will maintain 7 laps and report to writer at next outreach in 2 weeks. SAJ    06/24/16 Patient reports arthritis is better and she has begun laps in her home again. Plan for today is to walk outside weather permitting. Will report to this Probation officer at next outreach. SAJ    07/09/15 Patient repots feeling well. Weight today is 290. She is walking laps in her home. Currently at 6 laps. She is compliant with DM, low Na diet, and fluid restriction. Will increase laps in home by 2 and report to Probation officer at next outreach. SAJ    07/15/16 Patient reports she is walking 6-8 laps in home. Her currently weight remains 290. Patient will maintain 8 laps and report to Probation officer at next outreach. SAJ    07/22/16 Patient reports walking 8 or more laps per day. She has now also walking outside . Complaint with fluids and diet. SAJ    07/30/16 Patient reports compliance with diet, weights, fluid restriction, exercise, and appointments. Going to visit family out of town for weekend. Will report to this writer at appointment on 08/06/16. SAJ

## 2016-08-06 ENCOUNTER — Ambulatory Visit: Admit: 2016-08-06 | Discharge: 2016-08-06 | Payer: MEDICARE | Attending: Internal Medicine | Primary: Internal Medicine

## 2016-08-06 DIAGNOSIS — E119 Type 2 diabetes mellitus without complications: Secondary | ICD-10-CM

## 2016-08-06 MED ORDER — AMLODIPINE 10 MG TAB
10 mg | ORAL_TABLET | ORAL | 3 refills | Status: DC
Start: 2016-08-06 — End: 2017-03-12

## 2016-08-06 NOTE — Patient Instructions (Addendum)
Type 2 Diabetes: Care Instructions  Your Care Instructions    Type 2 diabetes is a disease that develops when the body's tissues cannot use insulin properly. Over time, the pancreas cannot make enough insulin. Insulin is a hormone that helps the body's cells use sugar (glucose) for energy. It also helps the body store extra sugar in muscle, fat, and liver cells.  Without insulin, the sugar cannot get into the cells to do its work. It stays in the blood instead. This can cause high blood sugar levels. A person has diabetes when the blood sugar stays too high too much of the time. Over time, diabetes can lead to diseases of the heart, blood vessels, nerves, kidneys, and eyes.  You may be able to control your blood sugar by losing weight, eating a healthy diet, and getting daily exercise. You may also have to take insulin or other diabetes medicine.  Follow-up care is a key part of your treatment and safety. Be sure to make and go to all appointments. Call your doctor if you are having problems. It's also a good idea to know your test results and keep a list of the medicines you take.  How can you care for yourself at home?  ?? Keep your blood sugar at a target level (which you set with your doctor).  ?? Eat a good diet that spreads carbohydrate throughout the day. Carbohydrate-the body's main source of fuel-affects blood sugar more than any other nutrient. Carbohydrate is in fruits, vegetables, milk, and yogurt. It also is in breads, cereals, vegetables such as potatoes and corn, and sugary foods such as candy and cakes.  ?? Aim for 30 minutes of exercise on most, preferably all, days of the week. Walking is a good choice. You also may want to do other activities, such as running, swimming, cycling, or playing tennis or team sports. If your doctor says it's okay, do muscle-strengthening exercises at least 2 times a week.  ?? Take your medicines exactly as prescribed. Call your doctor if you think  you are having a problem with your medicine. You will get more details on the specific medicines your doctor prescribes.  ?? Check your blood sugar as often as your doctor recommends. It is important to keep track of any symptoms you have, such as low blood sugar. Also tell your doctor if you have any changes in your activities, diet, or insulin use.  ?? Talk to your doctor before you start taking aspirin every day. Aspirin can help certain people lower their risk of a heart attack or stroke. But taking aspirin isn't right for everyone, because it can cause serious bleeding.  ?? Do not smoke. If you need help quitting, talk to your doctor about stop-smoking programs and medicines. These can increase your chances of quitting for good.  ?? Keep your cholesterol and blood pressure at normal levels. You may need to take one or more medicines to reach your goals. Take them exactly as directed. Do not stop or change a medicine without talking to your doctor first.  When should you call for help?  Call 911 anytime you think you may need emergency care. For example, call if:  ? ?? You passed out (lost consciousness), or you suddenly become very sleepy or confused. (You may have very low blood sugar.)   ?Call your doctor now or seek immediate medical care if:  ? ?? Your blood sugar is 300 mg/dL or is higher than the level your doctor has   set for you.   ? ?? You have symptoms of low blood sugar, such as:  ?? Sweating.  ?? Feeling nervous, shaky, and weak.  ?? Extreme hunger and slight nausea.  ?? Dizziness and headache.  ?? Blurred vision.  ?? Confusion.   ?Watch closely for changes in your health, and be sure to contact your doctor if:  ? ?? You often have problems controlling your blood sugar.   ? ?? You have symptoms of long-term diabetes problems, such as:  ?? New vision changes.  ?? New pain, numbness, or tingling in your hands or feet.  ?? Skin problems.   Where can you learn more?  Go to http://www.healthwise.net/[facility].   Enter C553 in the search box to learn more about "Type 2 Diabetes: Care Instructions."  Current as of: August 13, 2015  Content Version: 11.4  ?? 2006-2017 [employer]. Care instructions adapted under license by [employer] (which disclaims liability or warranty for this information). If you have questions about a medical condition or this instruction, always ask your healthcare professional. [employer] disclaims any warranty or liability for your use of this information.

## 2016-08-06 NOTE — Progress Notes (Signed)
Goals     ??? Improve activity tolerance (pt-stated)            02/19/16 Patient wants to be able walk into PCP office without stopping for break at  appointment in October. Advised patient to slowly increase number of steps, wear oxygen a prescribed and to be sure to have extra tank in case needed. SAJ    02/26/16 Patient wants to weight to be in the 280's and be able to walk to her mailbox by Christmas. This Probation officer asked educated patient on increase walking slowly to build tolerance. She will measure how far she walks inside her home by laps and be able to tell this writer how many she is walking per day since she is unable to at this time. Today's weight is 296.5 pounds. SAJ    03/13/16 Patient reports walking 3 laps per day inside her home. She met goal of walking into providers office for appointment today without stopping. Did not meet goal of weight however her clothes are fitting better. Today's home weight is 297. Advised patient to continue 3 laps until next outreach and will discuss increasing laps. SAJ     03/20/16 Patient has continued 3 laps inside her home. Today's weight is 295. She was able to wash, dry, and fold clothes today.  Patient denies increase in pillow use, SOB. Patient new goal is to walk up the stairs to providers office in June 2018. She will continue 3 laps inside home and add walking the driveway and report to this Probation officer at next outreach. SAJ    03/27/16 Patient reports continued 3 laps in her home. Feeling well weight has remained at 295. 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 Patient will continue 3 laps in home and either add driveway or additional lap in home and report to this Probation officer at next outreach. SAJ    04/03/16 Patient continues to walk 3 laps inside home. Weight has been stable at 295. Patient reports being able to have conversations with no SOB. No increase in pillows or sleeping upright. Patient will continue 3  laps in home and either add driveway or additional lap in home and report to this Probation officer at next outreach. SAJ    04/09/16 Patient continues to walk 3 lapsinside her home due to arthritis in foot. Reports weight as 293. No increase in pillow use or sleeping upright. Continues to limit fluids and sodium and using portion control. Patient will continue 3 laps in home and either add driveway or additional lap in home and report to this Probation officer at next outreach. SAJ    04/15/16 Patient reports weight ar 293 and she is walking 4.5 laps in her home. Continues to limit fluids, sodium and using portion control. Denies swelling, increase in pillows or sleeping upright. She has plans with family this weekend.  Patient will continue walking 4.5 laps or more and report to this writer at next out reach. SAJ    04/22/16 Patient reports weight at 295 due to increase in food intake at family outing. Denies SOB, swelling, chest pain,increase in pillow use able to speak in complete sentences. She continues to walk to walk 4.5 laps in home and also shopped this pass weekend. Patient will call cardiology if increase of 3 pounds in a day or 5 pounds in a week. SAJ     04/30/16 Patient reports weight of 297. She did not follow fluid restrictions for a few days.  She is now back to fluid restriction. Following diet. Denies SOB or feeling tired. Speaking in complete sentences. She is currently walking 5 laps inside home and a few outside of home. Patient will restrict fluid intake and maintain current activity. Report to Probation officer at next outreach. SAJ    05/07/16 Patient report felling well weight stable no gain. Adherence to diet and fluid restriction. Able to shop in [place] with no issues. Denies sleeping upright. Patient abe to speak in complete sentences. Patient will continue laps in home and report at next outreach.  SAJ     05/14/16 Patient reports weight as 298. No gain. Adherence to fluid  restriction. Patient reports walking 7 laps in her home increased by 2 laps speaking on complete senrences. Patient will maintain 7 laps and report to writer at next outreach in 2 weeks. SAJ    06/24/16 Patient reports arthritis is better and she has begun laps in her home again. Plan for today is to walk outside weather permitting. Will report to this Probation officer at next outreach. SAJ    07/09/15 Patient repots feeling well. Weight today is 290. She is walking laps in her home. Currently at 6 laps. She is compliant with DM, low Na diet, and fluid restriction. Will increase laps in home by 2 and report to Probation officer at next outreach. SAJ    07/15/16 Patient reports she is walking 6-8 laps in home. Her currently weight remains 290. Patient will maintain 8 laps and report to Probation officer at next outreach. SAJ    07/22/16 Patient reports walking 8 or more laps per day. She has now also walking outside . Complaint with fluids and diet. SAJ    07/30/16 Patient reports compliance with diet, weights, fluid restriction, exercise, and appointments. Going to visit family out of town for weekend. Will report to this writer at appointment on 08/06/16. SAJ    08/06/16 Patient reports weight is 288. Patient reached goal to be in 280's by today's PCP appointment. Complaint with weights, fluid restrictions. She is walking at least 8 laps per day in her home. Will reports lap number to this Probation officer at next outreach. SAJ

## 2016-08-06 NOTE — Progress Notes (Signed)
Chief Complaint   Patient presents with   ??? Hypertension     HPI:  Brittney Shelton is a 66 y.o. AA female with Diabetes, hypertension, copd on home oxygen, hypercholesterolemia presents for routine follow up  Patient has been doing good, blood pressure is at goal on current medication, she has no complaints.    Review of Systems  As per hpi    Past Medical History:   Diagnosis Date   ??? Chronic obstructive pulmonary disease (HCC)    ??? Congestive heart failure (HCC)    ??? Diabetes (HCC)    ??? Hypertension      History reviewed. No pertinent surgical history.  Social History     Social History   ??? Marital status: SINGLE     Spouse name: N/A   ??? Number of children: N/A   ??? Years of education: N/A     Social History Main Topics   ??? Smoking status: Never Smoker   ??? Smokeless tobacco: Never Used   ??? Alcohol use No   ??? Drug use: No   ??? Sexual activity: No     Other Topics Concern   ??? None     Social History Narrative     Family History   Problem Relation Age of Onset   ??? Diabetes Mother    ??? Arthritis-osteo Father    ??? COPD Sister    ??? Diabetes Brother    ??? Diabetes Brother      Current Outpatient Prescriptions   Medication Sig Dispense Refill   ??? acetaminophen-codeine (TYLENOL #3) 300-30 mg per tablet Take 1 Tab by mouth every four (4) hours as needed for Pain. Max Daily Amount: 6 Tabs. Indications: Pain 15 Tab 0   ??? furosemide (LASIX) 40 mg tablet TAKE 1 TABLET BY MOUTH TWICE DAILY FOR SWELLING 180 Tab 0   ??? isosorbide dinitrate (ISORDIL) 30 mg tablet TAKE 1 TABLET BY MOUTH THREE TIMES DAILY 270 Tab 3   ??? lisinopril (PRINIVIL, ZESTRIL) 40 mg tablet TAKE 1 TABLET BY MOUTH ONCE DAILY 90 Tab 3   ??? metoprolol succinate (TOPROL-XL) 200 mg XL tablet TAKE 1 TABLET BY MOUTH ONCE DAILY FOR HIGH BLOOD PRESSURE 90 Tab 2   ??? cloNIDine HCl (CATAPRES) 0.1 mg tablet TAKE 1 TABLET BY MOUTH ONCE DAILY 90 Tab 0   ??? umeclidinium-vilanterol (ANORO ELLIPTA) 62.5-25 mcg/actuation inhaler  Take 1 Puff by inhalation daily. Indications: BRONCHOSPASM PREVENTION WITH COPD 1 Inhaler 5   ??? OXYGEN-AIR DELIVERY SYSTEMS Take 4 L by inhalation continuous.     ??? naproxen sodium (ALEVE) 220 mg tablet Take 440 mg by mouth every twelve (12) hours as needed for Pain.     ??? cholecalciferol, VITAMIN D3, (VITAMIN D3) 5,000 unit tab tablet Take 1 Tab by mouth daily. 30 Tab 5   ??? pravastatin (PRAVACHOL) 20 mg tablet TAKE 1 TABLET BY MOUTH ONCE DAILY AT NIGHT 90 Tab 5   ??? amLODIPine (NORVASC) 10 mg tablet TAKE 1 TABLET BY MOUTH DAILY 90 Tab 3   ??? albuterol-ipratropium (DUO-NEB) 2.5 mg-0.5 mg/3 ml nebu Take 3 mL by inhalation three (3) times daily.     ??? albuterol (PROVENTIL HFA, VENTOLIN HFA, PROAIR HFA) 90 mcg/actuation inhaler Take 1 Puff by inhalation every six (6) hours as needed for Wheezing. 1 Inhaler 0   ??? aspirin 81 mg chewable tablet Take 81 mg by mouth daily.       No Known Allergies    Objective:  Visit Vitals   ???  BP 146/74   ??? Pulse 68   ??? Temp 96 ??F (35.6 ??C) (Oral)   ??? Resp 20   ??? Ht 5\' 6"  (1.676 m)   ??? Wt 288 lb (130.6 kg)   ??? SpO2 91%  Comment: on 4 liter   ??? BMI 46.48 kg/m2     Physical Exam:   General appearance - alert, well appearing, and in no distress  Mental status - alert, oriented to person, place, and time  EYE-PERRL, EOMI  ENT-ENT exam normal, no neck nodes or sinus tenderness  Mouth - mucous membranes moist, pharynx normal without lesions  Neck - supple, no significant adenopathy   Chest - clear to auscultation, no wheezes, rales or rhonchi   Heart - normal rate, regular rhythm, normal blood pressure  Abdomen - soft, nontender, nondistended, no organomegaly  Ext-peripheral pulses normal, bilateral lymphedema  Neuro -alert, oriented, normal speech, no focal findings   Back-full range of motion, no tenderness, palpable spasm or pain on motion     Results for orders placed or performed in visit on 06/11/16   AMB POC URINALYSIS DIP STICK AUTO W/O MICRO   Result Value Ref Range     Color (UA POC) Yellow     Clarity (UA POC) Clear     Glucose (UA POC) Negative Negative    Bilirubin (UA POC) Negative Negative    Ketones (UA POC) Negative Negative    Specific gravity (UA POC) 1.015 1.001 - 1.035    Blood (UA POC) Negative Negative    pH (UA POC) 7.0 4.6 - 8.0    Protein (UA POC) Negative Negative    Urobilinogen (UA POC) 0.2 mg/dL 0.2 - 1    Nitrites (UA POC) Negative Negative    Leukocyte esterase (UA POC) Negative Negative     Assessment/Plan:  Diagnoses and all orders for this visit:    Well controlled type 2 diabetes mellitus (HCC)  -     METABOLIC PANEL, COMPREHENSIVE  -     HEMOGLOBIN A1C WITH EAG    Mixed simple and mucopurulent chronic bronchitis (HCC)  -     METABOLIC PANEL, COMPREHENSIVE    Obesity, morbid (HCC)    Hypercholesterolemia  -     LIPID PANEL    Chronic acquired lymphedema    Requires continuous at home supplemental oxygen    Hypertension goal BP (blood pressure) < 130/80  -     amLODIPine (NORVASC) 10 mg tablet; TAKE 1 TABLET BY MOUTH DAILY, Normal, Disp-90 Tab, R-3  -     METABOLIC PANEL, COMPREHENSIVE    Hypovitaminosis D  -     VITAMIN D, 25 HYDROXY    Frequency of urination  -     URINALYSIS W/ RFLX MICROSCOPIC      Patient Instructions          Type 2 Diabetes: Care Instructions  Your Care Instructions    Type 2 diabetes is a disease that develops when the body's tissues cannot use insulin properly. Over time, the pancreas cannot make enough insulin. Insulin is a hormone that helps the body's cells use sugar (glucose) for energy. It also helps the body store extra sugar in muscle, fat, and liver cells.  Without insulin, the sugar cannot get into the cells to do its work. It stays in the blood instead. This can cause high blood sugar levels. A person has diabetes when the blood sugar stays too high too much of the time. Over time, diabetes can  lead to diseases of the heart, blood vessels, nerves, kidneys, and eyes.   You may be able to control your blood sugar by losing weight, eating a healthy diet, and getting daily exercise. You may also have to take insulin or other diabetes medicine.  Follow-up care is a key part of your treatment and safety. Be sure to make and go to all appointments. Call your doctor if you are having problems. It's also a good idea to know your test results and keep a list of the medicines you take.  How can you care for yourself at home?  ?? Keep your blood sugar at a target level (which you set with your doctor).  ?? Eat a good diet that spreads carbohydrate throughout the day. Carbohydrate-the body's main source of fuel-affects blood sugar more than any other nutrient. Carbohydrate is in fruits, vegetables, milk, and yogurt. It also is in breads, cereals, vegetables such as potatoes and corn, and sugary foods such as candy and cakes.  ?? Aim for 30 minutes of exercise on most, preferably all, days of the week. Walking is a good choice. You also may want to do other activities, such as running, swimming, cycling, or playing tennis or team sports. If your doctor says it's okay, do muscle-strengthening exercises at least 2 times a week.  ?? Take your medicines exactly as prescribed. Call your doctor if you think you are having a problem with your medicine. You will get more details on the specific medicines your doctor prescribes.  ?? Check your blood sugar as often as your doctor recommends. It is important to keep track of any symptoms you have, such as low blood sugar. Also tell your doctor if you have any changes in your activities, diet, or insulin use.  ?? Talk to your doctor before you start taking aspirin every day. Aspirin can help certain people lower their risk of a heart attack or stroke. But taking aspirin isn't right for everyone, because it can cause serious bleeding.  ?? Do not smoke. If you need help quitting, talk to your doctor about  stop-smoking programs and medicines. These can increase your chances of quitting for good.  ?? Keep your cholesterol and blood pressure at normal levels. You may need to take one or more medicines to reach your goals. Take them exactly as directed. Do not stop or change a medicine without talking to your doctor first.  When should you call for help?  Call 911 anytime you think you may need emergency care. For example, call if:  ? ?? You passed out (lost consciousness), or you suddenly become very sleepy or confused. (You may have very low blood sugar.)   ?Call your doctor now or seek immediate medical care if:  ? ?? Your blood sugar is 300 mg/dL or is higher than the level your doctor has set for you.   ? ?? You have symptoms of low blood sugar, such as:  ?? Sweating.  ?? Feeling nervous, shaky, and weak.  ?? Extreme hunger and slight nausea.  ?? Dizziness and headache.  ?? Blurred vision.  ?? Confusion.   ?Watch closely for changes in your health, and be sure to contact your doctor if:  ? ?? You often have problems controlling your blood sugar.   ? ?? You have symptoms of long-term diabetes problems, such as:  ?? New vision changes.  ?? New pain, numbness, or tingling in your hands or feet.  ?? Skin problems.   Where  can you learn more?  Go to [employer].  Enter C553 in the search box to learn more about "Type 2 Diabetes: Care Instructions."  Current as of: August 13, 2015  Content Version: 11.4  ?? 2006-2017 [employer]. Care instructions adapted under license by [employer] (which disclaims liability or warranty for this information). If you have questions about a medical condition or this instruction, always ask your healthcare professional. [employer], Incorporated disclaims any warranty or liability for your use of this information.      Follow-up Disposition:  Return in about 4 months (around 12/06/2016), or if symptoms worsen or fail to improve, for routine follow up.

## 2016-08-11 ENCOUNTER — Encounter

## 2016-08-11 MED ORDER — AMLODIPINE 10 MG TAB
10 mg | ORAL_TABLET | ORAL | 1 refills | Status: DC
Start: 2016-08-11 — End: 2016-09-12

## 2016-08-14 ENCOUNTER — Encounter

## 2016-08-14 MED ORDER — FUROSEMIDE 40 MG TAB
40 mg | ORAL_TABLET | ORAL | 0 refills | Status: DC
Start: 2016-08-14 — End: 2016-11-12

## 2016-08-20 ENCOUNTER — Encounter: Attending: Cardiovascular Disease | Primary: Internal Medicine

## 2016-09-10 ENCOUNTER — Encounter: Attending: Internal Medicine | Primary: Internal Medicine

## 2016-09-12 ENCOUNTER — Ambulatory Visit
Admit: 2016-09-12 | Discharge: 2016-09-12 | Payer: MEDICARE | Attending: Cardiovascular Disease | Primary: Internal Medicine

## 2016-09-12 ENCOUNTER — Inpatient Hospital Stay: Admit: 2016-12-16 | Payer: MEDICARE | Primary: Internal Medicine

## 2016-09-12 DIAGNOSIS — E119 Type 2 diabetes mellitus without complications: Secondary | ICD-10-CM

## 2016-09-12 DIAGNOSIS — I5032 Chronic diastolic (congestive) heart failure: Secondary | ICD-10-CM

## 2016-09-12 NOTE — Progress Notes (Signed)
Chief Complaint   Patient presents with   ??? Hypertension     3 month follow up     1. Have you been to the ER, urgent care clinic since your last visit?  Hospitalized since your last visit?No    2. Have you seen or consulted any other health care providers outside of the [facility] since your last visit?  Include any pap smears or colon screening. No

## 2016-09-12 NOTE — Progress Notes (Signed)
[address] [place] [address]  [id number]     Subjective:      Brittney Shelton is a 66 y.o. female is here for routine f/u.  The patient denies chest pain/ shortness of breath, orthopnea, PND, LE edema, palpitations, syncope, or presyncope.   Wt down 8 lbs.  Eating healthier.  Doing well.    Patient Active Problem List    Diagnosis Date Noted   ??? Obesity, morbid ([place]) 05/21/2016   ??? Acute on chronic respiratory failure with hypoxia and hypercapnia ([place]) 01/23/2016   ??? Acute on chronic diastolic (congestive) heart failure ([place]) 01/23/2016   ??? Obesity 07/27/2015   ??? Requires continuous at home supplemental oxygen 07/27/2015   ??? Dependence on supplemental oxygen 07/27/2015   ??? ACP (advance care planning) 07/27/2015   ??? Counseling regarding advanced care planning and goals of care 07/27/2015   ??? Dyspnea 07/27/2015   ??? COPD (chronic obstructive pulmonary disease) ([place]) 07/26/2015   ??? Well controlled type 2 diabetes mellitus ([place]) 09/29/2014   ??? Hypercholesterolemia 09/29/2014   ??? Mixed simple and mucopurulent chronic bronchitis ([place]) 09/29/2014   ??? Chronic acquired lymphedema 09/29/2014      Ermalene Postin, MD  Past Medical History:   Diagnosis Date   ??? Chronic obstructive pulmonary disease ([place])    ??? Congestive heart failure ([place])    ??? Diabetes ([place])    ??? Hypertension       History reviewed. No pertinent surgical history.  No Known Allergies   Family History   Problem Relation Age of Onset   ??? Diabetes Mother    ??? Arthritis-osteo Father    ??? COPD Sister    ??? Diabetes Brother    ??? Diabetes Brother       Social History     Social History   ??? Marital status: SINGLE     Spouse name: N/A   ??? Number of children: N/A   ??? Years of education: N/A     Occupational History   ??? Not on file.     Social History Main Topics   ??? Smoking status: Never Smoker   ??? Smokeless tobacco: Never Used   ??? Alcohol use No   ??? Drug use: No   ??? Sexual activity: No     Other Topics Concern   ??? Not on file      Social History Narrative      Current Outpatient Prescriptions   Medication Sig   ??? furosemide (LASIX) 40 mg tablet TAKE 1 TABLET BY MOUTH TWICE DAILY FOR SWELLING   ??? amLODIPine (NORVASC) 10 mg tablet TAKE 1 TABLET BY MOUTH DAILY   ??? isosorbide dinitrate (ISORDIL) 30 mg tablet TAKE 1 TABLET BY MOUTH THREE TIMES DAILY   ??? lisinopril (PRINIVIL, ZESTRIL) 40 mg tablet TAKE 1 TABLET BY MOUTH ONCE DAILY   ??? metoprolol succinate (TOPROL-XL) 200 mg XL tablet TAKE 1 TABLET BY MOUTH ONCE DAILY FOR HIGH BLOOD PRESSURE   ??? cloNIDine HCl (CATAPRES) 0.1 mg tablet TAKE 1 TABLET BY MOUTH ONCE DAILY   ??? umeclidinium-vilanterol (ANORO ELLIPTA) 62.5-25 mcg/actuation inhaler Take 1 Puff by inhalation daily. Indications: BRONCHOSPASM PREVENTION WITH COPD   ??? OXYGEN-AIR DELIVERY SYSTEMS Take 4 L by inhalation continuous.   ??? naproxen sodium (ALEVE) 220 mg tablet Take 440 mg by mouth every twelve (12) hours as needed for Pain.   ??? cholecalciferol, VITAMIN D3, (VITAMIN D3) 5,000 unit tab tablet Take 1 Tab by mouth daily.   ???  albuterol-ipratropium (DUO-NEB) 2.5 mg-0.5 mg/3 ml nebu Take 3 mL by inhalation three (3) times daily.   ??? albuterol (PROVENTIL HFA, VENTOLIN HFA, PROAIR HFA) 90 mcg/actuation inhaler Take 1 Puff by inhalation every six (6) hours as needed for Wheezing.   ??? aspirin 81 mg chewable tablet Take 81 mg by mouth daily.   ??? acetaminophen-codeine (TYLENOL #3) 300-30 mg per tablet Take 1 Tab by mouth every four (4) hours as needed for Pain. Max Daily Amount: 6 Tabs. Indications: Pain   ??? pravastatin (PRAVACHOL) 20 mg tablet TAKE 1 TABLET BY MOUTH ONCE DAILY AT NIGHT     No current facility-administered medications for this visit.          Review of Symptoms:  11 systems reviewed, negative other than as stated in the HPI    Physical ExamPhysical Exam: ??  Vitals:    09/12/16 1254 09/12/16 1305   BP: 170/88 140/80   Pulse: 83    Resp: 16    SpO2: 91%    Weight: 289 lb 4.8 oz (131.2 kg)    Height: 5' 6" (1.676 m)       Body mass index is 46.69 kg/(m^2).  General PE   Gen:  NAD  Mental Status - Alert. General Appearance - Not in acute distress.   Chest and Lung Exam   Inspection: Accessory muscles - No use of accessory muscles in breathing.   Auscultation:   Breath sounds: - Normal.   Cardiovascular   Inspection: Jugular vein - Bilateral - Inspection Normal.   Palpation/Percussion:   Apical Impulse: - Normal.   Auscultation: Rhythm - Regular. Heart Sounds - S1 WNL and S2 WNL. No S3 or S4.   Murmurs & Other Heart Sounds: Auscultation of the heart reveals - No Murmurs.   Peripheral Vascular   Upper Extremity: Inspection - Bilateral - No Cyanotic nailbeds or Digital clubbing.   Lower Extremity:   Palpation: Edema - Bilateral - chronic, nonpitting  Abdomen:?? ??Soft, non-tender, bowel sounds are active.  Neuro: A&O times 3, CN and motor grossly WNL    Labs:   Lab Results   Component Value Date/Time    Cholesterol, total 206 (H) 11/07/2015 12:25 PM    Cholesterol, total 179 06/19/2015 10:17 AM    Cholesterol, total 191 02/15/2015 10:25 AM    Cholesterol, total 226 (H) 09/28/2014 12:30 PM    HDL Cholesterol 51 11/07/2015 12:25 PM    HDL Cholesterol 46 06/19/2015 10:17 AM    HDL Cholesterol 44 02/15/2015 10:25 AM    HDL Cholesterol 57 09/28/2014 12:30 PM    LDL, calculated 135 (H) 11/07/2015 12:25 PM    LDL, calculated 114 (H) 06/19/2015 10:17 AM    LDL, calculated 126 (H) 02/15/2015 10:25 AM    LDL, calculated 152 (H) 09/28/2014 12:30 PM    Triglyceride 102 11/07/2015 12:25 PM    Triglyceride 96 06/19/2015 10:17 AM    Triglyceride 107 02/15/2015 10:25 AM    Triglyceride 87 09/28/2014 12:30 PM     Lab Results   Component Value Date/Time    CK 82 12/30/2015 02:03 PM     Lab Results   Component Value Date/Time    Sodium 144 03/13/2016 10:56 AM    Potassium 4.4 03/13/2016 10:56 AM    Chloride 98 03/13/2016 10:56 AM    CO2 26 03/13/2016 10:56 AM    Anion gap 5 01/27/2016 03:59 AM    Glucose 120 (H) 03/13/2016 10:56 AM     BUN 22  03/13/2016 10:56 AM    Creatinine 0.88 03/13/2016 10:56 AM    BUN/Creatinine ratio 25 03/13/2016 10:56 AM    GFR est AA 79 03/13/2016 10:56 AM    GFR est non-AA 68 03/13/2016 10:56 AM    Calcium 9.3 03/13/2016 10:56 AM    Bilirubin, total 0.7 03/13/2016 10:56 AM    AST (SGOT) 18 03/13/2016 10:56 AM    Alk. phosphatase 72 03/13/2016 10:56 AM    Protein, total 7.8 03/13/2016 10:56 AM    Albumin 4.2 03/13/2016 10:56 AM    Globulin 4.1 (H) 01/23/2016 12:25 PM    A-G Ratio 1.2 03/13/2016 10:56 AM    ALT (SGPT) 9 03/13/2016 10:56 AM       EKG:  NSR      Assessment:     Assessment:      1. Diastolic CHF, chronic ([place])    2. Hypercholesterolemia    3. Essential hypertension    4. Pulmonary emphysema, unspecified emphysema type ([place])        Orders Placed This Encounter   ??? AMB POC EKG ROUTINE W/ 12 LEADS, INTER & REP     Order Specific Question:   Reason for Exam:     Answer:   routine        Plan:     Diastolic HF  Echo EF 03%, no valvular pathology 08/17  Negative nuclear scan 04/17  Wt down to 289 (297 on last visit 12/17)  Clinically compensated. Continue Furosemide. Stable kidney fxn 10/17 labwork    HTN  Home BP runs in the 130s-140s at home  Repeat is 140/80  Continue Clonidine, Amlodipine, BB, Lisinopril    HLD  On statin therapy  Noted 6/17 LDL at 135. Had blood work done this am  Lipids and labs followed by PCP.    COPD  On chronic oxygen therapy, inhalers      Doing well with no cardiac symptoms.   Continue current care and f/u in 6 months.      Dianne Dun, MD

## 2016-09-13 LAB — METABOLIC PANEL, COMPREHENSIVE
A-G Ratio: 1.1 — ABNORMAL LOW (ref 1.2–2.2)
ALT (SGPT): 7 IU/L (ref 0–32)
AST (SGOT): 15 IU/L (ref 0–40)
Albumin: 4.1 g/dL (ref 3.6–4.8)
Alk. phosphatase: 70 IU/L (ref 39–117)
BUN/Creatinine ratio: 26 (ref 12–28)
BUN: 27 mg/dL (ref 8–27)
Bilirubin, total: 0.8 mg/dL (ref 0.0–1.2)
CO2: 29 mmol/L (ref 18–29)
Calcium: 9 mg/dL (ref 8.7–10.3)
Chloride: 98 mmol/L (ref 96–106)
Creatinine: 1.03 mg/dL — ABNORMAL HIGH (ref 0.57–1.00)
GFR est AA: 65 mL/min/{1.73_m2} (ref >59)
GFR est non-AA: 56 mL/min/{1.73_m2} — ABNORMAL LOW (ref >59)
GLOBULIN, TOTAL: 3.7 g/dL (ref 1.5–4.5)
Glucose: 107 mg/dL — ABNORMAL HIGH (ref 65–99)
Potassium: 4.4 mmol/L (ref 3.5–5.2)
Protein, total: 7.8 g/dL (ref 6.0–8.5)
Sodium: 144 mmol/L (ref 134–144)

## 2016-09-13 LAB — URINALYSIS W/ RFLX MICROSCOPIC
Bilirubin: NEGATIVE
Glucose: NEGATIVE
Ketone: NEGATIVE
Nitrites: NEGATIVE
Specific Gravity: 1.018 (ref 1.005–1.030)
Urobilinogen: 1 mg/dL (ref 0.2–1.0)
pH (UA): >=9 — AB (ref 5.0–7.5)

## 2016-09-13 LAB — MICROSCOPIC EXAMINATION: Casts: NONE SEEN /lpf

## 2016-09-13 LAB — HEMOGLOBIN A1C WITH EAG
Estimated average glucose: 131 mg/dL
Hemoglobin A1c: 6.2 % — ABNORMAL HIGH (ref 4.8–5.6)

## 2016-09-13 LAB — LIPID PANEL
Cholesterol, total: 177 mg/dL (ref 100–199)
HDL Cholesterol: 48 mg/dL (ref >39)
LDL, calculated: 110 mg/dL — ABNORMAL HIGH (ref 0–99)
Triglyceride: 94 mg/dL (ref 0–149)
VLDL, calculated: 19 mg/dL (ref 5–40)

## 2016-09-13 LAB — VITAMIN D, 25 HYDROXY: VITAMIN D, 25-HYDROXY: 43.6 ng/mL (ref 30.0–100.0)

## 2016-09-13 LAB — DIABETES PATIENT EDUCATION

## 2016-09-13 LAB — CKD REPORT

## 2016-10-22 NOTE — Progress Notes (Signed)
Spoke with patient after verifying name and DOB. Patient reports leaving for vacation on Monday, 10/27/16 for 1 week of vacation. Patient is having extra oxygen tanks delivered.  Patient able to verbalize plan to exercise, watch sodium intake, and adhere to fluid restriction to help her not be admitted to hospital.She will be careful regarding being in the sun and heat. Patient will follow up with this writer after her return on 10/31/16.

## 2016-11-05 NOTE — Progress Notes (Signed)
Spoke with patient after verifying name and DOB. Patient report she is feeling well. Did gain some weight while on vacation( did not want to share number). She knows it is from not following low Na diet and now she is home is has been complaint with diet and fluid restriction. Patient will cal this writer if needed. This Clinical research associatewriter will follow up with patient in 1 month.

## 2016-11-12 ENCOUNTER — Encounter

## 2016-11-12 MED ORDER — METOPROLOL SUCCINATE SR 200 MG 24 HR TAB
200 mg | ORAL_TABLET | ORAL | 0 refills | Status: DC
Start: 2016-11-12 — End: 2017-02-08

## 2016-11-12 MED ORDER — FUROSEMIDE 40 MG TAB
40 mg | ORAL_TABLET | ORAL | 0 refills | Status: DC
Start: 2016-11-12 — End: 2017-02-08

## 2016-12-05 NOTE — Progress Notes (Signed)
Goals     ??? Returns to baseline activity level.            12/05/16 Patient reports she has gained weight d/t no portion control and having sodas back in diet. Patient refused to give writer number.She has followed low Na and fluid restrictions. Blood sugar has been between 120-140. Patient continues to walk. Patient is to cut back on soda,use portion control, and record weights daily and report at next week outreach. SAJ

## 2016-12-10 ENCOUNTER — Ambulatory Visit: Admit: 2016-12-10 | Discharge: 2016-12-10 | Payer: MEDICARE | Attending: Internal Medicine | Primary: Internal Medicine

## 2016-12-10 DIAGNOSIS — E119 Type 2 diabetes mellitus without complications: Secondary | ICD-10-CM

## 2016-12-10 LAB — AMB POC GLUCOSE BLOOD, BY GLUCOSE MONITORING DEVICE: Glucose POC: 103 mg/dL

## 2016-12-10 LAB — AMB POC HEMOGLOBIN A1C: Hemoglobin A1c (POC): 5.8 %

## 2016-12-10 MED ORDER — ACETAMINOPHEN-CODEINE 300 MG-30 MG TAB
300-30 mg | ORAL_TABLET | Freq: Every evening | ORAL | 0 refills | Status: DC | PRN
Start: 2016-12-10 — End: 2016-12-22

## 2016-12-10 MED ORDER — METHOCARBAMOL 500 MG TAB
500 mg | ORAL_TABLET | Freq: Four times a day (QID) | ORAL | 1 refills | Status: DC
Start: 2016-12-10 — End: 2016-12-22

## 2016-12-10 NOTE — Patient Instructions (Addendum)
Low Sodium Diet (2,000 Milligram): Care Instructions  Your Care Instructions    Too much sodium causes your body to hold on to extra water. This can raise your blood pressure and force your heart and kidneys to work harder. In very serious cases, this could cause you to be put in the hospital. It might even be life-threatening. By limiting sodium, you will feel better and lower your risk of serious problems.  The most common source of sodium is salt. People get most of the salt in their diet from canned, prepared, and packaged foods. Fast food and restaurant meals also are very high in sodium. Your doctor will probably limit your sodium to less than 2,000 milligrams (mg) a day. This limit counts all the sodium in prepared and packaged foods and any salt you add to your food.  Follow-up care is a key part of your treatment and safety. Be sure to make and go to all appointments, and call your doctor if you are having problems. It's also a good idea to know your test results and keep a list of the medicines you take.  How can you care for yourself at home?  Read food labels  ?? Read labels on cans and food packages. The labels tell you how much sodium is in each serving. Make sure that you look at the serving size. If you eat more than the serving size, you have eaten more sodium.  ?? Food labels also tell you the Percent Daily Value for sodium. Choose products with low Percent Daily Values for sodium.  ?? Be aware that sodium can come in forms other than salt, including monosodium glutamate (MSG), sodium citrate, and sodium bicarbonate (baking soda). MSG is often added to Asian food. When you eat out, you can sometimes ask for food without MSG or added salt.  Buy low-sodium foods  ?? Buy foods that are labeled "unsalted" (no salt added), "sodium-free" (less than 5 mg of sodium per serving), or "low-sodium" (less than 140 mg of sodium per serving). Foods labeled "reduced-sodium" and "light sodium"  may still have too much sodium. Be sure to read the label to see how much sodium you are getting.  ?? Buy fresh vegetables, or frozen vegetables without added sauces. Buy low-sodium versions of canned vegetables, soups, and other canned goods.  Prepare low-sodium meals  ?? Cut back on the amount of salt you use in cooking. This will help you adjust to the taste. Do not add salt after cooking. One teaspoon of salt has about 2,300 mg of sodium.  ?? Take the salt shaker off the table.  ?? Flavor your food with garlic, lemon juice, onion, vinegar, herbs, and spices. Do not use soy sauce, lite soy sauce, steak sauce, onion salt, garlic salt, celery salt, mustard, or ketchup on your food.  ?? Use low-sodium salad dressings, sauces, and ketchup. Or make your own salad dressings and sauces without adding salt.  ?? Use less salt (or none) when recipes call for it. You can often use half the salt a recipe calls for without losing flavor. Other foods such as rice, pasta, and grains do not need added salt.  ?? Rinse canned vegetables, and cook them in fresh water. This removes some-but not all-of the salt.  ?? Avoid water that is naturally high in sodium or that has been treated with water softeners, which add sodium. Call your local water company to find out the sodium content of your water supply. If you buy   bottled water, read the label and choose a sodium-free brand.  Avoid high-sodium foods  ?? Avoid eating:  ?? Smoked, cured, salted, and canned meat, fish, and poultry.  ?? Ham, bacon, hot dogs, and luncheon meats.  ?? Regular, hard, and processed cheese and regular peanut butter.  ?? Crackers with salted tops, and other salted snack foods such as pretzels, chips, and salted popcorn.  ?? Frozen prepared meals, unless labeled low-sodium.  ?? Canned and dried soups, broths, and bouillon, unless labeled sodium-free or low-sodium.  ?? Canned vegetables, unless labeled sodium-free or low-sodium.   ?? French fries, pizza, tacos, and other fast foods.  ?? Pickles, olives, ketchup, and other condiments, especially soy sauce, unless labeled sodium-free or low-sodium.  Where can you learn more?  Go to http://www.healthwise.net/[employer].  Enter V843 in the search box to learn more about "Low Sodium Diet (2,000 Milligram): Care Instructions."  Current as of: Oct 12, 2015  Content Version: 11.7  ?? 2006-2018 [employer]. Care instructions adapted under license by [employer] (which disclaims liability or warranty for this information). If you have questions about a medical condition or this instruction, always ask your healthcare professional. [employer] disclaims any warranty or liability for your use of this information.

## 2016-12-10 NOTE — Progress Notes (Signed)
Chief Complaint   Patient presents with   ??? Hypertension   ??? Diabetes     follow up      HPI:  Brittney Shelton is a 66 y.o. AA female with??Diabetes (A!C today=5.8%), hypertension, copd on home oxygen, hypercholesterolemia presents for routine follow up  Patient has been doing good, blood pressure is at goal on current medication, she has no complaints.     Review of Systems  As per hpi    Past Medical History:   Diagnosis Date   ??? Chronic obstructive pulmonary disease ([place])    ??? Congestive heart failure ([place])    ??? Diabetes ([place])    ??? Hypertension      History reviewed. No pertinent surgical history.  Social History     Social History   ??? Marital status: SINGLE     Spouse name: N/A   ??? Number of children: N/A   ??? Years of education: N/A     Social History Main Topics   ??? Smoking status: Never Smoker   ??? Smokeless tobacco: Never Used   ??? Alcohol use No   ??? Drug use: No   ??? Sexual activity: No     Other Topics Concern   ??? None     Social History Narrative     Family History   Problem Relation Age of Onset   ??? Diabetes Mother    ??? Arthritis-osteo Father    ??? COPD Sister    ??? Diabetes Brother    ??? Diabetes Brother      Current Outpatient Prescriptions   Medication Sig Dispense Refill   ??? furosemide (LASIX) 40 mg tablet TAKE 1 TABLET BY MOUTH TWICE DAILY FOR SWELLING 180 Tab 0   ??? metoprolol succinate (TOPROL-XL) 200 mg XL tablet TAKE 1 TABLET BY MOUTH ONCE DAILY FOR HIGH BLOOD PRESSURE 90 Tab 0   ??? amLODIPine (NORVASC) 10 mg tablet TAKE 1 TABLET BY MOUTH DAILY 90 Tab 3   ??? acetaminophen-codeine (TYLENOL #3) 300-30 mg per tablet Take 1 Tab by mouth every four (4) hours as needed for Pain. Max Daily Amount: 6 Tabs. Indications: Pain 15 Tab 0   ??? isosorbide dinitrate (ISORDIL) 30 mg tablet TAKE 1 TABLET BY MOUTH THREE TIMES DAILY 270 Tab 3   ??? lisinopril (PRINIVIL, ZESTRIL) 40 mg tablet TAKE 1 TABLET BY MOUTH ONCE DAILY 90 Tab 3   ??? cloNIDine HCl (CATAPRES) 0.1 mg tablet TAKE 1 TABLET BY MOUTH ONCE DAILY 90 Tab 0    ??? umeclidinium-vilanterol (ANORO ELLIPTA) 62.5-25 mcg/actuation inhaler Take 1 Puff by inhalation daily. Indications: BRONCHOSPASM PREVENTION WITH COPD 1 Inhaler 5   ??? OXYGEN-AIR DELIVERY SYSTEMS Take 4 L by inhalation continuous.     ??? naproxen sodium (ALEVE) 220 mg tablet Take 440 mg by mouth every twelve (12) hours as needed for Pain.     ??? cholecalciferol, VITAMIN D3, (VITAMIN D3) 5,000 unit tab tablet Take 1 Tab by mouth daily. 30 Tab 5   ??? pravastatin (PRAVACHOL) 20 mg tablet TAKE 1 TABLET BY MOUTH ONCE DAILY AT NIGHT 90 Tab 5   ??? albuterol-ipratropium (DUO-NEB) 2.5 mg-0.5 mg/3 ml nebu Take 3 mL by inhalation three (3) times daily.     ??? albuterol (PROVENTIL HFA, VENTOLIN HFA, PROAIR HFA) 90 mcg/actuation inhaler Take 1 Puff by inhalation every six (6) hours as needed for Wheezing. 1 Inhaler 0   ??? aspirin 81 mg chewable tablet Take 81 mg by mouth daily.  No Known Allergies    Objective:  Visit Vitals   ??? BP 143/66   ??? Pulse 68   ??? Temp 98 ??F (36.7 ??C) (Oral)   ??? Resp 20   ??? Ht '5\' 6"'  (1.676 m)   ??? Wt 300 lb (136.1 kg)   ??? SpO2 90%  Comment: 4 liters   ??? BMI 48.42 kg/m2     Physical Exam:   General appearance - alert, well appearing, and in no distress  EYE-PERLA, EOMI  ENT-ENT exam normal, no neck nodes or sinus tenderness  Nose - nasal prone in place  Neck - supple, no significant adenopathy   Chest - clear to auscultation, no wheezes, rales or rhonchi   Heart - systolic murmur, normal blood pressure  Abdomen - soft, nontender, nondistended, no organomegaly  Ext-peripheral pulses normal, lymph edema bilaterally  Neuro -alert, oriented, normal speech, no focal findings  Back-full range of motion, no tenderness, palpable spasm or pain on motion     Results for orders placed or performed in visit on 08/06/16   VITAMIN D, 25 HYDROXY   Result Value Ref Range    VITAMIN D, 25-HYDROXY 43.6 30.0 - 100.0 ng/mL   URINALYSIS W/ RFLX MICROSCOPIC   Result Value Ref Range    Specific Gravity 1.018 1.005 - 1.030     pH (UA) =>9.0 (A) 5.0 - 7.5    Color Yellow Yellow    Appearance Turbid (A) Clear    Leukocyte Esterase Trace (A) Negative    Protein 2+ (A) Negative/Trace    Glucose Negative Negative    Ketone Negative Negative    Blood Trace (A) Negative    Bilirubin Negative Negative    Urobilinogen 1.0 0.2 - 1.0 mg/dL    Nitrites Negative Negative    Microscopic Examination See additional order    METABOLIC PANEL, COMPREHENSIVE   Result Value Ref Range    Glucose 107 (H) 65 - 99 mg/dL    BUN 27 8 - 27 mg/dL    Creatinine 1.03 (H) 0.57 - 1.00 mg/dL    GFR est non-AA 56 (L) >59 mL/min/1.73    GFR est AA 65 >59 mL/min/1.73    BUN/Creatinine ratio 26 12 - 28    Sodium 144 134 - 144 mmol/L    Potassium 4.4 3.5 - 5.2 mmol/L    Chloride 98 96 - 106 mmol/L    CO2 29 18 - 29 mmol/L    Calcium 9.0 8.7 - 10.3 mg/dL    Protein, total 7.8 6.0 - 8.5 g/dL    Albumin 4.1 3.6 - 4.8 g/dL    GLOBULIN, TOTAL 3.7 1.5 - 4.5 g/dL    A-G Ratio 1.1 (L) 1.2 - 2.2    Bilirubin, total 0.8 0.0 - 1.2 mg/dL    Alk. phosphatase 70 39 - 117 IU/L    AST (SGOT) 15 0 - 40 IU/L    ALT (SGPT) 7 0 - 32 IU/L   LIPID PANEL   Result Value Ref Range    Cholesterol, total 177 100 - 199 mg/dL    Triglyceride 94 0 - 149 mg/dL    HDL Cholesterol 48 >39 mg/dL    VLDL, calculated 19 5 - 40 mg/dL    LDL, calculated 110 (H) 0 - 99 mg/dL   HEMOGLOBIN A1C WITH EAG   Result Value Ref Range    Hemoglobin A1c 6.2 (H) 4.8 - 5.6 %    Estimated average glucose 131 mg/dL   MICROSCOPIC EXAMINATION  Result Value Ref Range    WBC 0-5 0 - 5 /hpf    RBC 0-2 0 - 2 /hpf    Epithelial cells 0-10 0 - 10 /hpf    Casts None seen None seen /lpf    Crystals Present (A) N/A    Crystal type Amorphous Sediment N/A    Bacteria Few None seen/Few   CKD REPORT   Result Value Ref Range    Interpretation Note    DIABETES PATIENT EDUCATION   Result Value Ref Range    PDF Image Not applicable      Assessment/Plan:  Diagnoses and all orders for this visit:    Well controlled type 2 diabetes mellitus ([place])   -     AMB POC GLUCOSE BLOOD, BY GLUCOSE MONITORING DEVICE  -     Cancel: HEMOGLOBIN A1C WITH EAG  -     AMB POC HEMOGLOBIN Y6A  -     METABOLIC PANEL, COMPREHENSIVE    Obesity, morbid (H[facility])    Hypercholesterolemia  -     LIPID PANEL    Chronic acquired lymphedema    Dependence on supplemental oxygen    Chronic radicular pain of lower back  -     acetaminophen-codeine (TYLENOL #3) 300-30 mg per tablet; Take 1 Tab by mouth nightly as needed for Pain. Max Daily Amount: 1 Tab., Print, Disp-30 Tab, R-0  -     methocarbamol (ROBAXIN) 500 mg tablet; Take 1 Tab by mouth four (4) times daily., Normal, Disp-20 Tab, R-1  -     URINALYSIS W/ RFLX MICROSCOPIC    Encounter for diabetic foot exam ([place])  -     REFERRAL TO PODIATRY    Hypertension goal BP (blood pressure) < 130/80      Patient Instructions        Low Sodium Diet (2,000 Milligram): Care Instructions  Your Care Instructions    Too much sodium causes your body to hold on to extra water. This can raise your blood pressure and force your heart and kidneys to work harder. In very serious cases, this could cause you to be put in the hospital. It might even be life-threatening. By limiting sodium, you will feel better and lower your risk of serious problems.  The most common source of sodium is salt. People get most of the salt in their diet from canned, prepared, and packaged foods. Fast food and restaurant meals also are very high in sodium. Your doctor will probably limit your sodium to less than 2,000 milligrams (mg) a day. This limit counts all the sodium in prepared and packaged foods and any salt you add to your food.  Follow-up care is a key part of your treatment and safety. Be sure to make and go to all appointments, and call your doctor if you are having problems. It's also a good idea to know your test results and keep a list of the medicines you take.  How can you care for yourself at home?  Read food labels   ?? Read labels on cans and food packages. The labels tell you how much sodium is in each serving. Make sure that you look at the serving size. If you eat more than the serving size, you have eaten more sodium.  ?? Food labels also tell you the Percent Daily Value for sodium. Choose products with low Percent Daily Values for sodium.  ?? Be aware that sodium can come in forms other than salt, including monosodium glutamate (  MSG), sodium citrate, and sodium bicarbonate (baking soda). MSG is often added to Asian food. When you eat out, you can sometimes ask for food without MSG or added salt.  Buy low-sodium foods  ?? Buy foods that are labeled "unsalted" (no salt added), "sodium-free" (less than 5 mg of sodium per serving), or "low-sodium" (less than 140 mg of sodium per serving). Foods labeled "reduced-sodium" and "light sodium" may still have too much sodium. Be sure to read the label to see how much sodium you are getting.  ?? Buy fresh vegetables, or frozen vegetables without added sauces. Buy low-sodium versions of canned vegetables, soups, and other canned goods.  Prepare low-sodium meals  ?? Cut back on the amount of salt you use in cooking. This will help you adjust to the taste. Do not add salt after cooking. One teaspoon of salt has about 2,300 mg of sodium.  ?? Take the salt shaker off the table.  ?? Flavor your food with garlic, lemon juice, onion, vinegar, herbs, and spices. Do not use soy sauce, lite soy sauce, steak sauce, onion salt, garlic salt, celery salt, mustard, or ketchup on your food.  ?? Use low-sodium salad dressings, sauces, and ketchup. Or make your own salad dressings and sauces without adding salt.  ?? Use less salt (or none) when recipes call for it. You can often use half the salt a recipe calls for without losing flavor. Other foods such as rice, pasta, and grains do not need added salt.  ?? Rinse canned vegetables, and cook them in fresh water. This removes some-but not all-of the salt.   ?? Avoid water that is naturally high in sodium or that has been treated with water softeners, which add sodium. Call your local water company to find out the sodium content of your water supply. If you buy bottled water, read the label and choose a sodium-free brand.  Avoid high-sodium foods  ?? Avoid eating:  ?? Smoked, cured, salted, and canned meat, fish, and poultry.  ?? Ham, bacon, hot dogs, and luncheon meats.  ?? Regular, hard, and processed cheese and regular peanut butter.  ?? Crackers with salted tops, and other salted snack foods such as pretzels, chips, and salted popcorn.  ?? Frozen prepared meals, unless labeled low-sodium.  ?? Canned and dried soups, broths, and bouillon, unless labeled sodium-free or low-sodium.  ?? Canned vegetables, unless labeled sodium-free or low-sodium.  ?? [place] fries, pizza, tacos, and other fast foods.  ?? Pickles, olives, ketchup, and other condiments, especially soy sauce, unless labeled sodium-free or low-sodium.  Where can you learn more?  Go to [employer].  Enter [phone] in the search box to learn more about "Low Sodium Diet (2,000 Milligram): Care Instructions."  Current as of: Oct 12, 2015  Content Version: 11.7  ?? 2006-2018 [employer]. Care instructions adapted under license by [employer] (which disclaims liability or warranty for this information). If you have questions about a medical condition or this instruction, always ask your healthcare professional. [place] any warranty or liability for your use of this information.      Follow-up Disposition:  Return in about 4 months (around 04/12/2017), or if symptoms worsen or fail to improve, for routine follow up.

## 2016-12-10 NOTE — Progress Notes (Signed)
Goals     ??? Returns to baseline activity level.            12/05/16 Patient reports she has gained weight d/t no portion control and having sodas back in diet. Patient refused to give writer number.She has followed low Na and fluid restrictions. Blood sugar has been between 120-140. Patient continues to walk. Patient is to cut back on soda,use portion control, and record weights daily and report at next week outreach. SAJ    12/10/16 Patient reports back pain today. Seeing PCP today. She is following low Na diet. Patient has agreed to give up soda. Will report outreach in 2 weeks. SAJ

## 2016-12-13 ENCOUNTER — Emergency Department: Admit: 2016-12-13 | Payer: MEDICARE | Primary: Internal Medicine

## 2016-12-13 ENCOUNTER — Inpatient Hospital Stay
Admit: 2016-12-13 | Discharge: 2016-12-13 | Disposition: A | Discharge status: Home / Self Care | Payer: MEDICARE | Attending: Emergency Medicine

## 2016-12-13 DIAGNOSIS — M5442 Lumbago with sciatica, left side: Secondary | ICD-10-CM

## 2016-12-13 LAB — URINALYSIS W/ REFLEX CULTURE
Bacteria: NEGATIVE /hpf
Blood: NEGATIVE
Glucose: NEGATIVE mg/dL
Nitrites: NEGATIVE
Protein: 30 mg/dL — AB
Specific gravity: 1.027 (ref 1.003–1.030)
Urobilinogen: 1 EU/dL (ref 0.2–1.0)
pH (UA): 5 (ref 5.0–8.0)

## 2016-12-13 LAB — BILIRUBIN, CONFIRM: Bilirubin UA, confirm: NEGATIVE

## 2016-12-13 MED ORDER — HYDROCODONE-ACETAMINOPHEN 5 MG-325 MG TAB
5-325 mg | ORAL | Status: AC
Start: 2016-12-13 — End: 2016-12-13
  Administered 2016-12-13: 14:00:00 via ORAL

## 2016-12-13 MED ORDER — TIZANIDINE 4 MG TAB
4 mg | ORAL_TABLET | Freq: Three times a day (TID) | ORAL | 0 refills | Status: DC | PRN
Start: 2016-12-13 — End: 2016-12-22

## 2016-12-13 MED ORDER — HYDROCODONE-ACETAMINOPHEN 5 MG-325 MG TAB
5-325 mg | ORAL_TABLET | ORAL | 0 refills | Status: DC | PRN
Start: 2016-12-13 — End: 2017-01-07

## 2016-12-13 MED FILL — HYDROCODONE-ACETAMINOPHEN 5 MG-325 MG TAB: 5-325 mg | ORAL | Qty: 2

## 2016-12-13 NOTE — ED Notes (Signed)
S/w pts brother who is on his way to come pick her up.

## 2016-12-13 NOTE — ED Notes (Signed)
Pt discharged by MD/PA with discharge paperwork, pt a/o x 4 at time of d/c.

## 2016-12-13 NOTE — ED Provider Notes (Signed)
EMERGENCY DEPARTMENT HISTORY AND PHYSICAL EXAM      Date: 12/13/2016  Patient Name: Brittney Shelton    History of Presenting Illness     No chief complaint on file.      History Provided By: Patient    HPI: Brittney Shelton, 66 y.o. female with PMHx significant for COPD, CHF, HTN, and DM, presents via EMS to the ED with cc of new onset, sharp, spasming, diffuse lower back pain intermittently radiating down her B/L lower extremities progressively worsening over the last 5-6 days. Pt reports associated sx of sleep disturbance secondary to pain as well. She expresses her pain is exacerbated with movement leading her to her PCP's office 3 days ago. Pt discloses she was diagnosed with muscle spasms and given Tylenol #3 and Methocarbamol for her discomfort and discharged home. She denies any imaging done at PCP visit. Pt reports no relief in her sx with the medications leading her to the ED. Of note pt is ambulatory with cane assistance at baseline. She ensures she has had no recent falls or trauma to cause her pain. She denies any fevers, chills, chest pain, SOB, abdominal pain, nausea, vomiting, diarrhea, dysuria, frequency, saddle paraesthesias, or urinary / fecal incontinence.     Chief Complaint: lower back pain  Duration: since Monday   Timing:  Gradual and Constant  Location: center lower back  Quality: Aching and Cramping  Severity: 10 out of 10  Modifying Factors: pt notes movement worsens pain  Associated Symptoms: denies any other associated signs or symptoms        There are no other complaints, changes, or physical findings at this time.    PCP: Erenest Rasher, MD   SHx: (-) Tobacco; (-) ETOH; (-) Illicit drug use    Current Outpatient Prescriptions   Medication Sig Dispense Refill   ??? acetaminophen-codeine (TYLENOL #3) 300-30 mg per tablet Take 1 Tab by mouth nightly as needed for Pain. Max Daily Amount: 1 Tab. 30 Tab 0   ??? methocarbamol (ROBAXIN) 500 mg tablet Take 1 Tab by mouth four (4) times daily. 20 Tab 1    ??? furosemide (LASIX) 40 mg tablet TAKE 1 TABLET BY MOUTH TWICE DAILY FOR SWELLING 180 Tab 0   ??? metoprolol succinate (TOPROL-XL) 200 mg XL tablet TAKE 1 TABLET BY MOUTH ONCE DAILY FOR HIGH BLOOD PRESSURE 90 Tab 0   ??? amLODIPine (NORVASC) 10 mg tablet TAKE 1 TABLET BY MOUTH DAILY 90 Tab 3   ??? isosorbide dinitrate (ISORDIL) 30 mg tablet TAKE 1 TABLET BY MOUTH THREE TIMES DAILY 270 Tab 3   ??? lisinopril (PRINIVIL, ZESTRIL) 40 mg tablet TAKE 1 TABLET BY MOUTH ONCE DAILY 90 Tab 3   ??? cloNIDine HCl (CATAPRES) 0.1 mg tablet TAKE 1 TABLET BY MOUTH ONCE DAILY 90 Tab 0   ??? umeclidinium-vilanterol (ANORO ELLIPTA) 62.5-25 mcg/actuation inhaler Take 1 Puff by inhalation daily. Indications: BRONCHOSPASM PREVENTION WITH COPD 1 Inhaler 5   ??? OXYGEN-AIR DELIVERY SYSTEMS Take 4 L by inhalation continuous.     ??? naproxen sodium (ALEVE) 220 mg tablet Take 440 mg by mouth every twelve (12) hours as needed for Pain.     ??? cholecalciferol, VITAMIN D3, (VITAMIN D3) 5,000 unit tab tablet Take 1 Tab by mouth daily. 30 Tab 5   ??? pravastatin (PRAVACHOL) 20 mg tablet TAKE 1 TABLET BY MOUTH ONCE DAILY AT NIGHT 90 Tab 5   ??? albuterol-ipratropium (DUO-NEB) 2.5 mg-0.5 mg/3 ml nebu Take 3 mL by  inhalation three (3) times daily.     ??? albuterol (PROVENTIL HFA, VENTOLIN HFA, PROAIR HFA) 90 mcg/actuation inhaler Take 1 Puff by inhalation every six (6) hours as needed for Wheezing. 1 Inhaler 0   ??? aspirin 81 mg chewable tablet Take 81 mg by mouth daily.         Past History     Past Medical History:  Past Medical History:   Diagnosis Date   ??? Chronic obstructive pulmonary disease (HCC)    ??? Congestive heart failure (HCC)    ??? Diabetes (HCC)    ??? Hypertension        Past Surgical History:  No past surgical history on file.    Family History:  Family History   Problem Relation Age of Onset   ??? Diabetes Mother    ??? Arthritis-osteo Father    ??? COPD Sister    ??? Diabetes Brother    ??? Diabetes Brother        Social History:  Social History    Substance Use Topics   ??? Smoking status: Never Smoker   ??? Smokeless tobacco: Never Used   ??? Alcohol use No       Allergies:  No Known Allergies      Review of Systems   Review of Systems   Constitutional: Negative for activity change, chills and fever.   HENT: Negative for congestion and sore throat.    Eyes: Negative for pain and redness.   Respiratory: Negative for cough, chest tightness and shortness of breath.    Cardiovascular: Negative for chest pain and palpitations.   Gastrointestinal: Negative for abdominal pain, diarrhea, nausea and vomiting.        (-) fecal incontinence   Genitourinary: Negative for dysuria, frequency and urgency.        (-) urinary incontinence    Musculoskeletal: Positive for back pain (lower radiating down LLE). Negative for neck pain.   Skin: Negative for rash.   Neurological: Negative for syncope, light-headedness, numbness (saddle paraesthesias) and headaches.   Psychiatric/Behavioral: Positive for sleep disturbance (secondary to pain ). Negative for confusion.   All other systems reviewed and are negative.      Physical Exam   Physical Exam   Constitutional: She is oriented to person, place, and time. She appears well-developed. No distress.   (+) morbidly obese   HENT:   Head: Normocephalic.   Nose: Nose normal.   Mouth/Throat: Oropharynx is clear and moist. No oropharyngeal exudate.   Eyes: Conjunctivae are normal. Pupils are equal, round, and reactive to light. No scleral icterus.   Neck: Normal range of motion. Neck supple. No JVD present. No tracheal deviation present. No thyromegaly present.   Cardiovascular: Normal rate, regular rhythm and intact distal pulses.  Exam reveals no gallop and no friction rub.    No murmur heard.  Pulmonary/Chest: Effort normal and breath sounds normal. No stridor. No respiratory distress. She has no wheezes. She has no rales.   4L nasal cannula   Abdominal: Soft. Bowel sounds are normal. She exhibits no distension.  There is no tenderness. There is no rebound and no guarding.   Musculoskeletal: Normal range of motion. She exhibits tenderness (mild diffuse lower lumbar; no step-offs ). She exhibits no edema.   (+) Right SLR   (-) Left SLR    Lymphadenopathy:     She has no cervical adenopathy.   Neurological: She is alert and oriented to person, place, and time. No cranial nerve deficit.  She exhibits normal muscle tone. Coordination normal.   Skin: Skin is warm and dry. No rash noted. She is not diaphoretic. No erythema.   Psychiatric: She has a normal mood and affect. Her behavior is normal.   Nursing note and vitals reviewed.      Diagnostic Study Results     Labs -     Recent Results (from the past 12 hour(s))   URINALYSIS W/ REFLEX CULTURE    Collection Time: 12/13/16  9:36 AM   Result Value Ref Range    Color DARK YELLOW      Appearance CLEAR CLEAR      Specific gravity 1.027 1.003 - 1.030      pH (UA) 5.0 5.0 - 8.0      Protein 30 (A) NEG mg/dL    Glucose NEGATIVE  NEG mg/dL    Ketone TRACE (A) NEG mg/dL    Blood NEGATIVE  NEG      Urobilinogen 1.0 0.2 - 1.0 EU/dL    Nitrites NEGATIVE  NEG      Leukocyte Esterase TRACE (A) NEG      WBC 0-4 0 - 4 /hpf    RBC 0-5 0 - 5 /hpf    Epithelial cells FEW FEW /lpf    Bacteria NEGATIVE  NEG /hpf    UA:UC IF INDICATED CULTURE NOT INDICATED BY UA RESULT CNI      Amorphous Crystals 3+ (A) NEG    Hyaline cast 5-10 0 - 5 /lpf   BILIRUBIN, CONFIRM    Collection Time: 12/13/16  9:36 AM   Result Value Ref Range    Bilirubin UA, confirm NEGATIVE  NEG         Radiologic Studies -   CT Results  (Last 48 hours)               12/13/16 0835  CT SPINE LUMB WO CONT Final result    Impression:  IMPRESSION:    1. 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   2. Diverticulosis and other incidental findings described above.           Narrative:  ADDITIONAL INDICATION:  Sciatica, and/or radiculopathy, <6wks. Low back pain and    spasm. No injury. Urinary retention.       COMPARISON: None.       TECHNIQUE: Multislice helical CT of the lumbar spine was performed. Sagittal and   coronal reformations were generated. CT dose reduction was achieved through use   of a standardized protocol tailored for this examination and automatic exposure   control for dose modulation.        FINDINGS:   The alignment of the lumbar spine is remarkable for 2.8 mm retrolisthesis at   T11-12, where there is moderate central stenosis with spondylosis and bulge of   disc material. Bony foramina are narrowed bilaterally.. The vertebral body   heights and disc spaces are well-preserved. Incidentally noted subcentimeter low   density in the left renal cortex too small to characterize but possibly   representing a cyst. Diverticula in the sigmoid colon without associated acute   inflammatory change obvious. Heavy vascular calcification without aneurysm.       The findings at each level are as follows: Multilevel facet arthropathy,   spondylosis and bulge.       T11-12: Moderate central stenosis, grade 1 retrolisthesis, see above.       L2-3: Mild central stenosis.  L3-4: Moderate central stenosis with diffuse bulge and facet arthropathy,   bilateral foraminal stenosis.       L4-5: Moderate central stenosis, spondylosis and bulge with fusion at the   intervertebral disc space. Significant facet arthropathy with bilateral   foraminal stenosis.       L5-S1: Mild to moderate central stenosis with spondylosis and bulge.                 Medical Decision Making   I am the first provider for this patient.    I reviewed the vital signs, available nursing notes, past medical history, past surgical history, family history and social history.    Vital Signs-Reviewed the patient's vital signs.  Patient Vitals for the past 12 hrs:   Pulse Resp BP SpO2   12/13/16 1000 - - 178/82 (!) 89 %   12/13/16 0752 79 12 175/74 (!) 89 %       Pulse Oximetry Analysis - 89% on room air     Cardiac Monitor:   Rate: 79 bpm  Rhythm: Normal Sinus Rhythm        Records Reviewed: Nursing Notes, Old Medical Records, Ambulance Run Sheet, Previous Radiology Studies and Previous Laboratory Studies    Provider Notes (Medical Decision Making):     DDx: Lumbar strain, UTI, sciatica    ED Course:   Initial assessment performed. The patients presenting problems have been discussed, and they are in agreement with the care plan formulated and outlined with them.  I have encouraged them to ask questions as they arise throughout their visit.    Progress Notes:    9:57 AM   The pt has been re-evaluated. Pt has been updated on reassuring imaging findings and informed of the plan for discharge with symptomatic management. Awaiting Cr results before final disposition.     Critical Care Time: 0 minutes    Disposition:  Discharge Note:  11:03 AM  The patient is ready for discharge. The patient's signs, symptoms, diagnosis, and discharge instruction have been discussed and the patient has conveyed their understanding. The patient is to follow up as recommended or return to the ER should their symptoms worsen. Plan has been discussed and the patient is in agreement.  Written by Burman Foster Barrett ED Scribe, as dictated by Delena Bali, MD      Exam c/w musculoskeletal back pain; ct l spine with multilevel djd and spinal stenosis; no neuro deficits on exam; dc home with pcp follow up. Delena Bali MD      PLAN:  1.   Current Discharge Medication List      START taking these medications    Details   HYDROcodone-acetaminophen (NORCO) 5-325 mg per tablet Take 1 Tab by mouth every four (4) hours as needed for Pain. Max Daily Amount: 6 Tabs.  Qty: 15 Tab, Refills: 0    Associated Diagnoses: Sciatica of left side      tiZANidine (ZANAFLEX) 4 mg tablet Take 1 Tab by mouth three (3) times daily as needed. Prn muscle spasm  Qty: 30 Tab, Refills: 0           2.   Follow-up Information     Follow up With Details Comments Contact Info     Erenest Rasher, MD Schedule an appointment as soon as possible for a visit in 2 days  [address]  [address]  [place] [address]  [phone]          Return to  ED if worse     Diagnosis     Clinical Impression:   1. Sciatica of left side    2. Osteoarthritis of lumbar spine, unspecified spinal osteoarthritis complication status        Attestations:    Attestation:  This note is prepared by Alcario DroughtErica A. Barrett, acting as Neurosurgeoncribe for Delena Baliurt Hasana Alcorta, MD.    Delena Baliurt Melvia Matousek, MD: The scribe's documentation has been prepared under my direction and personally reviewed by me in its entirety. I confirm that the note above accurately reflects all work, treatment, procedures, and medical decision making performed by me.

## 2016-12-15 ENCOUNTER — Encounter: Attending: Internal Medicine | Primary: Internal Medicine

## 2016-12-15 NOTE — Telephone Encounter (Signed)
-----   Message from Edmund HildaFranka Logan sent at 12/15/2016  9:18 AM EDT -----  Regarding: Dr Domah/Telephone  Pt was seen on Wednesday, for lower back pain and spasm. Was seen at ER on Saturday, requested that she sees her PCP. Pt feels like she needs to be in Rehab.    Best contact # [phone]

## 2016-12-15 NOTE — Telephone Encounter (Signed)
Patient did not transportation to come to the office. Was given dispatch # if she needed to be seen after  visits to the ER.

## 2016-12-16 NOTE — Telephone Encounter (Signed)
noted 

## 2016-12-16 NOTE — Telephone Encounter (Signed)
-----   Message from Isabella Bowensominique A Lee sent at 12/16/2016  1:16 PM EDT -----  Regarding: Dr. Roanna Raideromah/Telephone  Pt requesting a call back concerning problem getting out of bed due to back and legs pain. Also stated she will need the paramedics when she comes to appt. Best contact [phone]

## 2016-12-17 ENCOUNTER — Encounter: Attending: Internal Medicine | Primary: Internal Medicine

## 2016-12-17 ENCOUNTER — Emergency Department: Admit: 2016-12-17 | Payer: MEDICARE | Primary: Internal Medicine

## 2016-12-17 ENCOUNTER — Inpatient Hospital Stay
Admit: 2016-12-17 | Discharge: 2016-12-22 | Disposition: A | Discharge status: Skilled Nursing Facility | Payer: MEDICARE | Attending: Internal Medicine | Admitting: Internal Medicine

## 2016-12-17 DIAGNOSIS — M5137 Other intervertebral disc degeneration, lumbosacral region: Principal | ICD-10-CM

## 2016-12-17 LAB — CBC WITH AUTOMATED DIFF
ABS. BASOPHILS: 0 10*3/uL (ref 0.0–0.1)
ABS. EOSINOPHILS: 0.1 10*3/uL (ref 0.0–0.4)
ABS. IMM. GRANS.: 0.1 10*3/uL — ABNORMAL HIGH (ref 0.00–0.04)
ABS. LYMPHOCYTES: 0.7 10*3/uL — ABNORMAL LOW (ref 0.8–3.5)
ABS. MONOCYTES: 0.8 10*3/uL (ref 0.0–1.0)
ABS. NEUTROPHILS: 8.1 10*3/uL — ABNORMAL HIGH (ref 1.8–8.0)
ABSOLUTE NRBC: 0 10*3/uL (ref 0.00–0.01)
BASOPHILS: 0 % (ref 0–1)
EOSINOPHILS: 1 % (ref 0–7)
HCT: 40.2 % (ref 35.0–47.0)
HGB: 12.5 g/dL (ref 11.5–16.0)
IMMATURE GRANULOCYTES: 1 % — ABNORMAL HIGH (ref 0.0–0.5)
LYMPHOCYTES: 7 % — ABNORMAL LOW (ref 12–49)
MCH: 27.2 PG (ref 26.0–34.0)
MCHC: 31.1 g/dL (ref 30.0–36.5)
MCV: 87.4 FL (ref 80.0–99.0)
MONOCYTES: 8 % (ref 5–13)
MPV: 12.4 FL (ref 8.9–12.9)
NEUTROPHILS: 83 % — ABNORMAL HIGH (ref 32–75)
NRBC: 0 PER 100 WBC
PLATELET: 241 10*3/uL (ref 150–400)
RBC: 4.6 M/uL (ref 3.80–5.20)
RDW: 17.1 % — ABNORMAL HIGH (ref 11.5–14.5)
WBC: 9.8 10*3/uL (ref 3.6–11.0)

## 2016-12-17 LAB — METABOLIC PANEL, COMPREHENSIVE
A-G Ratio: 0.6 — ABNORMAL LOW (ref 1.1–2.2)
ALT (SGPT): 10 U/L — ABNORMAL LOW (ref 12–78)
AST (SGOT): 20 U/L (ref 15–37)
Albumin: 3 g/dL — ABNORMAL LOW (ref 3.5–5.0)
Alk. phosphatase: 56 U/L (ref 45–117)
Anion gap: 9 mmol/L (ref 5–15)
BUN/Creatinine ratio: 19 (ref 12–20)
BUN: 18 MG/DL (ref 6–20)
Bilirubin, total: 1 MG/DL (ref 0.2–1.0)
CO2: 28 mmol/L (ref 21–32)
Calcium: 8.9 MG/DL (ref 8.5–10.1)
Chloride: 105 mmol/L (ref 97–108)
Creatinine: 0.97 MG/DL (ref 0.55–1.02)
GFR est AA: >60 mL/min/{1.73_m2} (ref >60)
GFR est non-AA: 57 mL/min/{1.73_m2} — ABNORMAL LOW (ref >60)
Globulin: 4.8 g/dL — ABNORMAL HIGH (ref 2.0–4.0)
Glucose: 100 mg/dL (ref 65–100)
Potassium: 4 mmol/L (ref 3.5–5.1)
Protein, total: 7.8 g/dL (ref 6.4–8.2)
Sodium: 142 mmol/L (ref 136–145)

## 2016-12-17 MED ORDER — ENOXAPARIN 40 MG/0.4 ML SUB-Q SYRINGE
40 mg/0.4 mL | Freq: Two times a day (BID) | SUBCUTANEOUS | Status: DC
Start: 2016-12-17 — End: 2016-12-22
  Administered 2016-12-17 – 2016-12-22 (×10): via SUBCUTANEOUS

## 2016-12-17 MED ORDER — TIZANIDINE 4 MG TAB
4 mg | Freq: Three times a day (TID) | ORAL | Status: DC | PRN
Start: 2016-12-17 — End: 2016-12-22
  Administered 2016-12-18 – 2016-12-19 (×3): via ORAL

## 2016-12-17 MED ORDER — ASPIRIN 81 MG TAB, DELAYED RELEASE
81 mg | Freq: Every day | ORAL | Status: DC
Start: 2016-12-17 — End: 2016-12-22
  Administered 2016-12-18 – 2016-12-22 (×5): via ORAL

## 2016-12-17 MED ORDER — AMLODIPINE 5 MG TAB
5 mg | Freq: Every day | ORAL | Status: DC
Start: 2016-12-17 — End: 2016-12-22
  Administered 2016-12-18 – 2016-12-22 (×5): via ORAL

## 2016-12-17 MED ORDER — SODIUM CHLORIDE 0.9 % IJ SYRG
Freq: Three times a day (TID) | INTRAMUSCULAR | Status: DC
Start: 2016-12-17 — End: 2016-12-22
  Administered 2016-12-18 – 2016-12-22 (×14): via INTRAVENOUS

## 2016-12-17 MED ORDER — MORPHINE 2 MG/ML INJECTION
2 mg/mL | INTRAMUSCULAR | Status: AC
Start: 2016-12-17 — End: 2016-12-17
  Administered 2016-12-17: 15:00:00 via INTRAVENOUS

## 2016-12-17 MED ORDER — METOPROLOL SUCCINATE SR 50 MG 24 HR TAB
50 mg | Freq: Every day | ORAL | Status: DC
Start: 2016-12-17 — End: 2016-12-22
  Administered 2016-12-18 – 2016-12-22 (×5): via ORAL

## 2016-12-17 MED ORDER — HYDROCODONE-ACETAMINOPHEN 10 MG-325 MG TAB
10-325 mg | ORAL | Status: DC | PRN
Start: 2016-12-17 — End: 2016-12-22
  Administered 2016-12-18 – 2016-12-22 (×11): via ORAL

## 2016-12-17 MED ORDER — PRAVASTATIN 10 MG TAB
10 mg | Freq: Every evening | ORAL | Status: DC
Start: 2016-12-17 — End: 2016-12-22
  Administered 2016-12-18 – 2016-12-22 (×5): via ORAL

## 2016-12-17 MED ORDER — UMECLIDINIUM 62.5 MCG-VILANTEROL 25 MCG/ACTUATION POWDR FOR INHALATION
Freq: Every day | RESPIRATORY_TRACT | Status: DC
Start: 2016-12-17 — End: 2016-12-22
  Administered 2016-12-18 – 2016-12-22 (×5): via RESPIRATORY_TRACT

## 2016-12-17 MED ORDER — MORPHINE 2 MG/ML INJECTION
2 mg/mL | INTRAMUSCULAR | Status: DC | PRN
Start: 2016-12-17 — End: 2016-12-18
  Administered 2016-12-17: via INTRAVENOUS

## 2016-12-17 MED ORDER — SODIUM CHLORIDE 0.9 % IJ SYRG
INTRAMUSCULAR | Status: DC | PRN
Start: 2016-12-17 — End: 2016-12-22

## 2016-12-17 MED ORDER — ACETAMINOPHEN 325 MG TABLET
325 mg | ORAL | Status: DC | PRN
Start: 2016-12-17 — End: 2016-12-22

## 2016-12-17 MED ORDER — IPRATROPIUM-ALBUTEROL 2.5 MG-0.5 MG/3 ML NEB SOLUTION
2.5 mg-0.5 mg/3 ml | Freq: Three times a day (TID) | RESPIRATORY_TRACT | Status: DC
Start: 2016-12-17 — End: 2016-12-18

## 2016-12-17 MED FILL — MORPHINE 2 MG/ML INJECTION: 2 mg/mL | INTRAMUSCULAR | Qty: 2

## 2016-12-17 MED FILL — MORPHINE 2 MG/ML INJECTION: 2 mg/mL | INTRAMUSCULAR | Qty: 1

## 2016-12-17 MED FILL — TIZANIDINE 4 MG TAB: 4 mg | ORAL | Qty: 1

## 2016-12-17 MED FILL — LOVENOX 40 MG/0.4 ML SUBCUTANEOUS SYRINGE: 40 mg/0.4 mL | SUBCUTANEOUS | Qty: 0.4

## 2016-12-17 NOTE — Progress Notes (Signed)
Pharmacy ??? Enoxaparin (Lovenox??) Monitoring/Dosing      Indication: DVT Prophylaxis     Current Dose: Enoxaparin 40 mg subcutaneously every Q24 hours    Creatinine Clearance (mL/min): 52      Labs:  Recent Labs      12/17/16   1035   CREA  0.97   HGB  12.5   PLT  241     Wt Readings from Last 1 Encounters:   12/17/16 142.9 kg (315 lb)     Ht Readings from Last 1 Encounters:   12/17/16 167.6 cm (66")       Impression/Plan:   ?? Enoxaparin adjusted to 40 mg Q12H per anticoagulation protocol for BMI greater than 40.     Thanks,  Delma OfficerMarshall A Pierce, RPH      [email or web address]

## 2016-12-17 NOTE — Progress Notes (Signed)
Pharmacy Clarification of Prior to Admission Medication Regimen     The patient was interviewed regarding clarification of the prior to admission medication regimen and was questioned regarding use of any other inhalers, topical products, over the counter medications, herbal medications, vitamin products or ophthalmic/nasal/otic medication use.     Information Obtained From: Rx Query, patient, and prescription bottles    Pertinent Pharmacy Findings:  ? acetaminophen-codeine (TYLENOL #3) 300-30 mg per tablet: Patient stated she has this agent at home and takes it "as needed."  ? cholecalciferol, VITAMIN D3, (VITAMIN D3) 5,000 unit tab tablet: Patient stated she ran out of this agent "about a month ago" and has not refilled it because it is "too expensive."    PTA medication list was corrected to the following:     Prior to Admission Medications   Prescriptions Last Dose Informant Patient Reported? Taking?   HYDROcodone-acetaminophen (NORCO) 5-325 mg per tablet 12/16/2016 at Unknown time Other No Yes   Sig: Take 1 Tab by mouth every four (4) hours as needed for Pain. Max Daily Amount: 6 Tabs.   acetaminophen-codeine (TYLENOL #3) 300-30 mg per tablet Not Taking at Unknown time Other No No   Sig: Take 1 Tab by mouth nightly as needed for Pain. Max Daily Amount: 1 Tab.   albuterol-ipratropium (DUO-NEB) 2.5 mg-0.5 mg/3 ml nebu 12/16/2016 at Unknown time Other Yes Yes   Sig: Take 3 mL by inhalation three (3) times daily.   amLODIPine (NORVASC) 10 mg tablet 12/16/2016 at Unknown time Other No Yes   Sig: TAKE 1 TABLET BY MOUTH DAILY   aspirin delayed-release 81 mg tablet 12/16/2016 at Unknown time Other Yes Yes   Sig: Take 81 mg by mouth daily.   cholecalciferol, VITAMIN D3, (VITAMIN D3) 5,000 unit tab tablet 11/17/2016 at Unknown time Other No No   Sig: Take 1 Tab by mouth daily.   cloNIDine HCl (CATAPRES) 0.1 mg tablet 12/16/2016 at Unknown time Other No Yes   Sig: TAKE 1 TABLET BY MOUTH ONCE DAILY    furosemide (LASIX) 40 mg tablet 12/16/2016 at Unknown time Other No Yes   Sig: TAKE 1 TABLET BY MOUTH TWICE DAILY FOR SWELLING   isosorbide dinitrate (ISORDIL) 30 mg tablet 12/16/2016 at Unknown time Other No Yes   Sig: TAKE 1 TABLET BY MOUTH THREE TIMES DAILY   lisinopril (PRINIVIL, ZESTRIL) 40 mg tablet 12/16/2016 at Unknown time Other No Yes   Sig: TAKE 1 TABLET BY MOUTH ONCE DAILY   methocarbamol (ROBAXIN) 500 mg tablet 12/16/2016 at Unknown time Other No Yes   Sig: Take 1 Tab by mouth four (4) times daily.   metoprolol succinate (TOPROL-XL) 200 mg XL tablet 12/16/2016 at Unknown time Other No Yes   Sig: TAKE 1 TABLET BY MOUTH ONCE DAILY FOR HIGH BLOOD PRESSURE   naproxen sodium (ALEVE) 220 mg tablet 12/10/2016 at Unknown time Self Yes Yes   Sig: Take 440 mg by mouth every twelve (12) hours as needed for Pain.   pravastatin (PRAVACHOL) 20 mg tablet 12/16/2016 at Unknown time Other No Yes   Sig: TAKE 1 TABLET BY MOUTH ONCE DAILY AT NIGHT   tiZANidine (ZANAFLEX) 4 mg tablet 12/16/2016 at Unknown time Self No Yes   Sig: Take 1 Tab by mouth three (3) times daily as needed. Prn muscle spasm   umeclidinium-vilanterol (ANORO ELLIPTA) 62.5-25 mcg/actuation inhaler 12/16/2016 at Unknown time Other No Yes   Sig: Take 1 Puff by inhalation daily. Indications: BRONCHOSPASM PREVENTION WITH COPD  Facility-Administered Medications: None          Thank you,  Audley Hose, CPhT  Medication History Education administrator

## 2016-12-17 NOTE — ED Notes (Signed)
Patient with large bowel movement on bed pan at this time.  Pt. Placed back in position of comfort with call bell in reach.

## 2016-12-17 NOTE — ED Notes (Signed)
Pt OTF to Xray

## 2016-12-17 NOTE — ED Notes (Signed)
Pt SPO2 at 77%, entered room to assess pt, pt currently on 4L O2 nasally (baseline) changed to 5L, pt encouraged to take deep breaths.  SPO2 returned to 95% O2 move back to 4L (baseline). Pt placed on cardiac monitor.

## 2016-12-17 NOTE — H&P (Addendum)
Hospitalist Admission Note    NAME: Brittney Shelton   DOB:  25-Dec-1950   MRN:  [id number]     Date/Time:  12/17/2016 5:08 PM    Patient PCP: Ermalene Postin, MD  ______________________________________________________________________  Given the patient's current clinical presentation, I have a high level of concern for decompensation if discharged from the emergency department.  Complex decision making was performed, which includes reviewing the patient's available past medical records, laboratory results, and x-ray films.       My assessment of this patient's clinical condition and my plan of care is as follows.    Assessment / Plan:  Intractable back pain  Recently seen in ED, given Rx for zanaflex and norco as well as Rx for back exercises; however presents again today with no relief of symptoms. Has also tried tylenoll #3. 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  Will admit to obs, case mgmnt/social work consulted  c/w norco 10-325 1tab PO q4hrs prn for mod pain    Ambulatory dysfunction  PT eval as above, will need to arrange safe DC, placement for more intensive PT would be ideal    Degenerative disc disease  Pain management above  May benefit from outpatient ortho spine eval pending course    Constipation  Start colace and miralax    Supermorbid obesity  -Counseled on lifestyle modifications  -currently limited by intractable pain, which she states prevents her from engaging PT or other physical activity    COPD with chronic resp failure  Cont home O2 (4L), nebs, does not appear to be in exacerbation  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 On 4L NC O2 continuous at baseline      Subjective:   CHIEF COMPLAINT: intractable back pain    HISTORY OF PRESENT ILLNESS:     Brittney Shelton is a 66 y.o.  African American female who presents from home with intractable back pain, which she states is currently preventing her from getting out of bed. Per patient, she normally is functional at home where she lives alone. However, in the past 1.5-2 weeks she has been able to maintain basic activities of living without assistance. Given her body habitus, she has essentially been unable to even get out of bed at home. She also admits to constipation, stating that it's been over a week since her last bowel movement but denies n/v or abdominal distension.     We were asked to admit for work up and evaluation of the above problems.     Past Medical History:   Diagnosis Date   ??? Chronic obstructive pulmonary disease ([place])    ??? Congestive heart failure ([place])    ??? Diabetes ([place])    ??? Hypertension         History reviewed. No pertinent surgical history.    Social History   Substance Use Topics   ??? Smoking status: Never Smoker   ??? Smokeless tobacco: Never  Used   ??? Alcohol use No        Family History   Problem Relation Age of Onset   ??? Diabetes Mother    ??? Arthritis-osteo Father    ??? COPD Sister    ??? Diabetes Brother    ??? Diabetes Brother      No Known Allergies     Prior to Admission medications    Medication Sig Start Date End Date Taking? Authorizing Provider   aspirin delayed-release 81 mg tablet Take 81 mg by mouth daily.   Yes Historical Provider   HYDROcodone-acetaminophen (NORCO) 5-325 mg per tablet Take 1 Tab by mouth every four (4) hours as needed for Pain. Max Daily Amount: 6 Tabs. 12/13/16  Yes Allen Derry, MD   tiZANidine (ZANAFLEX) 4 mg tablet Take 1 Tab by mouth three (3) times daily as needed. Prn muscle spasm 12/13/16  Yes Allen Derry, MD    methocarbamol (ROBAXIN) 500 mg tablet Take 1 Tab by mouth four (4) times daily. 12/10/16  Yes Ermalene Postin, MD   furosemide (LASIX) 40 mg tablet TAKE 1 TABLET BY MOUTH TWICE DAILY FOR SWELLING 11/12/16  Yes Nau D Domah, MD   metoprolol succinate (TOPROL-XL) 200 mg XL tablet TAKE 1 TABLET BY MOUTH ONCE DAILY FOR HIGH BLOOD PRESSURE 11/12/16  Yes Nau D Domah, MD   amLODIPine (NORVASC) 10 mg tablet TAKE 1 TABLET BY MOUTH DAILY 08/06/16  Yes Nau D Domah, MD   isosorbide dinitrate (ISORDIL) 30 mg tablet TAKE 1 TABLET BY MOUTH THREE TIMES DAILY 05/13/16  Yes Nau D Domah, MD   lisinopril (PRINIVIL, ZESTRIL) 40 mg tablet TAKE 1 TABLET BY MOUTH ONCE DAILY 05/13/16  Yes Nau D Domah, MD   cloNIDine HCl (CATAPRES) 0.1 mg tablet TAKE 1 TABLET BY MOUTH ONCE DAILY 02/16/16  Yes Ermalene Postin, MD   umeclidinium-vilanterol (ANORO ELLIPTA) 62.5-25 mcg/actuation inhaler Take 1 Puff by inhalation daily. Indications: BRONCHOSPASM PREVENTION WITH COPD 01/30/16  Yes Ermalene Postin, MD   naproxen sodium (ALEVE) 220 mg tablet Take 440 mg by mouth every twelve (12) hours as needed for Pain.   Yes Historical Provider   pravastatin (PRAVACHOL) 20 mg tablet TAKE 1 TABLET BY MOUTH ONCE DAILY AT NIGHT 11/26/15  Yes Nau D Domah, MD   albuterol-ipratropium (DUO-NEB) 2.5 mg-0.5 mg/3 ml nebu Take 3 mL by inhalation three (3) times daily.   Yes Historical Provider   acetaminophen-codeine (TYLENOL #3) 300-30 mg per tablet Take 1 Tab by mouth nightly as needed for Pain. Max Daily Amount: 1 Tab. 12/10/16   Ermalene Postin, MD   cholecalciferol, VITAMIN D3, (VITAMIN D3) 5,000 unit tab tablet Take 1 Tab by mouth daily. 12/11/15   Ermalene Postin, MD       REVIEW OF SYSTEMS:     I am not able to complete the review of systems because:   The patient is intubated and sedated    The patient has altered mental status due to his acute medical problems    The patient has baseline aphasia from prior stroke(s)    The patient has baseline dementia and is not reliable historian     The patient is in acute medical distress and unable to provide information           Total of 12 systems reviewed as follows:       POSITIVE= underlined text  Negative = text not underlined  General:  fever, chills, sweats, generalized weakness, weight loss/gain,  loss of appetite   Eyes:    blurred vision, eye pain, loss of vision, double vision  ENT:    rhinorrhea, pharyngitis   Respiratory:   cough, sputum production, SOB, DOE, wheezing, pleuritic pain   Cardiology:   chest pain, palpitations, orthopnea, PND, edema, syncope   Gastrointestinal:  abdominal pain , N/V, diarrhea, dysphagia, constipation, bleeding   Genitourinary:  frequency, urgency, dysuria, hematuria, incontinence   Muskuloskeletal :  arthralgia, myalgia, back pain  Hematology:  easy bruising, nose or gum bleeding, lymphadenopathy   Dermatological: rash, ulceration, pruritis, color change / jaundice  Endocrine:   hot flashes or polydipsia   Neurological:  headache, dizziness, confusion, focal weakness, paresthesia,     Speech difficulties, memory loss, gait difficulty  Psychological: Feelings of anxiety, depression, agitation    Objective:   VITALS:    Visit Vitals   ??? BP 135/51   ??? Pulse 75   ??? Temp 98.5 ??F (36.9 ??C)   ??? Resp 19   ??? Ht '5\' 6"'  (1.676 m)   ??? Wt 142.9 kg (315 lb)   ??? SpO2 92%   ??? BMI 50.84 kg/m2       PHYSICAL EXAM:    General:    Alert, cooperative, moderate distress, appears stated age.     HEENT: Atraumatic, anicteric sclerae, pink conjunctivae     No oral ulcers, mucosa moist, throat clear, dentition fair  Neck:  Supple, symmetrical,  thyroid: non tender  Lungs:   Clear to auscultation bilaterally.  No Wheezing or Rhonchi. No rales.  Chest wall:  No tenderness  No Accessory muscle use.  Heart:   Regular  rhythm,  No  murmur   No edema  Abdomen:   Obese, soft, non-tender. Not distended.  Bowel sounds normal  Extremities: No cyanosis.  No clubbing,      Skin turgor normal, Capillary refill normal, Radial dial pulse 2+   Skin:     Not pale.  Not Jaundiced  No rashes   Psych:  Good insight.  Not depressed.  Not anxious or agitated.  Neurologic: EOMs intact. No facial asymmetry. No aphasia or slurred speech. Symmetrical strength, Sensation grossly intact. Alert and oriented X 4.     _______________________________________________________________________  Care Plan discussed with:    Comments   Patient x    Family  x    RN     Care Manager                    Consultant:      _______________________________________________________________________  Expected  Disposition:   Home with Family    HH/PT/OT/RN x   SNF/LTC x   [facility]    ________________________________________________________________________  TOTAL TIME: 82 Minutes    Critical Care Provided     Minutes non procedure based      Comments    x Reviewed previous records   >50% of visit spent in counseling and coordination of care x Discussion with patient and/or family and questions answered       ________________________________________________________________________  Signed: Trula Slade, DO    Procedures: see electronic medical records for all procedures/Xrays and details which were not copied into this note but were reviewed prior to creation of Plan.    LAB DATA REVIEWED:    Recent Results (from the past 24 hour(s))   CBC WITH AUTOMATED DIFF    Collection Time: 12/17/16 10:35 AM   Result Value Ref Range    WBC 9.8 3.6 -  11.0 K/uL    RBC 4.60 3.80 - 5.20 M/uL    HGB 12.5 11.5 - 16.0 g/dL    HCT 40.2 35.0 - 47.0 %    MCV 87.4 80.0 - 99.0 FL    MCH 27.2 26.0 - 34.0 PG    MCHC 31.1 30.0 - 36.5 g/dL    RDW 17.1 (H) 11.5 - 14.5 %    PLATELET 241 150 - 400 K/uL    MPV 12.4 8.9 - 12.9 FL    NRBC 0.0 0 PER 100 WBC    ABSOLUTE NRBC 0.00 0.00 - 0.01 K/uL    NEUTROPHILS 83 (H) 32 - 75 %    LYMPHOCYTES 7 (L) 12 - 49 %    MONOCYTES 8 5 - 13 %    EOSINOPHILS 1 0 - 7 %    BASOPHILS 0 0 - 1 %    IMMATURE GRANULOCYTES 1 (H) 0.0 - 0.5 %    ABS. NEUTROPHILS 8.1 (H) 1.8 - 8.0 K/UL     ABS. LYMPHOCYTES 0.7 (L) 0.8 - 3.5 K/UL    ABS. MONOCYTES 0.8 0.0 - 1.0 K/UL    ABS. EOSINOPHILS 0.1 0.0 - 0.4 K/UL    ABS. BASOPHILS 0.0 0.0 - 0.1 K/UL    ABS. IMM. GRANS. 0.1 (H) 0.00 - 0.04 K/UL    DF SMEAR SCANNED      RBC COMMENTS ANISOCYTOSIS  1+       METABOLIC PANEL, COMPREHENSIVE    Collection Time: 12/17/16 10:35 AM   Result Value Ref Range    Sodium 142 136 - 145 mmol/L    Potassium 4.0 3.5 - 5.1 mmol/L    Chloride 105 97 - 108 mmol/L    CO2 28 21 - 32 mmol/L    Anion gap 9 5 - 15 mmol/L    Glucose 100 65 - 100 mg/dL    BUN 18 6 - 20 MG/DL    Creatinine 0.97 0.55 - 1.02 MG/DL    BUN/Creatinine ratio 19 12 - 20      GFR est AA >60 >60 ml/min/1.84m    GFR est non-AA 57 (L) >60 ml/min/1.752m   Calcium 8.9 8.5 - 10.1 MG/DL    Bilirubin, total 1.0 0.2 - 1.0 MG/DL    ALT (SGPT) 10 (L) 12 - 78 U/L    AST (SGOT) 20 15 - 37 U/L    Alk. phosphatase 56 45 - 117 U/L    Protein, total 7.8 6.4 - 8.2 g/dL    Albumin 3.0 (L) 3.5 - 5.0 g/dL    Globulin 4.8 (H) 2.0 - 4.0 g/dL    A-G Ratio 0.6 (L) 1.1 - 2.2

## 2016-12-17 NOTE — Progress Notes (Signed)
Reason for Admission:   Back Pain                RRAT Score:     34             Resources/supports as identified by patient/family:   Has retirement income, Brother is helpful, own all necessary DME.                Top Challenges facing patient (as identified by patient/family and CM): Living alone,CHF, COPD and diabetic                       Finances/Medication cost?      Pt has SS income, pt has medicaid supplement assistance.              Transportation?  Pt doesn't drive however her brother is help full with transportation.              Support system or lack thereof?  Brother and county DSS case worker                     Living arrangements?   Living elone in a single story home has 4 steps at entrance             Self-care/ADLs/Cognition?  Pt was independently handling all ADLs and IADLs until severe back pain.            Current Advanced Directive/Advance Care Plan: Not on file                           Plan for utilizing home health:   Yes after SNF rehab                       Likelihood of readmission:  High                 Transition of Care Plan:    SNF     Care Management Interventions  PCP Verified by CM: Yes  Transition of Care Consult (CM Consult): SNF, Discharge Planning  Discharge Durable Medical Equipment:  (pt owns cane,Bipap,inhailer,nebulizer and HO2 via [employer] 4 L via [place].)  Physical Therapy Consult: Yes  Current Support Network: Lives Alone (Single story home has 4 steps at entrance.pt lives alone. pt 's brother is helpfull if a need arise. )  Discharge Location  Discharge Placement: Skilled nursing facility    Glee ArvinPrince Mathew MSW  ED Case Manager   Ext [phone]

## 2016-12-17 NOTE — ED Provider Notes (Signed)
EMERGENCY DEPARTMENT HISTORY AND PHYSICAL EXAM      Date: 12/17/2016  Patient Name: Brittney Shelton    History of Presenting Illness     Chief Complaint   Patient presents with   ??? Back Pain     Patient reports onset of lower back pain on Wednesday with no reflief from prescribed pain medication.    ??? Other     Pt reports that she lives alone and is no longer able to care for herself. Pt reports being unable to ambulate at all and is unable to sit up in bed with out assistance.      History Provided By: Patient    HPI: Brittney Shelton, 66 y.o. female with PMHx significant for COPD, CHF, DM, and HTN, presents via EMS to the ED with cc of gradually worsening back pain for the past 1.5 weeks. Pt informs the back pain has been worsening since onset to currently present with a moderate-high intensity alongside an associated sharp, aching sensation to the bilateral lower back, left greater than right. Pt informs that the discomfort onset without any trauma or injury while she was sitting down. Pt informs that the discomfort has been worsening since onset. Pt reports that the discomfort is increased with movements and ambulating. Pt informs that the discomfort presents with nausea as well. Pt informs her last BM was two weeks ago. Pt states she has been evaluated by her PCP one week prior with prescriptions for Tylenol #3 and Robaxin without alleviation for her discomfort. Pt informs she was evaluated in the ED four days prior as well without significant findings (negative UA, CT spine showing DDD and stenosis) and discharge on Tizanidine and Norco. Pt denies symptomatic relief with medications. Pt lastly informs of mild, intermittent increased work of breathing which is secondary to her discomfort and increased with flares of her discomfort. Pt informs she is on 4L O2 NC at baseline at home. Of note, pt reports she has a history of chronic back pain. Pt informs she  has been unable to leave her bed since the discomfort and has been unable to care for herself without assistance. Pt expresses concern for symptomatic alleviation. Pt specifically denies any fevers, chills, vomiting, diarrhea, urinary complications, abdominal pain, urinary or bowel incontinence, saddle paresthesia, chest pain, or SOB.     Social Hx: - EtOH; - Smoker; - Illicit Drugs    PCP: Ermalene Postin, MD    There are no other complaints, changes, or physical findings at this time.    Current Outpatient Prescriptions   Medication Sig Dispense Refill   ??? aspirin delayed-release 81 mg tablet Take 81 mg by mouth daily.     ??? HYDROcodone-acetaminophen (NORCO) 5-325 mg per tablet Take 1 Tab by mouth every four (4) hours as needed for Pain. Max Daily Amount: 6 Tabs. 15 Tab 0   ??? tiZANidine (ZANAFLEX) 4 mg tablet Take 1 Tab by mouth three (3) times daily as needed. Prn muscle spasm 30 Tab 0   ??? methocarbamol (ROBAXIN) 500 mg tablet Take 1 Tab by mouth four (4) times daily. 20 Tab 1   ??? furosemide (LASIX) 40 mg tablet TAKE 1 TABLET BY MOUTH TWICE DAILY FOR SWELLING 180 Tab 0   ??? metoprolol succinate (TOPROL-XL) 200 mg XL tablet TAKE 1 TABLET BY MOUTH ONCE DAILY FOR HIGH BLOOD PRESSURE 90 Tab 0   ??? amLODIPine (NORVASC) 10 mg tablet TAKE 1 TABLET BY MOUTH DAILY 90 Tab 3   ???  isosorbide dinitrate (ISORDIL) 30 mg tablet TAKE 1 TABLET BY MOUTH THREE TIMES DAILY 270 Tab 3   ??? lisinopril (PRINIVIL, ZESTRIL) 40 mg tablet TAKE 1 TABLET BY MOUTH ONCE DAILY 90 Tab 3   ??? cloNIDine HCl (CATAPRES) 0.1 mg tablet TAKE 1 TABLET BY MOUTH ONCE DAILY 90 Tab 0   ??? umeclidinium-vilanterol (ANORO ELLIPTA) 62.5-25 mcg/actuation inhaler Take 1 Puff by inhalation daily. Indications: BRONCHOSPASM PREVENTION WITH COPD 1 Inhaler 5   ??? naproxen sodium (ALEVE) 220 mg tablet Take 440 mg by mouth every twelve (12) hours as needed for Pain.     ??? pravastatin (PRAVACHOL) 20 mg tablet TAKE 1 TABLET BY MOUTH ONCE DAILY AT NIGHT 90 Tab 5    ??? albuterol-ipratropium (DUO-NEB) 2.5 mg-0.5 mg/3 ml nebu Take 3 mL by inhalation three (3) times daily.     ??? acetaminophen-codeine (TYLENOL #3) 300-30 mg per tablet Take 1 Tab by mouth nightly as needed for Pain. Max Daily Amount: 1 Tab. 30 Tab 0   ??? cholecalciferol, VITAMIN D3, (VITAMIN D3) 5,000 unit tab tablet Take 1 Tab by mouth daily. 30 Tab 5     Past History     Past Medical History:  Past Medical History:   Diagnosis Date   ??? Chronic obstructive pulmonary disease ([place])    ??? Congestive heart failure ([place])    ??? Diabetes ([place])    ??? Hypertension      Past Surgical History:  History reviewed. No pertinent surgical history.  Family History:  Family History   Problem Relation Age of Onset   ??? Diabetes Mother    ??? Arthritis-osteo Father    ??? COPD Sister    ??? Diabetes Brother    ??? Diabetes Brother      Social History:  Social History   Substance Use Topics   ??? Smoking status: Never Smoker   ??? Smokeless tobacco: Never Used   ??? Alcohol use No     Allergies:  No Known Allergies  Review of Systems   Review of Systems   Constitutional: Negative for chills, fatigue and fever.   HENT: Negative for congestion, rhinorrhea and sore throat.    Eyes: Negative for pain, discharge and visual disturbance.   Respiratory: Negative for cough, chest tightness, shortness of breath and wheezing.    Cardiovascular: Negative for chest pain, palpitations and leg swelling.   Gastrointestinal: Positive for nausea. Negative for abdominal pain, constipation, diarrhea and vomiting.        - bowel incontinence    Genitourinary: Negative for dysuria, frequency, hematuria and urgency.        -urinary incontinence    Musculoskeletal: Positive for back pain. Negative for arthralgias and myalgias.        - saddle paresthesia    Skin: Negative for rash.   Neurological: Negative for dizziness, weakness, light-headedness and headaches.   Psychiatric/Behavioral: Negative.    All other systems reviewed and are negative.    Physical Exam   Physical Exam    Constitutional: She is oriented to person, place, and time. She appears well-developed and well-nourished. No distress.   Obese with generalized weakness   HENT:   Head: Normocephalic and atraumatic.   Eyes: EOM are normal. Right eye exhibits no discharge. Left eye exhibits no discharge. No scleral icterus.   Neck: Normal range of motion. Neck supple. No tracheal deviation present.   Cardiovascular: Normal rate, regular rhythm, normal heart sounds and intact distal pulses.  Exam reveals no gallop and no  friction rub.    No murmur heard.  Pulses:       Dorsalis pedis pulses are 2+ on the right side, and 2+ on the left side.   Pulmonary/Chest: Effort normal and breath sounds normal. No respiratory distress. She has no wheezes. She has no rales.   Abdominal: Soft. She exhibits no distension. There is no tenderness.   Musculoskeletal: Normal range of motion.   Diffuse lumbar and paraspinal TTP; no thoracic TTP   Lymphadenopathy:     She has no cervical adenopathy.   Neurological: She is alert and oriented to person, place, and time.   Moving all extremities equally; difficult to assess strength    Skin: Skin is warm and dry. No rash noted.   Psychiatric: She has a normal mood and affect.   Nursing note and vitals reviewed.    Diagnostic Study Results   Labs -     Recent Results (from the past 12 hour(s))   CBC WITH AUTOMATED DIFF    Collection Time: 12/17/16 10:35 AM   Result Value Ref Range    WBC 9.8 3.6 - 11.0 K/uL    RBC 4.60 3.80 - 5.20 M/uL    HGB 12.5 11.5 - 16.0 g/dL    HCT 40.2 35.0 - 47.0 %    MCV 87.4 80.0 - 99.0 FL    MCH 27.2 26.0 - 34.0 PG    MCHC 31.1 30.0 - 36.5 g/dL    RDW 17.1 (H) 11.5 - 14.5 %    PLATELET 241 150 - 400 K/uL    MPV 12.4 8.9 - 12.9 FL    NRBC 0.0 0 PER 100 WBC    ABSOLUTE NRBC 0.00 0.00 - 0.01 K/uL    NEUTROPHILS 83 (H) 32 - 75 %    LYMPHOCYTES 7 (L) 12 - 49 %    MONOCYTES 8 5 - 13 %    EOSINOPHILS 1 0 - 7 %    BASOPHILS 0 0 - 1 %    IMMATURE GRANULOCYTES 1 (H) 0.0 - 0.5 %     ABS. NEUTROPHILS 8.1 (H) 1.8 - 8.0 K/UL    ABS. LYMPHOCYTES 0.7 (L) 0.8 - 3.5 K/UL    ABS. MONOCYTES 0.8 0.0 - 1.0 K/UL    ABS. EOSINOPHILS 0.1 0.0 - 0.4 K/UL    ABS. BASOPHILS 0.0 0.0 - 0.1 K/UL    ABS. IMM. GRANS. 0.1 (H) 0.00 - 0.04 K/UL    DF SMEAR SCANNED      RBC COMMENTS ANISOCYTOSIS  1+       METABOLIC PANEL, COMPREHENSIVE    Collection Time: 12/17/16 10:35 AM   Result Value Ref Range    Sodium 142 136 - 145 mmol/L    Potassium 4.0 3.5 - 5.1 mmol/L    Chloride 105 97 - 108 mmol/L    CO2 28 21 - 32 mmol/L    Anion gap 9 5 - 15 mmol/L    Glucose 100 65 - 100 mg/dL    BUN 18 6 - 20 MG/DL    Creatinine 0.97 0.55 - 1.02 MG/DL    BUN/Creatinine ratio 19 12 - 20      GFR est AA >60 >60 ml/min/1.34m    GFR est non-AA 57 (L) >60 ml/min/1.727m   Calcium 8.9 8.5 - 10.1 MG/DL    Bilirubin, total 1.0 0.2 - 1.0 MG/DL    ALT (SGPT) 10 (L) 12 - 78 U/L    AST (SGOT) 20 15 - 37 U/L  Alk. phosphatase 56 45 - 117 U/L    Protein, total 7.8 6.4 - 8.2 g/dL    Albumin 3.0 (L) 3.5 - 5.0 g/dL    Globulin 4.8 (H) 2.0 - 4.0 g/dL    A-G Ratio 0.6 (L) 1.1 - 2.2       Medical Decision Making   I am the first provider for this patient.    I reviewed the vital signs, available nursing notes, past medical history, past surgical history, family history and social history.    Vital Signs-Reviewed the patient's vital signs.  Patient Vitals for the past 12 hrs:   Temp Pulse Resp BP SpO2   12/17/16 1130 - - - 151/68 95 %   12/17/16 1052 - - - - 94 %   12/17/16 1046 - - - 146/65 95 %   12/17/16 0952 - - - 162/79 93 %   12/17/16 0950 98.5 ??F (36.9 ??C) 89 24 162/79 94 %     Pulse Oximetry Analysis - 99% on 4L O2 (baseline)    Cardiac Monitor:   Rate: 89 bpm  Rhythm: Normal Sinus Rhythm      Records Reviewed: Nursing Notes and Old Medical Records    Provider Notes (Medical Decision Making):   DDx: DDD, MSK, generalized deconditioning, cord compression, cauda equina syndrome      66 y/o F who presents to the ED with acute on chronic back pain. Seen in the ED on 07/14 with CT lumbar spine showing DDD which is likely contributing to current symptoms. Pt obese which is also likely contributing to difficulty ambulating. Afebrile and nontoxic appearing with labs unremarkable. UA on 07/14 negative. PT evaluated pt where she was unable to get off the stretcher. Will admit with plan for short term rehabilitation.     ED Course:   Initial assessment performed. The patients presenting problems have been discussed, and they are in agreement with the care plan formulated and outlined with them.  I have encouraged them to ask questions as they arise throughout their visit.    CONSULT NOTE:   11:50 AM  Ocie Cornfield, MD spoke with Gilmore Laroche  Specialty: PT  Discussed pt's hx, disposition, and available diagnostic and imaging results.  Reviewed care plans.  Consultant agrees with plans as outlined. Will evaluate the pt.  Written by Lianne Moris, ED Scribe, as dictated by Ocie Cornfield, MD.    Progress Note:   12:07 PM  PT Gilmore Laroche informs that herself and a ED Tech attempted to assist the pt to stand and ambulate. Informs that she was unable to get the pt off of the bed and off her back, much less to the edge of the bed. Informs that minor movements elicited pain to the pt. Of note, PT informs that the pt states she lives alone and has her brother help her intermittently for assistance given she is unable to get up or ambulate herself, even to use the restroom.  Written by Lianne Moris, ED Scribe, as dictated by Ocie Cornfield, MD.     CONSULT NOTE:   12:22 PM  Ocie Cornfield, MD spoke with Dr. Marin Comment  Specialty: Hospitalist   Discussed pt's hx, disposition, and available diagnostic and imaging results.  Reviewed care plans.  Consultant agrees with plans as outlined. Advises care management consult prior to admission as well.   Written by Lianne Moris, ED Scribe, as dictated by Ocie Cornfield, MD.    Progress Note:  12:32 PM  Updated pt on all returned results and findings including hospitalist consult and likely admission. Pt in agreement with the further progression of care plan and expresses agreement with and understanding of all items discussed.  Written by Lianne Moris, ED Scribe, as dictated by Ocie Cornfield, MD.     Disposition:  ADMIT NOTE:    12:32 PM  Patient is being admitted to the hospital by Dr. Marin Comment. The results of their tests and reasons for their admission have been discussed with the patient and/or available family. They convey agreement and understanding for the need to be admitted and for the admission diagnosis.     PLAN:  1. Admit to Hospitalist, Dr. Marin Comment    Diagnosis     Clinical Impression:   1. Intractable back pain    2. Unable to ambulate        Attestations:  This note is prepared by Lianne Moris, acting as Scribe for Ocie Cornfield, MD.    Ocie Cornfield, MD: The scribe's documentation has been prepared under my direction and personally reviewed by me in its entirety. I confirm that the note above accurately reflects all work, treatment, procedures, and medical decision making performed by me.    This note will not be viewable in [facility].

## 2016-12-17 NOTE — ED Notes (Signed)
Pt. Continues to rest comfortably in bed at this time with call bell in reach.

## 2016-12-17 NOTE — Progress Notes (Signed)
Primary Nurse Sasha E Klumb, RN and Jesse Carpio, RN performed a dual skin assessment on this patient No impairment noted  Braden score is 16

## 2016-12-17 NOTE — ED Notes (Signed)
Pt. Resting comfortably in bed at this time with call bell in reach. Pt. Updated on plan of care.

## 2016-12-17 NOTE — Progress Notes (Signed)
Physical Therapy    Asked by Dr. Bryan LemmaAshwell to see this pt who comes to the ED with c/o back pain and inability to move. Pt has been medicated for pain. Pt states she lives alone but that her brother comes in intermittently to help and may stay 2-3 nights if she asks him to. She states that she has a hx of back pain but that until ~ 1 week ago, was able to amb with her cane and use her tub for bathing. Attempted to initiate eval. Pt crying in pain with adjust of leg position for removal of purewick. Pt not assisting in movement. Attempted with assist of ED tech to move pt from supine to EOB. Attempted 3x with max A of 2 with various techniques and pt holding breath and crying out each time asking to stop that she could not tolerate the low back pain. Pt on 4L O2 NC. Pt tearful and SOB with attempts. Sats at 90-92% and resp rate in 20s. Unable to test any MMT, ROM due to pt's c/o. Eval aborted at this time as unable to get any accurate assess due to the c/o severe pain and pt wanting to stop due to pain. Pt would be unable to return home at this level of function. Rounded with Dr. Bryan LemmaAshwell. If pt admitted, will benefit from PT in acute setting and probable need for SNF rehab post hospital stay.    Wallene HuhSue Van Doornik, PT  Time: 14 min

## 2016-12-17 NOTE — ED Notes (Signed)
PO provided to patient per Dr. Bryan LemmaAshwell.  Meal tray ordered for patient.

## 2016-12-17 NOTE — Progress Notes (Signed)
TRANSFER - IN REPORT:    Verbal report received from Lauren,RN(name) on Malva LimesMary S Stefan  being received from ED(unit) for routine progression of care      Report consisted of patient???s Situation, Background, Assessment and   Recommendations(SBAR).     Information from the following report(s) SBAR, Kardex, ED Summary, Intake/Output, MAR, Recent Results and Med Rec Status was reviewed with the receiving nurse.    Opportunity for questions and clarification was provided.      Assessment completed upon patient???s arrival to unit and care assumed.

## 2016-12-17 NOTE — ED Triage Notes (Signed)
Pt reports lower back pain starting 4 days ago. Reports no BM for a week. Pt reports feeling sob uses 4L of O2 at home.  Pt currently a/ox4, call bell in reach.

## 2016-12-17 NOTE — ED Notes (Signed)
TRANSFER - OUT REPORT:    Verbal report given to Sasha (name) on Brittney Shelton  being transferred to Ortho (unit) for routine progression of care       Report consisted of patient???s Situation, Background, Assessment and   Recommendations(SBAR).     Information from the following report(s) SBAR, Kardex, ED Summary, [facility] and Recent Results was reviewed with the receiving nurse.    Lines:   Peripheral IV 12/17/16 Left Antecubital (Active)   Site Assessment Clean, dry, & intact 12/17/2016 10:33 AM   Phlebitis Assessment 0 12/17/2016 10:33 AM   Infiltration Assessment 0 12/17/2016 10:33 AM   Dressing Status Clean, dry, & intact 12/17/2016 10:33 AM   Dressing Type Tape;Transparent 12/17/2016 10:33 AM   Hub Color/Line Status Green;Flushed 12/17/2016 10:33 AM   Action Taken Blood drawn 12/17/2016 10:33 AM        Opportunity for questions and clarification was provided.

## 2016-12-18 ENCOUNTER — Inpatient Hospital Stay: Admit: 2016-12-19 | Payer: MEDICARE | Primary: Internal Medicine

## 2016-12-18 ENCOUNTER — Inpatient Hospital Stay: Payer: MEDICARE | Primary: Internal Medicine

## 2016-12-18 LAB — GLUCOSE, POC
Glucose (POC): 103 mg/dL — ABNORMAL HIGH (ref 65–100)
Glucose (POC): 123 mg/dL — ABNORMAL HIGH (ref 65–100)
Glucose (POC): 129 mg/dL — ABNORMAL HIGH (ref 65–100)
Glucose (POC): 128 mg/dL — ABNORMAL HIGH (ref 65–100)

## 2016-12-18 MED ORDER — LIDOCAINE (PF) 10 MG/ML (1 %) IJ SOLN
10 mg/mL (1 %) | Freq: Once | INTRAMUSCULAR | Status: AC
Start: 2016-12-18 — End: 2016-12-19

## 2016-12-18 MED ORDER — POLYETHYLENE GLYCOL 3350 17 GRAM (100 %) ORAL POWDER PACKET
17 gram | Freq: Every day | ORAL | Status: DC
Start: 2016-12-18 — End: 2016-12-22
  Administered 2016-12-18 – 2016-12-22 (×5): via ORAL

## 2016-12-18 MED ORDER — ISOSORBIDE DINITRATE 20 MG TAB
20 mg | Freq: Three times a day (TID) | ORAL | Status: DC
Start: 2016-12-18 — End: 2016-12-22
  Administered 2016-12-18 – 2016-12-22 (×12): via ORAL

## 2016-12-18 MED ORDER — FUROSEMIDE 40 MG TAB
40 mg | Freq: Two times a day (BID) | ORAL | Status: DC
Start: 2016-12-18 — End: 2016-12-22
  Administered 2016-12-18 – 2016-12-22 (×8): via ORAL

## 2016-12-18 MED ORDER — LISINOPRIL 20 MG TAB
20 mg | Freq: Every day | ORAL | Status: DC
Start: 2016-12-18 — End: 2016-12-22
  Administered 2016-12-18 – 2016-12-22 (×5): via ORAL

## 2016-12-18 MED ORDER — METHYLPREDNISOLONE 40 MG/ML SUSP FOR INJECTION
40 mg/mL | Freq: Once | INTRAMUSCULAR | Status: AC
Start: 2016-12-18 — End: 2016-12-19

## 2016-12-18 MED ORDER — IPRATROPIUM-ALBUTEROL 2.5 MG-0.5 MG/3 ML NEB SOLUTION
2.5 mg-0.5 mg/3 ml | Freq: Three times a day (TID) | RESPIRATORY_TRACT | Status: DC
Start: 2016-12-18 — End: 2016-12-22
  Administered 2016-12-18 – 2016-12-22 (×12): via RESPIRATORY_TRACT

## 2016-12-18 MED ORDER — CLONIDINE 0.1 MG TAB
0.1 mg | Freq: Every day | ORAL | Status: DC
Start: 2016-12-18 — End: 2016-12-22
  Administered 2016-12-19 – 2016-12-22 (×4): via ORAL

## 2016-12-18 MED ORDER — FENTANYL CITRATE (PF) 50 MCG/ML IJ SOLN
50 mcg/mL | Freq: Four times a day (QID) | INTRAMUSCULAR | Status: DC | PRN
Start: 2016-12-18 — End: 2016-12-22
  Administered 2016-12-19: 04:00:00 via INTRAVENOUS

## 2016-12-18 MED ORDER — DOCUSATE SODIUM 100 MG CAP
100 mg | Freq: Every day | ORAL | Status: DC | PRN
Start: 2016-12-18 — End: 2016-12-22

## 2016-12-18 MED FILL — BD POSIFLUSH NORMAL SALINE 0.9 % INJECTION SYRINGE: INTRAMUSCULAR | Qty: 10

## 2016-12-18 MED FILL — LOVENOX 40 MG/0.4 ML SUBCUTANEOUS SYRINGE: 40 mg/0.4 mL | SUBCUTANEOUS | Qty: 0.4

## 2016-12-18 MED FILL — HYDROCODONE-ACETAMINOPHEN 10 MG-325 MG TAB: 10-325 mg | ORAL | Qty: 1

## 2016-12-18 MED FILL — ASPIRIN 81 MG TAB, DELAYED RELEASE: 81 mg | ORAL | Qty: 1

## 2016-12-18 MED FILL — ISOSORBIDE DINITRATE 20 MG TAB: 20 mg | ORAL | Qty: 1

## 2016-12-18 MED FILL — FUROSEMIDE 40 MG TAB: 40 mg | ORAL | Qty: 1

## 2016-12-18 MED FILL — TIZANIDINE 4 MG TAB: 4 mg | ORAL | Qty: 1

## 2016-12-18 MED FILL — ANORO ELLIPTA 62.5 MCG-25 MCG/ACTUATION POWDER FOR INHALATION: RESPIRATORY_TRACT | Qty: 14

## 2016-12-18 MED FILL — TOPROL XL 50 MG TABLET,EXTENDED RELEASE: 50 mg | ORAL | Qty: 4

## 2016-12-18 MED FILL — LISINOPRIL 20 MG TAB: 20 mg | ORAL | Qty: 2

## 2016-12-18 MED FILL — AMLODIPINE 5 MG TAB: 5 mg | ORAL | Qty: 2

## 2016-12-18 MED FILL — PRAVASTATIN 10 MG TAB: 10 mg | ORAL | Qty: 2

## 2016-12-18 MED FILL — IPRATROPIUM-ALBUTEROL 2.5 MG-0.5 MG/3 ML NEB SOLUTION: 2.5 mg-0.5 mg/3 ml | RESPIRATORY_TRACT | Qty: 3

## 2016-12-18 MED FILL — HEALTHYLAX 17 GRAM ORAL POWDER PACKET: 17 gram | ORAL | Qty: 1

## 2016-12-18 MED FILL — LIDOCAINE (PF) 10 MG/ML (1 %) IJ SOLN: 10 mg/mL (1 %) | INTRAMUSCULAR | Qty: 10

## 2016-12-18 MED FILL — DEPO-MEDROL 40 MG/ML SUSPENSION FOR INJECTION: 40 mg/mL | INTRAMUSCULAR | Qty: 1

## 2016-12-18 NOTE — Progress Notes (Signed)
Orders received, chart reviewed and patient evaluated by physical therapy. Full evaluation to follow.     Brandon K Smith, PT, DPT   Board-Certified Geriatric Clinical Specialist   Certified Exercise Expert for Aging Adults

## 2016-12-18 NOTE — Progress Notes (Addendum)
Physical Therapy Goals  Initiated 12/18/2016   1. Patient will move from supine to sit and sit to supine in bed with moderate assistance within 7 day(s).   2. Patient will transfer from bed to chair and chair to bed with maximal assistance using the least restrictive device within 7 day(s).  3. Patient will perform sit to stand with maximal assistance within 7 day(s).  4. Patient will ambulate with moderate assistance for 15 feet with the least restrictive device within 7 day(s).     physical Therapy EVALUATION  Patient: Brittney Shelton [id number] y.o. female)  Date: 12/18/2016  Primary Diagnosis: Intractable back pain            ASSESSMENT :  Based on the objective data described below, the patient presents with grossly decreased functional mobility secondary to back and LLE pain. Patient received sitting on EOB attempting to move to bedside commode with RN and tech. Patient presented with R lateral lean onto bed rail and was unable to sit up or scoot forward without max a x 2. It was determined that move to bedside commode was not feasible at this time and patient was moved to supine with max a x 3. Total a x 4 was required to move patient to head of bed with bed in trendelenburg position. Rolling was commenced to change sheets and move patient onto bed pan. Patient was able to assist with rolling, but max a x 2 was still required. Rolling to patient's left side required less assistance because the patient was able to assist roll with RLE. Patient complained of severe LLE pain radiating from L knee distally to foot. No upper LLE pain reported. Patient noticeably DOE with mobility on 4 l/min through nasal cannula.    Recommend D/C to SNF due to patients inability to tolerate prolonged therapy.    Patient will benefit from skilled intervention to address the above impairments.  Patient???s rehabilitation potential is considered to be Fair  Factors which may influence rehabilitation potential include:   []          None noted   []          Mental ability/status  [x]          Medical condition  []          Home/family situation and support systems  []          Safety awareness  [x]          Pain tolerance/management  []          Other:      PLAN :  Recommendations and Planned Interventions:  [x]            Bed Mobility Training             []     Neuromuscular Re-Education  [x]            Transfer Training                   []     Orthotic/Prosthetic Training  [x]            Gait Training                         []     Modalities  [x]            Therapeutic Exercises           []     Edema Management/Control  [x]   Therapeutic Activities            [x]     Patient and Family Training/Education  []            Other (comment):    Frequency/Duration: Patient will be followed by physical therapy  5 times a week to address goals.  Discharge Recommendations: Skilled Nursing Facility  Further Equipment Recommendations for Discharge: None     SUBJECTIVE:   Patient stated ???Be careful with my left leg.???    OBJECTIVE DATA SUMMARY:   HISTORY:    Past Medical History:   Diagnosis Date   ??? Chronic obstructive pulmonary disease (HCC)    ??? Congestive heart failure (HCC)    ??? Diabetes (HCC)    ??? Hypertension    History reviewed. No pertinent surgical history.  Prior Level of Function/Home Situation: Patient lives at home and reported that he nephew helps her with ADLs and mobility. Patient previously ambulated with [facility] but has seen recent decline in mobility and reported that her nephew has had to help her move so he can put a depends under her for BM. Patient is on 4 l/min at home.  Personal factors and/or comorbidities impacting plan of care: Morbid obesity, HCF, COPD    Home Situation  Home Environment: Private residence  # Steps to Enter: 3  Rails to Enter: Yes  One/Two Story Residence: One story  Living Alone: Yes  Support Systems: Family member(s)  Patient Expects to be Discharged to:: Unknown   Current DME Used/Available at Home: Oxygen, portable, Cane, straight, Walker    EXAMINATION/PRESENTATION/DECISION MAKING:   Critical Behavior:  Neurologic State: Alert  Orientation Level: Oriented X4  Cognition: Follows commands     Hearing:  Auditory  Auditory Impairment: None    Range Of Motion:  AROM: Generally decreased, functional         Strength:    Strength: Generally decreased, functional         Coordination:  Coordination: Within functional limits  Functional Mobility:  Bed Mobility:  Rolling: Maximum assistance;Assist x2     Sit to Supine: Maximum assistance;Assist x2      Balance:   Sitting: Impaired  Sitting - Static: Poor (constant support)  Sitting - Dynamic: None    Functional Measure:  Barthel Index:    Bathing: 0  Bladder: 10  Bowels: 10  Grooming: 0  Dressing: 0  Feeding: 10  Mobility: 0  Stairs: 0  Toilet Use: 0  Transfer (Bed to Chair and Back): 0  Total: 30       Barthel and G-code impairment scale:  Percentage of impairment CH  0% CI  1-19% CJ  20-39% CK  40-59% CL  60-79% CM  80-99% CN  100%   Barthel Score 0-100 100 99-80 79-60 59-40 20-39 1-19   0   Barthel Score 0-20 20 17-19 13-16 9-12 5-8 1-4 0      The Barthel ADL Index: Guidelines  1. The index should be used as a record of what a patient does, not as a record of what a patient could do.  2. The main aim is to establish degree of independence from any help, physical or verbal, however minor and for whatever reason.  3. The need for supervision renders the patient not independent.  4. A patient's performance should be established using the best available evidence. Asking the patient, friends/relatives and nurses are the usual sources, but direct observation and common sense are also important. However direct testing is  not needed.  5. Usually the patient's performance over the preceding 24-48 hours is important, but occasionally longer periods will be relevant.  6. Middle categories imply that the patient supplies over 50 per cent of  the effort.  7. Use of aids to be independent is allowed.    Clarisa KindredMahoney, F.l., Barthel, D.W. [id number](1965). Functional evaluation: the Barthel Index. Md State Med J (14)2.  Zenaida NieceVan der SkylandPutten, J.J.M.F, [place], Ian MalkinJ.C., Margret ChanceFreeman, J.A., [place], [place].J. (1999). Measuring the change indisability after inpatient rehabilitation; comparison of the responsiveness of the Barthel Index and Functional Independence Measure. Journal of Neurology, Neurosurgery, and Psychiatry, 66(4), 240 212 [id number].  Dawson BillsVan Exel, N.J.A, [place],  W.J.M, & Koopmanschap, M.A. (2004.) Assessment of post-stroke quality of life in cost-effectiveness studies: The usefulness of the Barthel Index and the EuroQoL-5D. Quality of Life Research, 13, [phone]         G codes:  In compliance with CMS???s Claims Based Outcome Reporting, the following G-code set was chosen for this patient based on their primary functional limitation being treated:    The outcome measure chosen to determine the severity of the functional limitation was the Barthel with a score of 30/100 which was correlated with the impairment scale.    ? Mobility - Walking and Moving Around:    [phone] - CURRENT STATUS: CL - 60%-79% impaired, limited or restricted   [id number] - GOAL STATUS: CK - 40%-59% impaired, limited or restricted   [id number] - D/C STATUS:  ---------------To be determined---------------       Pain:  Pain Scale 1: Numeric (0 - 10)  Pain Intensity 1: 3  Pain Location 1: Back;Leg  Pain Orientation 1: Lower;Left  Pain Description 1: Aching  Pain Intervention(s) 1: Medication (see MAR)  Activity Tolerance:   Patient unable to tolerate activity due to severe leg pain.    Please refer to the flowsheet for vital signs taken during this treatment.  After treatment:   []          Patient left in no apparent distress sitting up in chair  [x]          Patient left in no apparent distress in bed  [x]          Call bell left within reach  [x]          Nursing notified  []          Caregiver present   []          Bed alarm activated    COMMUNICATION/EDUCATION:   The patient???s plan of care was discussed with: Registered Nurse.  [x]          Fall prevention education was provided and the patient/caregiver indicated understanding.  [x]          Patient/family have participated as able in goal setting and plan of care.  [x]          Patient/family agree to work toward stated goals and plan of care.  []          Patient understands intent and goals of therapy, but is neutral about his/her participation.  []          Patient is unable to participate in goal setting and plan of care.    Thank you for this referral.  Norville Haggardavid W Pumphrey, SPT    Time Calculation: 12 mins    Regarding student involvement in patient care:  A student participated in this treatment session. Per CMS Medicare statements and APTA guidelines I certify that the following was true:  1.  I was present and directly observed the entire session.  2. I made all skilled judgments and clinical decisions regarding care.  3. I am the practitioner responsible for assessment, treatment, and documentation.

## 2016-12-18 NOTE — Progress Notes (Signed)
CM contacted UR RN to advise pt requires SNF as recommended by therapy and will need inpatient status for placement. CM left contact information and was advised by UR they would review with UR physician.    Care Management Interventions  PCP Verified by CM: Yes  Mode of Transport at Discharge: ALS Teaching laboratory technician(Transportation to SNF)  Transition of Care Consult (CM Consult): SNF  Partner SNF: Yes  MyChart Signup: No  Discharge Durable Medical Equipment: No  Physical Therapy Consult: Yes  Occupational Therapy Consult: No  Speech Therapy Consult: No  Current Support Network: Lives Alone  Confirm Follow Up Transport: Family  Plan discussed with Pt/Family/Caregiver: Yes  Freedom of Choice Offered: Yes  Discharge Location  Discharge Placement: Skilled nursing facility    PatokaShikita Shelton, MSW, CM  [facility] Care Manager   [phone]

## 2016-12-18 NOTE — Consults (Signed)
ORTHOPAEDIC CONSULT NOTE    Subjective:     Date of Consultation:  December 18, 2016      Brittney Shelton is a 66 y.o. female who is being seen for L knee pain. Pt has been admitted for back pain and inability to ambulate. Pt reports during admission she began to have L knee pain that is so bad she can not walk. l knee pain increase with movement as well as back pain. Pt states she has a hx of OA but has never seen an Ortho MD in the past. Denies fall or injury, numbness/tingling. radiation of back pain at times but stops before the knee.     Patient Active Problem List    Diagnosis Date Noted   ??? Ambulatory dysfunction 12/18/2016   ??? Intractable back pain 12/17/2016   ??? 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 Obesity, morbid (HCC) 05/21/2016   ??? Acute on chronic diastolic (congestive) heart failure (HCC) 01/23/2016   ??? Obesity 07/27/2015   ??? Requires continuous at home supplemental oxygen 07/27/2015   ??? Dependence on supplemental oxygen 07/27/2015   ??? ACP (advance care planning) 07/27/2015   ??? Counseling regarding advanced care planning and goals of care 07/27/2015   ??? Dyspnea 07/27/2015   ??? COPD (chronic obstructive pulmonary disease) (HCC) 07/26/2015   ??? Well controlled type 2 diabetes mellitus (HCC) 09/29/2014   ??? Hypercholesterolemia 09/29/2014   ??? 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 Chronic acquired lymphedema 09/29/2014     Family History   Problem Relation Age of Onset   ??? Diabetes Mother    ??? Arthritis-osteo Father    ??? COPD Sister    ??? Diabetes Brother    ??? Diabetes Brother       Social History   Substance Use Topics   ??? Smoking status: Never Smoker   ??? Smokeless tobacco: Never Used   ??? Alcohol use No     Past Medical History:   Diagnosis Date   ??? Chronic obstructive pulmonary disease (HCC)    ??? Congestive heart failure (HCC)    ??? Diabetes (HCC)    ??? Hypertension       History reviewed. No pertinent surgical history.   Prior to Admission medications     Medication Sig Start Date End Date Taking? Authorizing Provider   aspirin delayed-release 81 mg tablet Take 81 mg by mouth daily.   Yes Historical Provider   HYDROcodone-acetaminophen (NORCO) 5-325 mg per tablet Take 1 Tab by mouth every four (4) hours as needed for Pain. Max Daily Amount: 6 Tabs. 12/13/16  Yes Delena Baliurt Heimbach, MD   tiZANidine (ZANAFLEX) 4 mg tablet Take 1 Tab by mouth three (3) times daily as needed. Prn muscle spasm 12/13/16  Yes Delena Baliurt Heimbach, MD   methocarbamol (ROBAXIN) 500 mg tablet Take 1 Tab by mouth four (4) times daily. 12/10/16  Yes Erenest RasherNau D Domah, MD   furosemide (LASIX) 40 mg tablet TAKE 1 TABLET BY MOUTH TWICE DAILY FOR SWELLING 11/12/16  Yes Nau D Domah, MD   metoprolol succinate (TOPROL-XL) 200 mg XL tablet TAKE 1 TABLET BY MOUTH ONCE DAILY FOR HIGH BLOOD PRESSURE 11/12/16  Yes Nau D Domah, MD   amLODIPine (NORVASC) 10 mg tablet TAKE 1 TABLET BY MOUTH DAILY 08/06/16  Yes Erenest RasherNau D Domah, MD   isosorbide dinitrate (ISORDIL) 30 mg tablet TAKE 1 TABLET BY MOUTH THREE TIMES DAILY 05/13/16  Yes Erenest RasherNau D Domah, MD  lisinopril (PRINIVIL, ZESTRIL) 40 mg tablet TAKE 1 TABLET BY MOUTH ONCE DAILY 05/13/16  Yes Nau D Domah, MD   cloNIDine HCl (CATAPRES) 0.1 mg tablet TAKE 1 TABLET BY MOUTH ONCE DAILY 02/16/16  Yes Erenest Rasher, MD   umeclidinium-vilanterol (ANORO ELLIPTA) 62.5-25 mcg/actuation inhaler Take 1 Puff by inhalation daily. Indications: BRONCHOSPASM PREVENTION WITH COPD 01/30/16  Yes Erenest Rasher, MD   naproxen sodium (ALEVE) 220 mg tablet Take 440 mg by mouth every twelve (12) hours as needed for Pain.   Yes Historical Provider   pravastatin (PRAVACHOL) 20 mg tablet TAKE 1 TABLET BY MOUTH ONCE DAILY AT NIGHT 11/26/15  Yes Nau D Domah, MD   albuterol-ipratropium (DUO-NEB) 2.5 mg-0.5 mg/3 ml nebu Take 3 mL by inhalation three (3) times daily.   Yes Historical Provider   acetaminophen-codeine (TYLENOL #3) 300-30 mg per tablet Take 1 Tab by  mouth nightly as needed for Pain. Max Daily Amount: 1 Tab. 12/10/16   Erenest Rasher, MD   cholecalciferol, VITAMIN D3, (VITAMIN D3) 5,000 unit tab tablet Take 1 Tab by mouth daily. 12/11/15   Erenest Rasher, MD     Current Facility-Administered Medications   Medication Dose Route Frequency   ??? polyethylene glycol (MIRALAX) packet 17 g  17 g Oral DAILY   ??? docusate sodium (COLACE) capsule 100 mg  100 mg Oral DAILY PRN   ??? albuterol-ipratropium (DUO-NEB) 2.5 MG-0.5 MG/3 ML  3 mL Nebulization TID RT   ??? furosemide (LASIX) tablet 40 mg  40 mg Oral BID   ??? fentaNYL citrate (PF) injection 25 mcg  25 mcg IntraVENous Q6H PRN   ??? lisinopril (PRINIVIL, ZESTRIL) tablet 40 mg  40 mg Oral DAILY   ??? isosorbide dinitrate (ISORDIL) tablet 30 mg  30 mg Oral TID   ??? [START ON 12/19/2016] cloNIDine HCl (CATAPRES) tablet 0.1 mg  0.1 mg Oral DAILY   ??? aspirin delayed-release tablet 81 mg  81 mg Oral DAILY   ??? tiZANidine (ZANAFLEX) tablet 4 mg  4 mg Oral TID PRN   ??? metoprolol succinate (TOPROL-XL) XL tablet 200 mg  200 mg Oral DAILY   ??? amLODIPine (NORVASC) tablet 10 mg  10 mg Oral DAILY   ??? umeclidinium-vilanterol (ANORO ELLIPTA) 62.5 mcg- 25 mcg/inhalation  1 Puff Inhalation DAILY   ??? pravastatin (PRAVACHOL) tablet 20 mg  20 mg Oral QHS   ??? sodium chloride (NS) flush 5-10 mL  5-10 mL IntraVENous Q8H   ??? sodium chloride (NS) flush 5-10 mL  5-10 mL IntraVENous PRN   ??? acetaminophen (TYLENOL) tablet 650 mg  650 mg Oral Q4H PRN   ??? HYDROcodone-acetaminophen (NORCO) 10-325 mg tablet 1 Tab  1 Tab Oral Q4H PRN   ??? enoxaparin (LOVENOX) injection 40 mg  40 mg SubCUTAneous Q12H      No Known Allergies     Review of Systems:  A comprehensive review of systems was negative except for that written in the HPI.    Mental Status: no dementia    Objective:     Patient Vitals for the past 8 hrs:   BP Temp Pulse Resp SpO2   12/18/16 1546 (!) 148/92 98.7 ??F (37.1 ??C) 82 18 93 %   12/18/16 1323 - - - - 90 %   12/18/16 1146 - - - - 91 %    12/18/16 1137 - - - - (!) 78 %   12/18/16 1133 193/90 97.6 ??F (36.4 ??C) 83 18 (!) 80 %  Temp (24hrs), Avg:98.3 ??F (36.8 ??C), Min:97.6 ??F (36.4 ??C), Max:98.7 ??F (37.1 ??C)      Gen: Well-developed,  in no acute distress   Musc: +ROM of UE with limited ROM of bilat LE due to size and pain, +TTP of medial L knee no notable edema when compared to R knee, sensation intact   Skin: No skin breakdown noted. Skin warm, pink, dry  Neuro: Cranial nerves are grossly intact, no focal motor weakness, follows commands appropriately   Psych: Good insight, oriented to person, place and time, alert    Imaging Review: none     Labs:   Recent Results (from the past 24 hour(s))   GLUCOSE, POC    Collection Time: 12/17/16 10:22 PM   Result Value Ref Range    Glucose (POC) 103 (H) 65 - 100 mg/dL    Performed by Klumb Sasha    GLUCOSE, POC    Collection Time: 12/18/16  7:40 AM   Result Value Ref Range    Glucose (POC) 123 (H) 65 - 100 mg/dL    Performed by Jacqlyn Larsen    GLUCOSE, POC    Collection Time: 12/18/16 10:51 AM   Result Value Ref Range    Glucose (POC) 129 (H) 65 - 100 mg/dL    Performed by Jacqlyn Larsen    GLUCOSE, POC    Collection Time: 12/18/16  3:38 PM   Result Value Ref Range    Glucose (POC) 128 (H) 65 - 100 mg/dL    Performed by Cristela Blue          Impression:     Patient Active Problem List    Diagnosis Date Noted   ??? Ambulatory dysfunction 12/18/2016   ??? Intractable back pain 12/17/2016   ??? 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 Obesity, morbid (HCC) 05/21/2016   ??? Acute on chronic diastolic (congestive) heart failure (HCC) 01/23/2016   ??? Obesity 07/27/2015   ??? Requires continuous at home supplemental oxygen 07/27/2015   ??? Dependence on supplemental oxygen 07/27/2015   ??? ACP (advance care planning) 07/27/2015   ??? Counseling regarding advanced care planning and goals of care 07/27/2015   ??? Dyspnea 07/27/2015   ??? COPD (chronic obstructive pulmonary disease) (HCC) 07/26/2015    ??? Well controlled type 2 diabetes mellitus (HCC) 09/29/2014   ??? Hypercholesterolemia 09/29/2014   ??? 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 Chronic acquired lymphedema 09/29/2014     Active Problems:    Intractable back pain (12/17/2016)      Ambulatory dysfunction (12/18/2016)        Plan:   -  Pt is stable orthopaedically  -  Knee XR pending   -  Continue current PO meds for DDD   -  Will follow up in AM after XR    Dr. Janann August aware and agrees with plan as above.        Fanny Skates, NP  Orthopedic Nurse Practitioner   [facility]

## 2016-12-18 NOTE — Progress Notes (Signed)
Attempted to see pt for therapy services.  Pt reported 9/10 pain in LE Left greater than right.  She has been unable to attempt transfers early this morning due to pain. Will defer and continue to follow.

## 2016-12-18 NOTE — Progress Notes (Signed)
Pt is unable to stand for xray of left knee.  Contacted ordering provider.  New order given for portable xray.  Pt is made aware of change.  Radiology has been called and notified of change so that xr can be done.

## 2016-12-18 NOTE — Progress Notes (Signed)
ADULT PROTOCOL: JET AEROSOL ASSESSMENT    Patient  Brittney Shelton     66 y.o.   female     12/18/2016  7:12 AM    Breath Sounds Pre Procedure: Right Breath Sounds: Diminished                               Left Breath Sounds: Diminished    Breath Sounds Post Procedure: Right Breath Sounds: Diminished                                 Left Breath Sounds: Diminished      Heart Rate: Pre procedure Pulse: 88           Post procedure Pulse: 89    Resp Rate: Pre procedure Respirations: 20           Post procedure Respirations: 20      Oxygen: O2 Device: Nasal cannula         SpO2: Pre procedure SpO2: 90 %               Post procedure SpO2: 91 %    Nebulizer Therapy: Current medications Aerosolized Medications: DuoNeb     Problem List:   Patient Active Problem List   Diagnosis Code   ??? Well controlled type 2 diabetes mellitus (HCC) E11.9   ??? Hypercholesterolemia E78.00   ??? 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 COPD (chronic obstructive pulmonary disease) (HCC) J44.9   ??? Obesity E66.9   ??? Requires continuous at home supplemental oxygen Z99.81   ??? Dependence on supplemental oxygen Z99.81   ??? ACP (advance care planning) Z71.89   ??? Counseling regarding advanced care planning and goals of care Z71.89   ??? Dyspnea R06.00   ??? Acute on chronic diastolic (congestive) heart failure (HCC) I50.33   ??? Obesity, morbid (HCC) E66.01   ??? 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 Intractable back pain M54.9       Respiratory Therapist: Lonell FaceMelissa J Gray, RT

## 2016-12-18 NOTE — Other (Signed)
Bedside interdisciplinary rounds were held today to discuss patient plan of care and outcomes. The following members were present: Physician, Nurse, Clinical Care Leader, Pharmacy, Physical Therapy, and Case Management.    Plan:  PT recommends SNF - needs 3 midnights as inpatient.  OT consulted today.  Lovenox.

## 2016-12-18 NOTE — Progress Notes (Signed)
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  Recently seen in ED, given Rx for zanaflex and norco as well as Rx for back exercises; however presented again 7/18 with no relief of symptoms. Has also tried tylenoll #3. 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  Will admit to obs, case mgmnt/social work consulted  Will consult ortho  c/w norco 10-325 1tab PO q4hrs prn for mod pain  Fentanyl iv prn  ??  Ambulatory dysfunction  PT eval as above, will need to arrange safe DC, placement for more intensive PT would be ideal  ??  Degenerative disc disease  Patient reporting pain radiating to left leg -?sciatica  Ortho consulted    Hypoxia:due to copd  Duo-neb,on oxygen.  ??  Constipation  Start colace and miralax  ??  Supermorbid obesity  -Counseled on lifestyle modifications  -currently limited by intractable pain, which she states prevents her from engaging PT or other physical activity  ??  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  Hx of chronic LE edema  PRN lasix at home  Hold for now, monitor and start if LE swelling emerges  ??  Code Status: Full  Surrogate Decision Maker: Husband  DVT Prophylaxis: SQ lovenox  GI Prophylaxis: not indicated  ??  Baseline: lives at home by herself, currently unable to get of of bed without and unable to participate in PT while in ED. On 4L NC O2 continuous at baseline    Disposition:rehab/SNF     Body mass index is 50.84 kg/(m^2).     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 Discussed with RN events overnight.Pt still complaining of a lot of pain,comfined in bed.    Review of Systems:  Symptom Y/N Comments  Symptom Y/N Comments   Fever/Chills n   Chest Pain     Poor Appetite n   Edema     Cough    Abdominal Pain     Sputum    Joint Pain y Back pain,knee pain   SOB/DOE    Pruritis/Rash     Nausea/vomit    Tolerating PT/OT     Diarrhea    Tolerating Diet     Constipation y   Other       Could NOT obtain due to:      Objective:     VITALS:   Last 24hrs VS reviewed since prior progress note. Most recent are:  Patient Vitals for the past 24 hrs:   Temp Pulse Resp BP SpO2   12/18/16 1323 - - - - 90 %   12/18/16 1146 - - - - 91 %   12/18/16 1137 - - - - (!) 78 %   12/18/16 1133 97.6 ??F (36.4 ??C) 83 18 193/90 (!) 80 %   12/18/16 0800 98.5 ??F (36.9 ??C) 86 18 140/62 91 %  12/18/16 0707 - - - - 90 %   12/18/16 0522 98.2 ??F (36.8 ??C) 85 18 153/73 90 %   12/18/16 0001 98.6 ??F (37 ??C) 93 18 155/72 (!) 89 %   12/17/16 1940 98.1 ??F (36.7 ??C) 90 18 167/77 92 %   12/17/16 1830 - 95 19 165/53 (!) 89 %   12/17/16 1806 - 86 28 133/70 (!) 88 %   12/17/16 1730 - 96 22 149/64 90 %   12/17/16 1700 - 85 20 155/66 (!) 87 %   12/17/16 1545 - 75 19 135/51 92 %   12/17/16 1530 - 76 14 139/53 91 %   12/17/16 1501 - 80 18 146/59 95 %       Intake/Output Summary (Last 24 hours) at 12/18/16 1343  Last data filed at 12/17/16 2230   Gross per 24 hour   Intake                0 ml   Output              350 ml   Net             -350 ml        PHYSICAL EXAM:  General: WD, WN. Alert, cooperative, no acute distress????  EENT:  EOMI. Anicteric sclerae. MMM  Resp:  CTA bilaterally, no wheezing or rales.  No accessory muscle use  CV:  Regular  rhythm,?? No edema  GI:  Soft, Non distended, Non tender. ??+Bowel sounds  Neurologic:?? Alert and oriented X 3, normal speech,   Psych:???? Good insight.??Not anxious nor agitated  Skin:  No rashes.  No jaundice     Reviewed most current lab test results and cultures  YES  Reviewed most current radiology test results   YES  Review and summation of old records today    NO  Reviewed patient's current orders and MAR    YES  PMH/SH reviewed - no change compared to H&P  ________________________________________________________________________  Care Plan discussed with:    Comments   Patient x    Family      RN x    Care Manager x    Consultant                        Multidiciplinary team rounds were held today with case manager, nursing, pharmacist and Higher education careers adviser.  Patient's plan of care was discussed; medications were reviewed and discharge planning was addressed.     ________________________________________________________________________  Total NON critical care TIME: 35   Minutes    Total CRITICAL CARE TIME Spent:   Minutes non procedure based      Comments   >50% of visit spent in counseling and coordination of care x    ________________________________________________________________________  Darnelle Catalan, MD     Procedures: see electronic medical records for all procedures/Xrays and details which were not copied into this note but were reviewed prior to creation of Plan.      LABS:  I reviewed today's most current labs and imaging studies.  Pertinent labs include:  Recent Labs      12/17/16   1035   WBC  9.8   HGB  12.5   HCT  40.2   PLT  241     Recent Labs      12/17/16   1035   NA  142   K  4.0   CL  105   CO2  28   GLU  100   BUN  18   CREA  0.97   CA  8.9   ALB  3.0*   TBILI  1.0   SGOT  20   ALT  10*       Signed: Darnelle Catalan, MD

## 2016-12-18 NOTE — Progress Notes (Signed)
CM sent Nurse Navigator a message via [facility] informing her [facility].     Hyman BowerMary Guarino, MSW, [facility]   [facility] Care Manager   [phone]

## 2016-12-18 NOTE — Progress Notes (Signed)
Bedside and Verbal shift change report given to Hendricks Limeshristina F RN  (oncoming nurse) by Leighton RoachVictor TRN (offgoing nurse). Report included the following information SBAR, Kardex, Procedure Summary, Intake/Output, MAR and Accordion.

## 2016-12-18 NOTE — Progress Notes (Signed)
Reason for Admission:   Intractable back pain               RRAT Score:     33             Resources/supports as identified by patient/family:   Pt states she depends upon family for assistance                 Top Challenges facing patient (as identified by patient/family and CM):                       Finances/Medication cost? Pt states she has no problems accessing medications at this time              Transportation?  Pt states she depends upon family, specifically her brother for transportation.               Support system or lack thereof?   Pt states she has family in the area but they are not always accessible when she needs them.                      Living arrangements? Pt lives alone in a ranc[place]             Self-care/ADLs/Cognition?  Pt was oriented x3 and has no problems with basic adls at this time           Current Advanced Directive/Advance Care Plan:  Pt has advanced directives on file but is a full code at this time                           Plan for utilizing home health: Pt has had [facility] in the past (last year) and is willing to utilize them again if recommended.                       Likelihood of readmission: The likelihood of pt being readmitted is high per RRAT score of 33 (high).                  Transition of Care Plan:                    CM met with pt who was alert and oriented x3. Pt confirmed address, emergency contact and PCP. Pt states she lives alone in a one story home with 4 steps to get inside. Pt states she can complete basic ADLS but does not drive at baseline and depends upon her brother & other family for assistance. Pt states her brother will stay with her for several nights as needed. As far as DME pt has a walker, cane and uses 4 liters of O2 from [place]. Pt states she had [facility] last year and went to a SNF in [place] within the last 2 years as well. Pt's preferred pharmacy is [facility] at [place] assist with discharge planning.      Care Management Interventions   PCP Verified by CM: Yes  Mode of Transport at Discharge: ALS Furniture conservator/restorer to SNF)  Transition of Care Consult (CM Consult): SNF  Partner SNF: Yes  MyChart Signup: No  Discharge Durable Medical Equipment: No  Physical Therapy Consult: Yes  Occupational Therapy Consult: No  Speech Therapy Consult: No  Current Support Network: Lives Alone  Confirm Follow Up Transport: Family  Plan discussed with Pt/Family/Caregiver: Yes  Freedom of  Choice Offered: Yes  Discharge Location  Discharge Placement: Skilled nursing facility    CM sent a referral through [facility] to [facility]. [place] list provided to pt and placed on bedside chart. CM will continue to follow pt and assist with discharge planning.        Gay Filler, MSW, [place]   [phone]

## 2016-12-18 NOTE — Other (Signed)
Letter of Status Determination:   Recommend hospitalization status upgraded from   OBSERVATION  to INPATIENT  Status     Pt Name:  Brittney LimesMary S Shelton   MR#   HAR # [id number] /  [id number]   CSN#  [id number]   Room and Hospital  3218/01  @ [facility]   Hospitalization date  12/17/2016  9:39 AM   Current Attending Physician  Darnelle CatalanNsekenene Kolongo, MD   Principal diagnosis  <principal problem not specified>   Intractable Back pain    Clinicals  66 y.o. y.o  female hospitalized with above diagnosis   The pt has failed to improve from her back pain. She returned to ER within short time since her  initial ER visit with no relief.   CT spine shows multilevel lumbar spinal stenosis with moderate spinal stenosis due to disc bulge L4-5 level   She is receiving appropriate care and supportive Rx.   Her care is expected to extend beyond 2 MN for appropriate and necessary medical care.       [place] (MCG) criteria   Does  NOT apply    STATUS DETERMINATION  This patient is at high risk of adverse events and deterioration based on documented clinical data, comorbid conditions and current acute care course.       Brittney Shelton is expected to meet Inpatient Admission status criteria in accordance with CMS regulation Section 43 CFR 412.3. Specifically, due to medical necessity the patient's stay is expected to exceed Two Midnights.     It is our recommendation that this patient's hospitalization status should be upgraded from  OBSERVATION to INPATIENT status.     The final decision of the patient's hospitalization status depends on the attending physician's judgment.         Additional comments     Payor: VA MEDICARE / Plan: VA MEDICARE PART A & B / Product Type: Medicare /         Brittney CarinaHasan M. Sidney Kann MD MPH FACP     Physician Advisor    [facility]   Care Management & Compliant Documentation Management Program  [place] [facility]  [facility]     [facility]   President Medical Staff, [facility]    Cell  [phone]        [phone]    .

## 2016-12-19 LAB — GLUCOSE, POC
Glucose (POC): 191 mg/dL — ABNORMAL HIGH (ref 65–100)
Glucose (POC): 183 mg/dL — ABNORMAL HIGH (ref 65–100)
Glucose (POC): 150 mg/dL — ABNORMAL HIGH (ref 65–100)
Glucose (POC): 162 mg/dL — ABNORMAL HIGH (ref 65–100)

## 2016-12-19 LAB — METABOLIC PANEL, BASIC
Anion gap: 8 mmol/L (ref 5–15)
BUN/Creatinine ratio: 17 (ref 12–20)
BUN: 17 MG/DL (ref 6–20)
CO2: 30 mmol/L (ref 21–32)
Calcium: 8.5 MG/DL (ref 8.5–10.1)
Chloride: 100 mmol/L (ref 97–108)
Creatinine: 1.01 MG/DL (ref 0.55–1.02)
GFR est AA: >60 mL/min/{1.73_m2} (ref >60)
GFR est non-AA: 55 mL/min/{1.73_m2} — ABNORMAL LOW (ref >60)
Glucose: 136 mg/dL — ABNORMAL HIGH (ref 65–100)
Potassium: 4.1 mmol/L (ref 3.5–5.1)
Sodium: 138 mmol/L (ref 136–145)

## 2016-12-19 MED ORDER — METHYLPREDNISOLONE 40 MG/ML SUSP FOR INJECTION
40 mg/mL | Freq: Once | INTRAMUSCULAR | Status: DC
Start: 2016-12-19 — End: 2016-12-19

## 2016-12-19 MED ORDER — METHYLPREDNISOLONE 40 MG/ML SUSP FOR INJECTION
40 mg/mL | INTRAMUSCULAR | Status: AC
Start: 2016-12-19 — End: 2016-12-19
  Administered 2016-12-19: 17:00:00 via INTRA_ARTICULAR

## 2016-12-19 MED ORDER — LIDOCAINE (PF) 10 MG/ML (1 %) IJ SOLN
10 mg/mL (1 %) | INTRAMUSCULAR | Status: AC
Start: 2016-12-19 — End: 2016-12-19
  Administered 2016-12-19: 17:00:00 via INTRA_ARTICULAR

## 2016-12-19 MED FILL — LIDOCAINE (PF) 10 MG/ML (1 %) IJ SOLN: 10 mg/mL (1 %) | INTRAMUSCULAR | Qty: 5

## 2016-12-19 MED FILL — TOPROL XL 50 MG TABLET,EXTENDED RELEASE: 50 mg | ORAL | Qty: 4

## 2016-12-19 MED FILL — ISOSORBIDE DINITRATE 10 MG TAB: 10 mg | ORAL | Qty: 1

## 2016-12-19 MED FILL — BD POSIFLUSH NORMAL SALINE 0.9 % INJECTION SYRINGE: INTRAMUSCULAR | Qty: 10

## 2016-12-19 MED FILL — LOVENOX 40 MG/0.4 ML SUBCUTANEOUS SYRINGE: 40 mg/0.4 mL | SUBCUTANEOUS | Qty: 0.4

## 2016-12-19 MED FILL — AMLODIPINE 5 MG TAB: 5 mg | ORAL | Qty: 2

## 2016-12-19 MED FILL — DEPO-MEDROL 40 MG/ML SUSPENSION FOR INJECTION: 40 mg/mL | INTRAMUSCULAR | Qty: 2

## 2016-12-19 MED FILL — CLONIDINE 0.1 MG TAB: 0.1 mg | ORAL | Qty: 1

## 2016-12-19 MED FILL — IPRATROPIUM-ALBUTEROL 2.5 MG-0.5 MG/3 ML NEB SOLUTION: 2.5 mg-0.5 mg/3 ml | RESPIRATORY_TRACT | Qty: 3

## 2016-12-19 MED FILL — HYDROCODONE-ACETAMINOPHEN 10 MG-325 MG TAB: 10-325 mg | ORAL | Qty: 1

## 2016-12-19 MED FILL — FUROSEMIDE 40 MG TAB: 40 mg | ORAL | Qty: 1

## 2016-12-19 MED FILL — ASPIRIN 81 MG TAB, DELAYED RELEASE: 81 mg | ORAL | Qty: 1

## 2016-12-19 MED FILL — FENTANYL CITRATE (PF) 50 MCG/ML IJ SOLN: 50 mcg/mL | INTRAMUSCULAR | Qty: 2

## 2016-12-19 MED FILL — TIZANIDINE 4 MG TAB: 4 mg | ORAL | Qty: 1

## 2016-12-19 MED FILL — PRAVASTATIN 10 MG TAB: 10 mg | ORAL | Qty: 2

## 2016-12-19 MED FILL — HEALTHYLAX 17 GRAM ORAL POWDER PACKET: 17 gram | ORAL | Qty: 1

## 2016-12-19 MED FILL — DEPO-MEDROL 40 MG/ML SUSPENSION FOR INJECTION: 40 mg/mL | INTRAMUSCULAR | Qty: 1

## 2016-12-19 MED FILL — LISINOPRIL 20 MG TAB: 20 mg | ORAL | Qty: 2

## 2016-12-19 NOTE — Progress Notes (Signed)
ORTHOPEDIC PROGRESS NOTE    December 19, 2016  Admit Date:   12/17/2016    Post Op day: * No surgery found *    Subjective:  Patient complains of severe left knee pain, lesser right knee pain; she states that her back pain has lessened, and is not the major problem now.  She states that before 2 days ago, she had no problem with her knees.      Physical / Occupational Therapy:     Patient Mobility:  Bed Mobility Training  Rolling: Maximum assistance, Assist x2  Sit to Supine: Maximum assistance, Assist x2                     Patient unable to participate with PT too much. She will not move around in bed.    Vital Signs:    Patient Vitals for the past 8 hrs:   BP Temp Pulse Resp SpO2   12/19/16 0938 - - - - 91 %   12/19/16 0808 144/64 97.7 ??F (36.5 ??C) 92 20 92 %     Temp (24hrs), Avg:98.3 ??F (36.8 ??C), Min:97.7 ??F (36.5 ??C), Max:98.8 ??F (37.1 ??C)        Meds:  Current Facility-Administered Medications   Medication Dose Route Frequency   ??? lidocaine (PF) (XYLOCAINE) 10 mg/mL (1 %) injection       ??? methylPREDNISolone acetate (DEPO-MEDROL) 40 mg/mL injection 80 mg  80 mg IntraMUSCular ONCE   ??? polyethylene glycol (MIRALAX) packet 17 g  17 g Oral DAILY   ??? docusate sodium (COLACE) capsule 100 mg  100 mg Oral DAILY PRN   ??? albuterol-ipratropium (DUO-NEB) 2.5 MG-0.5 MG/3 ML  3 mL Nebulization TID RT   ??? furosemide (LASIX) tablet 40 mg  40 mg Oral BID   ??? fentaNYL citrate (PF) injection 25 mcg  25 mcg IntraVENous Q6H PRN   ??? lisinopril (PRINIVIL, ZESTRIL) tablet 40 mg  40 mg Oral DAILY   ??? isosorbide dinitrate (ISORDIL) tablet 30 mg  30 mg Oral TID   ??? cloNIDine HCl (CATAPRES) tablet 0.1 mg  0.1 mg Oral DAILY   ??? aspirin delayed-release tablet 81 mg  81 mg Oral DAILY   ??? tiZANidine (ZANAFLEX) tablet 4 mg  4 mg Oral TID PRN   ??? metoprolol succinate (TOPROL-XL) XL tablet 200 mg  200 mg Oral DAILY   ??? amLODIPine (NORVASC) tablet 10 mg  10 mg Oral DAILY   ??? umeclidinium-vilanterol (ANORO ELLIPTA) 62.5 mcg- 25 mcg/inhalation  1  Puff Inhalation DAILY   ??? pravastatin (PRAVACHOL) tablet 20 mg  20 mg Oral QHS   ??? sodium chloride (NS) flush 5-10 mL  5-10 mL IntraVENous Q8H   ??? sodium chloride (NS) flush 5-10 mL  5-10 mL IntraVENous PRN   ??? acetaminophen (TYLENOL) tablet 650 mg  650 mg Oral Q4H PRN   ??? HYDROcodone-acetaminophen (NORCO) 10-325 mg tablet 1 Tab  1 Tab Oral Q4H PRN   ??? enoxaparin (LOVENOX) injection 40 mg  40 mg SubCUTAneous Q12H         LAB:    Recent Labs      12/17/16   1035   HCT  40.2   HGB  12.5   WBC  9.8     Active Problems:    Intractable back pain (12/17/2016)      Ambulatory dysfunction (12/18/2016)      Discharge Planning: Nursing Home    Physical Examination:   Oriented.  Patient is  quite obese.  Patient unwilling to move either knee. She will move her toes, and dorsiflex her ankle bilaterally.  Calves soft, non-tender. Toes warm and mobile.   Any attempt to move her or have her move is met with complaints of pain.    X-Ray Results:  Severe tricompartmental arthritis of the left knee.  No medial joint space left.    Assessment:  Severe OA of left knee.  Low back pain (arthritis, some stenosis) is improvingl      Plan: After sterile preparation, lidocaine and 80 mg Depo-Medrol injected into left knee. She tolerated the procedure well.  She is to be transferred to NH/SNF on Sunday.  I would like to see her in the office in 7-10 days.    Continue physical therapy, WBAT ambulation.    I have told her that the only way to really help her knee would be to replace it, as she has end stage osteoarthritis.  We will discuss this further in my office.    Ria Clock, MD   12/19/2016

## 2016-12-19 NOTE — Progress Notes (Signed)
Problem: Self Care Deficits Care Plan (Adult)  Goal: *Acute Goals and Plan of Care (Insert Text)  Occupational Therapy Goals:  Initiated 12/19/2016  1. Patient will perform bathing with moderate assistance  within 7 days.   2. Patient will perform toileting with maximal assistance within 7 days.  3. Patient will tolerate 8 minutes of active home exercise program for increased independence with ADLS within 7 days.    4. Patient will tolerate dynamic seated edge of bed grooming with supervision within 7 days.  5. Assess sit to stand and set transfer goal within 7 days.      Occupational Therapy EVALUATION  Patient: Brittney Shelton [phone] y.o. female)  Date: 12/19/2016  Primary Diagnosis: Intractable back pain  Ambulatory dysfunction  Intractable back pain        Precautions: WBAT       ASSESSMENT :  Based on the objective data described below, the patient presents with report of increased pain in left UE and being "unable to move my left arm because of that needle" (IV site).  Pt was informed that IV is in the bend of her elbow and this should not affect shoulder flexion.  AAROM performed through full range left UE with pt guarding at times.  RUE is functional.  Noted pt has DDD L5-S1 and T11-12 and severe bone on bone arthritis in left knee (pending cortisone injection in her knee today).  Upon arrival pt had just been bathed in bed by nursing.  She was willing to attempt bed mobility today but was limited by pain and was unable to lift, nor slide LE to egdge of bed without max assist.  Extensive time needed for pt to attempt any movement due to immediate onset of pain. Only able to assist pts right leg off the bed and then when left was off bed supported in extension pt screamed and was unable to continue.  It took 15 minutes to get pts legs from middle of bed to edge of bed to even attempt sitting up.  She was willing to try but was unable to continue due to pain.  Returned  pt to bed at end of session with total assist.  Total assist to scoot up in bed supine in trendelenburg.  Pt is performing UB ADLS at a set up to total assist level and lower body ADLS at a total assist level.  She was living alone and performing ADLS on her own prior to onset of pain.  Recommend SNF at discharge.      Patient will benefit from skilled intervention to address the above impairments.  Patient???s rehabilitation potential is considered to be Fair if pain is better managed  Factors which may influence rehabilitation potential include:   []              None noted  []              Mental ability/status  []              Medical condition  []              Home/family situation and support systems  []              Safety awareness  [x]              Pain tolerance/management  []              Other:      PLAN :  Recommendations and Planned Interventions:  [x]   Self Care Training                  [x]         Therapeutic Activities  [x]                Functional Mobility Training    []         Cognitive Retraining  [x]                Therapeutic Exercises           []         Endurance Activities  [x]                Balance Training                   []         Neuromuscular Re-Education  []                Visual/Perceptual Training     [x]    Home Safety Training  [x]                Patient Education                 [x]         Family Training/Education  []                Other (comment):    Frequency/Duration: Patient will be followed by occupational therapy 4 times a week to address goals.  Discharge Recommendations: Skilled Nursing Facility  Further Equipment Recommendations for Discharge: TBD     SUBJECTIVE:   Patient stated ???I will try.  I would like to at least sit on the side of the bed.  Oh! I can't my leg!Marland Kitchen???    OBJECTIVE DATA SUMMARY:   HISTORY:   Past Medical History:   Diagnosis Date   ??? Chronic obstructive pulmonary disease (HCC)    ??? Congestive heart failure (HCC)    ??? Diabetes (HCC)    ??? Hypertension     History reviewed. No pertinent surgical history.    Prior Level of Function/Environment/Context: ambulated with SPC in the home; does not drive; family takes her to the store and she was riding in the electric cart; home bound; nephew checks on pt and her brother lives down the street from her; nephew has been needing to assist over the past few weeks due to pts pain; on home O2 4 liters at baseline 24/7, has neuropathy, was able to step over bathtub and stand and shower; could perform all ADLS on her own and majority of household tasks; recently pt has chair bound and unable to get into her bed/toilet (sitting on depends and getting cleaned up or performing ADLS due to severe pain  Expanded or extensive additional review of patient history:     Home Situation  Home Environment: Private residence  # Steps to Enter: 3  Rails to Enter: Yes  One/Two Story Residence: One story  Living Alone: Yes  Support Systems: Family member(s)  Patient Expects to be Discharged to:: Unknown  Current DME Used/Available at Home: Walker, rolling, Oxygen, portable (4 liters NC)  Tub or Shower Type: Tub/Shower combination    Hand dominance: Right    EXAMINATION OF PERFORMANCE DEFICITS:  Cognitive/Behavioral Status:  Neurologic State: Alert  Orientation Level: Oriented X4  Cognition: Follows commands  Perception: Appears intact  Perseveration: No perseveration noted  Safety/Judgement: Awareness of environment        Hearing:  Auditory  Auditory Impairment: None    Vision/Perceptual:    Tracking: Able to track stimulus in all quadrants w/o difficulty                      Acuity:  (grossly intact)         Range of Motion:    AROM: Generally decreased, functional (limited by pain)                         Strength:    Strength: Within functional limits (BUE; unable to test BLE due to pain; unable to slide LE EOB)                Coordination:  Coordination: Within functional limits  Fine Motor Skills-Upper: Left Intact;Right Intact     Gross Motor Skills-Upper: Left Intact;Right Intact       Balance:  Sitting - Static:  (unable due to severe pain with minimal movement)    Functional Mobility and Transfers for ADLs:  Bed Mobility:  Rolling: Maximum assistance;Total assistance;Assist x2  Supine to Sit:  (unable; total assist for LE to EOB/aborted d/t severe pain)  Scooting: Total assistance;Assist x2    Transfers:  Sit to Stand:  (unable at this time)  Bed to Chair:  (unable at this time)  Toilet Transfer :  (unable at this time)    ADL Assessment:  Feeding: Supervision (once provided; needs encouragement to use left UE)    Oral Facial Hygiene/Grooming: Setup (needs encouragement to use left UE)    Bathing: Total assistance    Upper Body Dressing: Total assistance    Lower Body Dressing: Total assistance    Toileting: Total assistance                ADL Intervention and task modifications:     See assessment  Cognitive Retraining  Safety/Judgement: Awareness of environment      Functional Measure:  Barthel Index:    Bathing: 0  Bladder: 10  Bowels: 10  Grooming: 0  Dressing: 0  Feeding: 5  Mobility: 0  Stairs: 0  Toilet Use: 0  Transfer (Bed to Chair and Back): 0  Total: 25       Barthel and G-code impairment scale:  Percentage of impairment CH  0% CI  1-19% CJ  20-39% CK  40-59% CL  60-79% CM  80-99% CN  100%   Barthel Score 0-100 100 99-80 79-60 59-40 20-39 1-19   0   Barthel Score 0-20 20 17-19 13-16 9-12 5-8 1-4 0      The Barthel ADL Index: Guidelines  1. The index should be used as a record of what a patient does, not as a record of what a patient could do.  2. The main aim is to establish degree of independence from any help, physical or verbal, however minor and for whatever reason.  3. The need for supervision renders the patient not independent.  4. A patient's performance should be established using the best available evidence. Asking the patient, friends/relatives and nurses are the usual  sources, but direct observation and common sense are also important. However direct testing is not needed.  5. Usually the patient's performance over the preceding 24-48 hours is important, but occasionally longer periods will be relevant.  6. Middle categories imply that the patient supplies over 50 per cent of the effort.  7. Use of aids to be independent is allowed.    Leary RocaMahoney,  F.l., Barthel, D.W. (1965). Functional evaluation: the Barthel Index. Md State Med J (14)2.  Zenaida Niece der [place], J.J.M.F, [place], Ian Malkin., Margret Chance., [place], [place]. (1999). Measuring the change indisability after inpatient rehabilitation; comparison of the responsiveness of the Barthel Index and Functional Independence Measure. Journal of Neurology, Neurosurgery, and Psychiatry, 66(4), [phone].  Dawson Bills, N.J.A, Scholte op Bray,  W.J.M, & Koopmanschap, M.A. (2004.) Assessment of post-stroke quality of life in cost-effectiveness studies: The usefulness of the Barthel Index and the EuroQoL-5D. Quality of Life Research, 13, 981-19         G codes:  In compliance with CMS???s Claims Based Outcome Reporting, the following G-code set was chosen for this patient based on their primary functional limitation being treated:    The outcome measure chosen to determine the severity of the functional limitation was the bathel with a score of 25/100 which was correlated with the impairment scale.    ? Self Care:    [phone] - CURRENT STATUS: CL - 60%-79% impaired, limited or restricted   N5621 - GOAL STATUS: CK - 40%-59% impaired, limited or restricted   H0865 - D/C STATUS:  ---------------To be determined---------------     Occupational Therapy Evaluation Charge Determination   History Examination Decision-Making   LOW Complexity : Brief history review  LOW Complexity : 1-3 performance deficits relating to physical, cognitive , or psychosocial skils that result in activity limitations and / or participation restrictions  MEDIUM  Complexity : Patient may present with comorbidities that affect occupational performnce. Miniml to moderate modification of tasks or assistance (eg, physical or verbal ) with assesment(s) is necessary to enable patient to complete evaluation       Based on the above components, the patient evaluation is determined to be of the following complexity level: LOW   Pain:  Pain Scale 1: Numeric (0 - 10)  Pain Intensity 1: 10  Pain Location 1: Knee  Pain Orientation 1: Left  Pain Description 1: Aching  Pain Intervention(s) 1: Medication (see MAR)  Activity Tolerance:     Please refer to the flowsheet for vital signs taken during this treatment.  After treatment:   []  Patient left in no apparent distress sitting up in chair  [x]  Patient left in no apparent distress in bed  [x]  Call bell left within reach  [x]  Nursing notified  []  Caregiver present  []  Bed alarm activated    COMMUNICATION/EDUCATION:   The patient???s plan of care was discussed with: Physical Therapist, Registered Nurse and pateint.  []  Home safety education was provided and the patient/caregiver indicated understanding.  [x]  Patient have participated as able in goal setting and plan of care.  [x]  Patient agree to work toward stated goals and plan of care.  []  Patient understands intent and goals of therapy, but is neutral about his/her participation.  []  Patient is unable to participate in goal setting and plan of care.  This patient???s plan of care is appropriate for delegation to OTA.    Thank you for this referral.  Arlys John, OTR/L  Time Calculation: 27 mins

## 2016-12-19 NOTE — Progress Notes (Addendum)
Pt will discharge to [facility] on Sunday 12/21/16. Call report number for floor nurse is: [phone]. Bed assignment will be provided once report is called. [facility] will transport at 11:00 am. CM completed PCS documentation for documentation and it is on bedside chart.     CM provided second IM letter to pt and placed copy on bedside chart.     Care Management Interventions  PCP Verified by CM: Yes  Mode of Transport at Discharge: BLS Teaching laboratory technician(Transportation to SNF)  Transition of Care Consult (CM Consult): SNF  Partner SNF: Yes  MyChart Signup: No  Discharge Durable Medical Equipment: No  Physical Therapy Consult: Yes  Occupational Therapy Consult: No  Speech Therapy Consult: No  Current Support Network: Lives Alone  Confirm Follow Up Transport: Family  Plan discussed with Pt/Family/Caregiver: Yes  Freedom of Choice Offered: Yes  Discharge Location  Discharge Placement: Skilled nursing facility    Hyman BowerMary Guarino, MSW, CM   [facility] Care Manager   [phone]

## 2016-12-19 NOTE — Progress Notes (Signed)
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  Recently seen in ED, given Rx for zanaflex and norco as well as Rx for back exercises; however presented again 7/18 with no relief of symptoms. Has also tried tylenoll #3. 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  Admitted for management, case mgmnt/social work consulted  c/w norco 10-325 1tab PO q4hrs prn for mod pain  Fentanyl iv prn   Ortho consulted -> saw pt on 7/19:advised to continue with pain management.  Approved for SNF/Rehab on 7/22.    Advanced osteoarthritis left knee causing ambulatory dysfunction.  Lidocaine and 80 mg Depo-Medrol injected into left knee by ortho on 12/19/16  PT/OT  ??  Constipation  Start colace and miralax  ??  Supermorbid obesity  -Counseled on lifestyle modifications  -currently limited by intractable pain, which she states prevents her from engaging PT or other physical activity  ??  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  Hx of chronic LE edema  PRN lasix at home  Hold for now, monitor and start if LE swelling emerges  ??  Code Status: Full  Surrogate Decision Maker: Husband  DVT Prophylaxis: SQ lovenox  GI Prophylaxis: not indicated  ??  Baseline: lives at home by herself, currently unable to get of of bed without and unable to participate in PT while in ED. On 4L NC O2 continuous at baseline    Disposition:rehab/SNF     Body mass index is 50.84 kg/(m^2).     Subjective:     Chief Complaint / Reason for Physician Visit  Patient with multilevel degenerative disc disease with multilevel stenosis from T11-12   through L5-S1,admitted for intractably back pain limiting ambulation,left knee pain.Feeling better today.Discussed with RN events overnight.Pt still complaining of a lot of pain,comfined in bed.    Review of Systems:  Symptom Y/N Comments  Symptom Y/N Comments   Fever/Chills n   Chest Pain n    Poor Appetite n   Edema n    Cough n   Abdominal Pain     Sputum    Joint Pain y Back pain,knee pain improving   SOB/DOE    Pruritis/Rash     Nausea/vomit    Tolerating PT/OT     Diarrhea n   Tolerating Diet     Constipation y   Other       Could NOT obtain due to:      Objective:     VITALS:   Last 24hrs VS reviewed since prior progress note. Most recent are:  Patient Vitals for the past 24 hrs:   Temp Pulse Resp BP SpO2   12/19/16 0938 - - - - 91 %   12/19/16 0808 97.7 ??F (36.5 ??C) 92 20 144/64 92 %   12/19/16 0441 98.8 ??F (37.1 ??C) 86 18 137/62 90 %   12/18/16 2200 - - - - 92 %   12/18/16 2112 97.9 ??F (36.6 ??C) 73 18 129/82 95 %   12/18/16 1546  98.7 ??F (37.1 ??C) 82 18 (!) 148/92 93 %       Intake/Output Summary (Last 24 hours) at 12/19/16 1336  Last data filed at 12/19/16 0938   Gross per 24 hour   Intake              380 ml   Output              900 ml   Net             -520 ml        PHYSICAL EXAM:  General: WD, WN. Alert, cooperative, no acute distress????  EENT:  EOMI. Anicteric sclerae. MMM  Resp:  CTA bilaterally, no wheezing or rales.  No accessory muscle use  CV:  Regular  rhythm,?? No edema  GI:  Soft, Non distended, Non tender. ??+Bowel sounds  Neurologic:?? Alert and oriented X 3, normal speech,   Back:               Diffuse tenderness to palpation lumbar area,less than previous days.  Extr:                 Tenderness to extension/flexion left knee with crepitation,no effusion.  Psych:???? Good insight.??Not anxious nor agitated  Skin:  No rashes.  No jaundice    Reviewed most current lab test results and cultures  YES  Reviewed most current radiology test results   YES  Review and summation of old records today    NO   Reviewed patient's current orders and MAR    YES  PMH/SH reviewed - no change compared to H&P  ________________________________________________________________________  Care Plan discussed with:    Comments   Patient x    Family      RN x    Care Manager x    Consultant  x ortho                     Multidiciplinary team rounds were held today with case manager, nursing, pharmacist and Higher education careers adviser.  Patient's plan of care was discussed; medications were reviewed and discharge planning was addressed.     ________________________________________________________________________  Total NON critical care TIME: 25   Minutes    Total CRITICAL CARE TIME Spent:   Minutes non procedure based      Comments   >50% of visit spent in counseling and coordination of care x    ________________________________________________________________________  Darnelle Catalan, MD     Procedures: see electronic medical records for all procedures/Xrays and details which were not copied into this note but were reviewed prior to creation of Plan.      LABS:  I reviewed today's most current labs and imaging studies.  Pertinent labs include:  Recent Labs      12/17/16   1035   WBC  9.8   HGB  12.5   HCT  40.2   PLT  241     Recent Labs      12/19/16   0306  12/17/16   1035   NA  138  142   K  4.1  4.0   CL  100  105   CO2  30  28   GLU  136*  100   BUN  17  18   CREA  1.01  0.97   CA  8.5  8.9   ALB   --   3.0*   TBILI   --   1.0  SGOT   --   20   ALT   --   10*       Signed: Darnelle Catalan, MD

## 2016-12-19 NOTE — Progress Notes (Signed)
After speaking with RN Riki RuskJeremy and [facility] Betha Loa(Amonie) pt is not medically stable to leave this weekend. Will reassess on Monday pending pt oxygen levels improving.     Care Management Interventions  PCP Verified by CM: Yes  Mode of Transport at Discharge: BLS Teaching laboratory technician(Transportation to SNF)  Transition of Care Consult (CM Consult): SNF  Partner SNF: Yes  MyChart Signup: No  Discharge Durable Medical Equipment: No  Physical Therapy Consult: Yes  Occupational Therapy Consult: No  Speech Therapy Consult: No  Current Support Network: Lives Alone  Confirm Follow Up Transport: Family  Plan discussed with Pt/Family/Caregiver: Yes  Freedom of Choice Offered: Yes  Discharge Location  Discharge Placement: Skilled nursing facility    Hyman BowerMary Guarino, MSW, CM   [facility] Care Manager   [phone]

## 2016-12-19 NOTE — Progress Notes (Signed)
Attempted to see pt this am and pt not able to tolerate tx at this time 2/2 to pain. Pt c/o 10/10 pain in her left knee and 9/10 pain in her back.  Could not even get a sock on her left foot without pt wincing in pain.  Will continue to follow and tx when pt's pain is better under controll.

## 2016-12-19 NOTE — Progress Notes (Signed)
ADULT PROTOCOL: JET AEROSOL  REASSESSMENT    Patient  Brittney Shelton     66 y.o.   female     12/19/2016  10:24 AM    Breath Sounds Pre Procedure: Right Breath Sounds: Diminished                               Left Breath Sounds: Diminished    Breath Sounds Post Procedure: Right Breath Sounds: Diminished                                 Left Breath Sounds: Diminished    Breathing pattern: Pre procedure Breathing Pattern: Dyspnea at rest, Regular          Post procedure Breathing Pattern: Dyspnea at rest, Regular    Heart Rate: Pre procedure Pulse: 88           Post procedure Pulse: 88    Resp Rate: Pre procedure Respirations: 20           Post procedure Respirations: 20            Cough: Pre procedure Cough: Non-productive               Post procedure Cough: Non-productive      Oxygen: O2 Device: Nasal cannula   Flow rate (L/min) 6 lpm     Changed: NO/ Baseline 4 lpm    SpO2: Pre procedure SpO2: 91 %   with oxygen              Post procedure SpO2: 90 %  with oxygen    Nebulizer Therapy: Current medications Aerosolized Medications: DuoNeb      Changed: NO    Problem List:   Patient Active Problem List   Diagnosis Code   ??? Well controlled type 2 diabetes mellitus (HCC) E11.9   ??? Hypercholesterolemia E78.00   ??? 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 COPD (chronic obstructive pulmonary disease) (HCC) J44.9   ??? Obesity E66.9   ??? Requires continuous at home supplemental oxygen Z99.81   ??? Dependence on supplemental oxygen Z99.81   ??? ACP (advance care planning) Z71.89   ??? Counseling regarding advanced care planning and goals of care Z71.89   ??? Dyspnea R06.00   ??? Acute on chronic diastolic (congestive) heart failure (HCC) I50.33   ??? Obesity, morbid (HCC) E66.01   ??? 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 Intractable back pain M54.9   ??? Ambulatory dysfunction R26.2       Respiratory Therapist: Tora KindredKimberly K Colgin, RT

## 2016-12-20 LAB — GLUCOSE, POC
Glucose (POC): 261 mg/dL — ABNORMAL HIGH (ref 65–100)
Glucose (POC): 222 mg/dL — ABNORMAL HIGH (ref 65–100)
Glucose (POC): 188 mg/dL — ABNORMAL HIGH (ref 65–100)
Glucose (POC): 196 mg/dL — ABNORMAL HIGH (ref 65–100)

## 2016-12-20 MED FILL — ASPIRIN 81 MG TAB, DELAYED RELEASE: 81 mg | ORAL | Qty: 1

## 2016-12-20 MED FILL — BD POSIFLUSH NORMAL SALINE 0.9 % INJECTION SYRINGE: INTRAMUSCULAR | Qty: 10

## 2016-12-20 MED FILL — LOVENOX 40 MG/0.4 ML SUBCUTANEOUS SYRINGE: 40 mg/0.4 mL | SUBCUTANEOUS | Qty: 0.4

## 2016-12-20 MED FILL — FUROSEMIDE 40 MG TAB: 40 mg | ORAL | Qty: 1

## 2016-12-20 MED FILL — PRAVASTATIN 10 MG TAB: 10 mg | ORAL | Qty: 2

## 2016-12-20 MED FILL — ISOSORBIDE DINITRATE 20 MG TAB: 20 mg | ORAL | Qty: 2

## 2016-12-20 MED FILL — HEALTHYLAX 17 GRAM ORAL POWDER PACKET: 17 gram | ORAL | Qty: 1

## 2016-12-20 MED FILL — HYDROCODONE-ACETAMINOPHEN 10 MG-325 MG TAB: 10-325 mg | ORAL | Qty: 1

## 2016-12-20 MED FILL — IPRATROPIUM-ALBUTEROL 2.5 MG-0.5 MG/3 ML NEB SOLUTION: 2.5 mg-0.5 mg/3 ml | RESPIRATORY_TRACT | Qty: 3

## 2016-12-20 MED FILL — AMLODIPINE 5 MG TAB: 5 mg | ORAL | Qty: 2

## 2016-12-20 MED FILL — TOPROL XL 50 MG TABLET,EXTENDED RELEASE: 50 mg | ORAL | Qty: 4

## 2016-12-20 MED FILL — CLONIDINE 0.1 MG TAB: 0.1 mg | ORAL | Qty: 1

## 2016-12-20 MED FILL — LISINOPRIL 20 MG TAB: 20 mg | ORAL | Qty: 2

## 2016-12-20 NOTE — Other (Signed)
Bedside and Verbal shift change report given to Victor RN (oncoming nurse) by Amanda RN (offgoing nurse). Report included the following information SBAR, Kardex, Intake/Output, MAR and Recent Results.

## 2016-12-20 NOTE — Progress Notes (Signed)
ADULT PROTOCOL: JET AEROSOL  REASSESSMENT    Patient  Brittney Shelton     66 y.o.   female     12/20/2016  8:59 AM    Breath Sounds Pre Procedure: Right Breath Sounds: Clear, Diminished                               Left Breath Sounds: Clear, Diminished    Breath Sounds Post Procedure: Right Breath Sounds: Clear, Diminished                                 Left Breath Sounds: Clear, Diminished    Breathing pattern: Pre procedure Breathing Pattern: Regular          Post procedure Breathing Pattern: Regular    Heart Rate: Pre procedure Pulse: 76           Post procedure Pulse: 80    Resp Rate: Pre procedure Respirations: 20           Post procedure Respirations: 20            Cough: Pre procedure Cough: Non-productive               Post procedure Cough: Non-productive      Oxygen: O2 Device: Nasal cannula   Flow rate (L/min) 4 lpm     Changed: NO    SpO2: Pre procedure SpO2: 93 %   with oxygen              Post procedure SpO2: 94 %  with oxygen    Nebulizer Therapy: Current medications Aerosolized Medications: DuoNeb      Changed: NO    Smoking History: never    Problem List:   Patient Active Problem List   Diagnosis Code   ??? Well controlled type 2 diabetes mellitus (HCC) E11.9   ??? Hypercholesterolemia E78.00   ??? 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 COPD (chronic obstructive pulmonary disease) (HCC) J44.9   ??? Obesity E66.9   ??? Requires continuous at home supplemental oxygen Z99.81   ??? Dependence on supplemental oxygen Z99.81   ??? ACP (advance care planning) Z71.89   ??? Counseling regarding advanced care planning and goals of care Z71.89   ??? Dyspnea R06.00   ??? Acute on chronic diastolic (congestive) heart failure (HCC) I50.33   ??? Obesity, morbid (HCC) E66.01   ??? 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 Intractable back pain M54.9   ??? Ambulatory dysfunction R26.2   ??? 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

## 2016-12-20 NOTE — Progress Notes (Signed)
Spiritual Care Partner Volunteer visited patient in Ortho on 12/20/16.  Documented by:  Hilja Kintzel, M.Div  Chaplain

## 2016-12-20 NOTE — Progress Notes (Signed)
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  Recently seen in ED, given Rx for zanaflex and norco as well as Rx for back exercises; however presented again 7/18 with no relief of symptoms. Has also tried tylenoll #3. 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  Admitted for management, case mgmnt/social work consulted  c/w norco 10-325 1tab PO q4hrs prn for mod pain  Fentanyl iv prn   Ortho consulted -> saw pt on 7/19:advised to continue with pain management.  Approved for SNF/Rehab on 7/23    Advanced osteoarthritis left knee causing ambulatory dysfunction.  Lidocaine and 80 mg Depo-Medrol injected into left knee by ortho on 12/19/16  PT/OT  ??  Constipation  Start colace and miralax  ??  Supermorbid obesity  -Counseled on lifestyle modifications  -currently limited by intractable pain, which she states prevents her from engaging PT or other physical activity  ??  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  Hx of chronic LE edema  PRN lasix at home  Hold for now, monitor and start if LE swelling emerges  ??  Code Status: Full  Surrogate Decision Maker: Husband  DVT Prophylaxis: SQ lovenox  GI Prophylaxis: not indicated  ??  Baseline: lives at home by herself, currently unable to get of of bed without and unable to participate in PT while in ED. On 4L NC O2 continuous at baseline    Disposition:rehab/SNF     Body mass index is 50.84 kg/(m^2).     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    HPI:  Pt seen and examined at the bedside  Feeling better today.  Desats to low 80's on 4 lit-increased to 5 lit  Discussed with RN events overnight.    Review of Systems:  Symptom Y/N Comments  Symptom Y/N Comments   Fever/Chills n   Chest Pain n    Poor Appetite n   Edema n    Cough n   Abdominal Pain     Sputum    Joint Pain y Back pain,knee pain improving   SOB/DOE    Pruritis/Rash     Nausea/vomit    Tolerating PT/OT     Diarrhea n   Tolerating Diet     Constipation y   Other       Could NOT obtain due to:      Objective:     VITALS:   Last 24hrs VS reviewed since prior progress note. Most recent are:  Patient Vitals for the past 24 hrs:   Temp Pulse Resp BP SpO2   12/20/16 1339 - - - - 94 %   12/20/16 1140 98 ??F (36.7 ??C) 75 20 109/55 (!) 89 %   12/20/16 0751 - - - - 93 %   12/20/16 0736 98.3 ??F (36.8 ??C) 75 20 148/79 93 %  12/20/16 0441 98 ??F (36.7 ??C) 80 22 122/60 92 %   12/19/16 2143 - - - - 90 %   12/19/16 2105 98.6 ??F (37 ??C) 94 20 119/58 97 %   12/19/16 1718 98.8 ??F (37.1 ??C) 84 22 136/74 (!) 84 %   12/19/16 1526 - - - - 91 %       Intake/Output Summary (Last 24 hours) at 12/20/16 1503  Last data filed at 12/20/16 1321   Gross per 24 hour   Intake              240 ml   Output              550 ml   Net             -310 ml        PHYSICAL EXAM:  General: WD, WN. Alert, cooperative, no acute distress????  EENT:  EOMI. Anicteric sclerae. MMM  Resp:  CTA bilaterally, no wheezing or rales.  No accessory muscle use  CV:  Regular  rhythm,?? No edema  GI:  Soft, Non distended, Non tender. ??+Bowel sounds  Neurologic:?? Alert and oriented X 3, normal speech,   Back:               Diffuse tenderness to palpation lumbar area,less than previous days.  Extr:                 Tenderness to extension/flexion left knee with crepitation,no effusion.  Psych:???? Good insight.??Not anxious nor agitated  Skin:  No rashes.  No jaundice     Reviewed most current lab test results and cultures  YES  Reviewed most current radiology test results   YES  Review and summation of old records today    NO  Reviewed patient's current orders and MAR    YES  PMH/SH reviewed - no change compared to H&P  ________________________________________________________________________  Care Plan discussed with:    Comments   Patient x    Family      RN x    Care Manager x    Consultant  x ortho                     Multidiciplinary team rounds were held today with case manager, nursing, pharmacist and Higher education careers adviser.  Patient's plan of care was discussed; medications were reviewed and discharge planning was addressed.     ________________________________________________________________________  Total NON critical care TIME: 25   Minutes    Total CRITICAL CARE TIME Spent:   Minutes non procedure based      Comments   >50% of visit spent in counseling and coordination of care x    ________________________________________________________________________  [name], MD     Procedures: see electronic medical records for all procedures/Xrays and details which were not copied into this note but were reviewed prior to creation of Plan.      LABS:  I reviewed today's most current labs and imaging studies.  Pertinent labs include:  No results for input(s): WBC, HGB, HCT, PLT, HGBEXT, HCTEXT, PLTEXT, HGBEXT, HCTEXT, PLTEXT in the last 72 hours.  Recent Labs      12/19/16   0306   NA  138   K  4.1   CL  100   CO2  30   GLU  136*   BUN  17   CREA  1.01   CA  8.5       Signed: Karlyn Glasco  Huston FoleyMadhunapantula, MD

## 2016-12-21 LAB — GLUCOSE, POC
Glucose (POC): 203 mg/dL — ABNORMAL HIGH (ref 65–100)
Glucose (POC): 88 mg/dL (ref 65–100)
Glucose (POC): 125 mg/dL — ABNORMAL HIGH (ref 65–100)
Glucose (POC): 125 mg/dL — ABNORMAL HIGH (ref 65–100)

## 2016-12-21 MED FILL — HYDROCODONE-ACETAMINOPHEN 10 MG-325 MG TAB: 10-325 mg | ORAL | Qty: 1

## 2016-12-21 MED FILL — PRAVASTATIN 10 MG TAB: 10 mg | ORAL | Qty: 2

## 2016-12-21 MED FILL — ISOSORBIDE DINITRATE 20 MG TAB: 20 mg | ORAL | Qty: 2

## 2016-12-21 MED FILL — CLONIDINE 0.1 MG TAB: 0.1 mg | ORAL | Qty: 1

## 2016-12-21 MED FILL — FUROSEMIDE 40 MG TAB: 40 mg | ORAL | Qty: 1

## 2016-12-21 MED FILL — IPRATROPIUM-ALBUTEROL 2.5 MG-0.5 MG/3 ML NEB SOLUTION: 2.5 mg-0.5 mg/3 ml | RESPIRATORY_TRACT | Qty: 3

## 2016-12-21 MED FILL — LISINOPRIL 20 MG TAB: 20 mg | ORAL | Qty: 2

## 2016-12-21 MED FILL — BD POSIFLUSH NORMAL SALINE 0.9 % INJECTION SYRINGE: INTRAMUSCULAR | Qty: 10

## 2016-12-21 MED FILL — AMLODIPINE 5 MG TAB: 5 mg | ORAL | Qty: 2

## 2016-12-21 MED FILL — LOVENOX 40 MG/0.4 ML SUBCUTANEOUS SYRINGE: 40 mg/0.4 mL | SUBCUTANEOUS | Qty: 0.4

## 2016-12-21 MED FILL — TOPROL XL 50 MG TABLET,EXTENDED RELEASE: 50 mg | ORAL | Qty: 4

## 2016-12-21 MED FILL — ASPIRIN 81 MG TAB, DELAYED RELEASE: 81 mg | ORAL | Qty: 1

## 2016-12-21 MED FILL — HEALTHYLAX 17 GRAM ORAL POWDER PACKET: 17 gram | ORAL | Qty: 1

## 2016-12-21 NOTE — Progress Notes (Addendum)
CM spoke with nursing staff, was informed that Pt would not be medically cleared to discharge today. CM notified [facility] via [employer] to cancel transportation.       CM rescheduled [facility] transportation for 12/22/16 at 12pm.         CM will continue to follow-up with discharge planning.         Caroll Rancherallas Crowder, [place], CM  [employer] [place] Care Manager  [phone]

## 2016-12-21 NOTE — Progress Notes (Signed)
Bedside and Verbal shift change report given to Amanda M RN (oncoming nurse) by Victor RN (offgoing nurse). Report included the following information SBAR, Kardex, Intake/Output, MAR and Accordion.

## 2016-12-21 NOTE — Progress Notes (Signed)
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  Recently seen in ED, given Rx for zanaflex and norco as well as Rx for back exercises; however presented again 7/18 with no relief of symptoms. Has also tried tylenoll #3. 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  Admitted for management, case mgmnt/social work consulted  c/w norco 10-325 1tab PO q4hrs prn for mod pain  Fentanyl iv prn   Ortho consulted -> saw pt on 7/19:advised to continue with pain management.  Approved for SNF/Rehab on 7/23    Advanced osteoarthritis left knee causing ambulatory dysfunction.  Lidocaine and 80 mg Depo-Medrol injected into left knee by ortho on 12/19/16  PT/OT  ??  Constipation  Start colace and miralax  ??  Supermorbid obesity  -Counseled on lifestyle modifications  -currently limited by intractable pain, which she states prevents her from engaging PT or other physical activity  ??  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  Hx of chronic LE edema  PRN lasix at home  Hold for now, monitor and start if LE swelling emerges  ??  Code Status: Full  Surrogate Decision Maker: Husband  DVT Prophylaxis: SQ lovenox  GI Prophylaxis: not indicated  ??  Baseline: lives at home by herself, currently unable to get of of bed without and unable to participate in PT while in ED. On 4L NC O2 continuous at baseline    Disposition:rehab/SNF     Body mass index is 50.84 kg/(m^2).     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    HPI:  Pt seen and examined at the bedside  Feeling better today.   C/o R.Knee pain-wondering if Ortho can give her a shot to R.knee as well  Discussed with RN events overnight.    Review of Systems:  Symptom Y/N Comments  Symptom Y/N Comments   Fever/Chills n   Chest Pain n    Poor Appetite n   Edema n    Cough n   Abdominal Pain     Sputum    Joint Pain y Back pain,knee pain improving   SOB/DOE    Pruritis/Rash     Nausea/vomit    Tolerating PT/OT     Diarrhea n   Tolerating Diet     Constipation y   Other       Could NOT obtain due to:      Objective:     VITALS:   Last 24hrs VS reviewed since prior progress note. Most recent are:  Patient Vitals for the past 24 hrs:   Temp Pulse Resp BP SpO2   12/21/16 1253 98.4 ??F (36.9 ??C) 77 20 114/61 (!) 89 %   12/21/16 0734 97.6 ??F (36.4 ??C) 72 20 150/66 91 %   12/21/16 0718 - - - - 95 %   12/21/16 0451 97.9 ??F (36.6 ??  C) 70 20 123/74 92 %   12/21/16 0036 98.1 ??F (36.7 ??C) 73 18 111/53 90 %   12/20/16 2154 - 70 - 128/61 -   12/20/16 2051 97.7 ??F (36.5 ??C) 70 20 121/58 91 %   12/20/16 1927 - - - - 92 %   12/20/16 1838 - 75 - 112/57 90 %   12/20/16 1616 98 ??F (36.7 ??C) 75 20 110/56 90 %       Intake/Output Summary (Last 24 hours) at 12/21/16 1409  Last data filed at 12/21/16 1025   Gross per 24 hour   Intake              300 ml   Output                0 ml   Net              300 ml        PHYSICAL EXAM:  General: WD, WN. Alert, cooperative, no acute distress????  EENT:  EOMI. Anicteric sclerae. MMM  Resp:  CTA bilaterally, no wheezing or rales.  No accessory muscle use  CV:  Regular  rhythm,?? No edema  GI:  Soft, Non distended, Non tender. ??+Bowel sounds  Neurologic:?? Alert and oriented X 3, normal speech,   Back:               Decreased tenderness to palpation lumbar area,less than previous days.  Extr:                 Tenderness decreased L knee    Psych:???? Good insight.??Not anxious nor agitated  Skin:  No rashes.  No jaundice    Reviewed most current lab test results and cultures  YES  Reviewed most current radiology test results   YES  Review and summation of old records today    NO  Reviewed patient's current orders and MAR    YES  PMH/SH reviewed - no change compared to H&P  ________________________________________________________________________  Care Plan discussed with:    Comments   Patient x    Family      RN x    Care Manager x    Consultant  x ortho                     Multidiciplinary team rounds were held today with case manager, nursing, pharmacist and Higher education careers adviser.  Patient's plan of care was discussed; medications were reviewed and discharge planning was addressed.     ________________________________________________________________________  Total NON critical care TIME: 25   Minutes    Total CRITICAL CARE TIME Spent:   Minutes non procedure based      Comments   >50% of visit spent in counseling and coordination of care x    ________________________________________________________________________  [name], MD     Procedures: see electronic medical records for all procedures/Xrays and details which were not copied into this note but were reviewed prior to creation of Plan.      LABS:  I reviewed today's most current labs and imaging studies.  Pertinent labs include:  No results for input(s): WBC, HGB, HCT, PLT, HGBEXT, HCTEXT, PLTEXT, HGBEXT, HCTEXT, PLTEXT in the last 72 hours.  Recent Labs      12/19/16   0306   NA  138   K  4.1   CL  100   CO2  30   GLU  136*   BUN  17  CREA  1.01   CA  8.5       Signed: [name], MD

## 2016-12-21 NOTE — Other (Signed)
Bedside and Verbal shift change report given to Victor RN (oncoming nurse) by Amanda RN (offgoing nurse). Report included the following information SBAR, Kardex, Intake/Output, MAR and Med Rec Status.

## 2016-12-21 NOTE — Progress Notes (Signed)
ADULT PROTOCOL: JET AEROSOL  REASSESSMENT    Patient  Brittney LimesMary S Shelton     66 y.o.   female     12/21/2016  8:52 AM    Breath Sounds Pre Procedure: Right Breath Sounds: Clear, Diminished                               Left Breath Sounds: Clear, Diminished    Breath Sounds Post Procedure: Right Breath Sounds: Clear, Diminished                                 Left Breath Sounds: Clear, Diminished    Breathing pattern: Pre procedure Breathing Pattern: Regular          Post procedure Breathing Pattern: Regular    Heart Rate: Pre procedure Pulse: 64           Post procedure Pulse: 74    Resp Rate: Pre procedure Respirations: 20           Post procedure Respirations: 18      Oxygen: O2 Device: Nasal cannula   Flow rate (L/min) 4     Changed: No       SpO2: Pre procedure SpO2: 95 %    5LPM NC              Post procedure SpO2: 93 %  4LPM NC      Nebulizer Therapy: Current medications Aerosolized Medications: DuoNeb TID       Changed: No       Smoking History: Never Smoked        Problem List:   Patient Active Problem List   Diagnosis Code   ??? Well controlled type 2 diabetes mellitus (HCC) E11.9   ??? Hypercholesterolemia E78.00   ??? 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 COPD (chronic obstructive pulmonary disease) (HCC) J44.9   ??? Obesity E66.9   ??? Requires continuous at home supplemental oxygen Z99.81   ??? Dependence on supplemental oxygen Z99.81   ??? ACP (advance care planning) Z71.89   ??? Counseling regarding advanced care planning and goals of care Z71.89   ??? Dyspnea R06.00   ??? Acute on chronic diastolic (congestive) heart failure (HCC) I50.33   ??? Obesity, morbid (HCC) E66.01   ??? 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 Intractable back pain M54.9   ??? Ambulatory dysfunction R26.2   ??? 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

## 2016-12-22 LAB — GLUCOSE, POC
Glucose (POC): 136 mg/dL — ABNORMAL HIGH (ref 65–100)
Glucose (POC): 110 mg/dL — ABNORMAL HIGH (ref 65–100)
Glucose (POC): 125 mg/dL — ABNORMAL HIGH (ref 65–100)

## 2016-12-22 LAB — CBC W/O DIFF
ABSOLUTE NRBC: 0 10*3/uL (ref 0.00–0.01)
HCT: 34.5 % — ABNORMAL LOW (ref 35.0–47.0)
HGB: 11.2 g/dL — ABNORMAL LOW (ref 11.5–16.0)
MCH: 27.7 PG (ref 26.0–34.0)
MCHC: 32.5 g/dL (ref 30.0–36.5)
MCV: 85.2 FL (ref 80.0–99.0)
MPV: 12.2 FL (ref 8.9–12.9)
NRBC: 0 PER 100 WBC
PLATELET: 299 10*3/uL (ref 150–400)
RBC: 4.05 M/uL (ref 3.80–5.20)
RDW: 17 % — ABNORMAL HIGH (ref 11.5–14.5)
WBC: 9 10*3/uL (ref 3.6–11.0)

## 2016-12-22 LAB — METABOLIC PANEL, BASIC
Anion gap: 8 mmol/L (ref 5–15)
BUN/Creatinine ratio: 41 — ABNORMAL HIGH (ref 12–20)
BUN: 42 MG/DL — ABNORMAL HIGH (ref 6–20)
CO2: 30 mmol/L (ref 21–32)
Calcium: 8 MG/DL — ABNORMAL LOW (ref 8.5–10.1)
Chloride: 98 mmol/L (ref 97–108)
Creatinine: 1.02 MG/DL (ref 0.55–1.02)
GFR est AA: >60 mL/min/{1.73_m2} (ref >60)
GFR est non-AA: 54 mL/min/{1.73_m2} — ABNORMAL LOW (ref >60)
Glucose: 107 mg/dL — ABNORMAL HIGH (ref 65–100)
Potassium: 4.4 mmol/L (ref 3.5–5.1)
Sodium: 136 mmol/L (ref 136–145)

## 2016-12-22 MED ORDER — METHOCARBAMOL 500 MG TAB
500 mg | ORAL_TABLET | Freq: Four times a day (QID) | ORAL | 1 refills | Status: DC
Start: 2016-12-22 — End: 2017-03-13

## 2016-12-22 MED ORDER — ACETAMINOPHEN-CODEINE 300 MG-30 MG TAB
300-30 mg | ORAL_TABLET | Freq: Every evening | ORAL | 0 refills | Status: AC | PRN
Start: 2016-12-22 — End: 2016-12-29

## 2016-12-22 MED ORDER — TIZANIDINE 4 MG TAB
4 mg | ORAL_TABLET | Freq: Three times a day (TID) | ORAL | 0 refills | Status: AC | PRN
Start: 2016-12-22 — End: 2016-12-29

## 2016-12-22 MED FILL — LISINOPRIL 20 MG TAB: 20 mg | ORAL | Qty: 2

## 2016-12-22 MED FILL — AMLODIPINE 5 MG TAB: 5 mg | ORAL | Qty: 2

## 2016-12-22 MED FILL — ASPIRIN 81 MG TAB, DELAYED RELEASE: 81 mg | ORAL | Qty: 1

## 2016-12-22 MED FILL — HYDROCODONE-ACETAMINOPHEN 10 MG-325 MG TAB: 10-325 mg | ORAL | Qty: 1

## 2016-12-22 MED FILL — ISOSORBIDE DINITRATE 20 MG TAB: 20 mg | ORAL | Qty: 2

## 2016-12-22 MED FILL — BD POSIFLUSH NORMAL SALINE 0.9 % INJECTION SYRINGE: INTRAMUSCULAR | Qty: 10

## 2016-12-22 MED FILL — PRAVASTATIN 10 MG TAB: 10 mg | ORAL | Qty: 2

## 2016-12-22 MED FILL — TOPROL XL 50 MG TABLET,EXTENDED RELEASE: 50 mg | ORAL | Qty: 4

## 2016-12-22 MED FILL — FUROSEMIDE 40 MG TAB: 40 mg | ORAL | Qty: 1

## 2016-12-22 MED FILL — HEALTHYLAX 17 GRAM ORAL POWDER PACKET: 17 gram | ORAL | Qty: 1

## 2016-12-22 MED FILL — CLONIDINE 0.1 MG TAB: 0.1 mg | ORAL | Qty: 1

## 2016-12-22 MED FILL — IPRATROPIUM-ALBUTEROL 2.5 MG-0.5 MG/3 ML NEB SOLUTION: 2.5 mg-0.5 mg/3 ml | RESPIRATORY_TRACT | Qty: 3

## 2016-12-22 MED FILL — LOVENOX 40 MG/0.4 ML SUBCUTANEOUS SYRINGE: 40 mg/0.4 mL | SUBCUTANEOUS | Qty: 0.4

## 2016-12-22 NOTE — Progress Notes (Signed)
ADULT PROTOCOL: JET AEROSOL  REASSESSMENT    Patient  Brittney Shelton     66 y.o.   female     12/22/2016  9:48 AM    Breath Sounds Pre Procedure: Right Breath Sounds: Diminished                               Left Breath Sounds: Diminished    Breath Sounds Post Procedure: Right Breath Sounds: Diminished                                 Left Breath Sounds: Diminished    Breathing pattern: Pre procedure Breathing Pattern: Regular          Post procedure Breathing Pattern: Regular    Heart Rate: Pre procedure Pulse: 88           Post procedure Pulse: 90    Resp Rate: Pre procedure Respirations: 16           Post procedure Respirations: 16            Cough: Pre procedure Cough: Non-productive               Post procedure Cough: Non-productive      Oxygen: O2 Device: Nasal cannula   Flow rate (L/min) 5 lpm     Changed: NO/ Baseline 4 lpm    SpO2: Pre procedure SpO2: 99 %   with oxygen              Post procedure SpO2: 90 %  with oxygen    Nebulizer Therapy: Current medications Aerosolized Medications: DuoNeb      Changed: NO    Smoking History: never    Problem List:   Patient Active Problem List   Diagnosis Code   ??? Well controlled type 2 diabetes mellitus (HCC) E11.9   ??? Hypercholesterolemia E78.00   ??? 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 COPD (chronic obstructive pulmonary disease) (HCC) J44.9   ??? Obesity E66.9   ??? Requires continuous at home supplemental oxygen Z99.81   ??? Dependence on supplemental oxygen Z99.81   ??? ACP (advance care planning) Z71.89   ??? Counseling regarding advanced care planning and goals of care Z71.89   ??? Dyspnea R06.00   ??? Acute on chronic diastolic (congestive) heart failure (HCC) I50.33   ??? Obesity, morbid (HCC) E66.01   ??? 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 Intractable back pain M54.9   ??? Ambulatory dysfunction R26.2   ??? 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

## 2016-12-22 NOTE — Progress Notes (Signed)
Bedside and Verbal shift change report given to Riki RuskJeremy RN (oncoming nurse) by Laure KidneyVictor RN (offgoing nurse). Report included the following information SBAR, Kardex, ED Summary, Procedure Summary, Intake/Output, MAR and Accordion.

## 2016-12-22 NOTE — Discharge Summary (Signed)
Physician Discharge Summary     Patient ID:  Brittney Shelton  [id number]  66 y.o.  May 22, 1951    Admit date: 12/17/2016    Discharge date and time: 12/22/2016    Admission Diagnoses: Intractable back pain  Ambulatory dysfunction  Intractable back pain    Discharge Diagnoses:    Active Problems:    Intractable back pain (12/17/2016)      Ambulatory dysfunction (12/18/2016)      Tricompartment osteoarthritis of left knee (12/19/2016)           Hospital Course:    66 y.o.  African American female who presents from home with intractable back pain, which she states is currently preventing her from getting out of bed. Per patient, she normally is functional at home where she lives alone. However, in the past 1.5-2 weeks she has been able to maintain basic activities of living without assistance. Given her body habitus, she has essentially been unable to even get out of bed at home. She also admits to constipation, stating that it's been over a week since her last bowel movement but denies n/v or abdominal distension.   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  Recently seen in ED, given Rx for zanaflex and norco as well as Rx for back exercises; however presented again 7/18 with no relief of symptoms. Has also tried tylenoll #3. 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  Admitted for management, case mgmnt/social work consulted  c/w norco 10-325 1tab PO q4hrs prn for mod pain  Ortho consulted -> saw pt on 7/19:advised to continue with pain management.  Approved for SNF/Rehab on 7/23  ??  Advanced osteoarthritis left knee causing ambulatory dysfunction.  Lidocaine and 80 mg Depo-Medrol injected into left knee by ortho on 12/19/16    Pt remained stable for discharge ??    PCP: Erenest RasherNau D Domah, MD       Condition of patient at discharge: stable    Discharge Exam:   General:   WD, WN. Alert, cooperative, no acute distress????  EENT:      EOMI. Anicteric sclerae. MMM  Resp:       CTA bilaterally, no wheezing or rales.  No accessory muscle use  CV:           Regular  rhythm,?? No edema  GI:            Soft, Non distended, Non tender. ??+Bowel sounds  Neurologic:??Alert and oriented X 3, normal speech,   Psych:????     Good insight.??Not anxious nor agitated  Skin:          No rashes.  No jaundice    Disposition: SNF    Patient Instructions:   Current Discharge Medication List      CONTINUE these medications which have CHANGED    Details   acetaminophen-codeine (TYLENOL #3) 300-30 mg per tablet Take 1 Tab by mouth nightly as needed for Pain for up to 7 days. Max Daily Amount: 1 Tab.  Qty: 30 Tab, Refills: 0    Associated Diagnoses: Chronic radicular pain of lower back      methocarbamol (ROBAXIN) 500 mg tablet Take 1 Tab by mouth four (4) times daily.  Qty: 20 Tab, Refills: 1    Associated Diagnoses: Chronic radicular pain of lower back  tiZANidine (ZANAFLEX) 4 mg tablet Take 1 Tab by mouth three (3) times daily as needed for up to 7 days. Prn muscle spasm  Qty: 30 Tab, Refills: 0         CONTINUE these medications which have NOT CHANGED    Details   aspirin delayed-release 81 mg tablet Take 81 mg by mouth daily.      HYDROcodone-acetaminophen (NORCO) 5-325 mg per tablet Take 1 Tab by mouth every four (4) hours as needed for Pain. Max Daily Amount: 6 Tabs.  Qty: 15 Tab, Refills: 0    Associated Diagnoses: Sciatica of left side      furosemide (LASIX) 40 mg tablet TAKE 1 TABLET BY MOUTH TWICE DAILY FOR SWELLING  Qty: 180 Tab, Refills: 0    Associated Diagnoses: Chronic acquired lymphedema      metoprolol succinate (TOPROL-XL) 200 mg XL tablet TAKE 1 TABLET BY MOUTH ONCE DAILY FOR HIGH BLOOD PRESSURE  Qty: 90 Tab, Refills: 0    Associated Diagnoses: Hypertension, uncontrolled      amLODIPine (NORVASC) 10 mg tablet TAKE 1 TABLET BY MOUTH DAILY  Qty: 90 Tab, Refills: 3     Associated Diagnoses: Hypertension goal BP (blood pressure) < 130/80      isosorbide dinitrate (ISORDIL) 30 mg tablet TAKE 1 TABLET BY MOUTH THREE TIMES DAILY  Qty: 270 Tab, Refills: 3    Associated Diagnoses: Hypertension, uncontrolled      lisinopril (PRINIVIL, ZESTRIL) 40 mg tablet TAKE 1 TABLET BY MOUTH ONCE DAILY  Qty: 90 Tab, Refills: 3    Associated Diagnoses: Hypertension, uncontrolled      cloNIDine HCl (CATAPRES) 0.1 mg tablet TAKE 1 TABLET BY MOUTH ONCE DAILY  Qty: 90 Tab, Refills: 0    Associated Diagnoses: Hypertension, uncontrolled      umeclidinium-vilanterol (ANORO ELLIPTA) 62.5-25 mcg/actuation inhaler Take 1 Puff by inhalation daily. Indications: BRONCHOSPASM PREVENTION WITH COPD  Qty: 1 Inhaler, Refills: 5    Associated Diagnoses: Mixed simple and mucopurulent chronic bronchitis (HCC)      naproxen sodium (ALEVE) 220 mg tablet Take 440 mg by mouth every twelve (12) hours as needed for Pain.      pravastatin (PRAVACHOL) 20 mg tablet TAKE 1 TABLET BY MOUTH ONCE DAILY AT NIGHT  Qty: 90 Tab, Refills: 5    Comments: **Patient requests 90 days supply**  Associated Diagnoses: Hypercholesterolemia      albuterol-ipratropium (DUO-NEB) 2.5 mg-0.5 mg/3 ml nebu Take 3 mL by inhalation three (3) times daily.      cholecalciferol, VITAMIN D3, (VITAMIN D3) 5,000 unit tab tablet Take 1 Tab by mouth daily.  Qty: 30 Tab, Refills: 5    Associated Diagnoses: Hypovitaminosis D         STOP taking these medications       aspirin 81 mg chewable tablet Comments:   Reason for Stopping:             Activity: Activity as tolerated  Diet: Regular Diet    Follow-up with Erenest Rasher, MD in 1-2weeks  Follow-up tests/labs  2wks    Approximate time spent in patient care on day of discharge: Greater than     Signed:  Burney Gauze, MD  12/22/2016  9:41 AM

## 2016-12-22 NOTE — Progress Notes (Signed)
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  Recently seen in ED, given Rx for zanaflex and norco as well as Rx for back exercises; however presented again 7/18 with no relief of symptoms. Has also tried tylenoll #3. 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  Admitted for management, case mgmnt/social work consulted  c/w norco 10-325 1tab PO q4hrs prn for mod pain  Fentanyl iv prn   Ortho consulted -> saw pt on 7/19:advised to continue with pain management.  Approved for SNF/Rehab on 7/23    Advanced osteoarthritis left knee causing ambulatory dysfunction.  Lidocaine and 80 mg Depo-Medrol injected into left knee by ortho on 12/19/16  PT/OT  ??  Constipation  Start colace and miralax  ??  Supermorbid obesity  -Counseled on lifestyle modifications  -currently limited by intractable pain, which she states prevents her from engaging PT or other physical activity  ??  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  Hx of chronic LE edema  PRN lasix at home  Hold for now, monitor and start if LE swelling emerges  ??  Code Status: Full  Surrogate Decision Maker: Husband  DVT Prophylaxis: SQ lovenox  GI Prophylaxis: not indicated  ??  Baseline: lives at home by herself, currently unable to get of of bed without and unable to participate in PT while in ED. On 4L NC O2 continuous at baseline    Disposition:rehab/SNF     Body mass index is 50.84 kg/(m^2).     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    HPI:  Pt seen and examined at the bedside  Feeling better today.   C/o R.Knee pain-wondering if Ortho can give her a shot to R.knee as well  Discussed with RN events overnight.    Review of Systems:  Symptom Y/N Comments  Symptom Y/N Comments   Fever/Chills n   Chest Pain n    Poor Appetite n   Edema n    Cough n   Abdominal Pain     Sputum    Joint Pain y Back pain,knee pain improving   SOB/DOE    Pruritis/Rash     Nausea/vomit    Tolerating PT/OT     Diarrhea n   Tolerating Diet     Constipation y   Other       Could NOT obtain due to:      Objective:     VITALS:   Last 24hrs VS reviewed since prior progress note. Most recent are:  Patient Vitals for the past 24 hrs:   Temp Pulse Resp BP SpO2   12/22/16 0833 - - - - 99 %   12/22/16 0348 98.1 ??F (36.7 ??C) 76 18 117/63 92 %   12/21/16 2002 98.4 ??F (36.9 ??C) 70 18 116/62 91 %   12/21/16 1943 - - - -  91 %   12/21/16 1535 97.9 ??F (36.6 ??C) 73 18 113/60 91 %   12/21/16 1253 98.4 ??F (36.9 ??C) 77 20 114/61 (!) 89 %       Intake/Output Summary (Last 24 hours) at 12/22/16 0905  Last data filed at 12/21/16 1913   Gross per 24 hour   Intake              300 ml   Output              300 ml   Net                0 ml        PHYSICAL EXAM:  General: WD, WN. Alert, cooperative, no acute distress????  EENT:  EOMI. Anicteric sclerae. MMM  Resp:  CTA bilaterally, no wheezing or rales.  No accessory muscle use  CV:  Regular  rhythm,?? No edema  GI:  Soft, Non distended, Non tender. ??+Bowel sounds  Neurologic:?? Alert and oriented X 3, normal speech,   Back:               Decreased tenderness to palpation lumbar area,less than previous days.  Extr:                 Tenderness decreased L knee   Psych:???? Good insight.??Not anxious nor agitated  Skin:  No rashes.  No jaundice    Reviewed most current lab test results and cultures  YES  Reviewed most current radiology test results   YES   Review and summation of old records today    NO  Reviewed patient's current orders and MAR    YES  PMH/SH reviewed - no change compared to H&P  ________________________________________________________________________  Care Plan discussed with:    Comments   Patient x    Family      RN x    Care Manager x    Consultant  x ortho                     Multidiciplinary team rounds were held today with case manager, nursing, pharmacist and Higher education careers adviser.  Patient's plan of care was discussed; medications were reviewed and discharge planning was addressed.     ________________________________________________________________________  Total NON critical care TIME: 25   Minutes    Total CRITICAL CARE TIME Spent:   Minutes non procedure based      Comments   >50% of visit spent in counseling and coordination of care x    ________________________________________________________________________  Burney Gauze, MD     Procedures: see electronic medical records for all procedures/Xrays and details which were not copied into this note but were reviewed prior to creation of Plan.      LABS:  I reviewed today's most current labs and imaging studies.  Pertinent labs include:  Recent Labs      12/22/16   0405   WBC  9.0   HGB  11.2*   HCT  34.5*   PLT  299     Recent Labs      12/22/16   0405   NA  136   K  4.4   CL  98   CO2  30   GLU  107*   BUN  42*   CREA  1.02   CA  8.0*       Signed: Burney Gauze, MD

## 2016-12-23 NOTE — Progress Notes (Signed)
Patient listed on discharge report on 12/23/16.?? [facility] for Intractable   back pain.?? [employer] @ [phone]?? to perform post hospital discharge assessment.?? Verified name and DOB with Victorino DikeJennifer, discharge planner, as identifiers.?? Provided introduction to self, and explanation of the NN role.??Brittney Shelton was unable to provide an estimated length of stay at this time. Unable to perform?? medication reconciliation with patient or medical staff.?? This Clinical research associatewriter requested MAR be faxed.This Clinical research associatewriter contact information provided for updates on patient. Will attempt to contact patient after discharge from skilled nursing facility    0840 on 12/24/16 this Clinical research associatewriter received MAR [facility]. Medication reconciliation completed.

## 2016-12-25 ENCOUNTER — Encounter: Attending: Internal Medicine | Primary: Internal Medicine

## 2016-12-25 NOTE — Progress Notes (Signed)
Community Care Team documentation for patient in Skilled Nursing Facility  Initial Follow Up       Patient was admitted to [facility] on 7/18 and [facility] 7/[facility].      Hospital Discharge diagnosis:  Intractable back pain     RRAT score: 36    Advance Care Planning:  Advance Directive on file.    PCP : Erenest RasherNau D Domah, MD  Nurse Navigator in PCP office: Kathlee NationsStephanie Johnson  Note routed to Nurse Navigator team.    SNF Attending:  Imogene Burnahir Khan, MD     Spoke with [name]  and team at SNF.  Ensured patient arrived to SNF safely with admission packet in order.  Discussed need for ortho follow up in 7-10 days. Pt is being skilled by PT/OT. Pt is max assist of 2-3 persons to sit on EOB. Dependent with mobility. Plan to discharge home alone. Pt's brother to stay with pt a few weeks if needed.      Community Care Team will follow up weekly with Skilled Nursing Facility until discharge.  Medications were not reconciled and general patient assessment was not completed during this Skilled Nursing Facility outreach.

## 2017-01-01 NOTE — Progress Notes (Signed)
Community Care Team Documentation for Patient in Skilled Nursing Facility  Subsequent Follow up     [facility]   [facility].    PCP : Erenest RasherNau D Domah, MD  Nurse Navigator in PCP office: Kathlee NationsStephanie Johnson    [name] [facility]  Ortho FU to be scheduled. Pt unable to tolerate transport at this time. PT/OT continue. Max assist with ADLs. Able to tolerate standing for 40 seconds. Barrier: Requiring 5L O2. Plan to discharge home alone. Extended LOS anticipated. Pt may need additional assistance at home. Pt's brother to stay with pt a few weeks if needed. SNF requested a [facility]. CCT sent message via in basket to request UAI.     Medications were not reconciled and general patient assessment was not completed during this skilled nursing facility outreach.

## 2017-01-06 ENCOUNTER — Emergency Department: Admit: 2017-01-06 | Payer: MEDICARE | Primary: Internal Medicine

## 2017-01-06 ENCOUNTER — Inpatient Hospital Stay
Admit: 2017-01-06 | Discharge: 2017-01-07 | Disposition: A | Discharge status: Skilled Nursing Facility | Payer: MEDICARE | Attending: Internal Medicine | Admitting: Internal Medicine

## 2017-01-06 DIAGNOSIS — T783XXA Angioneurotic edema, initial encounter: Principal | ICD-10-CM

## 2017-01-06 LAB — CBC WITH AUTOMATED DIFF
ABS. BASOPHILS: 0 10*3/uL (ref 0.0–0.1)
ABS. EOSINOPHILS: 0.3 10*3/uL (ref 0.0–0.4)
ABS. IMM. GRANS.: 0 10*3/uL (ref 0.00–0.04)
ABS. LYMPHOCYTES: 1.2 10*3/uL (ref 0.8–3.5)
ABS. MONOCYTES: 0.6 10*3/uL (ref 0.0–1.0)
ABS. NEUTROPHILS: 4.1 10*3/uL (ref 1.8–8.0)
ABSOLUTE NRBC: 0 10*3/uL (ref 0.00–0.01)
BASOPHILS: 1 % (ref 0–1)
EOSINOPHILS: 5 % (ref 0–7)
HCT: 40.3 % (ref 35.0–47.0)
HGB: 13 g/dL (ref 11.5–16.0)
IMMATURE GRANULOCYTES: 1 % — ABNORMAL HIGH (ref 0.0–0.5)
LYMPHOCYTES: 19 % (ref 12–49)
MCH: 26.8 PG (ref 26.0–34.0)
MCHC: 32.3 g/dL (ref 30.0–36.5)
MCV: 83.1 FL (ref 80.0–99.0)
MONOCYTES: 9 % (ref 5–13)
MPV: 10.9 FL (ref 8.9–12.9)
NEUTROPHILS: 65 % (ref 32–75)
NRBC: 0 PER 100 WBC
PLATELET: 329 10*3/uL (ref 150–400)
RBC: 4.85 M/uL (ref 3.80–5.20)
RDW: 16.7 % — ABNORMAL HIGH (ref 11.5–14.5)
WBC: 6.3 10*3/uL (ref 3.6–11.0)

## 2017-01-06 LAB — METABOLIC PANEL, COMPREHENSIVE
A-G Ratio: 0.5 — ABNORMAL LOW (ref 1.1–2.2)
ALT (SGPT): 8 U/L — ABNORMAL LOW (ref 12–78)
AST (SGOT): 9 U/L — ABNORMAL LOW (ref 15–37)
Albumin: 2.8 g/dL — ABNORMAL LOW (ref 3.5–5.0)
Alk. phosphatase: 72 U/L (ref 45–117)
Anion gap: 7 mmol/L (ref 5–15)
BUN/Creatinine ratio: 29 — ABNORMAL HIGH (ref 12–20)
BUN: 34 MG/DL — ABNORMAL HIGH (ref 6–20)
Bilirubin, total: 0.5 MG/DL (ref 0.2–1.0)
CO2: 32 mmol/L (ref 21–32)
Calcium: 8.9 MG/DL (ref 8.5–10.1)
Chloride: 96 mmol/L — ABNORMAL LOW (ref 97–108)
Creatinine: 1.17 MG/DL — ABNORMAL HIGH (ref 0.55–1.02)
GFR est AA: 56 mL/min/{1.73_m2} — ABNORMAL LOW (ref >60)
GFR est non-AA: 46 mL/min/{1.73_m2} — ABNORMAL LOW (ref >60)
Globulin: 5.6 g/dL — ABNORMAL HIGH (ref 2.0–4.0)
Glucose: 107 mg/dL — ABNORMAL HIGH (ref 65–100)
Potassium: 4.1 mmol/L (ref 3.5–5.1)
Protein, total: 8.4 g/dL — ABNORMAL HIGH (ref 6.4–8.2)
Sodium: 135 mmol/L — ABNORMAL LOW (ref 136–145)

## 2017-01-06 LAB — GLUCOSE, POC
Glucose (POC): 191 mg/dL — ABNORMAL HIGH (ref 65–100)
Glucose (POC): 164 mg/dL — ABNORMAL HIGH (ref 65–100)

## 2017-01-06 MED ORDER — CLONIDINE 0.1 MG TAB
0.1 mg | Freq: Every day | ORAL | Status: DC
Start: 2017-01-06 — End: 2017-01-07
  Administered 2017-01-07: 13:00:00 via ORAL

## 2017-01-06 MED ORDER — SODIUM CHLORIDE 0.9 % IJ SYRG
INTRAMUSCULAR | Status: DC | PRN
Start: 2017-01-06 — End: 2017-01-06

## 2017-01-06 MED ORDER — ENOXAPARIN 40 MG/0.4 ML SUB-Q SYRINGE
40 mg/0.4 mL | Freq: Two times a day (BID) | SUBCUTANEOUS | Status: DC
Start: 2017-01-06 — End: 2017-01-07
  Administered 2017-01-06 – 2017-01-07 (×3): via SUBCUTANEOUS

## 2017-01-06 MED ORDER — SODIUM CHLORIDE 0.9 % IV
INTRAVENOUS | Status: DC
Start: 2017-01-06 — End: 2017-01-07
  Administered 2017-01-06 – 2017-01-07 (×2): via INTRAVENOUS

## 2017-01-06 MED ORDER — DIPHENHYDRAMINE HCL 50 MG/ML IJ SOLN
50 mg/mL | Freq: Four times a day (QID) | INTRAMUSCULAR | Status: DC
Start: 2017-01-06 — End: 2017-01-06
  Administered 2017-01-06 (×2): via INTRAVENOUS

## 2017-01-06 MED ORDER — GLUCOSE 4 GRAM CHEWABLE TAB
4 gram | ORAL | Status: DC | PRN
Start: 2017-01-06 — End: 2017-01-07

## 2017-01-06 MED ORDER — ISOSORBIDE DINITRATE 20 MG TAB
20 mg | Freq: Three times a day (TID) | ORAL | Status: DC
Start: 2017-01-06 — End: 2017-01-07
  Administered 2017-01-07 (×2): via ORAL

## 2017-01-06 MED ORDER — METOPROLOL SUCCINATE SR 50 MG 24 HR TAB
50 mg | Freq: Every day | ORAL | Status: DC
Start: 2017-01-06 — End: 2017-01-07
  Administered 2017-01-07: 13:00:00 via ORAL

## 2017-01-06 MED ORDER — IPRATROPIUM-ALBUTEROL 2.5 MG-0.5 MG/3 ML NEB SOLUTION
2.5 mg-0.5 mg/3 ml | Freq: Four times a day (QID) | RESPIRATORY_TRACT | Status: DC
Start: 2017-01-06 — End: 2017-01-07
  Administered 2017-01-06 – 2017-01-07 (×6): via RESPIRATORY_TRACT

## 2017-01-06 MED ORDER — ONDANSETRON (PF) 4 MG/2 ML INJECTION
4 mg/2 mL | INTRAMUSCULAR | Status: DC | PRN
Start: 2017-01-06 — End: 2017-01-07

## 2017-01-06 MED ORDER — FAMOTIDINE 20 MG IN 50 ML NS IVPB
20 mg/50 mL | Freq: Three times a day (TID) | INTRAVENOUS | Status: DC
Start: 2017-01-06 — End: 2017-01-06
  Administered 2017-01-06: 10:00:00 via INTRAVENOUS

## 2017-01-06 MED ORDER — INSULIN LISPRO 100 UNIT/ML INJECTION
100 unit/mL | Freq: Four times a day (QID) | SUBCUTANEOUS | Status: DC
Start: 2017-01-06 — End: 2017-01-07
  Administered 2017-01-06: 12:00:00 via SUBCUTANEOUS

## 2017-01-06 MED ORDER — DEXTROSE 50% IN WATER (D50W) IV SYRG
INTRAVENOUS | Status: DC | PRN
Start: 2017-01-06 — End: 2017-01-07

## 2017-01-06 MED ORDER — UMECLIDINIUM 62.5 MCG-VILANTEROL 25 MCG/ACTUATION POWDR FOR INHALATION
Freq: Every day | RESPIRATORY_TRACT | Status: DC
Start: 2017-01-06 — End: 2017-01-07
  Administered 2017-01-06 – 2017-01-07 (×2): via RESPIRATORY_TRACT

## 2017-01-06 MED ORDER — PRAVASTATIN 10 MG TAB
10 mg | Freq: Every evening | ORAL | Status: DC
Start: 2017-01-06 — End: 2017-01-07
  Administered 2017-01-07: 03:00:00 via ORAL

## 2017-01-06 MED ORDER — FAMOTIDINE (PF) 20 MG/2 ML IV
20 mg/2 mL | Freq: Three times a day (TID) | INTRAVENOUS | Status: DC
Start: 2017-01-06 — End: 2017-01-06
  Administered 2017-01-06 (×2): via INTRAVENOUS

## 2017-01-06 MED ORDER — FAMOTIDINE (PF) 20 MG/2 ML IV
20 mg/2 mL | Freq: Two times a day (BID) | INTRAVENOUS | Status: DC
Start: 2017-01-06 — End: 2017-01-07
  Administered 2017-01-07 (×2): via INTRAVENOUS

## 2017-01-06 MED ORDER — CHOLECALCIFEROL (VITAMIN D3) 1,000 UNIT (25 MCG) TAB
Freq: Every day | ORAL | Status: DC
Start: 2017-01-06 — End: 2017-01-07
  Administered 2017-01-07: 12:00:00 via ORAL

## 2017-01-06 MED ORDER — HYDROCODONE-ACETAMINOPHEN 5 MG-325 MG TAB
5-325 mg | ORAL | Status: DC | PRN
Start: 2017-01-06 — End: 2017-01-07
  Administered 2017-01-07 (×2): via ORAL

## 2017-01-06 MED ORDER — FAMOTIDINE (PF) 20 MG/2 ML IV
20 mg/2 mL | INTRAVENOUS | Status: AC
Start: 2017-01-06 — End: 2017-01-06
  Administered 2017-01-06: 08:00:00 via INTRAVENOUS

## 2017-01-06 MED ORDER — ACETAMINOPHEN 325 MG TABLET
325 mg | ORAL | Status: DC | PRN
Start: 2017-01-06 — End: 2017-01-07

## 2017-01-06 MED ORDER — DIPHENHYDRAMINE HCL 50 MG/ML IJ SOLN
50 mg/mL | Freq: Four times a day (QID) | INTRAMUSCULAR | Status: DC
Start: 2017-01-06 — End: 2017-01-07
  Administered 2017-01-06 – 2017-01-07 (×4): via INTRAVENOUS

## 2017-01-06 MED ORDER — GLUCAGON 1 MG INJECTION
1 mg | INTRAMUSCULAR | Status: DC | PRN
Start: 2017-01-06 — End: 2017-01-07

## 2017-01-06 MED ORDER — METHYLPREDNISOLONE (PF) 125 MG/2 ML IJ SOLR
125 mg/2 mL | INTRAMUSCULAR | Status: AC
Start: 2017-01-06 — End: 2017-01-06
  Administered 2017-01-06: 08:00:00 via INTRAVENOUS

## 2017-01-06 MED ORDER — DOCUSATE SODIUM 100 MG CAP
100 mg | Freq: Two times a day (BID) | ORAL | Status: DC
Start: 2017-01-06 — End: 2017-01-07
  Administered 2017-01-07: 13:00:00 via ORAL

## 2017-01-06 MED ORDER — AMLODIPINE 5 MG TAB
5 mg | Freq: Every day | ORAL | Status: DC
Start: 2017-01-06 — End: 2017-01-07
  Administered 2017-01-07: 13:00:00 via ORAL

## 2017-01-06 MED ORDER — ENOXAPARIN 40 MG/0.4 ML SUB-Q SYRINGE
40 mg/0.4 mL | Freq: Every day | SUBCUTANEOUS | Status: DC
Start: 2017-01-06 — End: 2017-01-06

## 2017-01-06 MED ORDER — HYDRALAZINE 20 MG/ML IJ SOLN
20 mg/mL | Freq: Four times a day (QID) | INTRAMUSCULAR | Status: DC | PRN
Start: 2017-01-06 — End: 2017-01-07

## 2017-01-06 MED ORDER — NALOXONE 0.4 MG/ML INJECTION
0.4 mg/mL | INTRAMUSCULAR | Status: DC | PRN
Start: 2017-01-06 — End: 2017-01-07

## 2017-01-06 MED ORDER — SODIUM CHLORIDE 0.9 % IJ SYRG
INTRAMUSCULAR | Status: DC | PRN
Start: 2017-01-06 — End: 2017-01-07

## 2017-01-06 MED ORDER — SODIUM CHLORIDE 0.9 % IJ SYRG
Freq: Three times a day (TID) | INTRAMUSCULAR | Status: DC
Start: 2017-01-06 — End: 2017-01-06

## 2017-01-06 MED ORDER — SODIUM CHLORIDE 0.9 % IJ SYRG
Freq: Three times a day (TID) | INTRAMUSCULAR | Status: DC
Start: 2017-01-06 — End: 2017-01-07
  Administered 2017-01-06 – 2017-01-07 (×4): via INTRAVENOUS

## 2017-01-06 MED ORDER — ASPIRIN 81 MG TAB, DELAYED RELEASE
81 mg | Freq: Every day | ORAL | Status: DC
Start: 2017-01-06 — End: 2017-01-07
  Administered 2017-01-07: 13:00:00 via ORAL

## 2017-01-06 MED ORDER — IPRATROPIUM-ALBUTEROL 2.5 MG-0.5 MG/3 ML NEB SOLUTION
2.5 mg-0.5 mg/3 ml | RESPIRATORY_TRACT | Status: DC | PRN
Start: 2017-01-06 — End: 2017-01-07
  Administered 2017-01-06: 10:00:00 via RESPIRATORY_TRACT

## 2017-01-06 MED ORDER — METHOCARBAMOL 500 MG TAB
500 mg | Freq: Four times a day (QID) | ORAL | Status: DC | PRN
Start: 2017-01-06 — End: 2017-01-07
  Administered 2017-01-06: 10:00:00 via ORAL

## 2017-01-06 MED ORDER — DIPHENHYDRAMINE HCL 50 MG/ML IJ SOLN
50 mg/mL | INTRAMUSCULAR | Status: AC
Start: 2017-01-06 — End: 2017-01-06
  Administered 2017-01-06: 08:00:00 via INTRAVENOUS

## 2017-01-06 MED ORDER — METHYLPREDNISOLONE (PF) 40 MG/ML IJ SOLR
40 mg/mL | Freq: Three times a day (TID) | INTRAMUSCULAR | Status: DC
Start: 2017-01-06 — End: 2017-01-07
  Administered 2017-01-06 – 2017-01-07 (×4): via INTRAVENOUS

## 2017-01-06 MED FILL — ISOSORBIDE DINITRATE 10 MG TAB: 10 mg | ORAL | Qty: 1

## 2017-01-06 MED FILL — ANORO ELLIPTA 62.5 MCG-25 MCG/ACTUATION POWDER FOR INHALATION: RESPIRATORY_TRACT | Qty: 14

## 2017-01-06 MED FILL — TOPROL XL 50 MG TABLET,EXTENDED RELEASE: 50 mg | ORAL | Qty: 4

## 2017-01-06 MED FILL — AMLODIPINE 5 MG TAB: 5 mg | ORAL | Qty: 2

## 2017-01-06 MED FILL — FAMOTIDINE (PF) 20 MG/2 ML IV: 20 mg/2 mL | INTRAVENOUS | Qty: 2

## 2017-01-06 MED FILL — METHOCARBAMOL 500 MG TAB: 500 mg | ORAL | Qty: 1

## 2017-01-06 MED FILL — DIPHENHYDRAMINE HCL 50 MG/ML IJ SOLN: 50 mg/mL | INTRAMUSCULAR | Qty: 1

## 2017-01-06 MED FILL — IPRATROPIUM-ALBUTEROL 2.5 MG-0.5 MG/3 ML NEB SOLUTION: 2.5 mg-0.5 mg/3 ml | RESPIRATORY_TRACT | Qty: 3

## 2017-01-06 MED FILL — PRAVASTATIN 10 MG TAB: 10 mg | ORAL | Qty: 2

## 2017-01-06 MED FILL — CLONIDINE 0.1 MG TAB: 0.1 mg | ORAL | Qty: 1

## 2017-01-06 MED FILL — SOLU-MEDROL (PF) 125 MG/2 ML SOLUTION FOR INJECTION: 125 mg/2 mL | INTRAMUSCULAR | Qty: 2

## 2017-01-06 MED FILL — VITAMIN D3 25 MCG (1,000 UNIT) TABLET: 25 mcg (1,000 unit) | ORAL | Qty: 5

## 2017-01-06 MED FILL — DOK 100 MG CAPSULE: 100 mg | ORAL | Qty: 1

## 2017-01-06 MED FILL — ASPIRIN 81 MG TAB, DELAYED RELEASE: 81 mg | ORAL | Qty: 1

## 2017-01-06 MED FILL — LOVENOX 40 MG/0.4 ML SUBCUTANEOUS SYRINGE: 40 mg/0.4 mL | SUBCUTANEOUS | Qty: 0.4

## 2017-01-06 MED FILL — SOLU-MEDROL (PF) 40 MG/ML SOLUTION FOR INJECTION: 40 mg/mL | INTRAMUSCULAR | Qty: 2

## 2017-01-06 NOTE — Progress Notes (Signed)
Stephanie Johnson, NN notified of patient's readmission to [facility]/ Murray Guzzetta Manager of Care Management

## 2017-01-06 NOTE — ED Notes (Signed)
Assumed care of patient at this time via EMS from [facility]. Per staff, patient was checked on at 0100 today and was fine but when they checked on her later, they noticed her tongue was starting to swell. Pts medications, food, etc have not changed but she does take lisinopril. Per EMS patients 02 sats were 88% on 4L which is her baseline 02 use. Pt denies having any trouble breathing or swallowing but tongue is severely swollen.

## 2017-01-06 NOTE — ED Notes (Signed)
Patient placed on purewick, bathed and positioned for comfort.

## 2017-01-06 NOTE — ED Notes (Signed)
TRANSFER - OUT REPORT:    Verbal report given to Dollar GeneralKelly RN on Brittney LimesMary S Shelton  being transferred to Ortho for routine progression of care       Report consisted of patient???s Situation, Background, Assessment and   Recommendations(SBAR).     Information from the following report(s) SBAR, ED Summary, MAR, Recent Results and Cardiac Rhythm NSR was reviewed with the receiving nurse.    Lines:   Peripheral IV 01/06/17 Right Hand (Active)   Site Assessment Clean, dry, & intact 01/06/2017  3:58 AM   Phlebitis Assessment 0 01/06/2017  3:58 AM   Infiltration Assessment 0 01/06/2017  3:58 AM   Dressing Status Clean, dry, & intact 01/06/2017  3:58 AM   Dressing Type Transparent;Tape 01/06/2017  3:58 AM   Hub Color/Line Status Blue;Patent;Flushed 01/06/2017  3:58 AM       Peripheral IV 01/06/17 Left Antecubital (Active)   Site Assessment Clean, dry, & intact 01/06/2017  3:59 AM   Phlebitis Assessment 0 01/06/2017  3:59 AM   Infiltration Assessment 0 01/06/2017  3:59 AM   Dressing Status Clean, dry, & intact 01/06/2017  3:59 AM   Dressing Type Transparent;Tape 01/06/2017  3:59 AM   Hub Color/Line Status Pink;Patent;Flushed 01/06/2017  3:59 AM   Action Taken Blood drawn 01/06/2017  3:59 AM        Opportunity for questions and clarification was provided.      Patient transported with:   O2 @ 4 liters  Tech

## 2017-01-06 NOTE — Progress Notes (Addendum)
2023: TRANSFER - IN REPORT:    Verbal report received from Eric Shelton(name) on Brittney Shelton  being received from ED(unit) for routine progression of care      Report consisted of patient???s Situation, Background, Assessment and   Recommendations(SBAR).     Information from the following report(s) SBAR was reviewed with the receiving nurse.    Opportunity for questions and clarification was provided.      Assessment completed upon patient???s arrival to unit and care assumed.     Patient has not yet arrived. Note to follow.    2123: Patient arrived to room. Dual skin done with Brittney Shelton, and only abnormality is a bruise to the left arm. Call bell explained and bed alarm on.    2133: Paged Hospitalist per patient request about diet. Awaiting call back.    2150: Spoke with Dr. Skeet Latchiesla, and he said the Bedside Swallow Screen can be done, and if she passes, she can be advanced to clear liquids.    2228: Patient passed Dysphagia Screen without difficulty.    2256: Patient took pills without difficulty.

## 2017-01-06 NOTE — ED Provider Notes (Signed)
EMERGENCY DEPARTMENT HISTORY AND PHYSICAL EXAM          Date: 01/06/2017  Patient Name: Brittney Shelton    History of Presenting Illness     Chief Complaint   Patient presents with   ??? Tongue Swelling     Severe tongue swelling noticed by staaff at [facility] this AM. Pt normally on 4L and was 88% for EMS. Pt denies trouble breathing or swallowing       History Provided By: Patient and EMS    HPI: Brittney Shelton is a 66 y.o. female, pmhx COPD / HTN / DM / CHF, who presents via EMS as transferred from [facility] to the ED c/o gradually worsening tongue swelling with associated voice changes that began after 0100 this morning. Per EMS, nursing staff checked on pt at 0100 this morning at which time she was asymptomatic. EMS states when nursing checked in on the pt again her tongue was significantly swollen, prompting them to call 911. Pt denies any hx of similar sxs. She denies any recent medications for her current sxs. She notes adherence with her daily medications including her Lisinopril. Pt denies any recent new medications, soaps, lotions, detergents, or foods. She reports using 4L NC at baseline. EMS reports pt was hypoxic to 88% on her baseline 4L NC, but improved to 91% on 6L NC. Pt otherwise specifically denies any recent fever, chills, nausea, vomiting, diarrhea, abd pain, CP, SOB, lightheadedness, dizziness, numbness, weakness, tingling, BLE swelling, HA, heart palpitations, urinary sxs, changes in BM, changes in PO intake, melena, hematochezia, cough, or congestion.     PCP: Ermalene Postin, MD    Allergies: NKDA  PMHx: Significant for COPD, HTN, CHF, DM  PSHx: pt denies  Social Hx: -tobacco, -EtOH, -Illicit Drugs    There are no other complaints, changes, or physical findings at this time.     Current Facility-Administered Medications   Medication Dose Route Frequency Provider Last Rate Last Dose   ??? sodium chloride (NS) flush 5-10 mL  5-10 mL IntraVENous Q8H Myesha Stillion N O'Bier, MD        ??? sodium chloride (NS) flush 5-10 mL  5-10 mL IntraVENous PRN Aadith Raudenbush N O'Bier, MD         Current Outpatient Prescriptions   Medication Sig Dispense Refill   ??? methocarbamol (ROBAXIN) 500 mg tablet Take 1 Tab by mouth four (4) times daily. 20 Tab 1   ??? aspirin delayed-release 81 mg tablet Take 81 mg by mouth daily.     ??? HYDROcodone-acetaminophen (NORCO) 5-325 mg per tablet Take 1 Tab by mouth every four (4) hours as needed for Pain. Max Daily Amount: 6 Tabs. 15 Tab 0   ??? furosemide (LASIX) 40 mg tablet TAKE 1 TABLET BY MOUTH TWICE DAILY FOR SWELLING 180 Tab 0   ??? metoprolol succinate (TOPROL-XL) 200 mg XL tablet TAKE 1 TABLET BY MOUTH ONCE DAILY FOR HIGH BLOOD PRESSURE 90 Tab 0   ??? amLODIPine (NORVASC) 10 mg tablet TAKE 1 TABLET BY MOUTH DAILY 90 Tab 3   ??? isosorbide dinitrate (ISORDIL) 30 mg tablet TAKE 1 TABLET BY MOUTH THREE TIMES DAILY 270 Tab 3   ??? lisinopril (PRINIVIL, ZESTRIL) 40 mg tablet TAKE 1 TABLET BY MOUTH ONCE DAILY 90 Tab 3   ??? cloNIDine HCl (CATAPRES) 0.1 mg tablet TAKE 1 TABLET BY MOUTH ONCE DAILY 90 Tab 0   ??? umeclidinium-vilanterol (ANORO ELLIPTA) 62.5-25 mcg/actuation inhaler Take 1 Puff by inhalation daily.  Indications: BRONCHOSPASM PREVENTION WITH COPD 1 Inhaler 5   ??? naproxen sodium (ALEVE) 220 mg tablet Take 440 mg by mouth every twelve (12) hours as needed for Pain.     ??? cholecalciferol, VITAMIN D3, (VITAMIN D3) 5,000 unit tab tablet Take 1 Tab by mouth daily. 30 Tab 5   ??? pravastatin (PRAVACHOL) 20 mg tablet TAKE 1 TABLET BY MOUTH ONCE DAILY AT NIGHT 90 Tab 5   ??? albuterol-ipratropium (DUO-NEB) 2.5 mg-0.5 mg/3 ml nebu Take 3 mL by inhalation three (3) times daily.         Past History     Past Medical History:  Past Medical History:   Diagnosis Date   ??? Chronic obstructive pulmonary disease ([place])    ??? Congestive heart failure ([place])    ??? Diabetes ([place])    ??? Hypertension        Past Surgical History:  History reviewed. No pertinent surgical history.    Family History:  Family History    Problem Relation Age of Onset   ??? Diabetes Mother    ??? Arthritis-osteo Father    ??? COPD Sister    ??? Diabetes Brother    ??? Diabetes Brother        Social History:  Social History   Substance Use Topics   ??? Smoking status: Never Smoker   ??? Smokeless tobacco: Never Used   ??? Alcohol use No       Allergies:  Allergies   Allergen Reactions   ??? Lisinopril Angioedema         Review of Systems   Review of Systems   Constitutional: Negative for chills and fever.   HENT: Positive for voice change. Negative for congestion, ear pain, rhinorrhea and sore throat.         + tongue swelling   Respiratory: Negative for cough and shortness of breath.    Cardiovascular: Negative for chest pain, palpitations and leg swelling.   Gastrointestinal: Negative for abdominal pain, constipation, diarrhea, nausea and vomiting.        No melena  No hematochezia   Endocrine: Negative for polyuria.   Genitourinary: Negative for dysuria, frequency and hematuria.   Neurological: Negative for dizziness, weakness, light-headedness, numbness and headaches.        No tingling   All other systems reviewed and are negative.      Physical Exam   Physical Exam   Nursing note and vitals reviewed.    General appearance - obese, well appearing, and in no distress  Eyes - pupils equal and reactive, extraocular eye movements intact  ENT - mucous membranes moist, pharynx normal without lesions, significant tongue swelling, tolerating secretions  Neck - supple, no significant adenopathy; non-tender to palpation  Chest - rhonchorous breath sounds throughout, no wheezes or rales; non-tender to palpation, pt denies any difficulty breathing at this time  Heart - normal rate and regular rhythm, S1 and S2 normal, no murmurs noted  Abdomen - soft, nontender, nondistended, no masses or organomegaly  Musculoskeletal - no joint tenderness, deformity or swelling; normal ROM  Extremities - peripheral pulses normal, no pedal edema   Skin - normal coloration and turgor, no rashes  Neurological - alert, oriented x3, normal speech, no focal findings or movement disorder noted  Written by Donny Pique, ED Scribe, as dictated by Nathyn Luiz N O'Bier, MD      Diagnostic Study Results     Labs -     Recent Results (from the past 12  hour(s))   CBC WITH AUTOMATED DIFF    Collection Time: 01/06/17  4:02 AM   Result Value Ref Range    WBC 6.3 3.6 - 11.0 K/uL    RBC 4.85 3.80 - 5.20 M/uL    HGB 13.0 11.5 - 16.0 g/dL    HCT 40.3 35.0 - 47.0 %    MCV 83.1 80.0 - 99.0 FL    MCH 26.8 26.0 - 34.0 PG    MCHC 32.3 30.0 - 36.5 g/dL    RDW 16.7 (H) 11.5 - 14.5 %    PLATELET 329 150 - 400 K/uL    MPV 10.9 8.9 - 12.9 FL    NRBC 0.0 0 PER 100 WBC    ABSOLUTE NRBC 0.00 0.00 - 0.01 K/uL    NEUTROPHILS 65 32 - 75 %    LYMPHOCYTES 19 12 - 49 %    MONOCYTES 9 5 - 13 %    EOSINOPHILS 5 0 - 7 %    BASOPHILS 1 0 - 1 %    IMMATURE GRANULOCYTES 1 (H) 0.0 - 0.5 %    ABS. NEUTROPHILS 4.1 1.8 - 8.0 K/UL    ABS. LYMPHOCYTES 1.2 0.8 - 3.5 K/UL    ABS. MONOCYTES 0.6 0.0 - 1.0 K/UL    ABS. EOSINOPHILS 0.3 0.0 - 0.4 K/UL    ABS. BASOPHILS 0.0 0.0 - 0.1 K/UL    ABS. IMM. GRANS. 0.0 0.00 - 0.04 K/UL    DF AUTOMATED     METABOLIC PANEL, COMPREHENSIVE    Collection Time: 01/06/17  4:02 AM   Result Value Ref Range    Sodium 135 (L) 136 - 145 mmol/L    Potassium 4.1 3.5 - 5.1 mmol/L    Chloride 96 (L) 97 - 108 mmol/L    CO2 32 21 - 32 mmol/L    Anion gap 7 5 - 15 mmol/L    Glucose 107 (H) 65 - 100 mg/dL    BUN 34 (H) 6 - 20 MG/DL    Creatinine 1.17 (H) 0.55 - 1.02 MG/DL    BUN/Creatinine ratio 29 (H) 12 - 20      GFR est AA 56 (L) >60 ml/min/1.70m    GFR est non-AA 46 (L) >60 ml/min/1.757m   Calcium 8.9 8.5 - 10.1 MG/DL    Bilirubin, total 0.5 0.2 - 1.0 MG/DL    ALT (SGPT) 8 (L) 12 - 78 U/L    AST (SGOT) 9 (L) 15 - 37 U/L    Alk. phosphatase 72 45 - 117 U/L    Protein, total 8.4 (H) 6.4 - 8.2 g/dL    Albumin 2.8 (L) 3.5 - 5.0 g/dL    Globulin 5.6 (H) 2.0 - 4.0 g/dL     A-G Ratio 0.5 (L) 1.1 - 2.2         Radiologic Studies -     CXR Results  (Last 48 hours)    None            Medical Decision Making   I am the first provider for this patient.    I reviewed the vital signs, available nursing notes, past medical history, past surgical history, family history and social history.    Vital Signs-Reviewed the patient's vital signs.  Patient Vitals for the past 12 hrs:   Temp Pulse Resp BP SpO2   01/06/17 0500 - 67 14 137/62 94 %   01/06/17 0351 - - - (!) 156/98 -   01/06/17 0345 98.3 ??F (  36.8 ??C) 72 18 - 91 %       Pulse Oximetry Analysis - 95% on 6L NC     Cardiac Monitor:   Rate: 71bpm  Rhythm: Normal Sinus Rhythm       Records Reviewed: Nursing Notes, Old Medical Records, Previous electrocardiograms, Ambulance Run Sheet, Previous Radiology Studies and Previous Laboratory Studies    Provider Notes (Medical Decision Making):     DDx: ace-inhibitor induced angioedema, allergic reaction, medication reaction    ED Course:   Initial assessment performed. The patients presenting problems have been discussed, and they are in agreement with the care plan formulated and outlined with them.  I have encouraged them to ask questions as they arise throughout their visit.    CONSULT NOTE:   5:16 AM  Nathan Stallworth N O'Bier, MD spoke with Dr. Filbert Schilder,   Specialty: hospitalist  Discussed pt's hx, disposition, and available diagnostic and imaging results. Reviewed care plans. Consultant will evaluate pt for admission.   Written by Donny Pique, ED Scribe, as dictated by Joangel Vanosdol N O'Bier, MD.      Diagnosis     Clinical Impression:   1. Angioedema due to angiotensin converting enzyme inhibitor (ACE-I)          PLAN:  1. Admit to hospitalist    Disposition:    ADMISSION NOTE:  5:20 AM  Patient is being admitted to the hospital by Dr. Filbert Schilder.  The results of their tests and reasons for their admission have been discussed with them and/or available family.  They convey agreement and  understanding for the need to be admitted and for their admission diagnosis.   Written by Dorene Sorrow, ED Scribe, as dictated by Andru Genter N O'Bier, MD.            Attestations:    This note is prepared by Donny Pique, acting as Scribe for Aiya Keach N O'Bier, MD    Talbert Trembath N O'Bier, MD : The scribe's documentation has been prepared under my direction and personally reviewed by me in its entirety. I confirm that the note above accurately reflects all work, treatment, procedures, and medical decision making performed by me.    This note will not be viewable in [facility].

## 2017-01-06 NOTE — ED Notes (Signed)
Patient resting comfortably in stretcher with call bell in reach, side rails x2, respirations even and unlabored. No complaints voiced at this time.

## 2017-01-06 NOTE — Progress Notes (Signed)
Confirmed by CM that patient is currently at [facility] prior to admission for rehab following hospitalization 12/17/2016 to 12/22/2016 treated for mutli-level spinal degenerative disc disease and advanced left knee osteoarthritis pain control.  Sent to SNF for rehab.    Ezra SitesKathy Smith, RN, BSN, ACM  Case Manager - Medical Oncology  [phone]

## 2017-01-06 NOTE — Other (Deleted)
Patient is noted to have a BMI of  48.1 Please clarify if this patient is:       [facility] has issued a statement indicating that, "Individuals who are overweight, obese, or morbidly obese are at an increased risk for certain medical conditions when compared to persons of normal weight.  Therefore, these conditions are always clinically significant and reportable when documented by the provider."    =>Obesity (BMI of 30-39.9)  => Morbid Obesity  (BMI 40 or greater)  =>Overweight (BMI 25-29.9)  => Other weight status (specify  status)  => Unable to determine        Presentation: Ht 5\' 6"  Wt 296lbs BMI 48.1          Please clarify and document your clinical opinion in the progress notes and discharge summary, including the definitive and or presumptive diagnosis, (suspected or probable), related to the above clinical findings.  Please include clinical findings supporting your diagnosis.   Thank You     Irven Shellingawn Dykes, RN,BS,CCDS  CDMP     [phone]

## 2017-01-06 NOTE — ED Notes (Addendum)
Purewick became dislodged, new purewick placed and patient cleaned and positioned for comfort. Patient states it "still feels funny" when swallowing.

## 2017-01-06 NOTE — ED Notes (Addendum)
Patient speaking in full sentence, still somewhat slurred, but in no distress. Tongue swelling has gone down, however patient reports it still "feels funny" when she swallows. PO meds held at this time.

## 2017-01-06 NOTE — ED Notes (Signed)
Pt reports that tongue "feels a little better

## 2017-01-06 NOTE — H&P (Signed)
Hospitalist Admission Note    NAME: Brittney Shelton   DOB:  [birth year]   MRN:  [id number]     Date/Time:  01/06/2017 4:59 AM    Patient PCP: Ermalene Postin, MD  ______________________________________________________________________  Given the patient's current clinical presentation, I have a high level of concern for decompensation if discharged from the emergency department.  Complex decision making was performed, which includes reviewing the patient's available past medical records, laboratory results, and x-ray films.       My assessment of this patient's clinical condition and my plan of care is as follows.    Assessment / Plan:  Angioedema due to lisinopril   -admit to tele for close monitoring. Very high risk for further deterioration with h/o copd/chronic hypoxic respiratory failure   -cont IV steroids started in ED + scheduled benadryl x 3 doses then re evaluate + pepcid   -monitor airways closely , low threshold to intubate if deteriorating  -lisinopril was placed on allergy list    -follow cxray ( not done yet)   -keep NPO for now with meds only     Mild dehydration / hyponatremia   -BUN/Cr ration is elevated  -holding Lasix  -IVF  -monitor Na/Cr     COPD / chronic hypoxic respiratory failure  -stable, at baseline  -cont home O2 at 4-5 L  O2 to keep stas >90%  -cont jet nebs     DM type II borderline, controlled with diet  -BS 107   -c/w SS as needed ( may go up due to steroids) but also need to watch for hypoglycemia with NPO     HTN LE edema   -BP stable  -DC lisinopril   -on lasix at home for LE edema - holding while hydrating   -cont Norvasc / Imdur / clonidine / BB   -hydrazine prn     Chronic back pain due to multilevel degenerative disc disease 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  African American female who presents with above complaint. Pt is currently at the rehab at [facility]. Pt was hospitalized at [facility] 7/18-7/23 for intractable back pain. She was noticed by stuff with tongue swelling. She was last seen normal ~ 1 am this morning. Pt is on lisinopril. EMS reported O2 sat was at 88% on 4L. Pt is on chronic O2 at 4-5 L. No h.o new medications or new food. Pt denies fever/chills/dyspnea/CP.       We were asked to admit for work up and evaluation of the above problems.     Past Medical History:   Diagnosis Date   ??? Chronic obstructive pulmonary disease ([place])    ??? Congestive heart failure ([place])    ??? Diabetes ([place])    ??? Hypertension         History reviewed. No pertinent surgical history.    Social History   Substance Use Topics   ???  Smoking status: Never Smoker   ??? Smokeless tobacco: Never Used   ??? Alcohol use No        Family History   Problem Relation Age of Onset   ??? Diabetes Mother    ??? Arthritis-osteo Father    ??? COPD Sister    ??? Diabetes Brother    ??? Diabetes Brother      No Known Allergies     Prior to Admission medications    Medication Sig Start Date End Date Taking? Authorizing Provider   methocarbamol (ROBAXIN) 500 mg tablet Take 1 Tab by mouth four (4) times daily. 12/22/16   Lynnell Chad, MD   aspirin delayed-release 81 mg tablet Take 81 mg by mouth daily.    Historical Provider   HYDROcodone-acetaminophen (NORCO) 5-325 mg per tablet Take 1 Tab by mouth every four (4) hours as needed for Pain. Max Daily Amount: 6 Tabs. 12/13/16   Allen Derry, MD   furosemide (LASIX) 40 mg tablet TAKE 1 TABLET BY MOUTH TWICE DAILY FOR SWELLING 11/12/16   Ermalene Postin, MD   metoprolol succinate (TOPROL-XL) 200 mg XL tablet TAKE 1 TABLET BY MOUTH  ONCE DAILY FOR HIGH BLOOD PRESSURE 11/12/16   Ermalene Postin, MD   amLODIPine (NORVASC) 10 mg tablet TAKE 1 TABLET BY MOUTH DAILY 08/06/16   Ermalene Postin, MD   isosorbide dinitrate (ISORDIL) 30 mg tablet TAKE 1 TABLET BY MOUTH THREE TIMES DAILY 05/13/16   Ermalene Postin, MD   lisinopril (PRINIVIL, ZESTRIL) 40 mg tablet TAKE 1 TABLET BY MOUTH ONCE DAILY 05/13/16   Ermalene Postin, MD   cloNIDine HCl (CATAPRES) 0.1 mg tablet TAKE 1 TABLET BY MOUTH ONCE DAILY 02/16/16   Ermalene Postin, MD   umeclidinium-vilanterol (ANORO ELLIPTA) 62.5-25 mcg/actuation inhaler Take 1 Puff by inhalation daily. Indications: BRONCHOSPASM PREVENTION WITH COPD 01/30/16   Ermalene Postin, MD   naproxen sodium (ALEVE) 220 mg tablet Take 440 mg by mouth every twelve (12) hours as needed for Pain.    Historical Provider   cholecalciferol, VITAMIN D3, (VITAMIN D3) 5,000 unit tab tablet Take 1 Tab by mouth daily. 12/11/15   Ermalene Postin, MD   pravastatin (PRAVACHOL) 20 mg tablet TAKE 1 TABLET BY MOUTH ONCE DAILY AT NIGHT 11/26/15   Nau D Domah, MD   albuterol-ipratropium (DUO-NEB) 2.5 mg-0.5 mg/3 ml nebu Take 3 mL by inhalation three (3) times daily.    Historical Provider       REVIEW OF SYSTEMS:     I am not able to complete the review of systems because:   The patient is intubated and sedated    The patient has altered mental status due to his acute medical problems    The patient has baseline aphasia from prior stroke(s)    The patient has baseline dementia and is not reliable historian    The patient is in acute medical distress and unable to provide information           Total of 12 systems reviewed as follows:       POSITIVE= underlined text  Negative = text not underlined  General:  fever, chills, sweats, generalized weakness, weight loss/gain,      loss of appetite   Eyes:    blurred vision, eye pain, loss of vision, double vision  ENT:    rhinorrhea, pharyngitis , tongue swelling / voice changed    Respiratory:   cough, sputum production, SOB, DOE, wheezing, pleuritic  pain   Cardiology:   chest pain, palpitations, orthopnea, PND, edema, syncope   Gastrointestinal:  abdominal pain , N/V, diarrhea, dysphagia, constipation, bleeding   Genitourinary:  frequency, urgency, dysuria, hematuria, incontinence   Muskuloskeletal :  arthralgia, myalgia, back pain  Hematology:  easy bruising, nose or gum bleeding, lymphadenopathy   Dermatological: rash, ulceration, pruritis, color change / jaundice  Endocrine:   hot flashes or polydipsia   Neurological:  headache, dizziness, confusion, focal weakness, paresthesia,     Speech difficulties, memory loss, gait difficulty  Psychological: Feelings of anxiety, depression, agitation    Objective:   VITALS:    Visit Vitals   ??? BP (!) 156/98   ??? Pulse 72   ??? Temp 98.3 ??F (36.8 ??C)   ??? Resp 18   ??? Ht '5\' 6"'  (1.676 m)   ??? Wt 135.2 kg (298 lb)   ??? SpO2 91%   ??? BMI 48.1 kg/m2       PHYSICAL EXAM:    General:    Alert, cooperative, no distress, appears stated age.     HEENT: Atraumatic, anicteric sclerae, pink conjunctivae     No oral ulcers, mucosa moist, throat clear, dentition fair                         + tongue swelling   Neck:  Supple, symmetrical,  thyroid: non tender  Lungs:   Clear to auscultation bilaterally.  No Wheezing or Rhonchi. No rales.  Chest wall:  No tenderness  No Accessory muscle use.  Heart:   Regular  rhythm,  No  murmur   No edema  Abdomen:   Soft, non-tender. Not distended.  Bowel sounds normal  Extremities: No cyanosis.  No clubbing,      Skin turgor normal, Capillary refill normal, Radial dial pulse 2+  Skin:     Not pale.  Not Jaundiced  No rashes   Psych:  Good insight.  Not depressed.  Not anxious or agitated.  Neurologic: EOMs intact. No facial asymmetry. No aphasia or slurred speech. Symmetrical strength, Sensation grossly intact. Alert and oriented X 4.     _______________________________________________________________________  Care Plan discussed with:     Comments   Patient y    Family      RN y    Scientist, forensic:  y    _______________________________________________________________________  Expected  Disposition:   Home with Family y   HH/PT/OT/RN    SNF/LTC    SAHR    ________________________________________________________________________  TOTAL TIME:  52   Minutes    Critical Care Provided     Minutes non procedure based      Comments     Reviewed previous records   >50% of visit spent in counseling and coordination of care  Discussion with patient and/or family and questions answered       ________________________________________________________________________  Signed: Marland Mcalpine, MD    Procedures: see electronic medical records for all procedures/Xrays and details which were not copied into this note but were reviewed prior to creation of Plan.    LAB DATA REVIEWED:    Recent Results (from the past 24 hour(s))   CBC WITH AUTOMATED DIFF    Collection Time: 01/06/17  4:02 AM   Result Value Ref Range    WBC 6.3 3.6 - 11.0 K/uL    RBC  4.85 3.80 - 5.20 M/uL    HGB 13.0 11.5 - 16.0 g/dL    HCT 40.3 35.0 - 47.0 %    MCV 83.1 80.0 - 99.0 FL    MCH 26.8 26.0 - 34.0 PG    MCHC 32.3 30.0 - 36.5 g/dL    RDW 16.7 (H) 11.5 - 14.5 %    PLATELET 329 150 - 400 K/uL    MPV 10.9 8.9 - 12.9 FL    NRBC 0.0 0 PER 100 WBC    ABSOLUTE NRBC 0.00 0.00 - 0.01 K/uL    NEUTROPHILS 65 32 - 75 %    LYMPHOCYTES 19 12 - 49 %    MONOCYTES 9 5 - 13 %    EOSINOPHILS 5 0 - 7 %    BASOPHILS 1 0 - 1 %    IMMATURE GRANULOCYTES 1 (H) 0.0 - 0.5 %    ABS. NEUTROPHILS 4.1 1.8 - 8.0 K/UL    ABS. LYMPHOCYTES 1.2 0.8 - 3.5 K/UL    ABS. MONOCYTES 0.6 0.0 - 1.0 K/UL    ABS. EOSINOPHILS 0.3 0.0 - 0.4 K/UL    ABS. BASOPHILS 0.0 0.0 - 0.1 K/UL    ABS. IMM. GRANS. 0.0 0.00 - 0.04 K/UL    DF AUTOMATED     METABOLIC PANEL, COMPREHENSIVE    Collection Time: 01/06/17  4:02 AM   Result Value Ref Range    Sodium 135 (L) 136 - 145 mmol/L    Potassium 4.1 3.5 - 5.1 mmol/L     Chloride 96 (L) 97 - 108 mmol/L    CO2 32 21 - 32 mmol/L    Anion gap 7 5 - 15 mmol/L    Glucose 107 (H) 65 - 100 mg/dL    BUN 34 (H) 6 - 20 MG/DL    Creatinine 1.17 (H) 0.55 - 1.02 MG/DL    BUN/Creatinine ratio 29 (H) 12 - 20      GFR est AA 56 (L) >60 ml/min/1.27m    GFR est non-AA 46 (L) >60 ml/min/1.746m   Calcium 8.9 8.5 - 10.1 MG/DL    Bilirubin, total 0.5 0.2 - 1.0 MG/DL    ALT (SGPT) 8 (L) 12 - 78 U/L    AST (SGOT) 9 (L) 15 - 37 U/L    Alk. phosphatase 72 45 - 117 U/L    Protein, total 8.4 (H) 6.4 - 8.2 g/dL    Albumin 2.8 (L) 3.5 - 5.0 g/dL    Globulin 5.6 (H) 2.0 - 4.0 g/dL    A-G Ratio 0.5 (L) 1.1 - 2.2

## 2017-01-06 NOTE — Progress Notes (Signed)
Hospitalist Progress Note    NAME: Brittney Shelton   DOB:  [birth year]   MRN:  [id number]       Assessment / Plan:  Angioedema due to lisinopril   -admit to tele for close monitoring. Very high risk for further deterioration with h/o copd/chronic hypoxic respiratory failure   -angioedema improving.  Right face/lip swelling resolved.  Still has mild swelling of tongue with change in vocal quality.  Left upper and lower lip still swollen.   --discussed with patient to mention lisinopril as an allergy from here on.  Lisinopril was placed on allergy list    --continue IV solumedrol 80mg  q8h.    --continue benadryl q6h  --continue pepcid; change from q8h to q12h  --cxray normal   --will ask speech therapy to eval patient.  She was able to take methocarbamol without difficulty but has not been given her other meds order.     ??  Mild dehydration / hyponatremia   -BUN/Cr ration is elevated  -holding Lasix  -IVF  -monitor Na/Cr   ??  COPD / chronic hypoxic respiratory failure  -stable, at baseline  -cont home O2 at 4-5 L  O2 to keep stas >90%  -cont jet nebs   ??  DM type II borderline, controlled with diet  -BS 107   -c/w low dose SS as needed ( may go up due to steroids) but also need to watch for hypoglycemia with NPO   ??  HTN LE edema   -BP stable  -DC lisinopril   -on lasix at home for LE edema - holding while hydrating   -cont Norvasc / Imdur / clonidine / BB   -hydrazine prn   ??  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  Hyperlipidemia, cont statin   ????  Code Status: Full code   Surrogate Decision Maker: husband   ??  DVT Prophylaxis: Lovenox   GI Prophylaxis: not indicated  ??  Baseline: Home O2 4-5 L       40 or above Morbid obesity / Body mass index is 48.1 kg/(m^2).    Recommended Disposition:back to SNF/LTC     Subjective:      Chief Complaint / Reason for Physician Visit  "tongue swelling".  Discussed with RN.  Patient swallowed methacarbamol fine but technically did  not pass dysphagia screen because of change in vocal quality so could not proceed with screen.    Report swelling on right face resolved and improved on left but not back to normal.    Objective:     VITALS:   Last 24hrs VS reviewed since prior progress note. Most recent are:  Patient Vitals for the past 24 hrs:   Temp Pulse Resp BP SpO2   01/06/17 1500 - 88 20 155/75 92 %   01/06/17 1400 - 91 20 164/76 92 %   01/06/17 1351 - 91 20 - 92 %   01/06/17 1339 - 90 23 - 94 %   01/06/17 1300 - 86 20 162/83 93 %   01/06/17 1200 - 85 21 138/88 92 %   01/06/17 1100 - 83 22 154/83 90 %   01/06/17 1000 - 85 23 145/80 92 %   01/06/17 0930 - 84 20 166/84 91 %   01/06/17 0915 - 81 20 150/71 94 %  01/06/17 0900 - 79 22 144/74 91 %   01/06/17 0700 98.5 ??F (36.9 ??C) 73 17 145/64 90 %   01/06/17 0500 - 67 14 137/62 94 %   01/06/17 0351 - - - (!) 156/98 -   01/06/17 0345 98.3 ??F (36.8 ??C) 72 18 - 91 %     No intake or output data in the 24 hours ending 01/06/17 1554     PHYSICAL EXAM:  General: Obese female, WD, WN. Alert, cooperative, no acute distress????  EENT:  EOMI. Mild tongue swelling.  Moderate swelling left lower lip and mild swelling of left upper lip.    Anicteric sclerae. MMM  Resp:  CTA bilaterally, no wheezing or rales.  No accessory muscle use  CV:  Regular  rhythm,?? No edema  GI:  Soft, Non distended, Non tender. ??+Bowel sounds  Neurologic:?? Alert and oriented X 3, normal speech,   Psych:???? Good insight.??Not anxious nor agitated  Skin:  No rashes.  No jaundice    Reviewed most current lab test results and cultures  YES  Reviewed most current radiology test results   YES  Review and summation of old records today    NO  Reviewed patient's current orders and MAR    YES  PMH/SH reviewed - no change compared to H&P   ________________________________________________________________________  Care Plan discussed with:    Comments   Patient y    Family      RN y [place]   AcupuncturistCare Manager     Consultant                        Multidiciplinary team rounds were held today with case Production designer, theatre/television/filmmanager, nursing, pharmacist and Higher education careers adviserclinical coordinator.  Patient's plan of care was discussed; medications were reviewed and discharge planning was addressed.     ________________________________________________________________________  Total NON critical care TIME:  20  Minutes  ________________________________________________________________________  Norton PastelKari H Yamileth Hayse, MD     Procedures: see electronic medical records for all procedures/Xrays and details which were not copied into this note but were reviewed prior to creation of Plan.      LABS:  I reviewed today's most current labs and imaging studies.  Pertinent labs include:  Recent Labs      01/06/17   0402   WBC  6.3   HGB  13.0   HCT  40.3   PLT  329     Recent Labs      01/06/17   0402   NA  135*   K  4.1   CL  96*   CO2  32   GLU  107*   BUN  34*   CREA  1.17*   CA  8.9   ALB  2.8*   TBILI  0.5   SGOT  9*   ALT  8*       Signed: Norton PastelKari H Rion Schnitzer, MD

## 2017-01-06 NOTE — Progress Notes (Signed)
Pharmacy Clarification of the Prior to Admission Medication Regimen Retrospective to the Admission Medication Reconciliation    Patient was [facility], [facility], with a current med list. Pharmacy called [facility], [phone], and spoke with Lupita Leashonna, LPN, who was able to verify the patient's last administered doses.     Information Obtained From: Rx Query, Transfer Papers    Recommendations/Findings:   The following amendments were made to the patient's active medication list on file at [facility]:     1) Additions:   ? tiZANidine (ZANAFLEX) 4 mg capsule  ? acetaminophen-codeine (TYLENOL-CODEINE #3) 300-30 mg per tablet      2) Removals: NONE      3) Changes: None      4) Pertinent Pharmacy Findings:  ? tiZANidine (ZANAFLEX) 4 mg capsule: not part of patient's current PRN regimen per snf     PTA medication list was corrected to the following:     Prior to Admission Medications   Prescriptions Last Dose Informant Patient Reported? Taking?   HYDROcodone-acetaminophen (NORCO) 5-325 mg per tablet 01/05/2017 at 1802 Transfer Papers No Yes   Sig: Take 1 Tab by mouth every four (4) hours as needed for Pain. Max Daily Amount: 6 Tabs.   acetaminophen-codeine (TYLENOL-CODEINE #3) 300-30 mg per tablet 01/02/2017 at 1200 Transfer Papers Yes Yes   Sig: Take 1 Tab by mouth daily as needed for Pain. Indications: Pain   albuterol-ipratropium (DUO-NEB) 2.5 mg-0.5 mg/3 ml nebu 01/05/2017 at 2100 Transfer Papers Yes Yes   Sig: Take 3 mL by inhalation three (3) times daily.   amLODIPine (NORVASC) 10 mg tablet 01/05/2017 at 0900 Transfer Papers No Yes   Sig: TAKE 1 TABLET BY MOUTH DAILY   aspirin delayed-release 81 mg tablet 01/05/2017 at 0900 Transfer Papers Yes Yes   Sig: Take 81 mg by mouth daily.   cholecalciferol, VITAMIN D3, (VITAMIN D3) 5,000 unit tab tablet 01/05/2017 at 0900 Transfer Papers No Yes   Sig: Take 1 Tab by mouth daily.   cloNIDine HCl (CATAPRES) 0.1 mg tablet 01/05/2017 at 0900 Transfer Papers No  Yes   Sig: TAKE 1 TABLET BY MOUTH ONCE DAILY   furosemide (LASIX) 40 mg tablet 01/05/2017 at 2100 Transfer Papers No Yes   Sig: TAKE 1 TABLET BY MOUTH TWICE DAILY FOR SWELLING   isosorbide dinitrate (ISORDIL) 30 mg tablet 01/05/2017 at 2100 Transfer Papers No Yes   Sig: TAKE 1 TABLET BY MOUTH THREE TIMES DAILY   lisinopril (PRINIVIL, ZESTRIL) 40 mg tablet 01/05/2017 at 0900 Transfer Papers No Yes   Sig: TAKE 1 TABLET BY MOUTH ONCE DAILY   methocarbamol (ROBAXIN) 500 mg tablet 01/05/2017 at 2100 Transfer Papers No Yes   Sig: Take 1 Tab by mouth four (4) times daily.   metoprolol succinate (TOPROL-XL) 200 mg XL tablet 01/05/2017 at 0900 Transfer Papers No Yes   Sig: TAKE 1 TABLET BY MOUTH ONCE DAILY FOR HIGH BLOOD PRESSURE   naproxen sodium (ALEVE) 220 mg tablet 12/06/2016 at Unknown time Transfer Papers Yes Yes   Sig: Take 440 mg by mouth every twelve (12) hours as needed for Pain.   pravastatin (PRAVACHOL) 20 mg tablet 01/05/2017 at 1800 Transfer Papers No Yes   Sig: TAKE 1 TABLET BY MOUTH ONCE DAILY AT NIGHT   tiZANidine (ZANAFLEX) 4 mg capsule Not Taking at Unknown time Transfer Papers Yes No   Sig: Take 4 mg by mouth three (3) times daily as needed (muscle spasms).   umeclidinium-vilanterol (ANORO ELLIPTA)  62.5-25 mcg/actuation inhaler 01/05/2017 at 0900 Transfer Papers No Yes   Sig: Take 1 Puff by inhalation daily. Indications: BRONCHOSPASM PREVENTION WITH COPD      Facility-Administered Medications: None          Thank you,  Arbutus Ped  PharmD. Candidate 2019

## 2017-01-06 NOTE — ED Notes (Signed)
PT/OT at bedside.

## 2017-01-06 NOTE — Progress Notes (Signed)
Reason for Admission: Angioedema                  RRAT Score: 33             Resources/supports as identified by patient/family:  Pt states she has a strong family support system                   Top Challenges facing patient (as identified by patient/family and CM):                       Finances/Medication cost?  Pt states no problems financially or accessing medications               Transportation?  Pt does not drive. Pt states her sister in law and brother will drive her where she needs to be as needed.               Support system or lack thereof?  Pt states she has a strong family support system but they are not always there when needed                      Living arrangements? Pt lives by herself but does have assistance from her brother            Self-care/ADLs/Cognition?  Pt states she is able to complete basic ADLS without assistance and is oriented x3.           Current Advanced Directive/Advance Care Plan:  Pt has advanced directives on file but is still a full code                           Plan for utilizing home health:  Pt has had HH in the past but is unsure of the name                         Likelihood of readmission:  High per pt acuity and co morbidities                  Transition of Care Plan:                  Pt is a 66 year old, african [place] female, admitted with agiodema. Pt was alert and oriented x3 and confirmed address, emergency contact and PCP. Pt states [facility]. When at home pt lives alone in a one level home with 3 steps to enter. Pt states her brother comes by for a couple days at a time to assist. Pt does not drive at baseline and does have a cane and walker. Pt has had HH in the past (about 4 years ago) but does not recall the name. As stated, [facility]. Pt's preferred pharmacy is the [facility] off of [address] [phone]. Pt's family drives her to appointments and if pt returns to SNF she will go via ambulance.     CM will continue to follow pt for discharge planning as needed.     Hyman BowerMary Guarino, MSW, [facility]   [facility] Care Manager   [phone]

## 2017-01-06 NOTE — Progress Notes (Signed)
Problem: Mobility Impaired (Adult and Pediatric)  Goal: *Acute Goals and Plan of Care (Insert Text)  Physical Therapy Goals  Initiated 01/06/2017  1.  Patient will move from supine to sit and sit to supine , scoot up and down and roll side to side in bed with moderate assistance  within 7 day(s).    2.  Patient will transfer from bed to chair and chair to bed with moderate assistance  using the least restrictive device within 7 day(s).  3.  Patient will perform sit to stand with moderate assistance  within 7 day(s).  4.  Patient will ambulate with moderate assistance  for 3 feet to turn to chair with the least restrictive device within 7 day(s).     physical Therapy EVALUATION  Patient: Brittney Shelton [phone] y.o. female)  Date: 01/06/2017  Primary Diagnosis: Angioedema        Precautions: will need mobility team        ASSESSMENT :  Based on the objective data described below, the patient presents with moderate limitations in functional mobility, gait, and activity tolerance. Cleared for session by RN.  Pt is currently at [facility] for rehab s/p last hospitalization and comes back to [facility] with tongue swelling. Prior to last hospitalization pt was able to amb with SPC or RW, lived in her apt with brother coming intermittently. She states she has progressed to being able to amb 12 steps in the parallel bars at rehab.  Currently, pt is max A of 2 to come to EOB. Her sitting balance is good. She is able to scoot at EOB wth min A. She transfers sit to stand with max A of 2 and is able to maintain with max A of 2 for ~ 12 sec for adjust and change of bed pads. She requires max A of 2 to return to supine. Call bell in reach, nurse aware.  Pt will benefit from return to rehab at [facility]. She states her goal is to walk out of there. She is motivated to progress.    Patient will benefit from skilled intervention to address the above impairments.  Patient???s rehabilitation potential is considered to be Good   Factors which may influence rehabilitation potential include:   []          None noted  []          Mental ability/status  [x]          Medical condition  []          Home/family situation and support systems  []          Safety awareness  []          Pain tolerance/management  []          Other:      PLAN :  Recommendations and Planned Interventions:  [x]            Bed Mobility Training             []     Neuromuscular Re-Education  [x]            Transfer Training                   []     Orthotic/Prosthetic Training  [x]            Gait Training                         []     Modalities  [x]   Therapeutic Exercises           []     Edema Management/Control  [x]            Therapeutic Activities            [x]     Patient and Family Training/Education  []            Other (comment):    Frequency/Duration: Patient will be followed by physical therapy  4 times a week to address goals.  Discharge Recommendations: Skilled Nursing Facility rehab  Further Equipment Recommendations for Discharge: TBD     SUBJECTIVE:   Patient stated ???I will try anything.???    OBJECTIVE DATA SUMMARY:   HISTORY:    Past Medical History:   Diagnosis Date   ??? Chronic obstructive pulmonary disease (HCC)    ??? Congestive heart failure (HCC)    ??? Diabetes (HCC)    ??? Hypertension    History reviewed. No pertinent surgical history.  Prior Level of Function/Home Situation: Pt is currently at [facility] for rehab s/p last hospitalization and comes back to [facility] with tongue swelling. Prior to last hospitalization pt was able to amb with SPC or RW, lived in her apt with brother coming intermittently. She states she has progressed to being able to amb 12 steps in the parallel bars at rehab.      Home Situation  Home Environment: Skilled nursing facility  Care Facility Name: [facility]  # Steps to Enter: 0  One/Two Story Residence: One story  Living Alone: No  Support Systems: Warehouse manager / faith community, Family member(s), Friends \\  neighbors, Skilled nursing facility  Patient Expects to be Discharged to:: Skilled nursing facility  Current DME Used/Available at Home: Cane, straight, Oxygen, portable, Walker, rolling    EXAMINATION/PRESENTATION/DECISION MAKING:   Critical Behavior:  Neurologic State: Alert, Appropriate for age  Orientation Level: Oriented X4  Cognition: Follows commands     Hearing:  Auditory  Auditory Impairment: None    Range Of Motion:  AROM: Generally decreased, functional           PROM: Generally decreased, functional (most severe in L knee d/t arthritis)           Strength:    Strength: Generally decreased, functional                 Functional Mobility:  Bed Mobility:     Supine to Sit: Maximum assistance;Assist x2  Sit to Supine: Maximum assistance;Assist x2  Scooting: Minimum assistance (scooting in sitting along EOB)  Transfers:  Sit to Stand: Maximum assistance;Assist x2  Stand to Sit: Maximum assistance;Assist x2                       Balance:   Sitting: Intact  Standing: Impaired  Standing - Static: Constant support;Poor  Standing - Dynamic :  (NT)  Ambulation/Gait Training:              Gait Description (WDL):  (unable to amb at time of eval)                  Functional Measure:  Barthel Index:    Bathing: 0  Bladder: 0  Bowels: 5  Grooming: 0  Dressing: 5  Feeding: 5  Mobility: 0  Stairs: 0  Toilet Use: 0  Transfer (Bed to Chair and Back): 0  Total: 15       Barthel and G-code impairment scale:  Percentage of impairment  CH  0% CI  1-19% CJ  20-39% CK  40-59% CL  60-79% CM  80-99% CN  100%   Barthel Score 0-100 100 99-80 79-60 59-40 20-39 1-19   0   Barthel Score 0-20 20 17-19 13-16 9-12 5-8 1-4 0      The Barthel ADL Index: Guidelines  1. The index should be used as a record of what a patient does, not as a record of what a patient could do.  2. The main aim is to establish degree of independence from any help, physical or verbal, however minor and for whatever reason.   3. The need for supervision renders the patient not independent.  4. A patient's performance should be established using the best available evidence. Asking the patient, friends/relatives and nurses are the usual sources, but direct observation and common sense are also important. However direct testing is not needed.  5. Usually the patient's performance over the preceding 24-48 hours is important, but occasionally longer periods will be relevant.  6. Middle categories imply that the patient supplies over 50 per cent of the effort.  7. Use of aids to be independent is allowed.    Clarisa Kindred., Barthel, D.W. [phone]). Functional evaluation: the Barthel Index. Md State Med J (14)2.  Zenaida Niece der [place], J.J.M.F, [place], Ian Malkin., Margret Chance., [place], [place]. (1999). Measuring the change indisability after inpatient rehabilitation; comparison of the responsiveness of the Barthel Index and Functional Independence Measure. Journal of Neurology, Neurosurgery, and Psychiatry, 66(4), [phone].  Dawson Bills, N.J.A, Scholte op [place],  W.J.M, & Koopmanschap, M.A. (2004.) Assessment of post-stroke quality of life in cost-effectiveness studies: The usefulness of the Barthel Index and the EuroQoL-5D. Quality of Life Research, 13, [phone]       G codes:  In compliance with CMS???s Claims Based Outcome Reporting, the following G-code set was chosen for this patient based on their primary functional limitation being treated:    The outcome measure chosen to determine the severity of the functional limitation was the barthel  with a score of 15/100 which was correlated with the impairment scale.    ? Mobility - Walking and Moving Around:    [phone] - CURRENT STATUS: CM - 80%-99% impaired, limited or restricted   G8979 - GOAL STATUS: CK - 40%-59% impaired, limited or restricted   U4403 - D/C STATUS:  ---------------To be determined---------------      Physical Therapy Evaluation Charge Determination    History Examination Presentation Decision-Making   HIGH Complexity :3+ comorbidities / personal factors will impact the outcome/ POC  MEDIUM Complexity : 3 Standardized tests and measures addressing body structure, function, activity limitation and / or participation in recreation  MEDIUM Complexity : Evolving with changing characteristics  LOW Complexity : FOTO score of 75-100      Based on the above components, the patient evaluation is determined to be of the following complexity level: LOW               Activity Tolerance:   Good for session; no acute distress    Please refer to the flowsheet for vital signs taken during this treatment.  After treatment:   []          Patient left in no apparent distress sitting up in chair  [x]          Patient left in no apparent distress in bed  [x]          Call bell left within reach  [x]   Nursing notified  []          Caregiver present  []          Bed alarm activated    COMMUNICATION/EDUCATION:   The patient???s plan of care was discussed with: Occupational Therapist and Registered Nurse.  [x]          Fall prevention education was provided and the patient/caregiver indicated understanding.  [x]          Patient/family have participated as able in goal setting and plan of care.  [x]          Patient/family agree to work toward stated goals and plan of care.  []          Patient understands intent and goals of therapy, but is neutral about his/her participation.  []          Patient is unable to participate in goal setting and plan of care.    Thank you for this referral.  Pamalee LeydenSusan E Van Doornik, PT   Time Calculation: 18 mins

## 2017-01-06 NOTE — Progress Notes (Signed)
Problem: Self Care Deficits Care Plan (Adult)  Goal: *Acute Goals and Plan of Care (Insert Text)  Occupational Therapy Goals  Initiated 01/06/2017  1.  Patient will perform bathing with moderate assistance  within 7 day(s).  2.  Patient will perform lower body dressing with moderate assistance, using adaptive aids prn,  within 7 day(s).  3.  Patient will perform supine to sit EOB in preparation for adls  with moderate assistance  within 7 day(s).  4.  Patient will perform toilet transfers with moderate assistance  within 7 day(s).  5.  Patient will perform all aspects of toileting with moderate assistance  within 7 day(s).  6.  Patient will participate in upper extremity therapeutic exercise/activities with supervision/set-up for 10 minutes within 7 day(s).      Occupational Therapy EVALUATION  Patient: Brittney Shelton [phone] y.o. female)  Date: 01/06/2017  Primary Diagnosis: Angioedema        Precautions: fall       ASSESSMENT :  Based on the objective data described below, the patient presents with recent admission then discharged to [facility] for rehab, now admitted for angiodema.  Pt reports feeling better and states that she was making progress in rehab, recently taking small steps in the parallel bars. Patient is functioning below her independent baseline level and is currently functioning at moderate to maximal assistance for self care and needs assistance X2 - maximal assistance- for functional mobility.  Pt will benefit from returning to complete her rehab program at snf and that is her plan.  Her goal is to walk out of rehab.      Patient will benefit from skilled intervention to address the above impairments.  Patient???s rehabilitation potential is considered to be Good  Factors which may influence rehabilitation potential include:   []              None noted  []              Mental ability/status  [x]              Medical condition  []              Home/family situation and support systems  []              Safety awareness   []              Pain tolerance/management  []              Other:      PLAN :  Recommendations and Planned Interventions:  [x]                Self Care Training                  [x]         Therapeutic Activities  [x]                Functional Mobility Training    []         Cognitive Retraining  [x]                Therapeutic Exercises           [x]         Endurance Activities  [x]                Balance Training                   []         Neuromuscular Re-Education  []   Visual/Perceptual Training     [x]    Home Safety Training  [x]                Patient Education                 [x]         Family Training/Education  []                Other (comment):    Frequency/Duration: Patient will be followed by occupational therapy 3 times a week to address goals.  Discharge Recommendations: Rehab and Skilled Nursing Facility  Further Equipment Recommendations for Discharge: tbd     SUBJECTIVE:   Patient stated ???I'm going to walk out.???  (of rehab)    OBJECTIVE DATA SUMMARY:   HISTORY:   Past Medical History:   Diagnosis Date   ??? Chronic obstructive pulmonary disease (HCC)    ??? Congestive heart failure (HCC)    ??? Diabetes (HCC)    ??? Hypertension    History reviewed. No pertinent surgical history.    Prior Level of Function/Environment/Context: Pt reports independence in adls and mobility  Occupations in which the patient is/was successful, what are the barriers preventing that success:   Performance Patterns (routines, roles, habits, and rituals):   Personal Interests and/or values:   Expanded or extensive additional review of patient history: readmit- pt had been discharged to [facility] rehab    Home Situation  Home Environment: Skilled nursing facility  [facility]  # Steps to Enter: 0  One/Two Story Residence: One story  Living Alone: No  Support Systems: Warehouse managerChurch / faith community, Family member(s), Friends \\ neighbors, Skilled nursing facility   Patient Expects to be Discharged to:: Skilled nursing facility  Current DME Used/Available at Home: Cane, straight, Oxygen, portable, Walker, rolling    Hand dominance: Right    EXAMINATION OF PERFORMANCE DEFICITS:  Cognitive/Behavioral Status:  Neurologic State: Alert;Appropriate for age  Orientation Level: Appropriate for age  Cognition: Appropriate decision making;Appropriate for age attention/concentration;Follows commands  Perception: Appears intact  Perseveration: No perseveration noted  Safety/Judgement: Insight into deficits    Skin: intact    Edema: none observed    Hearing:  Auditory  Auditory Impairment: None    Vision/Perceptual:    Tracking:  (scans environment effectively)                 Diplopia: No              Range of Motion:  BUEs:    AROM: Generally decreased, functional  PROM: Generally decreased, functional (most severe in L knee d/t arthritis)                      Strength:  BUEs:   Strength: Generally decreased, functional   grip strength is equal             Coordination:     Fine Motor Skills-Upper: Left Intact;Right Intact    Gross Motor Skills-Upper: Left Intact;Right Intact    Tone & Sensation:  Tone is normal  Sensation is intact                            Balance:  Sitting: Intact  Standing: Impaired  Standing - Static: Constant support;Poor  Standing - Dynamic :  (NT)    Functional Mobility and Transfers for ADLs:  Bed Mobility:  Supine to Sit: Maximum assistance;Assist x2  Sit  to Supine: Maximum assistance;Assist x2  Scooting: Minimum assistance (scooting in sitting along EOB)    Transfers:  Sit to Stand: Maximum assistance;Assist x2  Stand to Sit: Maximum assistance;Assist x2    ADL Assessment:  Feeding: Independent    Oral Facial Hygiene/Grooming: Setup    Bathing: Maximum assistance    Upper Body Dressing: Moderate assistance    Lower Body Dressing: Maximum assistance    Toileting: Maximum assistance                ADL Intervention and task modifications:    Pt performed bed mobility, standing.  Pt is aware of therapy and benefits of participating                                  Cognitive Retraining  Safety/Judgement: Insight into deficits    Functional Measure:  Barthel Index:    Bathing: 0  Bladder: 0  Bowels: 5  Grooming: 0  Dressing: 5  Feeding: 5  Mobility: 0  Stairs: 0  Toilet Use: 0  Transfer (Bed to Chair and Back): 0  Total: 15       Barthel and G-code impairment scale:  Percentage of impairment CH  0% CI  1-19% CJ  20-39% CK  40-59% CL  60-79% CM  80-99% CN  100%   Barthel Score 0-100 100 99-80 79-60 59-40 20-39 1-19   0   Barthel Score 0-20 20 17-19 13-16 9-12 5-8 1-4 0      The Barthel ADL Index: Guidelines  1. The index should be used as a record of what a patient does, not as a record of what a patient could do.  2. The main aim is to establish degree of independence from any help, physical or verbal, however minor and for whatever reason.  3. The need for supervision renders the patient not independent.  4. A patient's performance should be established using the best available evidence. Asking the patient, friends/relatives and nurses are the usual sources, but direct observation and common sense are also important. However direct testing is not needed.  5. Usually the patient's performance over the preceding 24-48 hours is important, but occasionally longer periods will be relevant.  6. Middle categories imply that the patient supplies over 50 per cent of the effort.  7. Use of aids to be independent is allowed.    Clarisa Kindred., Barthel, D.W. [phone]). Functional evaluation: the Barthel Index. Md State Med J (14)2.  Zenaida Niece der [place], J.J.M.F, [place], Ian Malkin., Margret Chance., [place], [place]. (1999). Measuring the change indisability after inpatient rehabilitation; comparison of the responsiveness of the Barthel Index and Functional Independence Measure. Journal of Neurology, Neurosurgery, and Psychiatry, 66(4), [phone].   Dawson Bills, N.J.A, [place],  W.J.M, & Koopmanschap, M.A. (2004.) Assessment of post-stroke quality of life in cost-effectiveness studies: The usefulness of the Barthel Index and the EuroQoL-5D. Quality of Life Research, 13, 098-11       G codes:  In compliance with CMS???s Claims Based Outcome Reporting, the following G-code set was chosen for this patient based on their primary functional limitation being treated:    The outcome measure chosen to determine the severity of the functional limitation was the Barthel Index with a score of 15/100 which was correlated with the impairment scale.    ? Self Care:    [phone] - CURRENT STATUS: CM - 80%-99% impaired, limited or restricted   G9562 -  GOAL STATUS: CL - 60%-79% impaired, limited or restricted   X0960 - D/C STATUS:  ---------------To be determined---------------     Occupational Therapy Evaluation Charge Determination   History Examination Decision-Making   LOW Complexity : Brief history review  MEDIUM Complexity : 3-5 performance deficits relating to physical, cognitive , or psychosocial skils that result in activity limitations and / or participation restrictions MEDIUM Complexity : Patient may present with comorbidities that affect occupational performnce. Miniml to moderate modification of tasks or assistance (eg, physical or verbal ) with assesment(s) is necessary to enable patient to complete evaluation       Based on the above components, the patient evaluation is determined to be of the following complexity level: LOW   Pain:  Pain Scale 1: Numeric (0 - 10)  Pain Intensity 1: 0              Activity Tolerance:   VS stable  After treatment:   []  Patient left in no apparent distress sitting up in chair  [x]  Patient left in no apparent distress in bed  [x]  Call bell left within reach  [x]  Nursing notified  []  Caregiver present  []  Bed alarm activated    COMMUNICATION/EDUCATION:   The patient???s plan of care was discussed with: Physical Therapist and  Registered Nurse.  []  Home safety education was provided and the patient/caregiver indicated understanding.  [x]  Patient/family have participated as able in goal setting and plan of care.  []  Patient/family agree to work toward stated goals and plan of care.  []  Patient understands intent and goals of therapy, but is neutral about his/her participation.  []  Patient is unable to participate in goal setting and plan of care.  This patient???s plan of care is appropriate for delegation to OTA.    Thank you for this referral.  Lambert Keto, OTR/L  Time Calculation: 27 mins

## 2017-01-07 LAB — CBC W/O DIFF
ABSOLUTE NRBC: 0 10*3/uL (ref 0.00–0.01)
HCT: 41.8 % (ref 35.0–47.0)
HGB: 13.3 g/dL (ref 11.5–16.0)
MCH: 26.8 PG (ref 26.0–34.0)
MCHC: 31.8 g/dL (ref 30.0–36.5)
MCV: 84.3 FL (ref 80.0–99.0)
MPV: 11.3 FL (ref 8.9–12.9)
NRBC: 0 PER 100 WBC
PLATELET: 289 10*3/uL (ref 150–400)
RBC: 4.96 M/uL (ref 3.80–5.20)
RDW: 16.4 % — ABNORMAL HIGH (ref 11.5–14.5)
WBC: 4.4 10*3/uL (ref 3.6–11.0)

## 2017-01-07 LAB — METABOLIC PANEL, BASIC
Anion gap: 7 mmol/L (ref 5–15)
BUN/Creatinine ratio: 29 — ABNORMAL HIGH (ref 12–20)
BUN: 32 MG/DL — ABNORMAL HIGH (ref 6–20)
CO2: 31 mmol/L (ref 21–32)
Calcium: 8.9 MG/DL (ref 8.5–10.1)
Chloride: 100 mmol/L (ref 97–108)
Creatinine: 1.12 MG/DL — ABNORMAL HIGH (ref 0.55–1.02)
GFR est AA: 59 mL/min/{1.73_m2} — ABNORMAL LOW (ref >60)
GFR est non-AA: 48 mL/min/{1.73_m2} — ABNORMAL LOW (ref >60)
Glucose: 162 mg/dL — ABNORMAL HIGH (ref 65–100)
Potassium: 4.3 mmol/L (ref 3.5–5.1)
Sodium: 138 mmol/L (ref 136–145)

## 2017-01-07 LAB — LITHIUM: Lithium level: <0.2 MMOL/L — ABNORMAL LOW (ref 0.60–1.20)

## 2017-01-07 LAB — GLUCOSE, POC
Glucose (POC): 146 mg/dL — ABNORMAL HIGH (ref 65–100)
Glucose (POC): 188 mg/dL — ABNORMAL HIGH (ref 65–100)
Glucose (POC): 212 mg/dL — ABNORMAL HIGH (ref 65–100)

## 2017-01-07 LAB — MAGNESIUM: Magnesium: 2.3 mg/dL (ref 1.6–2.4)

## 2017-01-07 LAB — PHOSPHORUS: Phosphorus: 4.5 MG/DL (ref 2.6–4.7)

## 2017-01-07 MED ORDER — DIPHENHYDRAMINE 25 MG CAP
25 mg | ORAL_CAPSULE | Freq: Four times a day (QID) | ORAL | 0 refills | Status: DC | PRN
Start: 2017-01-07 — End: 2017-03-13

## 2017-01-07 MED ORDER — EPINEPHRINE 0.3 MG/0.3 ML IM PEN INJECTOR
0.3 mg/ mL | INJECTION | Freq: Once | INTRAMUSCULAR | 0 refills | Status: AC | PRN
Start: 2017-01-07 — End: 2017-01-07

## 2017-01-07 MED ORDER — HYDRALAZINE 10 MG TAB
10 mg | ORAL_TABLET | Freq: Three times a day (TID) | ORAL | 0 refills | Status: DC
Start: 2017-01-07 — End: 2017-04-04

## 2017-01-07 MED ORDER — HYDROCODONE-ACETAMINOPHEN 5 MG-325 MG TAB
5-325 mg | ORAL_TABLET | ORAL | 0 refills | Status: DC | PRN
Start: 2017-01-07 — End: 2017-03-13

## 2017-01-07 MED ORDER — PREDNISONE 10 MG TAB
10 mg | ORAL_TABLET | ORAL | 0 refills | Status: DC
Start: 2017-01-07 — End: 2017-03-13

## 2017-01-07 MED FILL — SOLU-MEDROL (PF) 40 MG/ML SOLUTION FOR INJECTION: 40 mg/mL | INTRAMUSCULAR | Qty: 2

## 2017-01-07 MED FILL — CLONIDINE 0.1 MG TAB: 0.1 mg | ORAL | Qty: 1

## 2017-01-07 MED FILL — BD POSIFLUSH NORMAL SALINE 0.9 % INJECTION SYRINGE: INTRAMUSCULAR | Qty: 10

## 2017-01-07 MED FILL — ANORO ELLIPTA 62.5 MCG-25 MCG/ACTUATION POWDER FOR INHALATION: RESPIRATORY_TRACT | Qty: 14

## 2017-01-07 MED FILL — FAMOTIDINE (PF) 20 MG/2 ML IV: 20 mg/2 mL | INTRAVENOUS | Qty: 2

## 2017-01-07 MED FILL — VITAMIN D3 25 MCG (1,000 UNIT) TABLET: 25 mcg (1,000 unit) | ORAL | Qty: 5

## 2017-01-07 MED FILL — DOK 100 MG CAPSULE: 100 mg | ORAL | Qty: 1

## 2017-01-07 MED FILL — ASPIRIN 81 MG TAB, DELAYED RELEASE: 81 mg | ORAL | Qty: 1

## 2017-01-07 MED FILL — AMLODIPINE 5 MG TAB: 5 mg | ORAL | Qty: 2

## 2017-01-07 MED FILL — HYDROCODONE-ACETAMINOPHEN 5 MG-325 MG TAB: 5-325 mg | ORAL | Qty: 1

## 2017-01-07 MED FILL — IPRATROPIUM-ALBUTEROL 2.5 MG-0.5 MG/3 ML NEB SOLUTION: 2.5 mg-0.5 mg/3 ml | RESPIRATORY_TRACT | Qty: 3

## 2017-01-07 MED FILL — ISOSORBIDE DINITRATE 20 MG TAB: 20 mg | ORAL | Qty: 2

## 2017-01-07 MED FILL — PRAVASTATIN 10 MG TAB: 10 mg | ORAL | Qty: 2

## 2017-01-07 MED FILL — DIPHENHYDRAMINE HCL 50 MG/ML IJ SOLN: 50 mg/mL | INTRAMUSCULAR | Qty: 1

## 2017-01-07 MED FILL — SODIUM CHLORIDE 0.9 % IV: INTRAVENOUS | Qty: 1000

## 2017-01-07 MED FILL — LOVENOX 40 MG/0.4 ML SUBCUTANEOUS SYRINGE: 40 mg/0.4 mL | SUBCUTANEOUS | Qty: 0.4

## 2017-01-07 MED FILL — TOPROL XL 50 MG TABLET,EXTENDED RELEASE: 50 mg | ORAL | Qty: 4

## 2017-01-07 NOTE — Progress Notes (Signed)
Bedside and Verbal shift change report given to Jesse RN (oncoming nurse) by Torrey RN (offgoing nurse). Report included the following information SBAR, Kardex, Procedure Summary, Intake/Output and Recent Results.

## 2017-01-07 NOTE — Progress Notes (Signed)
Brittney NationsStephanie Shelton, NN [facility] Nonda LouLisa Kengo Sturges Manager of Care Management

## 2017-01-07 NOTE — Progress Notes (Signed)
Problem: Mobility Impaired (Adult and Pediatric)  Goal: *Acute Goals and Plan of Care (Insert Text)  Physical Therapy Goals  Initiated 01/06/2017  1.  Patient will move from supine to sit and sit to supine , scoot up and down and roll side to side in bed with moderate assistance  within 7 day(s).    2.  Patient will transfer from bed to chair and chair to bed with moderate assistance  using the least restrictive device within 7 day(s).  3.  Patient will perform sit to stand with moderate assistance  within 7 day(s).  4.  Patient will ambulate with moderate assistance  for 3 feet to turn to chair with the least restrictive device within 7 day(s).      physical Therapy TREATMENT  Patient: Brittney Shelton [id number] y.o. female)  Date: 01/07/2017  Diagnosis: Angioedema <principal problem not specified>       Precautions:    Chart, physical therapy assessment, plan of care and goals were reviewed.    ASSESSMENT:  Patient is much improved today.  She came to sitting EOB with CGA of 2.  Able to scoot to the EOB.  After sitting a bit to catch her breath she is able to stand with the RW and take a few steps to the bedside chair.  She has a wide based waddling gait due to obesity and bilat knee arthritis. She then stood to get repositioned and was able to lift her feet to the cross piece of the bedside table.  She is quite happy with her progress today.   Progression toward goals:  [x]     Improving appropriately and progressing toward goals  []     Improving slowly and progressing toward goals  []     Not making progress toward goals and plan of care will be adjusted     PLAN:  Patient continues to benefit from skilled intervention to address the above impairments.  Continue treatment per established plan of care.  Discharge Recommendations:  Skilled Nursing Facility  Further Equipment Recommendations for Discharge:  none     SUBJECTIVE:   Patient stated ???I was able to pick up my feet.  I haven't been able to do that.???     OBJECTIVE DATA SUMMARY:   Critical Behavior:  Neurologic State: Alert  Orientation Level: Oriented X4  Cognition: Appropriate for age attention/concentration  Safety/Judgement: Insight into deficits  Functional Mobility Training:  Bed Mobility:     Supine to Sit: Minimum assistance;Assist x2;Additional time  Sit to Supine: Minimum assistance;Assist x2;Additional time  Scooting: Supervision        Transfers:  Sit to Stand: Minimum assistance;Assist x2 (bed raised)  Stand to Sit: Minimum assistance;Assist x2                             Balance:  Sitting: Intact  Standing: Impaired  Standing - Static: Constant support;Fair  Standing - Dynamic : Fair  Ambulation/Gait Training:  Distance (ft): 2 Feet (ft)  Assistive Device: Walker, rolling;Gait belt  Ambulation - Level of Assistance: Assist x2;Contact guard assistance        Gait Abnormalities: Decreased step clearance        Base of Support: Widened     Speed/Cadence: Pace decreased (<100 feet/min)  Step Length: Right shortened;Left shortened  Therapeutic Exercises:   Encouraged to do the seated ex that she knows from rehab.   Pain:  Pain Scale 1: Numeric (0 - 10)  Pain Intensity 1: 8  Pain Location 1: Knee  Pain Orientation 1: Anterior  Pain Description 1: Aching  Pain Intervention(s) 1: Position  Activity Tolerance:   Good.   Please refer to the flowsheet for vital signs taken during this treatment.  After treatment:   [x]     Patient left in no apparent distress sitting up in chair  []     Patient left in no apparent distress in bed  [x]     Call bell left within reach  [x]     Nursing notified  []     Caregiver present  []     Bed alarm activated    COMMUNICATION/COLLABORATION:   The patient???s plan of care was discussed with: Registered Nurse and Rehabilitation Attendant    Pleas PatriciaBecky E Rasmussen, PT   Time Calculation: 15 mins

## 2017-01-07 NOTE — Progress Notes (Addendum)
I spoke with pt's brother, Alden Serverrnest [address]  He has an amputation of his toes and states pt will need [facility] transport for that as well at oxygen needs.  He is in agreement with d/c back [facility] when stable.  He anticipates d/c in 1-2 days when stable.  Updates [facility] to [facility].  If discharged, please call report to [phone], arrange [facility] transport at [phone], send facesheet, ambulance form, d/c instructions, Rx for narcotics if any, and emar.      1230  All in agreement with d/c back to [facility] today at 3p via [facility].  Please see above.      1445  Pt and staff are aware the ambulance is now set for 3:30 and report can be called.

## 2017-01-07 NOTE — Progress Notes (Signed)
Speech Pathology bedside swallow evaluation/discharge  Patient: Brittney LimesMary S Shelton [phone](66 y.o. female)  Date: 01/07/2017  Primary Diagnosis: Angioedema        Precautions:        ASSESSMENT :  Based on the objective data described below, the patient presents with functional swallow of all textures. She tolerated purees, thins and solids. She had mildly slow mastication due to reduced dentition. No oropharyngeal dysphagia.   Skilled therapy provided by a speech-language pathologist is not indicated at this time.     PLAN :  Recommendations:  Regular diet/thins  Discharge Recommendations: None     SUBJECTIVE:   Patient stated her tongue was smaller than it was initially.     OBJECTIVE:     Past Medical History:   Diagnosis Date   ??? Chronic obstructive pulmonary disease (HCC)    ??? Congestive heart failure (HCC)    ??? Diabetes (HCC)    ??? Hypertension    History reviewed. No pertinent surgical history.  Prior Level of Function/Home Situation:   Home Situation  Home Environment: Skilled nursing facility  [facility]  # Steps to Enter: 0  One/Two Story Residence: One story  Living Alone: No  Support Systems: Warehouse managerChurch / faith community, Family member(s), Friends \\ neighbors, Skilled nursing facility  Patient Expects to be Discharged to:: Skilled nursing facility  Current DME Used/Available at Home: Cane, straight, Oxygen, portable, Walker, rolling  Diet prior to admission:   Current Diet:  clears   Cognitive and Communication Status:  Neurologic State: Alert  Orientation Level: Oriented X4  Cognition: Appropriate for age attention/concentration  Perception: Appears intact  Perseveration: No perseveration noted  Safety/Judgement: Insight into deficits  Oral Assessment:  Oral Assessment  Labial: No impairment  Dentition: Natural;Limited  Lingual: No impairment (slightly enlarged)  Velum: No impairment  Mandible: No impairment  P.O. Trials:  Patient Position: upright in bed   Vocal quality prior to P.O.: No impairment  Consistency Presented: Thin liquid;Solid;Puree  How Presented: Self-fed/presented;Cup/sip;Straw     Bolus Acceptance: No impairment  Bolus Formation/Control: No impairment     Propulsion: No impairment  Oral Residue: None  Initiation of Swallow: No impairment  Laryngeal Elevation: Functional  Aspiration Signs/Symptoms: None  Pharyngeal Phase Characteristics: No impairment, issues, or problems              Oral Phase Severity: No impairment  Pharyngeal Phase Severity : No impairment  NOMS:   The NOMS functional outcome measure was used to quantify this patient's level of swallowing impairment.  Based on the NOMS, the patient was determined to be at level 7 for swallow function     G Codes:  In compliance with CMS???s Claims Based Outcome Reporting, the following G-code set was chosen for this patient based the use of the NOMS functional outcome to quantify this patient's level of swallowing impairment.    Using the NOMS, the patient was determined to be at level 7 for swallow function which correlates with the CH= 0% level of severity.    Based on the objective assessment provided within this note, the current, goal, and discharge g-codes are as follows:    Swallow  Swallowing:  G8996 Swallow Current Status CH= 0%  G8997 Swallow Goal Status CH= 0%  G8998 Swallow D/C Status CH= 0%      NOMS Swallowing Levels:  Level 1 (CN): NPO  Level 2 (CM): NPO but takes consistency in therapy  Level 3 (CL): Takes less than 50%  of nutrition p.o. and continues with nonoral feedings; and/or safe with mod cues; and/or max diet restriction  Level 4 (CK): Safe swallow but needs mod cues; and/or mod diet restriction; and/or still requires some nonoral feeding/supplements  Level 5 (CJ): Safe swallow with min diet restriction; and/or needs min cues  Level 6 (CI): Independent with p.o.; rare cues; usually self cues; may need to avoid some foods or needs extra time   Level 7 (CH): Independent for all p.o.  ASHA. (2003). National Outcomes Measurement System (NOMS): Adult Speech-Language Pathology User's Guide.       Pain:  Pain Scale 1: Numeric (0 - 10)  Pain Intensity 1: 8  Pain Location 1: Knee  After treatment:   []  Patient left in no apparent distress sitting up in chair  [x]  Patient left in no apparent distress in bed  [x]  Call bell left within reach  [x]  Nursing notified  []  Caregiver present  []  Bed alarm activated    COMMUNICATION/EDUCATION:   The patient???s plan of care including findings, recommendations, and recommended diet changes were discussed with: Registered Nurse.    []  Posted safety precautions in patient's room.  [x]  Patient/family have participated as able and agree with findings and recommendations.  []  Patient is unable to participate in plan of care at this time.    Thank you for this referral.  Dennie Fetters, SLP  Time Calculation: 12 mins

## 2017-01-07 NOTE — Progress Notes (Signed)
Spiritual Care Partner Volunteer visited patient in Ortho on 01/07/2017.  Documented by:  Meryl Hubers, M.Div  Chaplain

## 2017-01-07 NOTE — Progress Notes (Signed)
Patient discharged to [facility] in stable condition with 2 ambulance attendant by stretcher.

## 2017-01-07 NOTE — Discharge Summary (Signed)
Hospitalist Discharge Summary     Patient ID:  Brittney LimesMary S Shelton  [id number]  66 y.o.  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  African American female who presents with above complaint. Pt is currently at the rehab at [facility]. [facility] for intractable back pain. She was noticed by stuff with tongue swelling. She was last seen normal ~ 1 am this morning. Pt is on lisinopril. EMS reported O2 sat was at 88% on 4L. Pt is on chronic O2 at 4-5 L. No h.o new medications or new food. Pt denies fever/chills/dyspnea/CP.   ????  We were asked to admit for work up and evaluation of the above problems.   ______________________________________________________________________  DISCHARGE SUMMARY/HOSPITAL COURSE:  for full details see H&P, daily progress notes, labs, consult notes.     Angioedema due to lisinopril   Resolved  She is told about ACE inh allergy and recommended not to use any meds of same class and she understands  Started hydralazine for BP which can be titrated up for blood pressure as an OP   Taper steroids  PRN Benadryl     ????  Mild dehydration / hyponatremia   Resolved  Resume lasix as risk for volume overload   ????  COPD / chronic hypoxic respiratory failure  Stable, at baseline  Cont home O2 at 4-5 L  O2 to keep stas >90%  Nebs  ????  DM type II borderline, controlled with diet  SSI  ????  HTN LE edema   BP stable  DCed lisinopril as above  Resume lasix  Add hydralazine   Continue Norvasc / Imdur / clonidine / BB   ????  Chronic back pain due to multilevel degenerative disc disease with multilevel stenosis from T11-12  Continue PTA pain meds   Discharge back to SNF  ????  Hyperlipidemia,??cont statin   _______________________________________________________________________  Patient seen and examined by me on discharge day.  Pertinent Findings:  Gen:    Not in distress  Chest: Clear lungs  CVS:   Regular rhythm.  No edema  Abd:  Soft, not distended, not tender  Neuro:  Alert, non focal  _______________________________________________________________________  DISCHARGE MEDICATIONS:   Current Discharge Medication List      START taking these medications    Details   hydrALAZINE (APRESOLINE) 10 mg tablet Take 1 Tab by mouth three (3) times daily.  Qty: 90 Tab, Refills: 0      predniSONE (DELTASONE) 10 mg tablet 4 tabs daily for 2 days   3 tabs daily for 1 days   2 tabs daily for 1 days   1 tab   daily for 1 days  Qty: 14 Tab, Refills: 0      diphenhydrAMINE (BENADRYL) 25 mg capsule Take 1 Cap by mouth every six (6) hours as needed. For allergic response  Qty: 10 Cap, Refills: 0  EPINEPHrine (EPIPEN) 0.3 mg/0.3 mL injection 0.3 mL by IntraMUSCular route once as needed for up to 1 dose. Indications: Anaphylaxis  Qty: 1 Syringe, Refills: 0         CONTINUE these medications which have NOT CHANGED    Details   acetaminophen-codeine (TYLENOL-CODEINE #3) 300-30 mg per tablet Take 1 Tab by mouth daily as needed for Pain. Indications: Pain      methocarbamol (ROBAXIN) 500 mg tablet Take 1 Tab by mouth four (4) times daily.  Qty: 20 Tab, Refills: 1    Associated Diagnoses: Chronic radicular pain of lower back      aspirin delayed-release 81 mg tablet Take 81 mg by mouth daily.      HYDROcodone-acetaminophen (NORCO) 5-325 mg per tablet Take 1 Tab by mouth every four (4) hours as needed for Pain. Max Daily Amount: 6 Tabs.  Qty: 15 Tab, Refills: 0    Associated Diagnoses: Sciatica of left side       furosemide (LASIX) 40 mg tablet TAKE 1 TABLET BY MOUTH TWICE DAILY FOR SWELLING  Qty: 180 Tab, Refills: 0    Associated Diagnoses: Chronic acquired lymphedema      metoprolol succinate (TOPROL-XL) 200 mg XL tablet TAKE 1 TABLET BY MOUTH ONCE DAILY FOR HIGH BLOOD PRESSURE  Qty: 90 Tab, Refills: 0    Associated Diagnoses: Hypertension, uncontrolled      amLODIPine (NORVASC) 10 mg tablet TAKE 1 TABLET BY MOUTH DAILY  Qty: 90 Tab, Refills: 3    Associated Diagnoses: Hypertension goal BP (blood pressure) < 130/80      isosorbide dinitrate (ISORDIL) 30 mg tablet TAKE 1 TABLET BY MOUTH THREE TIMES DAILY  Qty: 270 Tab, Refills: 3    Associated Diagnoses: Hypertension, uncontrolled      cloNIDine HCl (CATAPRES) 0.1 mg tablet TAKE 1 TABLET BY MOUTH ONCE DAILY  Qty: 90 Tab, Refills: 0    Associated Diagnoses: Hypertension, uncontrolled      umeclidinium-vilanterol (ANORO ELLIPTA) 62.5-25 mcg/actuation inhaler Take 1 Puff by inhalation daily. Indications: BRONCHOSPASM PREVENTION WITH COPD  Qty: 1 Inhaler, Refills: 5    Associated Diagnoses: Mixed simple and mucopurulent chronic bronchitis (HCC)      naproxen sodium (ALEVE) 220 mg tablet Take 440 mg by mouth every twelve (12) hours as needed for Pain.      cholecalciferol, VITAMIN D3, (VITAMIN D3) 5,000 unit tab tablet Take 1 Tab by mouth daily.  Qty: 30 Tab, Refills: 5    Associated Diagnoses: Hypovitaminosis D      pravastatin (PRAVACHOL) 20 mg tablet TAKE 1 TABLET BY MOUTH ONCE DAILY AT NIGHT  Qty: 90 Tab, Refills: 5    Comments: **Patient requests 90 days supply**  Associated Diagnoses: Hypercholesterolemia      albuterol-ipratropium (DUO-NEB) 2.5 mg-0.5 mg/3 ml nebu Take 3 mL by inhalation three (3) times daily.      tiZANidine (ZANAFLEX) 4 mg capsule Take 4 mg by mouth three (3) times daily as needed (muscle spasms).         STOP taking these medications       lisinopril (PRINIVIL, ZESTRIL) 40 mg tablet Comments:   Reason for Stopping:                My Recommended Diet, Activity, Wound Care, and follow-up labs are listed in the patient's Discharge Insturctions which I have personally completed and reviewed.    ______________________________________________________________________    Risk of deterioration: Moderate    Condition at Discharge:  Stable  ______________________________________________________________________  Disposition  SNF/LTC  ______________________________________________________________________    Care Plan discussed with:   Patient, RN, Care Manager    ______________________________________________________________________    Code Status: Full Code  ______________________________________________________________________      Follow up with:   PCP : Erenest Rasher, MD  Follow-up Information     Follow up With Details Comments Contact Info    [facility])   [address]  [place] [address]  [phone]      Erenest Rasher, MD   [address]  [address]  [place] [address]  [phone]                Total time in minutes spent coordinating this discharge (includes going over instructions, follow-up, prescriptions, and preparing report for sign off to her PCP) :  35 minutes    Signed:  Jamesetta So, MD

## 2017-01-07 NOTE — Progress Notes (Signed)
ADULT PROTOCOL: JET AEROSOL ASSESSMENT    Patient  Brittney Shelton     66 y.o.   female     01/07/2017  3:08 PM    Breath Sounds Pre Procedure: Right Breath Sounds: Diminished                               Left Breath Sounds: Diminished    Breath Sounds Post Procedure: Right Breath Sounds: Rales                                 Left Breath Sounds: Rales    Breathing pattern: Pre procedure Breathing Pattern: Regular          Post procedure Breathing Pattern: Regular    Heart Rate: Pre procedure             Post procedure      Resp Rate: Pre procedure Respirations: 16           Post procedure Respirations: 20    Peak Flow: Pre bronchodilator             Post bronchodilator             Cough: Pre procedure Cough: Non-productive               Post procedure Cough: Non-productive      Oxygen: O2 Device: Nasal cannula   Flow rate (L/min) 4     Changed: NO    SpO2: Pre procedure SpO2: 93 %   with oxygen              Post procedure SpO2: 91 %  with oxygen    Nebulizer Therapy: Current medications Aerosolized Medications: DuoNeb      Changed: NO    Smoking History: never smoker    Problem List:   Patient Active Problem List   Diagnosis Code   ??? Well controlled type 2 diabetes mellitus (HCC) E11.9   ??? Hypercholesterolemia E78.00   ??? 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 COPD (chronic obstructive pulmonary disease) (HCC) J44.9   ??? Obesity E66.9   ??? Requires continuous at home supplemental oxygen Z99.81   ??? Dependence on supplemental oxygen Z99.81   ??? ACP (advance care planning) Z71.89   ??? Counseling regarding advanced care planning and goals of care Z71.89   ??? Dyspnea R06.00   ??? Acute on chronic diastolic (congestive) heart failure (HCC) I50.33   ??? Obesity, morbid (HCC) E66.01   ??? 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 Intractable back pain M54.9   ??? Ambulatory dysfunction R26.2   ??? 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 Angioedema T78.3XXA        Respiratory Therapist: Lorra HalsAmanda S Keiry Kowal, RT

## 2017-01-07 NOTE — Progress Notes (Signed)
Sbar report called to [facility] to nurse Leverne HumblesSherry Townes RN at [phone] with opportunity for questions allowed. Pt will be returning to room 49 A.

## 2017-01-07 NOTE — Other (Signed)
Short Stay Review       Pt Name:  Brittney LimesMary S Shelton   MR#  [id number]   CSN#   [id number]   Room and Hospital  3239/01  @ [facility]   Hospitalization date  01/06/2017  3:42 AM  No discharge date for patient encounter.   Current Attending Physician  Jamesetta SoMushtaq Anis, MD     A discharge order has been placed for this episode of hospital care for Brittney Shelton; since this hospital stay is less than two midnights, I reviewed Brittney Shelton's chart.     Brittney Shelton's healthcare insurance/benefit include:  Payor: [place] MEDICARE / Plan: VA MEDICARE PART A & B / Product Type: Medicare /     Utilization Review related case summary:   Age  66 y.o.   BMI  Body mass index is 48.1 kg/(m^2).    PMHx includes  Chronic hypoxic respiratory failure, COPD, HTN etc.    Hospital course  The pt was found to have significant changes with documented swelling of tongue, change of voice.    Risk of deterioration at the time this patient  was hospitalized  High             On the basis of chart review, this patient's hospitalization status      is appropriate for INPATIENT           Gabriel CarinaHasan M Justin Buechner MD MPH FACP   Physician Advisor    [facility]   [facility]  [facility]    [facility]  [facility]   Utilization Review, Care Management         CSN:  [id number]   HAR:   [id number]  Admitted on :  01/06/2017   Discharge order

## 2017-01-08 NOTE — Progress Notes (Signed)
Transition of Care Coordination/Hospital to Post Acute Facility:     Date/Time:  01/08/2017 9:05 AM    Patient was [facility] on 01[facility]/17 and discharged on 01/07/17 for angioedema due to Lisinopril.  Previous admission 7/18-23/18 for intractable back pain. Patient was [facility] back to  [facility] of care.    Inpatient RRAT score: 35    Top Challenges reviewed    none         Method of communication with care team :none       Discussed and reviewed  medication reconciliation    ACP:   Does the patient have a current ACP (including DDNR):  yes  Does the post acute facility have a copy of the patients ACP:  unknown    Medication(s):   New Medications at Discharge: hydralazine, prednisone taper, benadryl, and epi pen  Changed Medications at Discharge: none  Discontinued Medications at Discharge: Lisinopril      PCP/Specialist follow up: Future Appointments  Date Time Provider Department Center   03/18/2017 11:30 AM Victory DakinBrian L Kaminsky, MD [facility]   04/13/2017 11:00 AM Erenest RasherNau D Domah, MD [facility]        Opportunity to ask questions was provided. Contact information was provided for future reference or further questions. Will continue to monitor.

## 2017-01-08 NOTE — Progress Notes (Signed)
Community Care Team documentation for patient in Skilled Nursing Facility  Initial Follow Up       [facility] and discharged 01/07/17 to [facility].      Hospital Discharge diagnosis:  Angioadema due to lisinopril    RRAT score: 35    Advance Care Planning:  Advance directive on file.    PCP : Erenest RasherNau D Domah, MD  Nurse Navigator in PCP office: Kathlee NationsStephanie Johnson  Note routed to Nurse Navigator team.    SNF Attending:  Imogene Burnahir Khan, MD    Spoke with [name]  and team at SNF.  Ensured patient arrived to SNF safely with admission packet in order.   PT/OT evaluations to be completed today.  Patient was making slow progress prior to returning to hospital.  SNF staff note that she is still sitting up in wheelchair and seems likely did not lose much functioning while back in hospital.  Plan for discharge to home alone.  SNF staff to follow up on ortho appointment.  LOS TBD.    Community Care Team will follow up weekly with Skilled Nursing Facility until discharge.  Medications were not reconciled and general patient assessment was not completed during this Skilled Nursing Facility outreach.      Aldean Astammie W. Mackinley Cassaday, MSN, RN, ACNS-BC, CCM  Nurse Navigator, [facility]  Mobile [phone]

## 2017-01-14 NOTE — Progress Notes (Signed)
Patient called to report she is [facility]. Patient is tearful on the phone as explains she is not able to walk. She is participating in PT and using pain medication every 4 hours. This Clinical research associatewriter encouraged patient to continue with PT, keep the the faith, and take it one day a at a time. Patient expressed thanks and disconnects call.

## 2017-01-15 NOTE — Progress Notes (Signed)
Community Care Team Documentation for Patient in Skilled Nursing Facility  Subsequent Follow up     [facility]   [facility].    PCP : Erenest RasherNau D Domah, MD   Nurse Navigator in PCP office: Kathlee NationsStephanie Johnson    [name]/OT continue. Pt ambulating 2430ft with RW and contact guard assist. Family has requested private caregivers. CCT has requested UAI from hospital CM. Pt currently has Medicaid.     Medications were not reconciled and general patient assessment was not completed during this skilled nursing facility outreach.

## 2017-01-21 NOTE — Telephone Encounter (Signed)
LM waiting for call back

## 2017-01-21 NOTE — Telephone Encounter (Signed)
-----   Message from Deniece Portela sent at 01/21/2017 12:07 PM EDT -----  Regarding: Dr. Roanna Raider  Vernita,manager at pt living care, stated that the pt would like to speak with her heart and ortho doctor but pt doesn't have the information to call them. Pt would like a call back with name and numbers for the doctors.    Best contact number [phone] ext [phone]

## 2017-01-22 NOTE — Progress Notes (Signed)
Community Care Team Documentation for Patient in Skilled Nursing Facility  Subsequent Follow up     Patient remains at [facility] (Skilled Nursing Facility).   See previous [facility] Team notes.    PCP : Erenest Rasher, MD  Nurse Navigator in PCP office: Kathlee Nations    Spoke with Brittney Shelton and team at [facility]. PT/OT continue. Pt ambulating 72ft with RW. Pain limiting progress with therapy. Baseline 4L O2 through [employer]. Pt currently on 4L O2 in SNF. Plan to discharge home alone with [facility]. Pt's brother may stay with pt for short period. Pt currently has Medicaid. Family has requested private caregivers. CCT has requested UAI from hospital CM and CM manager. SNF has requested UAI from CM team. No response as of 8/23.     Medications were not reconciled and general patient assessment was not completed during this skilled nursing facility outreach.

## 2017-01-29 NOTE — Progress Notes (Signed)
Community Care Team Documentation for Patient in Skilled Nursing Facility  Subsequent Follow up     [facility]   [facility].    PCP : Brittney RasherNau D Domah, MD  Nurse Navigator in PCP office: Brittney Shelton    [facility]  PT/OT continue with slow steady progress.  Currently ambulating 30 ft with walker, progressing to single point cane, and doing better with sit to stand.  Patient will likely need supervision at home after SNF discharge.  [facility]. [facility]. Patient's brother Brittney Shelton stay with patient for short period after discharge.  Family requesting private caregivers.    Medications were not reconciled and general patient assessment was not completed during this skilled nursing facility outreach.     Brittney Astammie W. Tekesha Almgren, MSN, RN, ACNS-BC, CCM  Nurse Navigator, [facility]  Mobile [phone]

## 2017-01-30 NOTE — Progress Notes (Signed)
Brittney MarekRochelle reports patient has f/u appointment today with ortho. Waling 30 feet with walker. PT will start trying her on stairs. No discharge date has been determined. Will follow up in 2 weeks.

## 2017-02-05 ENCOUNTER — Encounter

## 2017-02-05 MED ORDER — AMLODIPINE 10 MG TAB
10 mg | ORAL_TABLET | ORAL | 1 refills | Status: DC
Start: 2017-02-05 — End: 2017-04-04

## 2017-02-05 NOTE — Progress Notes (Signed)
Community Care Team Documentation for Patient in Skilled Nursing Facility  Subsequent Follow up     [facility]   [facility].    PCP : Erenest RasherNau D Domah, MD  Nurse Navigator in PCP office: Kathlee NationsStephanie Johnson    Spoke with SNF team.  PT/OT continue with slow steady progress.  Currently ambulating 35 ft with front wheel walker; working on stairs and tolerating 1 stair now (has 3 steps to enter home).  SNF requesting copy of UAI [facility] by [facility].  [facility].  Patient's brother Aundray Cartlidge stay with patient for short period after discharge.  Family requesting private caregivers.    Medications were not reconciled and general patient assessment was not completed during this skilled nursing facility outreach.     Aldean Astammie W. Kaydi Kley, MSN, RN, ACNS-BC, CCM  Nurse Navigator, [facility]  Mobile [phone]

## 2017-02-06 NOTE — Progress Notes (Signed)
[facility] She reports patient barrier is stairs at this time. Has asked for family to measure steps at home and report to facility. She will need help at home. No discharge date at this time.

## 2017-02-08 ENCOUNTER — Encounter

## 2017-02-09 MED ORDER — METOPROLOL SUCCINATE SR 200 MG 24 HR TAB
200 mg | ORAL_TABLET | ORAL | 0 refills | Status: DC
Start: 2017-02-09 — End: 2017-04-04

## 2017-02-09 MED ORDER — PRAVASTATIN 20 MG TAB
20 mg | ORAL_TABLET | ORAL | 0 refills | Status: DC
Start: 2017-02-09 — End: 2017-04-30

## 2017-02-09 MED ORDER — FUROSEMIDE 40 MG TAB
40 mg | ORAL_TABLET | ORAL | 0 refills | Status: DC
Start: 2017-02-09 — End: 2017-03-13

## 2017-02-10 NOTE — Progress Notes (Signed)
Goals     ??? Returns to baseline activity level.            12/05/16 Patient reports she has gained weight d/t no portion control and having sodas back in diet. Patient refused to give writer number.She has followed low Na and fluid restrictions. Blood sugar has been between 120-140. Patient continues to walk. Patient is to cut back on soda,use portion control, and record weights daily and report at next week outreach. SAJ    12/10/16 Patient reports back pain today. Seeing PCP today. She is following low Na diet. Patient has agreed to give up soda. Will report outreach in 2 weeks. SAJ    02/10/17 Patient reports she is still in rehab and does not have a discharge date at this time. She will not be moving but will remain with brother. She will need knee surgery after discharge and will need to see back specialist. Will continue to monitor patient in rehab. SAJ

## 2017-02-12 NOTE — Progress Notes (Addendum)
Community Care Team Documentation for Patient in Skilled Nursing Facility  Subsequent Follow up     [facility]   [facility].    PCP : Erenest RasherNau D Domah, MD  Nurse Navigator in PCP office: Kathlee NationsStephanie Johnson    Spoke with SNF team.  PT/OT continue with slow steady progress.  Currently working on stairs and managing oxygen tubing while ambulating.  Trying to schedule home visit with therapy but having difficulty with transportation for patient.  Patient had right knee cortisone injection last week and pain much better.  [facility]  [facility]  Patient's brother Kadeshia Kasparian stay with patient for short period after discharge.  Family requesting private caregivers.    Medications were not reconciled and general patient assessment was not completed during this skilled nursing facility outreach.     Aldean Astammie W. Zayda Angell, MSN, RN, ACNS-BC, CCM  Nurse Navigator, [facility]  Mobile [phone]

## 2017-02-16 ENCOUNTER — Encounter: Attending: Cardiovascular Disease | Primary: Internal Medicine

## 2017-02-19 NOTE — Progress Notes (Signed)
Community Care Team Documentation for Patient in Skilled Nursing Facility  Subsequent Follow up     [facility]   [facility].    PCP : Erenest RasherNau D Domah, MD  Nurse Navigator in PCP office: Kathlee NationsStephanie Johnson    Spoke with SNF team.  PT/OT continue.  Currently ambulating 300 ft with front wheel walker and supervision with O2 tank.  Anticipate 2 more weeks LOS.  SNF received UAI from hospital CM and will work on plans for personal care aids.  [facility]  Date TBD.  PCP follow up 10/8 at 12:15 PM.    Medications were not reconciled and general patient assessment was not completed during this skilled nursing facility outreach.     Aldean Astammie W. Shabnam Ladd, MSN, RN, ACNS-BC, CCM  Nurse Navigator, [facility]  Mobile [phone]

## 2017-02-26 NOTE — Progress Notes (Signed)
Community Care Team Documentation for Patient in Skilled Nursing Facility  Subsequent Follow up     Patient remains at [facility] ([facility] Nursing Facility).   See previous [facility] Team notes.    PCP : Erenest Rasher, MD  Nurse Navigator in PCP office: Kathlee Nations    Spoke with SNF team. PT/OT continue. Plan to discharge next week 10/4 home alone [facility]. Bariatric walker to be ordered through [employer] supply. Private care to be provided through [employer]. SNF received UAI from hospital CM. PCP follow up 10/8 at 12:15 PM.    Medications were not reconciled and general patient assessment was not completed during this skilled nursing facility outreach.

## 2017-03-03 NOTE — Progress Notes (Signed)
PCU SHIFT NURSING NOTE    Bedside and Verbal shift change report given to Cicero Duck, Charity fundraiser (oncoming nurse) by Carollee Herter, RN (offgoing nurse). Report included the following information SBAR, Kardex, MAR and Recent Results.    Shift Summary:   0300: Assessed patients pain. Patient rated her pain at a 7 but refused any medication and intervention to relieve her pain.   0715: Bedside and Verbal shift change report given to Rosey Bath, RN & Carollee Herter, RN  (oncoming nurse) by Cicero Duck, RN (offgoing nurse). Report included the following information SBAR, Kardex, MAR and Recent Results.         Admission Date 03/11/2017   Admission Diagnosis Acute respiratory failure with hypoxia (HCC)   Consults IP CONSULT TO CARDIOLOGY  IP CONSULT TO PALLIATIVE CARE - PROVIDER  IP CONSULT TO INTENSIVIST  IP CONSULT TO ORTHOPEDIC SURGERY  IP CONSULT TO ORTHOPEDIC SURGERY        Consults   PT   OT   Speech   Case Management       Palliative      Cardiac Monitoring Order   Yes   No     IV drips   Yes    Drip:                            Dose:  Drip:                            Dose:  Drip:                            Dose:   No     GI Prophylaxis   Yes   No         DVT Prophylaxis   SCDs:             Ted stockings:          Medication   Contraindicated   None      Activity Level Activity Level: Up with Assistance     Activity Assistance: Partial (two people)   Purposeful Rounding every 1-2 hour?   Yes   Schmidt Score  Total Score: 3   Bed Alarm (If score 3 or >)   Yes    Refused (See signed refusal form in chart)   Braden Score  Braden Score: 14   Braden Score (if score 14 or less)   PMT consult   Wound Care consult      Specialty bed    Nutrition consult          Needs prior to discharge:   Home O2 required:    Yes   No    If yes, how much O2 required?    Other:    Last Bowel Movement: Last Bowel Movement Date: 03/21/17      Influenza Vaccine Received Flu Vaccine for Current Season (usually Sept-March): No     Patient/Guardian Refused (Notify MD): Yes   Pneumonia Vaccine           Diet Active Orders   Diet    DIET CARDIAC Regular      LDAs               Peripheral IV 03/12/17 Left Antecubital (Active)   Site Assessment Dry;Intact 03/23/2017  8:14 PM   Phlebitis Assessment 0 03/23/2017  8:14 PM   Infiltration Assessment 0  03/23/2017  8:14 PM   Dressing Status Dry;Intact 03/23/2017  8:14 PM   Dressing Type Tape;Transparent 03/23/2017  8:14 PM   Hub Color/Line Status Pink 03/23/2017  8:14 PM   Action Taken Open ports on tubing capped 03/22/2017  7:15 AM   Alcohol Cap Used Yes 03/22/2017  7:30 PM          External Female Catheter 03/12/17 (Active)   Site Assessment Clean, dry, & intact 03/23/2017  8:14 PM   Repositioned Yes 03/23/2017  8:14 PM   Perineal Care Yes 03/23/2017  8:14 PM   Wick Changed Yes 03/23/2017  8:14 PM   Suction Canister/Tubing Changed No 03/23/2017  8:14 PM   Urine Output (mL) 900 ml 03/20/2017  8:04 PM                Urinary Catheter      Intake & Output Date 03/23/17 0700 - 03/24/17 0659 03/24/17 0700 - 03/25/17 0659   Shift 0700-1859 1900-0659 24 Hour Total 0700-1859 1900-0659 24 Hour Total   INTAKE   P.O. 1250  1250        P.O. 1250  1250      Shift Total(mL/kg) 1250(10.1)  1250(10.1)      OUTPUT   Urine(mL/kg/hr) 650(0.4)  650        Urine Voided 650  650      Shift Total(mL/kg) 650(5.2)  650(5.2)      NET 600  600      Weight (kg) 124.2 124.2 124.2 124.2 124.2 124.2         Readmission Risk Assessment Tool Score High Risk            36       Total Score        3 Has Seen PCP in Last 6 Months (Yes=3, No=0)    3 Patient Length of Stay (>5 days = 3)    4 IP Visits Last 12 Months (1-3=4, 4=9, >4=11)    9 Pt. Coverage (Medicare=5 , Medicaid, or Self-Pay=4)    17 Charlson Comorbidity Score (Age + Comorbid Conditions)        Criteria that do not apply:    Married. Living with Significant Other. Assisted Living. LTAC. SNF. or   Rehab       Expected Length of Stay 4d 2h   Actual Length of Stay 12

## 2017-03-05 NOTE — Progress Notes (Signed)
Community Care Team Documentation for Patient in Skilled Nursing Facility  Subsequent Follow up     Patient remains at [facility] ([facility] Facility).   See previous [facility] Team notes.    PCP : Erenest Rasher, MD  Nurse Navigator in PCP office: Kathlee Nations    Spoke with SNF team.  PT/OT last treat day 10/10.  Patient is on O2 and continues to desat with therapy, which is her baseline per provider in SNF.  Patient has gained about 3 lbs in 24 hours.  Addressed by Provider in [facility] also referred to pulmonary for follow up after SNF discharge.  Plan for discharge 10/11 to home alone with caregivers through [facility].  Bariatric walker to be ordered through [employer].  SNF received UAI from hospital CM.  PCP follow up rescheduled to 10/15 at 12:15 PM.    Medications were not reconciled and general patient assessment was not completed during this skilled nursing facility outreach.     Aldean Ast, MSN, RN, ACNS-BC, CCM  Nurse Navigator, [employer] [phone]

## 2017-03-05 NOTE — Progress Notes (Signed)
Notification received from [facility], CCT, that patient discharge was postpones from 03/05/17 to 03/12/17 from SNF. Will follow up with patient upon discharge.

## 2017-03-09 ENCOUNTER — Encounter: Attending: Internal Medicine | Primary: Internal Medicine

## 2017-03-09 ENCOUNTER — Encounter

## 2017-03-11 ENCOUNTER — Emergency Department: Admit: 2017-03-12 | Payer: MEDICARE | Primary: Internal Medicine

## 2017-03-11 ENCOUNTER — Emergency Department: Payer: MEDICARE | Primary: Internal Medicine

## 2017-03-11 ENCOUNTER — Inpatient Hospital Stay
Admit: 2017-03-11 | Discharge: 2017-04-04 | Disposition: A | Discharge status: Skilled Nursing Facility | Payer: MEDICARE | Attending: Internal Medicine | Admitting: Internal Medicine

## 2017-03-11 ENCOUNTER — Inpatient Hospital Stay: Admit: 2017-03-11 | Payer: MEDICARE | Attending: Internal Medicine | Primary: Internal Medicine

## 2017-03-11 DIAGNOSIS — I110 Hypertensive heart disease with heart failure: Principal | ICD-10-CM

## 2017-03-11 DIAGNOSIS — R0602 Shortness of breath: Secondary | ICD-10-CM

## 2017-03-11 MED ORDER — KETOROLAC TROMETHAMINE 30 MG/ML INJECTION
30 mg/mL (1 mL) | INTRAMUSCULAR | Status: AC
Start: 2017-03-11 — End: 2017-03-11
  Administered 2017-03-12: via INTRAVENOUS

## 2017-03-11 MED ORDER — SODIUM CHLORIDE 0.9 % IJ SYRG
Freq: Once | INTRAMUSCULAR | Status: AC
Start: 2017-03-11 — End: 2017-03-11
  Administered 2017-03-11: 13:00:00 via INTRAVENOUS

## 2017-03-11 MED ORDER — IOPAMIDOL 76 % IV SOLN
370 mg iodine /mL (76 %) | Freq: Once | INTRAVENOUS | Status: AC
Start: 2017-03-11 — End: 2017-03-11
  Administered 2017-03-11: 13:00:00 via INTRAVENOUS

## 2017-03-11 MED ORDER — IPRATROPIUM-ALBUTEROL 2.5 MG-0.5 MG/3 ML NEB SOLUTION
2.5 mg-0.5 mg/3 ml | RESPIRATORY_TRACT | Status: AC
Start: 2017-03-11 — End: 2017-03-11
  Administered 2017-03-12: via RESPIRATORY_TRACT

## 2017-03-11 MED ORDER — OXYCODONE-ACETAMINOPHEN 5 MG-325 MG TAB
5-325 mg | ORAL | Status: AC
Start: 2017-03-11 — End: 2017-03-11
  Administered 2017-03-12: via ORAL

## 2017-03-11 MED ORDER — SODIUM CHLORIDE 0.9 % IV
Freq: Once | INTRAVENOUS | Status: AC
Start: 2017-03-11 — End: 2017-03-11
  Administered 2017-03-11: 13:00:00 via INTRAVENOUS

## 2017-03-11 MED FILL — SODIUM CHLORIDE 0.9 % IV: INTRAVENOUS | Qty: 1000

## 2017-03-11 MED FILL — ISOVUE-370  76 % INTRAVENOUS SOLUTION: 370 mg iodine /mL (76 %) | INTRAVENOUS | Qty: 100

## 2017-03-11 NOTE — ED Notes (Signed)
Patient arrival today via EMS for back pain. Patient is complaining of middle to lower back pain that started today. Patient was discharged from rehab after a 3 month stay for the same.

## 2017-03-11 NOTE — ED Provider Notes (Signed)
EMERGENCY DEPARTMENT HISTORY AND PHYSICAL EXAM      Date: 03/11/2017  Patient Name: Brittney Shelton    History of Presenting Illness     Chief Complaint   Patient presents with   ??? Back Pain     pt come via EMS for lower back pain, pt was discharged from [facility] rehab for same       History Provided By: Patient and EMS    HPI: Brittney Shelton, 66 y.o. female with PMHx significant for COPD, CHF, HTN, DM, presents via EMS to the ED with cc of worsening back pain onset today. Pt was d/c today from [facility] rehab hospital after about 3 months of rehab for back pain. However, when pt got home, she started experiencing lower back pain again that is similar to her chronic back pain. Per chart review, pt had CTA performed today that was negative for PE. Pt is on 4L O2 at home; she denies SOB. Pt denies any recent surgery or falls. She reports that she last took her pain meds at 1100 today. She denies any wheezing or urinary incontinence.    Additionally, pt specifically denies any recent fever, chills, headache, nausea, vomiting, diarrhea, abdominal pain, CP, lightheadedness, dizziness, numbness, weakness, tingling, BLE swelling, heart palpitations, urinary sxs, changes in BM, changes in PO intake, melena, hematochezia, cough, or congestion.     PCP: Ermalene Postin, MD    Social Hx:   History   Smoking Status   ??? Never Smoker   Smokeless Tobacco   ??? Never Used   ,   History   Alcohol Use No   ,   History   Drug Use No       There are no other complaints, changes or physical findings at this time.     Past History     Past Surgical History:  History reviewed. No pertinent surgical history.    Family History:  Family History   Problem Relation Age of Onset   ??? Diabetes Mother    ??? Arthritis-osteo Father    ??? COPD Sister    ??? Diabetes Brother    ??? Diabetes Brother        Allergies:  Allergies   Allergen Reactions   ??? Lisinopril Angioedema       Review of Systems   Review of Systems    Constitutional: Negative.  Negative for chills and fever.   HENT: Negative.  Negative for congestion, facial swelling, rhinorrhea, sore throat, trouble swallowing and voice change.    Eyes: Negative.    Respiratory: Negative.  Negative for apnea, cough, chest tightness, shortness of breath and wheezing.    Cardiovascular: Negative.  Negative for chest pain, palpitations and leg swelling.   Gastrointestinal: Negative.  Negative for abdominal distention, abdominal pain, blood in stool, constipation, diarrhea, nausea and vomiting.   Endocrine: Negative.  Negative for cold intolerance, heat intolerance and polyuria.   Genitourinary: Negative.  Negative for difficulty urinating, dysuria, flank pain, frequency, hematuria and urgency.   Musculoskeletal: Positive for arthralgias (L knee, chronic) and back pain. Negative for myalgias, neck pain and neck stiffness.   Skin: Negative.  Negative for color change and rash.   Neurological: Negative.  Negative for dizziness, syncope, facial asymmetry, speech difficulty, weakness, light-headedness, numbness and headaches.   Hematological: Negative.  Does not bruise/bleed easily.   Psychiatric/Behavioral: Negative.  Negative for confusion and self-injury. The patient is not nervous/anxious.        Physical Exam  Physical Exam   Constitutional: She is oriented to person, place, and time. She appears distressed.   86% SpO2 on 4L NC  Tachypneic   HENT:   Head: Normocephalic and atraumatic.   Mouth/Throat: Oropharynx is clear and moist. No oropharyngeal exudate.   Eyes: Conjunctivae and EOM are normal. Pupils are equal, round, and reactive to light.   Neck: Normal range of motion.   Cardiovascular: Normal rate, regular rhythm and normal heart sounds.  Exam reveals no gallop and no friction rub.    No murmur heard.  +2 symmetric B/L peripheral edema   Pulmonary/Chest: She is in respiratory distress. She has no wheezes. She has rales. She exhibits no tenderness.    Coarse breath sounds bll. Saturating at 86% with 4L NC.   Abdominal: Soft. Bowel sounds are normal. She exhibits no distension and no mass. There is no tenderness. There is no rebound and no guarding.   Musculoskeletal: She exhibits no edema or deformity.   Paralumbar tenderness with no midline tenderness.   Neurological: She is alert and oriented to person, place, and time. She displays normal reflexes. No cranial nerve deficit. She exhibits normal muscle tone. Coordination normal.   Skin: Skin is warm. No rash noted. She is diaphoretic.   Psychiatric: She has a normal mood and affect.   Nursing note and vitals reviewed.      Diagnostic Study Results     Labs -     Recent Results (from the past 12 hour(s))   CBC WITH AUTOMATED DIFF    Collection Time: 03/11/17  8:00 PM   Result Value Ref Range    WBC 9.7 3.6 - 11.0 K/uL    RBC 4.33 3.80 - 5.20 M/uL    HGB 12.1 11.5 - 16.0 g/dL    HCT 38.4 35.0 - 47.0 %    MCV 88.7 80.0 - 99.0 FL    MCH 27.9 26.0 - 34.0 PG    MCHC 31.5 30.0 - 36.5 g/dL    RDW 22.5 (H) 11.5 - 14.5 %    PLATELET 247 150 - 400 K/uL    MPV 11.2 8.9 - 12.9 FL    NRBC 0.0 0 PER 100 WBC    ABSOLUTE NRBC 0.00 0.00 - 0.01 K/uL    NEUTROPHILS 81 (H) 32 - 75 %    LYMPHOCYTES 9 (L) 12 - 49 %    MONOCYTES 8 5 - 13 %    EOSINOPHILS 1 0 - 7 %    BASOPHILS 0 0 - 1 %    IMMATURE GRANULOCYTES 1 (H) 0.0 - 0.5 %    ABS. NEUTROPHILS 7.8 1.8 - 8.0 K/UL    ABS. LYMPHOCYTES 0.9 0.8 - 3.5 K/UL    ABS. MONOCYTES 0.8 0.0 - 1.0 K/UL    ABS. EOSINOPHILS 0.1 0.0 - 0.4 K/UL    ABS. BASOPHILS 0.0 0.0 - 0.1 K/UL    ABS. IMM. GRANS. 0.1 (H) 0.00 - 0.04 K/UL    DF AUTOMATED      RBC COMMENTS ANISOCYTOSIS  1+       CK W/ CKMB & INDEX    Collection Time: 03/11/17  8:00 PM   Result Value Ref Range    CK 129 26 - 192 U/L    CK - MB <1.0 <3.6 NG/ML    CK-MB Index Cannot be calculated 0 - 2.5     METABOLIC PANEL, COMPREHENSIVE    Collection Time: 03/11/17  8:00 PM   Result Value Ref Range  Sodium 140 136 - 145 mmol/L     Potassium 4.0 3.5 - 5.1 mmol/L    Chloride 101 97 - 108 mmol/L    CO2 33 (H) 21 - 32 mmol/L    Anion gap 6 5 - 15 mmol/L    Glucose 108 (H) 65 - 100 mg/dL    BUN 30 (H) 6 - 20 MG/DL    Creatinine 1.22 (H) 0.55 - 1.02 MG/DL    BUN/Creatinine ratio 25 (H) 12 - 20      GFR est AA 53 (L) >60 ml/min/1.38m    GFR est non-AA 44 (L) >60 ml/min/1.720m   Calcium 8.4 (L) 8.5 - 10.1 MG/DL    Bilirubin, total 1.0 0.2 - 1.0 MG/DL    ALT (SGPT) 8 (L) 12 - 78 U/L    AST (SGOT) 15 15 - 37 U/L    Alk. phosphatase 57 45 - 117 U/L    Protein, total 7.7 6.4 - 8.2 g/dL    Albumin 3.2 (L) 3.5 - 5.0 g/dL    Globulin 4.5 (H) 2.0 - 4.0 g/dL    A-G Ratio 0.7 (L) 1.1 - 2.2     MAGNESIUM    Collection Time: 03/11/17  8:00 PM   Result Value Ref Range    Magnesium 2.0 1.6 - 2.4 mg/dL   NT-PRO BNP    Collection Time: 03/11/17  8:00 PM   Result Value Ref Range    NT pro-BNP 15698 (H) 0 - 125 PG/ML   TROPONIN I    Collection Time: 03/11/17  8:00 PM   Result Value Ref Range    Troponin-I, Qt. <0.05 <0.05 ng/mL   PROTHROMBIN TIME + INR    Collection Time: 03/11/17  8:00 PM   Result Value Ref Range    INR 1.1 0.9 - 1.1      Prothrombin time 11.1 9.0 - 11.1 sec   PTT    Collection Time: 03/11/17  8:00 PM   Result Value Ref Range    aPTT 29.4 22.1 - 32.0 sec    aPTT, therapeutic range     58.0 - 77.0 SECS   BLOOD GAS, ARTERIAL    Collection Time: 03/11/17  8:30 PM   Result Value Ref Range    pH 7.48 (H) 7.35 - 7.45      PCO2 46 (H) 35.0 - 45.0 mmHg    PO2 46 (LL) 80 - 100 mmHg    O2 SAT 84 (L) 92 - 97 %    BICARBONATE 33 (H) 22 - 26 mmol/L    BASE EXCESS 8.1 mmol/L    O2 METHOD NASAL O2      O2 FLOW RATE 5.00 L/min    FIO2 44 %    SPONTANEOUS RATE 24.0      Sample source ARTERIAL      SITE LEFT RADIAL      ALLEN'S TEST YES      Critical value read back HeFredderick ErbD        Radiologic Studies -   XR CHEST PORT   Final Result      XR SPINE LUMB 2 OR 3 V   Final Result        CT Results  (Last 48 hours)                03/11/17 0836  CTA CHEST W OR W WO CONT Final result    Impression:  IMPRESSION:   No evidence of acute pulmonary embolus. Unchanged  enlargement of the main   pulmonary artery, compatible with pulmonary hypertension. Unchanged mediastinal   lymphadenopathy. Bilateral indeterminate, ill-defined pulmonary nodules,   measuring up to 5 mm; consider follow-up CT in 12 months to assure stability or   resolution. Additional incidental findings as detailed above.       Narrative:  INDICATION: Shortness of breath       COMPARISON: December 30, 2015       TECHNIQUE:     Routine noncontrast imaging the chest was performed for localization purposes.   Then, following the uneventful intravenous administration of 100 cc [id number],   thin helical axial images were obtained through the chest. 3D image   postprocessing was performed. CT dose reduction was achieved through use of a   standardized protocol tailored for this examination and automatic exposure   control for dose modulation.        FINDINGS:       THYROID: 2 cm peripherally calcified right thyroid nodule is not significantly   changed.   MEDIASTINUM: Mediastinal lymphadenopathy is not significant change; the largest   node is a right paratracheal lymph node measuring 2.1 cm in short axis diameter.   HILA: No mass or lymphadenopathy.   THORACIC AORTA: No dissection or aneurysm.   PULMONARY ARTERIES: Main pulmonary artery is dilated, measuring 4.0 cm in   diameter. No evidence of acute pulmonary emboli.   TRACHEA/BRONCHI: Patent.   ESOPHAGUS: No wall thickening or dilatation. Small hiatal hernia.   HEART: Enlarged. Moderate coronary artery calcifications.   PLEURA: No effusion or pneumothorax.   LUNGS: There are several small bilateral ill-defined pulmonary nodules,   measuring up to 5 mm, some of which are new since the prior study. Previously   seen bilateral consolidation has resolved. There is minimal bilateral dependent   atelectasis.    INCIDENTALLY IMAGED UPPER ABDOMEN: No focal abnormality.   BONES: No destructive bone lesion.   ADDITIONAL COMMENTS: N/A               Medical Decision Making   I am the first provider for this patient.    I reviewed the vital signs, available nursing notes, past medical history, past surgical history, family history and social history.    Vital Signs-Reviewed the patient's vital signs.  Patient Vitals for the past 12 hrs:   Temp Pulse Resp BP SpO2   03/11/17 2118 - - - - (!) 84 %   03/11/17 2052 - - - - 90 %   03/11/17 1814 98.2 ??F (36.8 ??C) 87 16 124/70 (!) 86 %       Pulse Oximetry Analysis - 86% on 4L NC     ED EKG interpretation: 19:49  Rhythm: normal sinus rhythm; and regular . Rate (approx.): 78; Axis: normal; P wave: normal; QRS interval: normal ; ST/T wave: ST & T wave abnormality; Other findings: abnormal ekg. This EKG was interpreted by Fredderick Erb, MD, ED Provider.    Records Reviewed: Nursing Notes, Old Medical Records, Previous electrocardiograms, Previous Radiology Studies and Previous Laboratory Studies    Provider Notes (Medical Decision Making):   DDx: COPD exacerbation, respiratory failure, ACS, lumbar strain.    Elderly female presenting with 2 complaints, hypoxia and back pain.  Patient presents with acute dyspnea. DDx: asthma, copd, pna, pulmonary edema, acute bronchitis, ACS, ptx, pna. Will obtain EKG, labs, CXR, provide O2 as needed for hypoxia, treat symptomatically and reassess. Will continue to monitor closely in ED.     The patient  presents with acute low back pain. Stable vitals and benign exam. DDx: strain, sprain, sciatica, MSK pain. Clinical presentation not consistent with GU pathology, aortic dissection or AAA. There is no urine/bowel incontinence or perianal numbess to suggest cauda equina. No fever/chills, IVDA to suggest epidural abscess or discitis. No focal weakness or sensory changes to suggest transverse myelitis. Therefore MRI  not indicated. No risk of compression fracture (not in right age group or suffer from [place]) or trauma to warrant x-ray.    I have recommended rest, avoiding heavy lifting until better, use of intermittent heat (avoid sleeping on a heating pad), and use of OTC NSAID's (Advil, Aleve etc) or Tylenol prn for pain. Call PCP if back pain persists or she develops leg symptoms or other concerning red flags. Will provide pain control and refer to PT.    Of note, pt had negative CTA today. CT spine in July showed degenerative disease.    ED Course:   Initial assessment performed. The patients presenting problems have been discussed, and they are in agreement with the care plan formulated and outlined with them.  I have encouraged them to ask questions as they arise throughout their visit.    Medications   furosemide (LASIX) injection 40 mg (40 mg IntraVENous Given 03/12/17 0855)   metoprolol succinate (TOPROL-XL) XL tablet 200 mg (not administered)   pravastatin (PRAVACHOL) tablet 20 mg (20 mg Oral Missed 03/12/17 0212)   amLODIPine (NORVASC) tablet 10 mg (10 mg Oral Given 03/12/17 0858)   aspirin delayed-release tablet 81 mg (81 mg Oral Given 03/12/17 0858)   methocarbamol (ROBAXIN) tablet 500 mg (500 mg Oral Given 03/12/17 0858)   umeclidinium-vilanterol (ANORO ELLIPTA) 62.5 mcg- 25 mcg/inhalation (1 Puff Inhalation Given 03/12/17 0950)   heparin (porcine) injection 5,000 Units (5,000 Units SubCUTAneous Given 03/12/17 0857)   albuterol-ipratropium (DUO-NEB) 2.5 MG-0.5 MG/3 ML (3 mL Nebulization Given 03/11/17 2012)   ketorolac (TORADOL) injection 15 mg (15 mg IntraVENous Given 03/11/17 2012)   oxyCODONE-acetaminophen (PERCOCET) 5-325 mg per tablet 1 Tab (1 Tab Oral Given 03/11/17 2012)   furosemide (LASIX) injection 80 mg (80 mg IntraVENous Given 03/11/17 2248)     Progress note:  8:54 PM  Per respiratory, pt refuses BiPAP at this time. ABG with significant  hypoxia. Pt requiring escalating dose of oxygen. Currently on 6L NC. Will place pt on venti mask. Will give IV diuresis and duonebs.    CONSULT NOTE:   10:04 PM  Fredderick Erb, MD spoke with Dr. Rosamaria Lints,   Specialty: Hospitalist  Discussed pt's hx, disposition, and available diagnostic and imaging results. Reviewed care plans. Consultant will evaluate pt for admission.  Written by Jaclynn Guarneri, ED Scribe, as dictated by Fredderick Erb, MD.    Critical Care Time:   CRITICAL CARE NOTE :    10:59 PM    IMPENDING DETERIORATION -Airway, Respiratory and Cardiovascular  ASSOCIATED RISK FACTORS - Hypotension, Shock, Hypoxia, Dysrhythmia and Metabolic changes  MANAGEMENT- Bedside Assessment and Supervision of Care  INTERPRETATION -  Xrays, CT Scan, Blood Gases, ECG, Blood Pressure and Cardiac Output Measures   INTERVENTIONS - hemodynamic mngmt, vent mngmt and management of hypoxic respiratory failure requiring oxygen therapy, venti mask, IV diuretics, nebs and frequent reassessments and monitoring.  CASE REVIEW - Hospitalist, Nursing and Family  TREATMENT RESPONSE -Stable  PERFORMED BY - Self    NOTES   :    I have spent 65 minutes of critical care time involved in lab review, consultations with specialist, family  decision- making, bedside attention and documentation. During this entire length of time I was immediately available to the patient .    Critical Care:  The reason for providing this level of medical care for this critically ill patient was due to a critical illness that impaired one or more vital organ systems, such that there was a high probability of imminent or life threatening deterioration in the patient's condition. This care involved high complexity decision making to assess, manipulate, and support vital system functions, to treat this degree of vital organ system failure, and to prevent further life threatening deterioration of the patient???s condition.      Fredderick Erb, MD    Disposition:  PLAN FOR ADMISSION:   10:04 PM  The patient is being admitted to the hospital. The results of their tests and reasons for their admission have been discussed with the patient and/or available family. The patient/family has conveyed agreement and understanding for the need to be admitted and for their admission diagnosis. Consultation has been made with the inpatient physician specialist for hospitalization.   Written by Jaclynn Guarneri, ED Scribe, as dictated by Fredderick Erb, MD    Plan:  1. Admit  Diagnosis     Clinical Impression:   1. Acute on chronic respiratory failure with hypoxia and hypercapnia (HCC)    2. Acute diastolic CHF (congestive heart failure) ([place])    3. Respiratory distress    4. Lumbar degenerative disc disease    5. Pulmonary hypertension ([place])        Attestations:    This note is prepared by Jaclynn Guarneri, acting as scribe for Fredderick Erb, MD    Fredderick Erb, MD: The scribe's documentation has been prepared under my direction and personally reviewed by me in its entirety. I confirm that the note above accurately reflects all work, treatment, procedures, and medical decision making performed by me.    This note will not be viewable in [place].

## 2017-03-12 LAB — BLOOD GAS, ARTERIAL
BASE EXCESS: 8.1 mmol/L
BICARBONATE: 33 mmol/L — ABNORMAL HIGH (ref 22–26)
FIO2: 44 %
O2 FLOW RATE: 5 L/min
O2 SAT: 84 % — ABNORMAL LOW (ref 92–97)
PCO2: 46 mmHg — ABNORMAL HIGH (ref 35.0–45.0)
PO2: 46 mmHg — CL (ref 80–100)
SPONTANEOUS RATE: 24
pH: 7.48 — ABNORMAL HIGH (ref 7.35–7.45)

## 2017-03-12 LAB — CBC WITH AUTOMATED DIFF
ABS. BASOPHILS: 0 10*3/uL (ref 0.0–0.1)
ABS. EOSINOPHILS: 0.1 10*3/uL (ref 0.0–0.4)
ABS. IMM. GRANS.: 0.1 10*3/uL — ABNORMAL HIGH (ref 0.00–0.04)
ABS. LYMPHOCYTES: 0.9 10*3/uL (ref 0.8–3.5)
ABS. MONOCYTES: 0.8 10*3/uL (ref 0.0–1.0)
ABS. NEUTROPHILS: 7.8 10*3/uL (ref 1.8–8.0)
ABSOLUTE NRBC: 0 10*3/uL (ref 0.00–0.01)
BASOPHILS: 0 % (ref 0–1)
EOSINOPHILS: 1 % (ref 0–7)
HCT: 38.4 % (ref 35.0–47.0)
HGB: 12.1 g/dL (ref 11.5–16.0)
IMMATURE GRANULOCYTES: 1 % — ABNORMAL HIGH (ref 0.0–0.5)
LYMPHOCYTES: 9 % — ABNORMAL LOW (ref 12–49)
MCH: 27.9 PG (ref 26.0–34.0)
MCHC: 31.5 g/dL (ref 30.0–36.5)
MCV: 88.7 FL (ref 80.0–99.0)
MONOCYTES: 8 % (ref 5–13)
MPV: 11.2 FL (ref 8.9–12.9)
NEUTROPHILS: 81 % — ABNORMAL HIGH (ref 32–75)
NRBC: 0 PER 100 WBC
PLATELET: 247 10*3/uL (ref 150–400)
RBC: 4.33 M/uL (ref 3.80–5.20)
RDW: 22.5 % — ABNORMAL HIGH (ref 11.5–14.5)
WBC: 9.7 10*3/uL (ref 3.6–11.0)

## 2017-03-12 LAB — METABOLIC PANEL, COMPREHENSIVE
A-G Ratio: 0.7 — ABNORMAL LOW (ref 1.1–2.2)
ALT (SGPT): 8 U/L — ABNORMAL LOW (ref 12–78)
AST (SGOT): 15 U/L (ref 15–37)
Albumin: 3.2 g/dL — ABNORMAL LOW (ref 3.5–5.0)
Alk. phosphatase: 57 U/L (ref 45–117)
Anion gap: 6 mmol/L (ref 5–15)
BUN/Creatinine ratio: 25 — ABNORMAL HIGH (ref 12–20)
BUN: 30 MG/DL — ABNORMAL HIGH (ref 6–20)
Bilirubin, total: 1 MG/DL (ref 0.2–1.0)
CO2: 33 mmol/L — ABNORMAL HIGH (ref 21–32)
Calcium: 8.4 MG/DL — ABNORMAL LOW (ref 8.5–10.1)
Chloride: 101 mmol/L (ref 97–108)
Creatinine: 1.22 MG/DL — ABNORMAL HIGH (ref 0.55–1.02)
GFR est AA: 53 mL/min/{1.73_m2} — ABNORMAL LOW (ref >60)
GFR est non-AA: 44 mL/min/{1.73_m2} — ABNORMAL LOW (ref >60)
Globulin: 4.5 g/dL — ABNORMAL HIGH (ref 2.0–4.0)
Glucose: 108 mg/dL — ABNORMAL HIGH (ref 65–100)
Potassium: 4 mmol/L (ref 3.5–5.1)
Protein, total: 7.7 g/dL (ref 6.4–8.2)
Sodium: 140 mmol/L (ref 136–145)

## 2017-03-12 LAB — CK W/ CKMB & INDEX
CK - MB: <1 NG/ML (ref <3.6)
CK: 129 U/L (ref 26–192)

## 2017-03-12 LAB — GLUCOSE, POC
Glucose (POC): 116 mg/dL — ABNORMAL HIGH (ref 65–100)
Glucose (POC): 104 mg/dL — ABNORMAL HIGH (ref 65–100)
Glucose (POC): 131 mg/dL — ABNORMAL HIGH (ref 65–100)

## 2017-03-12 LAB — EKG, 12 LEAD, INITIAL
Atrial Rate: 78 {beats}/min
Atrial Rate: 98 {beats}/min
Calculated P Axis: 55 degrees
Calculated R Axis: 87 degrees
Calculated R Axis: 88 degrees
Calculated T Axis: -9 degrees
Calculated T Axis: 14 degrees
Diagnosis: NORMAL
P-R Interval: 204 ms
Q-T Interval: 334 ms
Q-T Interval: 376 ms
QRS Duration: 74 ms
QRS Duration: 78 ms
QTC Calculation (Bezet): 428 ms
QTC Calculation (Bezet): 445 ms
Ventricular Rate: 107 {beats}/min
Ventricular Rate: 78 {beats}/min

## 2017-03-12 LAB — MAGNESIUM: Magnesium: 2 mg/dL (ref 1.6–2.4)

## 2017-03-12 LAB — PTT: aPTT: 29.4 s (ref 22.1–32.0)

## 2017-03-12 LAB — PROTHROMBIN TIME + INR
INR: 1.1 (ref 0.9–1.1)
Prothrombin time: 11.1 s (ref 9.0–11.1)

## 2017-03-12 LAB — TROPONIN I: Troponin-I, Qt.: <0.05 ng/mL (ref <0.05)

## 2017-03-12 LAB — NT-PRO BNP: NT pro-BNP: 15698 PG/ML — ABNORMAL HIGH (ref 0–125)

## 2017-03-12 MED ORDER — FUROSEMIDE 10 MG/ML IJ SOLN
10 mg/mL | Freq: Once | INTRAMUSCULAR | Status: AC
Start: 2017-03-12 — End: 2017-03-11
  Administered 2017-03-12: 03:00:00 via INTRAVENOUS

## 2017-03-12 MED ORDER — METOPROLOL SUCCINATE SR 25 MG 24 HR TAB
25 mg | Freq: Every day | ORAL | Status: DC
Start: 2017-03-12 — End: 2017-03-13
  Administered 2017-03-12 – 2017-03-13 (×2): via ORAL

## 2017-03-12 MED ORDER — PRAVASTATIN 10 MG TAB
10 mg | Freq: Every evening | ORAL | Status: DC
Start: 2017-03-12 — End: 2017-04-04
  Administered 2017-03-13 – 2017-04-04 (×25): via ORAL

## 2017-03-12 MED ORDER — ASPIRIN 81 MG TAB, DELAYED RELEASE
81 mg | Freq: Every day | ORAL | Status: DC
Start: 2017-03-12 — End: 2017-04-04
  Administered 2017-03-12 – 2017-04-04 (×24): via ORAL

## 2017-03-12 MED ORDER — FUROSEMIDE 10 MG/ML IJ SOLN
10 mg/mL | Freq: Two times a day (BID) | INTRAMUSCULAR | Status: DC
Start: 2017-03-12 — End: 2017-03-12
  Administered 2017-03-12 (×2): via INTRAVENOUS

## 2017-03-12 MED ORDER — AMLODIPINE 5 MG TAB
5 mg | Freq: Every day | ORAL | Status: DC
Start: 2017-03-12 — End: 2017-04-04
  Administered 2017-03-13 – 2017-04-04 (×23): via ORAL

## 2017-03-12 MED ORDER — FUROSEMIDE 10 MG/ML IJ SOLN
10 mg/mL | Freq: Two times a day (BID) | INTRAMUSCULAR | Status: DC
Start: 2017-03-12 — End: 2017-03-14
  Administered 2017-03-12 – 2017-03-14 (×4): via INTRAVENOUS

## 2017-03-12 MED ORDER — AMLODIPINE 5 MG TAB
5 mg | Freq: Every day | ORAL | Status: DC
Start: 2017-03-12 — End: 2017-03-12
  Administered 2017-03-12: 13:00:00 via ORAL

## 2017-03-12 MED ORDER — UMECLIDINIUM 62.5 MCG-VILANTEROL 25 MCG/ACTUATION POWDR FOR INHALATION
Freq: Every day | RESPIRATORY_TRACT | Status: DC
Start: 2017-03-12 — End: 2017-04-04
  Administered 2017-03-12 – 2017-04-03 (×23): via RESPIRATORY_TRACT

## 2017-03-12 MED ORDER — METHOCARBAMOL 500 MG TAB
500 mg | Freq: Four times a day (QID) | ORAL | Status: DC
Start: 2017-03-12 — End: 2017-04-04
  Administered 2017-03-12 – 2017-04-04 (×95): via ORAL

## 2017-03-12 MED ORDER — HEPARIN (PORCINE) 5,000 UNIT/ML IJ SOLN
5000 unit/mL | Freq: Two times a day (BID) | INTRAMUSCULAR | Status: DC
Start: 2017-03-12 — End: 2017-03-14
  Administered 2017-03-12 – 2017-03-14 (×5): via SUBCUTANEOUS

## 2017-03-12 MED ORDER — METOPROLOL SUCCINATE SR 50 MG 24 HR TAB
50 mg | Freq: Every day | ORAL | Status: DC
Start: 2017-03-12 — End: 2017-03-12

## 2017-03-12 MED FILL — IPRATROPIUM-ALBUTEROL 2.5 MG-0.5 MG/3 ML NEB SOLUTION: 2.5 mg-0.5 mg/3 ml | RESPIRATORY_TRACT | Qty: 3

## 2017-03-12 MED FILL — FUROSEMIDE 10 MG/ML IJ SOLN: 10 mg/mL | INTRAMUSCULAR | Qty: 8

## 2017-03-12 MED FILL — ANORO ELLIPTA 62.5 MCG-25 MCG/ACTUATION POWDER FOR INHALATION: RESPIRATORY_TRACT | Qty: 14

## 2017-03-12 MED FILL — TOPROL XL 25 MG TABLET,EXTENDED RELEASE: 25 mg | ORAL | Qty: 1

## 2017-03-12 MED FILL — ASPIRIN 81 MG TAB, DELAYED RELEASE: 81 mg | ORAL | Qty: 1

## 2017-03-12 MED FILL — FUROSEMIDE 10 MG/ML IJ SOLN: 10 mg/mL | INTRAMUSCULAR | Qty: 4

## 2017-03-12 MED FILL — METHOCARBAMOL 500 MG TAB: 500 mg | ORAL | Qty: 1

## 2017-03-12 MED FILL — OXYCODONE-ACETAMINOPHEN 5 MG-325 MG TAB: 5-325 mg | ORAL | Qty: 1

## 2017-03-12 MED FILL — KETOROLAC TROMETHAMINE 30 MG/ML INJECTION: 30 mg/mL (1 mL) | INTRAMUSCULAR | Qty: 1

## 2017-03-12 MED FILL — TOPROL XL 50 MG TABLET,EXTENDED RELEASE: 50 mg | ORAL | Qty: 4

## 2017-03-12 MED FILL — HEPARIN (PORCINE) 5,000 UNIT/ML IJ SOLN: 5000 unit/mL | INTRAMUSCULAR | Qty: 1

## 2017-03-12 MED FILL — AMLODIPINE 5 MG TAB: 5 mg | ORAL | Qty: 2

## 2017-03-12 NOTE — Progress Notes (Signed)
Problem: Mobility Impaired (Adult and Pediatric)  Goal: *Acute Goals and Plan of Care (Insert Text)  Physical Therapy Goals  Initiated 03/12/2017  1.  Patient will move from supine to sit and sit to supine  in bed with modified independence within 7 day(s).    2.  Patient will transfer from bed to chair and chair to bed with modified independence using the least restrictive device within 7 day(s).  3.  Patient will perform sit to stand with modified independence within 7 day(s).  4.  Patient will ambulate with supervision/set-up for 50 feet with the least restrictive device within 7 day(s).   5.  Patient will ascend/descend 3 stairs with 1 handrail(s) with minimal assistance/contact guard assist within 7 day(s).  physical Therapy EVALUATION  Patient: Brittney Shelton [id number] y.o. female)  Date: 03/12/2017  Primary Diagnosis: Acute respiratory failure with hypoxia (HCC)        Precautions: fall risk       ASSESSMENT :  Based on the objective data described below, the patient presents with extreme pain in low back/left knee which impedes mobility, decreased bed mobility, and inability to tolerate sitting, standing, transfers, or gait during this evaluation. Increased time/effort and minimal assist x 2 to get patient ~1/4 way from supine to upright, limited by pain, and attempt aborted due to patient's tearfulness/pain/saturation dropping due to crying. She just left a Rehabilitation facility yesterday [facility]), discharged to home. Once her pain is under control, feel that patient should be able to resume the activity level that allowed for a discharge home (gait with walker up to 100 feet) and begin HHPT. If she is unable to restore her mobility while here, she will need more SNF Rehab, though patient says "I'm not going back to [facility]".    Patient will benefit from skilled intervention to address the above impairments.  Patient???s rehabilitation potential is considered to be Fair   Factors which may influence rehabilitation potential include:            None noted           Mental ability/status           Medical condition           Home/family situation and support systems           Safety awareness           Pain tolerance/management           Other:      PLAN :  Recommendations and Planned Interventions:             Bed Mobility Training                 Neuromuscular Re-Education             Transfer Training                       Orthotic/Prosthetic Training             Gait Training                             Modalities             Therapeutic Exercises               Edema Management/Control             Therapeutic Activities            [  x]    Patient and Family Training/Education  []            Other (comment):    Frequency/Duration: Patient will be followed by physical therapy  4 times a week to address goals.  Discharge Recommendations: Home Health or Skilled Nursing Facility  Further Equipment Recommendations for Discharge: has rolling walker     SUBJECTIVE:   Patient stated ???My brother and I look out for each other.???     OBJECTIVE DATA SUMMARY:   HISTORY:    Past Medical History:   Diagnosis Date   ??? Chronic obstructive pulmonary disease (HCC)    ??? Congestive heart failure (HCC)    ??? Diabetes (HCC)    ??? Hypertension    History reviewed. No pertinent surgical history.  Prior Level of Function/Home Situation: She and a brother live together and offer support, both have health issues. Patient was discharged from [facility] yesterday, then was unable to get out of her chair once home and EMS was called. She was walking 100 feet with rolling walker at [facility].  Personal factors and/or comorbidities impacting plan of care: obesity, arthritis left knee, unstable oxygen saturation with mobility    Home Situation  Home Environment: Private residence  # Steps to Enter: 3  Rails to Enter: Yes  Hand Rails : Bilateral (can only reach one side at a time)   One/Two Story Residence: One story  Living Alone: No  Support Systems: Family member(s) (brother)  Patient Expects to be Discharged to:: Private residence  Current DME Used/Available at Home: Cane, straight, Environmental consultant, rolling, Oxygen, portable  Tub or Shower Type: Tub/Shower combination (had been sponge bathing at rehab)  EXAMINATION/PRESENTATION/DECISION MAKING: Critical Behavior:  Neurologic State: Alert  Orientation Level: Appropriate for age, Oriented X4  Cognition: Follows commands  Safety/Judgement: Awareness of environment, Insight into deficits  Hearing:  Auditory  Auditory Impairment: NoneSkin:  Flaky skin distal LEs, + telemetry, O2 via high-flow cannula, + glasses  Edema: left foot, ankle, and calf  Range Of Motion:  AROM: Generally decreased, functional (left LE decreased as compared to right due to distal edema/arthritis left knee)           PROM: Within functional limits           Strength:    Strength: Generally decreased, functional                    Tone & Sensation:   Tone: Normal              Sensation: Intact               Coordination:  Coordination: Generally decreased, functional  Vision:   Tracking: Able to track stimulus in all quadrants w/o difficulty  Acuity: Within Defined Limits  Functional Mobility:  Bed Mobility:  Rolling: Additional time;Maximum assistance;Assist x1  Supine to Sit: Additional time;Assist x2;Minimum assistance (unable achieve full sit due to patient's back/left knee pain, becoming tearful and anxious)        Transfers:                             Balance:   Sitting: Impaired (Unable to fully come to sit after 4 attempts)Ambulation/Gait Training:            Gait Description (WDL):  (unable to attempt due to pain)  Therapeutic Exercises:   Supine AROM right LE and A/AAROM left LE in available, pain-free ranges    Functional Measure:    Elder Mobility Scale    0/20        EMS and G-code impairment scale:  Percentage of impairment CH   0% CI  1-19% CJ  20-39% CK  40-59% CL  60-79% CM  80-99% CN  100%   EMS Score 0-20 20 17-19 13-16 9-12 5-8 1-4 0      Scores under 10 ??? generally these patients are dependent in mobility maneuvers; require help with  basic ADL, such as transfers, toileting and dressing.    Scores between 10 ??? 13 ??? generally these patients are borderline in terms of safe mobility and  independence in ADL i.e. they require some help with some mobility maneuvers.    Scores over 14 ??? Generally these patients are able to perform mobility maneuvers alone and safely  and are independent in basic ADL.         G codes:  In compliance with CMS???s Claims Based Outcome Reporting, the following G-code set was chosen for this patient based on their primary functional limitation being treated:    The outcome measure chosen to determine the severity of the functional limitation was the Elderly Mobility Scale with a score of 0/20 which was correlated with the impairment scale.    ? Mobility - Walking and Moving Around:    [phone] - CURRENT STATUS: CN - 100% impaired, limited or restricted   G8979 - GOAL STATUS: CI - 1%-19% impaired, limited or restricted   W2956 - D/C STATUS:  ---------------To be determined---------------      Physical Therapy Evaluation Charge Determination   History Examination Presentation Decision-Making   MEDIUM  Complexity : 1-2 comorbidities / personal factors will impact the outcome/ POC  LOW Complexity : 1-2 Standardized tests and measures addressing body structure, function, activity limitation and / or participation in recreation  MEDIUM Complexity : Evolving with changing characteristics  LOW Complexity : FOTO score of 75-100      Based on the above components, the patient evaluation is determined to be of the following complexity level: LOW     Pain:  Pain Scale 1: Numeric (0 - 10)  Pain Intensity 1: 0              Activity Tolerance:   Poor, limited by pain and O2 saturation dropping due to crying while in pain   Please refer to the flowsheet for vital signs taken during this treatment.  After treatment:            Patient left in no apparent distress sitting up in chair           Patient left in no apparent distress in bed           Call bell left within reach           Nursing notified           Caregiver present           Bed alarm activated    COMMUNICATION/EDUCATION:   The patient???s plan of care was discussed with: Occupational Therapist and Registered Nurse.           Fall prevention education was provided and the patient/caregiver indicated understanding.           Patient/family have participated as able in goal setting and plan of care.    Patient/family agree to work toward stated goals and plan of care.           Patient understands intent and goals of therapy, but is neutral about his/her participation.           Patient is unable to participate in goal setting and plan of care.    Thank you for this referral.  Ivor Costa, PT   Time Calculation: 34 mins

## 2017-03-12 NOTE — Progress Notes (Signed)
Spiritual Care Assessment/Progress Note  [facility]      NAME: Brittney Shelton      MRN: [id number]  AGE: 66 y.o. SEX: female  Religious Affiliation: [employer] Kn[employer]   Language: English     03/12/2017     Total Time (in minutes): 48     Spiritual Assessment begun in [facility] PROGRESSIVE CARE through conversation with:         Patient         Family     Friend(s)        Reason for Consult: Palliative Care, Initial/Spiritual Assessment     Spiritual beliefs: (Please include comment if needed)      Identifies with a faith tradition:          Supported by a faith community:             Claims no spiritual orientation:            Seeking spiritual identity:                 Adheres to an individual form of spirituality:            Not able to assess:                           Identified resources for coping:       Prayer                                Music                   Guided Imagery      Family/friends                  Pet visits      Devotional reading                          Unknown      Other: Patient will join [employer] in the future                                           Interventions offered during this visit: (See comments for more details)    Patient Interventions: Affirmation of faith, Affirmation of emotions/emotional suffering, Iconic (affirming the presence of God/Higher Power), Prayer (assurance of), Prayer (actual)           Plan of Care:      Support spiritual and/or cultural needs     Support AMD and/or advance care planning process       Support grieving process    Coordinate Rites and/or Rituals     Coordination with community clergy    No spiritual needs identified at this time    Detailed Plan of Care below (See Comments)   Make referral to Music Therapy   Make referral to Pet Therapy      Make referral to Addiction services   Make referral to [facility] Passages   Make referral to Spiritual Care Partner    No future visits requested         Follow up visits as needed     Comments: The patient was resting in a relatively dark room when Dr. Talbot Grumbling  and I entered. The patient responded positively to the questions that Dr. Talbot Grumbling posed. The patient has a positive attitude and was cooperative when asked AMD related questions. After Dr. Talbot Grumbling departed the room I remained to ask the patient about how she is coping with the medical challenges that she is facing. The patient willingly spoke about her spiritual life. She indicated that she moved into the area about two years ago and has plans to join [employer] at one of its campuses. The patient indicated that she would like to have prayer before I left the room. We prayed together and I assured the patient of my continuing prayers for her recovery.          Chaplain Carollee Massed, EdD, MDiv  For Chaplain Assistance Page [phone])

## 2017-03-12 NOTE — Progress Notes (Signed)
Patient seen and examined .reviwed the H and P done this morning likely hypoxic and hypercapnic acute respiratory failure from decompensated D-CHF and pulmonary hypertension . Will continue the lasix, I and Os and daily weight and cardiology eval.patient said NO BIPAP for her when I offer her and she is on a high flow oxygen now base line 4 L at home., does not look in sever distress . will taper oxygen down as needed.  CTA chest on October 8 was negative for PE.

## 2017-03-12 NOTE — Consults (Signed)
[address] [place], [place] [address]  [phone]  CARDIOLOGY [facility]     Date of  Admission: 03/11/2017  6:23 PM     Admission type:Emergency    Consult for: CHF  Consult by: hospitalist      Subjective:     Brittney Shelton is a 66 y.o. female with PMH COPD, HF, HTN, DM who was admitted for Acute respiratory failure with hypoxia (HCC).    Per ED provider note, Brittney Shelton presented to the hospital with back pain after being discharged from rehab today. She denied SOB, chest pain, n/v, palpitations, swelling, dizziness.  She had low pulse ox readings and an elevated proBNP, prompting cardiology consult for heart failure.  On exam, Brittney Shelton endorses the above symptoms of back pain and the denial of other symptoms.  She states she is feeling well.  Her breathing and legs are baseline for her.      Brittney Shelton follows with Dr. Orvil Feil for cardiology.  ECHO 08/17 with EF 70% and NWMA;  Stress 04/17 negative.      Cardiac risk factors: sedentary life style, hypertension, post-menopausal.      Patient Active Problem List    Diagnosis Date Noted   ??? Acute respiratory failure with hypoxia (HCC) 03/12/2017   ??? Angioedema 01/06/2017   ??? 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 Ambulatory dysfunction 12/18/2016   ??? Intractable back pain 12/17/2016   ??? 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 Obesity, morbid (HCC) 05/21/2016   ??? Acute on chronic diastolic (congestive) heart failure (HCC) 01/23/2016   ??? Obesity 07/27/2015   ??? Requires continuous at home supplemental oxygen 07/27/2015   ??? Dependence on supplemental oxygen 07/27/2015   ??? ACP (advance care planning) 07/27/2015   ??? Counseling regarding advanced care planning and goals of care 07/27/2015   ??? Dyspnea 07/27/2015   ??? COPD (chronic obstructive pulmonary disease) (HCC) 07/26/2015   ??? Well controlled type 2 diabetes mellitus (HCC) 09/29/2014   ??? Hypercholesterolemia 09/29/2014    ??? 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 Chronic acquired lymphedema 09/29/2014      Erenest Rasher, MD  Past Medical History:   Diagnosis Date   ??? Chronic obstructive pulmonary disease (HCC)    ??? Congestive heart failure (HCC)    ??? Diabetes (HCC)    ??? Hypertension       Social History     Social History   ??? Marital status: SINGLE     Spouse name: N/A   ??? Number of children: N/A   ??? Years of education: N/A     Social History Main Topics   ??? Smoking status: Never Smoker   ??? Smokeless tobacco: Never Used   ??? Alcohol use No   ??? Drug use: No   ??? Sexual activity: No     Other Topics Concern   ??? None     Social History Narrative     Allergies   Allergen Reactions   ??? Lisinopril Angioedema      Family History   Problem Relation Age of Onset   ??? Diabetes Mother    ??? Arthritis-osteo Father    ??? COPD Sister    ??? Diabetes Brother    ??? Diabetes Brother       Current Facility-Administered Medications   Medication Dose Route Frequency   ??? furosemide (LASIX) injection 40 mg  40 mg IntraVENous BID   ???  metoprolol succinate (TOPROL-XL) XL tablet 200 mg  200 mg Oral DAILY   ??? pravastatin (PRAVACHOL) tablet 20 mg  20 mg Oral QHS   ??? amLODIPine (NORVASC) tablet 10 mg  10 mg Oral DAILY   ??? aspirin delayed-release tablet 81 mg  81 mg Oral DAILY   ??? methocarbamol (ROBAXIN) tablet 500 mg  500 mg Oral QID   ??? umeclidinium-vilanterol (ANORO ELLIPTA) 62.5 mcg- 25 mcg/inhalation  1 Puff Inhalation DAILY   ??? heparin (porcine) injection 5,000 Units  5,000 Units SubCUTAneous Q12H        Review of Symptoms:   11 systems reviewed, negative other than as stated in the HPI        Objective:      Visit Vitals   ??? BP 123/60 (BP Patient Position: Pre-activity;Supine)   ??? Pulse 76   ??? Temp 98.1 ??F (36.7 ??C)   ??? Resp 20   ??? Ht  (1.676 m)   ??? Wt 132.9 kg (293 lb)   ??? SpO2 94%   ??? BMI 47.29 kg/m2       Physical:   General: pleasant, obese AAF resting in bed in NAD  Heart: RRR, 3/6 systolic murmur   Lungs: clear/dim    Abdomen: Soft, +BS, NTND   Extremities: LE bil +DP/PT, ? Lymphedema.  Chronic appearance and difficult to determine if any edema.   Neurologic: Grossly normal    Data Review:   Recent Labs      03/11/17   2000   WBC  9.7   HGB  12.1   HCT  38.4   PLT  247     Recent Labs      03/11/17   2000   NA  140   K  4.0   CL  101   CO2  33*   GLU  108*   BUN  30*   CREA  1.22*   CA  8.4*   MG  2.0   ALB  3.2*   TBILI  1.0   SGOT  15   ALT  8*   INR  1.1       Recent Labs      03/11/17   2000   TROIQ  <0.05   CPK  129   CKMB  <1.0         Intake/Output Summary (Last 24 hours) at 03/12/17 1318  Last data filed at 03/12/17 0855   Gross per 24 hour   Intake              120 ml   Output              600 ml   Net             -480 ml        Cardiographics    Telemetry:  SR  ECG: read as afib (tele review did not show a fib)    Echocardiogram: last as above; repeat ordered  CXRAY: "Study is compromised by patient's body habitus. There is question of a vague patchy density at the right lung base. PA and lateral views are recommended when the patient is stable for further assessment."  CTA chest: "No evidence of acute pulmonary embolus. Unchanged enlargement of the main pulmonary artery, compatible with pulmonary hypertension. Unchanged mediastinal lymphadenopathy. Bilateral indeterminate, ill-defined pulmonary nodules, measuring up to 5 mm; consider follow-up CT in 12 months to assure stability or  resolution. Additional incidental findings as detailed  above."       Assessment:       Active Problems:    Acute respiratory failure with hypoxia (HCC) (03/12/2017)         Plan:     Brittney Shelton is a 66 y.o. female who presented with back pain and was admitted with acute respiratory failure with  Hypoxia.  CTA neg for PE, shows pulmonary nodules, and likely pulmonary HTN.  Creat 1.22;  Troponin negative;  proBNP [id number].     ?? Patient does have a very elevated proBNP, but does not appear to be in  acute heart failure.  She denies worsening symptoms and is only a few pounds away from her last office weight.  CTA does not show PE or edema.  May be tied to worsening pulmonary HTN?  ?? ECHO ordered  ?? Unclear why patient's BP borderline low despite not getting most of her medications.  Will decrease amlodipine and BB and continue to hold other medications.            Thank you for consulting [facility]    [name], NP  DNP, RN, AGACNP-BC

## 2017-03-12 NOTE — Consults (Signed)
Palliative Medicine Consult  [place]: [phone])  Patient Name: Brittney Shelton  Date of Birth: 11-11-1950    Date of Initial Consult: 03/12/17  Reason for Consult: care decisions/ bundle patient   Requesting Provider: Trula Slade MD  Primary Care Physician: Brittney Postin, MD     SUMMARY:   Brittney Shelton is a 66 y.o. 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  She  was admitted on 03/11/2017 from home  with a diagnosis of hypoxia despite on 4 litre of oxygen and and back pain .     Current medical issues leading to Palliative Medicine involvement include:  Diastolic CHF  and pulmonary hyperetension , COPD,  Currently full code , needs  Health care instruction/advance directives ,  has completed MPOA, documents,  HF bundle patient , care decisions.    Lives with his brother in one story house ,  got out of rehab yesterday, was able to  Walk with walker and needed assistance with Adls , single no children .       PALLIATIVE DIAGNOSES:   1. Advance directives   2. Chronic back pain /spasm, without sciatica  3. Sob /acute exacerbation of CHF/pul hypetrtension   4. Obesity        PLAN:   1. Met with the patient along with chaplain Brittney Shelton.  2. explained palliative care    3. Discussed her medical issues , heart failure related to long standing  hypertension , pulmonary htn , contributing to sob , importance of compliance with low sodium diet , medication and daily weight to prevent fluid overload , and hospitalization .  4. Advance directives : scanned in system , reviewed with patient ,  Confirms  designated her niece Brittney Shelton as primary mpoa and  brother Brittney Shelton as secondary , she has not completed living will , discussed the importance of completing one , she may consider later not today .  5. Resuscitation discussion : she is clear not to attempt CPR if her heart stops .       In addition  No intubation for resp distress out side end of life , YES ;  CPAP and Bipap                 YES: vasopressors , Brittney Shelton placed .  6. We agreed to follow up on living will , she will need a DDNR   7. Chronic back pain / spasm due to DJD : better continue robaxin , tylenol and work up /Mri if        not better.  6. Initial consult note routed to primary continuity provider  7. Communicated plan of care with: Palliative IDT       GOALS OF CARE / TREATMENT PREFERENCES:     GOALS OF CARE:  Patient/Health Care Proxy Stated Goals: Rehabilitation      TREATMENT PREFERENCES:   Code Status: DNR    Advance Care Planning:  Advance Care Planning 03/12/2017   Patient's Healthcare Decision Maker is: Brittney Shelton   Primary Decision Maker Name Brittney Shelton   Primary Decision  Maker Phone Number [phone] or [phone]   Primary Decision Maker Relationship to Patient Sibling   Secondary Decision Maker Name -   Secondary Decision Maker Relationship to Patient -   Confirm Advance Directive None   Patient Would Like to Complete Advance Directive No       Medical Interventions: Limited additional interventions   Other Instructions:         Other:    As far as possible, the palliative care team has discussed with patient / health care proxy about goals of care / treatment preferences for patient.           HISTORY:     History obtained from: chart and patient .    CHIEF COMPLAINT: sob and back pain .  HPI/SUBJECTIVE:    The patient is:   '[]'  Verbal and participatory  Patient reports doing well at reahb and progressed in function to walk with walker  , she is satisfied with [facility] facility , at 5: 30 am  today she got up to go to bathroom , felt excruciating pain in back and sob , came to ER.     today her back spasms are better , atleast she is able to twist and turn in bed , aleve helps with back pain along with Robaxin , some days are good without back pain , however she always have some  pain " left knee pain " due to severe arthritis , may need joint replacement soon .   She uses biofreeze cream for the knee, helps with pain .    Currently she reports back pain /spasm 8/10, currently on high flow oxygen .    She denies any nausea , vomiting or constipation .      Clinical Pain Assessment (nonverbal scale for severity on nonverbal patients):   Clinical Pain Assessment  Severity: 8  Location: lower back  Character: spasm   Duration: years   Effect: function   Frequency: comes and go           Duration: for how long has pt been experiencing pain (e.g., 2 days, 1 month, years)  Frequency: how often pain is an issue (e.g., several times per day, once every few days, constant)     FUNCTIONAL ASSESSMENT:     Palliative Performance Scale (PPS):  PPS: 40       PSYCHOSOCIAL/SPIRITUAL SCREENING:     Palliative IDT has assessed this patient for cultural preferences / practices and a referral made as appropriate to needs (Cultural Services, Patient Advocacy, Ethics, etc.)    Advance Care Planning:  Advance Care Planning 03/12/2017   Patient's Healthcare Decision Maker is: Brittney Shelton   Primary Decision Maker Name Brittney Shelton   Primary Decision Maker Phone Number [phone] or [phone]   Primary Decision Maker Relationship to Patient Sibling   Secondary Decision Maker Name -   Secondary Decision Maker Relationship to Patient -   Confirm Advance Directive None   Patient Would Like to Complete Advance Directive No       Any spiritual / religious concerns:  '[]'  Yes /  '[]'  No    Caregiver Burnout:  '[]'  Yes /  '[]'  No /  '[]'  No Caregiver Present      Anticipatory grief assessment:   '[]'  Normal  / '[]'  Maladaptive        ESAS Anxiety: Anxiety: 1    ESAS Depression: Depression: 0        REVIEW OF SYSTEMS:  Positive and pertinent negative findings in ROS are noted above in HPI.  The following systems were '[]'  reviewed / '[]'  unable to be reviewed as noted in HPI  Other findings are noted below.  Systems: constitutional, ears/nose/mouth/throat, respiratory, gastrointestinal, genitourinary, musculoskeletal, integumentary, neurologic, psychiatric, endocrine. Positive findings noted below.  Modified ESAS Completed by: provider   Fatigue: 5 Drowsiness: 0   Depression: 0 Pain: 8   Anxiety: 1 Nausea: 0   Anorexia: 0 Dyspnea: 6     Constipation: No              PHYSICAL EXAM:     From RN flowsheet:  Wt Readings from Last 3 Encounters:   03/13/17 291 lb 3.6 oz (132.1 kg)   01/06/17 298 lb (135.2 kg)   12/17/16 315 lb (142.9 kg)     Blood pressure 142/85, pulse 88, temperature 98 ??F (36.7 ??C), resp. rate 20, height '5\' 6"'  (1.676 m), weight 291 lb 3.6 oz (132.1 kg), SpO2 97 %.    Pain Scale 1: Numeric (0 - 10)  Pain Intensity 1: 8  Pain Onset 1: chronic  Pain Location 1: Back, Leg  Pain Orientation 1: Mid  Pain Description 1: Aching  Pain Intervention(s) 1: Relaxation technique, Repositioned, Rest  Last bowel movement, if known:     Constitutional: obese , alert oriented on high flow , tired.  Eyes: pupils equal, anicteric  ENMT: no nasal discharge, moist mucous membranes  Cardiovascular: regular rhythm, distal pulses intact  Respiratory: breathing not labored, symmetric  Gastrointestinal: soft non-tender, +bowel sounds  Musculoskeletal: no deformity, no tenderness to palpation  Skin: warm, dry  Neurologic: following commands, moving all extremities  Psychiatric: full affect, no hallucinations  Other:       HISTORY:     Active Problems:    Acute respiratory failure with hypoxia ([place]) (03/12/2017)      Past Medical History:   Diagnosis Date   ??? Chronic obstructive pulmonary disease ([place])    ??? Congestive heart failure ([place])    ??? Diabetes ([place])     ??? Hypertension       History reviewed. No pertinent surgical history.   Family History   Problem Relation Age of Onset   ??? Diabetes Mother    ??? Arthritis-osteo Father    ??? COPD Sister    ??? Diabetes Brother    ??? Diabetes Brother       History reviewed, no pertinent family history.  Social History   Substance Use Topics   ??? Smoking status: Never Smoker   ??? Smokeless tobacco: Never Used   ??? Alcohol use No     Allergies   Allergen Reactions   ??? Lisinopril Angioedema      Current Facility-Administered Medications   Medication Dose Route Frequency   ??? [START ON 03/14/2017] metoprolol succinate (TOPROL-XL) XL tablet 50 mg  50 mg Oral DAILY   ??? pravastatin (PRAVACHOL) tablet 20 mg  20 mg Oral QHS   ??? aspirin delayed-release tablet 81 mg  81 mg Oral DAILY   ??? methocarbamol (ROBAXIN) tablet 500 mg  500 mg Oral QID   ??? umeclidinium-vilanterol (ANORO ELLIPTA) 62.5 mcg- 25 mcg/inhalation  1 Puff Inhalation DAILY   ??? heparin (porcine) injection 5,000 Units  5,000 Units SubCUTAneous Q12H   ??? amLODIPine (NORVASC) tablet 5 mg  5 mg Oral DAILY   ??? furosemide (LASIX) injection 40 mg  40 mg IntraVENous BID          LAB AND IMAGING  FINDINGS:     Lab Results   Component Value Date/Time    WBC 5.7 03/13/2017 03:44 AM    HGB 10.8 (L) 03/13/2017 03:44 AM    PLATELET 247 03/13/2017 03:44 AM     Lab Results   Component Value Date/Time    Sodium 141 03/13/2017 03:44 AM    Potassium 3.7 03/13/2017 03:44 AM    Chloride 102 03/13/2017 03:44 AM    CO2 34 (H) 03/13/2017 03:44 AM    BUN 26 (H) 03/13/2017 03:44 AM    Creatinine 0.93 03/13/2017 03:44 AM    Calcium 7.9 (L) 03/13/2017 03:44 AM    Magnesium 2.0 03/11/2017 08:00 PM    Phosphorus 4.5 01/07/2017 06:25 AM      Lab Results   Component Value Date/Time    AST (SGOT) 9 (L) 03/13/2017 03:44 AM    Alk. phosphatase 45 03/13/2017 03:44 AM    Protein, total 6.8 03/13/2017 03:44 AM    Albumin 2.7 (L) 03/13/2017 03:44 AM    Globulin 4.1 (H) 03/13/2017 03:44 AM     Lab Results    Component Value Date/Time    INR 1.1 03/11/2017 08:00 PM    Prothrombin time 11.1 03/11/2017 08:00 PM    aPTT 29.4 03/11/2017 08:00 PM      No results found for: IRON, FE, TIBC, IBCT, PSAT, FERR   Lab Results   Component Value Date/Time    pH 7.48 (H) 03/11/2017 08:30 PM    PCO2 46 (H) 03/11/2017 08:30 PM    PO2 46 (LL) 03/11/2017 08:30 PM     No components found for: [facility]   Lab Results   Component Value Date/Time    CK 129 03/11/2017 08:00 PM    CK - MB <1.0 03/11/2017 08:00 PM                Total time:   Counseling / coordination time, spent as noted above:   > 50% counseling / coordination?:     Prolonged service was provided for  '[]' 30 min   '[]' 75 min in face to face time in the presence of the patient, spent as noted above.  Time Start:   Time End:   Note: this can only be billed with [phone] (initial) or [phone] (follow up).  If multiple start / stop times, list each separately.

## 2017-03-12 NOTE — ED Notes (Signed)
TRANSFER - OUT REPORT:    Verbal report given to michelle rn on Brittney Shelton  being transferred to 2241 for routine progression of care       Report consisted of patient???s Situation, Background, Assessment and   Recommendations(SBAR).     Information from the following report(s) SBAR was reviewed with the receiving nurse.    Lines:   Peripheral IV 03/12/17 Left Antecubital (Active)   Site Assessment Clean, dry, & intact 03/12/2017  4:02 AM   Phlebitis Assessment 0 03/12/2017  4:02 AM   Infiltration Assessment 0 03/12/2017  4:02 AM        Opportunity for questions and clarification was provided.      Patient transported with:   Registered Nurse

## 2017-03-12 NOTE — Progress Notes (Signed)
Echo done bedside

## 2017-03-12 NOTE — H&P (Signed)
Hospitalist Admission NoteNAME: Brittney Shelton   DOB:  [birth year]   MRN:  [id number]     Date/Time:  03/12/2017 1:57 AM    Patient PCP: Ermalene Postin, MD  ______________________________________________________________________  Given the patient's current clinical presentation, I have a high level of concern for decompensation if discharged from the emergency department.  Complex decision making was performed, which includes reviewing the patient's available past medical records, laboratory results, and x-ray films.       My assessment of this patient's clinical condition and my plan of care is as follows.    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 No meds listed  -SSI and POCs for comleteness though think insulin demands will be minimal if she requires any    HTN  Cont toprol and amlodipine per home regimen    HLD  -cont home pravastatin    Code Status: Full  Surrogate Decision Maker: SBarbie [phone]   DVT Prophylaxis: sq heparin  GI Prophylaxis: not indicated    Baseline: lives at home with family      Subjective:   CHIEF COMPLAINT: Hypoxia, back pain    HISTORY OF PRESENT ILLNESS:     Brittney Lannanis a 66y.o. female just discharged from SNF earlier today  after completing physical therapy who states "my back locked up" and I couldn't walk. EMS was summoned and Pt was found to be hypoxic while on her usual 4L NC O2. Patient denies and chest pain, nausea vomiting, diaphoresis. Subjectively does not feel short of breath despite hypoxia. Pt has been placed on ventimask in ED and is currently maintaining her sats. CTA chest from earlier today is negative for PE or pleural effusion. pBNP noted to be 16k.    We were asked to admit for work up and evaluation of the above problems.     Past Medical History:   Diagnosis Date   ??? [place]    ??? [place] heart [place] ([place]    ??? Diabetes ([place]    ??? Hypertension         History reviewed. No pertinent surgical history.    Social History   Substance Use Topics   ??? Smoking status: Never Smoker   ??? Smokeless tobacco: Never Used   ??? Alcohol use No        Family History   Problem Relation Age of Onset   ??? Diabetes Mother    ??? Arthritis-osteo Father    ??? COPD Sister    ??? Diabetes Brother    ??? Diabetes Brother      Allergies   Allergen Reactions   ??? Lisinopril Angioedema        Prior to Admission medications  Medication Sig Start Date End Date Taking? Authorizing Provider   metoprolol succinate (TOPROL-XL) 200 mg XL tablet TAKE 1 TABLET BY MOUTH ONCE DAILY FOR HIGH BLOOD PRESSURE 02/09/17   Ermalene Postin, MD   pravastatin (PRAVACHOL) 20 mg tablet TAKE 1 TABLET BY MOUTH DAILY AT NIGHT 02/09/17   Nau D Domah, MD   furosemide (LASIX) 40 mg tablet TAKE 1 TABLET BY MOUTH TWICE DAILY FOR SWELLING 02/09/17   Carlye Grippe Domah, MD   amLODIPine (NORVASC) 10 mg tablet TAKE 1 TABLET BY MOUTH DAILY 02/05/17   Ermalene Postin, MD   hydrALAZINE (APRESOLINE) 10 mg tablet Take 1 Tab by mouth three (3) times daily. 01/07/17   Levonne Spiller, MD   predniSONE (DELTASONE) 10 mg tablet 4 tabs daily for 2 days   3 tabs daily for 1 days   2 tabs daily for 1 days   1 tab   daily for 1 days 01/07/17   Levonne Spiller, MD    diphenhydrAMINE (BENADRYL) 25 mg capsule Take 1 Cap by mouth every six (6) hours as needed. For allergic response 01/07/17   Levonne Spiller, MD   HYDROcodone-acetaminophen (NORCO) 5-325 mg per tablet Take 1 Tab by mouth every four (4) hours as needed for Pain. Max Daily Amount: 6 Tabs. 01/07/17   Mushtaq Anis, MD   acetaminophen-codeine (TYLENOL-CODEINE #3) 300-30 mg per tablet Take 1 Tab by mouth daily as needed for Pain. Indications: Pain    Historical Provider   tiZANidine (ZANAFLEX) 4 mg capsule Take 4 mg by mouth three (3) times daily as needed (muscle spasms).    Historical Provider   methocarbamol (ROBAXIN) 500 mg tablet Take 1 Tab by mouth four (4) times daily. 12/22/16   Lynnell Chad, MD   aspirin delayed-release 81 mg tablet Take 81 mg by mouth daily.    Historical Provider   amLODIPine (NORVASC) 10 mg tablet TAKE 1 TABLET BY MOUTH DAILY 08/06/16   Ermalene Postin, MD   isosorbide dinitrate (ISORDIL) 30 mg tablet TAKE 1 TABLET BY MOUTH THREE TIMES DAILY 05/13/16   Ermalene Postin, MD   cloNIDine HCl (CATAPRES) 0.1 mg tablet TAKE 1 TABLET BY MOUTH ONCE DAILY 02/16/16   Ermalene Postin, MD   umeclidinium-vilanterol (ANORO ELLIPTA) 62.5-25 mcg/actuation inhaler Take 1 Puff by inhalation daily. Indications: BRONCHOSPASM PREVENTION WITH COPD 01/30/16   Ermalene Postin, MD   naproxen sodium (ALEVE) 220 mg tablet Take 440 mg by mouth every twelve (12) hours as needed for Pain.    Historical Provider   cholecalciferol, VITAMIN D3, (VITAMIN D3) 5,000 unit tab tablet Take 1 Tab by mouth daily. 12/11/15   Ermalene Postin, MD   albuterol-ipratropium (DUO-NEB) 2.5 mg-0.5 mg/3 ml nebu Take 3 mL by inhalation three (3) times daily.    Historical Provider       REVIEW OF SYSTEMS:     I am not able to complete the review of systems because:   The patient is intubated and sedated    The patient has altered mental status due to his acute medical problems    The patient has baseline aphasia from prior stroke(s)     The patient has baseline dementia and is not reliable historian    The patient is in acute medical distress and unable to provide information           Total of 12 systems reviewed as follows:       POSITIVE= underlined text  Negative =  text not underlined  General:  fever, chills, sweats, generalized weakness, weight loss/gain,      loss of appetite   Eyes:    blurred vision, eye pain, loss of vision, double vision  ENT:    rhinorrhea, pharyngitis   Respiratory:   cough, sputum production, SOB, DOE, wheezing, pleuritic pain   Cardiology:   chest pain, palpitations, orthopnea, PND, edema, syncope   Gastrointestinal:  abdominal pain , N/V, diarrhea, dysphagia, constipation, bleeding   Genitourinary:  frequency, urgency, dysuria, hematuria, incontinence   Muskuloskeletal :  arthralgia, myalgia, back pain  Hematology:  easy bruising, nose or gum bleeding, lymphadenopathy   Dermatological: rash, ulceration, pruritis, color change / jaundice  Endocrine:   hot flashes or polydipsia   Neurological:  headache, dizziness, confusion, focal weakness, paresthesia,     Speech difficulties, memory loss, gait difficulty  Psychological: Feelings of anxiety, depression, agitation    Objective:   VITALS:    Visit Vitals   ??? BP 99/44   ??? Pulse 77   ??? Temp 98.2 ??F (36.8 ??C)   ??? Resp 19   ??? Ht 5' 6" (1.676 m)   ??? Wt 131.5 kg (290 lb)   ??? SpO2 94%   ??? BMI 46.81 kg/m2       PHYSICAL EXAM:    General:    Alert, cooperative, no distress, appears stated age.     HEENT: Atraumatic, anicteric sclerae, pink conjunctivae     No oral ulcers, mucosa moist, throat clear, dentition fair  Neck:  Supple, symmetrical,  thyroid: non tender  Lungs:   Mild bibasilar crackles.  No Wheezing or Rhonchi. No rales.  Chest wall:  No tenderness  No Accessory muscle use.  Heart:   Regular  rhythm,  No  murmur   Mild b/l LE edema  Abdomen:   Soft, non-tender. Not distended.  Bowel sounds normal  Extremities: No cyanosis.  No clubbing,       Skin turgor normal, Capillary refill normal, Radial dial pulse 2+  Skin:     Not pale.  Not Jaundiced  No rashes   Psych:  Good insight.  Not depressed.  Not anxious or agitated.  Neurologic: EOMs intact. No facial asymmetry. No aphasia or slurred speech. Symmetrical strength, Sensation grossly intact. Alert and oriented X 4.     _______________________________________________________________________  Care Plan discussed with:    Comments   Patient x    Family      RN x    Care Manager                    Consultant:      _______________________________________________________________________  Expected  Disposition:   Home with Family    HH/PT/OT/RN    SNF/LTC    SA[facility]    ________________________________________________________________________  TOTAL TIME: 54 Minutes    Critical Care Provided     Minutes non procedure based      Comments    x Reviewed previous records   >50% of visit spent in counseling and coordination of care x Discussion with patient and/or family and questions answered       ________________________________________________________________________  Signed: Trula Slade, DO    Procedures: see electronic medical records for all procedures/Xrays and details which were not copied into this note but were reviewed prior to creation of Plan.    LAB DATA REVIEWED:    Recent Results (from the past 24 hour(s))   CBC WITH AUTOMATED DIFF  Collection Time: 03/11/17  8:00 PM   Result Value Ref Range    WBC 9.7 3.6 - 11.0 K/uL    RBC 4.33 3.80 - 5.20 M/uL    HGB 12.1 11.5 - 16.0 g/dL    HCT 38.4 35.0 - 47.0 %    MCV 88.7 80.0 - 99.0 FL    MCH 27.9 26.0 - 34.0 PG    MCHC 31.5 30.0 - 36.5 g/dL    RDW 22.5 (H) 11.5 - 14.5 %    PLATELET 247 150 - 400 K/uL    MPV 11.2 8.9 - 12.9 FL    NRBC 0.0 0 PER 100 WBC    ABSOLUTE NRBC 0.00 0.00 - 0.01 K/uL    NEUTROPHILS 81 (H) 32 - 75 %    LYMPHOCYTES 9 (L) 12 - 49 %    MONOCYTES 8 5 - 13 %    EOSINOPHILS 1 0 - 7 %    BASOPHILS 0 0 - 1 %     IMMATURE GRANULOCYTES 1 (H) 0.0 - 0.5 %    ABS. NEUTROPHILS 7.8 1.8 - 8.0 K/UL    ABS. LYMPHOCYTES 0.9 0.8 - 3.5 K/UL    ABS. MONOCYTES 0.8 0.0 - 1.0 K/UL    ABS. EOSINOPHILS 0.1 0.0 - 0.4 K/UL    ABS. BASOPHILS 0.0 0.0 - 0.1 K/UL    ABS. IMM. GRANS. 0.1 (H) 0.00 - 0.04 K/UL    DF AUTOMATED      RBC COMMENTS ANISOCYTOSIS  1+       CK W/ CKMB & INDEX    Collection Time: 03/11/17  8:00 PM   Result Value Ref Range    CK 129 26 - 192 U/L    CK - MB <1.0 <3.6 NG/ML    CK-MB Index Cannot be calculated 0 - 2.5     METABOLIC PANEL, COMPREHENSIVE    Collection Time: 03/11/17  8:00 PM   Result Value Ref Range    Sodium 140 136 - 145 mmol/L    Potassium 4.0 3.5 - 5.1 mmol/L    Chloride 101 97 - 108 mmol/L    CO2 33 (H) 21 - 32 mmol/L    Anion gap 6 5 - 15 mmol/L    Glucose 108 (H) 65 - 100 mg/dL    BUN 30 (H) 6 - 20 MG/DL    Creatinine 1.22 (H) 0.55 - 1.02 MG/DL    BUN/Creatinine ratio 25 (H) 12 - 20      GFR est AA 53 (L) >60 ml/min/1.16m    GFR est non-AA 44 (L) >60 ml/min/1.750m   Calcium 8.4 (L) 8.5 - 10.1 MG/DL    Bilirubin, total 1.0 0.2 - 1.0 MG/DL    ALT (SGPT) 8 (L) 12 - 78 U/L    AST (SGOT) 15 15 - 37 U/L    Alk. phosphatase 57 45 - 117 U/L    Protein, total 7.7 6.4 - 8.2 g/dL    Albumin 3.2 (L) 3.5 - 5.0 g/dL    Globulin 4.5 (H) 2.0 - 4.0 g/dL    A-G Ratio 0.7 (L) 1.1 - 2.2     MAGNESIUM    Collection Time: 03/11/17  8:00 PM   Result Value Ref Range    Magnesium 2.0 1.6 - 2.4 mg/dL   NT-PRO BNP    Collection Time: 03/11/17  8:00 PM   Result Value Ref Range    NT pro-BNP 15698 (H) 0 - 125 PG/ML   TROPONIN I  Collection Time: 03/11/17  8:00 PM   Result Value Ref Range    Troponin-I, Qt. <0.05 <0.05 ng/mL   PROTHROMBIN TIME + INR    Collection Time: 03/11/17  8:00 PM   Result Value Ref Range    INR 1.1 0.9 - 1.1      Prothrombin time 11.1 9.0 - 11.1 sec   PTT    Collection Time: 03/11/17  8:00 PM   Result Value Ref Range    aPTT 29.4 22.1 - 32.0 sec    aPTT, therapeutic range     58.0 - 77.0 SECS    BLOOD GAS, ARTERIAL    Collection Time: 03/11/17  8:30 PM   Result Value Ref Range    pH 7.48 (H) 7.35 - 7.45      PCO2 46 (H) 35.0 - 45.0 mmHg    PO2 46 (LL) 80 - 100 mmHg    O2 SAT 84 (L) 92 - 97 %    BICARBONATE 33 (H) 22 - 26 mmol/L    BASE EXCESS 8.1 mmol/L    O2 METHOD NASAL O2      O2 FLOW RATE 5.00 L/min    FIO2 44 %    SPONTANEOUS RATE 24.0      Sample source ARTERIAL      SITE LEFT RADIAL      ALLEN'S TEST YES      Critical value read back Fredderick Erb MD

## 2017-03-12 NOTE — Progress Notes (Signed)
[place] met with patient today to provide an introduction to self, the [place] at [facility].       Bundled Payment Care Program:    Patient was provided a copy of the [facility] letter.     Patient states that they do not have a functioning scale at home.   Patient was provided a scale during this visit.   Reinforced the importance of checking their weight at the same time daily and calling the cardiologist if their weight is up 3 lbs. in a day or 5 lbs. in a week (or per MD guidelines).    Patient  has received a "Living with Heart Failure" patient education book.    Hug At Home Program:  Patient was not interested in the [place] program. Patient was very sick and not interested in talking at this time not interested in working with electronics at this time.     Provided patient contact number for the [facility] for further questions or concerns.   Patient verbalized understanding of all information discussed.

## 2017-03-12 NOTE — Progress Notes (Signed)
Problem: Self Care Deficits Care Plan (Adult)  Goal: *Acute Goals and Plan of Care (Insert Text)  Occupational Therapy Goals  Initiated 03/12/2017    1.  Patient will perform simple grooming tasks in full bed/chair position for at least 5 minutes within 7 days.  2.  Patient will participate in anterior bathing with Min A from seated position within 7 days.  3.  Patient will perform rolling in bed with Mod A x1 in preparation for toileting and hygiene within 7 days.  4.  Patient will perform toilet transfer with overall Mod A x2 and least restrictive device PRN within 7 days.  5.  Patient will participate in all aspects of toileting with overall Mod A x1 within 7 days.  6.  Patient will complete upper body therapeutic exercises/activities to maximize independence and safety with ADLs for at least 5 minutes within 7 days.  Occupational Therapy EVALUATION  Patient: Brittney Shelton [phone] y.o. female)  Date: 03/12/2017  Primary Diagnosis: Acute respiratory failure with hypoxia (HCC)        Precautions:        ASSESSMENT :  Based on the objective data described below, the patient presents with overall Max-Total A for bed mobility, up to Total A for lower body ADLs, and independent-Mod A for upper body ADLs s/p respiratory failure with hypoxia and inability to walk d/t severe low back pain. Attempting multiple approaches for supine>sit or rolling transfers. Patient unable to tolerate any movement towards EOB, O2 sats dropping to 84-86% with exertion and increase in pain; patient holding breath, becoming tearful. On HiFlow NC 35L/min, FiO2 60%. Patient completing full body bathing in bed with fair tolerance. Patient overall limited by debilitating and severe back pain, global weakness, body habitus, decreased functional reach to BLEs, impaired mobility and up to 75% limitations in all ADLs. Patient will benefit from d/c to SNF rehab when medically stable.    Patient will benefit from skilled intervention to address the above  impairments.  Patient???s rehabilitation potential is considered to be Good  Factors which may influence rehabilitation potential include:                None noted               Mental ability/status               Medical condition               Home/family situation and support systems               Safety awareness               Pain tolerance/management               Other:      PLAN :  Recommendations and Planned Interventions:                 Self Care Training                          Therapeutic Activities                 Functional Mobility Training            Cognitive Retraining                 Therapeutic Exercises             Endurance Activities                 Balance Training                           Neuromuscular Re-Education                 Visual/Perceptual Training        Home Interior and spatial designer                 Patient Education                         Family Training/Education                 Other (comment):    Frequency/Duration: Patient will be followed by occupational therapy 5 times a week to address goals.  Discharge Recommendations: Rehab  Further Equipment Recommendations for Discharge: TBD     SUBJECTIVE:   Patient stated ???I have back pain, you know.???    OBJECTIVE DATA SUMMARY:   HISTORY:   Past Medical History:   Diagnosis Date   ??? Chronic obstructive pulmonary disease (HCC)    ??? Congestive heart failure (HCC)    ??? Diabetes (HCC)    ??? Hypertension    History reviewed. No pertinent surgical history.    Prior Level of Function/Environment/Context: Per patient report, lives at home alone at baseline. Brother lives in town and checks in on her every day. Patient with admission to [facility] in August, 2018 for same back pain symptoms. Per EMS, "female just discharged from SNF earlier today after completing physical therapy who states 'my back locked up' and I couldn't walk. EMS was summoned and Pt was found to be hypoxic while on her usual  4L NC O2". Patient stating she had been in rehab for 3 months.     Home Situation  Home Environment: Private residence  # Steps to Enter: 3  One/Two Story Residence: One story  Living Alone: No  Support Systems: Family member(s)  Patient Expects to be Discharged to:: Private residence  Current DME Used/Available at Home: Oxygen, portable, Nebulizer    Hand dominance: RightEXAMINATION OF PERFORMANCE DEFICITS:  Cognitive/Behavioral Status:  Neurologic State: Alert  Orientation Level: Oriented X4  Cognition: Appropriate decision making;Appropriate for age attention/concentration;Appropriate safety awareness;Follows commands  Perception: Appears intact  Perseveration: No perseveration noted  Safety/Judgement: Awareness of environment;Insight into deficits;Fall prevention  Skin: Appears intact    Edema: None noted in BUEs    Hearing:  Auditory  Auditory Impairment: None  Vision/Perceptual:    Tracking: Able to track stimulus in all quadrants w/o difficulty    Acuity: Within Defined Limits       Range of Motion:  In BUEs  AROM: Generally decreased, functional  PROM: Within functional limits  Strength:  In BUEs  Strength: Generally decreased, functional  Coordination:  Coordination: Within functional limits  Fine Motor Skills-Upper: Left Intact;Right Intact    Gross Motor Skills-Upper: Left Intact;Right Intact  Tone & Sensation:  In BUEs  Tone: Normal  Sensation: Intact  Balance:  Sitting: Impaired (Unable to fully come to sit after 4 attempts)  Functional Mobility and Transfers for ADLs:Bed Mobility:  Rolling: Maximum assistance (Unable to fully roll on side)    Transfers:  Toilet Transfer : Total assistance (Purewick and bedpan at this time)    ADL Assessment:  Feeding: Independent    Oral Facial Hygiene/Grooming:  Setup (Completing supine in bed)    Bathing: Moderate assistance (Able to do anterior upper body)    Upper Body Dressing: Moderate assistance (Inferred for pulling over head and down back d/t mobiliy def)     Lower Body Dressing: Total assistance    Toileting: Total assistance  ADL Intervention and task modifications:  Grooming  Washing Face: Supervision/set-up    Upper Body Bathing  Bathing Assistance: Supervision/set-up  Position Performed:  (Supine)    Lower Body Bathing  Bathing Assistance: Maximum assistance  Perineal  : Maximum assistance  Position Performed: Supine  Lower Body : Moderate assistance (Able to bathe to knees only)  Position Performed: Supine    Upper Body Dressing Assistance  Hospital Gown: Minimum  assistance (For line/lead management)    Toileting  Toileting Assistance: Total assistance(dependent)  Bladder Hygiene: Total assistance (dependent)  Bowel Hygiene: Total assistance (dependent)  Clothing Management: Total assistance (dependent)    Cognitive Retraining  Safety/Judgement: Awareness of environment;Insight into deficits;Fall prevention    Functional Measure:  Barthel Index:    Bathing: 0  Bladder: 5  Bowels: 10  Grooming: 0  Dressing: 0  Feeding: 10  Mobility: 0  Stairs: 0  Toilet Use: 0  Transfer (Bed to Chair and Back): 0  Total: 25      Barthel and G-code impairment scale:  Percentage of impairment CH  0% CI  1-19% CJ  20-39% CK  40-59% CL  60-79% CM  80-99% CN  100%   Barthel Score 0-100 100 99-80 79-60 59-40 20-39 1-19   0   Barthel Score 0-20 20 17-19 13-16 9-12 5-8 1-4 0      The Barthel ADL Index: Guidelines  1. The index should be used as a record of what a patient does, not as a record of what a patient could do.  2. The main aim is to establish degree of independence from any help, physical or verbal, however minor and for whatever reason.  3. The need for supervision renders the patient not independent.  4. A patient's performance should be established using the best available evidence. Asking the patient, friends/relatives and nurses are the usual sources, but direct observation and common sense are also important. However direct testing is not needed.   5. Usually the patient's performance over the preceding 24-48 hours is important, but occasionally longer periods will be relevant.  6. Middle categories imply that the patient supplies over 50 per cent of the effort.  7. Use of aids to be independent is allowed.    Clarisa Kindred., Barthel, D.W. [phone]). Functional evaluation: the Barthel Index. Md State Med J (14)2.  Zenaida Niece der [place], J.J.M.F, [place], Ian Malkin., Margret Chance., [place], [place]. (1999). Measuring the change indisability after inpatient rehabilitation; comparison of the responsiveness of the Barthel Index and Functional Independence Measure. Journal of Neurology, Neurosurgery, and Psychiatry, 66(4), [phone].  Dawson Bills, N.J.A, [place],  W.J.M, & [place], M.A. (2004.) Assessment of post-stroke quality of life in cost-effectiveness studies: The usefulness of the Barthel Index and the EuroQoL-5D. Quality of Life Research, 13, 098-11       G codes:  In compliance with CMS???s Claims Based Outcome Reporting, the following G-code set was chosen for this patient based on their primary functional limitation being treated:    The outcome measure chosen to determine the severity of the functional limitation was the Barthel Index with a score of 25/100 which was correlated with the impairment scale.    ? Self Care:  O9629 - CURRENT STATUS: CL - 60%-79% impaired, limited or restricted   B2841 - GOAL STATUS: CK - 40%-59% impaired, limited or restricted   L2440 - D/C STATUS:  ---------------To be determined---------------     Occupational Therapy Evaluation Charge Determination   History Examination Decision-Making   LOW Complexity : Brief history review  HIGH Complexity : 5 or more performance deficits relating to physical, cognitive , or psychosocial skils that result in activity limitations and / or participation restrictions MEDIUM Complexity : Patient may present with comorbidities that affect occupational performnce. Miniml to moderate modification of  tasks or assistance (eg, physical or verbal ) with assesment(s) is necessary to enable patient to complete evaluation       Based on the above components, the patient evaluation is determined to be of the following complexity level: LOW   Pain:  Pain Scale 1: Numeric (0 - 10)  Pain Intensity 1: 0              Activity Tolerance:   Fair. Limited severely by back pain.  Please refer to the flowsheet for vital signs taken during this treatment.  After treatment:   []  Patient left in no apparent distress sitting up in chair  [x]  Patient left in no apparent distress in bed  [x]  Call bell left within reach  [x]  Nursing notified  []  Caregiver present  []  Bed alarm activated    COMMUNICATION/EDUCATION:   The patient???s plan of care was discussed with: Physical Therapist and Registered Nurse.  [x]  Home safety education was provided and the patient/caregiver indicated understanding.  [x]  Patient/family have participated as able in goal setting and plan of care.  []  Patient/family agree to work toward stated goals and plan of care.  []  Patient understands intent and goals of therapy, but is neutral about his/her participation.  []  Patient is unable to participate in goal setting and plan of care.  This patient???s plan of care is appropriate for delegation to OTA.    Thank you for this referral.  Carmelina Paddock, OT  Time Calculation: 32 mins

## 2017-03-12 NOTE — Other (Deleted)
Dr. Westley Hummer, Sharlyn Bologna. :    Please clarify if this patient is (was) being treated/managed for:     => Acute on chronic diastolic (congestive) heart failure POA  =>Chronic diastolic CHF POA  => Other explanation of clinical findings  => Clinically Undetermined (no explanation for clinical findings)    The medical record reflects the following clinical findings, treatment, and risk factors.    Risk Factors:  Hx: CHF  Clinical Indicators:  Admitted with CHF exacerbation, Admit Pro bNP: [phone] and CXR "There is question of a vague patchy density at the right lung base", per cardiology note "Patient does have a very elevated proBNP, but does not appear to be in acute heart failure", Echo EF: 60-65%  Treatment: Lasix IV, daily weight, strict I&O, cardio consult    Please clarify and document your clinical opinion in the progress notes and discharge summary including the definitive and/or presumptive diagnosis, (suspected or probable), related to the above clinical findings. Please include clinical findings supporting your diagnosis.    Thank You,   Randa Ngo  [email or web address] .[place]  [phone]

## 2017-03-12 NOTE — Progress Notes (Signed)
Brief summary of visit : full consult note to follow.    Met with the patient , she is not in any distress.   back pain and spasms are better currently .  Her goal is to get better , and walk with walker , patient shared her  Recent experience with rehab for 3 months(discahrged yesterday ),  had been good , was able to walk with walker , needed some assistance with ADLS.    Resuscitation  Discussion :    Patient decided for DNAR , if her heart stop and No intubation for resp distress out side end of life .   She has decided for limited intervention including CPAP and Bipap and vasopressors .    Pink sheet placed .    We will follow for DDNR , and completion of living will portion of advance directives , she has MPOA , scanned in chart .    Appreciate palliative care consult.    Conchita Paris MD    [phone]

## 2017-03-12 NOTE — Progress Notes (Addendum)
TRANSFER - IN REPORT:    Verbal report received from Justin RN(name) on Brittney Shelton  being received from ED(unit) for routine progression of care      Report consisted of patient???s Situation, Background, Assessment and   Recommendations(SBAR).     Information from the following report(s) SBAR, Kardex, MAR, Recent Results and Cardiac Rhythm NSR was reviewed with the receiving nurse.    Opportunity for questions and clarification was provided.      Assessment completed upon patient???s arrival to unit and care assumed.     Primary Nurse Fletcher Anon, RN and Claria Dice, RN performed a dual skin assessment on this patient No impairment noted other than excoriation to bilateral groin areas.  No open wounds.  Braden score is 14

## 2017-03-12 NOTE — Progress Notes (Signed)
Bedside shift change report given to Gasper Sells RN (oncoming nurse) by Fletcher Anon RN (offgoing nurse). Report included the following information SBAR, Kardex, MAR, Recent Results and Cardiac Rhythm NSR.    Pt has not voided since arriving to unit.  Was told in report that pt had one large voiding episode after receiving IV lasix, however wick dislodged, so output unable to be recorded.  Pt denies urge to void.  Bladder scanned pt at 0630.  Bladder scan show 487 mL in bladder.  Day shift nurse, Huntley Dec will follow up with attending on next steps regarding retention.Fletcher Anon, RN

## 2017-03-12 NOTE — ED Notes (Signed)
MD notified of labile O2 sats and ordered high flow nasal cannula. Will speak with patient regarding bipap. Pt denies sob, cp

## 2017-03-13 ENCOUNTER — Encounter: Primary: Internal Medicine

## 2017-03-13 LAB — METABOLIC PANEL, COMPREHENSIVE
A-G Ratio: 0.7 — ABNORMAL LOW (ref 1.1–2.2)
ALT (SGPT): 8 U/L — ABNORMAL LOW (ref 12–78)
AST (SGOT): 9 U/L — ABNORMAL LOW (ref 15–37)
Albumin: 2.7 g/dL — ABNORMAL LOW (ref 3.5–5.0)
Alk. phosphatase: 45 U/L (ref 45–117)
Anion gap: 5 mmol/L (ref 5–15)
BUN/Creatinine ratio: 28 — ABNORMAL HIGH (ref 12–20)
BUN: 26 MG/DL — ABNORMAL HIGH (ref 6–20)
Bilirubin, total: 0.8 MG/DL (ref 0.2–1.0)
CO2: 34 mmol/L — ABNORMAL HIGH (ref 21–32)
Calcium: 7.9 MG/DL — ABNORMAL LOW (ref 8.5–10.1)
Chloride: 102 mmol/L (ref 97–108)
Creatinine: 0.93 MG/DL (ref 0.55–1.02)
GFR est AA: >60 mL/min/{1.73_m2} (ref >60)
GFR est non-AA: 60 mL/min/{1.73_m2} — ABNORMAL LOW (ref >60)
Globulin: 4.1 g/dL — ABNORMAL HIGH (ref 2.0–4.0)
Glucose: 94 mg/dL (ref 65–100)
Potassium: 3.7 mmol/L (ref 3.5–5.1)
Protein, total: 6.8 g/dL (ref 6.4–8.2)
Sodium: 141 mmol/L (ref 136–145)

## 2017-03-13 LAB — GLUCOSE, POC
Glucose (POC): 112 mg/dL — ABNORMAL HIGH (ref 65–100)
Glucose (POC): 111 mg/dL — ABNORMAL HIGH (ref 65–100)
Glucose (POC): 149 mg/dL — ABNORMAL HIGH (ref 65–100)
Glucose (POC): 99 mg/dL (ref 65–100)

## 2017-03-13 LAB — CBC WITH AUTOMATED DIFF
ABS. BASOPHILS: 0 10*3/uL (ref 0.0–0.1)
ABS. EOSINOPHILS: 0.4 10*3/uL (ref 0.0–0.4)
ABS. IMM. GRANS.: 0 10*3/uL (ref 0.00–0.04)
ABS. LYMPHOCYTES: 0.9 10*3/uL (ref 0.8–3.5)
ABS. MONOCYTES: 0.6 10*3/uL (ref 0.0–1.0)
ABS. NEUTROPHILS: 3.8 10*3/uL (ref 1.8–8.0)
ABSOLUTE NRBC: 0 10*3/uL (ref 0.00–0.01)
BASOPHILS: 1 % (ref 0–1)
EOSINOPHILS: 7 % (ref 0–7)
HCT: 36.2 % (ref 35.0–47.0)
HGB: 10.8 g/dL — ABNORMAL LOW (ref 11.5–16.0)
IMMATURE GRANULOCYTES: 1 % — ABNORMAL HIGH (ref 0.0–0.5)
LYMPHOCYTES: 16 % (ref 12–49)
MCH: 26.9 PG (ref 26.0–34.0)
MCHC: 29.8 g/dL — ABNORMAL LOW (ref 30.0–36.5)
MCV: 90 FL (ref 80.0–99.0)
MONOCYTES: 10 % (ref 5–13)
MPV: 10.9 FL (ref 8.9–12.9)
NEUTROPHILS: 66 % (ref 32–75)
NRBC: 0 PER 100 WBC
PLATELET: 247 10*3/uL (ref 150–400)
RBC: 4.02 M/uL (ref 3.80–5.20)
RDW: 22.1 % — ABNORMAL HIGH (ref 11.5–14.5)
WBC: 5.7 10*3/uL (ref 3.6–11.0)

## 2017-03-13 MED ORDER — METOPROLOL SUCCINATE SR 50 MG 24 HR TAB
50 mg | Freq: Every day | ORAL | Status: DC
Start: 2017-03-13 — End: 2017-03-16
  Administered 2017-03-14 – 2017-03-16 (×3): via ORAL

## 2017-03-13 MED FILL — PRAVASTATIN 10 MG TAB: 10 mg | ORAL | Qty: 2

## 2017-03-13 MED FILL — HEPARIN (PORCINE) 5,000 UNIT/ML IJ SOLN: 5000 unit/mL | INTRAMUSCULAR | Qty: 1

## 2017-03-13 MED FILL — METHOCARBAMOL 500 MG TAB: 500 mg | ORAL | Qty: 1

## 2017-03-13 MED FILL — AMLODIPINE 5 MG TAB: 5 mg | ORAL | Qty: 1

## 2017-03-13 MED FILL — FUROSEMIDE 10 MG/ML IJ SOLN: 10 mg/mL | INTRAMUSCULAR | Qty: 4

## 2017-03-13 MED FILL — ASPIRIN 81 MG TAB, DELAYED RELEASE: 81 mg | ORAL | Qty: 1

## 2017-03-13 MED FILL — TOPROL XL 25 MG TABLET,EXTENDED RELEASE: 25 mg | ORAL | Qty: 1

## 2017-03-13 NOTE — ACP (Advance Care Planning) (Signed)
Dr. Heriberto Antigua and I spoke with patient about completing living will section of AMD. Reviewed Section 2 with patient and she declined to complete this day. Wanted to speak with brother Alden Server about it and get his input.    She has already appointed [employer] as primary MPOA [phone]) and secondary agents are her brothers Alden Server and Candice Camp [phone])

## 2017-03-13 NOTE — Progress Notes (Signed)
PCU SHIFT NURSING NOTE    Bedside shift change report given to Brittney Shelton (Cabin crew) by Chrissie Noa (offgoing nurse). Report included the following information SBAR, Kardex, Intake/Output, MAR, Recent Results and Cardiac Rhythm NSR.          Admission Date 03/11/2017   Admission Diagnosis Acute respiratory failure with hypoxia (HCC)   Consults IP CONSULT TO HOSPITALIST  IP CONSULT TO CARDIOLOGY  IP CONSULT TO PALLIATIVE CARE - PROVIDER        Consults   PT   OT   Speech   Case Management       Palliative      Cardiac Monitoring Order   Yes   No     IV drips   Yes    Drip:                            Dose:  Drip:                            Dose:  Drip:                            Dose:   No     GI Prophylaxis   Yes   No         DVT Prophylaxis   SCDs:             Ted stockings:          Medication   Contraindicated   None      Activity Level Activity Level: Bed Rest     Activity Assistance: Complete care   Purposeful Rounding every 1-2 hour?   Yes   Noreene Filbert Score  Total Score: 3   Bed Alarm (If score 3 or >)   Yes    Refused (See signed refusal form in chart)   Braden Score  Braden Score: 16   Braden Score (if score 14 or less)   PMT consult   Wound Care consult      Specialty bed    Nutrition consult          Needs prior to discharge:   Home O2 required:    Yes   No    If yes, how much O2 required?    Other:    Last Bowel Movement: Last Bowel Movement Date: 03/11/17      Influenza Vaccine Received Flu Vaccine for Current Season (usually Sept-March): No    Patient/Guardian Refused (Notify MD): Yes   Pneumonia Vaccine           Diet Active Orders   Diet    DIET CARDIAC Regular      LDAs               Peripheral IV 03/12/17 Left Antecubital (Active)   Site Assessment Clean, dry, & intact 03/13/2017  7:29 PM   Phlebitis Assessment 0 03/13/2017  7:29 PM   Infiltration Assessment 0 03/13/2017  7:29 PM   Dressing Status Clean, dry, & intact 03/13/2017  7:29 PM    Dressing Type Tape;Transparent 03/13/2017  7:29 PM   Hub Color/Line Status Pink 03/13/2017  7:29 PM   Action Taken Open ports on tubing capped 03/13/2017  7:29 PM   Alcohol Cap Used Yes 03/13/2017  6:00 PM          External Female Catheter 03/12/17 (Active)  Site Assessment Clean, dry, & intact 03/13/2017  7:29 PM   Repositioned No 03/13/2017  7:29 PM   Perineal Care No 03/13/2017  7:29 PM   Wick Changed No 03/13/2017  7:29 PM   Suction Canister/Tubing Changed No 03/13/2017  7:29 PM   Urine Output (mL) 500 ml 03/13/2017  5:03 PM                Urinary Catheter      Intake & Output   Date 03/12/17 1900 - 03/13/17 0659 03/13/17 0700 - 03/14/17 0659   Shift 1900-0659 24 Hour Total 0700-1859 1900-0659 24 Hour Total   I  N  T  A  K  E   P.O. 240 790         P.O. 240 790       Shift Total  (mL/kg) 240  (1.8) 790  (6)      O  U  T  P  U  T   Urine  (mL/kg/hr) 1050 2750 1700  (1.1)  1700      Urine Voided 400 2100         Urine Occurrence(s)   1 x  1 x      Urine Output (mL) (External Female Catheter 03/12/17) [phone]  1700    Shift Total  (mL/kg) 1050  (7.9) 2750  (20.8) 1700  (12.9)  1700  (12.9)   NET -810 -1960 -1700  -1700   Weight (kg) 132.1 132.1 132.1 132.1 132.1         Readmission Risk Assessment Tool Score High Risk            33       Total Score        3 Has Seen PCP in Last 6 Months (Yes=3, No=0)    4 IP Visits Last 12 Months (1-3=4, 4=9, >4=11)    9 Pt. Coverage (Medicare=5 , Medicaid, or Self-Pay=4)    17 Charlson Comorbidity Score (Age + Comorbid Conditions)        Criteria that do not apply:    Married. Living with Significant Other. Assisted Living. LTAC. SNF. or   Rehab    Patient Length of Stay (>5 days = 3)       Expected Length of Stay 4d 2h   Actual Length of Stay 1

## 2017-03-13 NOTE — Progress Notes (Signed)
PCU SHIFT NURSING NOTE    Bedside and Verbal shift change report given to Pecolia Ades, RN (oncoming nurse) by Braulio Conte, RN (offgoing nurse). Report included the following information SBAR, Kardex, ED Summary, Procedure Summary, Intake/Output, MAR, Recent Results, Med Rec Status, Cardiac Rhythm NSR, Alarm Parameters  and Quality Measures.    Shift Summary:         Admission Date 03/11/2017   Admission Diagnosis Acute respiratory failure with hypoxia (HCC)   Consults IP CONSULT TO HOSPITALIST  IP CONSULT TO CARDIOLOGY  IP CONSULT TO PALLIATIVE CARE - PROVIDER        Consults   PT   OT   Speech   Case Management       Palliative      Cardiac Monitoring Order   Yes   No     IV drips   Yes    Drip:                            Dose:  Drip:                            Dose:  Drip:                            Dose:   No     GI Prophylaxis   Yes   No         DVT Prophylaxis   SCDs:             Ted stockings:          Medication   Contraindicated   None      Activity Level Activity Level: Bed Rest     Activity Assistance: Partial (two people)   Purposeful Rounding every 1-2 hour?   Yes   Schmidt Score  Total Score: 3   Bed Alarm (If score 3 or >)   Yes    Refused (See signed refusal form in chart)   Braden Score  Braden Score: 16   Braden Score (if score 14 or less)   PMT consult   Wound Care consult      Specialty bed    Nutrition consult          Needs prior to discharge:   Home O2 required:    Yes   No    If yes, how much O2 required? 4 liters nasal cannula baseline at home    Other:    Last Bowel Movement: Last Bowel Movement Date: 03/11/17      Influenza Vaccine Received Flu Vaccine for Current Season (usually Sept-March): No    Patient/Guardian Refused (Notify MD): Yes   Pneumonia Vaccine           Diet Active Orders   Diet    DIET CARDIAC Regular      LDAs               Peripheral IV 03/12/17 Left Antecubital (Active)    Site Assessment Clean, dry, & intact 03/13/2017 10:00 AM   Phlebitis Assessment 0 03/13/2017 10:00 AM   Infiltration Assessment 0 03/13/2017 10:00 AM   Dressing Status Clean, dry, & intact 03/13/2017 10:00 AM   Dressing Type Tape;Transparent 03/13/2017 10:00 AM   Hub Color/Line Status Pink;Capped;Flushed 03/13/2017 10:00 AM   Action Taken Open ports on tubing capped 03/13/2017 10:00 AM  Alcohol Cap Used Yes 03/13/2017 10:00 AM          External Female Catheter 03/12/17 (Active)   Site Assessment Clean, dry, & intact 03/13/2017 10:00 AM   Repositioned No 03/13/2017 10:00 AM   Perineal Care No 03/13/2017 10:00 AM   Wick Changed No 03/13/2017 10:00 AM   Suction Canister/Tubing Changed No 03/13/2017 10:00 AM   Urine Output (mL) 150 ml 03/13/2017 10:00 AM                Urinary Catheter      Intake & Output   Date 03/12/17 0700 - 03/13/17 0659 03/13/17 0700 - 03/14/17 0659   Shift 0700-1859 1900-0659 24 Hour Total 0700-1859 1900-0659 24 Hour Total   I  N  T  A  K  E   P.O. 550 240 790         P.O. 550 240 790       Shift Total  (mL/kg) 550  (4.1) 240  (1.8) 790  (6)      O  U  T  P  U  T   Urine  (mL/kg/hr) 1700  (1.1) 1050  (0.7) 2750  (0.9) 350  350      Urine Voided 1700 400 2100         Urine Output (mL) (External Female Catheter 03/12/17)  650 650 350  350    Shift Total  (mL/kg) 1700  (12.8) 1050  (7.9) 2750  (20.8) 350  (2.6)  350  (2.6)   NET -1150 -810 -1960 -350  -350   Weight (kg) 132.9 132.1 132.1 132.1 132.1 132.1         Readmission Risk Assessment Tool Score High Risk            33       Total Score        3 Has Seen PCP in Last 6 Months (Yes=3, No=0)    4 IP Visits Last 12 Months (1-3=4, 4=9, >4=11)    9 Pt. Coverage (Medicare=5 , Medicaid, or Self-Pay=4)    17 Charlson Comorbidity Score (Age + Comorbid Conditions)        Criteria that do not apply:    Married. Living with Significant Other. Assisted Living. LTAC. SNF. or   Rehab    Patient Length of Stay (>5 days = 3)        Expected Length of Stay 4d 2h   Actual Length of Stay 1

## 2017-03-13 NOTE — Progress Notes (Signed)
Problem: Pressure Injury - Risk of  Goal: *Prevention of pressure injury  Document Braden Scale and appropriate interventions in the flowsheet.   Outcome: Progressing Towards Goal  Pressure Injury Interventions:  Sensory Interventions: Assess changes in LOC, Assess need for specialty bed, Chair cushion, Discuss PT/OT consult with provider, Keep linens dry and wrinkle-free, Minimize linen layers, Pad between skin to skin, Sit a 90-degree angle/use footstool if needed, Turn and reposition approx. every two hours (pillows and wedges if needed), Use 30-degree side-lying position, Pressure redistribution bed/mattress (bed type), Monitor skin under medical devices, Maintain/enhance activity level, Float heels, Check visual cues for pain, Avoid rigorous massage over bony prominences    Moisture Interventions: Absorbent underpads, Apply protective barrier, creams and emollients, Assess need for specialty bed, Maintain skin hydration (lotion/cream), Offer toileting Q_hr, Moisture barrier, Limit adult briefs, Minimize layers, Internal/External urinary devices, Check for incontinence Q2 hours and as needed    Activity Interventions: Assess need for specialty bed, Chair cushion, Increase time out of bed, PT/OT evaluation, Pressure redistribution bed/mattress(bed type)    Mobility Interventions: Assess need for specialty bed, Chair cushion, HOB 30 degrees or less, PT/OT evaluation, Turn and reposition approx. every two hours(pillow and wedges), Pressure redistribution bed/mattress (bed type), Float heels    Nutrition Interventions: Document food/fluid/supplement intake, Discuss nutritional consult with provider, Offer support with meals,snacks and hydration    Friction and Shear Interventions: Apply protective barrier, creams and emollients, Feet elevated on foot rest, HOB 30 degrees or less, Lift team/patient mobility team, Sit at 90-degree angle, Transferring/repositioning devices, Minimize layers, Lift sheet, Foam  dressings/transparent film/skin sealants               Problem: Falls - Risk of  Goal: *Absence of Falls  Document Schmid Fall Risk and appropriate interventions in the flowsheet.   Outcome: Progressing Towards Goal  Fall Risk Interventions:  Mobility Interventions: Assess mobility with egress test, Bed/chair exit alarm, Patient to call before getting OOB, PT Consult for assist device competence, Utilize walker, cane, or other assistive device, Utilize gait belt for transfers/ambulation, Strengthening exercises (ROM-active/passive), PT Consult for mobility concerns, OT consult for ADLs, Communicate number of staff needed for ambulation/transfer         Medication Interventions: Bed/chair exit alarm, Patient to call before getting OOB, Utilize gait belt for transfers/ambulation, Teach patient to arise slowly, Evaluate medications/consider consulting pharmacy    Elimination Interventions: Bed/chair exit alarm, Call light in reach, Elevated toilet seat, Toileting schedule/hourly rounds, Toilet paper/wipes in reach, Patient to call for help with toileting needs

## 2017-03-13 NOTE — Progress Notes (Signed)
Hospitalist Progress Note  NAME: Brittney Shelton   DOB:  1951-01-31   MRN:  [id number]       Assessment / Plan:  Acute on chronic hypoxic respiratory failure  Acute exacerbation of CHF  -Repeat Echo 10/11, EF 60 % to 65 %. There were no regional wall motion abnormalities, was moderate pulmonary hypertension.  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  ??  COPD  -does not appear in exacerbation, no cough/sputum production-  -cont home anoro ellipta or formulary equiv  ??  DM2  -last A1C on record 11/2016 is 5.8. No meds listed  -SSI   ??  HTN  Cont toprol and amlodipine per home regimen  ??  HLD  -cont home pravastatin  ??  Code Status: Full  Surrogate Decision Maker: Justine Null [phone]  ??  DVT Prophylaxis: sq heparin  GI Prophylaxis: not indicated  ??  Baseline: lives at home with family     Subjective:     Chief Complaint / Reason for Physician Visit  "dyspnea is better".  Discussed with RN events overnight.     Review of Systems:  Symptom Y/N Comments  Symptom Y/N Comments   Fever/Chills n   Chest Pain n    Poor Appetite    Edema     Cough nj   Abdominal Pain n    Sputum    Joint Pain     SOB/DOE y   Pruritis/Rash     Nausea/vomit    Tolerating PT/OT     Diarrhea    Tolerating Diet     Constipation    Other       Could NOT obtain due to:      Objective:     VITALS:   Last 24hrs VS reviewed since prior progress note. Most recent are:  Patient Vitals for the past 24 hrs:   Temp Pulse Resp BP SpO2   03/13/17 1127 98 ??F (36.7 ??C) 88 20 142/85 97 %   03/13/17 0908 - 87 - - -   03/13/17 0708 97.9 ??F (36.6 ??C) 88 20 147/69 94 %    03/13/17 0342 97.9 ??F (36.6 ??C) 79 20 139/69 95 %   03/13/17 0211 - - - - 96 %   03/12/17 2323 98.5 ??F (36.9 ??C) 80 20 126/89 95 %   03/12/17 1827 98.2 ??F (36.8 ??C) 91 20 132/60 91 %   03/12/17 1818 - 85 - 132/56 -   03/12/17 1804 - 83 - 132/56 94 %   03/12/17 1537 - - - - 96 %   03/12/17 1455 98.2 ??F (36.8 ??C) 83 20 114/56 95 %   03/12/17 1450 - 84 - 114/56 -       Intake/Output Summary (Last 24 hours) at 03/13/17 1301  Last data filed at 03/13/17 1127   Gross per 24 hour   Intake              790 ml   Output             2650 ml   Net            -  1860 ml        PHYSICAL EXAM:  General: WD, WN. Alert, cooperative, no acute distress????  EENT:  EOMI. Anicteric sclerae. MMM  Resp:  BL basilar crackle, no wheezing or rales.  No accessory muscle use  CV:  Regular  rhythm,?? No edema  GI:  Soft, Non distended, Non tender. ??+Bowel sounds  Neurologic:?? Alert and oriented X 3, normal speech, at abseline  Psych:???? Good insight.??Not anxious nor agitated  Skin:  No rashes.  No jaundice    Reviewed most current lab test results and cultures  YES  Reviewed most current radiology test results   YES  Review and summation of old records today    NO  Reviewed patient's current orders and MAR    YES  PMH/SH reviewed - no change compared to H&P  ________________________________________________________________________  Care Plan discussed with:    Comments   Patient x    Family      RN x    Care Manager     Consultant                        Multidiciplinary team rounds were held today with case manager, nursing, pharmacist and Higher education careers adviser.  Patient's plan of care was discussed; medications were reviewed and discharge planning was addressed.     ________________________________________________________________________  Total NON critical care TIME:  30   Minutes    Total CRITICAL CARE TIME Spent:   Minutes non procedure based      Comments   >50% of visit spent in counseling and coordination of care      ________________________________________________________________________  Rosalio Macadamia, MD     Procedures: see electronic medical records for all procedures/Xrays and details which were not copied into this note but were reviewed prior to creation of Plan.      LABS:  I reviewed today's most current labs and imaging studies.  Pertinent labs include:  Recent Labs      03/13/17   0344  03/11/17   2000   WBC  5.7  9.7   HGB  10.8*  12.1   HCT  36.2  38.4   PLT  247  247     Recent Labs      03/13/17   0344  03/11/17   2000   NA  141  140   K  3.7  4.0   CL  102  101   CO2  34*  33*   GLU  94  108*   BUN  26*  30*   CREA  0.93  1.22*   CA  7.9*  8.4*   MG   --   2.0   ALB  2.7*  3.2*   TBILI  0.8  1.0   SGOT  9*  15   ALT  8*  8*   INR   --   1.1       Signed: Rosalio Macadamia, MD

## 2017-03-13 NOTE — Progress Notes (Signed)
[address] [place], [place] [address]  [phone]  Cardiology Progress Note      03/13/2017 130 PM    Admit Date: 03/11/2017    Admit Diagnosis:   Acute respiratory failure with hypoxia (HCC)    Subjective:     Brittney Shelton is a 66 y.o. female with PMH COPD, HF, HTN, DM who was admitted for Acute respiratory failure with hypoxia (HCC).    Overnight events:  -VSS; BP slightly elevated  -labs steady  -weight down 2#; I/O neg 2L  -Brittney Shelton states she is still feeling well.  She denies SOB, palpitations, chest pain.  She hopes she can get off the high flow NC soon.      Visit Vitals   ??? BP 142/85 (BP 1 Location: Right arm, BP Patient Position: Sitting)   ??? Pulse 88   ??? Temp 98 ??F (36.7 ??C)   ??? Resp 20   ??? Ht  (1.676 m)   ??? Wt 132.1 kg (291 lb 3.6 oz)   ??? SpO2 97%   ??? BMI 47.01 kg/m2       Current Facility-Administered Medications   Medication Dose Route Frequency   ??? [START ON 03/14/2017] metoprolol succinate (TOPROL-XL) XL tablet 50 mg  50 mg Oral DAILY   ??? pravastatin (PRAVACHOL) tablet 20 mg  20 mg Oral QHS   ??? aspirin delayed-release tablet 81 mg  81 mg Oral DAILY   ??? methocarbamol (ROBAXIN) tablet 500 mg  500 mg Oral QID   ??? umeclidinium-vilanterol (ANORO ELLIPTA) 62.5 mcg- 25 mcg/inhalation  1 Puff Inhalation DAILY   ??? heparin (porcine) injection 5,000 Units  5,000 Units SubCUTAneous Q12H   ??? amLODIPine (NORVASC) tablet 5 mg  5 mg Oral DAILY   ??? furosemide (LASIX) injection 40 mg  40 mg IntraVENous BID       Objective:      Physical Exam:  General: pleasant, obese AAF resting in bed in NAD  Heart: RRR, 3/6 systolic murmur   Lungs: clear/dim   Abdomen: Soft, +BS, NTND   Extremities: LE bil +DP/PT, ? Lymphedema vs obesity.  Chronic appearance and difficult to determine if any edema.   Neurologic: Grossly normal  Skin:?? Warm and dry.??    Data Review:   Recent Labs      03/13/17   0344  03/11/17   2000   WBC  5.7  9.7   HGB  10.8*  12.1   HCT  36.2  38.4   PLT  247  247     Recent Labs       03/13/17   0344  03/11/17   2000   NA  141  140   K  3.7  4.0   CL  102  101   CO2  34*  33*   GLU  94  108*   BUN  26*  30*   CREA  0.93  1.22*   CA  7.9*  8.4*   MG   --   2.0   ALB  2.7*  3.2*   TBILI  0.8  1.0   SGOT  9*  15   ALT  8*  8*   INR   --   1.1       Recent Labs      03/11/17   2000   TROIQ  <0.05   CPK  129   CKMB  <1.0         Intake/Output Summary (Last  24 hours) at 03/13/17 1413  Last data filed at 03/13/17 1127   Gross per 24 hour   Intake              790 ml   Output             2650 ml   Net            -1860 ml        Telemetry:  SR  ECG: read as afib (tele review did not show a fib)    Echocardiogram: EF 60-65%; NWMA; mild TR; mod PHTN   CXRAY: "Study is compromised by patient's body habitus. There is question of a vague patchy density at the right lung base. PA and lateral views are recommended when the patient is stable for further assessment."  CTA chest: "No evidence of acute pulmonary embolus. Unchanged enlargement of the main pulmonary artery, compatible with pulmonary hypertension. Unchanged mediastinal lymphadenopathy. Bilateral indeterminate, ill-defined pulmonary nodules, measuring up to 5 mm; consider follow-up CT in 12 months to assure stability or  resolution. Additional incidental findings as detailed above."    Assessment:     Active Problems:    Acute respiratory failure with hypoxia (HCC) (03/12/2017)        Plan:     Acute respiratory failure with hypoxia:  Patient remains on high-flow nasal canula. CTA neg for PE.  Troponin neg.  proBNP [id number].  Patient has felt at her baseline.  COPD history.    ?? ECHO shows pulmonary hypertension, but no other new concerns  ?? Does not appear heart failure related- no fluid on CTA, patient asymptomatic, weight only slightly up from last office weight in the Spring.         HTN:  BP stable - slightly elevated  ?? Multiple home meds still on hold  ?? Continue lower dose of amlodipine  ?? Slightly increase the dose of toprol         [name], NP  DNP, RN, AGACNP-BC

## 2017-03-13 NOTE — Other (Addendum)
Cardiopulmonary Rehab Nursing Entry:    Pt is on CHF Bundle List    Chart reviewed and pt visited for home CHF instruction.  Pt is a 66 yo admitted with acute on chronic respiratory and acute exacerbation of CHF, EF admission this 60-65%.    Cardiac Rehab: Living with Heart Failure Booklet given to Brittney Shelton.      Educated using teach back method. Discussed diagnosis definition and assessed patient understanding. Reviewed importance of daily weight monitoring and Low Sodium diet (1500-2000 mg. daily).  Encouraged activity and rest periods within symptom limitations and as ordered by physician.  Reviewed the purpose of medication, potential side effects, compliance, and what to do if dose is missed.  Discussed importance of reporting signs and symptoms of exacerbation, and when to report them to the doctor, to prevent re-hospitalization.  Brittney Shelton was encouraged to keep all appointments with doctor. Smoking history assessed.  Patient is a never smoker.     Pt reports that her brother helps her with meal prep and they avoid added salt when cooking and eating, they avoid canned goods as well.  She uses a pillbox for medication compliance, has HH PT/OT and was independent in ADLS PTA.  Pt has scale provided this admission and instruction to weigh daily and to monitor weight gain.      Pt well versed on home care instructions.

## 2017-03-13 NOTE — Progress Notes (Addendum)
PCU SHIFT NURSING NOTE    Bedside and Verbal shift change report given to Brittney Burton, RN (oncoming nurse) by Brittney Sells, RN (offgoing nurse). Report included the following information SBAR, Kardex, ED Summary, Intake/Output, MAR, Recent Results and Cardiac Rhythm NSR.    Shift Summary: 1610    2325: Assessment completed. Pt A&O x 4. VSS. On hi flow NC 35L/min, FiO2 60%. Pt denies complaints @ this time. Purewick in place. Call bell in reach. Will monitor.    0215: Pt mouth breathing while sleeping. O2 sat drops as low as 68%. Placed on 50% ventimask by RT. O2 sat increased to 95% when patient awoken by RN. Denies complaints. Will monitor.    0345: AM labs drawn. VSS. Will monitor.    0710: Bedside and Verbal shift change report given to Brittney Shelton, Charity fundraiser (Cabin crew) by Brittney Burton, RN (offgoing nurse). Report included the following information SBAR, Kardex, ED Summary, Intake/Output, MAR, Recent Results and Cardiac Rhythm NSR.     Admission Date 03/11/2017   Admission Diagnosis Acute respiratory failure with hypoxia (HCC)   Consults IP CONSULT TO HOSPITALIST  IP CONSULT TO CARDIOLOGY  IP CONSULT TO PALLIATIVE CARE - PROVIDER        Consults   PT   OT   Speech   Case Management       Palliative      Cardiac Monitoring Order   Yes   No     IV drips   Yes    Drip:                            Dose:  Drip:                            Dose:  Drip:                            Dose:   No     GI Prophylaxis   Yes   No         DVT Prophylaxis   SCDs:             Ted stockings:          Medication   Contraindicated   None      Activity Level Activity Level: Bed Rest     Activity Assistance: Partial (two people)   Purposeful Rounding every 1-2 hour?   Yes   Schmidt Score  Total Score: 2   Bed Alarm (If score 3 or >)   Yes    Refused (See signed refusal form in chart)   Braden Score  Braden Score: 14   Braden Score (if score 14 or less)   PMT consult   Wound Care consult      Specialty bed     Nutrition consult          Needs prior to discharge:   Home O2 required:    Yes   No    If yes, how much O2 required? 4L NC    Other:    Last Bowel Movement: Last Bowel Movement Date: 03/11/17      Influenza Vaccine Received Flu Vaccine for Current Season (usually Sept-March): No    Patient/Guardian Refused (Notify MD): Yes   Pneumonia Vaccine           Diet Active Orders   Diet  DIET CARDIAC Regular      LDAs               Peripheral IV 03/12/17 Left Antecubital (Active)   Site Assessment Clean, dry, & intact 03/12/2017 11:23 PM   Phlebitis Assessment 0 03/12/2017 11:23 PM   Infiltration Assessment 0 03/12/2017 11:23 PM   Dressing Status Clean, dry, & intact 03/12/2017 11:23 PM   Dressing Type Transparent;Tape 03/12/2017 11:23 PM   Hub Color/Line Status Pink;Capped 03/12/2017 11:23 PM   Action Taken Open ports on tubing capped 03/12/2017  2:55 PM   Alcohol Cap Used Yes 03/12/2017  2:55 PM                      Urinary Catheter      Intake & Output   Date 03/12/17 0700 - 03/13/17 0659 03/13/17 0700 - 03/14/17 0659   Shift 0700-1859 1900-0659 24 Hour Total 0700-1859 1900-0659 24 Hour Total   I  N  T  A  K  E   P.O. 550  550         P.O. 550  550       Shift Total  (mL/kg) 550  (4.1)  550  (4.1)      O  U  T  P  U  T   Urine  (mL/kg/hr) 1700  (1.1) 400 2100         Urine Voided 1700 400 2100       Shift Total  (mL/kg) 1700  (12.8) 400  (3) 2100  (15.8)      NET -1150 -400 -1550      Weight (kg) 132.9 132.9 132.9 132.9 132.9 132.9         Readmission Risk Assessment Tool Score High Risk            33       Total Score        3 Has Seen PCP in Last 6 Months (Yes=3, No=0)    4 IP Visits Last 12 Months (1-3=4, 4=9, >4=11)    9 Pt. Coverage (Medicare=5 , Medicaid, or Self-Pay=4)    17 Charlson Comorbidity Score (Age + Comorbid Conditions)        Criteria that do not apply:    Married. Living with Significant Other. Assisted Living. LTAC. SNF. or   Rehab    Patient Length of Stay (>5 days = 3)        Expected Length of Stay 4d 2h   Actual Length of Stay 1

## 2017-03-13 NOTE — Consults (Signed)
Palliative Medicine Consult  [place]: [phone] [phone])  Patient Name: Brittney Shelton  Date of Birth: 02-21-1951    Date of Initial Consult: 03/12/17  Reason for Consult: care decisions/ bundle patient   Requesting Provider: Trula Slade MD  Primary Care Physician: Ermalene Postin, MD     SUMMARY:   Brittney Shelton is a 66 y.o. 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  She  was admitted on 03/11/2017 from home  with a diagnosis of hypoxia despite on 4 litre of oxygen and and back pain .     Current medical issues leading to Palliative Medicine involvement include:  Diastolic CHF  and pulmonary hyperetension , COPD,  Currently full code , needs  Health care instruction/advance directives ,  has completed MPOA, documents,  HF bundle patient , care decisions.    Lives with his brother in one story house ,  got out of rehab yesterday, was able to  Walk with walker and needed assistance with Adls , single no children .       PALLIATIVE DIAGNOSES:   1. Advance directives   2. Chronic back pain /spasm, without sciatica  3. Sob /acute exacerbation of CHF/pul hypetrtension   4.  Morbid Obesity        PLAN:   1. Met with the patient along with lSCW Alvina Filbert ( refer to her note).  2. Today patient notes pain is better, she deneis any complaints .  3. Advance directives :We followed up on completing the living will portion of advance directives,  patient wants to discuss with her brother this afternoon , left empty copy , patient current advnace directive reflects , Bronwen Betters as primary MPOA [phone]) and secondary agents are her brothers Jaquelyn Bitter and Harlon Ditty [phone]), she does not want to change any .   4. Resuscitation orders /DDNR  : she confirms in patient DNR order not to attempt CPR if her heart stops .       In addition  No intubation for resp distress out side end of life , YES for CPAP and Bipap, YES for vasopressors . Discussed importance of continuation of DNAR orders outside the hospital explained  DDNR , she declined  , wants to discuss with her brother , who is visiting this afternoon , empty DDNR left with her . We agreed to follow up on Monday .  5. Chronic back pain / spasm due to DJD : better continue robaxin , tylenol and work up /Mri if not better.  6. initial consult note routed to primary continuity provider  7. Communicated plan of care with: Palliative IDT       GOALS OF CARE / TREATMENT PREFERENCES:     GOALS OF CARE:  Patient/Health Care Proxy Stated Goals: Rehabilitation      TREATMENT PREFERENCES:   Code Status: DNR    Advance Care Planning:  Advance Care Planning 03/13/2017   Patient's Healthcare Decision Maker is: Named in scanned ACP document   Primary Decision Maker Name Bronwen Betters   Primary Decision Maker Phone Number [phone]   Primary Decision Maker Relationship to Patient Other  relative   Secondary Decision Maker Name Salome Arnt and Harlon Ditty    Secondary Decision Maker Phone Number [phone]   Secondary Decision Maker Relationship to Patient -   Confirm Advance Directive Yes, on file   Patient Would Like to Complete Advance Directive -       Medical Interventions: Limited additional interventions   Other Instructions:         Other:    As far as possible, the palliative care team has discussed with patient / health care proxy about goals of care / treatment preferences for patient.           HISTORY:     History obtained from: chart and patient .    CHIEF COMPLAINT: sob and back pain .  HPI/SUBJECTIVE:    The patient is:   '[]'  Verbal and participatory  Patient reports doing well at reahb and progressed in function to walk  with walker  , she is satisfied with [facility] facility , at 5: 30 am today she got up to go to bathroom , felt excruciating pain in back and sob , came to ER.     today her back spasms are better , atleast she is able to twist and turn in bed , aleve helps with back pain along with Robaxin , some days are good without back pain , however she always have some  pain " left knee pain " due to severe arthritis , may need joint replacement soon .   She uses biofreeze cream for the knee, helps with pain .    Currently she reports back pain /spasm 8/10, currently on high flow oxygen .    She denies any nausea , vomiting or constipation .    03/13/17 : patient is currently in recliner , notes she is over all feeling " better " back pain is better "    Denies any nausea , vomiting , cough , fever , bowels are moving .      Clinical Pain Assessment (nonverbal scale for severity on nonverbal patients):   Clinical Pain Assessment  Severity: 6  Location: lower back  Character: spasm   Duration: years  Effect: function   Factors: none   Frequency: comes and go           Duration: for how long has pt been experiencing pain (e.g., 2 days, 1 month, years)  Frequency: how often pain is an issue (e.g., several times per day, once every few days, constant)     FUNCTIONAL ASSESSMENT:     Palliative Performance Scale (PPS):  PPS: 40       PSYCHOSOCIAL/SPIRITUAL SCREENING:     Palliative IDT has assessed this patient for cultural preferences / practices and a referral made as appropriate to needs (Cultural Services, Patient Advocacy, Ethics, etc.)    Advance Care Planning:  Advance Care Planning 03/13/2017   Patient's Healthcare Decision Maker is: Named in scanned ACP document   Primary Decision Maker Name Bronwen Betters   Primary Decision Maker Phone Number [phone]   Primary Decision Maker Relationship to Patient Other relative   Secondary Decision Maker Name Salome Arnt and [place] Phone Number [phone]   Secondary Decision Maker Relationship to Patient -   Confirm Advance Directive Yes, on file   Patient Would Like to Complete Advance Directive -       Any spiritual / religious concerns:  '[]'  Yes /  '[]'  No  Caregiver Burnout:  '[]'  Yes /  '[]'  No /  '[]'  No Caregiver Present      Anticipatory grief assessment:   '[]'  Normal  / '[]'  Maladaptive       ESAS Anxiety: Anxiety: 0    ESAS Depression: Depression: 0        REVIEW OF SYSTEMS:     Positive and pertinent negative findings in ROS are noted above in HPI.  The following systems were '[]'  reviewed / '[]'  unable to be reviewed as noted in HPI  Other findings are noted below.  Systems: constitutional, ears/nose/mouth/throat, respiratory, gastrointestinal, genitourinary, musculoskeletal, integumentary, neurologic, psychiatric, endocrine. Positive findings noted below.  Modified ESAS Completed by: provider   Fatigue: 7 Drowsiness: 0   Depression: 0 Pain: 6   Anxiety: 0 Nausea: 0   Anorexia: 2 Dyspnea: 4     Constipation: No              PHYSICAL EXAM:     From RN flowsheet:  Wt Readings from Last 3 Encounters:   03/14/17 292 lb 15.9 oz (132.9 kg)   01/06/17 298 lb (135.2 kg)   12/17/16 315 lb (142.9 kg)     Blood pressure 127/72, pulse 78, temperature 98.8 ??F (37.1 ??C), resp. rate 20, height '5\' 6"'  (1.676 m), weight 292 lb 15.9 oz (132.9 kg), SpO2 93 %.    Pain Scale 1: (P) Numeric (0 - 10)  Pain Intensity 1: (P) 0  Pain Onset 1: chronic  Pain Location 1: Back, Leg  Pain Orientation 1: Mid  Pain Description 1: Aching  Pain Intervention(s) 1: Relaxation technique, Repositioned, Rest  Last bowel movement, if known:     Constitutional: morbid  obese , alert oriented on high flow , siting in recliner, not in any distress.  Eyes: pupils equal, anicteric  ENMT: no nasal discharge, moist mucous membranes  Cardiovascular: regular rhythm.  Respiratory: breathing not labored, symmetric  Gastrointestinal: soft non-tender, +bowel sounds   Musculoskeletal: no deformity, no tenderness to palpation, bilateral edema of legs / morbid obese.  Skin: warm, dry  Neurologic: following commands, moving all extremities  Psychiatric: full affect, no hallucinations  Other:       HISTORY:     Active Problems:    Acute respiratory failure with hypoxia ([place]) (03/12/2017)      Past Medical History:   Diagnosis Date   ??? Chronic obstructive pulmonary disease ([place])    ??? Congestive heart failure ([place])    ??? Diabetes ([place])    ??? Hypertension       History reviewed. No pertinent surgical history.   Family History   Problem Relation Age of Onset   ??? Diabetes Mother    ??? Arthritis-osteo Father    ??? COPD Sister    ??? Diabetes Brother    ??? Diabetes Brother       History reviewed, no pertinent family history.  Social History   Substance Use Topics   ??? Smoking status: Never Smoker   ??? Smokeless tobacco: Never Used   ??? Alcohol use No     Allergies   Allergen Reactions   ??? Lisinopril Angioedema      Current Facility-Administered Medications   Medication Dose Route Frequency   ??? methylPREDNISolone (PF) (SOLU-MEDROL) injection 125 mg  125 mg IntraVENous ONCE   ??? [START ON 03/15/2017] methylPREDNISolone (PF) (SOLU-MEDROL) injection 60 mg  60 mg IntraVENous Q6H   ??? [START ON 03/15/2017] furosemide (LASIX) tablet 40 mg  40 mg Oral BID   ???  albuterol-ipratropium (DUO-NEB) 2.5 MG-0.5 MG/3 ML  3 mL Nebulization Q6H RT   ??? acetylcysteine (MUCOMYST) 200 mg/mL (20 %) solution 600 mg  600 mg Oral BID   ??? metoprolol succinate (TOPROL-XL) XL tablet 50 mg  50 mg Oral DAILY   ??? pravastatin (PRAVACHOL) tablet 20 mg  20 mg Oral QHS   ??? aspirin delayed-release tablet 81 mg  81 mg Oral DAILY   ??? methocarbamol (ROBAXIN) tablet 500 mg  500 mg Oral QID   ??? umeclidinium-vilanterol (ANORO ELLIPTA) 62.5 mcg- 25 mcg/inhalation  1 Puff Inhalation DAILY   ??? heparin (porcine) injection 5,000 Units  5,000 Units SubCUTAneous Q12H   ??? amLODIPine (NORVASC) tablet 5 mg  5 mg Oral DAILY           LAB AND IMAGING FINDINGS:     Lab Results   Component Value Date/Time    WBC 5.5 03/14/2017 03:41 AM    HGB 11.1 (L) 03/14/2017 03:41 AM    PLATELET 242 03/14/2017 03:41 AM     Lab Results   Component Value Date/Time    Sodium 140 03/14/2017 03:41 AM    Potassium 3.8 03/14/2017 03:41 AM    Chloride 100 03/14/2017 03:41 AM    CO2 36 (H) 03/14/2017 03:41 AM    BUN 22 (H) 03/14/2017 03:41 AM    Creatinine 0.78 03/14/2017 03:41 AM    Calcium 7.9 (L) 03/14/2017 03:41 AM    Magnesium 1.8 03/14/2017 03:41 AM    Phosphorus 4.1 03/14/2017 03:41 AM      Lab Results   Component Value Date/Time    AST (SGOT) 9 (L) 03/13/2017 03:44 AM    Alk. phosphatase 45 03/13/2017 03:44 AM    Protein, total 6.8 03/13/2017 03:44 AM    Albumin 2.7 (L) 03/13/2017 03:44 AM    Globulin 4.1 (H) 03/13/2017 03:44 AM     Lab Results   Component Value Date/Time    INR 1.1 03/11/2017 08:00 PM    Prothrombin time 11.1 03/11/2017 08:00 PM    aPTT 29.4 03/11/2017 08:00 PM      No results found for: IRON, FE, TIBC, IBCT, PSAT, FERR   Lab Results   Component Value Date/Time    pH 7.48 (H) 03/11/2017 08:30 PM    PCO2 46 (H) 03/11/2017 08:30 PM    PO2 46 (LL) 03/11/2017 08:30 PM     No components found for: [facility]   Lab Results   Component Value Date/Time    CK 129 03/11/2017 08:00 PM    CK - MB <1.0 03/11/2017 08:00 PM                Total time:   Counseling / coordination time, spent as noted above:   > 50% counseling / coordination?:     Prolonged service was provided for  '[]' 30 min   '[]' 75 min in face to face time in the presence of the patient, spent as noted above.  Time Start:   Time End:   Note: this can only be billed with [phone] (initial) or [phone] (follow up).  If multiple start / stop times, list each separately.

## 2017-03-13 NOTE — Progress Notes (Signed)
Problem: Self Care Deficits Care Plan (Adult)  Goal: *Acute Goals and Plan of Care (Insert Text)  Occupational Therapy Goals  Initiated 03/12/2017    1.  Patient will perform simple grooming tasks in full bed/chair position for at least 5 minutes within 7 days.  2.  Patient will participate in anterior bathing with Min A from seated position within 7 days.  3.  Patient will perform rolling in bed with Mod A x1 in preparation for toileting and hygiene within 7 days.  4.  Patient will perform toilet transfer with overall Mod A x2 and least restrictive device PRN within 7 days.  5.  Patient will participate in all aspects of toileting with overall Mod A x1 within 7 days.  6.  Patient will complete upper body therapeutic exercises/activities to maximize independence and safety with ADLs for at least 5 minutes within 7 days.   Occupational Therapy TREATMENT  Patient: Brittney Shelton [phone] y.o. female)  Date: 03/13/2017  Diagnosis: Acute respiratory failure with hypoxia (HCC) <principal problem not specified>       Precautions:   fall; monitor O2  Chart, occupational therapy assessment, plan of care, and goals were reviewed.    ASSESSMENT:  Pt was supine upon arrival and was on high flow O2 at 20 liters high flow with O2 rate of 73.4%.  Pt was cleared for session.  Pt is well known to me from previous admit and desats into the mid 80's at baseline on 4 liters NC 24/7.  She reported back pain that improved over time with mobility.  Pt performed supine to sit edge of bed with use of bed rails with increased time and CGA to min assist.  Pt changed gown seated with set up.  Min assist for sit to stand from edge of bed and to stand pivot transfer to bedside recliner chair.  O2 sat at the lowest was 84% with activity and the highest was 93%. Limited session today due to O2 needs.  Recommend SNF for rehab at discharge.  Progression toward goals:         Improving appropriately and progressing toward goals          Improving slowly and progressing toward goals         Not making progress toward goals and plan of care will be adjusted     PLAN:  Patient continues to benefit from skilled intervention to address the above impairments.  Continue treatment per established plan of care.  Discharge Recommendations:  Skilled Nursing Facility  Further Equipment Recommendations for Discharge:  TBD     SUBJECTIVE:   Patient stated ???I want to get back home. I  Need to get ready for christmas shopping.???    OBJECTIVE DATA SUMMARY:   Cognitive/Behavioral Status:  Neurologic State: Alert  Orientation Level: Oriented X4  Cognition: Appropriate decision making;Appropriate for age attention/concentration;Appropriate safety awareness  Perception: Appears intact  Perseveration: No perseveration noted  Safety/Judgement: Awareness of environment;Fall prevention  Functional Mobility and Transfers for ADLs:Bed Mobility:  Supine to Sit: Minimum assistance;Additional time;Contact guard assistance    Transfers:  Sit to Stand: Contact guard assistance     Bed to Chair: Contact guard assistance;Assist x2    Balance:  Sitting: Intact  Standing: Impaired  Standing - Static: Fair;Constant support  Standing - Dynamic : Fair  ADL Intervention:                      Upper Body Dressing Assistance  Hospital Gown: Supervision/ set-up (seated edge of bed)              Cognitive Retraining  Safety/Judgement: Awareness of environment;Fall prevention  Pain:  Pain Scale 1: Numeric (0 - 10)  Pain Intensity 1: 8  Pain Location 1: Back;Leg  Pain Orientation 1: Mid  Pain Description 1: Aching  Pain Intervention(s) 1: Relaxation technique;Repositioned;Rest  Activity Tolerance:     Please refer to the flowsheet for vital signs taken during this treatment.  After treatment:    Patient left in no apparent distress sitting up in chair   Patient left in no apparent distress in bed   Call bell left within reach   Nursing notified   Caregiver present    Bed alarm activated    COMMUNICATION/COLLABORATION:   The patient???s plan of care was discussed with: Physical Therapist, Registered Nurse and patient    Arlys John, OTR/L  Time Calculation: 29 mins

## 2017-03-13 NOTE — Progress Notes (Signed)
Palliative Medicine Social Work    Dr. Maud Deed and I met with Brittney Shelton this afternoon. She was reclining in her bedside chair breathing through breathing machine. She did not report pain and said she couldn't complain about anything. Discussed her strong faith and prayers for strength. She believes God supports her and fixes in his own time and way.    Discussed with patient about completing living will section of AMD. She has already appointed Brittney Shelton as primary [employer] [phone]) and secondary agents are her brothers Brittney Shelton and Brittney Shelton [phone]).  Reviewed Section 2 with patient and she declined to complete this day. Wanted to speak with brother Brittney Shelton about it and get his input. She states that she is the "boss" of her care.    Will follow up Monday to complete forms.          Thank you for the opportunity to be involved in the care of Brittney Shelton.    Alvina Filbert, MSW  Palliative Medicine Social Worker  [phone]

## 2017-03-13 NOTE — Progress Notes (Signed)
Pharmacy Medication Reconciliation     The patient was interviewed regarding current PTA medication list, use and drug allergies;  Pt present in room and obtained permission from patient to discuss drug regimen with visitor(s) present. The patient was questioned regarding use of any other inhalers, topical products, over the counter medications, herbal medications, vitamin products or ophthalmic/nasal/otic medication use.     Allergy Update: none    Recommendations/Findings:   The following amendments were made to the patient's active medication list on file at [facility]:   1) Additions: lisinopril    2) Deletions: Tylenol 3, DUO-NEB, Cholecalciferol, Diphenhydramine, NORCO, Robaxin, Zanaflex, Prednisone    3) Changes: lasix BID-TID, Anoro takes PRN      -Clarified PTA med list with Pt and Rx Query. PTA medication list was corrected to the following:     Prior to Admission Medications   Prescriptions Last Dose Informant Patient Reported? Taking?   amLODIPine (NORVASC) 10 mg tablet 03/11/2017 at 0800 Self No Yes   Sig: TAKE 1 TABLET BY MOUTH DAILY   aspirin delayed-release 81 mg tablet 03/11/2017 at 0800 Self Yes Yes   Sig: Take 81 mg by mouth daily.   cloNIDine HCl (CATAPRES) 0.1 mg tablet 03/11/2017 at 0800 Self No Yes   Sig: TAKE 1 TABLET BY MOUTH ONCE DAILY   furosemide (LASIX) 40 mg tablet  Self Yes Yes   Sig: Take 40 mg by mouth three (3) times daily.   hydrALAZINE (APRESOLINE) 10 mg tablet 03/10/2017 at Unknown Self No No   Sig: Take 1 Tab by mouth three (3) times daily.   isosorbide dinitrate (ISORDIL) 30 mg tablet 03/05/2017 at Unknown time Self No Yes   Sig: TAKE 1 TABLET BY MOUTH THREE TIMES DAILY   lisinopril (PRINIVIL, ZESTRIL) 40 mg tablet  Self Yes Yes   Sig: Take 40 mg by mouth daily.   metoprolol succinate (TOPROL-XL) 200 mg XL tablet 03/05/2017 at Unknown time Self No Yes   Sig: TAKE 1 TABLET BY MOUTH ONCE DAILY FOR HIGH BLOOD PRESSURE    naproxen sodium (ALEVE) 220 mg tablet Not Taking at Unknown time Self Yes No   Sig: Take 440 mg by mouth every twelve (12) hours as needed for Pain.   pravastatin (PRAVACHOL) 20 mg tablet 03/05/2017 at Unknown time Self No Yes   Sig: TAKE 1 TABLET BY MOUTH DAILY AT NIGHT   umeclidinium-vilanterol (ANORO ELLIPTA) 62.5-25 mcg/actuation inhaler  Self Yes Yes   Sig: Take 1 Puff by inhalation as needed.      Facility-Administered Medications: None          Thank you,  Riki Altes D Student

## 2017-03-13 NOTE — Progress Notes (Signed)
Problem: Pressure Injury - Risk of  Goal: *Prevention of pressure injury  Document Braden Scale and appropriate interventions in the flowsheet.   Outcome: Progressing Towards Goal  Pressure Injury Interventions:       Moisture Interventions: Absorbent underpads, Internal/External urinary devices, Maintain skin hydration (lotion/cream), Moisture barrier    Activity Interventions: Assess need for specialty bed, Increase time out of bed, Pressure redistribution bed/mattress(bed type), PT/OT evaluation    Mobility Interventions: Assess need for specialty bed, Float heels, HOB 30 degrees or less, Pressure redistribution bed/mattress (bed type), PT/OT evaluation, Turn and reposition approx. every two hours(pillow and wedges)    Nutrition Interventions: Document food/fluid/supplement intake    Friction and Shear Interventions: Apply protective barrier, creams and emollients, HOB 30 degrees or less, Lift sheet, Lift team/patient mobility team

## 2017-03-13 NOTE — Progress Notes (Signed)
Problem: Falls - Risk of  Goal: *Absence of Falls  Document Schmid Fall Risk and appropriate interventions in the flowsheet.   Outcome: Progressing Towards Goal  Fall Risk Interventions:            Medication Interventions: Bed/chair exit alarm, Patient to call before getting OOB    Elimination Interventions: Bed/chair exit alarm, Call light in reach, Patient to call for help with toileting needs, Toileting schedule/hourly rounds

## 2017-03-13 NOTE — Progress Notes (Signed)
Problem: Mobility Impaired (Adult and Pediatric)  Goal: *Acute Goals and Plan of Care (Insert Text)  Physical Therapy Goals  Initiated 03/12/2017  1.  Patient will move from supine to sit and sit to supine  in bed with modified independence within 7 day(s).    2.  Patient will transfer from bed to chair and chair to bed with modified independence using the least restrictive device within 7 day(s).  3.  Patient will perform sit to stand with modified independence within 7 day(s).  4.  Patient will ambulate with supervision/set-up for 50 feet with the least restrictive device within 7 day(s).   5.  Patient will ascend/descend 3 stairs with 1 handrail(s) with minimal assistance/contact guard assist within 7 day(s).   physical Therapy TREATMENT  Patient: Brittney Shelton [phone] y.o. female)  Date: 03/13/2017  Diagnosis: Acute respiratory failure with hypoxia (HCC) <principal problem not specified>       Precautions:    Chart, physical therapy assessment, plan of care and goals were reviewed.    ASSESSMENT:  Patient with improvement in mobility today.  O2 sats 94% at rest prior to mobility.  Patient on high flow nasal cannula, 30% throughout treatment.  Bed mobility with min assist x 1, CGA.  Patient with pain in left knee with knee flexion during bed mobility, as well as some left low back pain.  Good sitting balance EOB.  Sit to stand with CGA/min assist x 1.  Patient ambulates short distance from bed to chair with CGA.  Sats 84-85% after ambulating, while adjusting equipment.  Sats increase to 93% after rest.  Up in recliner with LE's elevated at end of session.  Encouraged patient to sit up for meals over weekend.  Recommend SNF at discharge, although patient reluctant due to recent prolonged stay at [facility].  Progression toward goals:      Improving appropriately and progressing toward goals      Improving slowly and progressing toward goals       Not making progress toward goals and plan of care will be adjusted     PLAN:  Patient continues to benefit from skilled intervention to address the above impairments.  Continue treatment per established plan of care.  Discharge Recommendations:  Skilled Nursing Facility  Further Equipment Recommendations for Discharge:  TBD     SUBJECTIVE:   Patient stated ???I'll try.???    OBJECTIVE DATA SUMMARY:   Critical Behavior:  Neurologic State: Alert  Orientation Level: Oriented X4  Cognition: Appropriate decision making, Follows commands  Safety/Judgement: Awareness of environment, Insight into deficits  Functional Mobility Training:  Bed Mobility:     Supine to Sit: Minimum assistance;Additional time;Contact guard assistance              Transfers:  Sit to Stand: Contact guard assistance  Stand to Sit: Contact guard assistance        Bed to Chair: Contact guard assistance;Assist x2                    Balance:  Sitting: Intact  Standing: Impaired  Standing - Static: Fair;Constant support  Standing - Dynamic : FairAmbulation/Gait Training:  Distance (ft): 4 Feet (ft)  Assistive Device: Gait belt;Walker, rolling  Ambulation - Level of Assistance: Contact guard assistance        Gait Abnormalities: Decreased step clearance              Speed/Cadence: Slow  Step Length: Left shortened;Right shortened  Therapeutic Exercises:   Encouraged LE exercises while up in chair.  Pain:  Pain Scale 1: Numeric (0 - 10)  Pain Intensity 1: 8  Pain Location 1: Back;Leg  Pain Orientation 1: Mid  Pain Description 1: Aching  Pain Intervention(s) 1: Relaxation technique;Repositioned;Rest  Activity Tolerance:   improving  Please refer to the flowsheet for vital signs taken during this treatment.  After treatment:       Patient left in no apparent distress sitting up in chair      Patient left in no apparent distress in bed      Call bell left within reach      Nursing notified      Caregiver present       Bed alarm activated    COMMUNICATION/COLLABORATION:   The patient???s plan of care was discussed with: Occupational Therapist and Registered Nurse    Garen Lah, PT   Time Calculation: 33 mins

## 2017-03-14 LAB — METABOLIC PANEL, BASIC
Anion gap: 4 mmol/L — ABNORMAL LOW (ref 5–15)
BUN/Creatinine ratio: 28 — ABNORMAL HIGH (ref 12–20)
BUN: 22 MG/DL — ABNORMAL HIGH (ref 6–20)
CO2: 36 mmol/L — ABNORMAL HIGH (ref 21–32)
Calcium: 7.9 MG/DL — ABNORMAL LOW (ref 8.5–10.1)
Chloride: 100 mmol/L (ref 97–108)
Creatinine: 0.78 MG/DL (ref 0.55–1.02)
GFR est AA: >60 mL/min/{1.73_m2} (ref >60)
GFR est non-AA: >60 mL/min/{1.73_m2} (ref >60)
Glucose: 97 mg/dL (ref 65–100)
Potassium: 3.8 mmol/L (ref 3.5–5.1)
Sodium: 140 mmol/L (ref 136–145)

## 2017-03-14 LAB — CBC W/O DIFF
ABSOLUTE NRBC: 0 10*3/uL (ref 0.00–0.01)
HCT: 36 % (ref 35.0–47.0)
HGB: 11.1 g/dL — ABNORMAL LOW (ref 11.5–16.0)
MCH: 27.4 PG (ref 26.0–34.0)
MCHC: 30.8 g/dL (ref 30.0–36.5)
MCV: 88.9 FL (ref 80.0–99.0)
MPV: 10.5 FL (ref 8.9–12.9)
NRBC: 0 PER 100 WBC
PLATELET: 242 10*3/uL (ref 150–400)
RBC: 4.05 M/uL (ref 3.80–5.20)
RDW: 21.1 % — ABNORMAL HIGH (ref 11.5–14.5)
WBC: 5.5 10*3/uL (ref 3.6–11.0)

## 2017-03-14 LAB — GLUCOSE, POC
Glucose (POC): 152 mg/dL — ABNORMAL HIGH (ref 65–100)
Glucose (POC): 96 mg/dL (ref 65–100)
Glucose (POC): 95 mg/dL (ref 65–100)
Glucose (POC): 150 mg/dL — ABNORMAL HIGH (ref 65–100)

## 2017-03-14 LAB — MAGNESIUM: Magnesium: 1.8 mg/dL (ref 1.6–2.4)

## 2017-03-14 LAB — PHOSPHORUS: Phosphorus: 4.1 MG/DL (ref 2.6–4.7)

## 2017-03-14 MED ORDER — ENOXAPARIN 40 MG/0.4 ML SUB-Q SYRINGE
40 mg/0.4 mL | Freq: Two times a day (BID) | SUBCUTANEOUS | Status: DC
Start: 2017-03-14 — End: 2017-04-04
  Administered 2017-03-14 – 2017-04-04 (×42): via SUBCUTANEOUS

## 2017-03-14 MED ORDER — ACETYLCYSTEINE 20 % (200 MG/ML) SOLN
200 mg/mL (20 %) | Freq: Two times a day (BID) | Status: DC
Start: 2017-03-14 — End: 2017-03-27
  Administered 2017-03-14 – 2017-03-27 (×25): via ORAL

## 2017-03-14 MED ORDER — METHYLPREDNISOLONE (PF) 125 MG/2 ML IJ SOLR
125 mg/2 mL | Freq: Once | INTRAMUSCULAR | Status: AC
Start: 2017-03-14 — End: 2017-03-14
  Administered 2017-03-14: 17:00:00 via INTRAVENOUS

## 2017-03-14 MED ORDER — ACETYLCYSTEINE 20 % (200 MG/ML) SOLN
200 mg/mL (20 %) | Freq: Two times a day (BID) | Status: DC
Start: 2017-03-14 — End: 2017-03-14

## 2017-03-14 MED ORDER — IPRATROPIUM-ALBUTEROL 2.5 MG-0.5 MG/3 ML NEB SOLUTION
2.5 mg-0.5 mg/3 ml | Freq: Four times a day (QID) | RESPIRATORY_TRACT | Status: DC
Start: 2017-03-14 — End: 2017-03-18
  Administered 2017-03-14 – 2017-03-18 (×16): via RESPIRATORY_TRACT

## 2017-03-14 MED ORDER — FUROSEMIDE 40 MG TAB
40 mg | Freq: Two times a day (BID) | ORAL | Status: DC
Start: 2017-03-14 — End: 2017-03-16
  Administered 2017-03-15 – 2017-03-16 (×3): via ORAL

## 2017-03-14 MED ORDER — IPRATROPIUM-ALBUTEROL 2.5 MG-0.5 MG/3 ML NEB SOLUTION
2.5 mg-0.5 mg/3 ml | RESPIRATORY_TRACT | Status: DC
Start: 2017-03-14 — End: 2017-03-14

## 2017-03-14 MED ORDER — HYDROCODONE-ACETAMINOPHEN 5 MG-325 MG TAB
5-325 mg | Freq: Four times a day (QID) | ORAL | Status: DC | PRN
Start: 2017-03-14 — End: 2017-04-04
  Administered 2017-03-14 – 2017-04-04 (×38): via ORAL

## 2017-03-14 MED ORDER — METHYLPREDNISOLONE (PF) 125 MG/2 ML IJ SOLR
125 mg/2 mL | Freq: Four times a day (QID) | INTRAMUSCULAR | Status: DC
Start: 2017-03-14 — End: 2017-03-16
  Administered 2017-03-15 – 2017-03-16 (×7): via INTRAVENOUS

## 2017-03-14 MED ORDER — LEVOFLOXACIN 500 MG TAB
500 mg | ORAL | Status: AC
Start: 2017-03-14 — End: 2017-03-18
  Administered 2017-03-15 – 2017-03-19 (×5): via ORAL

## 2017-03-14 MED ORDER — MORPHINE 10 MG/5 ML ORAL SOLN
10 mg/5 mL | Freq: Once | ORAL | Status: AC
Start: 2017-03-14 — End: 2017-03-14
  Administered 2017-03-14: 19:00:00 via ORAL

## 2017-03-14 MED ORDER — ACETAMINOPHEN 325 MG TABLET
325 mg | Freq: Four times a day (QID) | ORAL | Status: DC | PRN
Start: 2017-03-14 — End: 2017-04-04
  Administered 2017-03-23 – 2017-04-04 (×13): via ORAL

## 2017-03-14 MED FILL — TOPROL XL 50 MG TABLET,EXTENDED RELEASE: 50 mg | ORAL | Qty: 1

## 2017-03-14 MED FILL — AMLODIPINE 5 MG TAB: 5 mg | ORAL | Qty: 1

## 2017-03-14 MED FILL — METHOCARBAMOL 500 MG TAB: 500 mg | ORAL | Qty: 1

## 2017-03-14 MED FILL — PRAVASTATIN 10 MG TAB: 10 mg | ORAL | Qty: 2

## 2017-03-14 MED FILL — MORPHINE 10 MG/5 ML ORAL SOLN: 10 mg/5 mL | ORAL | Qty: 5

## 2017-03-14 MED FILL — IPRATROPIUM-ALBUTEROL 2.5 MG-0.5 MG/3 ML NEB SOLUTION: 2.5 mg-0.5 mg/3 ml | RESPIRATORY_TRACT | Qty: 3

## 2017-03-14 MED FILL — ACETYLCYSTEINE 20 % (200 MG/ML) SOLN: 200 mg/mL (20 %) | Qty: 4

## 2017-03-14 MED FILL — FUROSEMIDE 10 MG/ML IJ SOLN: 10 mg/mL | INTRAMUSCULAR | Qty: 4

## 2017-03-14 MED FILL — HEPARIN (PORCINE) 5,000 UNIT/ML IJ SOLN: 5000 unit/mL | INTRAMUSCULAR | Qty: 1

## 2017-03-14 MED FILL — ASPIRIN 81 MG TAB, DELAYED RELEASE: 81 mg | ORAL | Qty: 1

## 2017-03-14 MED FILL — LOVENOX 40 MG/0.4 ML SUBCUTANEOUS SYRINGE: 40 mg/0.4 mL | SUBCUTANEOUS | Qty: 0.4

## 2017-03-14 MED FILL — SOLU-MEDROL (PF) 125 MG/2 ML SOLUTION FOR INJECTION: 125 mg/2 mL | INTRAMUSCULAR | Qty: 2

## 2017-03-14 NOTE — Progress Notes (Signed)
Pharmacy ??? Anticoagulation Monitoring      Indication: DVT prophylaxis     Current Dose: Heparin 5,000 units subQ Q12H  Creatinine Clearance (mL/min): > 30 ml/min    Current Weight: 132 kg     Labs:  Recent Labs      03/14/17   0341  03/13/17   0344  03/11/17   2000   CREA  0.78  0.93  1.22*   HGB  11.1*  10.8*  12.1   PLT  242  247  247   INR   --    --   1.1     Wt Readings from Last 1 Encounters:   03/14/17 132.9 kg (292 lb 15.9 oz)     Ht Readings from Last 1 Encounters:   03/14/17 167.6 cm (66")       Impression/Plan:   - BMI > 40 ; CrCl > 30 ml/min  - H/H stable   - Will adjust heparin 5,000 units subQ to lovenox 40 mg subQ Q12H per protocol     Abdallah Hern Drema Halon, PharmD

## 2017-03-14 NOTE — Consults (Signed)
[facility]  CONSULTATION    Brittney Shelton, Brittney Shelton  MR#: [id number]  DOB: [birth year]  ACCOUNT #: [id number]   DATE OF SERVICE: 03/14/2017    REQUESTING PHYSICIAN:  Hospitalist service.    INDICATIONS:  Acute on chronic respiratory failure, possible COPD.    CHIEF COMPLAINT:  Shortness of breath.    HISTORY OF PRESENT ILLNESS:  The patient is a 66 year old lady who presented to the hospital on 03/12/2017 secondary to acute on chronic respiratory failure where she was on home oxygen and increased volume overload with an elevation in her proBNP of 15,698.  Echocardiogram was normal, however, was noted to have pulmonary hypertension without RV dilation, but it was noted to have some septal flattening.  She is currently on high flow at this time.  Patient is very resistant in giving medical information at this time.    PAST MEDICAL HISTORY:  Questionable COPD, but only smoked for 2 years; congestive heart failure, diabetes, hypertension, volume overload.    SOCIAL HISTORY:  Smoked from the age of [age].    FAMILY HISTORY:  COPD, but no lung cancer.    ALLERGIES:  LISINOPRIL.    HOME MEDICATIONS:  Metoprolol, Pravachol, Lasix, Norvasc, hydralazine, Benadryl, Norco, Robaxin, Isordil, clonidine, Naprosyn.    REVIEW OF SYSTEMS:  GENERAL:  No fevers or chills.  EYES:  No blurred vision.  EARS:  No tenderness or deafness noted.  NOSE:  No sinusitis, obstruction, or nosebleeds.  THROAT:  No soreness or hoarseness.  CARDIOVASCULAR:  See HPI.  PULMONARY:  See HPI.  GASTROINTESTINAL:  No nausea or vomiting.  GENITOURINARY:  No dysuria or hematuria.  ENDOCRINE:  No polyuria.  LYMPHATIC:  No enlarged or painful lymph nodes.  HEMATOLOGIC:  No easy bruising or bleeding.  PSYCHIATRIC:  Denies depression.    PHYSICAL EXAMINATION:  VITAL SIGNS:  Temperature 98.6 with a pulse of 82, respiration rate 20, blood pressure 126/70, sats 95%.  She is currently on high flow at 25 liters 95% .   GENERAL:  Well developed and well-nourished, African-American lady, morbidly obese.  HEENT:  Normocephalic, atraumatic.  NECK:  Supple, without mass, JVD, or carotid bruits.  LYMPHATIC:  No palpable supraclavicular, submandibular or cervical adenopathy  LUNGS:  Decreased breath sounds in the bases.  CARDIOVASCULAR:  Regular rhythm.  ABDOMEN:  Soft, nontender.  EXTREMITIES:  Edema.  NEUROLOGIC:  Cranial nerves II-XII grossly intact.    DIAGNOSTIC DATA:  Chest CT that was done on admission demonstrated small pulmonary nodules up to 5 mm, enlargement of the pulmonary arteries.    IMPRESSION:  1.  Pulmonary hypertension.  2.  Acute respiratory failure.  3.  Morbid obesity.  4.  Diastolic heart failure.  5.  Signs and symptoms of sleep apnea.    DISCUSSION AND PLAN:  1.  Wean oxygen if tolerated.  2.  Needs more aggressive diuresis.  3.  I think her pulmonary hypertension is probably secondary to sleep apnea and her morbid obesity and body habitus.  I doubt that she has acute primary pulmonary hypertension; however, after patient has reached euvolemia and this acute episode is resolved would require a heart catheterization.  4.  Outpatient pulmonary function testing.  5.  Needs repeat CT scan in 1 year for pulmonary nodules.  These are low risk given her lack of smoking history.      Cherly Anderson, MD       SG / LN  D: 03/14/2017 14:57     T: 03/14/2017 20:23  JOB #: [id number]

## 2017-03-14 NOTE — Progress Notes (Signed)
PCU SHIFT NURSING NOTE    Bedside and Verbal shift change report given to Pecolia Ades, RN (oncoming nurse) by Serina Cowper, RN (offgoing nurse). Report included the following information SBAR, Kardex, ED Summary, Procedure Summary, Intake/Output, MAR, Recent Results, Med Rec Status, Cardiac Rhythm NSR, Alarm Parameters  and Quality Measures.    Shift Summary:   1233--Paged Dr. Cherly Anderson for consult on patient put in by Dr. Emeline Darling for Hypoxia/Pulmonary Hypertension. Will wait on return call.       Admission Date 03/11/2017   Admission Diagnosis Acute respiratory failure with hypoxia (HCC)   Consults IP CONSULT TO HOSPITALIST  IP CONSULT TO CARDIOLOGY  IP CONSULT TO PALLIATIVE CARE - PROVIDER  IP CONSULT TO INTENSIVIST        Consults   PT   OT   Speech   Case Management       Palliative      Cardiac Monitoring Order   Yes   No     IV drips   Yes    Drip:                            Dose:  Drip:                            Dose:  Drip:                            Dose:   No     GI Prophylaxis   Yes   No         DVT Prophylaxis   SCDs:             Ted stockings:          Medication   Contraindicated   None      Activity Level Activity Level: Bed Rest     Activity Assistance: Partial (two people)   Purposeful Rounding every 1-2 hour?   Yes   Noreene Filbert Score  Total Score: 3   Bed Alarm (If score 3 or >)   Yes    Refused (See signed refusal form in chart)   Braden Score  Braden Score: 16   Braden Score (if score 14 or less)   PMT consult   Wound Care consult      Specialty bed    Nutrition consult          Needs prior to discharge:   Home O2 required:    Yes   No    If yes, how much O2 required? 4 liters baseline at home    Other:    Last Bowel Movement: Last Bowel Movement Date: 03/11/17      Influenza Vaccine Received Flu Vaccine for Current Season (usually Sept-March): No    Patient/Guardian Refused (Notify MD): Yes   Pneumonia Vaccine            Diet Active Orders   Diet    DIET CARDIAC Regular      LDAs               Peripheral IV 03/12/17 Left Antecubital (Active)   Site Assessment Clean, dry, & intact 03/14/2017  7:30 AM   Phlebitis Assessment 0 03/14/2017  7:30 AM   Infiltration Assessment 0 03/14/2017  7:30 AM   Dressing Status Clean, dry, & intact 03/14/2017  7:30 AM  Dressing Type Tape;Transparent 03/14/2017  7:30 AM   Hub Color/Line Status Pink;Capped;Flushed 03/14/2017  7:30 AM   Action Taken Open ports on tubing capped 03/14/2017  7:30 AM   Alcohol Cap Used Yes 03/14/2017  7:30 AM          External Female Catheter 03/12/17 (Active)   Site Assessment Clean, dry, & intact 03/14/2017  7:30 AM   Repositioned Yes 03/14/2017  7:30 AM   Perineal Care Yes 03/14/2017  7:30 AM   Wick Changed Yes 03/14/2017  7:30 AM   Suction Canister/Tubing Changed Yes 03/14/2017  7:30 AM   Urine Output (mL) 350 ml 03/14/2017  7:30 AM                Urinary Catheter      Intake & Output   Date 03/13/17 0700 - 03/14/17 0659 03/14/17 0700 - 03/15/17 0659   Shift 0700-1859 1900-0659 24 Hour Total 0700-1859 1900-0659 24 Hour Total   I  N  T  A  K  E   Shift Total  (mL/kg)         O  U  T  P  U  T   Urine  (mL/kg/hr) 1700  (1.1) 1400  (0.9) 3100  (1) 350  350      Urine Occurrence(s) 1 x  1 x         Urine Output (mL) (External Female Catheter 03/12/17) 1700 1400 3100 350  350    Shift Total  (mL/kg) 1700  (12.9) 1400  (10.5) 3100  (23.3) 350  (2.6)  350  (2.6)   NET -1700 -1400 -3100 -350  -350   Weight (kg) 132.1 132.9 132.9 132.9 132.9 132.9         Readmission Risk Assessment Tool Score High Risk            33       Total Score        3 Has Seen PCP in Last 6 Months (Yes=3, No=0)    4 IP Visits Last 12 Months (1-3=4, 4=9, >4=11)    9 Pt. Coverage (Medicare=5 , Medicaid, or Self-Pay=4)    17 Charlson Comorbidity Score (Age + Comorbid Conditions)        Criteria that do not apply:    Married. Living with Significant Other. Assisted Living. LTAC. SNF. or   Rehab     Patient Length of Stay (>5 days = 3)       Expected Length of Stay 4d 2h   Actual Length of Stay 2

## 2017-03-14 NOTE — Progress Notes (Signed)
PCU SHIFT NURSING NOTE    Bedside shift change report given to Lequita Halt (Cabin crew) by Chrissie Noa (offgoing nurse). Report included the following information SBAR, Kardex, Intake/Output, MAR, Recent Results and Cardiac Rhythm NSR.      1930: Patient sitting up in recliner watching television. Urine on bed pads. Hygiene care given x2 and assisted back to bed.     Admission Date 03/11/2017   Admission Diagnosis Acute respiratory failure with hypoxia (HCC)   Consults IP CONSULT TO HOSPITALIST  IP CONSULT TO CARDIOLOGY  IP CONSULT TO PALLIATIVE CARE - PROVIDER  IP CONSULT TO INTENSIVIST        Consults   PT   OT   Speech   Case Management       Palliative      Cardiac Monitoring Order   Yes   No     IV drips   Yes    Drip:                            Dose:  Drip:                            Dose:  Drip:                            Dose:   No     GI Prophylaxis   Yes   No         DVT Prophylaxis   SCDs:             Ted stockings:          Medication   Contraindicated   None      Activity Level Activity Level: Up with Assistance     Activity Assistance: Partial (two people)   Purposeful Rounding every 1-2 hour?   Yes   Noreene Filbert Score  Total Score: 3   Bed Alarm (If score 3 or >)   Yes    Refused (See signed refusal form in chart)   Braden Score  Braden Score: 16   Braden Score (if score 14 or less)   PMT consult   Wound Care consult      Specialty bed    Nutrition consult          Needs prior to discharge:   Home O2 required:    Yes   No    If yes, how much O2 required?    Other:    Last Bowel Movement: Last Bowel Movement Date: 03/11/17      Influenza Vaccine Received Flu Vaccine for Current Season (usually Sept-March): No    Patient/Guardian Refused (Notify MD): Yes   Pneumonia Vaccine           Diet Active Orders   Diet    DIET CARDIAC Regular      LDAs               Peripheral IV 03/12/17 Left Antecubital (Active)   Site Assessment Clean, dry, & intact 03/14/2017  7:28 PM    Phlebitis Assessment 0 03/14/2017  7:28 PM   Infiltration Assessment 0 03/14/2017  7:28 PM   Dressing Status Clean, dry, & intact 03/14/2017  7:28 PM   Dressing Type Tape;Transparent 03/14/2017  7:28 PM   Hub Color/Line Status Pink 03/14/2017  7:28 PM   Action Taken Open ports on tubing capped 03/14/2017  6:00 PM   Alcohol Cap Used Yes 03/14/2017  6:00 PM          External Female Catheter 03/12/17 (Active)   Site Assessment Clean, dry, & intact 03/14/2017  7:28 PM   Repositioned Yes 03/14/2017  7:28 PM   Perineal Care Yes 03/14/2017  7:28 PM   Wick Changed Yes 03/14/2017  7:28 PM   Suction Canister/Tubing Changed Yes 03/14/2017  7:28 PM   Urine Output (mL) 100 ml 03/14/2017  7:28 PM                Urinary Catheter      Intake & Output   Date 03/13/17 1900 - 03/14/17 0659 03/14/17 0700 - 03/15/17 0659   Shift 1900-0659 24 Hour Total 0700-1859 1900-0659 24 Hour Total   I  N  T  A  K  E   Shift Total  (mL/kg)        O  U  T  P  U  T   Urine  (mL/kg/hr) 1400 3100 2150  (1.3) 100 2250      Urine Occurrence(s)  1 x  1 x 1 x      Urine Output (mL) (External Female Catheter 03/12/17) 1400 3100 2150 100 2250    Shift Total  (mL/kg) 1400  (10.5) 3100  (23.3) 2150  (16.2) 100  (0.8) 2250  (16.9)   NET -1400 -3100 -2150 -100 -2250   Weight (kg) 132.9 132.9 132.9 132.9 132.9         Readmission Risk Assessment Tool Score High Risk            33       Total Score        3 Has Seen PCP in Last 6 Months (Yes=3, No=0)    4 IP Visits Last 12 Months (1-3=4, 4=9, >4=11)    9 Pt. Coverage (Medicare=5 , Medicaid, or Self-Pay=4)    17 Charlson Comorbidity Score (Age + Comorbid Conditions)        Criteria that do not apply:    Married. Living with Significant Other. Assisted Living. LTAC. SNF. or   Rehab    Patient Length of Stay (>5 days = 3)       Expected Length of Stay 4d 2h   Actual Length of Stay 2

## 2017-03-14 NOTE — Progress Notes (Signed)
03/14/2017 7:06 PM    Admit Date: 03/11/2017    Admit Diagnosis:   Acute respiratory failure with hypoxia (HCC)    Subjective:     Brittney Shelton says SOB is better.    Current Facility-Administered Medications   Medication Dose Route Frequency   ??? [START ON 03/15/2017] methylPREDNISolone (PF) (SOLU-MEDROL) injection 60 mg  60 mg IntraVENous Q6H   ??? [START ON 03/15/2017] furosemide (LASIX) tablet 40 mg  40 mg Oral BID   ??? albuterol-ipratropium (DUO-NEB) 2.5 MG-0.5 MG/3 ML  3 mL Nebulization Q6H RT   ??? acetylcysteine (MUCOMYST) 200 mg/mL (20 %) solution 600 mg  600 mg Oral BID   ??? enoxaparin (LOVENOX) injection 40 mg  40 mg SubCUTAneous Q12H   ??? acetaminophen (TYLENOL) tablet 650 mg  650 mg Oral Q6H PRN   ??? HYDROcodone-acetaminophen (NORCO) 5-325 mg per tablet 1 Tab  1 Tab Oral Q6H PRN   ??? metoprolol succinate (TOPROL-XL) XL tablet 50 mg  50 mg Oral DAILY   ??? pravastatin (PRAVACHOL) tablet 20 mg  20 mg Oral QHS   ??? aspirin delayed-release tablet 81 mg  81 mg Oral DAILY   ??? methocarbamol (ROBAXIN) tablet 500 mg  500 mg Oral QID   ??? umeclidinium-vilanterol (ANORO ELLIPTA) 62.5 mcg- 25 mcg/inhalation  1 Puff Inhalation DAILY   ??? amLODIPine (NORVASC) tablet 5 mg  5 mg Oral DAILY         Objective:      Physical Exam: ??  Visit Vitals   ??? BP 138/68 (BP 1 Location: Right arm, BP Patient Position: At rest)   ??? Pulse 84   ??? Temp 98.4 ??F (36.9 ??C)   ??? Resp 20   ??? Ht  (1.676 m)   ??? Wt 292 lb 15.9 oz (132.9 kg)   ??? SpO2 95%   ??? BMI 47.29 kg/m2     Gen:  NAD  Mental Status - Alert. General Appearance - Not in acute distress.   Chest and Lung Exam   Inspection: Accessory muscles - No use of accessory muscles in breathing.   Auscultation:   Breath sounds: - Normal.   Cardiovascular   Inspection: Jugular vein - Bilateral - Inspection Normal.   Palpation/Percussion:   Apical Impulse: - Normal.   Auscultation: Rhythm - Regular. Heart Sounds - S1 WNL and S2 WNL. No S3 or S4.    Murmurs & Other Heart Sounds: Auscultation of the heart reveals - No Murmurs.   Peripheral Vascular   Upper Extremity: Inspection - Bilateral - No Cyanotic nailbeds or Digital clubbing.   Lower Extremity:   Palpation: Edema - Bilateral - No edema.  Abdomen:?? ??Soft, non-tender, bowel sounds are active.  Neuro: A&O times 3, CN and motor grossly WNL    Data Review:   Recent Labs      03/14/17   0341  03/13/17   0344  03/11/17   2000   WBC  5.5  5.7  9.7   HGB  11.1*  10.8*  12.1   HCT  36.0  36.2  38.4   PLT  242  247  247     Recent Labs      03/14/17   0341  03/13/17   0344  03/11/17   2000   NA  140  141  140   K  3.8  3.7  4.0   CL  100  102  101   CO2  36*  34*  33*  GLU  97  94  108*   BUN  22*  26*  30*   CREA  0.78  0.93  1.22*   CA  7.9*  7.9*  8.4*   MG  1.8   --   2.0   PHOS  4.1   --    --    ALB   --   2.7*  3.2*   TBILI   --   0.8  1.0   SGOT   --   9*  15   ALT   --   8*  8*   INR   --    --   1.1       Recent Labs      03/11/17   2000   TROIQ  <0.05   CPK  129   CKMB  <1.0         Intake/Output Summary (Last 24 hours) at 03/14/17 1909  Last data filed at 03/14/17 1800   Gross per 24 hour   Intake                0 ml   Output             3550 ml   Net            -3550 ml        Telemetry: normal sinus rhythm    Assessment:     Active Problems:    Hypercholesterolemia (09/29/2014)      COPD (chronic obstructive pulmonary disease) (HCC) (07/26/2015)      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 CTA neg for PE.  Troponin neg.  proBNP [id number].  Patient has felt at her baseline.  COPD history.    ?? ECHO shows pulmonary hypertension, but no other new concerns  ?? Does not appear heart failure related- no fluid on CTA, patient asymptomatic, weight only slightly up from last office weight in the Spring.    ?? 3.5 L negative- going back to dome lasix dose in AM  ????  ??   HTN:  BP stable  ?? Multiple home meds still on hold  ?? Continue lower dose of amlodipine  ?? Slightly increased the dose of toprol    Cardiac status stable.  Continue treatment of suspected COPD.  Dr. Orvil Feil will see again Monday if no acute cardiac cocerns over the weekend.

## 2017-03-14 NOTE — Progress Notes (Signed)
Problem: Pressure Injury - Risk of  Goal: *Prevention of pressure injury  Document Braden Scale and appropriate interventions in the flowsheet.   Outcome: Progressing Towards Goal  Pressure Injury Interventions:  Sensory Interventions: Assess changes in LOC, Assess need for specialty bed, Chair cushion, Discuss PT/OT consult with provider, Keep linens dry and wrinkle-free, Minimize linen layers, Pad between skin to skin, Turn and reposition approx. every two hours (pillows and wedges if needed), Use 30-degree side-lying position, Pressure redistribution bed/mattress (bed type), Monitor skin under medical devices, Maintain/enhance activity level, Float heels, Check visual cues for pain, Avoid rigorous massage over bony prominences    Moisture Interventions: Absorbent underpads, Apply protective barrier, creams and emollients, Assess need for specialty bed, Maintain skin hydration (lotion/cream), Offer toileting Q_hr, Moisture barrier, Limit adult briefs, Minimize layers, Internal/External urinary devices, Check for incontinence Q2 hours and as needed    Activity Interventions: Assess need for specialty bed, Chair cushion, Increase time out of bed, PT/OT evaluation, Pressure redistribution bed/mattress(bed type)    Mobility Interventions: Assess need for specialty bed, Chair cushion, HOB 30 degrees or less, PT/OT evaluation, Turn and reposition approx. every two hours(pillow and wedges), Pressure redistribution bed/mattress (bed type), Float heels    Nutrition Interventions: Document food/fluid/supplement intake, Discuss nutritional consult with provider, Offer support with meals,snacks and hydration    Friction and Shear Interventions: Apply protective barrier, creams and emollients, Feet elevated on foot rest, HOB 30 degrees or less, Lift team/patient mobility team, Sit at 90-degree angle, Transferring/repositioning devices, Transfer aides:transfer board/Hoyer  lift/ceiling lift, Minimize layers, Lift sheet, Foam dressings/transparent film/skin sealants               Problem: Falls - Risk of  Goal: *Absence of Falls  Document Schmid Fall Risk and appropriate interventions in the flowsheet.   Outcome: Progressing Towards Goal  Fall Risk Interventions:  Mobility Interventions: Assess mobility with egress test, Bed/chair exit alarm, Patient to call before getting OOB, PT Consult for assist device competence, Utilize walker, cane, or other assistive device, Utilize gait belt for transfers/ambulation, Strengthening exercises (ROM-active/passive), PT Consult for mobility concerns, OT consult for ADLs, Communicate number of staff needed for ambulation/transfer         Medication Interventions: Bed/chair exit alarm, Patient to call before getting OOB, Utilize gait belt for transfers/ambulation, Teach patient to arise slowly, Evaluate medications/consider consulting pharmacy    Elimination Interventions: Bed/chair exit alarm, Call light in reach, Elevated toilet seat, Toileting schedule/hourly rounds, Toilet paper/wipes in reach, Patient to call for help with toileting needs

## 2017-03-14 NOTE — Progress Notes (Addendum)
Hospitalist Progress Note  NAME: Brittney Shelton   DOB:  1950-11-17   MRN:  [id number]       Assessment / Plan:  Acute on chronic hypoxic respiratory failure  Acute exacerbation of CHF/pulm HTN vs COPD exacerbation  -Repeat Echo 10/11, EF 60 % to 65 %. There were no regional wall motion abnormalities, was moderate pulmonary hypertension.  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  DM2  -last A1C on record 11/2016 is 5.8. No meds listed  -SSI   ??  HTN  Cont toprol and amlodipine per home regimen  ??  HLD  -cont home pravastatin  ??  Code Status: Full  Surrogate Decision Maker: Justine Null [phone]  ??  DVT Prophylaxis: sq heparin  GI Prophylaxis: not indicated  ??  Baseline: lives at home with family     Subjective:     Chief Complaint / Reason for Physician Visit  "dyspnea is better".  Discussed with RN events overnight.     Review of Systems:  Symptom Y/N Comments  Symptom Y/N Comments   Fever/Chills n   Chest Pain n    Poor Appetite    Edema     Cough nj   Abdominal Pain n    Sputum    Joint Pain y Knee and back pain   SOB/DOE y   Pruritis/Rash     Nausea/vomit    Tolerating PT/OT     Diarrhea    Tolerating Diet     Constipation    Other       Could NOT obtain due to:      Objective:     VITALS:   Last 24hrs VS reviewed since prior progress note. Most recent are:  Patient Vitals for the past 24 hrs:   Temp Pulse Resp BP SpO2   03/14/17 1108 98.8 ??F (37.1 ??C) 78 20 127/72 93 %    03/14/17 0814 - 79 - - 96 %   03/14/17 0753 - - - - 97 %   03/14/17 0730 98.6 ??F (37 ??C) 85 20 159/68 98 %   03/14/17 0351 - - - - 95 %   03/14/17 0340 98.5 ??F (36.9 ??C) 86 22 164/72 98 %   03/14/17 0136 - - - - 95 %   03/13/17 2320 98.7 ??F (37.1 ??C) 82 20 149/74 99 %   03/13/17 2124 - - - - 95 %   03/13/17 1929 99.1 ??F (37.3 ??C) 82 20 142/64 98 %   03/13/17 1748 98.2 ??F (36.8 ??C) 82 20 162/71 97 %   03/13/17 1432 98.2 ??F (36.8 ??C) 80 20 146/74 96 %       Intake/Output Summary (Last 24 hours) at 03/14/17 1209  Last data filed at 03/14/17 0730   Gross per 24 hour   Intake                0 ml   Output  2650 ml   Net            -2650 ml        PHYSICAL EXAM:  General: WD, WN. Alert, cooperative, no acute distress????  EENT:  EOMI. Anicteric sclerae. MMM  Resp:  BL basilar crackle, no wheezing or rales.  No accessory muscle use  CV:  Regular  rhythm,?? No edema  GI:  Soft, Non distended, Non tender. ??+Bowel sounds  Neurologic:?? Alert and oriented X 3, normal speech, at baseline  Psych:???? Good insight.??Not anxious nor agitated  Skin:  No rashes.  No jaundice    Reviewed most current lab test results and cultures  YES  Reviewed most current radiology test results   YES  Review and summation of old records today    NO  Reviewed patient's current orders and MAR    YES  PMH/SH reviewed - no change compared to H&P  ________________________________________________________________________  Care Plan discussed with:    Comments   Patient x    Family      RN x    Care Manager     Consultant                        Multidiciplinary team rounds were held today with case manager, nursing, pharmacist and Higher education careers adviser.  Patient's plan of care was discussed; medications were reviewed and discharge planning was addressed.     ________________________________________________________________________  Total NON critical care TIME:  25   Minutes    Total CRITICAL CARE TIME Spent:   Minutes non procedure based      Comments    >50% of visit spent in counseling and coordination of care     ________________________________________________________________________  Rosalio Macadamia, MD     Procedures: see electronic medical records for all procedures/Xrays and details which were not copied into this note but were reviewed prior to creation of Plan.      LABS:  I reviewed today's most current labs and imaging studies.  Pertinent labs include:  Recent Labs      03/14/17   0341  03/13/17   0344  03/11/17   2000   WBC  5.5  5.7  9.7   HGB  11.1*  10.8*  12.1   HCT  36.0  36.2  38.4   PLT  242  247  247     Recent Labs      03/14/17   0341  03/13/17   0344  03/11/17   2000   NA  140  141  140   K  3.8  3.7  4.0   CL  100  102  101   CO2  36*  34*  33*   GLU  97  94  108*   BUN  22*  26*  30*   CREA  0.78  0.93  1.22*   CA  7.9*  7.9*  8.4*   MG  1.8   --   2.0   PHOS  4.1   --    --    ALB   --   2.7*  3.2*   TBILI   --   0.8  1.0   SGOT   --   9*  15   ALT   --   8*  8*   INR   --    --   1.1       Signed: Rosalio Macadamia, MD

## 2017-03-14 NOTE — Progress Notes (Signed)
ADULT PROTOCOL: JET AEROSOL ASSESSMENT    Patient  Brittney Shelton     66 y.o.   female     03/15/2017  1:42 AM    Breath Sounds Pre Procedure: Right Breath Sounds: Clear, Diminished                               Left Breath Sounds: Clear, Diminished    Breath Sounds Post Procedure: Right Breath Sounds: Diminished                                 Left Breath Sounds: Diminished    Breathing pattern: Pre procedure Breathing Pattern: Regular          Post procedure Breathing Pattern: Regular    Heart Rate: Pre procedure Pulse: 83           Post procedure Pulse: 83    Resp Rate: Pre procedure Respirations: 20           Post procedure Respirations: 22    Peak Flow: Pre bronchodilator             Post bronchodilator       FVC/FEV1:  N/A    Incentive Spirometry:  Actual Volume (ml): 500 ml          Cough: Pre procedure Cough: Non-productive               Post procedure Cough: Non-productive    Suctioned: NO    Sputum: Pre procedure                   Post procedure      Oxygen: O2 Device: Hi flow nasal cannula   25L @ 95% Hi Flow nasal cannula     Changed: NO    SpO2: Pre procedure SpO2: 95 %   with oxygen              Post procedure SpO2: 97 %  with oxygen    Nebulizer Therapy: Current medications Aerosolized Medications: DuoNeb      Changed: NO    Smoking History:    Smoking status: Never Smoker   ??? Smokeless tobacco: Never Used         Problem List:   Patient Active Problem List   Diagnosis Code   ??? Well controlled type 2 diabetes mellitus (HCC) E11.9   ??? Hypercholesterolemia E78.00   ??? 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 COPD (chronic obstructive pulmonary disease) (HCC) J44.9   ??? Obesity E66.9   ??? Requires continuous at home supplemental oxygen Z99.81   ??? Dependence on supplemental oxygen Z99.81   ??? ACP (advance care planning) Z71.89   ??? Counseling regarding advanced care planning and goals of care Z71.89   ??? Dyspnea R06.00    ??? Acute on chronic diastolic (congestive) heart failure (HCC) I50.33   ??? Obesity, morbid (HCC) E66.01   ??? 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 Intractable back pain M54.9   ??? Ambulatory dysfunction R26.2   ??? 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 Angioedema T78.3XXA   ??? Acute respiratory failure with hypoxia (HCC) J96.01       Respiratory Therapist: Anselmo Rod, RT

## 2017-03-14 NOTE — Progress Notes (Signed)
Pharmacy Automatic Renal Dosing Protocol - Antimicrobials    Indication for Antimicrobials: URI    Current Regimen of Each Antimicrobial:  Levofloxacin 500 mg Q24H (Start Date 10/13; Day # 1)    Previous Antimicrobial Therapy:    Vancomycin Goal Level:     Vancomycin Levels  Date Dose & Interval Measured (mcg/mL) Steady State (mcg/mL)                           Significant Cultures:     Radiology / Imaging results: (X-ray, CT scan or MRI):       Labs:  Recent Labs      03/14/17   0341  03/13/17   0344  03/11/17   2000   CREA  0.78  0.93  1.22*   BUN  22*  26*  30*   WBC  5.5  5.7  9.7     Temp (24hrs), Avg:98.6 ??F (37 ??C), Min:98.4 ??F (36.9 ??C), Max:98.8 ??F (37.1 ??C)        Paralysis, amputations, malnutrition: No  Creatinine Clearance (mL/min) or Dialysis: 65    Impression/Plan:   Levofloxacin 500 mg Q24H for 5 days.  BMP QOD     Pharmacy will follow daily and adjust medications as appropriate for renal function and/or serum levels.    Thank you,  Delma Officer, [facility]      Recommended duration of therapy  [email or web address]    Renal Dosing  [email or web address]

## 2017-03-15 ENCOUNTER — Inpatient Hospital Stay: Payer: MEDICARE | Primary: Internal Medicine

## 2017-03-15 LAB — GLUCOSE, POC
Glucose (POC): 235 mg/dL — ABNORMAL HIGH (ref 65–100)
Glucose (POC): 155 mg/dL — ABNORMAL HIGH (ref 65–100)
Glucose (POC): 274 mg/dL — ABNORMAL HIGH (ref 65–100)
Glucose (POC): 205 mg/dL — ABNORMAL HIGH (ref 65–100)

## 2017-03-15 LAB — CBC W/O DIFF
ABSOLUTE NRBC: 0 10*3/uL (ref 0.00–0.01)
HCT: 40.1 % (ref 35.0–47.0)
HGB: 12.5 g/dL (ref 11.5–16.0)
MCH: 27.8 PG (ref 26.0–34.0)
MCHC: 31.2 g/dL (ref 30.0–36.5)
MCV: 89.1 FL (ref 80.0–99.0)
MPV: 11 FL (ref 8.9–12.9)
NRBC: 0 PER 100 WBC
PLATELET: 254 10*3/uL (ref 150–400)
RBC: 4.5 M/uL (ref 3.80–5.20)
RDW: 19.9 % — ABNORMAL HIGH (ref 11.5–14.5)
WBC: 5.6 10*3/uL (ref 3.6–11.0)

## 2017-03-15 LAB — METABOLIC PANEL, BASIC
Anion gap: 5 mmol/L (ref 5–15)
BUN/Creatinine ratio: 26 — ABNORMAL HIGH (ref 12–20)
BUN: 22 MG/DL — ABNORMAL HIGH (ref 6–20)
CO2: 36 mmol/L — ABNORMAL HIGH (ref 21–32)
Calcium: 8.1 MG/DL — ABNORMAL LOW (ref 8.5–10.1)
Chloride: 97 mmol/L (ref 97–108)
Creatinine: 0.86 MG/DL (ref 0.55–1.02)
GFR est AA: >60 mL/min/{1.73_m2} (ref >60)
GFR est non-AA: >60 mL/min/{1.73_m2} (ref >60)
Glucose: 179 mg/dL — ABNORMAL HIGH (ref 65–100)
Potassium: 4.5 mmol/L (ref 3.5–5.1)
Sodium: 138 mmol/L (ref 136–145)

## 2017-03-15 LAB — PHOSPHORUS: Phosphorus: 3.3 MG/DL (ref 2.6–4.7)

## 2017-03-15 LAB — MAGNESIUM: Magnesium: 1.9 mg/dL (ref 1.6–2.4)

## 2017-03-15 MED FILL — IPRATROPIUM-ALBUTEROL 2.5 MG-0.5 MG/3 ML NEB SOLUTION: 2.5 mg-0.5 mg/3 ml | RESPIRATORY_TRACT | Qty: 3

## 2017-03-15 MED FILL — TOPROL XL 50 MG TABLET,EXTENDED RELEASE: 50 mg | ORAL | Qty: 1

## 2017-03-15 MED FILL — AMLODIPINE 5 MG TAB: 5 mg | ORAL | Qty: 1

## 2017-03-15 MED FILL — HYDROCODONE-ACETAMINOPHEN 5 MG-325 MG TAB: 5-325 mg | ORAL | Qty: 1

## 2017-03-15 MED FILL — SOLU-MEDROL (PF) 125 MG/2 ML SOLUTION FOR INJECTION: 125 mg/2 mL | INTRAMUSCULAR | Qty: 2

## 2017-03-15 MED FILL — PRAVASTATIN 10 MG TAB: 10 mg | ORAL | Qty: 2

## 2017-03-15 MED FILL — LOVENOX 40 MG/0.4 ML SUBCUTANEOUS SYRINGE: 40 mg/0.4 mL | SUBCUTANEOUS | Qty: 0.4

## 2017-03-15 MED FILL — METHOCARBAMOL 500 MG TAB: 500 mg | ORAL | Qty: 1

## 2017-03-15 MED FILL — LEVOFLOXACIN 500 MG TAB: 500 mg | ORAL | Qty: 1

## 2017-03-15 MED FILL — ACETYLCYSTEINE 20 % (200 MG/ML) SOLN: 200 mg/mL (20 %) | Qty: 4

## 2017-03-15 MED FILL — FUROSEMIDE 40 MG TAB: 40 mg | ORAL | Qty: 1

## 2017-03-15 MED FILL — ASPIRIN 81 MG TAB, DELAYED RELEASE: 81 mg | ORAL | Qty: 1

## 2017-03-15 MED FILL — ENOXAPARIN 40 MG/0.4 ML SUB-Q SYRINGE: 40 mg/0.4 mL | SUBCUTANEOUS | Qty: 0.4

## 2017-03-15 NOTE — Progress Notes (Signed)
Problem: Pressure Injury - Risk of  Goal: *Prevention of pressure injury  Document Braden Scale and appropriate interventions in the flowsheet.   Outcome: Progressing Towards Goal  Pressure Injury Interventions:  Sensory Interventions: Assess changes in LOC, Assess need for specialty bed, Chair cushion, Discuss PT/OT consult with provider, Keep linens dry and wrinkle-free, Minimize linen layers, Pad between skin to skin, Sit a 90-degree angle/use footstool if needed, Turn and reposition approx. every two hours (pillows and wedges if needed), Use 30-degree side-lying position, Pressure redistribution bed/mattress (bed type), Monitor skin under medical devices, Maintain/enhance activity level, Float heels, Check visual cues for pain, Avoid rigorous massage over bony prominences    Moisture Interventions: Internal/External urinary devices, Absorbent underpads    Activity Interventions: Increase time out of bed, PT/OT evaluation    Mobility Interventions: HOB 30 degrees or less    Nutrition Interventions: Offer support with meals,snacks and hydration    Friction and Shear Interventions: Lift team/patient mobility team               Problem: Falls - Risk of  Goal: *Absence of Falls  Document Schmid Fall Risk and appropriate interventions in the flowsheet.   Outcome: Progressing Towards Goal  Fall Risk Interventions:  Mobility Interventions: Bed/chair exit alarm, Communicate number of staff needed for ambulation/transfer, Patient to call before getting OOB         Medication Interventions: Bed/chair exit alarm    Elimination Interventions: Bed/chair exit alarm, Call light in reach

## 2017-03-15 NOTE — Progress Notes (Signed)
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 There were no regional wall motion abnormalities, was moderate pulmonary hypertension.  -echo 01/2016  EF 70%, no wall motion abnormalities  -stop lasix 40 mg IV bid 10/13, start home dose lasix 10/14 pt with contraction alkalosis   -monitor I/Os, daily weights  -cardiology consult is appreciated  -daily weights, I/Os  -cont solumedrol 60 mg iv q 6, switch to po in am and start taper, cont  duonebs, mucinex  - Remains on high flow oxygen but now able to start to wean.  --cont home anoro ellipta or formulary equiv    Lumbar degenerative disc disese/back pain  -XR spine, degenerative changes L3-S1, marked disc space narrowing at L4-L5, facet arthropathy L3-S1  -PT/OT  -pt reports pain, started on po noroc, will request ortho consult  ??  DM2  -last A1C on record 11/2016 is 5.8. No meds listed  -SSI   ??  HTN  Cont toprol and amlodipine per home regimen  ??  HLD  -cont home pravastatin  ??  Code Status: Full  Surrogate Decision Maker: Justine Null [phone]  ??  DVT Prophylaxis: sq heparin  GI Prophylaxis: not indicated  ??  Baseline: lives at home with family     Subjective:     Chief Complaint / Reason for Physician Visit  "dyspnea is better, LBP and knee pain".  Discussed with RN events overnight.     Review of Systems:  Symptom Y/N Comments  Symptom Y/N Comments   Fever/Chills n   Chest Pain n    Poor Appetite    Edema     Cough nj   Abdominal Pain n    Sputum    Joint Pain y Knee and back pain   SOB/DOE y   Pruritis/Rash     Nausea/vomit    Tolerating PT/OT     Diarrhea    Tolerating Diet     Constipation    Other       Could NOT obtain due to:      Objective:     VITALS:   Last 24hrs VS reviewed since prior progress note. Most recent are:   Patient Vitals for the past 24 hrs:   Temp Pulse Resp BP SpO2   03/15/17 1021 98.2 ??F (36.8 ??C) 84 18 132/78 95 %   03/15/17 0751 - - - - 94 %   03/15/17 0746 98.2 ??F (36.8 ??C) 83 18 154/77 93 %   03/15/17 0414 98.5 ??F (36.9 ??C) 80 16 152/69 98 %   03/15/17 0305 - - - - 96 %   03/14/17 2317 98.7 ??F (37.1 ??C) 85 18 148/72 95 %   03/14/17 1959 - - - - 95 %   03/14/17 1928 98.4 ??F (36.9 ??C) 84 18 150/77 99 %   03/14/17 1800 98.4 ??F (36.9 ??C) 84 20 138/68 95 %   03/14/17 1539 98.5 ??F (36.9 ??C) 82 20 134/66 95 %   03/14/17 1415 98.6 ??F (37 ??C) 82 20 126/70 95 %       Intake/Output Summary (Last 24 hours) at 03/15/17 1153  Last data filed at 03/15/17 1017   Gross per 24 hour   Intake  530 ml   Output             1550 ml   Net            -1020 ml        PHYSICAL EXAM:  General: WD, WN. Alert, cooperative, no acute distress????  EENT:  EOMI. Anicteric sclerae. MMM  Resp:  BL basilar crackle, no wheezing or rales.  No accessory muscle use  CV:  Regular  rhythm,?? No edema  GI:  Soft, Non distended, Non tender. ??+Bowel sounds  Neurologic:?? Alert and oriented X 3, normal speech, at baseline  Psych:???? Good insight.??Not anxious nor agitated  Skin:  No rashes.  No jaundice    Reviewed most current lab test results and cultures  YES  Reviewed most current radiology test results   YES  Review and summation of old records today    NO  Reviewed patient's current orders and MAR    YES  PMH/SH reviewed - no change compared to H&P  ________________________________________________________________________  Care Plan discussed with:    Comments   Patient x    Family      RN x    Care Manager     Consultant                        Multidiciplinary team rounds were held today with case manager, nursing, pharmacist and Higher education careers adviser.  Patient's plan of care was discussed; medications were reviewed and discharge planning was addressed.     ________________________________________________________________________   Total NON critical care TIME:  25   Minutes    Total CRITICAL CARE TIME Spent:   Minutes non procedure based      Comments   >50% of visit spent in counseling and coordination of care     ________________________________________________________________________  Rosalio Macadamia, MD     Procedures: see electronic medical records for all procedures/Xrays and details which were not copied into this note but were reviewed prior to creation of Plan.      LABS:  I reviewed today's most current labs and imaging studies.  Pertinent labs include:  Recent Labs      03/15/17   0423  03/14/17   0341  03/13/17   0344   WBC  5.6  5.5  5.7   HGB  12.5  11.1*  10.8*   HCT  40.1  36.0  36.2   PLT  254  242  247     Recent Labs      03/15/17   0423  03/14/17   0341  03/13/17   0344   NA  138  140  141   K  4.5  3.8  3.7   CL  97  100  102   CO2  36*  36*  34*   GLU  179*  97  94   BUN  22*  22*  26*   CREA  0.86  0.78  0.93   CA  8.1*  7.9*  7.9*   MG  1.9  1.8   --    PHOS  3.3  4.1   --    ALB   --    --   2.7*   TBILI   --    --   0.8   SGOT   --    --   9*   ALT   --    --   8*  Signed: Melissa Noon, MD

## 2017-03-15 NOTE — Progress Notes (Signed)
PCU SHIFT NURSING NOTE    Bedside shift change report given to Lequita Halt (Cabin crew) by Chrissie Noa (offgoing nurse). Report included the following information SBAR, Kardex, Intake/Output, MAR, Recent Results and Cardiac Rhythm NSR.      Admission Date 03/11/2017   Admission Diagnosis Acute respiratory failure with hypoxia (HCC)   Consults IP CONSULT TO HOSPITALIST  IP CONSULT TO CARDIOLOGY  IP CONSULT TO PALLIATIVE CARE - PROVIDER  IP CONSULT TO INTENSIVIST  IP CONSULT TO ORTHOPEDIC SURGERY        Consults   PT   OT   Speech   Case Management       Palliative      Cardiac Monitoring Order   Yes   No     IV drips   Yes    Drip:                            Dose:  Drip:                            Dose:  Drip:                            Dose:   No     GI Prophylaxis   Yes   No         DVT Prophylaxis   SCDs:             Ted stockings:          Medication   Contraindicated   None      Activity Level Activity Level: Up with Assistance     Activity Assistance: Partial (two people)   Purposeful Rounding every 1-2 hour?   Yes   Noreene Filbert Score  Total Score: 3   Bed Alarm (If score 3 or >)   Yes    Refused (See signed refusal form in chart)   Braden Score  Braden Score: 16   Braden Score (if score 14 or less)   PMT consult   Wound Care consult      Specialty bed    Nutrition consult          Needs prior to discharge:   Home O2 required:    Yes   No    If yes, how much O2 required?    Other:    Last Bowel Movement: Last Bowel Movement Date: 03/15/17      Influenza Vaccine Received Flu Vaccine for Current Season (usually Sept-March): No    Patient/Guardian Refused (Notify MD): Yes   Pneumonia Vaccine           Diet Active Orders   Diet    DIET CARDIAC Regular      LDAs               Peripheral IV 03/12/17 Left Antecubital (Active)   Site Assessment Clean, dry, & intact 03/15/2017  7:55 PM   Phlebitis Assessment 0 03/15/2017  7:55 PM   Infiltration Assessment 0 03/15/2017  7:55 PM    Dressing Status Clean, dry, & intact 03/15/2017  7:55 PM   Dressing Type Tape;Transparent 03/15/2017  7:55 PM   Hub Color/Line Status Pink;Capped 03/15/2017  7:55 PM   Action Taken Open ports on tubing capped 03/15/2017  6:00 PM   Alcohol Cap Used Yes 03/15/2017  6:00 PM  External Female Catheter 03/12/17 (Active)   Site Assessment Clean, dry, & intact 03/15/2017  7:55 PM   Repositioned No 03/15/2017  7:55 PM   Perineal Care No 03/15/2017  7:55 PM   Wick Changed No 03/15/2017  7:55 PM   Suction Canister/Tubing Changed No 03/15/2017  7:55 PM   Urine Output (mL) 550 ml 03/15/2017  5:46 PM                Urinary Catheter      Intake & Output   Date 03/14/17 1900 - 03/15/17 0659 03/15/17 0700 - 03/16/17 0659   Shift 1900-0659 24 Hour Total 0700-1859 1900-0659 24 Hour Total   I  N  T  A  K  E   P.O.   1160  1160      P.O.   1160  1160    Shift Total  (mL/kg)   1160  (8.7)  1160  (8.7)   O  U  T  P  U  T   Urine  (mL/kg/hr) 300 2450 1325  (0.8)  1325      Urine Occurrence(s) 1 x 1 x         Urine Output (mL) (External Female Catheter 03/12/17) 300 2450 1325  1325    Stool           Stool Occurrence(s)   1 x  1 x    Shift Total  (mL/kg) 300  (2.3) 2450  (18.4) 1325  (10)  1325  (10)   NET -300 -2450 -165  -165   Weight (kg) 132.9 132.9 132.9 132.9 132.9         Readmission Risk Assessment Tool Score High Risk            33       Total Score        3 Has Seen PCP in Last 6 Months (Yes=3, No=0)    4 IP Visits Last 12 Months (1-3=4, 4=9, >4=11)    9 Pt. Coverage (Medicare=5 , Medicaid, or Self-Pay=4)    17 Charlson Comorbidity Score (Age + Comorbid Conditions)        Criteria that do not apply:    Married. Living with Significant Other. Assisted Living. LTAC. SNF. or   Rehab    Patient Length of Stay (>5 days = 3)       Expected Length of Stay 4d 2h   Actual Length of Stay 3

## 2017-03-15 NOTE — Progress Notes (Addendum)
Verbal shift change report given to Marquis Lunch, student nurse (oncoming nurse) by Serina Cowper, RN (offgoing nurse). Report included the following information SBAR, Kardex, ED Summary, Intake/Output, MAR, Recent Results, Med Rec Status, Cardiac Rhythm NSR, Alarm Parameters  and Quality Measures.

## 2017-03-15 NOTE — Progress Notes (Signed)
PCU SHIFT NURSING NOTE    Bedside and Verbal shift change report given to Pecolia Ades, RN (oncoming nurse) by Serina Cowper, RN (offgoing nurse). Report included the following information SBAR, Kardex, ED Summary, Procedure Summary, Intake/Output, MAR, Recent Results, Med Rec Status, Cardiac Rhythm NSR, Alarm Parameters  and Quality Measures.    Shift Summary:         Admission Date 03/11/2017   Admission Diagnosis Acute respiratory failure with hypoxia (HCC)   Consults IP CONSULT TO HOSPITALIST  IP CONSULT TO CARDIOLOGY  IP CONSULT TO PALLIATIVE CARE - PROVIDER  IP CONSULT TO INTENSIVIST        Consults   PT   OT   Speech   Case Management       Palliative      Cardiac Monitoring Order   Yes   No     IV drips   Yes    Drip:                            Dose:  Drip:                            Dose:  Drip:                            Dose:   No     GI Prophylaxis   Yes   No         DVT Prophylaxis   SCDs:             Ted stockings:          Medication   Contraindicated   None      Activity Level Activity Level: Up with Assistance     Activity Assistance: Partial (two people)   Purposeful Rounding every 1-2 hour?   Yes   Schmidt Score  Total Score: 3   Bed Alarm (If score 3 or >)   Yes    Refused (See signed refusal form in chart)   Braden Score  Braden Score: 16   Braden Score (if score 14 or less)   PMT consult   Wound Care consult      Specialty bed    Nutrition consult          Needs prior to discharge:   Home O2 required:    Yes   No    If yes, how much O2 required?    Other:    Last Bowel Movement: Last Bowel Movement Date: 03/11/17      Influenza Vaccine Received Flu Vaccine for Current Season (usually Sept-March): No    Patient/Guardian Refused (Notify MD): Yes   Pneumonia Vaccine           Diet Active Orders   Diet    DIET CARDIAC Regular      LDAs               Peripheral IV 03/12/17 Left Antecubital (Active)    Site Assessment Clean, dry, & intact 03/15/2017  4:14 AM   Phlebitis Assessment 0 03/15/2017  4:14 AM   Infiltration Assessment 0 03/15/2017  4:14 AM   Dressing Status Clean, dry, & intact 03/15/2017  4:14 AM   Dressing Type Tape;Transparent 03/15/2017  4:14 AM   Hub Color/Line Status Pink 03/15/2017  4:14 AM   Action Taken Open ports on tubing capped  03/14/2017  6:00 PM   Alcohol Cap Used Yes 03/14/2017  6:00 PM          External Female Catheter 03/12/17 (Active)   Site Assessment Clean, dry, & intact 03/15/2017  4:14 AM   Repositioned Yes 03/15/2017  4:14 AM   Perineal Care Yes 03/15/2017  4:14 AM   Wick Changed Yes 03/15/2017  4:14 AM   Suction Canister/Tubing Changed Yes 03/15/2017  4:14 AM   Urine Output (mL) 200 ml 03/15/2017  4:14 AM                Urinary Catheter      Intake & Output   Date 03/14/17 0700 - 03/15/17 0659 03/15/17 0700 - 03/16/17 0659   Shift 0700-1859 1900-0659 24 Hour Total 0700-1859 1900-0659 24 Hour Total   I  N  T  A  K  E   Shift Total  (mL/kg)         O  U  T  P  U  T   Urine  (mL/kg/hr) 2150  (1.3) 300  (0.2) 2450  (0.8)         Urine Occurrence(s)  1 x 1 x         Urine Output (mL) (External Female Catheter 03/12/17) 2150 300 2450       Shift Total  (mL/kg) 2150  (16.2) 300  (2.3) 2450  (18.4)      NET -2150 -300 -2450      Weight (kg) 132.9 132.9 132.9 132.9 132.9 132.9         Readmission Risk Assessment Tool Score High Risk            33       Total Score        3 Has Seen PCP in Last 6 Months (Yes=3, No=0)    4 IP Visits Last 12 Months (1-3=4, 4=9, >4=11)    9 Pt. Coverage (Medicare=5 , Medicaid, or Self-Pay=4)    17 Charlson Comorbidity Score (Age + Comorbid Conditions)        Criteria that do not apply:    Married. Living with Significant Other. Assisted Living. LTAC. SNF. or   Rehab    Patient Length of Stay (>5 days = 3)       Expected Length of Stay 4d 2h   Actual Length of Stay 3

## 2017-03-16 ENCOUNTER — Inpatient Hospital Stay: Admit: 2017-03-16 | Payer: MEDICARE | Primary: Internal Medicine

## 2017-03-16 ENCOUNTER — Encounter: Attending: Internal Medicine | Primary: Internal Medicine

## 2017-03-16 LAB — CBC W/O DIFF
ABSOLUTE NRBC: 0 10*3/uL (ref 0.00–0.01)
HCT: 37.1 % (ref 35.0–47.0)
HGB: 11.7 g/dL (ref 11.5–16.0)
MCH: 27.1 PG (ref 26.0–34.0)
MCHC: 31.5 g/dL (ref 30.0–36.5)
MCV: 86.1 FL (ref 80.0–99.0)
MPV: 10.8 FL (ref 8.9–12.9)
NRBC: 0 PER 100 WBC
PLATELET: 226 10*3/uL (ref 150–400)
RBC: 4.31 M/uL (ref 3.80–5.20)
RDW: 19.4 % — ABNORMAL HIGH (ref 11.5–14.5)
WBC: 8.9 10*3/uL (ref 3.6–11.0)

## 2017-03-16 LAB — GLUCOSE, POC
Glucose (POC): 178 mg/dL — ABNORMAL HIGH (ref 65–100)
Glucose (POC): 331 mg/dL — ABNORMAL HIGH (ref 65–100)
Glucose (POC): 229 mg/dL — ABNORMAL HIGH (ref 65–100)

## 2017-03-16 LAB — METABOLIC PANEL, BASIC
Anion gap: 3 mmol/L — ABNORMAL LOW (ref 5–15)
BUN/Creatinine ratio: 30 — ABNORMAL HIGH (ref 12–20)
BUN: 28 MG/DL — ABNORMAL HIGH (ref 6–20)
CO2: 36 mmol/L — ABNORMAL HIGH (ref 21–32)
Calcium: 8 MG/DL — ABNORMAL LOW (ref 8.5–10.1)
Chloride: 97 mmol/L (ref 97–108)
Creatinine: 0.94 MG/DL (ref 0.55–1.02)
GFR est AA: >60 mL/min/{1.73_m2} (ref >60)
GFR est non-AA: 59 mL/min/{1.73_m2} — ABNORMAL LOW (ref >60)
Glucose: 190 mg/dL — ABNORMAL HIGH (ref 65–100)
Potassium: 4.1 mmol/L (ref 3.5–5.1)
Sodium: 136 mmol/L (ref 136–145)

## 2017-03-16 MED ORDER — LIDOCAINE (PF) 10 MG/ML (1 %) IJ SOLN
10 mg/mL (1 %) | Freq: Once | INTRAMUSCULAR | Status: AC
Start: 2017-03-16 — End: 2017-03-16
  Administered 2017-03-16: 16:00:00 via INTRADERMAL

## 2017-03-16 MED ORDER — METOPROLOL SUCCINATE SR 25 MG 24 HR TAB
25 mg | Freq: Every day | ORAL | Status: DC
Start: 2017-03-16 — End: 2017-04-04
  Administered 2017-03-17 – 2017-04-04 (×19): via ORAL

## 2017-03-16 MED ORDER — LIDOCAINE HCL 1 % (10 MG/ML) IJ SOLN
10 mg/mL (1 %) | Freq: Once | INTRAMUSCULAR | Status: DC
Start: 2017-03-16 — End: 2017-03-16
  Administered 2017-03-16: 14:00:00 via INTRADERMAL

## 2017-03-16 MED ORDER — METHYLPREDNISOLONE 40 MG/ML SUSP FOR INJECTION
40 mg/mL | Freq: Once | INTRAMUSCULAR | Status: AC
Start: 2017-03-16 — End: 2017-03-16
  Administered 2017-03-16: 16:00:00 via INTRA_ARTICULAR

## 2017-03-16 MED ORDER — FUROSEMIDE 10 MG/ML IJ SOLN
10 mg/mL | Freq: Two times a day (BID) | INTRAMUSCULAR | Status: DC
Start: 2017-03-16 — End: 2017-03-25
  Administered 2017-03-16 – 2017-03-25 (×19): via INTRAVENOUS

## 2017-03-16 MED FILL — ACETYLCYSTEINE 20 % (200 MG/ML) SOLN: 200 mg/mL (20 %) | Qty: 4

## 2017-03-16 MED FILL — METHOCARBAMOL 500 MG TAB: 500 mg | ORAL | Qty: 1

## 2017-03-16 MED FILL — LIDOCAINE HCL 1 % (10 MG/ML) IJ SOLN: 10 mg/mL (1 %) | INTRAMUSCULAR | Qty: 6

## 2017-03-16 MED FILL — AMLODIPINE 5 MG TAB: 5 mg | ORAL | Qty: 1

## 2017-03-16 MED FILL — IPRATROPIUM-ALBUTEROL 2.5 MG-0.5 MG/3 ML NEB SOLUTION: 2.5 mg-0.5 mg/3 ml | RESPIRATORY_TRACT | Qty: 3

## 2017-03-16 MED FILL — PRAVASTATIN 10 MG TAB: 10 mg | ORAL | Qty: 2

## 2017-03-16 MED FILL — SOLU-MEDROL (PF) 125 MG/2 ML SOLUTION FOR INJECTION: 125 mg/2 mL | INTRAMUSCULAR | Qty: 2

## 2017-03-16 MED FILL — XYLOCAINE-MPF 10 MG/ML (1 %) INJECTION SOLUTION: 10 mg/mL (1 %) | INTRAMUSCULAR | Qty: 5

## 2017-03-16 MED FILL — TOPROL XL 50 MG TABLET,EXTENDED RELEASE: 50 mg | ORAL | Qty: 1

## 2017-03-16 MED FILL — LOVENOX 40 MG/0.4 ML SUBCUTANEOUS SYRINGE: 40 mg/0.4 mL | SUBCUTANEOUS | Qty: 0.4

## 2017-03-16 MED FILL — FUROSEMIDE 10 MG/ML IJ SOLN: 10 mg/mL | INTRAMUSCULAR | Qty: 4

## 2017-03-16 MED FILL — FUROSEMIDE 40 MG TAB: 40 mg | ORAL | Qty: 1

## 2017-03-16 MED FILL — ASPIRIN 81 MG TAB, DELAYED RELEASE: 81 mg | ORAL | Qty: 1

## 2017-03-16 MED FILL — LEVOFLOXACIN 500 MG TAB: 500 mg | ORAL | Qty: 1

## 2017-03-16 MED FILL — DEPO-MEDROL 40 MG/ML SUSPENSION FOR INJECTION: 40 mg/mL | INTRAMUSCULAR | Qty: 1

## 2017-03-16 MED FILL — ENOXAPARIN 40 MG/0.4 ML SUB-Q SYRINGE: 40 mg/0.4 mL | SUBCUTANEOUS | Qty: 0.4

## 2017-03-16 NOTE — Consults (Signed)
ORTHOPAEDIC CONSULT NOTE    Subjective:     Date of Consultation:  March 16, 2017      Brittney Shelton is a 66 y.o. female who is being seen for LPB and left knee pain.  Pt reports chronic complaints- admitted July 2018 for similar complaints.  Pain across the entire LB, Left anterior knee pain, symptoms worse with change of position, wt bearing, ambulation.  Has had knees injected by Dr Janann August as in-pt.  NO out-pt eval or FU.   Pt denies new injury or trauma.  Ambulates at baseline with rolling walker.     Denies radicular pain.  Denies new numbness or paraesthesia of the LE. Denis bowel or bladder incontinences.   Pt seated in chair- was able to transfer w/ minimal assistance/walker this morning.   RN noted bruise central lumbar region.  Patient Active Problem List    Diagnosis Date Noted   ??? Acute respiratory failure with hypoxia (HCC) 03/12/2017   ??? Angioedema 01/06/2017   ??? 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 Ambulatory dysfunction 12/18/2016   ??? Intractable back pain 12/17/2016   ??? 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 Obesity, morbid (HCC) 05/21/2016   ??? Acute on chronic diastolic (congestive) heart failure (HCC) 01/23/2016   ??? Obesity 07/27/2015   ??? Requires continuous at home supplemental oxygen 07/27/2015   ??? Dependence on supplemental oxygen 07/27/2015   ??? ACP (advance care planning) 07/27/2015   ??? Counseling regarding advanced care planning and goals of care 07/27/2015   ??? Dyspnea 07/27/2015   ??? COPD (chronic obstructive pulmonary disease) (HCC) 07/26/2015   ??? Well controlled type 2 diabetes mellitus (HCC) 09/29/2014   ??? Hypercholesterolemia 09/29/2014   ??? 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 Chronic acquired lymphedema 09/29/2014     Family History   Problem Relation Age of Onset   ??? Diabetes Mother    ??? Arthritis-osteo Father    ??? COPD Sister    ??? Diabetes Brother    ??? Diabetes Brother       Social History   Substance Use Topics    ??? Smoking status: Never Smoker   ??? Smokeless tobacco: Never Used   ??? Alcohol use No     Past Medical History:   Diagnosis Date   ??? Chronic obstructive pulmonary disease (HCC)    ??? Congestive heart failure (HCC)    ??? Diabetes (HCC)    ??? Hypertension       History reviewed. No pertinent surgical history.   Prior to Admission medications    Medication Sig Start Date End Date Taking? Authorizing Provider   furosemide (LASIX) 40 mg tablet Take 40 mg by mouth three (3) times daily.   Yes Historical Provider   umeclidinium-vilanterol (ANORO ELLIPTA) 62.5-25 mcg/actuation inhaler Take 1 Puff by inhalation as needed.   Yes Historical Provider   lisinopril (PRINIVIL, ZESTRIL) 40 mg tablet Take 40 mg by mouth daily.   Yes Historical Provider   metoprolol succinate (TOPROL-XL) 200 mg XL tablet TAKE 1 TABLET BY MOUTH ONCE DAILY FOR HIGH BLOOD PRESSURE 02/09/17  Yes Nau D Domah, MD   pravastatin (PRAVACHOL) 20 mg tablet TAKE 1 TABLET BY MOUTH DAILY AT NIGHT 02/09/17  Yes Nau D Domah, MD   amLODIPine (NORVASC) 10 mg tablet TAKE 1 TABLET BY MOUTH DAILY 02/05/17  Yes Erenest Rasher, MD   aspirin delayed-release 81 mg tablet Take 81 mg by  mouth daily.   Yes Historical Provider   isosorbide dinitrate (ISORDIL) 30 mg tablet TAKE 1 TABLET BY MOUTH THREE TIMES DAILY 05/13/16  Yes Nau D Domah, MD   cloNIDine HCl (CATAPRES) 0.1 mg tablet TAKE 1 TABLET BY MOUTH ONCE DAILY 02/16/16  Yes Erenest Rasher, MD   hydrALAZINE (APRESOLINE) 10 mg tablet Take 1 Tab by mouth three (3) times daily. 01/07/17   Mushtaq Anis, MD   naproxen sodium (ALEVE) 220 mg tablet Take 440 mg by mouth every twelve (12) hours as needed for Pain.    Historical Provider     Current Facility-Administered Medications   Medication Dose Route Frequency   ??? methylPREDNISolone acetate (DEPO-MEDROL) 40 mg/mL injection 40 mg  40 mg Intra artICUlar ONCE   ??? furosemide (LASIX) injection 40 mg  40 mg IntraVENous BID   ??? lidocaine (PF) (XYLOCAINE) 10 mg/mL (1 %) injection 5 mL  5 mL  IntraDERMal ONCE   ??? methylPREDNISolone (PF) (SOLU-MEDROL) injection 60 mg  60 mg IntraVENous Q6H   ??? albuterol-ipratropium (DUO-NEB) 2.5 MG-0.5 MG/3 ML  3 mL Nebulization Q6H RT   ??? acetylcysteine (MUCOMYST) 200 mg/mL (20 %) solution 600 mg  600 mg Oral BID   ??? enoxaparin (LOVENOX) injection 40 mg  40 mg SubCUTAneous Q12H   ??? acetaminophen (TYLENOL) tablet 650 mg  650 mg Oral Q6H PRN   ??? HYDROcodone-acetaminophen (NORCO) 5-325 mg per tablet 1 Tab  1 Tab Oral Q6H PRN   ??? levoFLOXacin (LEVAQUIN) tablet 500 mg  500 mg Oral Q24H   ??? metoprolol succinate (TOPROL-XL) XL tablet 50 mg  50 mg Oral DAILY   ??? pravastatin (PRAVACHOL) tablet 20 mg  20 mg Oral QHS   ??? aspirin delayed-release tablet 81 mg  81 mg Oral DAILY   ??? methocarbamol (ROBAXIN) tablet 500 mg  500 mg Oral QID   ??? umeclidinium-vilanterol (ANORO ELLIPTA) 62.5 mcg- 25 mcg/inhalation  1 Puff Inhalation DAILY   ??? amLODIPine (NORVASC) tablet 5 mg  5 mg Oral DAILY      Allergies   Allergen Reactions   ??? Lisinopril Angioedema        Review of Systems:  A comprehensive review of systems was negative except for that written in the HPI.    Mental Status: no dementia    Objective:     Patient Vitals for the past 8 hrs:   BP Temp Pulse Resp SpO2   03/16/17 1105 136/67 97.3 ??F (36.3 ??C) 83 16 97 %   03/16/17 0737 144/72 98.2 ??F (36.8 ??C) 77 20 97 %   03/16/17 0347 153/71 98.4 ??F (36.9 ??C) 77 20 98 %     Temp (24hrs), Avg:98.2 ??F (36.8 ??C), Min:97.3 ??F (36.3 ??C), Max:98.7 ??F (37.1 ??C)      Gen: Well-developed,  in no acute distress, morbid obesity   HEENT: Pink conjunctivae, hearing intact to voice, moist mucous membranes   Neck: Supple  Resp: high flow oxygen (20L)  Card: RRR, palpable distal pulse-equal bilaterally, birsk cap refill all distal digits   Musc: left knee no effusion, no erythema, 5 deg flex contracture, flex to 75.  No val/var instability. No sing of LE VTE, difficulty w// SLR, intact ext mech.  5/5 strength MMT of the quad, ant tib, gastroc, EHL/FHL.   Skin: No skin breakdown noted. Skin warm, pink, dry  Neuro: Cranial nerves are grossly intact, no focal motor weakness, follows commands appropriately   Psych: Good insight, oriented to person, place and time,  alert    Imaging Review: XR KNEE LT MIN 4 V  INDICATION:   intractable pain.  COMPARISON: None.  FINDINGS: Four views of the left knee demonstrate no fracture or other acute  osseous or articular abnormality.  There is no effusion.  There is  tricompartmental joint space narrowing, mild medial subluxation of the femur  relative to the tibia, tricompartmental marginal osteophytes, and tibial spine  peaking.  IMPRESSION:  No acute abnormality. Advanced osteoarthritis    XR SPINE LUMB 2 OR 3 V  COMPARISON: None.  FINDINGS: AP, lateral and spot lateral views of the lumbar spine demonstrate  normal alignment.  There is marked disc space narrowing at L4-L5. There are  degenerative disc changes L3-L4 and L5-S1 levels. There is facet arthropathy  L3-S1.  No fracture or acute abnormality identified..   IMPRESSION:  No fracture or acute abnormality identified. There are degenerative  disc changes L3-S1 with marked disc space narrowing at L4-L5 and facet  arthropathy L3-S1.  ??  Labs:   Recent Results (from the past 24 hour(s))   GLUCOSE, POC    Collection Time: 03/15/17 11:37 AM   Result Value Ref Range    Glucose (POC) 274 (H) 65 - 100 mg/dL    Performed by Buford Dresser    GLUCOSE, POC    Collection Time: 03/15/17  4:11 PM   Result Value Ref Range    Glucose (POC) 205 (H) 65 - 100 mg/dL    Performed by Algis Greenhouse Nicolette    CBC W/O DIFF    Collection Time: 03/16/17  3:50 AM   Result Value Ref Range    WBC 8.9 3.6 - 11.0 K/uL    RBC 4.31 3.80 - 5.20 M/uL    HGB 11.7 11.5 - 16.0 g/dL    HCT 04.5 40.9 - 81.1 %    MCV 86.1 80.0 - 99.0 FL    MCH 27.1 26.0 - 34.0 PG    MCHC 31.5 30.0 - 36.5 g/dL    RDW 91.4 (H) 78.2 - 14.5 %    PLATELET 226 150 - 400 K/uL    MPV 10.8 8.9 - 12.9 FL    NRBC 0.0 0 PER 100 WBC     ABSOLUTE NRBC 0.00 0.00 - 0.01 K/uL   METABOLIC PANEL, BASIC    Collection Time: 03/16/17  3:50 AM   Result Value Ref Range    Sodium 136 136 - 145 mmol/L    Potassium 4.1 3.5 - 5.1 mmol/L    Chloride 97 97 - 108 mmol/L    CO2 36 (H) 21 - 32 mmol/L    Anion gap 3 (L) 5 - 15 mmol/L    Glucose 190 (H) 65 - 100 mg/dL    BUN 28 (H) 6 - 20 MG/DL    Creatinine 9.56 2.13 - 1.02 MG/DL    BUN/Creatinine ratio 30 (H) 12 - 20      GFR est AA >60 >60 ml/min/1.71m2    GFR est non-AA 59 (L) >60 ml/min/1.40m2    Calcium 8.0 (L) 8.5 - 10.1 MG/DL   GLUCOSE, POC    Collection Time: 03/16/17  7:13 AM   Result Value Ref Range    Glucose (POC) 178 (H) 65 - 100 mg/dL    Performed by Victoriano Lain    GLUCOSE, POC    Collection Time: 03/16/17 11:10 AM   Result Value Ref Range    Glucose (POC) 331 (H) 65 - 100 mg/dL    Performed by Lalla Brothers  [place]          Impression:     Patient Active Problem List    Diagnosis Date Noted   ??? Acute respiratory failure with hypoxia (HCC) 03/12/2017   ??? Angioedema 01/06/2017   ??? 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 Ambulatory dysfunction 12/18/2016   ??? Intractable back pain 12/17/2016   ??? 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 Obesity, morbid (HCC) 05/21/2016   ??? Acute on chronic diastolic (congestive) heart failure (HCC) 01/23/2016   ??? Obesity 07/27/2015   ??? Requires continuous at home supplemental oxygen 07/27/2015   ??? Dependence on supplemental oxygen 07/27/2015   ??? ACP (advance care planning) 07/27/2015   ??? Counseling regarding advanced care planning and goals of care 07/27/2015   ??? Dyspnea 07/27/2015   ??? COPD (chronic obstructive pulmonary disease) (HCC) 07/26/2015   ??? Well controlled type 2 diabetes mellitus (HCC) 09/29/2014   ??? Hypercholesterolemia 09/29/2014   ??? 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 Chronic acquired lymphedema 09/29/2014     Active Problems:    Hypercholesterolemia (09/29/2014)       COPD (chronic obstructive pulmonary disease) (HCC) (07/26/2015)      Type 2 diabetes with nephropathy (HCC) (12/10/2016)      Acute respiratory failure with hypoxia (HCC) (03/12/2017)        Plan:     Chronic LBP- NO radiculopathy or myleopathy  Left knee advanced DJD- no sign of infection    Left knee injection - after gaining consent and under sterile conditions - left knee was injected with depo /1cc, lido 1%/5cc.  Via antero-lateral joint approach. Band-aid place. There were no complication.      PT/OT- WBAT with walker  Consider zanaflex- which she has tolerated in the past.    Out pt FU with ortho as needed       Dr. Garner Nash aware and agrees with plan as above.        Leanor Rubenstein, PA-C  [employer]

## 2017-03-16 NOTE — Progress Notes (Signed)
Problem: Self Care Deficits Care Plan (Adult)  Goal: *Acute Goals and Plan of Care (Insert Text)  Occupational Therapy Goals  Initiated 03/12/2017    1.  Patient will perform simple grooming tasks in full bed/chair position for at least 5 minutes within 7 days. Upgrade to standing with modified independence  2.  Patient will participate in anterior bathing with Min A from seated position within 7 days.  3.  Patient will perform rolling in bed with Mod A x1 in preparation for toileting and hygiene within 7 days.  4.  Patient will perform toilet transfer with overall Mod A x2 and least restrictive device PRN within 7 days. Upgrade to modified independence 03/16/2017  5.  Patient will participate in all aspects of toileting with overall Mod A x1 within 7 days.  6.  Patient will complete upper body therapeutic exercises/activities to maximize independence and safety with ADLs for at least 5 minutes within 7 days.   Occupational Therapy TREATMENT  Patient: Brittney Shelton [phone] y.o. female)  Date: 03/16/2017  Diagnosis: Acute respiratory failure with hypoxia (HCC) <principal problem not specified>       Precautions: Fall, DNR  Chart, occupational therapy assessment, plan of care, and goals were reviewed.    ASSESSMENT:  Pt has met 2 goals from initial eval and all goals have been updated.  Pt remains on high flow this session 20L NC at 93% FiO2.  O2 sat at the lowest with a good pleth was 90% with activity.  Pt was seated at bedside chair upon arrival.  SBA for sit to stand on 2 trials.  Pt was able to mobilize within the distance of high flow tubing ~15 feet forward and back x2 with seated rest break. Stood to brush teeth at tray table with supervision.  Pt is making steady progress and is mainly limited by high O2 needs.    Progression toward goals:  _0        Improving appropriately and progressing toward goals  _1        Improving slowly and progressing toward goals   _2        Not making progress toward goals and plan of care will be adjusted     PLAN:  Patient continues to benefit from skilled intervention to address the above impairments.  Continue treatment per established plan of care.  Discharge Recommendations: SNF vs home health pending progress and decreased O2 needs  Further Equipment Recommendations for Discharge:  none     SUBJECTIVE:   Patient stated ???I will do whatever I need to do to go home.???    OBJECTIVE DATA SUMMARY:   Cognitive/Behavioral Status:  Neurologic State: Alert  Orientation Level: Oriented X4  Cognition: Follows commands  Perception: Appears intact  Perseveration: No perseveration noted  Safety/Judgement: Awareness of environment;Fall prevention  Functional Mobility and Transfers for ADLs:Transfers:  Sit to Stand: Stand-by assistance          Balance:  Sitting: Intact  Standing: Intact;With support  Standing - Static: Good  Standing - Dynamic : Fair  ADL Intervention:       Grooming  Brushing Teeth: Supervision/set-up (standing in front of bedside chair)         Cognitive Retraining  Safety/Judgement: Awareness of environment;Fall prevention  Pain:  Pain Scale 1: Numeric (0 - 10)  Pain Intensity 1: 0              Activity Tolerance:     Please refer to the flowsheet for vital  signs taken during this treatment.  After treatment:   _0  Patient left in no apparent distress sitting up in chair  _1  Patient left in no apparent distress in bed  _2  Call bell left within reach  _3  Nursing notified  _4  Caregiver present  _5  Bed alarm activated    COMMUNICATION/COLLABORATION:   The patient???s plan of care was discussed with: Physical Therapist, Registered Nurse and patient    Meredeth Ide, OTR/L  Time Calculation: 27 mins

## 2017-03-16 NOTE — Other (Signed)
DTC Progress Note    Recommendations/ Comments: If appropriate, please consider adding correctional insulin - high sensitivity scale ac and hs and reassess need to increase towards normals sensitivity.     Current hospital DM medication: none    Chart reviewed on Brittney Shelton.    Patient is a 66 y.o. female with known DM  at home.    A1c:   Lab Results   Component Value Date/Time    Hemoglobin A1c 6.2 (H) 09/12/2016 11:14 AM    Hemoglobin A1c 6.7 (H) 03/13/2016 10:56 AM       Recent Glucose Results: Lab Results   Component Value Date/Time    GLU 190 (H) 03/16/2017 03:50 AM    GLUCPOC 331 (H) 03/16/2017 11:10 AM    GLUCPOC 178 (H) 03/16/2017 07:13 AM    GLUCPOC 205 (H) 03/15/2017 04:11 PM        Lab Results   Component Value Date/Time    Creatinine 0.94 03/16/2017 03:50 AM     Estimated Creatinine Clearance: 80.2 mL/min (based on Cr of 0.94).    Active Orders   Diet    DIET CARDIAC Regular        PO intake: Patient Vitals for the past 72 hrs:   % Diet Eaten   03/15/17 0847 100 %       Will continue to follow as needed.    Thank you  Claris Gower RD CDE  Diabetes [facility]

## 2017-03-16 NOTE — Progress Notes (Addendum)
PCU SHIFT NURSING NOTE    Bedside shift change report given to Achille Rich RN (oncoming nurse) by Reuel Boom RN (offgoing nurse). Report included the following information SBAR, Kardex, Intake/Output, MAR, Recent Results and Cardiac Rhythm NSR.    Shift Summary:   1915: Pt sitting up in recliner in no distress talking on phone. Denies needs at this time. Call bell within reach. Chair alarm active.  2030: Assisted pt back to bed c walker, 2 standby assist. Pt transfers well c mild dyspnea. Bed alarm active.   0108: Pt SpO2 dropping to 78-80%; at bedside, pt having apneic episode. Pt rouses easily and SpO2 recovers to 94%.  0700: Bedside shift change report given to Clarisse Gouge A RN and Engineer, water (oncoming nurse) by Achille Rich RN (offgoing nurse). Report included the following information SBAR, Kardex, Intake/Output, MAR, Recent Results and Cardiac Rhythm NSR.      Admission Date 03/11/2017   Admission Diagnosis Acute respiratory failure with hypoxia (HCC)   Consults IP CONSULT TO HOSPITALIST  IP CONSULT TO CARDIOLOGY  IP CONSULT TO PALLIATIVE CARE - PROVIDER  IP CONSULT TO INTENSIVIST  IP CONSULT TO ORTHOPEDIC SURGERY        Consults   PT   OT   Speech   Case Management       Palliative      Cardiac Monitoring Order   Yes   No     IV drips   Yes    Drip:                            Dose:  Drip:                            Dose:  Drip:                            Dose:   No     GI Prophylaxis   Yes   No         DVT Prophylaxis   SCDs:             Ted stockings:          Medication   Contraindicated   None      Activity Level Activity Level: Recliner     Activity Assistance: Partial (two people)   Purposeful Rounding every 1-2 hour?   Yes   Schmidt Score  Total Score: 3   Bed Alarm (If score 3 or >)   Yes    Refused (See signed refusal form in chart)   Braden Score  Braden Score: 17   Braden Score (if score 14 or less)   PMT consult   Wound Care consult      Specialty bed     Nutrition consult          Needs prior to discharge:   Home O2 required:    Yes   No    If yes, how much O2 required?    Other:    Last Bowel Movement: Last Bowel Movement Date: 03/15/17      Influenza Vaccine Received Flu Vaccine for Current Season (usually Sept-March): No    Patient/Guardian Refused (Notify MD): Yes   Pneumonia Vaccine           Diet Active Orders   Diet    DIET CARDIAC Regular  LDAs               Peripheral IV 03/12/17 Left Antecubital (Active)   Site Assessment Clean, dry, & intact 03/16/2017  7:21 PM   Phlebitis Assessment 0 03/16/2017  7:21 PM   Infiltration Assessment 0 03/16/2017  7:21 PM   Dressing Status Clean, dry, & intact 03/16/2017  7:21 PM   Dressing Type Transparent 03/16/2017  7:21 PM   Hub Color/Line Status Pink;Patent;Flushed;Capped 03/16/2017  7:21 PM   Action Taken Open ports on tubing capped 03/15/2017  6:00 PM   Alcohol Cap Used Yes 03/15/2017  6:00 PM          External Female Catheter 03/12/17 (Active)   Site Assessment Clean, dry, & intact 03/16/2017  7:21 PM   Repositioned No 03/16/2017  7:21 PM   Perineal Care No 03/16/2017  7:21 PM   Wick Changed No 03/16/2017  7:21 PM   Suction Canister/Tubing Changed No 03/16/2017  7:21 PM   Urine Output (mL) 200 ml 03/16/2017  7:21 PM                Urinary Catheter      Intake & Output   Date 03/16/17 0700 - 03/17/17 0659 03/17/17 0700 - 03/18/17 0659   Shift 0700-1859 1900-0659 24 Hour Total 0700-1859 1900-0659 24 Hour Total   I  N  T  A  K  E   P.O.  240 240         P.O.  240 240       Shift Total  (mL/kg)  240  (1.8) 240  (1.8)      O  U  T  P  U  T   Urine  (mL/kg/hr) 1550  (1) 200 1750         Urine Occurrence(s) 2 x  2 x         Urine Output (mL) (External Female Catheter 03/12/17) 1550 200 1750       Shift Total  (mL/kg) 1550  (11.7) 200  (1.5) 1750  (13.2)      NET -1550 40 -1510      Weight (kg) 132.9 132.9 132.9 132.9 132.9 132.9         Readmission Risk Assessment Tool Score High Risk            36        Total Score        3 Has Seen PCP in Last 6 Months (Yes=3, No=0)    3 Patient Length of Stay (>5 days = 3)    4 IP Visits Last 12 Months (1-3=4, 4=9, >4=11)    9 Pt. Coverage (Medicare=5 , Medicaid, or Self-Pay=4)    17 Charlson Comorbidity Score (Age + Comorbid Conditions)        Criteria that do not apply:    Married. Living with Significant Other. Assisted Living. LTAC. SNF. or   Rehab       Expected Length of Stay 4d 2h   Actual Length of Stay 5

## 2017-03-16 NOTE — Progress Notes (Signed)
PCU SHIFT NURSING NOTE    Bedside and Verbal shift change report given to Clarisse Gouge, Charity fundraiser (Cabin crew) by Lequita Halt, RN (offgoing nurse). Report included the following information SBAR, Kardex, Intake/Output, MAR, Recent Results and Cardiac Rhythm NSR.    Shift Summary:       Admission Date 03/11/2017   Admission Diagnosis Acute respiratory failure with hypoxia (HCC)   Consults IP CONSULT TO HOSPITALIST  IP CONSULT TO CARDIOLOGY  IP CONSULT TO PALLIATIVE CARE - PROVIDER  IP CONSULT TO INTENSIVIST  IP CONSULT TO ORTHOPEDIC SURGERY        Consults   PT   OT   Speech   Case Management       Palliative      Cardiac Monitoring Order   Yes   No     IV drips   Yes    Drip:                            Dose:  Drip:                            Dose:  Drip:                            Dose:   No     GI Prophylaxis   Yes   No         DVT Prophylaxis   SCDs:             Ted stockings:          Medication   Contraindicated   None      Activity Level Activity Level: Up with Assistance     Activity Assistance: Partial (two people)   Purposeful Rounding every 1-2 hour?   Yes   Schmidt Score  Total Score: 3   Bed Alarm (If score 3 or >)   Yes    Refused (See signed refusal form in chart)   Braden Score  Braden Score: 16   Braden Score (if score 14 or less)   PMT consult   Wound Care consult      Specialty bed    Nutrition consult          Needs prior to discharge:   Home O2 required:    Yes   No    If yes, how much O2 required?    Other:    Last Bowel Movement: Last Bowel Movement Date: 03/15/17      Influenza Vaccine Received Flu Vaccine for Current Season (usually Sept-March): No    Patient/Guardian Refused (Notify MD): Yes   Pneumonia Vaccine           Diet Active Orders   Diet    DIET CARDIAC Regular      LDAs               Peripheral IV 03/12/17 Left Antecubital (Active)   Site Assessment Clean, dry, & intact 03/16/2017  3:47 AM   Phlebitis Assessment 0 03/16/2017  3:47 AM    Infiltration Assessment 0 03/16/2017  3:47 AM   Dressing Status Clean, dry, & intact 03/16/2017  3:47 AM   Dressing Type Tape;Transparent 03/16/2017  3:47 AM   Hub Color/Line Status Pink;Flushed 03/16/2017  3:47 AM   Action Taken Open ports on tubing capped 03/15/2017  6:00 PM   Alcohol Cap Used Yes  03/15/2017  6:00 PM          External Female Catheter 03/12/17 (Active)   Site Assessment Clean, dry, & intact 03/16/2017  3:47 AM   Repositioned Yes 03/16/2017  3:47 AM   Perineal Care Yes 03/16/2017  3:47 AM   Wick Changed Yes 03/16/2017  3:47 AM   Suction Canister/Tubing Changed Yes 03/16/2017  3:47 AM   Urine Output (mL) 600 ml 03/16/2017  3:47 AM                Urinary Catheter      Intake & Output   Date 03/15/17 0700 - 03/16/17 0659 03/16/17 0700 - 03/17/17 0659   Shift 0700-1859 1900-0659 24 Hour Total 0700-1859 1900-0659 24 Hour Total   I  N  T  A  K  E   P.O. 1160  1160         P.O. 1160  1160       Shift Total  (mL/kg) 1160  (8.7)  1160  (8.7)      O  U  T  P  U  T   Urine  (mL/kg/hr) 1325  (0.8) 600  (0.4) 1925  (0.6)         Urine Output (mL) (External Female Catheter 03/12/17) 1325 600 1925       Stool            Stool Occurrence(s) 1 x  1 x       Shift Total  (mL/kg) 1325  (10) 600  (4.5) 1925  (14.5)      NET -165 -600 -765      Weight (kg) 132.9 132.9 132.9 132.9 132.9 132.9         Readmission Risk Assessment Tool Score High Risk            33       Total Score        3 Has Seen PCP in Last 6 Months (Yes=3, No=0)    4 IP Visits Last 12 Months (1-3=4, 4=9, >4=11)    9 Pt. Coverage (Medicare=5 , Medicaid, or Self-Pay=4)    17 Charlson Comorbidity Score (Age + Comorbid Conditions)        Criteria that do not apply:    Married. Living with Significant Other. Assisted Living. LTAC. SNF. or   Rehab    Patient Length of Stay (>5 days = 3)       Expected Length of Stay 4d 2h   Actual Length of Stay 4

## 2017-03-16 NOTE — Progress Notes (Signed)
[address], [place], [place] [address]  [phone]  Cardiology Progress Note      03/16/2017 230 PM    Admit Date: 03/11/2017    Admit Diagnosis:   Acute respiratory failure with hypoxia (HCC)    Subjective:     Brittney Shelton is a 66 y.o. female with PMH COPD, HF, HTN, DM who was admitted for Acute respiratory failure with hypoxia (HCC).    Overnight events:  -VSS; BP slightly elevated  -labs steady  -no weight; I/O incomplete, but overall negative   -[name] states she is feeling well.  She "think"s that she is feeling better.  She is still having some back pain. She is uncertain about changes in her breathing, but she says the nursing are yelling at her to breath less than they had been.     Visit Vitals   ??? BP 136/67 (BP 1 Location: Right arm, BP Patient Position: Sitting)   ??? Pulse 83   ??? Temp 97.3 ??F (36.3 ??C)   ??? Resp 16   ??? Ht  (1.676 m)   ??? Wt 132.9 kg (292 lb 15.9 oz)   ??? SpO2 97%   ??? BMI 47.29 kg/m2       Current Facility-Administered Medications   Medication Dose Route Frequency   ??? furosemide (LASIX) injection 40 mg  40 mg IntraVENous BID   ??? albuterol-ipratropium (DUO-NEB) 2.5 MG-0.5 MG/3 ML  3 mL Nebulization Q6H RT   ??? acetylcysteine (MUCOMYST) 200 mg/mL (20 %) solution 600 mg  600 mg Oral BID   ??? enoxaparin (LOVENOX) injection 40 mg  40 mg SubCUTAneous Q12H   ??? acetaminophen (TYLENOL) tablet 650 mg  650 mg Oral Q6H PRN   ??? HYDROcodone-acetaminophen (NORCO) 5-325 mg per tablet 1 Tab  1 Tab Oral Q6H PRN   ??? levoFLOXacin (LEVAQUIN) tablet 500 mg  500 mg Oral Q24H   ??? metoprolol succinate (TOPROL-XL) XL tablet 50 mg  50 mg Oral DAILY   ??? pravastatin (PRAVACHOL) tablet 20 mg  20 mg Oral QHS   ??? aspirin delayed-release tablet 81 mg  81 mg Oral DAILY   ??? methocarbamol (ROBAXIN) tablet 500 mg  500 mg Oral QID   ??? umeclidinium-vilanterol (ANORO ELLIPTA) 62.5 mcg- 25 mcg/inhalation  1 Puff Inhalation DAILY   ??? amLODIPine (NORVASC) tablet 5 mg  5 mg Oral DAILY       Objective:       Physical Exam:  General: pleasant, obese AAF sitting in chair in NAD  Heart: RRR, 3/6 systolic murmur   Lungs: clear/dim   Abdomen: Soft, +BS, NTND   Extremities: LE bil +DP/PT, ? Lymphedema vs obesity.  Chronic appearance and difficult to determine if any edema.   Neurologic: Grossly normal  Skin:?? Warm and dry.??    Data Review:   Recent Labs      03/16/17   0350  03/15/17   0423  03/14/17   0341   WBC  8.9  5.6  5.5   HGB  11.7  12.5  11.1*   HCT  37.1  40.1  36.0   PLT  226  254  242     Recent Labs      03/16/17   0350  03/15/17   0423  03/14/17   0341   NA  136  138  140   K  4.1  4.5  3.8   CL  97  97  100   CO2  36*  36*  36*   GLU  190*  179*  97   BUN  28*  22*  22*   CREA  0.94  0.86  0.78   CA  8.0*  8.1*  7.9*   MG   --   1.9  1.8   PHOS   --   3.3  4.1       No results for input(s): TROIQ, CPK, CKMB in the last 72 hours.      Intake/Output Summary (Last 24 hours) at 03/16/17 1512  Last data filed at 03/16/17 0737   Gross per 24 hour   Intake              465 ml   Output             1350 ml   Net             -885 ml        Telemetry:  SR 1st degree  ECG: read as afib (tele review did not show a fib)    Echocardiogram: EF 60-65%; NWMA; mild TR; mod PHTN   CXRAY: "Study is compromised by patient's body habitus. There is question of a vague patchy density at the right lung base. PA and lateral views are recommended when the patient is stable for further assessment."  CTA chest: "No evidence of acute pulmonary embolus. Unchanged enlargement of the main pulmonary artery, compatible with pulmonary hypertension. Unchanged mediastinal lymphadenopathy. Bilateral indeterminate, ill-defined pulmonary nodules, measuring up to 5 mm; consider follow-up CT in 12 months to assure stability or  resolution. 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 CTA neg for PE.  Troponin neg.  proBNP [id number].  Patient has felt at her baseline.  COPD history.  ECHO shows pulmonary hypertension  ?? Remains on IV lasix and high flow oxygen   ?? Pulmonary following         HTN:  BP stable - slightly elevated  ?? Multiple home meds still on hold  ?? Continue lower dose of amlodipine  ?? Slightly increase the dose of toprol        [name], NP  DNP, RN, AGACNP-BC

## 2017-03-16 NOTE — Progress Notes (Signed)
[facility], Critical Care, and Sleep Medicine    Initial Patient Consult    Name: Brittney Shelton MRN: [id number]   DOB: Apr 20, 1951 Hospital: [facility]   Date: 03/16/2017        IMPRESSION:   ?? Acute respiratory failure  ?? OHS  ?? OSA  ?? Pulmonary HTN  ?? Pulmonary nodules      RECOMMENDATIONS:   ?? Wean O2  ?? Jet nebs  ?? Diurese   ?? Will need out pt pft's  ?? Will need repeat chest ct in 1 year     Subjective:     No acute events overnight  No acute distress  No acute complaints         Current Facility-Administered Medications   Medication Dose Route Frequency   ??? methylPREDNISolone (PF) (SOLU-MEDROL) injection 60 mg  60 mg IntraVENous Q6H   ??? furosemide (LASIX) tablet 40 mg  40 mg Oral BID   ??? albuterol-ipratropium (DUO-NEB) 2.5 MG-0.5 MG/3 ML  3 mL Nebulization Q6H RT   ??? acetylcysteine (MUCOMYST) 200 mg/mL (20 %) solution 600 mg  600 mg Oral BID   ??? enoxaparin (LOVENOX) injection 40 mg  40 mg SubCUTAneous Q12H   ??? levoFLOXacin (LEVAQUIN) tablet 500 mg  500 mg Oral Q24H   ??? metoprolol succinate (TOPROL-XL) XL tablet 50 mg  50 mg Oral DAILY   ??? pravastatin (PRAVACHOL) tablet 20 mg  20 mg Oral QHS   ??? aspirin delayed-release tablet 81 mg  81 mg Oral DAILY   ??? methocarbamol (ROBAXIN) tablet 500 mg  500 mg Oral QID   ??? umeclidinium-vilanterol (ANORO ELLIPTA) 62.5 mcg- 25 mcg/inhalation  1 Puff Inhalation DAILY   ??? amLODIPine (NORVASC) tablet 5 mg  5 mg Oral DAILY       Review of Systems:  A comprehensive review of systems was negative except for: Respiratory: positive for dyspnea on exertion    Objective:  Vital Signs:    Visit Vitals   ??? BP 144/72   ??? Pulse 77   ??? Temp 98.2 ??F (36.8 ??C)   ??? Resp 20   ??? Ht  (1.676 m)   ??? Wt 132.9 kg (292 lb 15.9 oz)   ??? SpO2 97%   ??? BMI 47.29 kg/m2       O2 Device: Hi flow nasal cannula   O2 Flow Rate (L/min): 20 l/min   Temp (24hrs), Avg:98.3 ??F (36.8 ??C), Min:98.2 ??F (36.8 ??C), Max:98.7 ??F (37.1 ??C)     Intake/Output:    Last shift:         Last 3 shifts: 10/13 1901 - 10/15 0700  In: 1160 [P.O.:1160]  Out: 2225 [Urine:2225]    Intake/Output Summary (Last 24 hours) at 03/16/17 0852  Last data filed at 03/16/17 0347   Gross per 24 hour   Intake              830 ml   Output             1625 ml   Net             -795 ml      Physical Exam:   General:  Alert, cooperative, no distress, appears stated age.   Head:  Normocephalic, without obvious abnormality, atraumatic.   Eyes:  Conjunctivae/corneas clear. PERRL, EOMs intact.   Nose: Nares normal. Septum midline. Mucosa normal. No drainage or sinus tenderness.   Throat: Lips, mucosa, and tongue normal. Teeth  and gums normal.   Neck: Supple, symmetrical, trachea midline, no adenopathy, thyroid: no enlargment/tenderness/nodules, no carotid bruit and no JVD.   Back:   Symmetric, no curvature. ROM normal.   Lungs:   Clear to auscultation bilaterally.   Chest wall:  No tenderness or deformity.   Heart:  Regular rate and rhythm, S1, S2 normal, no murmur, click, rub or gallop.   Abdomen:   Soft, non-tender. Bowel sounds normal. No masses,  No organomegaly.   Extremities: Extremities normal, atraumatic, no cyanosis, ++ edema.   Pulses: 2+ and symmetric all extremities.   Skin: Skin color, texture, turgor normal. No rashes or lesions   Lymph nodes: Cervical, supraclavicular, and axillary nodes normal.   Neurologic: Grossly nonfocal     Data review:     Recent Results (from the past 24 hour(s))   GLUCOSE, POC    Collection Time: 03/15/17 11:37 AM   Result Value Ref Range    Glucose (POC) 274 (H) 65 - 100 mg/dL    Performed by Buford Dresser    GLUCOSE, POC    Collection Time: 03/15/17  4:11 PM   Result Value Ref Range    Glucose (POC) 205 (H) 65 - 100 mg/dL    Performed by Algis Greenhouse Nicolette    CBC W/O DIFF    Collection Time: 03/16/17  3:50 AM   Result Value Ref Range    WBC 8.9 3.6 - 11.0 K/uL    RBC 4.31 3.80 - 5.20 M/uL    HGB 11.7 11.5 - 16.0 g/dL    HCT 16.1 09.6 - 04.5 %     MCV 86.1 80.0 - 99.0 FL    MCH 27.1 26.0 - 34.0 PG    MCHC 31.5 30.0 - 36.5 g/dL    RDW 40.9 (H) 81.1 - 14.5 %    PLATELET 226 150 - 400 K/uL    MPV 10.8 8.9 - 12.9 FL    NRBC 0.0 0 PER 100 WBC    ABSOLUTE NRBC 0.00 0.00 - 0.01 K/uL   METABOLIC PANEL, BASIC    Collection Time: 03/16/17  3:50 AM   Result Value Ref Range    Sodium 136 136 - 145 mmol/L    Potassium 4.1 3.5 - 5.1 mmol/L    Chloride 97 97 - 108 mmol/L    CO2 36 (H) 21 - 32 mmol/L    Anion gap 3 (L) 5 - 15 mmol/L    Glucose 190 (H) 65 - 100 mg/dL    BUN 28 (H) 6 - 20 MG/DL    Creatinine 9.14 7.82 - 1.02 MG/DL    BUN/Creatinine ratio 30 (H) 12 - 20      GFR est AA >60 >60 ml/min/1.53m2    GFR est non-AA 59 (L) >60 ml/min/1.74m2    Calcium 8.0 (L) 8.5 - 10.1 MG/DL   GLUCOSE, POC    Collection Time: 03/16/17  7:13 AM   Result Value Ref Range    Glucose (POC) 178 (H) 65 - 100 mg/dL    Performed by Victoriano Lain        Imaging:  I have personally reviewed the patient???s radiographs and have reviewed the reports:

## 2017-03-16 NOTE — Progress Notes (Signed)
Physical Therapy Goals  Initiated 03/12/2017  1. Patient will move from supine to sit and sit to supine in bed with modified independence within 7 day(s).   2. Patient will transfer from bed to chair and chair to bed with modified independence using the least restrictive device within 7 day(s).  3. Patient will perform sit to stand with modified independence within 7 day(s).  4. Patient will ambulate with supervision/set-up for 50 feet with the least restrictive device within 7 day(s).   5. Patient will ascend/descend 3 stairs with 1 handrail(s) with minimal assistance/contact guard assist within 7 day(s).     physical Therapy TREATMENT  Patient: Brittney Shelton [phone] y.o. female)  Date: 03/16/2017  Diagnosis: Acute respiratory failure with hypoxia (HCC) <principal problem not specified>       Precautions: Fall, DNR  Chart, physical therapy assessment, plan of care and goals were reviewed.    ASSESSMENT:  Pt presented sitting in chair, with feet elevated. Pt on high flow NC 20L/min, 93.1% FiO2 throughout session. Pt received knee injection this morning to help alleviate knee pain. Pt able to complete sit<>stand transfers throughout session with SBA, demonstrating improvement from previous sessions. Pt amb within room with RW and CGA (limited due to lines from high flow O2). Pt demonstrating improvement in gait deviations, but still presenting with decreased gait speed, decreased step length, decreased step clearance, and increased trunk sway. Pt able to complete 2 laps of 30 ft within room, with seated rest break between laps. Pt's SpO2 maintaining above 90% during activity. At end of session, pt left working with OT on standing ADLs in front of chair.     Pt is progressing well, but continues to require increased supplemental O2 to keep oxygen saturation adequate with activity. At discharge it is recommended that the pt continue receiving PT at a SNF to return to her PLOF.   Progression toward goals:       Improving appropriately and progressing toward goals      Improving slowly and progressing toward goals      Not making progress toward goals and plan of care will be adjusted     PLAN:  Patient continues to benefit from skilled intervention to address the above impairments.  Continue treatment per established plan of care.  Discharge Recommendations:  Skilled Nursing Facility  Further Equipment Recommendations for Discharge:  None, pt already owns     SUBJECTIVE:   Patient stated ???I want to try and stand up on my own with no help.???    OBJECTIVE DATA SUMMARY:   Critical Behavior:  Neurologic State: Alert  Orientation Level: Oriented X4  Cognition: Follows commands  Safety/Judgement: Awareness of environment, Fall prevention  Functional Mobility Training:  Bed Mobility:                    Transfers:  Sit to Stand: Stand-by assistance  Stand to Sit: Stand-by assistance                             Balance:  Sitting: Intact  Standing: Intact;With support  Standing - Static: Constant support;Fair  Standing - Dynamic : Fair  Ambulation/Gait Training:  Distance (ft): 60 Feet (ft)  Assistive Device: Gait belt;Walker, rolling  Ambulation - Level of Assistance: Contact guard assistance     Gait Description (WDL): Exceptions to WDL  Gait Abnormalities: Decreased step clearance;Trunk sway increased  Base of Support: Widened     Speed/Cadence: Slow;Shuffled  Step Length: Left shortened;Right shortened                               Activity Tolerance:   Pt with good activity tolerance, SpO2 stable with high flow NC above 90% with amb    Please refer to the flowsheet for vital signs taken during this treatment.  After treatment:       Patient left in no apparent distress sitting up in chair      Patient left in no apparent distress in bed      Call bell left within reach      Nursing notified      Caregiver present      Bed alarm activated    COMMUNICATION/COLLABORATION:    The patient???s plan of care was discussed with: Occupational Therapist and Registered Nurse    [name], SPT   Time Calculation: 21 mins        Regarding student involvement in patient care:  A student participated in this treatment session. Per CMS Medicare statements and APTA guidelines I certify that the following was true:  1. I was present and directly observed the entire session.  2. I made all skilled judgments and clinical decisions regarding care.  3. I am the practitioner responsible for assessment, treatment, and documentation.

## 2017-03-16 NOTE — Progress Notes (Signed)
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 There were no regional wall motion abnormalities, was moderate pulmonary hypertension.  Switch Lasix Back to IV as still on high flow O2  Stop IV steroids as pulmonary doesn't believe COPD exacerbation and not improvement on steroids and clinically doesn't seems COPD exacerbation  monitor I/Os, daily weights  Continue Nebs  Complete course of Px Levaquin  Cardiology and pulmonary consult appreciated  Cont home anoro ellipta or formulary equiv    Lumbar degenerative disc disese/back pain and Left knee pain  XR spine, degenerative changes L3-S1, marked disc space narrowing at L4-L5, facet arthropathy L3-S1  PT/OT  Appreciate ortho help with knee injection and they will like to follow patient as an OP. She is not currently candidate for more aggressive ortho intervention due to pulmonary status  ??  DM2 - Diet Controlled  Last A1C on record 11/2016 is 5.8. No meds listed PTA  Current hyperglycemia seems related to Steroids  Continue SSI for now   ??  HTN  Cont toprol and amlodipine   ??  HLD  Cont home pravastatin  ??  Code Status:   Surrogate Decision Maker: Justine Null [phone]  ??  DVT Prophylaxis: sq heparin  GI Prophylaxis: not indicated  ??  Baseline: lives at home with family     Subjective: Pt seen and examined at bedside. Remain on high flow O2. NAD otherwise. Overnight events d/w RN     Chief Complaint / Reason for Physician Visit: f/u "Respiratory Failure"    Review of Systems:  Symptom Y/N Comments  Symptom Y/N Comments    Fever/Chills n   Chest Pain n    Poor Appetite    Edema     Cough n   Abdominal Pain n    Sputum    Joint Pain y Knee and back pain   SOB/DOE y   Pruritis/Rash     Nausea/vomit    Tolerating PT/OT     Diarrhea    Tolerating Diet     Constipation    Other       Could NOT obtain due to:      Objective:     VITALS:   Last 24hrs VS reviewed since prior progress note. Most recent are:  Patient Vitals for the past 24 hrs:   Temp Pulse Resp BP SpO2   03/16/17 0737 98.2 ??F (36.8 ??C) 77 20 144/72 97 %   03/16/17 0347 98.4 ??F (36.9 ??C) 77 20 153/71 98 %   03/16/17 0125 - - - - 95 %   03/15/17 2310 98.2 ??F (36.8 ??C) 76 20 149/73 99 %   03/15/17 1955 98.7 ??F (37.1 ??C) 82 16 155/76 96 %   03/15/17 1925 - - - - 100 %   03/15/17 1612 98.2 ??F (36.8 ??C) 84 18 136/82 96 %   03/15/17 1416 - - - - 94 %   03/15/17 1329 98.3 ??F (36.8 ??C) 86 18 132/67 96 %   03/15/17 1021 98.2 ??F (36.8 ??  C) 84 18 132/78 95 %       Intake/Output Summary (Last 24 hours) at 03/16/17 1011  Last data filed at 03/16/17 0347   Gross per 24 hour   Intake              630 ml   Output             1625 ml   Net             -995 ml        PHYSICAL EXAM:  General: WD, WN. Alert, cooperative, no acute distress????  EENT:  EOMI. Anicteric sclerae. MMM  Resp:  BL basilar crackle, no wheezing or rales.  No accessory muscle use  CV:  Regular  rhythm,?? No edema  GI:  Soft, Non distended, Non tender. ??+Bowel sounds  Neurologic:?? Alert and oriented X 3, normal speech, at baseline  Psych:???? Good insight.??Not anxious nor agitated  Skin:  No rashes.  No jaundice    Reviewed most current lab test results and cultures  YES  Reviewed most current radiology test results   YES  Review and summation of old records today    NO  Reviewed patient's current orders and MAR    YES  PMH/SH reviewed - no change compared to H&P  ________________________________________________________________________  Care Plan discussed with:    Comments   Patient x    Family      RN x    Care Manager      Consultant                        Multidiciplinary team rounds were held today with case manager, nursing, pharmacist and Higher education careers adviser.  Patient's plan of care was discussed; medications were reviewed and discharge planning was addressed.     ________________________________________________________________________  Total NON critical care TIME:  30 Minutes    Total CRITICAL CARE TIME Spent:   Minutes non procedure based      Comments   >50% of visit spent in counseling and coordination of care x Chart review   ________________________________________________________________________  Jamesetta So, MD     Procedures: see electronic medical records for all procedures/Xrays and details which were not copied into this note but were reviewed prior to creation of Plan.      LABS:  I reviewed today's most current labs and imaging studies.  Pertinent labs include:  Recent Labs      03/16/17   0350  03/15/17   0423  03/14/17   0341   WBC  8.9  5.6  5.5   HGB  11.7  12.5  11.1*   HCT  37.1  40.1  36.0   PLT  226  254  242     Recent Labs      03/16/17   0350  03/15/17   0423  03/14/17   0341   NA  136  138  140   K  4.1  4.5  3.8   CL  97  97  100   CO2  36*  36*  36*   GLU  190*  179*  97   BUN  28*  22*  22*   CREA  0.94  0.86  0.78   CA  8.0*  8.1*  7.9*   MG   --   1.9  1.8   PHOS   --   3.3  4.1  Signed: Levonne Spiller, MD

## 2017-03-17 LAB — GLUCOSE, POC
Glucose (POC): 346 mg/dL — ABNORMAL HIGH (ref 65–100)
Glucose (POC): 157 mg/dL — ABNORMAL HIGH (ref 65–100)
Glucose (POC): 256 mg/dL — ABNORMAL HIGH (ref 65–100)
Glucose (POC): 134 mg/dL — ABNORMAL HIGH (ref 65–100)

## 2017-03-17 MED FILL — ACETYLCYSTEINE 20 % (200 MG/ML) SOLN: 200 mg/mL (20 %) | Qty: 4

## 2017-03-17 MED FILL — PRAVASTATIN 10 MG TAB: 10 mg | ORAL | Qty: 2

## 2017-03-17 MED FILL — ASPIRIN 81 MG TAB, DELAYED RELEASE: 81 mg | ORAL | Qty: 1

## 2017-03-17 MED FILL — METHOCARBAMOL 500 MG TAB: 500 mg | ORAL | Qty: 1

## 2017-03-17 MED FILL — LOVENOX 40 MG/0.4 ML SUBCUTANEOUS SYRINGE: 40 mg/0.4 mL | SUBCUTANEOUS | Qty: 0.4

## 2017-03-17 MED FILL — FUROSEMIDE 10 MG/ML IJ SOLN: 10 mg/mL | INTRAMUSCULAR | Qty: 4

## 2017-03-17 MED FILL — IPRATROPIUM-ALBUTEROL 2.5 MG-0.5 MG/3 ML NEB SOLUTION: 2.5 mg-0.5 mg/3 ml | RESPIRATORY_TRACT | Qty: 3

## 2017-03-17 MED FILL — LEVOFLOXACIN 500 MG TAB: 500 mg | ORAL | Qty: 1

## 2017-03-17 MED FILL — AMLODIPINE 5 MG TAB: 5 mg | ORAL | Qty: 1

## 2017-03-17 MED FILL — TOPROL XL 25 MG TABLET,EXTENDED RELEASE: 25 mg | ORAL | Qty: 1

## 2017-03-17 NOTE — Progress Notes (Signed)
Ortho:  Pt states low back and left knee doing well(s/p injection 10/15)  Pt able to mobilize well with PT/OT    Out-pt FU as needed     Leanor RubensteinJonathan J Xylah Early, PA-C

## 2017-03-17 NOTE — Progress Notes (Signed)
[address], [place], [place] [address]  [phone]  Cardiology Progress Note      03/17/2017 1035AM    Admit Date: 03/11/2017    Admit Diagnosis:   Acute respiratory failure with hypoxia (HCC)    Subjective:     Brittney Shelton is a 66 y.o. female with PMH COPD, HF, HTN, DM who was admitted for Acute respiratory failure with hypoxia (HCC).    Overnight events:  -VSS  -no labs  -weight down 2# total; I/o incomplete   -Brittney Shelton states she is feeling well.  No changes from yesterday.       Visit Vitals   ??? BP 133/70 (BP 1 Location: Right arm, BP Patient Position: At rest;Sitting)   ??? Pulse 70   ??? Temp 97.4 ??F (36.3 ??C)   ??? Resp 18   ??? Ht 5\' 6"  (1.676 m)   ??? Wt 130.8 kg (288 lb 4.8 oz)   ??? SpO2 99%   ??? BMI 46.53 kg/m2       Current Facility-Administered Medications   Medication Dose Route Frequency   ??? furosemide (LASIX) injection 40 mg  40 mg IntraVENous BID   ??? metoprolol succinate (TOPROL-XL) XL tablet 75 mg  75 mg Oral DAILY   ??? albuterol-ipratropium (DUO-NEB) 2.5 MG-0.5 MG/3 ML  3 mL Nebulization Q6H RT   ??? acetylcysteine (MUCOMYST) 200 mg/mL (20 %) solution 600 mg  600 mg Oral BID   ??? enoxaparin (LOVENOX) injection 40 mg  40 mg SubCUTAneous Q12H   ??? acetaminophen (TYLENOL) tablet 650 mg  650 mg Oral Q6H PRN   ??? HYDROcodone-acetaminophen (NORCO) 5-325 mg per tablet 1 Tab  1 Tab Oral Q6H PRN   ??? levoFLOXacin (LEVAQUIN) tablet 500 mg  500 mg Oral Q24H   ??? pravastatin (PRAVACHOL) tablet 20 mg  20 mg Oral QHS   ??? aspirin delayed-release tablet 81 mg  81 mg Oral DAILY   ??? methocarbamol (ROBAXIN) tablet 500 mg  500 mg Oral QID   ??? umeclidinium-vilanterol (ANORO ELLIPTA) 62.5 mcg- 25 mcg/inhalation  1 Puff Inhalation DAILY   ??? amLODIPine (NORVASC) tablet 5 mg  5 mg Oral DAILY       Objective:      Physical Exam:  General: pleasant, obese AAF sitting in chair in NAD  Heart: RRR, split s2   Lungs: clear/dim   Abdomen: Soft, +BS, NTND    Extremities: LE bil +DP/PT, ? Lymphedema vs obesity.  Chronic appearance and difficult to determine if any edema.   Neurologic: Grossly normal  Skin:?? Warm and dry.??    Data Review:   Recent Labs      03/16/17   0350  03/15/17   0423   WBC  8.9  5.6   HGB  11.7  12.5   HCT  37.1  40.1   PLT  226  254     Recent Labs      03/16/17   0350  03/15/17   0423   NA  136  138   K  4.1  4.5   CL  97  97   CO2  36*  36*   GLU  190*  179*   BUN  28*  22*   CREA  0.94  0.86   CA  8.0*  8.1*   MG   --   1.9   PHOS   --   3.3       No results for input(s): TROIQ, CPK, CKMB in the  last 72 hours.      Intake/Output Summary (Last 24 hours) at 03/17/17 1314  Last data filed at 03/17/17 1004   Gross per 24 hour   Intake              720 ml   Output             1850 ml   Net            -1130 ml        Telemetry:  SR 1st degree  ECG: read as afib (tele review did not show a fib)    Echocardiogram: EF 60-65%; NWMA; mild TR; mod PHTN   CXRAY: "Study is compromised by patient's body habitus. There is question of a vague patchy density at the right lung base. PA and lateral views are recommended when the patient is stable for further assessment."  CTA chest: "No evidence of acute pulmonary embolus. Unchanged enlargement of the main pulmonary artery, compatible with pulmonary hypertension. Unchanged mediastinal lymphadenopathy. Bilateral indeterminate, ill-defined pulmonary nodules, measuring up to 5 mm; consider follow-up CT in 12 months to assure stability or  resolution. 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 CTA neg for PE.  Troponin neg.  proBNP [id number].  Patient has  felt at her baseline.  COPD history.  ECHO shows pulmonary hypertension  ?? Remains on IV lasix and high flow oxygen   ?? Pulmonary following         HTN:  BP stable - slightly elevated  ?? Multiple home meds still on hold  ?? Continue lower dose of amlodipine and current dose of toprol         [name], NP  DNP, RN, AGACNP-BC

## 2017-03-17 NOTE — Progress Notes (Signed)
PCU SHIFT NURSING NOTE    Bedside and Verbal shift change report given to Smiley, RN & Clarisse Gouge, RN (oncoming nurse) by Marchelle Folks, RN (offgoing nurse). Report included the following information SBAR, Kardex, Intake/Output, MAR, Recent Results and Cardiac Rhythm NSR.    Shift Summary:       Admission Date 03/11/2017   Admission Diagnosis Acute respiratory failure with hypoxia (HCC)   Consults IP CONSULT TO HOSPITALIST  IP CONSULT TO CARDIOLOGY  IP CONSULT TO PALLIATIVE CARE - PROVIDER  IP CONSULT TO INTENSIVIST  IP CONSULT TO ORTHOPEDIC SURGERY        Consults   [] PT   [] OT   [] Speech   [] Case Management      []  Palliative      Cardiac Monitoring Order   [] Yes   [] No     IV drips   [] Yes    Drip:                            Dose:  Drip:                            Dose:  Drip:                            Dose:   [] No     GI Prophylaxis   [] Yes   [] No         DVT Prophylaxis   SCDs:             Ted stockings:         []  Medication   [] Contraindicated   [] None      Activity Level Activity Level: Recliner     Activity Assistance: Partial (two people)   Purposeful Rounding every 1-2 hour?   [] Yes   Noreene Filbert Score  Total Score: 3   Bed Alarm (If score 3 or >)   [] Yes   []  Refused (See signed refusal form in chart)   Braden Score  Braden Score: 17   Braden Score (if score 14 or less)   [] PMT consult   [] Wound Care consult      [] Specialty bed   []  Nutrition consult          Needs prior to discharge:   Home O2 required:    [] Yes   [] No    If yes, how much O2 required?    Other:    Last Bowel Movement: Last Bowel Movement Date: 03/15/17      Influenza Vaccine Received Flu Vaccine for Current Season (usually Sept-March): No    Patient/Guardian Refused (Notify MD): Yes   Pneumonia Vaccine           Diet Active Orders   Diet    DIET CARDIAC Regular      LDAs               Peripheral IV 03/12/17 Left Antecubital (Active)   Site Assessment Clean, dry, & intact 03/17/2017  3:05 AM   Phlebitis Assessment 0 03/17/2017  3:05 AM    Infiltration Assessment 0 03/17/2017  3:05 AM   Dressing Status Clean, dry, & intact 03/17/2017  3:05 AM   Dressing Type Transparent 03/17/2017  3:05 AM   Hub Color/Line Status Pink;Patent;Flushed 03/17/2017  3:05 AM   Action Taken Open ports on tubing capped 03/15/2017  6:00 PM   Alcohol Cap Used  Yes 03/15/2017  6:00 PM          External Female Catheter 03/12/17 (Active)   Site Assessment Clean, dry, & intact 03/17/2017  3:05 AM   Repositioned Yes 03/17/2017  3:05 AM   Perineal Care No 03/17/2017  3:05 AM   Wick Changed Yes 03/17/2017  3:05 AM   Suction Canister/Tubing Changed No 03/17/2017  3:05 AM   Urine Output (mL) 250 ml 03/17/2017  5:36 AM                Urinary Catheter      Intake & Output   Date 03/16/17 0700 - 03/17/17 0659 03/17/17 0700 - 03/18/17 0659   Shift 0700-1859 1900-0659 24 Hour Total 0700-1859 1900-0659 24 Hour Total   I  N  T  A  K  E   P.O.  480 480         P.O.  480 480       Shift Total  (mL/kg)  480  (3.7) 480  (3.7)      O  U  T  P  U  T   Urine  (mL/kg/hr) 1550  (1) 500  (0.3) 2050  (0.7)         Urine Occurrence(s) 2 x  2 x         Urine Output (mL) (External Female Catheter 03/12/17) 1550 500 2050       Shift Total  (mL/kg) 1550  (11.7) 500  (3.8) 2050  (15.7)      NET -1550 -20 -1570      Weight (kg) 132.9 130.8 130.8 130.8 130.8 130.8         Readmission Risk Assessment Tool Score High Risk            36       Total Score        3 Has Seen PCP in Last 6 Months (Yes=3, No=0)    3 Patient Length of Stay (>5 days = 3)    4 IP Visits Last 12 Months (1-3=4, 4=9, >4=11)    9 Pt. Coverage (Medicare=5 , Medicaid, or Self-Pay=4)    17 Charlson Comorbidity Score (Age + Comorbid Conditions)        Criteria that do not apply:    Married. Living with Significant Other. Assisted Living. LTAC. SNF. or   Rehab       Expected Length of Stay 4d 2h   Actual Length of Stay 5

## 2017-03-17 NOTE — Progress Notes (Addendum)
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 There were no regional wall motion abnormalities, was moderate pulmonary hypertension.  Continue Lasix 40mg  iV BID  Stopped IV steroids as pulmonary doesn't believe COPD exacerbation and not improvement on steroids and clinically doesn't seems COPD exacerbation  monitor I/Os, daily weights  Continue Nebs  Complete course of Px Levaquin  Cardiology and pulmonary consult appreciated  Cont home anoro ellipta or formulary equivalent  Remain on high flo O2    Lumbar degenerative disc disese/back pain and Left knee pain  XR spine, degenerative changes L3-S1, marked disc space narrowing at L4-L5, facet arthropathy L3-S1  PT/OT  Appreciate ortho help with knee injection and they will like to follow patient as an OP. She is not currently candidate for more aggressive ortho intervention due to pulmonary status  ??  DM2 - Diet Controlled  Last A1C on record 11/2016 is 5.8. No meds listed PTA  Current hyperglycemia seems related to Steroids  Continue SSI for now   ??  HTN  Cont toprol and amlodipine   ??  HLD  Cont home pravastatin  ??  Code Status:   Surrogate Decision Maker: Justine Null [phone]  ??  DVT Prophylaxis: sq heparin  GI Prophylaxis: not indicated  ??  Baseline: lives at home with family     Subjective: Pt seen and examined at bedside. Remain on high flow O2. Now new complaints otherwise. Overnight events d/w RN     Chief Complaint / Reason for Physician Visit: f/u "Respiratory Failure"    Review of Systems:   Symptom Y/N Comments  Symptom Y/N Comments   Fever/Chills n   Chest Pain n    Poor Appetite    Edema     Cough n   Abdominal Pain n    Sputum    Joint Pain y Knee and back pain   SOB/DOE y   Pruritis/Rash     Nausea/vomit    Tolerating PT/OT     Diarrhea    Tolerating Diet     Constipation    Other       Could NOT obtain due to:      Objective:     VITALS:   Last 24hrs VS reviewed since prior progress note. Most recent are:  Patient Vitals for the past 24 hrs:   Temp Pulse Resp BP SpO2   03/17/17 0716 97.8 ??F (36.6 ??C) 79 18 152/80 97 %   03/17/17 0305 97.9 ??F (36.6 ??C) 75 16 159/69 99 %   03/17/17 0304 - - - - 97 %   03/17/17 0108 - - - - (!) 84 %   03/16/17 2317 97.9 ??F (36.6 ??C) 75 18 138/58 100 %   03/16/17 2006 - - - - 97 %   03/16/17 1921 97.8 ??F (36.6 ??C) 76 18 142/60 97 %   03/16/17 1706 98 ??F (36.7 ??C) 68 18 150/78 96 %   03/16/17 1105 97.3 ??F (36.3 ??C) 83 16 136/67 97 %  Intake/Output Summary (Last 24 hours) at 03/17/17 0851  Last data filed at 03/17/17 0536   Gross per 24 hour   Intake              480 ml   Output             1850 ml   Net            -1370 ml        PHYSICAL EXAM:  General: WD, WN. Alert, cooperative, no acute distress????  EENT:  EOMI. Anicteric sclerae. MMM  Resp:  BL basilar crackle, no wheezing or rales.  No accessory muscle use  CV:  Regular  rhythm,?? No edema  GI:  Soft, Non distended, Non tender. ??+Bowel sounds  Neurologic:?? Alert and oriented X 3, normal speech, at baseline  Psych:???? Good insight.??Not anxious nor agitated  Skin:  No rashes.  No jaundice    Reviewed most current lab test results and cultures  YES  Reviewed most current radiology test results   YES  Review and summation of old records today    NO  Reviewed patient's current orders and MAR    YES  PMH/SH reviewed - no change compared to H&P  ________________________________________________________________________  Care Plan discussed with:    Comments   Patient x    Family      RN x    Care Manager      Consultant                        Multidiciplinary team rounds were held today with case manager, nursing, pharmacist and Higher education careers adviserclinical coordinator.  Patient's plan of care was discussed; medications were reviewed and discharge planning was addressed.     ________________________________________________________________________  Total NON critical care TIME: 25 Minutes    Total CRITICAL CARE TIME Spent:   Minutes non procedure based      Comments   >50% of visit spent in counseling and coordination of care     ________________________________________________________________________  Jamesetta SoMushtaq Davey Bergsma, MD     Procedures: see electronic medical records for all procedures/Xrays and details which were not copied into this note but were reviewed prior to creation of Plan.      LABS:  I reviewed today's most current labs and imaging studies.  Pertinent labs include:  Recent Labs      03/16/17   0350  03/15/17   0423   WBC  8.9  5.6   HGB  11.7  12.5   HCT  37.1  40.1   PLT  226  254     Recent Labs      03/16/17   0350  03/15/17   0423   NA  136  138   K  4.1  4.5   CL  97  97   CO2  36*  36*   GLU  190*  179*   BUN  28*  22*   CREA  0.94  0.86   CA  8.0*  8.1*   MG   --   1.9   PHOS   --   3.3       Signed: Jamesetta SoMushtaq Yoseph Haile, MD

## 2017-03-17 NOTE — Progress Notes (Signed)
Pt has had an uneventful day.  Pt is currently sitting in the recliner watching television.

## 2017-03-17 NOTE — Progress Notes (Addendum)
Reason for Admission:   Pt was admitted on 03/12/17 d/t a Dx: Chronic Hypoxic Respiratory Failure.  CHF.  Likely obesity hypoventilation.  Lumbar degenerative disc disease/back pain.  COPD.  DM2.  HTN.  HLD.               RRAT Score:     36             Resources/supports as identified by patient/family:   Pt has Medicare and Medicaid.                  Top Challenges facing patient (as identified by patient/family and CM):                       Finances/Medication cost?      Pt stated no problems financially ro assessing medications.  Pharmacy: [facility]: [address]              Transportation?  Pt does not drive Pt stated her sister in law and brother transport her as needed.                 Support system or lack thereof? Main Supports are Brother and Sister in law                      Living arrangements?     Pt lives with brother and sister in law in one story home with 3 STE.             Self-care/ADLs/Cognition?  Alert and oriented.  Pt stated she was I W ADLs prior to hospitalization/rehab          Current Advanced Directive/Advance Care Plan:  DNR.  AMD on file lists: Barbie Haggis as MPOA: [phone] and Rinaldo Cloud: [phone].                            Plan for utilizing home health:    Pt was discharged from SNF at [facility] in October.  Pt stated that she was not home long BS HH was supposed to come out to open her and she was admitted to the hospital before they could actually come out to see Pt.  If HH is needed we can do a Resumption of Care at discharge from hospital or SNF                      Likelihood of readmission:   HIGH                 Transition of Care Plan:              Pt is currently on high flow oxygen on PCU.  Therapy is recommending SNF vs Home Health pending progress and decreased oxygen needs.  CM talked to Pt about therapy recommendations.  Pt is aware that she would need to be on regular oxygen to discharge.  Pt  is in agreement to go back to SNF for rehab at discharge to get stronger before going home.   Pt stated that she has used about 2 months at [facility].  Pt is open to going back to rehab once stable but does not want to go back to [place] gave her a choice list and the [email or web address] [facility] Compare Website to review stars for local rehabs in the area.   Pt will talk  with family to decide what facility to send referrals to see what beds are available.   CM needs to check with her tomorrow if she does not call and leave a VM for you tomorrow.      CM will continue to monitor discharge plan.     Care Management Interventions  PCP Verified by CM: Yes  Palliative Care Criteria Met (RRAT>21 & CHF Dx)?: No  Mode of Transport at Discharge:  (TBD)  Transition of Care Consult (CM Consult): Discharge Planning  MyChart Signup: No  Discharge Durable Medical Equipment: No  Physical Therapy Consult: Yes  Occupational Therapy Consult: Yes  Speech Therapy Consult: No  Current Support Network: Lives Alone, Own Home  Discharge Location  Discharge Placement: Unable to determine at this time    [place]

## 2017-03-17 NOTE — Progress Notes (Signed)
ADULT PROTOCOL: JET AEROSOL  REASSESSMENT    Patient  Brittney Shelton     66 y.o.   female     03/17/2017  9:21 PM    Breath Sounds Pre Procedure: Right Breath Sounds: Diminished                               Left Breath Sounds: Diminished    Breath Sounds Post Procedure: Right Breath Sounds: Diminished                                 Left Breath Sounds: Diminished    Breathing pattern: Pre procedure Breathing Pattern: Regular          Post procedure Breathing Pattern: Regular    Heart Rate: Pre procedure Pulse: 65           Post procedure Pulse: 69    Resp Rate: Pre procedure Respirations: 20           Post procedure Respirations: 20    Incentive Spirometry:  Actual Volume (ml): 550 ml          Cough: Pre procedure Cough: Non-productive               Post procedure Cough: Non-productive      Oxygen: O2 Device: Hi flow nasal cannula   Flow rate (L/min) 20 and FiO2 (%) 93     Changed: NO    SpO2: Pre procedure SpO2: 94 %   with oxygen              Post procedure SpO2: 93 %  with oxygen    Nebulizer Therapy: Current medications Aerosolized Medications: DuoNeb      Changed: NO      Problem List:   Patient Active Problem List   Diagnosis Code   ??? Well controlled type 2 diabetes mellitus (HCC) E11.9   ??? Hypercholesterolemia E78.00   ??? 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 COPD (chronic obstructive pulmonary disease) (HCC) J44.9   ??? Obesity E66.9   ??? Requires continuous at home supplemental oxygen Z99.81   ??? Dependence on supplemental oxygen Z99.81   ??? ACP (advance care planning) Z71.89   ??? Counseling regarding advanced care planning and goals of care Z71.89   ??? Dyspnea R06.00   ??? Acute on chronic diastolic (congestive) heart failure (HCC) I50.33   ??? Obesity, morbid (HCC) E66.01   ??? 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 Intractable back pain M54.9   ??? Ambulatory dysfunction R26.2   ??? 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 Angioedema T78.3XXA   ??? Acute respiratory failure with hypoxia (HCC) J96.01       Respiratory Therapist: Piedad Climesavid W [address], RT

## 2017-03-17 NOTE — Progress Notes (Signed)
[facility], [facility]    Initial Patient Consult    Name: Brittney LimesMary S Shelton MRN: [id number]   DOB: 11/12/1950 [facility]   Date: 03/17/2017        IMPRESSION:   ?? Acute respiratory failure  ?? OHS  ?? OSA  ?? Pulmonary HTN  ?? Pulmonary nodules      RECOMMENDATIONS:   ?? Wean O2  ?? Jet nebs  ?? Diurese   ?? Will need out pt pft's  ?? Will need repeat chest ct in 1 year  ?? Mobilize      Subjective:     No acute events overnight  No acute distress  No acute complaints         Current Facility-Administered Medications   Medication Dose Route Frequency   ??? furosemide (LASIX) injection 40 mg  40 mg IntraVENous BID   ??? metoprolol succinate (TOPROL-XL) XL tablet 75 mg  75 mg Oral DAILY   ??? albuterol-ipratropium (DUO-NEB) 2.5 MG-0.5 MG/3 ML  3 mL Nebulization Q6H RT   ??? acetylcysteine (MUCOMYST) 200 mg/mL (20 %) solution 600 mg  600 mg Oral BID   ??? enoxaparin (LOVENOX) injection 40 mg  40 mg SubCUTAneous Q12H   ??? levoFLOXacin (LEVAQUIN) tablet 500 mg  500 mg Oral Q24H   ??? pravastatin (PRAVACHOL) tablet 20 mg  20 mg Oral QHS   ??? aspirin delayed-release tablet 81 mg  81 mg Oral DAILY   ??? methocarbamol (ROBAXIN) tablet 500 mg  500 mg Oral QID   ??? umeclidinium-vilanterol (ANORO ELLIPTA) 62.5 mcg- 25 mcg/inhalation  1 Puff Inhalation DAILY   ??? amLODIPine (NORVASC) tablet 5 mg  5 mg Oral DAILY       Review of Systems:  A comprehensive review of systems was negative except for: Respiratory: positive for dyspnea on exertion    Objective:  Vital Signs:    Visit Vitals   ??? BP 152/80   ??? Pulse 79   ??? Temp 97.8 ??F (36.6 ??C)   ??? Resp 18   ??? Ht 5\' 6"  (1.676 m)   ??? Wt 130.8 kg (288 lb 4.8 oz)   ??? SpO2 97%   ??? BMI 46.53 kg/m2       O2 Device: Hi flow nasal cannula   O2 Flow Rate (L/min): 20 l/min   Temp (24hrs), Avg:97.8 ??F (36.6 ??C), Min:97.3 ??F (36.3 ??C), Max:98 ??F (36.7 ??C)     Intake/Output:   Last shift:         Last 3 shifts: 10/14 1901 - 10/16 0700   In: 480 [P.O.:480]  Out: 2650 [Urine:2650]    Intake/Output Summary (Last 24 hours) at 03/17/17 0845  Last data filed at 03/17/17 0536   Gross per 24 hour   Intake              480 ml   Output             1850 ml   Net            -1370 ml      Physical Exam:   General:  Alert, cooperative, no distress, appears stated age.   Head:  Normocephalic, without obvious abnormality, atraumatic.   Eyes:  Conjunctivae/corneas clear. PERRL, EOMs intact.   Nose: Nares normal. Septum midline. Mucosa normal. No drainage or sinus tenderness.   Throat: Lips, mucosa, and tongue normal. Teeth and gums normal.   Neck: Supple, symmetrical, trachea midline, no  adenopathy, thyroid: no enlargment/tenderness/nodules, no carotid bruit and no JVD.   Back:   Symmetric, no curvature. ROM normal.   Lungs:   Clear to auscultation bilaterally.   Chest wall:  No tenderness or deformity.   Heart:  Regular rate and rhythm, S1, S2 normal, no murmur, click, rub or gallop.   Abdomen:   Soft, non-tender. Bowel sounds normal. No masses,  No organomegaly.   Extremities: Extremities normal, atraumatic, no cyanosis, ++ edema.   Pulses: 2+ and symmetric all extremities.   Skin: Skin color, texture, turgor normal. No rashes or lesions   Lymph nodes: Cervical, supraclavicular, and axillary nodes normal.   Neurologic: Grossly nonfocal     Data review:     Recent Results (from the past 24 hour(s))   GLUCOSE, POC    Collection Time: 03/16/17 11:10 AM   Result Value Ref Range    Glucose (POC) 331 (H) 65 - 100 mg/dL    Performed by Victoriano Lain    GLUCOSE, POC    Collection Time: 03/16/17  4:09 PM   Result Value Ref Range    Glucose (POC) 229 (H) 65 - 100 mg/dL    Performed by Victoriano Lain    GLUCOSE, POC    Collection Time: 03/16/17  9:08 PM   Result Value Ref Range    Glucose (POC) 346 (H) 65 - 100 mg/dL    Performed by Bertram Denver    GLUCOSE, POC    Collection Time: 03/17/17  7:19 AM   Result Value Ref Range    Glucose (POC) 157 (H) 65 - 100 mg/dL     Performed by ELI MONIQUE        Imaging:  I have personally reviewed the patient???s radiographs and have reviewed the reports:

## 2017-03-17 NOTE — Consults (Signed)
Palliative Medicine Consult  [place]: [phone])  Patient Name: Brittney Shelton  Date of Birth: Sep 02, 1950    Date of Initial Consult: 03/12/17  Reason for Consult: care decisions/ bundle patient   Requesting Provider: Trula Slade MD  Primary Care Physician: Ermalene Postin, MD     SUMMARY:   Brittney Shelton is a 66 y.o. 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  She  was admitted on 03/11/2017 from home  with a diagnosis of hypoxia despite on 4 litre of oxygen and and back pain .     Current medical issues leading to Palliative Medicine involvement include:  Diastolic CHF  and pulmonary hyperetension , COPD,  Currently full code , needs  Health care instruction/advance directives ,  has completed MPOA, documents,  HF bundle patient , care decisions.    Lives with his brother in one story house ,  got out of rehab yesterday, was able to  Walk with walker and needed assistance with Adls , single no children .       PALLIATIVE DIAGNOSES:   1. Advance directives   2. Chronic back pain /spasm, without sciatica  3. Sob /acute exacerbation of CHF/pul hypetrtension   4.  Morbid Obesity        PLAN:   1. Met with the patient .  2. Today patient notes pain is better, she denies any complaints .  3. Advance directives : patient has previously completed the Advance directives /MPOA portion , scanned in system .We followed up on completing the living will portion of advance directives,  Patient had discussed with her brother over the weekend , does not want to complete living will.  She does not want to make any changes in MPOA, Bronwen Betters as primary MPOA  [phone]) and secondary agents are her brothers Jaquelyn Bitter and Harlon Ditty [phone]).  4. Resuscitation orders /DDNR  : she confirms in patient DNR order not to attempt CPR if her heart stops .       In addition  No intubation for resp distress out side end of life , YES for CPAP and Bipap, YES for vasopressors .   follow up on DDNR today  she does not want to complete one .    6. initial consult note routed to primary continuity provider  7. Communicated plan of care with: Palliative IDT       GOALS OF CARE / TREATMENT PREFERENCES:     GOALS OF CARE:  Patient/Health Care Proxy Stated Goals: Rehabilitation      TREATMENT PREFERENCES:   Code Status: DNR    Advance Care Planning:  Advance Care Planning 03/13/2017   Patient's Healthcare Decision Maker is: Named in scanned ACP document   Primary Decision Maker Name Bronwen Betters   Primary Decision Maker Phone Number [phone]   Primary Decision Maker Relationship to Patient Other relative   Secondary Decision Maker Name Salome Arnt and Harlon Ditty    Secondary Decision Maker Phone Number [phone]   Secondary Decision Maker Relationship to Patient -   Confirm Advance Directive Yes, on file   Patient Would Like to Complete  Advance Directive -       Medical Interventions: Limited additional interventions   Other Instructions:         Other:    As far as possible, the palliative care team has discussed with patient / health care proxy about goals of care / treatment preferences for patient.           HISTORY:     History obtained from: chart and patient .    CHIEF COMPLAINT: sob and back pain .  HPI/SUBJECTIVE:    The patient is:   '[]'  Verbal and participatory  Patient reports doing well at reahb and progressed in function to walk with walker  , she is satisfied with [facility] facility , at 5: 30 am today she got up to go to bathroom , felt excruciating pain in back and sob , came to ER.      today her back spasms are better , atleast she is able to twist and turn in bed , aleve helps with back pain along with Robaxin , some days are good without back pain , however she always have some  pain " left knee pain " due to severe arthritis , may need joint replacement soon .   She uses biofreeze cream for the knee, helps with pain .    Currently she reports back pain /spasm 8/10, currently on high flow oxygen .    She denies any nausea , vomiting or constipation .    03/13/17 : patient is currently in recliner , notes she is over all feeling " better " back pain is better "    Denies any nausea , vomiting , cough , fever , bowels are moving .    03/17/17 ; patient denies any complaints , left knee pain is better.  Back pain is tolerable " I have learned to live with it ".    Denies any nausea , cough , bowels moved day before yesterday .      Clinical Pain Assessment (nonverbal scale for severity on nonverbal patients):   Clinical Pain Assessment  Severity: 4  Location: lower back  Character: spasm   Duration: years   Effect: function   Factors: none   Frequency: comes and go           Duration: for how long has pt been experiencing pain (e.g., 2 days, 1 month, years)  Frequency: how often pain is an issue (e.g., several times per day, once every few days, constant)     FUNCTIONAL ASSESSMENT:     Palliative Performance Scale (PPS):  PPS: 50       PSYCHOSOCIAL/SPIRITUAL SCREENING:     Palliative IDT has assessed this patient for cultural preferences / practices and a referral made as appropriate to needs (Cultural Services, Patient Advocacy, Ethics, etc.)    Advance Care Planning:  Advance Care Planning 03/13/2017   Patient's Healthcare Decision Maker is: Named in scanned ACP document   Primary Decision Maker Name Bronwen Betters   Primary Decision Maker Phone Number [phone]   Primary Decision Maker Relationship to Patient Other relative   Secondary Decision Maker Name Salome Arnt and [place] Phone Number [phone]   Secondary Decision Maker Relationship to Patient -   Confirm Advance Directive Yes, on file   Patient Would Like to Complete Advance Directive -       Any spiritual / religious concerns:  '[]'  Yes /  '[]'  No  Caregiver Burnout:  '[]'  Yes /  '[]'  No /  '[]'  No Caregiver Present      Anticipatory grief assessment:   '[]'  Normal  / '[]'  Maladaptive       ESAS Anxiety: Anxiety: 2    ESAS Depression: Depression: 0        REVIEW OF SYSTEMS:     Positive and pertinent negative findings in ROS are noted above in HPI.  The following systems were '[x]'  reviewed / '[]'  unable to be reviewed as noted in HPI  Other findings are noted below.  Systems: constitutional, ears/nose/mouth/throat, respiratory, gastrointestinal, genitourinary, musculoskeletal, integumentary, neurologic, psychiatric, endocrine. Positive findings noted below.  Modified ESAS Completed by: provider   Fatigue: 5 Drowsiness: 0   Depression: 0 Pain: 4   Anxiety: 2 Nausea: 0   Anorexia: 2 Dyspnea: 5     Constipation: No     Stool Occurrence(s): 1        PHYSICAL EXAM:     From RN flowsheet:  Wt Readings from Last 3 Encounters:   03/17/17 288 lb 4.8 oz (130.8 kg)   01/06/17 298 lb (135.2 kg)   12/17/16 315 lb (142.9 kg)     Blood pressure 133/70, pulse 70, temperature 97.4 ??F (36.3 ??C), resp. rate 18, height '5\' 6"'  (1.676 m), weight 288 lb 4.8 oz (130.8 kg), SpO2 99 %.    Pain Scale 1: Numeric (0 - 10)  Pain Intensity 1: 0  Pain Onset 1: chronic  Pain Location 1: Back  Pain Orientation 1: Mid  Pain Description 1: Aching  Pain Intervention(s) 1: Relaxation technique, Repositioned, Rest  Last bowel movement, if known:     Constitutional: morbid  obese , alert oriented on high flow , siting in recliner, not in any distress, on high flow oxygen .  Eyes: pupils equal, anicteric  ENMT: no nasal discharge, moist mucous membranes  Cardiovascular: regular rhythm.  Respiratory: breathing not labored, symmetric   Gastrointestinal: soft non-tender, +bowel sounds  Musculoskeletal: no deformity, no tenderness to palpation, bilateral edema of legs / morbid obese.  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 Chronic obstructive pulmonary disease ([place])    ??? Congestive heart failure ([place])    ??? Diabetes ([place])    ??? Hypertension       History reviewed. No pertinent surgical history.   Family History   Problem Relation Age of Onset   ??? Diabetes Mother    ??? Arthritis-osteo Father    ??? COPD Sister    ??? Diabetes Brother    ??? Diabetes Brother       History reviewed, no pertinent family history.  Social History   Substance Use Topics   ??? Smoking status: Never Smoker   ??? Smokeless tobacco: Never Used   ??? Alcohol use No     Allergies   Allergen Reactions   ??? Lisinopril Angioedema      Current Facility-Administered Medications   Medication Dose Route Frequency   ??? furosemide (LASIX) injection 40 mg  40 mg IntraVENous BID   ???  metoprolol succinate (TOPROL-XL) XL tablet 75 mg  75 mg Oral DAILY   ??? albuterol-ipratropium (DUO-NEB) 2.5 MG-0.5 MG/3 ML  3 mL Nebulization Q6H RT   ??? acetylcysteine (MUCOMYST) 200 mg/mL (20 %) solution 600 mg  600 mg Oral BID   ??? enoxaparin (LOVENOX) injection 40 mg  40 mg SubCUTAneous Q12H   ??? acetaminophen (TYLENOL) tablet 650 mg  650 mg Oral Q6H PRN   ??? HYDROcodone-acetaminophen (NORCO) 5-325 mg per tablet 1 Tab  1 Tab Oral Q6H PRN   ??? levoFLOXacin (LEVAQUIN) tablet 500 mg  500 mg Oral Q24H   ??? pravastatin (PRAVACHOL) tablet 20 mg  20 mg Oral QHS   ??? aspirin delayed-release tablet 81 mg  81 mg Oral DAILY    ??? methocarbamol (ROBAXIN) tablet 500 mg  500 mg Oral QID   ??? umeclidinium-vilanterol (ANORO ELLIPTA) 62.5 mcg- 25 mcg/inhalation  1 Puff Inhalation DAILY   ??? amLODIPine (NORVASC) tablet 5 mg  5 mg Oral DAILY          LAB AND IMAGING FINDINGS:     Lab Results   Component Value Date/Time    WBC 8.9 03/16/2017 03:50 AM    HGB 11.7 03/16/2017 03:50 AM    PLATELET 226 03/16/2017 03:50 AM     Lab Results   Component Value Date/Time    Sodium 136 03/16/2017 03:50 AM    Potassium 4.1 03/16/2017 03:50 AM    Chloride 97 03/16/2017 03:50 AM    CO2 36 (H) 03/16/2017 03:50 AM    BUN 28 (H) 03/16/2017 03:50 AM    Creatinine 0.94 03/16/2017 03:50 AM    Calcium 8.0 (L) 03/16/2017 03:50 AM    Magnesium 1.9 03/15/2017 04:23 AM    Phosphorus 3.3 03/15/2017 04:23 AM      Lab Results   Component Value Date/Time    AST (SGOT) 9 (L) 03/13/2017 03:44 AM    Alk. phosphatase 45 03/13/2017 03:44 AM    Protein, total 6.8 03/13/2017 03:44 AM    Albumin 2.7 (L) 03/13/2017 03:44 AM    Globulin 4.1 (H) 03/13/2017 03:44 AM     Lab Results   Component Value Date/Time    INR 1.1 03/11/2017 08:00 PM    Prothrombin time 11.1 03/11/2017 08:00 PM    aPTT 29.4 03/11/2017 08:00 PM      No results found for: IRON, FE, TIBC, IBCT, PSAT, FERR   Lab Results   Component Value Date/Time    pH 7.48 (H) 03/11/2017 08:30 PM    PCO2 46 (H) 03/11/2017 08:30 PM    PO2 46 (LL) 03/11/2017 08:30 PM     No components found for: [facility]   Lab Results   Component Value Date/Time    CK 129 03/11/2017 08:00 PM    CK - MB <1.0 03/11/2017 08:00 PM                Total time:   Counseling / coordination time, spent as noted above:   > 50% counseling / coordination?:     Prolonged service was provided for  '[]' 30 min   '[]' 75 min in face to face time in the presence of the patient, spent as noted above.  Time Start:   Time End:   Note: this can only be billed with [phone] (initial) or [phone] (follow up).  If multiple start / stop times, list each separately.

## 2017-03-17 NOTE — Progress Notes (Signed)
Attempted to see pt for OT services and pt was eating lunch.  Will defer and continue to follow.

## 2017-03-17 NOTE — Progress Notes (Addendum)
PCU SHIFT NURSING NOTE    Bedside shift change report given to Achille Rich RN (oncoming nurse) by Reuel Boom and Efraim Kaufmann RN (offgoing nurse). Report included the following information SBAR, Kardex, Intake/Output, MAR, Recent Results and Cardiac Rhythm NSR.    Shift Summary:   1910: Pt in recliner watching TV, no distress, no c/o pain. Alarm active, call bell within reach.  2015: Assisted pt c walker to bed. Dyspnea on exertion, recovers quickly. Purewick changed.  0100-0500: The documentation for this period is being entered following the guidelines as defined in the [facility] downtime policy by Bertram Denver, RN.  0700: Bedside shift change report given to Feliz Beam RN and Efraim Kaufmann RN (oncoming nurse) by Achille Rich RN (offgoing nurse). Report included the following information SBAR, Intake/Output, MAR, Recent Results and Cardiac Rhythm NSR.             Admission Date 03/11/2017   Admission Diagnosis Acute respiratory failure with hypoxia (HCC)   Consults IP CONSULT TO HOSPITALIST  IP CONSULT TO CARDIOLOGY  IP CONSULT TO PALLIATIVE CARE - PROVIDER  IP CONSULT TO INTENSIVIST  IP CONSULT TO ORTHOPEDIC SURGERY        Consults   PT   OT   Speech   Case Management       Palliative      Cardiac Monitoring Order   Yes   No     IV drips   Yes    Drip:                            Dose:  Drip:                            Dose:  Drip:                            Dose:   No     GI Prophylaxis   Yes   No         DVT Prophylaxis   SCDs:             Ted stockings:          Medication   Contraindicated   None      Activity Level Activity Level: Recliner     Activity Assistance: Partial (two people)   Purposeful Rounding every 1-2 hour?   Yes   Schmidt Score  Total Score: 3   Bed Alarm (If score 3 or >)   Yes    Refused (See signed refusal form in chart)   Braden Score  Braden Score: 17   Braden Score (if score 14 or less)   PMT consult   Wound Care consult      Specialty bed    Nutrition consult           Needs prior to discharge:   Home O2 required:    Yes   No    If yes, how much O2 required?    Other:    Last Bowel Movement: Last Bowel Movement Date: 03/15/17      Influenza Vaccine Received Flu Vaccine for Current Season (usually Sept-March): No    Patient/Guardian Refused (Notify MD): Yes   Pneumonia Vaccine           Diet Active Orders   Diet    DIET CARDIAC Regular      LDAs  Peripheral IV 03/12/17 Left Antecubital (Active)   Site Assessment Clean, dry, & intact 03/17/2017  2:45 PM   Phlebitis Assessment 0 03/17/2017  2:45 PM   Infiltration Assessment 0 03/17/2017  2:45 PM   Dressing Status Clean, dry, & intact 03/17/2017  2:45 PM   Dressing Type Tape;Transparent 03/17/2017  2:45 PM   Hub Color/Line Status Pink 03/17/2017  2:45 PM   Action Taken Open ports on tubing capped 03/15/2017  6:00 PM   Alcohol Cap Used Yes 03/15/2017  6:00 PM          External Female Catheter 03/12/17 (Active)   Site Assessment Clean, dry, & intact 03/17/2017  6:59 PM   Repositioned Yes 03/17/2017  2:45 PM   Perineal Care No 03/17/2017  2:45 PM   Wick Changed No 03/17/2017  2:45 PM   Suction Canister/Tubing Changed Yes 03/17/2017  6:59 PM   Urine Output (mL) 450 ml 03/17/2017  6:59 PM                Urinary Catheter      Intake & Output   Date 03/16/17 1900 - 03/17/17 0659 03/17/17 0700 - 03/18/17 0659   Shift 1900-0659 24 Hour Total 0700-1859 1900-0659 24 Hour Total   I  N  T  A  K  E   P.O. 480 480 720  720      P.O. 480 480 720  720    Shift Total  (mL/kg) 480  (3.7) 480  (3.7) 720  (5.5)  720  (5.5)   O  U  T  P  U  T   Urine  (mL/kg/hr) 500 2050 750  (0.5)  750      Urine Occurrence(s)  2 x         Urine Output (mL) (External Female Catheter 03/12/17) 500 2050 750  750    Shift Total  (mL/kg) 500  (3.8) 2050  (15.7) 750  (5.7)  750  (5.7)   NET -20 -1570 -30  -30   Weight (kg) 130.8 130.8 130.8 130.8 130.8         Readmission Risk Assessment Tool Score High Risk            36       Total Score         3 Has Seen PCP in Last 6 Months (Yes=3, No=0)    3 Patient Length of Stay (>5 days = 3)    4 IP Visits Last 12 Months (1-3=4, 4=9, >4=11)    9 Pt. Coverage (Medicare=5 , Medicaid, or Self-Pay=4)    17 Charlson Comorbidity Score (Age + Comorbid Conditions)        Criteria that do not apply:    Married. Living with Significant Other. Assisted Living. LTAC. SNF. or   Rehab       Expected Length of Stay 4d 2h   Actual Length of Stay 5

## 2017-03-18 ENCOUNTER — Encounter: Attending: Cardiovascular Disease | Primary: Internal Medicine

## 2017-03-18 LAB — GLUCOSE, POC
Glucose (POC): 166 mg/dL — ABNORMAL HIGH (ref 65–100)
Glucose (POC): 105 mg/dL — ABNORMAL HIGH (ref 65–100)
Glucose (POC): 219 mg/dL — ABNORMAL HIGH (ref 65–100)

## 2017-03-18 LAB — METABOLIC PANEL, BASIC
Anion gap: 4 mmol/L — ABNORMAL LOW (ref 5–15)
BUN/Creatinine ratio: 37 — ABNORMAL HIGH (ref 12–20)
BUN: 33 MG/DL — ABNORMAL HIGH (ref 6–20)
CO2: 38 mmol/L — ABNORMAL HIGH (ref 21–32)
Calcium: 7.5 MG/DL — ABNORMAL LOW (ref 8.5–10.1)
Chloride: 96 mmol/L — ABNORMAL LOW (ref 97–108)
Creatinine: 0.9 MG/DL (ref 0.55–1.02)
GFR est AA: >60 mL/min/{1.73_m2} (ref >60)
GFR est non-AA: >60 mL/min/{1.73_m2} (ref >60)
Glucose: 113 mg/dL — ABNORMAL HIGH (ref 65–100)
Potassium: 3.4 mmol/L — ABNORMAL LOW (ref 3.5–5.1)
Sodium: 138 mmol/L (ref 136–145)

## 2017-03-18 MED ORDER — IPRATROPIUM-ALBUTEROL 2.5 MG-0.5 MG/3 ML NEB SOLUTION
2.5 mg-0.5 mg/3 ml | Freq: Four times a day (QID) | RESPIRATORY_TRACT | Status: DC | PRN
Start: 2017-03-18 — End: 2017-04-04

## 2017-03-18 MED ORDER — POTASSIUM CHLORIDE SR 20 MEQ TAB, PARTICLES/CRYSTALS
20 mEq | Freq: Two times a day (BID) | ORAL | Status: AC
Start: 2017-03-18 — End: 2017-03-19
  Administered 2017-03-18 – 2017-03-19 (×2): via ORAL

## 2017-03-18 MED ORDER — POTASSIUM CHLORIDE SR 10 MEQ TAB
10 mEq | ORAL | Status: AC
Start: 2017-03-18 — End: 2017-03-18
  Administered 2017-03-18: 18:00:00 via ORAL

## 2017-03-18 MED FILL — ACETYLCYSTEINE 20 % (200 MG/ML) SOLN: 200 mg/mL (20 %) | Qty: 4

## 2017-03-18 MED FILL — LOVENOX 40 MG/0.4 ML SUBCUTANEOUS SYRINGE: 40 mg/0.4 mL | SUBCUTANEOUS | Qty: 0.4

## 2017-03-18 MED FILL — METHOCARBAMOL 500 MG TAB: 500 mg | ORAL | Qty: 1

## 2017-03-18 MED FILL — IPRATROPIUM-ALBUTEROL 2.5 MG-0.5 MG/3 ML NEB SOLUTION: 2.5 mg-0.5 mg/3 ml | RESPIRATORY_TRACT | Qty: 3

## 2017-03-18 MED FILL — HYDROCODONE-ACETAMINOPHEN 5 MG-325 MG TAB: 5-325 mg | ORAL | Qty: 1

## 2017-03-18 MED FILL — FUROSEMIDE 10 MG/ML IJ SOLN: 10 mg/mL | INTRAMUSCULAR | Qty: 4

## 2017-03-18 MED FILL — TOPROL XL 25 MG TABLET,EXTENDED RELEASE: 25 mg | ORAL | Qty: 1

## 2017-03-18 MED FILL — AMLODIPINE 5 MG TAB: 5 mg | ORAL | Qty: 1

## 2017-03-18 MED FILL — ASPIRIN 81 MG TAB, DELAYED RELEASE: 81 mg | ORAL | Qty: 1

## 2017-03-18 MED FILL — POTASSIUM CHLORIDE SR 10 MEQ TAB: 10 mEq | ORAL | Qty: 1

## 2017-03-18 MED FILL — POTASSIUM CHLORIDE SR 20 MEQ TAB, PARTICLES/CRYSTALS: 20 mEq | ORAL | Qty: 2

## 2017-03-18 MED FILL — PRAVASTATIN 10 MG TAB: 10 mg | ORAL | Qty: 2

## 2017-03-18 MED FILL — LEVOFLOXACIN 500 MG TAB: 500 mg | ORAL | Qty: 1

## 2017-03-18 NOTE — Progress Notes (Signed)
Met with patient in hospital room.Spoke with patient after verifying name and DOB.  Patient shared her frustration regarding no one addressing her back pain and being in hospital or rehab since July. She shared she now has to use walker, will be going back to rehab, and her oxygen level keep dropping. This Probation officer offered encouragement to patient. Will follow up after discharge.

## 2017-03-18 NOTE — Progress Notes (Signed)
Problem: Self Care Deficits Care Plan (Adult)  Goal: *Acute Goals and Plan of Care (Insert Text)  Occupational Therapy Goals  Initiated 03/12/2017    1.  Patient will perform simple grooming tasks in full bed/chair position for at least 5 minutes within 7 days. Upgrade to standing with modified independence  2.  Patient will participate in anterior bathing with Min A from seated position within 7 days.  3.  Patient will perform rolling in bed with Mod A x1 in preparation for toileting and hygiene within 7 days.  4.  Patient will perform toilet transfer with overall Mod A x2 and least restrictive device PRN within 7 days. Upgrade to modified independence 03/16/2017  5.  Patient will participate in all aspects of toileting with overall Mod A x1 within 7 days.  6.  Patient will complete upper body therapeutic exercises/activities to maximize independence and safety with ADLs for at least 5 minutes within 7 days.    Occupational Therapy TREATMENT  Patient: Brittney Shelton [phone](66 y.o. female)  Date: 03/18/2017  Diagnosis: Acute respiratory failure with hypoxia (HCC) <principal problem not specified>      Precautions: Fall, DNR  Chart, occupational therapy assessment, plan of care, and goals were reviewed.    ASSESSMENT:  Pt was seated at bedside chair upon arrival and had been weaned to 50% V[place] with 6 liters NC by respiratory therapy.  Pt was willing to participate in OT services.  Pt reported back pain today and was unable to come to full standing on 4 trials of sit to stand.  O2 sat was 96-100% with activity today.  Towel roll placed at lower back for pain relief and pt reported that this reduced her pain. Remains limited by O2 needs and back pain.  Progression toward goals:  []        Improving appropriately and progressing toward goals  [x]        Improving slowly and progressing toward goals  []        Not making progress toward goals and plan of care will be adjusted     PLAN:   Patient continues to benefit from skilled intervention to address the above impairments.  Continue treatment per established plan of care.  Discharge Recommendations:  Skilled Nursing Facility vs. Home health pending progress and decreased O2 needs  Further Equipment Recommendations for Discharge:  TBD     SUBJECTIVE:   Patient stated ???I will try.  My back is acting up.???    OBJECTIVE DATA SUMMARY:   Cognitive/Behavioral Status:  Neurologic State: Alert  Orientation Level: Oriented X4  Cognition: Follows commands  Perception: Appears intact  Perseveration: No perseveration noted  Safety/Judgement: Awareness of environment;Fall prevention;Home safety  Functional Mobility and Transfers for ADLs:  Transfers:  Sit to Stand: Contact guard assistance;Minimum assistance(4 trials; unable to come to full upright; due to back pain)          Balance:  Sitting: Intact  Standing: (unable to come to full standing today)  ADL Intervention:  Focus on sit to stand  Cognitive Retraining  Safety/Judgement: Awareness of environment;Fall prevention;Home safety    Pain:  Pain Scale 1: Numeric (0 - 10)  Pain Intensity 1: 6  Pain Location 1: Back  Pain Orientation 1: Posterior  Pain Description 1: Aching  Pain Intervention(s) 1: Repositioned  Activity Tolerance:     Please refer to the flowsheet for vital signs taken during this treatment.  After treatment:   [x]  Patient left in no apparent distress  sitting up in chair  []  Patient left in no apparent distress in bed  [x]  Call bell left within reach  [x]  Nursing notified  []  Caregiver present  [x]  Bed alarm activated    COMMUNICATION/COLLABORATION:   The patient???s plan of care was discussed with: Physical Therapist, Registered Nurse and patient    Arlys John, OTR/L  Time Calculation: 32 mins

## 2017-03-18 NOTE — Progress Notes (Addendum)
Bedside shift change report given to Eula ListenMelissa Fernald, RN & Gerlene Burdockravis B., RN (oncoming nurse) by Marchelle FolksAmanda, RN (offgoing nurse). Report included the following information SBAR, Kardex, ED Summary, Procedure Summary, Intake/Output, MAR, Recent Results and Cardiac Rhythm NSR.       1835- Pt has had an uneventful day.  Pt was up in chair until 1530, then her back was bothering her.  Pt had difficulty standing and transferring back to bed d/t back pain.  Pt's purwick leaking on and off through out the day therefore all urine is not accounted for in I/O's.  Pt is in bed now resting comfortably.  Pt states "Back pain is more tolerable" after repositioning in bed and post pain med.     Bedside shift change report given to Shanda BumpsJessica, Charity fundraiserN (oncoming nurse) by Eula ListenMelissa Fernald, RN & Gerlene Burdockravis B, RN (offgoing nurse). Report included the following information SBAR, Kardex, ED Summary, Procedure Summary, Intake/Output, MAR, Recent Results and Cardiac Rhythm NSR.

## 2017-03-18 NOTE — Progress Notes (Signed)
[facility], Critical Care, and Sleep Medicine    Initial Patient Consult    Name: Brittney Shelton MRN: [id number]   DOB: 1950/08/31 Hospital: [facility]   Date: 03/18/2017        IMPRESSION:   ?? Acute respiratory failure  ?? OHS  ?? OSA  ?? Pulmonary HTN  ?? Pulmonary nodules      RECOMMENDATIONS:   ?? Wean O2, DC high flow  ?? Jet nebs  ?? Diurese   ?? Will need out pt pft's  ?? Will need repeat chest ct in 1 year  ?? Mobilize      Subjective:     No acute events overnight  No acute distress  No acute complaints      Current Facility-Administered Medications   Medication Dose Route Frequency   ??? furosemide (LASIX) injection 40 mg  40 mg IntraVENous BID   ??? metoprolol succinate (TOPROL-XL) XL tablet 75 mg  75 mg Oral DAILY   ??? albuterol-ipratropium (DUO-NEB) 2.5 MG-0.5 MG/3 ML  3 mL Nebulization Q6H RT   ??? acetylcysteine (MUCOMYST) 200 mg/mL (20 %) solution 600 mg  600 mg Oral BID   ??? enoxaparin (LOVENOX) injection 40 mg  40 mg SubCUTAneous Q12H   ??? levoFLOXacin (LEVAQUIN) tablet 500 mg  500 mg Oral Q24H   ??? pravastatin (PRAVACHOL) tablet 20 mg  20 mg Oral QHS   ??? aspirin delayed-release tablet 81 mg  81 mg Oral DAILY   ??? methocarbamol (ROBAXIN) tablet 500 mg  500 mg Oral QID   ??? umeclidinium-vilanterol (ANORO ELLIPTA) 62.5 mcg- 25 mcg/inhalation  1 Puff Inhalation DAILY   ??? amLODIPine (NORVASC) tablet 5 mg  5 mg Oral DAILY       Review of Systems:  A comprehensive review of systems was negative except for: Respiratory: positive for dyspnea on exertion    Objective:  Vital Signs:    Visit Vitals  BP 140/57 (BP 1 Location: Right arm, BP Patient Position: At rest)   Pulse 79   Temp 98.1 ??F (36.7 ??C)   Resp 18   Ht 5\' 6"  (1.676 m)   Wt 131.5 kg (290 lb)   SpO2 91%   BMI 46.81 kg/m??       O2 Device: Nasal cannula   O2 Flow Rate (L/min): 6 l/min   Temp (24hrs), Avg:98 ??F (36.7 ??C), Min:97.4 ??F (36.3 ??C), Max:98.8 ??F (37.1 ??C)     Intake/Output:    Last shift:      No intake/output data recorded.  Last 3 shifts: 10/15 1901 - 10/17 0700  In: 1200 [P.O.:1200]  Out: 1650 [Urine:1650]    Intake/Output Summary (Last 24 hours) at 03/18/2017 0955  Last data filed at 03/18/2017 0620  Gross per 24 hour   Intake 720 ml   Output 1150 ml   Net -430 ml      Physical Exam:   General:  Alert, cooperative, no distress, appears stated age.   Head:  Normocephalic, without obvious abnormality, atraumatic.   Eyes:  Conjunctivae/corneas clear. PERRL, EOMs intact.   Nose: Nares normal. Septum midline. Mucosa normal. No drainage or sinus tenderness.   Throat: Lips, mucosa, and tongue normal. Teeth and gums normal.   Neck: Supple, symmetrical, trachea midline, no adenopathy, thyroid: no enlargment/tenderness/nodules, no carotid bruit and no JVD.   Back:   Symmetric, no curvature. ROM normal.   Lungs:   Clear to auscultation bilaterally.   Chest wall:  No tenderness or deformity.  Heart:  Regular rate and rhythm, S1, S2 normal, no murmur, click, rub or gallop.   Abdomen:   Soft, non-tender. Bowel sounds normal. No masses,  No organomegaly.   Extremities: Extremities normal, atraumatic, no cyanosis, ++ edema.   Pulses: 2+ and symmetric all extremities.   Skin: Skin color, texture, turgor normal. No rashes or lesions   Lymph nodes: Cervical, supraclavicular, and axillary nodes normal.   Neurologic: Grossly nonfocal     Data review:     Recent Results (from the past 24 hour(s))   GLUCOSE, POC    Collection Time: 03/17/17 11:35 AM   Result Value Ref Range    Glucose (POC) 256 (H) 65 - 100 mg/dL    Performed by ELI MONIQUE    GLUCOSE, POC    Collection Time: 03/17/17  4:29 PM   Result Value Ref Range    Glucose (POC) 134 (H) 65 - 100 mg/dL    Performed by ELI MONIQUE    GLUCOSE, POC    Collection Time: 03/17/17  9:52 PM   Result Value Ref Range    Glucose (POC) 166 (H) 65 - 100 mg/dL    Performed by Bertram Denveruff Amanda    METABOLIC PANEL, BASIC    Collection Time: 03/18/17  3:02 AM    Result Value Ref Range    Sodium 138 136 - 145 mmol/L    Potassium 3.4 (L) 3.5 - 5.1 mmol/L    Chloride 96 (L) 97 - 108 mmol/L    CO2 38 (H) 21 - 32 mmol/L    Anion gap 4 (L) 5 - 15 mmol/L    Glucose 113 (H) 65 - 100 mg/dL    BUN 33 (H) 6 - 20 MG/DL    Creatinine 1.610.90 0.960.55 - 1.02 MG/DL    BUN/Creatinine ratio 37 (H) 12 - 20      GFR est AA >60 >60 ml/min/1.4973m2    GFR est non-AA >60 >60 ml/min/1.3373m2    Calcium 7.5 (L) 8.5 - 10.1 MG/DL   GLUCOSE, POC    Collection Time: 03/18/17  7:18 AM   Result Value Ref Range    Glucose (POC) 105 (H) 65 - 100 mg/dL    Performed by ELI MONIQUE        Imaging:  I have personally reviewed the patient???s radiographs and have reviewed the reports:

## 2017-03-18 NOTE — Progress Notes (Signed)
[address], [place], [place] [id number]  [phone]  Cardiology Progress Note      03/18/2017 1035AM    Admit Date: 03/11/2017    Admit Diagnosis:   Acute respiratory failure with hypoxia (HCC)    Subjective:     Brittney Shelton is a 66 y.o. female with PMH COPD, HF, HTN, DM who was admitted for Acute respiratory failure with hypoxia (HCC).    Overnight events:  -VSS  -creat 1.21; Na 132  -weight is same as admission weight; I/o shows neg 400ml with supposed net 10L neg  -Brittney Shelton states she is feeling well.  No changes from yesterday other than she's having a little more back pain.        Visit Vitals  BP 140/57 (BP 1 Location: Right arm, BP Patient Position: At rest)   Pulse 79   Temp 98.1 ??F (36.7 ??C)   Resp 18   Ht 5\' 6"  (1.676 m)   Wt 131.5 kg (290 lb)   SpO2 94%   BMI 46.81 kg/m??       Current Facility-Administered Medications   Medication Dose Route Frequency   ??? albuterol-ipratropium (DUO-NEB) 2.5 MG-0.5 MG/3 ML  3 mL Nebulization Q6H PRN   ??? furosemide (LASIX) injection 40 mg  40 mg IntraVENous BID   ??? metoprolol succinate (TOPROL-XL) XL tablet 75 mg  75 mg Oral DAILY   ??? acetylcysteine (MUCOMYST) 200 mg/mL (20 %) solution 600 mg  600 mg Oral BID   ??? enoxaparin (LOVENOX) injection 40 mg  40 mg SubCUTAneous Q12H   ??? acetaminophen (TYLENOL) tablet 650 mg  650 mg Oral Q6H PRN   ??? HYDROcodone-acetaminophen (NORCO) 5-325 mg per tablet 1 Tab  1 Tab Oral Q6H PRN   ??? levoFLOXacin (LEVAQUIN) tablet 500 mg  500 mg Oral Q24H   ??? pravastatin (PRAVACHOL) tablet 20 mg  20 mg Oral QHS   ??? aspirin delayed-release tablet 81 mg  81 mg Oral DAILY   ??? methocarbamol (ROBAXIN) tablet 500 mg  500 mg Oral QID   ??? umeclidinium-vilanterol (ANORO ELLIPTA) 62.5 mcg- 25 mcg/inhalation  1 Puff Inhalation DAILY   ??? amLODIPine (NORVASC) tablet 5 mg  5 mg Oral DAILY       Objective:      Physical Exam:  General: pleasant, obese AAF sitting in chair in NAD  Heart: RRR, split s2   Lungs: clear/dim    Abdomen: Soft, +BS, NTND   Extremities: LE bil +DP/PT, ? Lymphedema vs obesity.  Chronic appearance and difficult to determine if any edema.   Neurologic: Grossly normal  Skin:?? Warm and dry.??    Data Review:   Recent Labs     03/16/17  0350   WBC 8.9   HGB 11.7   HCT 37.1   PLT 226     Recent Labs     03/18/17  0302 03/16/17  0350   NA 138 136   K 3.4* 4.1   CL 96* 97   CO2 38* 36*   GLU 113* 190*   BUN 33* 28*   CREA 0.90 0.94   CA 7.5* 8.0*       No results for input(s): TROIQ, CPK, CKMB in the last 72 hours.      Intake/Output Summary (Last 24 hours) at 03/18/2017 1130  Last data filed at 03/18/2017 1015  Gross per 24 hour   Intake 820 ml   Output 1150 ml   Net -330 ml  Telemetry:  SR 1st degree  ECG: read as afib (tele review did not show a fib)    Echocardiogram: EF 60-65%; NWMA; mild TR; mod PHTN   CXRAY: "Study is compromised by patient's body habitus. There is question of a vague patchy density at the right lung base. PA and lateral views are recommended when the patient is stable for further assessment."  CTA chest: "No evidence of acute pulmonary embolus. Unchanged enlargement of the main pulmonary artery, compatible with pulmonary hypertension. Unchanged mediastinal lymphadenopathy. Bilateral indeterminate, ill-defined pulmonary nodules, measuring up to 5 mm; consider follow-up CT in 12 months to assure stability or  resolution. 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 CTA neg for PE.  Troponin neg.  proBNP [id number].  Patient has felt at her baseline.  COPD history.  ECHO shows pulmonary hypertension   ?? Remains on IV lasix.  Weight today is the same weight as on admission despite a supposed 10L deficit per documentation.  Patient has felt relatively the same respiratory-wise since admission.    ?? Pulmonary following         HTN:  BP stable   ?? Multiple home meds still on hold  ?? Continue lower dose of amlodipine and current dose of toprol         [name], NP  DNP, RN, AGACNP-BC

## 2017-03-18 NOTE — Progress Notes (Signed)
Hospitalist Progress Note  NAME: Brittney Shelton   DOB:  Feb 25, 1951   MRN:  [id number]       Assessment / Plan:  Acute on chronic hypoxic respiratory failure POA with baseline 4-5L of O2  Due to Acute on chronic diastolic congestive heart failure in settings of moderate Pulmonary HTN (ProBNP with 40981 which is significantly elevated in morbidly obese pt)  COPD at baseline, doubt exacerbation with no significant cough and not wheezing  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 There were no regional wall motion abnormalities, was moderate pulmonary hypertension.  Continue Lasix 40mg  iV BID  Stopped IV steroids earlier admission as pulmonary doesn't believe COPD exacerbation and not improvement on steroids and clinically doesn't seems COPD exacerbation  monitor I/Os, daily weights  Continue Nebs  Complete course of Px Levaquin  Cardiology and pulmonary consult appreciated  Cont home anoro ellipta or formulary equivalent  O2 now started to improve, now on 6L    Lumbar degenerative disc disese/back pain and Left knee pain  XR spine, degenerative changes L3-S1, marked disc space narrowing at L4-L5, facet arthropathy L3-S1  PT/OT  Appreciate ortho help with knee injection and they will like to follow patient as an OP. She is not currently candidate for more aggressive ortho intervention due to pulmonary status  ??  DM2 - Diet Controlled  Last A1C on record 11/2016 is 5.8. No meds listed PTA  Current hyperglycemia seems related to Steroids  Continue SSI for now   ??  HTN  Cont toprol and amlodipine   ??  HLD  Cont home pravastatin  ??  Code Status:   Surrogate Decision Maker: Justine Null [phone]  ??  DVT Prophylaxis: sq heparin  GI Prophylaxis: not indicated  ??  Baseline: lives at home with family     Subjective: Pt seen and examined at bedside. O2 sat now improving, breathing better. Now new complaints otherwise. Overnight events d/w RN      Chief Complaint / Reason for Physician Visit: f/u "Respiratory Failure"    Review of Systems:  Symptom Y/N Comments  Symptom Y/N Comments   Fever/Chills n   Chest Pain n    Poor Appetite    Edema     Cough n   Abdominal Pain n    Sputum    Joint Pain y Knee and back pain   SOB/DOE y   Pruritis/Rash     Nausea/vomit    Tolerating PT/OT     Diarrhea    Tolerating Diet     Constipation    Other       Could NOT obtain due to:      Objective:     VITALS:   Last 24hrs VS reviewed since prior progress note. Most recent are:  Patient Vitals for the past 24 hrs:   Temp Pulse Resp BP SpO2   03/18/17 1426 98.1 ??F (36.7 ??C) 78 18 139/71 92 %   03/18/17 1134 98.7 ??F (37.1 ??C) 74 18 139/65 94 %   03/18/17 1016 ??? ??? ??? ??? 94 %   03/18/17 1001 ??? ??? ??? ??? 94 %   03/18/17 0933 ??? ??? ??? ??? 91 %   03/18/17 0925 ??? ??? ??? ??? 97 %   03/18/17 0821 ??? 79 ??? ??? ???   03/18/17 0803 ??? ??? ??? ??? 92 %   03/18/17 0720 98.1 ??F (36.7 ??C) 77 18 140/57 91 %   03/18/17  0304 98 ??F (36.7 ??C) 70 18 125/59 95 %   03/18/17 0138 ??? ??? ??? ??? 94 %   03/17/17 2250 97.9 ??F (36.6 ??C) 64 18 121/87 98 %   03/17/17 1907 97.8 ??F (36.6 ??C) 68 18 122/59 94 %       Intake/Output Summary (Last 24 hours) at 03/18/2017 1553  Last data filed at 03/18/2017 1015  Gross per 24 hour   Intake 580 ml   Output 850 ml   Net -270 ml        PHYSICAL EXAM:  General: WD, WN. Alert, cooperative, no acute distress????  EENT:  EOMI. Anicteric sclerae. MMM  Resp:  BL basilar crackle, no wheezing or rales.  No accessory muscle use  CV:  Regular  rhythm,?? No edema  GI:  Soft, Non distended, Non tender. ??+Bowel sounds  Neurologic:?? Alert and oriented X 3, normal speech, at baseline  Psych:???? Good insight.??Not anxious nor agitated  Skin:  No rashes.  No jaundice    Reviewed most current lab test results and cultures  YES  Reviewed most current radiology test results   YES  Review and summation of old records today    NO  Reviewed patient's current orders and MAR    YES  PMH/SH reviewed - no change compared to H&P   ________________________________________________________________________  Care Plan discussed with:    Comments   Patient x    Family      RN x    Care Manager     Consultant                        Multidiciplinary team rounds were held today with case manager, nursing, pharmacist and Higher education careers adviserclinical coordinator.  Patient's plan of care was discussed; medications were reviewed and discharge planning was addressed.     ________________________________________________________________________  Total NON critical care TIME: 20 Minutes    Total CRITICAL CARE TIME Spent:   Minutes non procedure based      Comments   >50% of visit spent in counseling and coordination of care     ________________________________________________________________________  Jamesetta SoMushtaq Johnathan Heskett, MD     Procedures: see electronic medical records for all procedures/Xrays and details which were not copied into this note but were reviewed prior to creation of Plan.      LABS:  I reviewed today's most current labs and imaging studies.  Pertinent labs include:  Recent Labs     03/16/17  0350   WBC 8.9   HGB 11.7   HCT 37.1   PLT 226     Recent Labs     03/18/17  0302 03/16/17  0350   NA 138 136   K 3.4* 4.1   CL 96* 97   CO2 38* 36*   GLU 113* 190*   BUN 33* 28*   CREA 0.90 0.94   CA 7.5* 8.0*       Signed: Jamesetta SoMushtaq Clarisse Rodriges, MD

## 2017-03-18 NOTE — Progress Notes (Addendum)
0800- Pt assisted up to recliner with 1 assist.    0900- Resp therapy notified to take pt off High flow 02 per Dr. Marcos EkeGonzales.     1430- Pt assisted back to bed with 2 assist. Currently on 5 L NC and 50% venti mask.

## 2017-03-19 LAB — GLUCOSE, POC
Glucose (POC): 155 mg/dL — ABNORMAL HIGH (ref 65–100)
Glucose (POC): 115 mg/dL — ABNORMAL HIGH (ref 65–100)
Glucose (POC): 207 mg/dL — ABNORMAL HIGH (ref 65–100)
Glucose (POC): 167 mg/dL — ABNORMAL HIGH (ref 65–100)

## 2017-03-19 LAB — METABOLIC PANEL, BASIC
Anion gap: 6 mmol/L (ref 5–15)
BUN/Creatinine ratio: 34 — ABNORMAL HIGH (ref 12–20)
BUN: 28 MG/DL — ABNORMAL HIGH (ref 6–20)
CO2: 36 mmol/L — ABNORMAL HIGH (ref 21–32)
Calcium: 7.7 MG/DL — ABNORMAL LOW (ref 8.5–10.1)
Chloride: 98 mmol/L (ref 97–108)
Creatinine: 0.83 MG/DL (ref 0.55–1.02)
GFR est AA: >60 mL/min/{1.73_m2} (ref >60)
GFR est non-AA: >60 mL/min/{1.73_m2} (ref >60)
Glucose: 114 mg/dL — ABNORMAL HIGH (ref 65–100)
Potassium: 4.1 mmol/L (ref 3.5–5.1)
Sodium: 140 mmol/L (ref 136–145)

## 2017-03-19 MED ORDER — DOCUSATE SODIUM 100 MG CAP
100 mg | Freq: Every day | ORAL | Status: DC | PRN
Start: 2017-03-19 — End: 2017-04-04

## 2017-03-19 MED ORDER — POLYETHYLENE GLYCOL 3350 17 GRAM (100 %) ORAL POWDER PACKET
17 gram | Freq: Every day | ORAL | Status: DC
Start: 2017-03-19 — End: 2017-04-04
  Administered 2017-03-20 – 2017-04-02 (×11): via ORAL

## 2017-03-19 MED FILL — METHOCARBAMOL 500 MG TAB: 500 mg | ORAL | Qty: 1

## 2017-03-19 MED FILL — FUROSEMIDE 10 MG/ML IJ SOLN: 10 mg/mL | INTRAMUSCULAR | Qty: 4

## 2017-03-19 MED FILL — DOK 100 MG CAPSULE: 100 mg | ORAL | Qty: 1

## 2017-03-19 MED FILL — AMLODIPINE 5 MG TAB: 5 mg | ORAL | Qty: 1

## 2017-03-19 MED FILL — ACETYLCYSTEINE 20 % (200 MG/ML) SOLN: 200 mg/mL (20 %) | Qty: 4

## 2017-03-19 MED FILL — LOVENOX 40 MG/0.4 ML SUBCUTANEOUS SYRINGE: 40 mg/0.4 mL | SUBCUTANEOUS | Qty: 0.4

## 2017-03-19 MED FILL — PRAVASTATIN 10 MG TAB: 10 mg | ORAL | Qty: 2

## 2017-03-19 MED FILL — HYDROCODONE-ACETAMINOPHEN 5 MG-325 MG TAB: 5-325 mg | ORAL | Qty: 1

## 2017-03-19 MED FILL — ASPIRIN 81 MG TAB, DELAYED RELEASE: 81 mg | ORAL | Qty: 1

## 2017-03-19 MED FILL — LEVOFLOXACIN 500 MG TAB: 500 mg | ORAL | Qty: 1

## 2017-03-19 MED FILL — POTASSIUM CHLORIDE SR 20 MEQ TAB, PARTICLES/CRYSTALS: 20 mEq | ORAL | Qty: 2

## 2017-03-19 MED FILL — TOPROL XL 25 MG TABLET,EXTENDED RELEASE: 25 mg | ORAL | Qty: 1

## 2017-03-19 NOTE — Progress Notes (Addendum)
PCU SHIFT NURSING NOTE    Bedside and Verbal shift change report given to Denyse Amassorey, Charity fundraiserN (oncoming nurse) by Shanda BumpsJessica, RN (offgoing nurse). Report included the following information SBAR, Kardex, MAR, Recent Results and Cardiac Rhythm NSR.    Shift Summary:   0730:  Pt resting, no signs of distress.  Received pt on 4L O2 NC and 40% venti mask.  Pt rates back pain 8/10.  Will medicate with PRN pain meds.  0900:  Pt bathed and up in chair.  Pt is complaining of back pain.  Pt states it is worse today.  Will continue to monitor.  1150:  Pt had voided x 2 and it was on the floor.  Changed pad under pt in chair.  Repositioned purewick.  Pt complains of not feeling well today.  When asked she states that her back hurts and it has spasms.  When asked about breathing she states that is fine. Pt also complaining of some pain in stomach, states no BM for a few days. Paged and spoke with Dr. Alanson AlyAnis about back pain, no new orders at this time.  1210:  Spoke with hospitalist NP during rounds.  Received orders for stool softeners.  1315:  Placed pt on 35% venti mask to attempt to wean.  O2 sats 94% when adjusted.  WIll monitor.  1430:  Pt sats dropped to 85% on 35% venti.  Increased back to 40% venti mask.  1630:  Per PT they were unable to get pt to stand from the chair due to back pain.  Will medicate with PRN meds.  1845:  Pt incontinent.  Pt able to stand with 2 assisted and get back to bed.  Pt repositioned in bed and purewick changed.    Admission Date 03/11/2017   Admission Diagnosis Acute respiratory failure with hypoxia (HCC)   Consults IP CONSULT TO HOSPITALIST  IP CONSULT TO CARDIOLOGY  IP CONSULT TO PALLIATIVE CARE - PROVIDER  IP CONSULT TO INTENSIVIST  IP CONSULT TO ORTHOPEDIC SURGERY        Consults   [x] PT   [x] OT   [] Speech   [x] Case Management      []  Palliative      Cardiac Monitoring Order   [x] Yes   [] No     IV drips   [] Yes    Drip:                            Dose:  Drip:                            Dose:   Drip:                            Dose:   [x] No     GI Prophylaxis   [] Yes   [] No         DVT Prophylaxis   SCDs:             Ted stockings:         [x]  Medication   [] Contraindicated   [] None      Activity Level Activity Level: Up with Assistance     Activity Assistance: Partial (two people)   Purposeful Rounding every 1-2 hour?   [x] Yes   Schmidt Score  Total Score: 3   Bed Alarm (If score 3 or >)   [] Yes   []   Refused (See signed refusal form in chart)   Braden Score  Braden Score: 17   Braden Score (if score 14 or less)   [] PMT consult   [] Wound Care consult      [] Specialty bed   []  Nutrition consult          Needs prior to discharge:   Home O2 required:    [x] Yes   [] No    If yes, how much O2 required? 4L NC O2    Other:    Last Bowel Movement: Last Bowel Movement Date: 03/15/17      Influenza Vaccine Received Flu Vaccine for Current Season (usually Sept-March): No    Patient/Guardian Refused (Notify MD): Yes   Pneumonia Vaccine           Diet Active Orders   Diet    DIET CARDIAC Regular      LDAs               Peripheral IV 03/12/17 Left Antecubital (Active)   Site Assessment Clean, dry, & intact 03/19/2017 12:40 AM   Phlebitis Assessment 0 03/19/2017 12:40 AM   Infiltration Assessment 0 03/19/2017 12:40 AM   Dressing Status Clean, dry, & intact 03/19/2017 12:40 AM   Dressing Type Transparent 03/19/2017 12:40 AM   Hub Color/Line Status Pink;Flushed;Capped 03/18/2017  2:26 PM   Action Taken Open ports on tubing capped 03/15/2017  6:00 PM   Alcohol Cap Used Yes 03/15/2017  6:00 PM          External Female Catheter 03/12/17 (Active)   Site Assessment Clean, dry, & intact 03/19/2017 12:40 AM   Repositioned Yes 03/19/2017 12:40 AM   Perineal Care Yes 03/19/2017 12:40 AM   Wick Changed Yes 03/19/2017 12:40 AM   Suction Canister/Tubing Changed No 03/19/2017 12:40 AM   Urine Output (mL) 240 ml 03/18/2017  2:26 PM                Urinary Catheter      Intake & Output Date 03/18/17 0700 - 03/19/17 0659 03/19/17 0700 -  03/20/17 0659   Shift 0700-1859 1900-0659 24 Hour Total 0700-1859 1900-0659 24 Hour Total   INTAKE   P.O. 820  820        P.O. 820  820      Shift Total(mL/kg) 820(6.2)  820(6.4)      OUTPUT   Urine(mL/kg/hr) 240(0.2)  240(0.1) 1200  1200     Urine Voided    1200  1200     Urine Occurrence(s) 1 x  1 x        Urine Output (mL) (External Female Catheter 03/12/17) 240  240      Shift Total(mL/kg) 240(1.8)  240(1.9) 1200(9.3)  1200(9.3)   NET 580  580 -1200  -1200   Weight (kg) 131.5 128.8 128.8 128.8 128.8 128.8         Readmission Risk Assessment Tool Score High Risk            36       Total Score        3 Has Seen PCP in Last 6 Months (Yes=3, No=0)    3 Patient Length of Stay (>5 days = 3)    4 IP Visits Last 12 Months (1-3=4, 4=9, >4=11)    9 Pt. Coverage (Medicare=5 , Medicaid, or Self-Pay=4)    17 Charlson Comorbidity Score (Age + Comorbid Conditions)        Criteria that do not apply:  Married. Living with Significant Other. Assisted Living. LTAC. SNF. or   Rehab       Expected Length of Stay 4d 2h   Actual Length of Stay 7

## 2017-03-19 NOTE — Progress Notes (Signed)
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 There were no regional wall motion abnormalities, was moderate pulmonary hypertension.  Continue Lasix 40mg  iV BID  Stopped IV steroids earlier admission as pulmonary doesn't believe COPD exacerbation and not improvement on steroids and clinically doesn't seems COPD exacerbation  monitor I/Os, daily weights  Continue Nebs  Complete course of Px Levaquin  Cardiology and pulmonary consult appreciated  Cont home anoro ellipta or formulary equivalent  Off High Flow O2, now on 6L but was satuaring in 70s on 6L so we added back Ventimask along with 6L O2, now sat 90-91 on 6L O2 + Ventimask    Lumbar degenerative disc disese/back pain and Left knee pain  XR spine, degenerative changes L3-S1, marked disc space narrowing at L4-L5, facet arthropathy L3-S1  PT/OT  Appreciate ortho help with knee injection and they will like to follow patient as an OP. She is not currently candidate for more aggressive ortho intervention due to pulmonary status  ??  DM2 - Diet Controlled  Last A1C on record 11/2016 is 5.8. No meds listed PTA  Current hyperglycemia seems related to Steroids  Continue SSI for now   ??  HTN  Cont toprol and amlodipine   ??  HLD  Cont home pravastatin  ??  Code Status:   Surrogate Decision Maker: Justine Null [phone]  ??  DVT Prophylaxis: sq heparin  GI Prophylaxis: not indicated  ??  Baseline: lives at home with family     Subjective: Pt seen and examined at bedside. O2 sat in 70s on 6L o2. Feels  the same. Overnight events d/w RN     Chief Complaint / Reason for Physician Visit: f/u "Respiratory Failure"    Review of Systems:  Symptom Y/N Comments  Symptom Y/N Comments   Fever/Chills n   Chest Pain n    Poor Appetite    Edema     Cough n   Abdominal Pain n    Sputum    Joint Pain y Knee and back pain   SOB/DOE y   Pruritis/Rash     Nausea/vomit    Tolerating PT/OT     Diarrhea    Tolerating Diet     Constipation    Other       Could NOT obtain due to:      Objective:     VITALS:   Last 24hrs VS reviewed since prior progress note. Most recent are:  Patient Vitals for the past 24 hrs:   Temp Pulse Resp BP SpO2   03/19/17 0945 ??? 95 ??? 130/88 ???   03/19/17 0722 98.1 ??F (36.7 ??C) 86 20 147/76 90 %   03/18/17 1958 97.8 ??F (36.6 ??C) ??? ??? ??? ???   03/18/17 1930 ??? 85 ??? 143/73 91 %   03/18/17 1724 ??? 77 ??? 130/62 ???   03/18/17 1426 98.1 ??F (36.7 ??C) 78 18 139/71 92 %   03/18/17 1134 98.7 ??F (37.1 ??C) 74 18 139/65 94 %  Intake/Output Summary (Last 24 hours) at 03/19/2017 1101  Last data filed at 03/19/2017 0711  Gross per 24 hour   Intake 480 ml   Output 1440 ml   Net -960 ml        PHYSICAL EXAM:  General: WD, WN. Alert, cooperative, no acute distress????  EENT:  EOMI. Anicteric sclerae. MMM  Resp:  BL basilar crackle, no wheezing or rales.  No accessory muscle use  CV:  Regular  rhythm,?? No edema  GI:  Soft, Non distended, Non tender. ??+Bowel sounds  Neurologic:?? Alert and oriented X 3, normal speech, at baseline  Psych:???? Good insight.??Not anxious nor agitated  Skin:  No rashes.  No jaundice    Reviewed most current lab test results and cultures  YES  Reviewed most current radiology test results   YES  Review and summation of old records today    NO  Reviewed patient's current orders and MAR    YES  PMH/SH reviewed - no change compared to H&P  ________________________________________________________________________  Care Plan discussed with:    Comments   Patient x    Family      RN x    Care Manager     Consultant                         Multidiciplinary team rounds were held today with case manager, nursing, pharmacist and Higher education careers adviserclinical coordinator.  Patient's plan of care was discussed; medications were reviewed and discharge planning was addressed.     ________________________________________________________________________  Total NON critical care TIME: 20 Minutes    Total CRITICAL CARE TIME Spent:   Minutes non procedure based      Comments   >50% of visit spent in counseling and coordination of care     ________________________________________________________________________  Jamesetta SoMushtaq Sunny Aguon, MD     Procedures: see electronic medical records for all procedures/Xrays and details which were not copied into this note but were reviewed prior to creation of Plan.      LABS:  I reviewed today's most current labs and imaging studies.  Pertinent labs include:  No results for input(s): WBC, HGB, HCT, PLT, HGBEXT, HCTEXT, PLTEXT, HGBEXT, HCTEXT, PLTEXT in the last 72 hours.  Recent Labs     03/19/17  0425 03/18/17  0302   NA 140 138   K 4.1 3.4*   CL 98 96*   CO2 36* 38*   GLU 114* 113*   BUN 28* 33*   CREA 0.83 0.90   CA 7.7* 7.5*       Signed: Jamesetta SoMushtaq Annalis Kaczmarczyk, MD

## 2017-03-19 NOTE — Progress Notes (Signed)
[address], [place], [place] [id number]  [phone]  Cardiology Progress Note      03/19/2017 11:15 AM    Admit Date: 03/11/2017    Admit Diagnosis:   Acute respiratory failure with hypoxia (HCC)    Subjective:     Brittney Shelton     Breathing a little better; off high flow oxygen    Visit Vitals  BP 130/88   Pulse 95   Temp 98.1 ??F (36.7 ??C)   Resp 20   Ht 5\' 6"  (1.676 m)   Wt 283 lb 15.2 oz (128.8 kg)   SpO2 90%   BMI 45.83 kg/m??       Current Facility-Administered Medications   Medication Dose Route Frequency   ??? albuterol-ipratropium (DUO-NEB) 2.5 MG-0.5 MG/3 ML  3 mL Nebulization Q6H PRN   ??? furosemide (LASIX) injection 40 mg  40 mg IntraVENous BID   ??? metoprolol succinate (TOPROL-XL) XL tablet 75 mg  75 mg Oral DAILY   ??? acetylcysteine (MUCOMYST) 200 mg/mL (20 %) solution 600 mg  600 mg Oral BID   ??? enoxaparin (LOVENOX) injection 40 mg  40 mg SubCUTAneous Q12H   ??? acetaminophen (TYLENOL) tablet 650 mg  650 mg Oral Q6H PRN   ??? HYDROcodone-acetaminophen (NORCO) 5-325 mg per tablet 1 Tab  1 Tab Oral Q6H PRN   ??? pravastatin (PRAVACHOL) tablet 20 mg  20 mg Oral QHS   ??? aspirin delayed-release tablet 81 mg  81 mg Oral DAILY   ??? methocarbamol (ROBAXIN) tablet 500 mg  500 mg Oral QID   ??? umeclidinium-vilanterol (ANORO ELLIPTA) 62.5 mcg- 25 mcg/inhalation  1 Puff Inhalation DAILY   ??? amLODIPine (NORVASC) tablet 5 mg  5 mg Oral DAILY       Objective:      Physical Exam:  General Appearance: ??  Chest:?? ??Clear  Cardiovascular: ??Regular rate and rhythm, no murmur.??  Extremities: 3+ edema  Skin:?? Warm and dry.??    Data Review:   No results for input(s): WBC, HGB, HCT, PLT, HGBEXT, HCTEXT, PLTEXT in the last 72 hours.  Recent Labs     03/19/17  0425 03/18/17  0302   NA 140 138   K 4.1 3.4*   CL 98 96*   CO2 36* 38*   GLU 114* 113*   BUN 28* 33*   CREA 0.83 0.90   CA 7.7* 7.5*       No results for input(s): TROIQ, CPK, CKMB in the last 72 hours.      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  Cr OK.  Continue diuresis.    Abigail MiyamotoBrian Lowry Bala, M.D., [facility]ACC

## 2017-03-19 NOTE — Progress Notes (Signed)
Spoke with patient re dcp. She has a list of snf that [employer] Group[employer]ather Hudson CM had given her first part of week.  Talked with patient about [facility] and she seemed interested. She would like to talk it over with her niece and sister in law and will let me know.

## 2017-03-19 NOTE — Progress Notes (Addendum)
Physical Therapy Goals  Updated 03/19/2017  1. Patient will move from supine to sit and sit to supine in bed with modified independence within 7 day(s).   2. Patient will transfer from bed to chair and chair to bed with modified independence using the least restrictive device within 7 day(s).  3. Patient will perform sit to stand with modified independence within 7 day(s).  4. Patient will ambulate with supervision/set-up for 100 feet with the least restrictive device within 7 day(s).   5. Patient will ascend/descend 3 stairs with 1 handrail(s) with minimal assistance within 7 day(s)    Physical Therapy Goals  Initiated 03/12/2017  1. Patient will move from supine to sit and sit to supine in bed with modified independence within 7 day(s).   2. Patient will transfer from bed to chair and chair to bed with modified independence using the least restrictive device within 7 day(s).  3. Patient will perform sit to stand with modified independence within 7 day(s).  4. Patient will ambulate with supervision/set-up for 50 feet with the least restrictive device within 7 day(s).   5. Patient will ascend/descend 3 stairs with 1 handrail(s) with minimal assistance/contact guard assist within 7 day(s).     physical Therapy TREATMENT: WEEKLY REASSESSMENT  Patient: Brittney Shelton [phone](66 y.o. female)  Date: 03/19/2017  Diagnosis: Acute respiratory failure with hypoxia (HCC) <principal problem not specified>      Precautions: Fall, DNR  Chart, physical therapy assessment, plan of care and goals were reviewed.    ASSESSMENT:  Pt presented sitting in chair, needing encouragement to participate in therapy session. Pt with increased complaint of LBP throughout the day. Pt with O2 sats stable throughout session with NC and venti mask at 4 L/min and FiO2 at 40%. Pt attempting initial sit to stand transfer with x2 assist, but after counting down pt pushing posteriorly so transfer  stopped. After verbal instruction and demonstration on desired weight shift with transfer pt attempted second transfer, able to move through 75% of transfer with MAXAx2, but pt unable to come standing fully upright nor fully straighten knees and hips. Pt returned to sitting with increased complaint of back pain, RN notified for pain management. Pt remained sitting in chair to end session with PCT present in room.   Patient's progression toward goals since last assessment: Pt has been able to demonstrate improvement towards goals in previous sessions earlier in the week, amb with RW up to 60 ft- surpassing initial amb distance goal. At current presentation pt has been limited by increased back pain, reducing her activity tolerance,her ability to perform transfers,  and her amb.      PLAN:  Goals have been updated based on progression since last assessment.  Patient continues to benefit from skilled intervention to address the above impairments.  Continue to follow the patient 4 times a week to address goals.  Planned Interventions:  [x]               Bed Mobility Training             []        Neuromuscular Re-Education  [x]               Transfer Training                   []        Orthotic/Prosthetic Training  [x]               Gait Training                         []   Modalities  [x]               Therapeutic Exercises           []        Edema Management/Control  [x]               Therapeutic Activities            [x]        Patient and Family Training/Education  []               Other (comment):  Discharge Recommendations: Skilled Nursing Facility  Further Equipment Recommendations for Discharge: Defer     SUBJECTIVE:   Patient stated ???I can't my back is killing me.???    OBJECTIVE DATA SUMMARY:   Critical Behavior:  Neurologic State: Alert  Orientation Level: Oriented X4  Cognition: Appropriate decision making, Appropriate for age attention/concentration, Appropriate safety awareness, Follows commands   Safety/Judgement: Awareness of environment, Fall prevention, Home safety  Strength:   Strength: Generally decreased, functional                      Functional Mobility Training:  Bed Mobility: Not observed as pt received and returned to sitting in chair                    Transfers:  Sit to Stand: Other (comment)(Unable to complete 2/2 LBP)                                Balance:  Sitting: Intact  Standing: Impaired  Standing - Static: Fair  Pain:  Pain Scale 1: Numeric (0 - 10)  Pain Intensity 1: 6  Pain Location 1: Back           Activity Tolerance:   Pt with poor activity tolerance due to increased back pain on L side   Please refer to the flowsheet for vital signs taken during this treatment.  After treatment:   [x]   Patient left in no apparent distress sitting up in chair  []   Patient left in no apparent distress in bed  [x]   Call bell left within reach  [x]   Nursing notified  [x]   Caregiver present  []   Bed alarm activated    COMMUNICATION/COLLABORATION:   The patient???s plan of care was discussed with: Registered Nurse    Brittney Shelton, SPT    Time Calculation: 18 mins    Regarding student involvement in patient care:  A student participated in this treatment session. Per CMS Medicare statements and APTA guidelines I certify that the following was true:  1. I was present and directly observed the entire session.  2. I made all skilled judgments and clinical decisions regarding care.  3. I am the practitioner responsible for assessment, treatment, and documentation.

## 2017-03-19 NOTE — Progress Notes (Signed)
Initial Nutrition Assessment:    INTERVENTIONS/RECOMMENDATIONS:   ?? Meals/Snacks: General/healthful diet: Cardiac diet    ASSESSMENT:   Patient medically noted for respiratory failure. PMH for COPD, DM, and HTN. Patient reports a good appetite and eating well. Only complaint at this time is her back pain. Excellent PO per flowsheets; eating 75-100% of meals. BG fairly well controlled. Using menu and room service.     Diet Order: Cardiac  % Eaten:    Patient Vitals for the past 72 hrs:   % Diet Eaten   03/18/17 1741 100 %   03/18/17 1426 100 %   03/18/17 1015 90 %   03/17/17 1730 70 %   03/17/17 1445 75 %   03/17/17 1004 85 %       Pertinent Medications: [x] Reviewed [] Other: Norvasc, Lasix, Miralax, Pravastatin, PRN colace  Pertinent Labs: [x] Reviewed [] Other: BG [phone]  Food Allergies: [x] None [] Other   Last BM: 10/14  [x] Active     [] Hyperactive  [] Hypoactive       []  Absent BS  Skin:    [x]  Intact   []  Incision  []  Breakdown: [x]  Edema [] Other:    Anthropometrics:   Height: 5\' 6"  (167.6 cm) Weight: 128.8 kg (283 lb 15.2 oz)   IBW (%IBW):   ( ) UBW (%UBW):   (  %)   Last Weight Metrics:  Weight Loss Metrics 03/19/2017 03/11/2017 01/06/2017 12/17/2016 12/13/2016 12/10/2016 09/12/2016   Today's Wt 283 lb 15.2 oz - 298 lb 315 lb 300 lb 300 lb 289 lb 4.8 oz   BMI - 45.83 kg/m2 48.1 kg/m2 50.84 kg/m2 48.42 kg/m2 48.42 kg/m2 46.69 kg/m2       BMI: Body mass index is 45.83 kg/m??.    This BMI is indicative of:   [] Underweight    [] Normal    [] Overweight    []  Obesity   [x]  Extreme Obesity (BMI>40)     Estimated Nutrition Needs (Based on):   1888 Kcals/day(BMR (1837) x 1.3AF -500kcal) , 103 g(0.8 g/kg bw) Protein  Carbohydrate: At Least 130 g/day  Fluids: 1800 mL/day (181ml/kcal)    Pt expected to meet estimated nutrient needs: [x] Yes [] No    NUTRITION DIAGNOSES:   Problem:  No nutritional diagnosis at this time      Etiology: related to       Signs/Symptoms: as evidenced by        NUTRITION INTERVENTIONS:   Meals/Snacks: General/healthful diet                  GOAL:   PO intake >75% of meals next 5-7 days    LEARNING NEEDS (Diet, Food/Nutrient-Drug Interaction):    [x]  None Identified   []  Identified and Education Provided/Documented   []  Identified and Pt declined/was not appropriate     Cultural, Religious, OR Ethnic Dietary Needs:    [x]  None Identified   []  Identified and Addressed     [x]  Interdisciplinary Care Plan Reviewed/Documented    [x]  Discharge Planning: Heart healthy diet       MONITORING /EVALUATION:   Food/Nutrient Intake Outcomes: Total energy intake  Physical Signs/Symptoms Outcomes: Weight/weight change, Glucose profile    NUTRITION RISK:    []  High              []  Moderate           [x]   Low  []   Minimal/Uncompromised    PT SEEN FOR:    []   MD Consult: [] Calorie Count      []   Diabetic Diet Education        '[]' Diet Education     '[]' Electrolyte Management     '[]' General Nutrition Management and Supplements     '[]' Management of Tube Feeding     '[]' TPN Recommendations    '[]'   RN Referral:  '[]' MST score >=2     '[]' Enteral/Parenteral Nutrition PTA     '[]' Pregnant: Gestational DM or Multigestation     '[]' Pressure Ulcer/Wound Care needs        '[]'   Low BMI  '[x]'   LOS    Cromie Roam  Pager [phone]                 Weekend Pager [phone]

## 2017-03-19 NOTE — Progress Notes (Signed)
[facility] Critical Care, [facility] Sleep Medicine    Name: Brittney Shelton MRN: [id number]   DOB: 03-21-1951 Hospital: [facility]   Date: 03/19/2017        IMPRESSION:   ?? Acute respiratory failure  ?? OHS  ?? OSA  ?? Pulmonary HTN  ?? Pulmonary nodules      RECOMMENDATIONS:   ?? Wean O2  ?? Jet nebs  ?? Diurese   ?? Will need out pt pft's  ?? Will need repeat chest ct in 1 year  ?? Mobilize ?? Slow progress  ?? Needs PT/OT     Subjective:     No acute events overnight  No acute distress  No acute complaints      Current Facility-Administered Medications   Medication Dose Route Frequency   ??? potassium chloride (K-DUR, KLOR-CON) SR tablet 40 mEq  40 mEq Oral BID   ??? furosemide (LASIX) injection 40 mg  40 mg IntraVENous BID   ??? metoprolol succinate (TOPROL-XL) XL tablet 75 mg  75 mg Oral DAILY   ??? acetylcysteine (MUCOMYST) 200 mg/mL (20 %) solution 600 mg  600 mg Oral BID   ??? enoxaparin (LOVENOX) injection 40 mg  40 mg SubCUTAneous Q12H   ??? pravastatin (PRAVACHOL) tablet 20 mg  20 mg Oral QHS   ??? aspirin delayed-release tablet 81 mg  81 mg Oral DAILY   ??? methocarbamol (ROBAXIN) tablet 500 mg  500 mg Oral QID   ??? umeclidinium-vilanterol (ANORO ELLIPTA) 62.5 mcg- 25 mcg/inhalation  1 Puff Inhalation DAILY   ??? amLODIPine (NORVASC) tablet 5 mg  5 mg Oral DAILY       Review of Systems:  A comprehensive review of systems was negative except for: Respiratory: positive for dyspnea on exertion    Objective:  Vital Signs:    Visit Vitals  BP 147/76 (BP 1 Location: Right arm, BP Patient Position: At rest)   Pulse 86   Temp 98.1 ??F (36.7 ??C)   Resp 20   Ht 5\' 6"  (1.676 m)   Wt 128.8 kg (283 lb 15.2 oz)   SpO2 90%   BMI 45.83 kg/m??       O2 Device: Nasal cannula   O2 Flow Rate (L/min): 4 l/min   Temp (24hrs), Avg:98.2 ??F (36.8 ??C), Min:97.8 ??F (36.6 ??C), Max:98.7 ??F (37.1 ??C)     Intake/Output:   Last shift:      10/18 0701 - 10/18 1900  In: -   Out: 1200 [Urine:1200]   Last 3 shifts: 10/16 1901 - 10/18 0700  In: 820 [P.O.:820]  Out: 640 [Urine:640]    Intake/Output Summary (Last 24 hours) at 03/19/2017 4259  Last data filed at 03/19/2017 0711  Gross per 24 hour   Intake 820 ml   Output 1440 ml   Net -620 ml      Physical Exam:   General:  Alert, cooperative, no distress, appears stated age.   Head:  Normocephalic, without obvious abnormality, atraumatic.   Eyes:  Conjunctivae/corneas clear. PERRL, EOMs intact.   Nose: Nares normal. Septum midline. Mucosa normal. No drainage or sinus tenderness.   Throat: Lips, mucosa, and tongue normal. Teeth and gums normal.   Neck: Supple, symmetrical, trachea midline, no adenopathy, thyroid: no enlargment/tenderness/nodules, no carotid bruit and no JVD.   Back:   Symmetric, no curvature. ROM normal.   Lungs:   Clear to auscultation bilaterally.   Chest wall:  No tenderness or deformity.   Heart:  Regular rate and rhythm, S1, S2 normal, no murmur, click, rub or gallop.   Abdomen:   Soft, non-tender. Bowel sounds normal. No masses,  No organomegaly.   Extremities: Extremities normal, atraumatic, no cyanosis, ++ edema.   Pulses: 2+ and symmetric all extremities.   Skin: Skin color, texture, turgor normal. No rashes or lesions   Lymph nodes: Cervical, supraclavicular, and axillary nodes normal.   Neurologic: Grossly nonfocal     Data review:     Recent Results (from the past 24 hour(s))   GLUCOSE, POC    Collection Time: 03/18/17 11:38 AM   Result Value Ref Range    Glucose (POC) 219 (H) 65 - 100 mg/dL    Performed by ELI MONIQUE    GLUCOSE, POC    Collection Time: 03/18/17  8:48 PM   Result Value Ref Range    Glucose (POC) 155 (H) 65 - 100 mg/dL    Performed by Merilynn Finlandhambers Michael (PCT)    METABOLIC PANEL, BASIC    Collection Time: 03/19/17  4:25 AM   Result Value Ref Range    Sodium 140 136 - 145 mmol/L    Potassium 4.1 3.5 - 5.1 mmol/L    Chloride 98 97 - 108 mmol/L    CO2 36 (H) 21 - 32 mmol/L    Anion gap 6 5 - 15 mmol/L     Glucose 114 (H) 65 - 100 mg/dL    BUN 28 (H) 6 - 20 MG/DL    Creatinine 1.610.83 0.960.55 - 1.02 MG/DL    BUN/Creatinine ratio 34 (H) 12 - 20      GFR est AA >60 >60 ml/min/1.273m2    GFR est non-AA >60 >60 ml/min/1.3573m2    Calcium 7.7 (L) 8.5 - 10.1 MG/DL   GLUCOSE, POC    Collection Time: 03/19/17  7:08 AM   Result Value Ref Range    Glucose (POC) 115 (H) 65 - 100 mg/dL    Performed by Bernardo HeaterPalmore Lindsey (PCT)        Imaging:  I have personally reviewed the patient???s radiographs and have reviewed the reports:

## 2017-03-20 LAB — EKG, 12 LEAD, INITIAL
Atrial Rate: 86 {beats}/min
Calculated P Axis: 56 degrees
Calculated R Axis: 97 degrees
Calculated T Axis: 22 degrees
Diagnosis: NORMAL
P-R Interval: 184 ms
Q-T Interval: 360 ms
QRS Duration: 82 ms
QTC Calculation (Bezet): 430 ms
Ventricular Rate: 86 {beats}/min

## 2017-03-20 LAB — METABOLIC PANEL, BASIC
Anion gap: 4 mmol/L — ABNORMAL LOW (ref 5–15)
BUN/Creatinine ratio: 30 — ABNORMAL HIGH (ref 12–20)
BUN: 24 MG/DL — ABNORMAL HIGH (ref 6–20)
CO2: 36 mmol/L — ABNORMAL HIGH (ref 21–32)
Calcium: 8.1 MG/DL — ABNORMAL LOW (ref 8.5–10.1)
Chloride: 97 mmol/L (ref 97–108)
Creatinine: 0.79 MG/DL (ref 0.55–1.02)
GFR est AA: >60 mL/min/{1.73_m2} (ref >60)
GFR est non-AA: >60 mL/min/{1.73_m2} (ref >60)
Glucose: 108 mg/dL — ABNORMAL HIGH (ref 65–100)
Potassium: 4.1 mmol/L (ref 3.5–5.1)
Sodium: 137 mmol/L (ref 136–145)

## 2017-03-20 LAB — GLUCOSE, POC
Glucose (POC): 153 mg/dL — ABNORMAL HIGH (ref 65–100)
Glucose (POC): 208 mg/dL — ABNORMAL HIGH (ref 65–100)
Glucose (POC): 141 mg/dL — ABNORMAL HIGH (ref 65–100)

## 2017-03-20 MED FILL — TOPROL XL 25 MG TABLET,EXTENDED RELEASE: 25 mg | ORAL | Qty: 1

## 2017-03-20 MED FILL — FUROSEMIDE 10 MG/ML IJ SOLN: 10 mg/mL | INTRAMUSCULAR | Qty: 4

## 2017-03-20 MED FILL — METHOCARBAMOL 500 MG TAB: 500 mg | ORAL | Qty: 1

## 2017-03-20 MED FILL — ACETYLCYSTEINE 20 % (200 MG/ML) SOLN: 200 mg/mL (20 %) | Qty: 4

## 2017-03-20 MED FILL — PRAVASTATIN 10 MG TAB: 10 mg | ORAL | Qty: 2

## 2017-03-20 MED FILL — LOVENOX 40 MG/0.4 ML SUBCUTANEOUS SYRINGE: 40 mg/0.4 mL | SUBCUTANEOUS | Qty: 0.4

## 2017-03-20 MED FILL — ASPIRIN 81 MG TAB, DELAYED RELEASE: 81 mg | ORAL | Qty: 1

## 2017-03-20 MED FILL — HYDROCODONE-ACETAMINOPHEN 5 MG-325 MG TAB: 5-325 mg | ORAL | Qty: 1

## 2017-03-20 MED FILL — AMLODIPINE 5 MG TAB: 5 mg | ORAL | Qty: 1

## 2017-03-20 MED FILL — ACETYLCYSTEINE 20 % (200 MG/ML) SOLN: 200 mg/mL (20 %) | Qty: 3

## 2017-03-20 MED FILL — HEALTHYLAX 17 GRAM ORAL POWDER PACKET: 17 gram | ORAL | Qty: 1

## 2017-03-20 NOTE — Progress Notes (Signed)
Hospitalist Progress Note  NAME: Brittney Shelton   DOB:  05/12/1951   MRN:  [id number]       Assessment / Plan:  Lumbar degenerative disc disese/back pain and Left knee pain  Severe Knee and Back Pain today  XR spine, degenerative changes L3-S1, marked disc space narrowing at L4-L5, facet arthropathy L3-S1  PT/OT  Appreciate ortho help with knee injection   Will ask ortho for reevaluation  PRN norco and tylenol for pain    Acute on chronic hypoxic respiratory failure POA with baseline 4-5L of O2  Due to Acute on chronic diastolic congestive heart failure in settings of moderate Pulmonary HTN (ProBNP with [id number] which is significantly elevated in morbidly obese pt)  COPD at baseline, doubt exacerbation with no significant cough and not wheezing  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 There were no regional wall motion abnormalities, was moderate pulmonary hypertension.  Continue Lasix 40mg  iV BID  Stopped IV steroids earlier admission as pulmonary doesn't believe COPD exacerbation and not improvement on steroids and clinically doesn't seems COPD exacerbation  monitor I/Os, daily weights  Continue Nebs  Complete course of Px Levaquin  Cardiology and pulmonary consult appreciated  Cont home anoro ellipta or formulary equivalent  Off High Flow O2, now on 6L    DM2 - Diet Controlled  Last A1C on record 11/2016 is 5.8. No meds listed PTA  Current hyperglycemia seems related to Steroids  Continue SSI for now   ??  HTN  Cont toprol and amlodipine   ??  HLD  Cont home pravastatin  ??  Code Status:   Surrogate Decision Maker: Justine NullShannon Boomer [phone]  ??  DVT Prophylaxis: sq heparin  GI Prophylaxis: not indicated  ??  Baseline: lives at home with family     Subjective: Pt seen and examined at bedside. Complaining of severe Right knee and back pain. Overnight events d/w RN     Chief Complaint / Reason for Physician Visit: f/u "Respiratory Failure"    Review of Systems:   Symptom Y/N Comments  Symptom Y/N Comments   Fever/Chills n   Chest Pain n    Poor Appetite    Edema     Cough n   Abdominal Pain n    Sputum    Joint Pain y Knee and back pain   SOB/DOE y   Pruritis/Rash     Nausea/vomit    Tolerating PT/OT     Diarrhea    Tolerating Diet     Constipation    Other       Could NOT obtain due to:      Objective:     VITALS:   Last 24hrs VS reviewed since prior progress note. Most recent are:  Patient Vitals for the past 24 hrs:   Temp Pulse Resp BP SpO2   03/20/17 1044 98 ??F (36.7 ??C) (!) 105 18 149/66 100 %   03/20/17 1036 ??? ??? ??? ??? 93 %   03/20/17 0725 98.7 ??F (37.1 ??C) ??? 19 ??? ???   03/20/17 0306 98.8 ??F (37.1 ??C) 92 ??? 119/58 91 %   03/20/17 0000 98.7 ??F (37.1 ??C) 93 ??? ??? 93 %   03/19/17 2000 98.9 ??F (37.2 ??C) ??? ??? 118/63 ???   03/19/17 1929 ??? 90 ??? 118/63 94 %   03/19/17 1718 ??? ??? ??? ??? 92 %   03/19/17 1509 98.1 ??F (36.7 ??C) 93 20 123/61 (!) 89 %  Intake/Output Summary (Last 24 hours) at 03/20/2017 1144  Last data filed at 03/20/2017 0730  Gross per 24 hour   Intake 1090 ml   Output 1200 ml   Net -110 ml        PHYSICAL EXAM:  General: WD, WN. Alert, cooperative, no acute distress????  EENT:  EOMI. Anicteric sclerae. MMM  Resp:  BL basilar crackle, no wheezing or rales.  No accessory muscle use  CV:  Regular  rhythm,?? No edema  GI:  Soft, Non distended, Non tender. ??+Bowel sounds  Neurologic:?? Alert and oriented X 3, normal speech, at baseline  Psych:???? Good insight.??Not anxious nor agitated  Skin:  No rashes.  No jaundice    Reviewed most current lab test results and cultures  YES  Reviewed most current radiology test results   YES  Review and summation of old records today    NO  Reviewed patient's current orders and MAR    YES  PMH/SH reviewed - no change compared to H&P  ________________________________________________________________________  Care Plan discussed with:    Comments   Patient x    Family      RN x    Care Manager x    Consultant                         Multidiciplinary team rounds were held today with case manager, nursing, pharmacist and Higher education careers adviser.  Patient's plan of care was discussed; medications were reviewed and discharge planning was addressed.     ________________________________________________________________________  Total NON critical care TIME: 25 Minutes    Total CRITICAL CARE TIME Spent:   Minutes non procedure based      Comments   >50% of visit spent in counseling and coordination of care     ________________________________________________________________________  Jamesetta So, MD     Procedures: see electronic medical records for all procedures/Xrays and details which were not copied into this note but were reviewed prior to creation of Plan.      LABS:  I reviewed today's most current labs and imaging studies.  Pertinent labs include:  No results for input(s): WBC, HGB, HCT, PLT, HGBEXT, HCTEXT, PLTEXT, HGBEXT, HCTEXT, PLTEXT in the last 72 hours.  Recent Labs     03/20/17  0431 03/19/17  0425 03/18/17  0302   NA 137 140 138   K 4.1 4.1 3.4*   CL 97 98 96*   CO2 36* 36* 38*   GLU 108* 114* 113*   BUN 24* 28* 33*   CREA 0.79 0.83 0.90   CA 8.1* 7.7* 7.5*       Signed: Jamesetta So, MD

## 2017-03-20 NOTE — Progress Notes (Signed)
Spoke with patient this morning and she states she talked with her niece last night and she would like to look at going to [facility] when medically stable.  Referral sent to [facility] and will await their response. Spoke with Dr Alanson AlyAnis and made him aware and he was in agreement.

## 2017-03-20 NOTE — Progress Notes (Signed)
Physical Therapy Goals  Updated 03/19/2017  1. Patient will move from supine to sit and sit to supine in bed with modified independence within 7 day(s).   2. Patient will transfer from bed to chair and chair to bed with modified independence using the least restrictive device within 7 day(s).  3. Patient will perform sit to stand with modified independence within 7 day(s).  4. Patient will ambulate with supervision/set-up for 100 feet with the least restrictive device within 7 day(s).   5. Patient will ascend/descend 3 stairs with 1 handrail(s) with minimal assistance within 7 day(s)    Physical Therapy Goals  Initiated 03/12/2017  1. Patient will move from supine to sit and sit to supine in bed with modified independence within 7 day(s).   2. Patient will transfer from bed to chair and chair to bed with modified independence using the least restrictive device within 7 day(s).  3. Patient will perform sit to stand with modified independence within 7 day(s).  4. Patient will ambulate with supervision/set-up for 50 feet with the least restrictive device within 7 day(s).   5. Patient will ascend/descend 3 stairs with 1 handrail(s) with minimal assistance/contact guard assist within 7 day(s).     physical Therapy TREATMENT  Patient: Brittney Shelton [phone](66 y.o. female)  Date: 03/20/2017  Diagnosis: Acute respiratory failure with hypoxia (HCC) <principal problem not specified>      Precautions: Fall, DNR  Chart, physical therapy assessment, plan of care and goals were reviewed.    ASSESSMENT:  Pt presented sitting partially at EOB with HOB elevated. RN requesting assistance in attempting to perform sit/stand/pivot transfer to chair. Pt with increased complaint of back pain. Pt requiring maxA x2 to scoot hips. When attempting to initiate sit/stand transfer pt with increased posterior trunk lean, causing her hips to begin sliding forward. To prevent pt from sliding off EOB, maxAx 2 sit to supine transfer was performed. Pt with  increased complaint of back pain, becoming tearful. Pt then positioned in bed with maxA x3 in dependent trendelenburg position. Spoke to RN about possibility of beginning regularly scheduled pain medication regiment as pt has been inconsistent in requesting the prn meds which may be contributing to her limitations in activity tolerance.     At discharge it is recommended that pt attend a SNF for continued PT to address her impairments including improving her strength and activity tolerance.   Progression toward goals:  []     Improving appropriately and progressing toward goals  []     Improving slowly and progressing toward goals  [x]     Not making progress toward goals and plan of care will be adjusted     PLAN:  Patient continues to benefit from skilled intervention to address the above impairments.  Continue treatment per established plan of care.  Discharge Recommendations:  Skilled Nursing Facility  Further Equipment Recommendations for Discharge:  Defer to SNF     SUBJECTIVE:   Patient stated ???Oh Jesus my back!???    OBJECTIVE DATA SUMMARY:   Critical Behavior:  Neurologic State: Alert  Orientation Level: Oriented X4  Cognition: Follows commands  Safety/Judgement: Awareness of environment, Fall prevention, Home safety  Functional Mobility Training:  Bed Mobility:     Supine to Sit: (Pt receivied sittiing partially EOB)  Sit to Supine: Maximum assistance;Assist x2  Scooting: Maximum assistance;Assist x2                    Activity Tolerance:   Pt with  poor activity tolerance, pt unable to provide much assistance during transfer due to increased back pain  Please refer to the flowsheet for vital signs taken during this treatment.  After treatment:   []     Patient left in no apparent distress sitting up in chair  [x]     Patient left in no apparent distress in bed  [x]     Call bell left within reach  [x]     Nursing notified  []     Caregiver present  [x]     Bed alarm activated    COMMUNICATION/COLLABORATION:    The patient???s plan of care was discussed with: Occupational Therapist and Registered Nurse    [name], SPT   Time Calculation: 12 mins    Regarding student involvement in patient care:  A student participated in this treatment session. Per CMS Medicare statements and APTA guidelines I certify that the following was true:  1. I was present and directly observed the entire session.  2. I made all skilled judgments and clinical decisions regarding care.  3. I am the practitioner responsible for assessment, treatment, and documentation.

## 2017-03-20 NOTE — Progress Notes (Signed)
Bedside shift change report given to Mallory (oncoming nurse) by Matthew (offgoing nurse). Report included the following information SBAR, Kardex and ED Summary.

## 2017-03-20 NOTE — Progress Notes (Signed)
[facility]    Name: Brittney LimesMary S Shelton MRN: [id number]   DOB: 1950-12-18 [facility]   Date: 03/20/2017        IMPRESSION:   ?? Acute respiratory failure  ?? OHS  ?? OSA  ?? Pulmonary HTN  ?? Pulmonary nodules      RECOMMENDATIONS:   ?? Wean O2  ?? Jet nebs  ?? Diurese   ?? Will need out pt pft's  ?? Will need repeat chest ct in 1 year  ?? Mobilize ?? Slow progress  ?? PT/OT  ?? Will f/u monday     Subjective:     No acute events overnight  No acute distress  No acute complaints      Current Facility-Administered Medications   Medication Dose Route Frequency   ??? polyethylene glycol (MIRALAX) packet 17 g  17 g Oral DAILY   ??? furosemide (LASIX) injection 40 mg  40 mg IntraVENous BID   ??? metoprolol succinate (TOPROL-XL) XL tablet 75 mg  75 mg Oral DAILY   ??? acetylcysteine (MUCOMYST) 200 mg/mL (20 %) solution 600 mg  600 mg Oral BID   ??? enoxaparin (LOVENOX) injection 40 mg  40 mg SubCUTAneous Q12H   ??? pravastatin (PRAVACHOL) tablet 20 mg  20 mg Oral QHS   ??? aspirin delayed-release tablet 81 mg  81 mg Oral DAILY   ??? methocarbamol (ROBAXIN) tablet 500 mg  500 mg Oral QID   ??? umeclidinium-vilanterol (ANORO ELLIPTA) 62.5 mcg- 25 mcg/inhalation  1 Puff Inhalation DAILY   ??? amLODIPine (NORVASC) tablet 5 mg  5 mg Oral DAILY       Review of Systems:  A comprehensive review of systems was negative except for: Respiratory: positive for dyspnea on exertion    Objective:  Vital Signs:    Visit Vitals  BP 119/58   Pulse 92   Temp 98.7 ??F (37.1 ??C)   Resp 19   Ht 5\' 6"  (1.676 m)   Wt 128.8 kg (284 lb)   SpO2 91%   BMI 45.84 kg/m??       O2 Device: Non-invasive cannula, Ventimask   O2 Flow Rate (L/min): 4 l/min   Temp (24hrs), Avg:98.6 ??F (37 ??C), Min:98.1 ??F (36.7 ??C), Max:98.9 ??F (37.2 ??C)     Intake/Output:   Last shift:      10/19 0701 - 10/19 1900  In: -   Out: 1200 [Urine:1200]  Last 3 shifts: 10/17 1901 - 10/19 0700  In: 1090 [P.O.:1090]   Out: 1200 [Urine:1200]    Intake/Output Summary (Last 24 hours) at 03/20/2017 0915  Last data filed at 03/20/2017 0730  Gross per 24 hour   Intake 1090 ml   Output 1200 ml   Net -110 ml      Physical Exam:   General:  Alert, cooperative, no distress, appears stated age.   Head:  Normocephalic, without obvious abnormality, atraumatic.   Eyes:  Conjunctivae/corneas clear. PERRL, EOMs intact.   Nose: Nares normal. Septum midline. Mucosa normal. No drainage or sinus tenderness.   Throat: Lips, mucosa, and tongue normal. Teeth and gums normal.   Neck: Supple, symmetrical, trachea midline, no adenopathy, thyroid: no enlargment/tenderness/nodules, no carotid bruit and no JVD.   Back:   Symmetric, no curvature. ROM normal.   Lungs:   Clear to auscultation bilaterally.   Chest wall:  No tenderness or deformity.   Heart:  Regular rate and rhythm, S1, S2 normal, no murmur,  click, rub or gallop.   Abdomen:   Soft, non-tender. Bowel sounds normal. No masses,  No organomegaly.   Extremities: Extremities normal, atraumatic, no cyanosis, ++ edema.   Pulses: 2+ and symmetric all extremities.   Skin: Skin color, texture, turgor normal. No rashes or lesions   Lymph nodes: Cervical, supraclavicular, and axillary nodes normal.   Neurologic: Grossly nonfocal     Data review:     Recent Results (from the past 24 hour(s))   GLUCOSE, POC    Collection Time: 03/19/17 11:35 AM   Result Value Ref Range    Glucose (POC) 207 (H) 65 - 100 mg/dL    Performed by Bernardo Heater (PCT)    GLUCOSE, POC    Collection Time: 03/19/17  4:50 PM   Result Value Ref Range    Glucose (POC) 167 (H) 65 - 100 mg/dL    Performed by Bernardo Heater (PCT)    EKG, 12 LEAD, INITIAL    Collection Time: 03/19/17  9:47 PM   Result Value Ref Range    Ventricular Rate 86 BPM    Atrial Rate 86 BPM    P-R Interval 184 ms    QRS Duration 82 ms    Q-T Interval 360 ms    QTC Calculation (Bezet) 430 ms    Calculated P Axis 56 degrees    Calculated R Axis 97 degrees     Calculated T Axis 22 degrees    Diagnosis       Normal sinus rhythm  Right atrial enlargement  Right ventricular hypertrophy with repolarization abnormality  When compared with ECG of 12-Mar-2017 09:56,  Sinus rhythm has replaced Atrial fibrillation  Criteria for Anterior infarct are no longer present     METABOLIC PANEL, BASIC    Collection Time: 03/20/17  4:31 AM   Result Value Ref Range    Sodium 137 136 - 145 mmol/L    Potassium 4.1 3.5 - 5.1 mmol/L    Chloride 97 97 - 108 mmol/L    CO2 36 (H) 21 - 32 mmol/L    Anion gap 4 (L) 5 - 15 mmol/L    Glucose 108 (H) 65 - 100 mg/dL    BUN 24 (H) 6 - 20 MG/DL    Creatinine 1.61 0.96 - 1.02 MG/DL    BUN/Creatinine ratio 30 (H) 12 - 20      GFR est AA >60 >60 ml/min/1.67m2    GFR est non-AA >60 >60 ml/min/1.92m2    Calcium 8.1 (L) 8.5 - 10.1 MG/DL   GLUCOSE, POC    Collection Time: 03/20/17  7:10 AM   Result Value Ref Range    Glucose (POC) 153 (H) 65 - 100 mg/dL    Performed by Bernardo Heater (PCT)        Imaging:  I have personally reviewed the patient???s radiographs and have reviewed the reports:

## 2017-03-20 NOTE — Progress Notes (Signed)
[address], [place], [place] [id number]  [phone]  Cardiology Progress Note      03/20/2017 1020AM    Admit Date: 03/11/2017    Admit Diagnosis:   Acute respiratory failure with hypoxia (HCC)    Subjective:     Brittney Shelton is a 66 y.o. female with PMH COPD, HF, HTN, DM who was admitted for Acute respiratory failure with hypoxia (HCC).    Overnight events:  -VSS on nasal cannula  -labs steady  -weight up 1#; I/o shows neg 100ml with supposed net 10.8L neg  -Brittney Shelton states she is not feeling well today.  She is having a lot of back pain and R knee pain.       Visit Vitals  BP 149/66   Pulse (!) 105   Temp 98 ??F (36.7 ??C)   Resp 18   Ht 5\' 6"  (1.676 m)   Wt 128.8 kg (284 lb)   SpO2 100%   BMI 45.84 kg/m??       Current Facility-Administered Medications   Medication Dose Route Frequency   ??? polyethylene glycol (MIRALAX) packet 17 g  17 g Oral DAILY   ??? docusate sodium (COLACE) capsule 100 mg  100 mg Oral DAILY PRN   ??? albuterol-ipratropium (DUO-NEB) 2.5 MG-0.5 MG/3 ML  3 mL Nebulization Q6H PRN   ??? furosemide (LASIX) injection 40 mg  40 mg IntraVENous BID   ??? metoprolol succinate (TOPROL-XL) XL tablet 75 mg  75 mg Oral DAILY   ??? acetylcysteine (MUCOMYST) 200 mg/mL (20 %) solution 600 mg  600 mg Oral BID   ??? enoxaparin (LOVENOX) injection 40 mg  40 mg SubCUTAneous Q12H   ??? acetaminophen (TYLENOL) tablet 650 mg  650 mg Oral Q6H PRN   ??? HYDROcodone-acetaminophen (NORCO) 5-325 mg per tablet 1 Tab  1 Tab Oral Q6H PRN   ??? pravastatin (PRAVACHOL) tablet 20 mg  20 mg Oral QHS   ??? aspirin delayed-release tablet 81 mg  81 mg Oral DAILY   ??? methocarbamol (ROBAXIN) tablet 500 mg  500 mg Oral QID   ??? umeclidinium-vilanterol (ANORO ELLIPTA) 62.5 mcg- 25 mcg/inhalation  1 Puff Inhalation DAILY   ??? amLODIPine (NORVASC) tablet 5 mg  5 mg Oral DAILY       Objective:      Physical Exam:  General: pleasant, obese AAF resting in bed grimacing from pain  Heart: RRR, split s2   Lungs: clear/dim    Abdomen: Soft, +BS, NTND   Extremities: LE bil +DP/PT, ? Lymphedema vs obesity.  Chronic appearance and difficult to determine if any edema.   Neurologic: Grossly normal  Skin:?? Warm and dry.??    Data Review:   No results for input(s): WBC, HGB, HCT, PLT, HGBEXT, HCTEXT, PLTEXT, HGBEXT, HCTEXT, PLTEXT in the last 72 hours.  Recent Labs     03/20/17  0431 03/19/17  0425 03/18/17  0302   NA 137 140 138   K 4.1 4.1 3.4*   CL 97 98 96*   CO2 36* 36* 38*   GLU 108* 114* 113*   BUN 24* 28* 33*   CREA 0.79 0.83 0.90   CA 8.1* 7.7* 7.5*       No results for input(s): TROIQ, CPK, CKMB in the last 72 hours.      Intake/Output Summary (Last 24 hours) at 03/20/2017 1326  Last data filed at 03/20/2017 0730  Gross per 24 hour   Intake 1090 ml   Output 1200 ml  Net -110 ml        Telemetry:  SR 1st degree; burst of NSVT vs PAF  ECG: read as afib (tele review did not show a fib)    Echocardiogram: EF 60-65%; NWMA; mild TR; mod PHTN   CXRAY: "Study is compromised by patient's body habitus. There is question of a vague patchy density at the right lung base. PA and lateral views are recommended when the patient is stable for further assessment."  CTA chest: "No evidence of acute pulmonary embolus. Unchanged enlargement of the main pulmonary artery, compatible with pulmonary hypertension. Unchanged mediastinal lymphadenopathy. Bilateral indeterminate, ill-defined pulmonary nodules, measuring up to 5 mm; consider follow-up CT in 12 months to assure stability or  resolution. 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 CTA neg for  PE.  Troponin neg.  proBNP [id number].  Patient has felt at her baseline.  COPD history.  ECHO shows pulmonary hypertension  ?? Remains on IV lasix.  Weights have been variable and relatively stable.  Patient has felt the same respiratory-wise since admission.    ?? Pulmonary following         HTN:  BP stable   ?? Multiple home meds still on hold  ?? Continue lower dose of amlodipine and current dose of toprol   ?? May need titration of meds when IV lasix stopped         [name], NP  DNP, RN, AGACNP-BC

## 2017-03-20 NOTE — Progress Notes (Signed)
Attempted to see pt for OT services.  Pt had just returned to supine with PT at bedside.  Pt was in severe back pain and unable to participate fully with PT.  Assisted with scooting pt up HOB in trendelenburg dependently. Recommend Pulmonary Rehab or SNF for rehab at discharge.

## 2017-03-21 ENCOUNTER — Inpatient Hospital Stay: Admit: 2017-03-21 | Payer: MEDICARE | Primary: Internal Medicine

## 2017-03-21 LAB — METABOLIC PANEL, BASIC
Anion gap: 8 mmol/L (ref 5–15)
BUN/Creatinine ratio: 24 — ABNORMAL HIGH (ref 12–20)
BUN: 21 MG/DL — ABNORMAL HIGH (ref 6–20)
CO2: 36 mmol/L — ABNORMAL HIGH (ref 21–32)
Calcium: 8.5 MG/DL (ref 8.5–10.1)
Chloride: 93 mmol/L — ABNORMAL LOW (ref 97–108)
Creatinine: 0.86 MG/DL (ref 0.55–1.02)
GFR est AA: >60 mL/min/{1.73_m2} (ref >60)
GFR est non-AA: >60 mL/min/{1.73_m2} (ref >60)
Glucose: 137 mg/dL — ABNORMAL HIGH (ref 65–100)
Potassium: 3.8 mmol/L (ref 3.5–5.1)
Sodium: 137 mmol/L (ref 136–145)

## 2017-03-21 LAB — GLUCOSE, POC
Glucose (POC): 129 mg/dL — ABNORMAL HIGH (ref 65–100)
Glucose (POC): 194 mg/dL — ABNORMAL HIGH (ref 65–100)
Glucose (POC): 155 mg/dL — ABNORMAL HIGH (ref 65–100)

## 2017-03-21 MED FILL — METHOCARBAMOL 500 MG TAB: 500 mg | ORAL | Qty: 1

## 2017-03-21 MED FILL — FUROSEMIDE 10 MG/ML IJ SOLN: 10 mg/mL | INTRAMUSCULAR | Qty: 4

## 2017-03-21 MED FILL — HEALTHYLAX 17 GRAM ORAL POWDER PACKET: 17 gram | ORAL | Qty: 1

## 2017-03-21 MED FILL — HYDROCODONE-ACETAMINOPHEN 5 MG-325 MG TAB: 5-325 mg | ORAL | Qty: 1

## 2017-03-21 MED FILL — AMLODIPINE 5 MG TAB: 5 mg | ORAL | Qty: 1

## 2017-03-21 MED FILL — ACETYLCYSTEINE 20 % (200 MG/ML) SOLN: 200 mg/mL (20 %) | Qty: 4

## 2017-03-21 MED FILL — TOPROL XL 25 MG TABLET,EXTENDED RELEASE: 25 mg | ORAL | Qty: 1

## 2017-03-21 MED FILL — LOVENOX 40 MG/0.4 ML SUBCUTANEOUS SYRINGE: 40 mg/0.4 mL | SUBCUTANEOUS | Qty: 0.4

## 2017-03-21 MED FILL — PRAVASTATIN 10 MG TAB: 10 mg | ORAL | Qty: 2

## 2017-03-21 MED FILL — ASPIRIN 81 MG TAB, DELAYED RELEASE: 81 mg | ORAL | Qty: 1

## 2017-03-21 NOTE — Consults (Signed)
Pt admitted to medicine. Had left knee pain and injection recently now with similar right knee pain. Denies injury or fevers.    Patient Vitals for the past 24 hrs:   Temp Pulse Resp BP SpO2   03/21/17 0730 98 ??F (36.7 ??C) ??? 18 ??? ???   03/21/17 0417 98 ??F (36.7 ??C) (!) 102 20 133/61 92 %   03/20/17 2354 99.1 ??F (37.3 ??C) 99 18 100/67 94 %   03/20/17 1941 99.9 ??F (37.7 ??C) 99 20 131/58 92 %   03/20/17 1055 97.9 ??F (36.6 ??C) (!) 106 18 149/66 96 %   03/20/17 1044 98 ??F (36.7 ??C) (!) 105 18 149/66 100 %   03/20/17 1036 ??? ??? ??? ??? 93 %     Knee 0-10 rom with pain. TTP BLJL  +PPP    Xray pending  Inject planned after xray

## 2017-03-21 NOTE — Progress Notes (Signed)
Cardiology Progress Note      03/21/2017 12:24 PM    Admit Date: 03/11/2017    Admit Diagnosis: Acute respiratory failure with hypoxia (HCC)      Subjective:     Brittney Shelton has no specific complaints. Remains SOB.    Visit Vitals  BP 146/63 (BP 1 Location: Right arm, BP Patient Position: At rest)   Pulse 96   Temp 98.2 ??F (36.8 ??C)   Resp 18   Ht 5\' 6"  (1.676 m)   Wt 279 lb 1.6 oz (126.6 kg)   SpO2 92%   BMI 45.05 kg/m??       Current Facility-Administered Medications   Medication Dose Route Frequency   ??? polyethylene glycol (MIRALAX) packet 17 g  17 g Oral DAILY   ??? docusate sodium (COLACE) capsule 100 mg  100 mg Oral DAILY PRN   ??? albuterol-ipratropium (DUO-NEB) 2.5 MG-0.5 MG/3 ML  3 mL Nebulization Q6H PRN   ??? furosemide (LASIX) injection 40 mg  40 mg IntraVENous BID   ??? metoprolol succinate (TOPROL-XL) XL tablet 75 mg  75 mg Oral DAILY   ??? acetylcysteine (MUCOMYST) 200 mg/mL (20 %) solution 600 mg  600 mg Oral BID   ??? enoxaparin (LOVENOX) injection 40 mg  40 mg SubCUTAneous Q12H   ??? acetaminophen (TYLENOL) tablet 650 mg  650 mg Oral Q6H PRN   ??? HYDROcodone-acetaminophen (NORCO) 5-325 mg per tablet 1 Tab  1 Tab Oral Q6H PRN   ??? pravastatin (PRAVACHOL) tablet 20 mg  20 mg Oral QHS   ??? aspirin delayed-release tablet 81 mg  81 mg Oral DAILY   ??? methocarbamol (ROBAXIN) tablet 500 mg  500 mg Oral QID   ??? umeclidinium-vilanterol (ANORO ELLIPTA) 62.5 mcg- 25 mcg/inhalation  1 Puff Inhalation DAILY   ??? amLODIPine (NORVASC) tablet 5 mg  5 mg Oral DAILY         Objective:      Physical Exam:  Visit Vitals  BP 146/63 (BP 1 Location: Right arm, BP Patient Position: At rest)   Pulse 96   Temp 98.2 ??F (36.8 ??C)   Resp 18   Ht 5\' 6"  (1.676 m)   Wt 279 lb 1.6 oz (126.6 kg)   SpO2 92%   BMI 45.05 kg/m??     General Appearance:  Well developed, well nourished,alert and oriented x 3, and individual in no acute distress.   Ears/Nose/Mouth/Throat:   Hearing grossly normal.         Neck: Supple.    Chest:   Lungs clear to auscultation bilaterally.   Cardiovascular:  Regular rate and rhythm, S1, S2 normal, no murmur.   Abdomen:   Soft, non-tender, bowel sounds are active.   Extremities: No edema bilaterally.    Skin: Warm and dry.               Data Review:   Labs:    Recent Results (from the past 24 hour(s))   GLUCOSE, POC    Collection Time: 03/20/17  4:14 PM   Result Value Ref Range    Glucose (POC) 141 (H) 65 - 100 mg/dL    Performed by LOW[facility] COURTNEY (PCT) CON    METABOLIC PANEL, BASIC    Collection Time: 03/21/17  4:25 AM   Result Value Ref Range    Sodium 137 136 - 145 mmol/L    Potassium 3.8 3.5 - 5.1 mmol/L    Chloride 93 (L) 97 - 108 mmol/L    CO2  36 (H) 21 - 32 mmol/L    Anion gap 8 5 - 15 mmol/L    Glucose 137 (H) 65 - 100 mg/dL    BUN 21 (H) 6 - 20 MG/DL    Creatinine 0.450.86 4.090.55 - 1.02 MG/DL    BUN/Creatinine ratio 24 (H) 12 - 20      GFR est AA >60 >60 ml/min/1.5773m2    GFR est non-AA >60 >60 ml/min/1.8373m2    Calcium 8.5 8.5 - 10.1 MG/DL   GLUCOSE, POC    Collection Time: 03/21/17  7:36 AM   Result Value Ref Range    Glucose (POC) 129 (H) 65 - 100 mg/dL    Performed by Buford DresserForbes Nicolette    GLUCOSE, POC    Collection Time: 03/21/17 10:59 AM   Result Value Ref Range    Glucose (POC) 194 (H) 65 - 100 mg/dL    Performed by Buford DresserForbes Nicolette        Telemetry: normal sinus rhythm      Assessment:     Active Problems:    Hypercholesterolemia (09/29/2014)      COPD (chronic obstructive pulmonary disease) (HCC) (07/26/2015)      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    [place] Beckie Saltsavindra V, MD

## 2017-03-21 NOTE — Progress Notes (Signed)
Hospitalist Progress Note  NAME: Brittney Shelton   DOB:  1951/05/14   MRN:  [id number]       Assessment / Plan:  Lumbar degenerative disc disese/back pain and Left knee pain  Severe Knee and Back Pain today  XR spine, degenerative changes L3-S1, marked disc space narrowing at L4-L5, facet arthropathy L3-S1  PT/OT  Appreciate ortho help with knee injection   Ortho again on the case. 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 There were no regional wall motion abnormalities, was moderate pulmonary hypertension.  Continue Lasix 40mg  iV BID  Stopped IV steroids earlier admission as pulmonary doesn't believe COPD exacerbation and not improvement on steroids and clinically doesn't seems COPD exacerbation  monitor I/Os, daily weights  Continue Nebs  Complete course of Px Levaquin  Cardiology and pulmonary consult appreciated  Cont home anoro ellipta or formulary equivalent  Off High Flow O2, now on 6L O2 along with Ventimask  Palliative care was involved, pt decided DNR. Will need to re involve them again if pt doesn't improve    DM2 - Diet Controlled  Last A1C on record 11/2016 is 5.8. No meds listed PTA  Current hyperglycemia seems related to Steroids  Continue SSI for now   ??  HTN  Cont toprol and amlodipine   ??  HLD  Cont home pravastatin  ??  Code Status:   Surrogate Decision Maker: Brittney Shelton [phone]  ??  DVT Prophylaxis: sq heparin  GI Prophylaxis: not indicated  ??  Baseline: lives at home with family      Subjective: Pt seen and examined at bedside. Complaining of severe right knee and back pain. Overnight events d/w RN     Chief Complaint / Reason for Physician Visit: f/u "Respiratory Failure"    Review of Systems:  Symptom Y/N Comments  Symptom Y/N Comments   Fever/Chills n   Chest Pain n    Poor Appetite    Edema     Cough n   Abdominal Pain n    Sputum    Joint Pain y Knee and back pain   SOB/DOE y   Pruritis/Rash     Nausea/vomit    Tolerating PT/OT     Diarrhea    Tolerating Diet     Constipation    Other       Could NOT obtain due to:      Objective:     VITALS:   Last 24hrs VS reviewed since prior progress note. Most recent are:  Patient Vitals for the past 24 hrs:   Temp Pulse Resp BP SpO2   03/21/17 1053 98.2 ??F (36.8 ??C) 96 18 146/63 ???   03/21/17 0730 98 ??F (36.7 ??C) ??? 18 ??? ???   03/21/17 0417 98 ??F (36.7 ??C) (!) 102 20 133/61 92 %   03/20/17 2354 99.1 ??F (37.3 ??C) 99 18 100/67 94 %   03/20/17 1941 99.9 ??F (37.7 ??C) 99 20 131/58 92 %  Intake/Output Summary (Last 24 hours) at 03/21/2017 1339  Last data filed at 03/21/2017 1052  Gross per 24 hour   Intake ???   Output 1700 ml   Net -1700 ml        PHYSICAL EXAM:  General: WD, WN. Alert, cooperative, no acute distress????  EENT:  EOMI. Anicteric sclerae. MMM  Resp:  BL basilar crackle, no wheezing or rales.  No accessory muscle use  CV:  Regular  rhythm,?? No edema  GI:  Soft, Non distended, Non tender. ??+Bowel sounds  Neurologic:?? Alert and oriented X 3, normal speech, at baseline  Psych:???? Good insight.??Not anxious nor agitated  Skin:  No rashes.  No jaundice    Reviewed most current lab test results and cultures  YES  Reviewed most current radiology test results   YES  Review and summation of old records today    NO  Reviewed patient's current orders and MAR    YES  PMH/SH reviewed - no change compared to H&P  ________________________________________________________________________  Care Plan discussed with:    Comments   Patient x    Family      RN x     Care Manager     Consultant                        Multidiciplinary team rounds were held today with case manager, nursing, pharmacist and Higher education careers adviser.  Patient's plan of care was discussed; medications were reviewed and discharge planning was addressed.     ________________________________________________________________________  Total NON critical care TIME: 25 Minutes    Total CRITICAL CARE TIME Spent:   Minutes non procedure based      Comments   >50% of visit spent in counseling and coordination of care     ________________________________________________________________________  Jamesetta So, MD     Procedures: see electronic medical records for all procedures/Xrays and details which were not copied into this note but were reviewed prior to creation of Plan.      LABS:  I reviewed today's most current labs and imaging studies.  Pertinent labs include:  No results for input(s): WBC, HGB, HCT, PLT, HGBEXT, HCTEXT, PLTEXT, HGBEXT, HCTEXT, PLTEXT in the last 72 hours.  Recent Labs     03/21/17  0425 03/20/17  0431 03/19/17  0425   NA 137 137 140   K 3.8 4.1 4.1   CL 93* 97 98   CO2 36* 36* 36*   GLU 137* 108* 114*   BUN 21* 24* 28*   CREA 0.86 0.79 0.83   CA 8.5 8.1* 7.7*       Signed: Jamesetta So, MD

## 2017-03-21 NOTE — Consults (Signed)
CC: RIght knee pain    This is a NEW consultation for new onset of right knee pain. This is unrelated to prior consultation for left knee pain and is a temporally separated complaint.    HPI: 66 y.o. female with history of new onset of right knee pain. She has been admitted to the hospital for respiratory failure. She reports a baseline chronic knee pain which has worsened over the last 1 - 2 days. She has been immobile due to medical comorbidities. She denies falls. No fevers or chills. She received a steroid injection into the left knee on 10/15 and states improvement.      ROS:  Positive per HPI, otherwise negative.    PMHX:  Past Medical History:   Diagnosis Date   ??? Chronic obstructive pulmonary disease (HCC)    ??? Congestive heart failure (HCC)    ??? Diabetes (HCC)    ??? Hypertension        PSHX  History reviewed. No pertinent surgical history.    ALLERGIES:  Allergies   Allergen Reactions   ??? Lisinopril Angioedema       MEDS  No current facility-administered medications on file prior to encounter.      Current Outpatient Medications on File Prior to Encounter   Medication Sig Dispense Refill   ??? furosemide (LASIX) 40 mg tablet Take 40 mg by mouth three (3) times daily.     ??? umeclidinium-vilanterol (ANORO ELLIPTA) 62.5-25 mcg/actuation inhaler Take 1 Puff by inhalation as needed.     ??? lisinopril (PRINIVIL, ZESTRIL) 40 mg tablet Take 40 mg by mouth daily.     ??? metoprolol succinate (TOPROL-XL) 200 mg XL tablet TAKE 1 TABLET BY MOUTH ONCE DAILY FOR HIGH BLOOD PRESSURE 90 Tab 0   ??? pravastatin (PRAVACHOL) 20 mg tablet TAKE 1 TABLET BY MOUTH DAILY AT NIGHT 90 Tab 0   ??? amLODIPine (NORVASC) 10 mg tablet TAKE 1 TABLET BY MOUTH DAILY 90 Tab 1   ??? aspirin delayed-release 81 mg tablet Take 81 mg by mouth daily.     ??? isosorbide dinitrate (ISORDIL) 30 mg tablet TAKE 1 TABLET BY MOUTH THREE TIMES DAILY 270 Tab 3   ??? cloNIDine HCl (CATAPRES) 0.1 mg tablet TAKE 1 TABLET BY MOUTH ONCE DAILY 90 Tab 0    ??? hydrALAZINE (APRESOLINE) 10 mg tablet Take 1 Tab by mouth three (3) times daily. 90 Tab 0   ??? naproxen sodium (ALEVE) 220 mg tablet Take 440 mg by mouth every twelve (12) hours as needed for Pain.         SOC HX  Social History     Socioeconomic History   ??? Marital status: SINGLE     Spouse name: Not on file   ??? Number of children: Not on file   ??? Years of education: Not on file   ??? Highest education level: Not on file   Social Needs   ??? Financial resource strain: Not on file   ??? Food insecurity - worry: Not on file   ??? Food insecurity - inability: Not on file   ??? Transportation needs - medical: Not on file   ??? Transportation needs - non-medical: Not on file   Occupational History   ??? Not on file   Tobacco Use   ??? Smoking status: Never Smoker   ??? Smokeless tobacco: Never Used   Substance and Sexual Activity   ??? Alcohol use: No     Alcohol/week: 0.0 oz   ???  Drug use: No   ??? Sexual activity: No   Other Topics Concern   ??? Not on file   Social History Narrative   ??? Not on file       PE:  Visit Vitals  BP 133/61   Pulse (!) 102   Temp 98 ??F (36.7 ??C)   Resp 18   Ht 5\' 6"  (1.676 m)   Wt 126.6 kg (279 lb 1.6 oz)   SpO2 92%   BMI 45.05 kg/m??       A&Ox3  Normocephalic, atraumatic  Trachea midline  Morbidly obese  NRRR  Abdomen soft/NT    Significant adipose tissue of lower extremity  No warmth or erythema at R knee  No ulcerations or skin lesions  Pain with large arc ROM  Diffuse TTP at knee joint line    IMAGING:   R knee plain films pending    A/P: R knee pain; likely osteoarthritis, no signs of septic arthritis  - Patient with significant medical comorbidities including morbi obesity, deconditioning, acute respiratory decompensation  - possible knee steroid injection once radiographs performed  - further recs to follow

## 2017-03-22 LAB — GLUCOSE, POC
Glucose (POC): 192 mg/dL — ABNORMAL HIGH (ref 65–100)
Glucose (POC): 145 mg/dL — ABNORMAL HIGH (ref 65–100)
Glucose (POC): 188 mg/dL — ABNORMAL HIGH (ref 65–100)
Glucose (POC): 201 mg/dL — ABNORMAL HIGH (ref 65–100)

## 2017-03-22 LAB — METABOLIC PANEL, BASIC
Anion gap: 7 mmol/L (ref 5–15)
BUN/Creatinine ratio: 25 — ABNORMAL HIGH (ref 12–20)
BUN: 21 MG/DL — ABNORMAL HIGH (ref 6–20)
CO2: 36 mmol/L — ABNORMAL HIGH (ref 21–32)
Calcium: 8.3 MG/DL — ABNORMAL LOW (ref 8.5–10.1)
Chloride: 92 mmol/L — ABNORMAL LOW (ref 97–108)
Creatinine: 0.84 MG/DL (ref 0.55–1.02)
GFR est AA: >60 mL/min/{1.73_m2} (ref >60)
GFR est non-AA: >60 mL/min/{1.73_m2} (ref >60)
Glucose: 141 mg/dL — ABNORMAL HIGH (ref 65–100)
Potassium: 3.7 mmol/L (ref 3.5–5.1)
Sodium: 135 mmol/L — ABNORMAL LOW (ref 136–145)

## 2017-03-22 MED ORDER — LIDOCAINE (PF) 10 MG/ML (1 %) IJ SOLN
10 mg/mL (1 %) | Freq: Once | INTRAMUSCULAR | Status: AC
Start: 2017-03-22 — End: 2017-03-22
  Administered 2017-03-22: 14:00:00 via INTRADERMAL

## 2017-03-22 MED ORDER — METHYLPREDNISOLONE 40 MG/ML SUSP FOR INJECTION
40 mg/mL | Freq: Once | INTRAMUSCULAR | Status: AC
Start: 2017-03-22 — End: 2017-03-22
  Administered 2017-03-22: 12:00:00 via INTRA_ARTICULAR

## 2017-03-22 MED FILL — DEPO-MEDROL 40 MG/ML SUSPENSION FOR INJECTION: 40 mg/mL | INTRAMUSCULAR | Qty: 1

## 2017-03-22 MED FILL — METHOCARBAMOL 500 MG TAB: 500 mg | ORAL | Qty: 1

## 2017-03-22 MED FILL — LOVENOX 40 MG/0.4 ML SUBCUTANEOUS SYRINGE: 40 mg/0.4 mL | SUBCUTANEOUS | Qty: 0.4

## 2017-03-22 MED FILL — HYDROCODONE-ACETAMINOPHEN 5 MG-325 MG TAB: 5-325 mg | ORAL | Qty: 1

## 2017-03-22 MED FILL — TOPROL XL 25 MG TABLET,EXTENDED RELEASE: 25 mg | ORAL | Qty: 1

## 2017-03-22 MED FILL — FUROSEMIDE 10 MG/ML IJ SOLN: 10 mg/mL | INTRAMUSCULAR | Qty: 4

## 2017-03-22 MED FILL — AMLODIPINE 5 MG TAB: 5 mg | ORAL | Qty: 1

## 2017-03-22 MED FILL — HEALTHYLAX 17 GRAM ORAL POWDER PACKET: 17 gram | ORAL | Qty: 1

## 2017-03-22 MED FILL — ACETYLCYSTEINE 20 % (200 MG/ML) SOLN: 200 mg/mL (20 %) | Qty: 4

## 2017-03-22 MED FILL — ASPIRIN 81 MG TAB, DELAYED RELEASE: 81 mg | ORAL | Qty: 1

## 2017-03-22 MED FILL — PRAVASTATIN 10 MG TAB: 10 mg | ORAL | Qty: 2

## 2017-03-22 MED FILL — XYLOCAINE-MPF 10 MG/ML (1 %) INJECTION SOLUTION: 10 mg/mL (1 %) | INTRAMUSCULAR | Qty: 10

## 2017-03-22 NOTE — Progress Notes (Signed)
PCU SHIFT NURSING NOTE    Bedside and Verbal shift change report given to [facility], RN (oncoming nurse) by Molli Hazard, RN (offgoing nurse). Report included the following information SBAR, Kardex, Intake/Output, MAR, Accordion, Recent Results, Cardiac Rhythm NSR and Alarm Parameters .    Shift Summary:       Admission Date 03/11/2017   Admission Diagnosis Acute respiratory failure with hypoxia (HCC)   Consults IP CONSULT TO CARDIOLOGY  IP CONSULT TO PALLIATIVE CARE - PROVIDER  IP CONSULT TO INTENSIVIST  IP CONSULT TO ORTHOPEDIC SURGERY  IP CONSULT TO ORTHOPEDIC SURGERY        Consults   [] PT   [] OT   [] Speech   [] Case Management      []  Palliative      Cardiac Monitoring Order   [] Yes   [] No     IV drips   [] Yes    Drip:                            Dose:  Drip:                            Dose:  Drip:                            Dose:   [] No     GI Prophylaxis   [] Yes   [] No         DVT Prophylaxis   SCDs:             Ted stockings:         []  Medication   [] Contraindicated   [] None      Activity Level Activity Level: Up with Assistance     Activity Assistance: Partial (two people)   Purposeful Rounding every 1-2 hour?   [] Yes   Schmidt Score  Total Score: 2   Bed Alarm (If score 3 or >)   [] Yes   []  Refused (See signed refusal form in chart)   Braden Score  Braden Score: 16   Braden Score (if score 14 or less)   [] PMT consult   [] Wound Care consult      [] Specialty bed   []  Nutrition consult          Needs prior to discharge:   Home O2 required:    [] Yes   [] No    If yes, how much O2 required?    Other:    Last Bowel Movement: Last Bowel Movement Date: 03/19/17      Influenza Vaccine Received Flu Vaccine for Current Season (usually Sept-March): No    Patient/Guardian Refused (Notify MD): Yes   Pneumonia Vaccine           Diet Active Orders   Diet    DIET CARDIAC Regular      LDAs               Peripheral IV 03/12/17 Left Antecubital (Active)   Site Assessment Clean, dry, & intact 03/22/2017  7:30 PM    Phlebitis Assessment 0 03/22/2017  7:30 PM   Infiltration Assessment 0 03/22/2017  7:30 PM   Dressing Status Clean, dry, & intact 03/22/2017  7:30 PM   Dressing Type Tape;Transparent 03/22/2017  7:30 PM   Hub Color/Line Status Flushed;Patent 03/22/2017  7:30 PM   Action Taken Open ports on tubing capped 03/22/2017  7:15 AM  Alcohol Cap Used Yes 03/22/2017  7:30 PM          External Female Catheter 03/12/17 (Active)   Site Assessment Clean, dry, & intact 03/22/2017  7:30 PM   Repositioned Yes 03/22/2017  7:30 PM   Perineal Care Yes 03/22/2017  7:30 PM   Wick Changed Yes 03/22/2017  7:30 PM   Suction Canister/Tubing Changed No 03/22/2017  7:30 PM   Urine Output (mL) 900 ml 03/20/2017  8:04 PM                Urinary Catheter      Intake & Output Date 03/22/17 0700 - 03/23/17 0659 03/23/17 0700 - 03/24/17 0659   Shift 0700-1859 1900-0659 24 Hour Total 0700-1859 1900-0659 24 Hour Total   INTAKE   Shift Total(mL/kg)         OUTPUT   Urine(mL/kg/hr) 800(0.5)  800        Urine Voided 800  800      Shift Total(mL/kg) 800(6.3)  800(6.3)      NET -800  -800      Weight (kg) 126.8 126.8 126.8 126.8 126.8 126.8         Readmission Risk Assessment Tool Score High Risk            36       Total Score        3 Has Seen PCP in Last 6 Months (Yes=3, No=0)    3 Patient Length of Stay (>5 days = 3)    4 IP Visits Last 12 Months (1-3=4, 4=9, >4=11)    9 Pt. Coverage (Medicare=5 , Medicaid, or Self-Pay=4)    17 Charlson Comorbidity Score (Age + Comorbid Conditions)        Criteria that do not apply:    Married. Living with Significant Other. Assisted Living. LTAC. SNF. or   Rehab       Expected Length of Stay 4d 2h   Actual Length of Stay 11

## 2017-03-22 NOTE — Progress Notes (Signed)
S: continued right knee pain.     O:  Visit Vitals  BP 109/67   Pulse 94   Temp 98 ??F (36.7 ??C)   Resp 19   Ht 5\' 6"  (1.676 m)   Wt 126.8 kg (279 lb 9.6 oz)   SpO2 92%   BMI 45.13 kg/m??       R Knee:  Severe adipose tissue  Palpable patella  Joint line TTP      A/P: Severe R knee arthritis  - not a surgical candidate 2/2 pulmonary disease and severe obesity  - injection to right knee recommended.  - will sign off on patient  - follow up with Dr. Garner Nashaniels prn

## 2017-03-22 NOTE — Progress Notes (Signed)
Hospitalist Progress Note  NAME: Brittney LimesMary S Rieke   DOB:  1950-07-11   MRN:  [id number]       Assessment / Plan:  Lumbar degenerative disc disese/back pain and Left knee pain  Severe Knee and Back Pain today  XR spine, degenerative changes L3-S1, marked disc space narrowing at L4-L5, facet arthropathy L3-S1  PT/OT  Appreciate ortho help with knee injections   PRN norco and tylenol for pain  Difficult to excalate pain meds due to severe COPD    Acute on chronic hypoxic respiratory failure POA with baseline 4-5L of O2  Due to Acute on chronic diastolic congestive heart failure in settings of moderate Pulmonary HTN (ProBNP with [id number] which is significantly elevated in morbidly obese pt)  COPD at baseline, doubt exacerbation with no significant cough and not wheezing  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 There were no regional wall motion abnormalities, was moderate pulmonary hypertension.  Continue Lasix 40mg  iV BID  Stopped IV steroids earlier admission as pulmonary doesn't believe COPD exacerbation and not improvement on steroids and clinically doesn't seems COPD exacerbation  monitor I/Os, daily weights  Continue Nebs  Complete course of Px Levaquin  Cardiology and pulmonary consult appreciated  Cont home anoro ellipta or formulary equivalent  Off High Flow O2, now on 6L O2.   Palliative care was involved, pt decided DNR. Will need to re involve them again if pt doesn't improve    DM2 - Diet Controlled  Last A1C on record 11/2016 is 5.8. No meds listed PTA  Current hyperglycemia seems related to Steroids  Continue SSI for now   ??  HTN  Cont toprol and amlodipine   ??  HLD  Cont home pravastatin  ??  Code Status:   Surrogate Decision Maker: Justine NullShannon Boomer [phone]  ??  DVT Prophylaxis: sq heparin  GI Prophylaxis: not indicated  ??  Baseline: lives at home with family     Subjective: Pt seen and examined at bedside. Knee pain improving,  breathing better. Overnight events d/w RN     Chief Complaint / Reason for Physician Visit: f/u "Respiratory Failure"    Review of Systems:  Symptom Y/N Comments  Symptom Y/N Comments   Fever/Chills n   Chest Pain n    Poor Appetite    Edema     Cough n   Abdominal Pain n    Sputum    Joint Pain y Knee and back pain   SOB/DOE y   Pruritis/Rash     Nausea/vomit    Tolerating PT/OT     Diarrhea    Tolerating Diet     Constipation    Other       Could NOT obtain due to:      Objective:     VITALS:   Last 24hrs VS reviewed since prior progress note. Most recent are:  Patient Vitals for the past 24 hrs:   Temp Pulse Resp BP SpO2   03/22/17 1150 98.2 ??F (36.8 ??C) 92 18 119/68 92 %   03/22/17 0715 98 ??F (36.7 ??C) 94 19 109/67 92 %   03/22/17 0349 98 ??F (36.7 ??C) 93 18 99/58 97 %   03/21/17 2340 98.7 ??F (37.1 ??C) (!) 103 16 112/53 (!) 88 %   03/21/17 1919 98.4 ??F (36.9 ??C) (!) 101 18 115/58 (!) 89 %   03/21/17 1722 ??? 95 ??? 107/47 ???   03/21/17 1655 98.3 ??F (36.8 ??  C) ??? 19 ??? ???     No intake or output data in the 24 hours ending 03/22/17 1410     PHYSICAL EXAM:  General: WD, WN. Alert, cooperative, no acute distress????  EENT:  EOMI. Anicteric sclerae. MMM  Resp:  BL basilar crackle, no wheezing or rales.  No accessory muscle use  CV:  Regular  rhythm,?? No edema  GI:  Soft, Non distended, Non tender. ??+Bowel sounds  Neurologic:?? Alert and oriented X 3, normal speech, at baseline  Psych:???? Good insight.??Not anxious nor agitated  Skin:  No rashes.  No jaundice    Reviewed most current lab test results and cultures  YES  Reviewed most current radiology test results   YES  Review and summation of old records today    NO  Reviewed patient's current orders and MAR    YES  PMH/SH reviewed - no change compared to H&P  ________________________________________________________________________  Care Plan discussed with:    Comments   Patient x    Family      RN x    Care Manager     Consultant                         Multidiciplinary team rounds were held today with case manager, nursing, pharmacist and Higher education careers adviser.  Patient's plan of care was discussed; medications were reviewed and discharge planning was addressed.     ________________________________________________________________________  Total NON critical care TIME: 25 Minutes    Total CRITICAL CARE TIME Spent:   Minutes non procedure based      Comments   >50% of visit spent in counseling and coordination of care     ________________________________________________________________________  Jamesetta So, MD     Procedures: see electronic medical records for all procedures/Xrays and details which were not copied into this note but were reviewed prior to creation of Plan.      LABS:  I reviewed today's most current labs and imaging studies.  Pertinent labs include:  No results for input(s): WBC, HGB, HCT, PLT, HGBEXT, HCTEXT, PLTEXT, HGBEXT, HCTEXT, PLTEXT in the last 72 hours.  Recent Labs     03/22/17  0349 03/21/17  0425 03/20/17  0431   NA 135* 137 137   K 3.7 3.8 4.1   CL 92* 93* 97   CO2 36* 36* 36*   GLU 141* 137* 108*   BUN 21* 21* 24*   CREA 0.84 0.86 0.79   CA 8.3* 8.5 8.1*       Signed: Jamesetta So, MD

## 2017-03-22 NOTE — Procedures (Signed)
Risks/benefits discussed and patient agrees.    Procedure:  Patient superolateral knee prepped with both alcohol and chloroprep. An 2918 G need was placed into the knee joint from a superolateral approach. 40 mg of depomedrol and 4 cc of plain lidocaine was injected into the knee. Patient tolerated procedure.    Plan:  Ice, rest  Follow up with Dr. Garner Nashaniels prn

## 2017-03-22 NOTE — Progress Notes (Signed)
Cardiology Progress Note      03/22/2017 1:11 PM    Admit Date: 03/11/2017    Admit Diagnosis: Acute respiratory failure with hypoxia (HCC)      Subjective:     Brittney Shelton is feeling better. Oxygen requirement lower.    Visit Vitals  BP 119/68 (BP 1 Location: Right arm, BP Patient Position: At rest)   Pulse 92   Temp 98.2 ??F (36.8 ??C)   Resp 18   Ht 5\' 6"  (1.676 m)   Wt 279 lb 9.6 oz (126.8 kg)   SpO2 92%   BMI 45.13 kg/m??       Current Facility-Administered Medications   Medication Dose Route Frequency   ??? polyethylene glycol (MIRALAX) packet 17 g  17 g Oral DAILY   ??? docusate sodium (COLACE) capsule 100 mg  100 mg Oral DAILY PRN   ??? albuterol-ipratropium (DUO-NEB) 2.5 MG-0.5 MG/3 ML  3 mL Nebulization Q6H PRN   ??? furosemide (LASIX) injection 40 mg  40 mg IntraVENous BID   ??? metoprolol succinate (TOPROL-XL) XL tablet 75 mg  75 mg Oral DAILY   ??? acetylcysteine (MUCOMYST) 200 mg/mL (20 %) solution 600 mg  600 mg Oral BID   ??? enoxaparin (LOVENOX) injection 40 mg  40 mg SubCUTAneous Q12H   ??? acetaminophen (TYLENOL) tablet 650 mg  650 mg Oral Q6H PRN   ??? HYDROcodone-acetaminophen (NORCO) 5-325 mg per tablet 1 Tab  1 Tab Oral Q6H PRN   ??? pravastatin (PRAVACHOL) tablet 20 mg  20 mg Oral QHS   ??? aspirin delayed-release tablet 81 mg  81 mg Oral DAILY   ??? methocarbamol (ROBAXIN) tablet 500 mg  500 mg Oral QID   ??? umeclidinium-vilanterol (ANORO ELLIPTA) 62.5 mcg- 25 mcg/inhalation  1 Puff Inhalation DAILY   ??? amLODIPine (NORVASC) tablet 5 mg  5 mg Oral DAILY         Objective:      Physical Exam:  Visit Vitals  BP 119/68 (BP 1 Location: Right arm, BP Patient Position: At rest)   Pulse 92   Temp 98.2 ??F (36.8 ??C)   Resp 18   Ht 5\' 6"  (1.676 m)   Wt 279 lb 9.6 oz (126.8 kg)   SpO2 92%   BMI 45.13 kg/m??     General Appearance:  Well developed, well nourished,alert and oriented x 3, and individual in no acute distress.   Ears/Nose/Mouth/Throat:   Hearing grossly normal.         Neck: Supple.    Chest:   Lungs clear to auscultation bilaterally.   Cardiovascular:  Regular rate and rhythm, S1, S2 normal, no murmur.   Abdomen:   Soft, non-tender, bowel sounds are active.   Extremities: No edema bilaterally.    Skin: Warm and dry.               Data Review:   Labs:    Recent Results (from the past 24 hour(s))   GLUCOSE, POC    Collection Time: 03/21/17  4:11 PM   Result Value Ref Range    Glucose (POC) 155 (H) 65 - 100 mg/dL    Performed by Buford Dresser    GLUCOSE, POC    Collection Time: 03/21/17  9:07 PM   Result Value Ref Range    Glucose (POC) 192 (H) 65 - 100 mg/dL    Performed by Merilynn Finland (PCT)    METABOLIC PANEL, BASIC    Collection Time: 03/22/17  3:49 AM  Result Value Ref Range    Sodium 135 (L) 136 - 145 mmol/L    Potassium 3.7 3.5 - 5.1 mmol/L    Chloride 92 (L) 97 - 108 mmol/L    CO2 36 (H) 21 - 32 mmol/L    Anion gap 7 5 - 15 mmol/L    Glucose 141 (H) 65 - 100 mg/dL    BUN 21 (H) 6 - 20 MG/DL    Creatinine 9.600.84 4.540.55 - 1.02 MG/DL    BUN/Creatinine ratio 25 (H) 12 - 20      GFR est AA >60 >60 ml/min/1.9173m2    GFR est non-AA >60 >60 ml/min/1.6273m2    Calcium 8.3 (L) 8.5 - 10.1 MG/DL   GLUCOSE, POC    Collection Time: 03/22/17  7:33 AM   Result Value Ref Range    Glucose (POC) 145 (H) 65 - 100 mg/dL    Performed by Buford DresserForbes Nicolette    GLUCOSE, POC    Collection Time: 03/22/17 11:15 AM   Result Value Ref Range    Glucose (POC) 188 (H) 65 - 100 mg/dL    Performed by Buford DresserForbes Nicolette        Telemetry: normal sinus rhythm      Assessment:     Active Problems:    Hypercholesterolemia (09/29/2014)      COPD (chronic obstructive pulmonary disease) (HCC) (07/26/2015)      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 Watch renal function.    Mobilize.    [place] Beckie Saltsavindra V, MD

## 2017-03-23 LAB — GLUCOSE, POC
Glucose (POC): 289 mg/dL — ABNORMAL HIGH (ref 65–100)
Glucose (POC): 238 mg/dL — ABNORMAL HIGH (ref 65–100)
Glucose (POC): 157 mg/dL — ABNORMAL HIGH (ref 65–100)

## 2017-03-23 LAB — METABOLIC PANEL, BASIC
Anion gap: 8 mmol/L (ref 5–15)
BUN/Creatinine ratio: 39 — ABNORMAL HIGH (ref 12–20)
BUN: 34 MG/DL — ABNORMAL HIGH (ref 6–20)
CO2: 34 mmol/L — ABNORMAL HIGH (ref 21–32)
Calcium: 8.4 MG/DL — ABNORMAL LOW (ref 8.5–10.1)
Chloride: 93 mmol/L — ABNORMAL LOW (ref 97–108)
Creatinine: 0.88 MG/DL (ref 0.55–1.02)
GFR est AA: >60 mL/min/{1.73_m2} (ref >60)
GFR est non-AA: >60 mL/min/{1.73_m2} (ref >60)
Glucose: 190 mg/dL — ABNORMAL HIGH (ref 65–100)
Potassium: 3.9 mmol/L (ref 3.5–5.1)
Sodium: 135 mmol/L — ABNORMAL LOW (ref 136–145)

## 2017-03-23 MED ORDER — GLUCAGON 1 MG INJECTION
1 mg | INTRAMUSCULAR | Status: DC | PRN
Start: 2017-03-23 — End: 2017-04-04

## 2017-03-23 MED ORDER — DEXTROSE 50% IN WATER (D50W) IV SYRG
INTRAVENOUS | Status: DC | PRN
Start: 2017-03-23 — End: 2017-04-04

## 2017-03-23 MED ORDER — GLUCOSE 4 GRAM CHEWABLE TAB
4 gram | ORAL | Status: DC | PRN
Start: 2017-03-23 — End: 2017-04-04

## 2017-03-23 MED ORDER — INSULIN LISPRO 100 UNIT/ML INJECTION
100 unit/mL | Freq: Four times a day (QID) | SUBCUTANEOUS | Status: DC
Start: 2017-03-23 — End: 2017-04-04
  Administered 2017-04-04: 02:00:00 via SUBCUTANEOUS

## 2017-03-23 MED FILL — METHOCARBAMOL 500 MG TAB: 500 mg | ORAL | Qty: 1

## 2017-03-23 MED FILL — LOVENOX 40 MG/0.4 ML SUBCUTANEOUS SYRINGE: 40 mg/0.4 mL | SUBCUTANEOUS | Qty: 0.4

## 2017-03-23 MED FILL — TOPROL XL 25 MG TABLET,EXTENDED RELEASE: 25 mg | ORAL | Qty: 1

## 2017-03-23 MED FILL — HYDROCODONE-ACETAMINOPHEN 5 MG-325 MG TAB: 5-325 mg | ORAL | Qty: 1

## 2017-03-23 MED FILL — ACETYLCYSTEINE 20 % (200 MG/ML) SOLN: 200 mg/mL (20 %) | Qty: 4

## 2017-03-23 MED FILL — AMLODIPINE 5 MG TAB: 5 mg | ORAL | Qty: 1

## 2017-03-23 MED FILL — FUROSEMIDE 10 MG/ML IJ SOLN: 10 mg/mL | INTRAMUSCULAR | Qty: 4

## 2017-03-23 MED FILL — PRAVASTATIN 10 MG TAB: 10 mg | ORAL | Qty: 2

## 2017-03-23 MED FILL — MAPAP (ACETAMINOPHEN) 325 MG TABLET: 325 mg | ORAL | Qty: 2

## 2017-03-23 MED FILL — ASPIRIN 81 MG TAB, DELAYED RELEASE: 81 mg | ORAL | Qty: 1

## 2017-03-23 MED FILL — HEALTHYLAX 17 GRAM ORAL POWDER PACKET: 17 gram | ORAL | Qty: 1

## 2017-03-23 NOTE — Progress Notes (Addendum)
[facility], Critical Care, and Sleep Medicine    Name: Brittney Shelton MRN: [id number]   DOB: 10-16-1950 Hospital: [facility]   Date: 03/23/2017        IMPRESSION:   ?? Acute respiratory failure, has been able to be weaned back down to 3L per NC continuously.   ?? 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 But did not like the BIPAP.   ?? Pulmonary HTN  ?? Pulmonary nodules, very small.       RECOMMENDATIONS:   ?? Wean O2  ?? Jet nebs  ?? Diurese   ?? Will need out pt pft's  ?? Will need repeat chest ct in 1 year for follow up of the pulmonary nodules.   ?? Mobilize ?? Slow progress  ?? PT/OT  ?? Will follow with you.      Subjective:   NO chest pain, has some left posterior back pain. Did not sleep a lot last pm due to interruptions.   Tolerating po intake well. NO leg pain, no abdominal pain.   No acute events overnight  No acute distress  No acute complaints      Current Facility-Administered Medications   Medication Dose Route Frequency   ??? polyethylene glycol (MIRALAX) packet 17 g  17 g Oral DAILY   ??? furosemide (LASIX) injection 40 mg  40 mg IntraVENous BID   ??? metoprolol succinate (TOPROL-XL) XL tablet 75 mg  75 mg Oral DAILY   ??? acetylcysteine (MUCOMYST) 200 mg/mL (20 %) solution 600 mg  600 mg Oral BID   ??? enoxaparin (LOVENOX) injection 40 mg  40 mg SubCUTAneous Q12H   ??? pravastatin (PRAVACHOL) tablet 20 mg  20 mg Oral QHS   ??? aspirin delayed-release tablet 81 mg  81 mg Oral DAILY   ??? methocarbamol (ROBAXIN) tablet 500 mg  500 mg Oral QID   ??? umeclidinium-vilanterol (ANORO ELLIPTA) 62.5 mcg- 25 mcg/inhalation  1 Puff Inhalation DAILY   ??? amLODIPine (NORVASC) tablet 5 mg  5 mg Oral DAILY       Review of Systems:  A comprehensive review of systems was negative except for: Respiratory: positive for dyspnea on exertion    Objective:  Vital Signs:    Visit Vitals  BP 125/67 (BP 1 Location: Right arm, BP Patient Position: At rest;Supine)   Pulse 81   Temp 98.8 ??F (37.1 ??C)   Resp 18    Ht 5\' 6"  (1.676 m)   Wt 124.2 kg (273 lb 12.8 oz)   SpO2 93%   BMI 44.19 kg/m??       O2 Device: Nasal cannula   O2 Flow Rate (L/min): 6 l/min   Temp (24hrs), Avg:98.4 ??F (36.9 ??C), Min:97.6 ??F (36.4 ??C), Max:98.8 ??F (37.1 ??C)     Intake/Output:   Last shift:      10/22 0701 - 10/22 1900  In: 360 [P.O.:360]  Out: -   Last 3 shifts: 10/20 1901 - 10/22 0700  In: -   Out: 1500 [Urine:1500]    Intake/Output Summary (Last 24 hours) at 03/23/2017 1210  Last data filed at 03/23/2017 0959  Gross per 24 hour   Intake 360 ml   Output 1500 ml   Net -1140 ml      Physical Exam:   General:  Alert, cooperative, no distress, appears stated age. Was able to wean to 3L per NC while at rest. Pox was 92-95%.    Head:  Normocephalic, without obvious abnormality, atraumatic.  Eyes:  Conjunctivae/corneas clear. PERRL, EOMs intact.   Nose: Nares normal. Septum midline. Mucosa normal. No drainage or sinus tenderness.   Throat: Lips, mucosa, and tongue normal. Teeth and gums normal.   Neck: Supple, symmetrical, trachea midline, no adenopathy, thyroid: no enlargment/tenderness/nodules, no carotid bruit and no JVD.   Back:   Symmetric, no curvature. ROM normal.   Lungs:   Clear to auscultation bilaterally. Good air movement. Comfortable.    Chest wall:  No tenderness or deformity.   Heart:  Regular rate and rhythm, S1, S2 normal, no murmur, click, rub or gallop.   Abdomen:   Soft, non-tender. Bowel sounds normal. No masses,  No organomegaly.   Extremities: Extremities normal, atraumatic, no cyanosis, ++ edema.   Pulses: 2+ and symmetric all extremities.   Skin: Skin color, texture, turgor normal. No rashes or lesions   Lymph nodes: Cervical, supraclavicular, and axillary nodes normal.   Neurologic: Grossly nonfocal, seems to have good strength of BUE and BLE.      Data review:     Recent Results (from the past 24 hour(s))   GLUCOSE, POC    Collection Time: 03/22/17  4:26 PM   Result Value Ref Range    Glucose (POC) 201 (H) 65 - 100 mg/dL     Performed by Melvern BankerLethco Chrystal    GLUCOSE, POC    Collection Time: 03/22/17  9:04 PM   Result Value Ref Range    Glucose (POC) 289 (H) 65 - 100 mg/dL    Performed by Merilynn Finlandhambers Michael (PCT)    METABOLIC PANEL, BASIC    Collection Time: 03/23/17  5:00 AM   Result Value Ref Range    Sodium 135 (L) 136 - 145 mmol/L    Potassium 3.9 3.5 - 5.1 mmol/L    Chloride 93 (L) 97 - 108 mmol/L    CO2 34 (H) 21 - 32 mmol/L    Anion gap 8 5 - 15 mmol/L    Glucose 190 (H) 65 - 100 mg/dL    BUN 34 (H) 6 - 20 MG/DL    Creatinine 4.090.88 8.110.55 - 1.02 MG/DL    BUN/Creatinine ratio 39 (H) 12 - 20      GFR est AA >60 >60 ml/min/1.1073m2    GFR est non-AA >60 >60 ml/min/1.3373m2    Calcium 8.4 (L) 8.5 - 10.1 MG/DL   GLUCOSE, POC    Collection Time: 03/23/17 11:03 AM   Result Value Ref Range    Glucose (POC) 238 (H) 65 - 100 mg/dL    Performed by Ferol LuzSHOOK ELIZABETH(CON)        Imaging:  I have personally reviewed the patient???s radiographs and have reviewed the reports:  03-11-17: Ct of chest IMPRESSION:   No evidence of acute pulmonary embolus. Unchanged enlargement of the main   pulmonary artery, compatible with pulmonary hypertension. Unchanged mediastinal   lymphadenopathy. Bilateral indeterminate, ill-defined pulmonary nodules,   measuring up to 5 mm; consider follow-up CT in 12 months to assure stability or   resolution. Additional incidental findings as detailed above.

## 2017-03-23 NOTE — Progress Notes (Signed)
Problem: Pressure Injury - Risk of  Goal: *Prevention of pressure injury  Document Braden Scale and appropriate interventions in the flowsheet.   Outcome: Progressing Towards Goal  Pressure Injury Interventions:  Sensory Interventions: Discuss PT/OT consult with provider    Moisture Interventions: Check for incontinence Q2 hours and as needed    Activity Interventions: Increase time out of bed    Mobility Interventions: PT/OT evaluation    Nutrition Interventions: Document food/fluid/supplement intake    Friction and Shear Interventions: Lift team/patient mobility team

## 2017-03-23 NOTE — Progress Notes (Addendum)
[address] [place], [place] [address]  [phone]  Cardiology Progress Note      03/23/2017 1015AM    Admit Date: 03/11/2017    Admit Diagnosis:   Acute respiratory failure with hypoxia (HCC)    Subjective:     Brittney Shelton is a 66 y.o. female with PMH COPD, HF, HTN, DM who was admitted for Acute respiratory failure with hypoxia (HCC).    Overnight events:  -VSS  -labs steady  -weight shown down 6# since yesterday (? Accuracy): I/O documented net neg ~14L, but multiple documentations missing  -Brittney Shelton states she is feeling well.  Her knee pain is improved since her "shot".  She is still having some back pain.  Her breathing is unchanged.      Visit Vitals  BP 125/67 (BP 1 Location: Right arm, BP Patient Position: At rest;Supine)   Pulse 81   Temp 98.8 ??F (37.1 ??C)   Resp 18   Ht 5\' 6"  (1.676 m)   Wt 124.2 kg (273 lb 12.8 oz)   SpO2 93%   BMI 44.19 kg/m??       Current Facility-Administered Medications   Medication Dose Route Frequency   ??? polyethylene glycol (MIRALAX) packet 17 g  17 g Oral DAILY   ??? docusate sodium (COLACE) capsule 100 mg  100 mg Oral DAILY PRN   ??? albuterol-ipratropium (DUO-NEB) 2.5 MG-0.5 MG/3 ML  3 mL Nebulization Q6H PRN   ??? furosemide (LASIX) injection 40 mg  40 mg IntraVENous BID   ??? metoprolol succinate (TOPROL-XL) XL tablet 75 mg  75 mg Oral DAILY   ??? acetylcysteine (MUCOMYST) 200 mg/mL (20 %) solution 600 mg  600 mg Oral BID   ??? enoxaparin (LOVENOX) injection 40 mg  40 mg SubCUTAneous Q12H   ??? acetaminophen (TYLENOL) tablet 650 mg  650 mg Oral Q6H PRN   ??? HYDROcodone-acetaminophen (NORCO) 5-325 mg per tablet 1 Tab  1 Tab Oral Q6H PRN   ??? pravastatin (PRAVACHOL) tablet 20 mg  20 mg Oral QHS   ??? aspirin delayed-release tablet 81 mg  81 mg Oral DAILY   ??? methocarbamol (ROBAXIN) tablet 500 mg  500 mg Oral QID   ??? umeclidinium-vilanterol (ANORO ELLIPTA) 62.5 mcg- 25 mcg/inhalation  1 Puff Inhalation DAILY   ??? amLODIPine (NORVASC) tablet 5 mg  5 mg Oral DAILY        Objective:      Physical Exam:  General: pleasant, obese AAF resting in bed in NAD  Heart: RRR, no murmur   Lungs: clear/dim   Abdomen: Soft, +BS, NTND   Extremities: LE bil +DP/PT, ? Lymphedema vs obesity.  Chronic appearance and difficult to determine if any edema. Appears slightly less than on admission.    Neurologic: Grossly normal  Skin:?? Warm and dry.??    Data Review:   No results for input(s): WBC, HGB, HCT, PLT, HGBEXT, HCTEXT, PLTEXT, HGBEXT, HCTEXT, PLTEXT in the last 72 hours.  Recent Labs     03/23/17  0500 03/22/17  0349 03/21/17  0425   NA 135* 135* 137   K 3.9 3.7 3.8   CL 93* 92* 93*   CO2 34* 36* 36*   GLU 190* 141* 137*   BUN 34* 21* 21*   CREA 0.88 0.84 0.86   CA 8.4* 8.3* 8.5       No results for input(s): TROIQ, CPK, CKMB in the last 72 hours.      Intake/Output Summary (Last 24 hours) at 03/23/2017  1147  Last data filed at 03/23/2017 0959  Gross per 24 hour   Intake 360 ml   Output 1500 ml   Net -1140 ml        Telemetry:  SR   ECG: read as afib (tele review did not show a fib)    Echocardiogram: EF 60-65%; NWMA; mild TR; mod PHTN   CXRAY: "Study is compromised by patient's body habitus. There is question of a vague patchy density at the right lung base. PA and lateral views are recommended when the patient is stable for further assessment."  CTA chest: "No evidence of acute pulmonary embolus. Unchanged enlargement of the main pulmonary artery, compatible with pulmonary hypertension. Unchanged mediastinal lymphadenopathy. Bilateral indeterminate, ill-defined pulmonary nodules, measuring up to 5 mm; consider follow-up CT in 12 months to assure stability or  resolution. 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 CTA neg for PE.  Troponin neg.  proBNP [phone] with pulmonary HTN on ECHO.  Patient has felt at her baseline.  COPD   ?? Remains on IV lasix.  Patient denies respiratory complaints.  Weight has finally decreased some, if accurate.  Charting shows net neg ~14L.    ?? Pulmonary following         HTN:  BP stable   ?? Multiple home meds still on hold  ?? Continue lower dose of amlodipine and current dose of toprol         No additional cardiology recommendations.  Diuresis appears to be having limited impact on her respiratory status, but is not damaging her kidneys to continue.  Will sign off. Please call if questions.        Brittney Meagerosanna Hastings, NP  DNP, RN, AGACNP-BC      [facility]    03/23/2017         Patient seen, examined by me personally. Plan discussed as detailed. Agree with note as outlined by  NP. I confirm findings in history and physical exam. No additional findings noted. Agree with plan as outlined above.     [place] Beckie Saltsavindra V, MD

## 2017-03-23 NOTE — Progress Notes (Signed)
Bedside Shift report  Breakfast  Pain not well controlled   BS high, no orders for AC/HS checks or sliding scale- asked during IDR  Lunch  AC/HS checks and sliding scale ordered, started 1630  (Norco given right before)   Helped patient to an upright position to facilitate better lung expansion to improve gas exchange, patient's SPO2 rose to 95% but patient stated the position hurt her back too much    PT and OT worked with her, she was able to get into a standing position using the walker and transfer to the chair  Called Dr. Samara Snideuffin to see if anything could be done to help control her pain because the Norco q 6 hours was not working, the patient continued to state a pain level between 7-8 despite medication.   Asked Respiratory therapist to give ideas on how to improve SPO2, stated he would get a new Venturi Mask and would see her again  Attempted to wean O2 by removing the Venturi Mask, but SPO2 dropped down to 77%, had to put back on  No sliding scale coverage was needed  Dinner  Gave bedside report to [place] including SBAR, MAR, cardiac rhythm.

## 2017-03-23 NOTE — Progress Notes (Signed)
Received call from Kassi from [facility] patient has been accepted when medically stable. Informed Dr Samara Snideuffin of above when patient is medically stable.  Patient aware and in agreement. FOC signed and placed on chart.

## 2017-03-23 NOTE — Progress Notes (Signed)
Hospitalist Progress Note  NAME: Brittney Shelton   DOB:  11-Apr-1951   MRN:  [id number]       Assessment / Plan:  Lumbar degenerative disc disese/back pain and right knee pain  XR spine, degenerative changes L3-S1, marked disc space narrowing at L4-L5, facet arthropathy L3-S1  PT/OT  S/p R knee arthrocentesis 10/21.  Appreciate ortho help with knee injections   PRN norco and tylenol for pain  Difficult to excalate pain meds due to severe COPD    Acute on chronic hypoxic respiratory failure POA with baseline 4-5L of O2  Due to Acute on chronic diastolic congestive heart failure in settings of moderate Pulmonary HTN (ProBNP with [id number] which is significantly elevated in morbidly obese pt)  COPD at baseline, doubt exacerbation with no significant cough and not wheezing  Obesity hypoventilation suspected.  Repeat Echo 10/11, EF 60 % to 65 %. There were no regional wall motion abnormalities, was moderate pulmonary hypertension.  IV steroids discontinued earlier admission by pulmonary since COPD exacerbation not likely and no improvement with steroids.   monitor I/Os, daily weights  Continue Nebs prn  Mucomyst   Cont IV lasix bid  Completed course of Px Levaquin  Cardiology and pulmonary consult appreciated  Cont home anoro ellipta or formulary equivalent  Required high flow O2. Now on 6L O2.  Wean to baseline 4 L NC.  Outpatient PFTs  Repeat Chest CT in 1 year as outpatient to follow up pulm nodules.  Palliative care was involved, pt decided DNR.     DM2 - Diet Controlled  Last A1C on record 11/2016 is 5.8. No meds listed PTA  hyperglycemia likely related to Steroids  Continue SSI for now   ??  HTN  Cont toprol and amlodipine   ??  HLD  Cont home pravastatin    Morbid obesity - due to excess weight.    Constipation   Cont laxative    Generalized weakness  -cont PT/OT    Plan for dc to SNF tomorrow.  Try to titrate down O2 to 4 L nc before dc.  ??  Code Status:   Surrogate Decision Maker: Justine Null [phone]  ??   DVT Prophylaxis: sq heparin  GI Prophylaxis: not indicated  ??  Baseline: lives at home with family     Subjective: Pt seen and examined at bedside. Knee pain improving, breathing better. Overnight events d/w RN     Chief Complaint / Reason for Physician Visit: f/u "Respiratory Failure"  Breathing okay. No cough.    Review of Systems:  Symptom Y/N Comments  Symptom Y/N Comments   Fever/Chills n   Chest Pain n    Poor Appetite    Edema     Cough n   Abdominal Pain n    Sputum    Joint Pain y Knee and back pain   SOB/DOE    Pruritis/Rash     Nausea/vomit    Tolerating PT/OT     Diarrhea    Tolerating Diet     Constipation    Other       Could NOT obtain due to:      Objective:     VITALS:   Last 24hrs VS reviewed since prior progress note. Most recent are:  Patient Vitals for the past 24 hrs:   Temp Pulse Resp BP SpO2   03/23/17 1045 98.8 ??F (37.1 ??C) 81 18 125/67 93 %   03/23/17 0852 ??? ??? ??? ??? 96 %  03/23/17 0758 97.6 ??F (36.4 ??C) 76 18 134/66 100 %   03/23/17 0305 98.7 ??F (37.1 ??C) 78 18 121/64 99 %   03/23/17 0049 98 ??F (36.7 ??C) 93 16 144/77 96 %   03/22/17 1930 98.8 ??F (37.1 ??C) (!) 108 18 127/63 (!) 89 %   03/22/17 1515 98.4 ??F (36.9 ??C) 93 18 103/60 93 %       Intake/Output Summary (Last 24 hours) at 03/23/2017 1325  Last data filed at 03/23/2017 0959  Gross per 24 hour   Intake 360 ml   Output 1500 ml   Net -1140 ml        PHYSICAL EXAM:  General: WD, WN. Alert, cooperative, no acute distress????  EENT:  EOMI. Anicteric sclerae. MMM  Resp:  Decreased breath sounds, no wheezing or rales.  No accessory muscle use  CV:  Regular  rhythm,?? 2+ leg edema  GI:  Soft, Non distended, Non tender. ??+Bowel sounds  Neurologic:?? Alert and oriented X 3, normal speech, at baseline  Psych:???? Good insight.??Not anxious nor agitated  Skin:  No rashes.  No jaundice    Reviewed most current lab test results and cultures  YES  Reviewed most current radiology test results   YES  Review and summation of old records today    NO   Reviewed patient's current orders and MAR    YES  PMH/SH reviewed - no change compared to H&P  ________________________________________________________________________  Care Plan discussed with:    Comments   Patient x    Family      RN x    Care Manager     Consultant                        Multidiciplinary team rounds were held today with case manager, nursing, pharmacist and Higher education careers adviserclinical coordinator.  Patient's plan of care was discussed; medications were reviewed and discharge planning was addressed.     ________________________________________________________________________  Total NON critical care TIME: 25 Minutes    Total CRITICAL CARE TIME Spent:   Minutes non procedure based      Comments   >50% of visit spent in counseling and coordination of care     ________________________________________________________________________  Evalina Fieldichard E Citlalli Weikel, MD     Procedures: see electronic medical records for all procedures/Xrays and details which were not copied into this note but were reviewed prior to creation of Plan.      LABS:  I reviewed today's most current labs and imaging studies.  Pertinent labs include:  No results for input(s): WBC, HGB, HCT, PLT, HGBEXT, HCTEXT, PLTEXT, HGBEXT, HCTEXT, PLTEXT in the last 72 hours.  Recent Labs     03/23/17  0500 03/22/17  0349 03/21/17  0425   NA 135* 135* 137   K 3.9 3.7 3.8   CL 93* 92* 93*   CO2 34* 36* 36*   GLU 190* 141* 137*   BUN 34* 21* 21*   CREA 0.88 0.84 0.86   CA 8.4* 8.3* 8.5       Signed: Evalina Fieldichard E Patrena Santalucia, MD

## 2017-03-23 NOTE — Progress Notes (Signed)
Problem: Self Care Deficits Care Plan (Adult)  Goal: *Acute Goals and Plan of Care (Insert Text)  Occupational Therapy Goals  Routine 7 day Re-eval 03/23/2017  1.  Patient will perform grooming standing with supervision/set-up 7 days.   2.  Patient will participate in anterior bathing with Min A from seated position within 7 days.  3.  Patient will perform rolling in bed with Mod A x1 in preparation for toileting and hygiene within 7 days.  4.  Patient will perform toilet transfer to bedside commode with supervision within 7 days.   5.  Patient will participate in all aspects of toileting with overall Mod A x1 within 7 days.  6.  Patient will complete upper body therapeutic exercises/activities to maximize independence and safety with ADLs for at least 5 minutes within 7 days.      Initiated 03/12/2017  1.  Patient will perform simple grooming tasks in full bed/chair position for at least 5 minutes within 7 days. Met 03/23/2017 Upgrade to standing with modified independence  2.  Patient will participate in anterior bathing with Min A from seated position within 7 days.  3.  Patient will perform rolling in bed with Mod A x1 in preparation for toileting and hygiene within 7 days. Met 03/23/2017  4.  Patient will perform toilet transfer with overall Mod A x2 and least restrictive device PRN within 7 days. Upgrade to modified independence 03/16/2017  5.  Patient will participate in all aspects of toileting with overall Mod A x1 within 7 days.  6.  Patient will complete upper body therapeutic exercises/activities to maximize independence and safety with ADLs for at least 5 minutes within 7 days.    Occupational Therapy ReEVALUATION  Patient: Brittney Shelton [phone] y.o. female)  Date: 03/23/2017  Diagnosis: Acute respiratory failure with hypoxia (HCC) <principal problem not specified>      Precautions: Fall, DNR    ASSESSMENT :  Based on the objective data described below, the patient presents with  severe pain in left knee despite pain meds.  Pt continues to have back pain as well.  She was on 6 liters NC and 40% venti upon arrival. Just prior to therapist arrival pt was on 5 liters NC with O2 sat 88-95%.  She remains motivated to participate but very limited by pain.  Pt has met two goals from eval and is continuing to benefit from OT services.  Pt is performing UB ADLs at a set up to min assist level and lower body ADLs at a max total assist level.  Max assist x2 today for sit to stand with CGA/min assist x2 to stand pivot to bedside chair. She briefly needed 40% venti plus 6 liters NC to recover O2 sat from 81% after transfer to chair.  Pt was holding her breath due to pain.  At end of session pt was back on 6 liters NC with o2 sat of 95% and nurse was present and approved to have pt remain on 6 liters NC.  Recommend SNF [facility] or Inpatient pulmonary rehab pending further activity tolerance.  10/10 pain reported today with mobility in right knee.    Patient will benefit from skilled intervention to address the above impairments.  Patient???s rehabilitation potential is considered to be Fair  Factors which may influence rehabilitation potential include:   _0                 None noted  _1   Mental ability/status  _0                 Medical condition  _1                 Home/family situation and support systems  _2                 Safety awareness  _3                 Pain tolerance/management  _4                 Other:      PLAN :  Recommendations and Planned Interventions:  _5                   Self Care Training                  _6            Therapeutic Activities  _7                   Functional Mobility Training    _8            Cognitive Retraining  _9                   Therapeutic Exercises           _10            Endurance Activities  _11                   Balance Training                   _12            Neuromuscular Re-Education  _13                   Visual/Perceptual Training     _14       Home Safety  Training  _15                   Patient Education                 _16            Family Training/Education  _17                   Other (comment):    Frequency/Duration: Patient will be followed by occupational therapy 4 times a week to address goals.  Discharge Recommendations:SNF [facility] vs.  Inpatient Pulmonary Rehab  Further Equipment Recommendations for Discharge: none     SUBJECTIVE:   Patient stated ???No.  I am going to get to that chair.  I hurts but I am going to try.???    OBJECTIVE DATA SUMMARY:   Hospital course since last seen and reason for reevaluation: varying level of O2 requirements Cognitive/Behavioral Status:  Neurologic State: Alert  Orientation Level: Oriented X4  Cognition: Follows commands  Perception: Appears intact  Perseveration: No perseveration noted  Safety/Judgement: Awareness of environment;Fall prevention;Home safety  Vision/Perceptual:            Intact/reading glassess                         Range of Motion:  Generally decreased but functional                          Strength:  Generally decreased but functional  Coordination:        WFL     Tone & Sensation:  intact                          Balance:  Sitting: Intact  Standing: Impaired  Standing - Static: Constant support;FairFunctional Mobility and Transfers for ADLs:Bed Mobility:  Scooting: Moderate assistance(inital CGA; limited by pain)    Transfers:  Sit to Stand: Maximum assistance;Assist x2;Additional time  Functional Transfers  Bathroom Mobility: (unable at this time)  Toilet Transfer : Maximum assistance   Bed to Chair: Contact guard assistance;Additional time;Assist x2(with rolling walker)    ADL Assessment:  Feeding: Independent(once providied)    Oral Facial Hygiene/Grooming: Setup(seated)    Bathing: Moderate assistance    Upper Body Dressing: Moderate assistance    Lower Body Dressing: Total assistance    Toileting: Total assistance              ADL Intervention:     See assessment  Cognitive Retraining   Safety/Judgement: Awareness of environment;Fall prevention;Home safety      Pain:  Pain Scale 1: Numeric (0 - 10)  Pain Intensity 1: 8  Pain Location 1: Back;Knee  Pain Orientation 1: Left  Pain Description 1: Aching  Pain Intervention(s) 1: Medication (see MAR)  Activity Tolerance:     Please refer to the flowsheet for vital signs taken during this treatment.  After treatment:   _0  Patient left in no apparent distress sitting up in chair  _1  Patient left in no apparent distress in bed  _2  Call bell left within reach  _3  Nursing notified  _4  Caregiver present  _5  Bed alarm activated    COMMUNICATION/EDUCATION:   The patient???s plan of care was discussed with: Physical Therapist, Registered Nurse and patient.  _6     Home safety education was provided and the patient/caregiver indicated understanding.  _7     Patient have participated as able in goal setting and plan of care.  _8     Patient agree to work toward stated goals and plan of care.  _9     Patient understands intent and goals of therapy, but is neutral about his/her participation.  _10     Patient is unable to participate in goal setting and plan of care.  This patient???s plan of care is appropriate for delegation to OTA.    Thank you for this referral.  Meredeth Ide, OTR/L  Time Calculation: 30 mins

## 2017-03-23 NOTE — Progress Notes (Signed)
Physical Therapy Goals  Updated 03/19/2017  1.  Patient will move from supine to sit and sit to supine  in bed with modified independence within 7 day(s).    2.  Patient will transfer from bed to chair and chair to bed with modified independence using the least restrictive device within 7 day(s).  3.  Patient will perform sit to stand with modified independence within 7 day(s).  4.  Patient will ambulate with supervision/set-up for 100 feet with the least restrictive device within 7 day(s).   5.  Patient will ascend/descend 3 stairs with 1 handrail(s) with minimal assistance within 7 day(s)    Physical Therapy Goals  Initiated 03/12/2017  1.  Patient will move from supine to sit and sit to supine  in bed with modified independence within 7 day(s).    2.  Patient will transfer from bed to chair and chair to bed with modified independence using the least restrictive device within 7 day(s).  3.  Patient will perform sit to stand with modified independence within 7 day(s).  4.  Patient will ambulate with supervision/set-up for 50 feet with the least restrictive device within 7 day(s).   5.  Patient will ascend/descend 3 stairs with 1 handrail(s) with minimal assistance/contact guard assist within 7 day(s).     physical Therapy TREATMENT  Patient: Brittney Shelton [phone] y.o. female)  Date: 03/23/2017  Diagnosis: Acute respiratory failure with hypoxia (HCC) <principal problem not specified>      Precautions: Fall, DNR  Chart, physical therapy assessment, plan of care and goals were reviewed.    ASSESSMENT: Patient reporting severe L knee pain which is significantly limiting her ability to perform any mobility. Patient requesting to get to chair despite knee pain. Patient requiring significant time for all mobility secondary to L knee pain. Patient requiring mod A for bed mobility and max A of 2 to transition to standing. Once standing patient able to take a few steps bed to chair using RW and min A of 2. Patient  holding breath secondary to pain and O2 sats decreased to 82% on 6 l/min following transfer to chair. Ventimask at 40% reapplied briefly and O2 sats returned to 98%. Able to return to nasal cannula at 6 l/min and maintain saturations at 96%.  L knee pain is profoundly limiting functional mobility and independence at this time.  Recommend SNF following discharge.    Progression toward goals:  []     Improving appropriately and progressing toward goals  []     Improving slowly and progressing toward goals  [x]     Not making progress toward goals and plan of care will be adjusted if not progress noted in next 1-2 sessions     PLAN:  Patient continues to benefit from skilled intervention to address the above impairments.  Continue treatment per established plan of care.  Discharge Recommendations:  Skilled Nursing Facility  Further Equipment Recommendations for Discharge:  TBD by rehab     SUBJECTIVE:   Patient stated ???My knee is killing me, but I want to get to the chair.???    OBJECTIVE DATA SUMMARY:   Critical Behavior:  Neurologic State: Alert  Orientation Level: Oriented X4  Cognition: Follows commands  Safety/Judgement: Awareness of environment, Fall prevention, Home safety  Functional Mobility Training:  Bed Mobility:     Supine to Sit: Minimum assistance;Additional time(significant additional time)     Scooting: Moderate assistance(inital CGA; limited by pain)        Transfers:  Sit to Stand: Maximum assistance;Assist x2;Additional time  Stand to Sit: Minimum assistance        Bed to Chair: Minimum assistance;Assist x2                    Balance:  Sitting: Intact  Standing: Impaired  Standing - Static: Constant support;Fair  Standing - Dynamic : Poor(using RW for support)  Ambulation/Gait Training:  Distance (ft): (few steps bed to chair)  Assistive Device: Walker, rolling;Gait belt  Ambulation - Level of Assistance: Minimal assistance;Assist x2         Gait Abnormalities: Decreased step clearance;Trunk sway increased        Base of Support: Widened     Speed/Cadence: Pace decreased (<100 feet/min);Shuffled;Slow  Step Length: Left shortened;Right shortened      Gait is slow and antalgic; increased trunk sway       Pain:  Pain Scale 1: Numeric (0 - 10)  Pain Intensity 1: 7  Pain Location 1: Back;Knee  Pain Orientation 1: Left  Pain Description 1: Aching  Pain Intervention(s) 1: Emotional support  Activity Tolerance:   O2 sats decreased to 82% post transfer to chair requiring ventimask briefly at 40% briefly to recover and then returned to nasal cannula on 6 l/min  Please refer to the flowsheet for vital signs taken during this treatment.  After treatment:   [x]     Patient left in no apparent distress sitting up in chair  []     Patient left in no apparent distress in bed  [x]     Call bell left within reach  [x]     Nursing notified  [x]     Caregiver present  []     Bed alarm activated    COMMUNICATION/COLLABORATION:   The patient???s plan of care was discussed with: Occupational Therapist and Registered Nurse    Nicholes StairsSheila R Salter, PT   Time Calculation: 28 mins

## 2017-03-24 LAB — GLUCOSE, POC
Glucose (POC): 201 mg/dL — ABNORMAL HIGH (ref 65–100)
Glucose (POC): 120 mg/dL — ABNORMAL HIGH (ref 65–100)
Glucose (POC): 191 mg/dL — ABNORMAL HIGH (ref 65–100)
Glucose (POC): 140 mg/dL — ABNORMAL HIGH (ref 65–100)

## 2017-03-24 MED FILL — LOVENOX 40 MG/0.4 ML SUBCUTANEOUS SYRINGE: 40 mg/0.4 mL | SUBCUTANEOUS | Qty: 0.4

## 2017-03-24 MED FILL — METHOCARBAMOL 500 MG TAB: 500 mg | ORAL | Qty: 1

## 2017-03-24 MED FILL — PRAVASTATIN 10 MG TAB: 10 mg | ORAL | Qty: 2

## 2017-03-24 MED FILL — AMLODIPINE 5 MG TAB: 5 mg | ORAL | Qty: 1

## 2017-03-24 MED FILL — ACETYLCYSTEINE 20 % (200 MG/ML) SOLN: 200 mg/mL (20 %) | Qty: 4

## 2017-03-24 MED FILL — FUROSEMIDE 10 MG/ML IJ SOLN: 10 mg/mL | INTRAMUSCULAR | Qty: 4

## 2017-03-24 MED FILL — MAPAP (ACETAMINOPHEN) 325 MG TABLET: 325 mg | ORAL | Qty: 2

## 2017-03-24 MED FILL — HYDROCODONE-ACETAMINOPHEN 5 MG-325 MG TAB: 5-325 mg | ORAL | Qty: 1

## 2017-03-24 MED FILL — HEALTHYLAX 17 GRAM ORAL POWDER PACKET: 17 gram | ORAL | Qty: 1

## 2017-03-24 MED FILL — ASPIRIN 81 MG TAB, DELAYED RELEASE: 81 mg | ORAL | Qty: 1

## 2017-03-24 MED FILL — TOPROL XL 25 MG TABLET,EXTENDED RELEASE: 25 mg | ORAL | Qty: 1

## 2017-03-24 NOTE — Progress Notes (Signed)
Hospitalist Progress Note  NAME: Brittney Shelton   DOB:  April 15, 1951   MRN:  [id number]       Assessment / Plan:  Lumbar degenerative disc disese/back pain and right knee pain  XR spine, degenerative changes L3-S1, marked disc space narrowing at L4-L5, facet arthropathy L3-S1  PT/OT  S/p R knee arthrocentesis 10/21.  Appreciate ortho help with knee injections   PRN norco and tylenol for pain  Difficult to excalate pain meds due to severe COPD    Acute on chronic hypoxic respiratory failure POA with baseline 4-5L of O2  Due to Acute on chronic diastolic congestive heart failure in settings of moderate Pulmonary HTN (ProBNP with 69629 which is significantly elevated in morbidly obese pt)  COPD at baseline, doubt exacerbation with no significant cough and not wheezing  Obesity hypoventilation suspected.  Repeat Echo 10/11, EF 60 % to 65 %. There were no regional wall motion abnormalities, was moderate pulmonary hypertension.  IV steroids discontinued earlier admission by pulmonary since COPD exacerbation not likely and no improvement with steroids.   monitor I/Os, daily weights  Continue Nebs prn  Mucomyst   Cont IV lasix bid  Completed course of Px Levaquin  Cardiology and pulmonary consult appreciated  Cont home anoro ellipta or formulary equivalent  Required high flow O2. Now on 6L O2.  Wean to baseline 4 L NC as tolerated. Still requiring venimask.   Outpatient PFTs  Repeat Chest CT in 1 year as outpatient to follow up pulm nodules.  Palliative care was involved, pt decided DNR.     DM2 - Diet Controlled  Last A1C on record 11/2016 is 5.8. No meds listed PTA  hyperglycemia likely related to Steroids  Continue SSI for now   ??  HTN  Cont toprol and amlodipine   ??  HLD  Cont home pravastatin    Morbid obesity - due to excess weight.    Constipation   Cont laxative    Generalized weakness  -cont PT/OT    Plan for dc to SNF once respiratory status stable.  ??  Code Status:    Surrogate Decision Maker: Justine Null [phone]  ??  DVT Prophylaxis: sq heparin  GI Prophylaxis: not indicated  ??  Baseline: lives at home with family     Subjective: Pt seen and examined at bedside. Knee pain improving, breathing better. Overnight events d/w RN     Chief Complaint / Reason for Physician Visit: f/u "Respiratory Failure"  Sob w/ activity. No cp/abd pain. No cough.  D/w nurse. Refusing insulin w/ SSI.    Review of Systems:  Symptom Y/N Comments  Symptom Y/N Comments   Fever/Chills n   Chest Pain n    Poor Appetite    Edema     Cough n   Abdominal Pain n    Sputum    Joint Pain y Knee and back pain   SOB/DOE    Pruritis/Rash     Nausea/vomit    Tolerating PT/OT     Diarrhea    Tolerating Diet     Constipation    Other       Could NOT obtain due to:      Objective:     VITALS:   Last 24hrs VS reviewed since prior progress note. Most recent are:  Patient Vitals for the past 24 hrs:   Temp Pulse Resp BP SpO2   03/24/17 1139 97.4 ??F (36.3 ??C) 74 16 ??? 100 %   03/24/17  0802 97.1 ??F (36.2 ??C) 78 16 117/67 100 %   03/24/17 0300 97.7 ??F (36.5 ??C) 80 16 142/69 97 %   03/24/17 0005 ??? ??? ??? ??? 96 %   03/23/17 2253 97.6 ??F (36.4 ??C) 80 16 123/65 100 %   03/23/17 2014 97.6 ??F (36.4 ??C) 84 16 124/69 93 %   03/23/17 1604 97.6 ??F (36.4 ??C) 82 16 123/77 95 %       Intake/Output Summary (Last 24 hours) at 03/24/2017 1449  Last data filed at 03/24/2017 1033  Gross per 24 hour   Intake 870 ml   Output 1350 ml   Net -480 ml        PHYSICAL EXAM:  General: WD, WN. Alert, cooperative, no acute distress????  EENT:  EOMI. Anicteric sclerae. MMM  Resp:  Decreased breath sounds, no wheezing or rales.  No accessory muscle use  CV:  Regular  rhythm,?? 2+ leg edema  GI:  Soft, Non distended, Non tender. ??+Bowel sounds  Neurologic:?? Alert and oriented X 3, normal speech, at baseline  Psych:???? Good insight.??Not anxious nor agitated  Skin:  No rashes.  No jaundice    Reviewed most current lab test results and cultures  YES   Reviewed most current radiology test results   YES  Review and summation of old records today    NO  Reviewed patient's current orders and MAR    YES  PMH/SH reviewed - no change compared to H&P  ________________________________________________________________________  Care Plan discussed with:    Comments   Patient x    Family      RN x    Care Manager     Consultant                       x Multidiciplinary team rounds were held today with case manager, nursing, pharmacist and clinical coordinator.  Patient's plan of care was discussed; medications were reviewed and discharge planning was addressed.     ________________________________________________________________________  Total NON critical care TIME: 25 Minutes    Total CRITICAL CARE TIME Spent:   Minutes non procedure based      Comments   >50% of visit spent in counseling and coordination of care     ________________________________________________________________________  Evalina Fieldichard E Shonta Phillis, MD     Procedures: see electronic medical records for all procedures/Xrays and details which were not copied into this note but were reviewed prior to creation of Plan.      LABS:  I reviewed today's most current labs and imaging studies.  Pertinent labs include:  No results for input(s): WBC, HGB, HCT, PLT, HGBEXT, HCTEXT, PLTEXT, HGBEXT, HCTEXT, PLTEXT in the last 72 hours.  Recent Labs     03/23/17  0500 03/22/17  0349   NA 135* 135*   K 3.9 3.7   CL 93* 92*   CO2 34* 36*   GLU 190* 141*   BUN 34* 21*   CREA 0.88 0.84   CA 8.4* 8.3*       Signed: Evalina Fieldichard E Travius Crochet, MD

## 2017-03-24 NOTE — Progress Notes (Signed)
Problem: Self Care Deficits Care Plan (Adult)  Goal: *Acute Goals and Plan of Care (Insert Text)  Occupational Therapy Goals  Routine 7 day Re-eval 03/23/2017  1.  Patient will perform grooming standing with supervision/set-up 7 days.   2.  Patient will participate in anterior bathing with Min A from seated position within 7 days.  3.  Patient will perform rolling in bed with Mod A x1 in preparation for toileting and hygiene within 7 days.  4.  Patient will perform toilet transfer to bedside commode with supervision within 7 days.   5.  Patient will participate in all aspects of toileting with overall Mod A x1 within 7 days.  6.  Patient will complete upper body therapeutic exercises/activities to maximize independence and safety with ADLs for at least 5 minutes within 7 days.      Initiated 03/12/2017  1.  Patient will perform simple grooming tasks in full bed/chair position for at least 5 minutes within 7 days. Met 03/23/2017 Upgrade to standing with modified independence  2.  Patient will participate in anterior bathing with Min A from seated position within 7 days.  3.  Patient will perform rolling in bed with Mod A x1 in preparation for toileting and hygiene within 7 days. Met 03/23/2017  4.  Patient will perform toilet transfer with overall Mod A x2 and least restrictive device PRN within 7 days. Upgrade to modified independence 03/16/2017  5.  Patient will participate in all aspects of toileting with overall Mod A x1 within 7 days.  6.  Patient will complete upper body therapeutic exercises/activities to maximize independence and safety with ADLs for at least 5 minutes within 7 days.     Occupational Therapy TREATMENT  Patient: Brittney Shelton [phone] y.o. female)  Date: 03/24/2017  Diagnosis: Acute respiratory failure with hypoxia ([place]) <principal problem not specified>      Precautions: Fall, DNR  Chart, occupational therapy assessment, plan of care, and goals were reviewed.    ASSESSMENT:   Pt was seated at bedside chair upon arrival.  Pt reported that her pain was more controlled than previous session.  Pt was on 6 liters NC and 50% venti mask at 12 L.  O2 sat with activity was 100% and 97% at rest.  CGA for sit to stand from all surfaces. CGA to transfer to [facility] with Rw for support.  Stood to wipe peri anal areas with min assist for balance.  Returned to bedside chair with RW for support with CGA.  Pt reported fatigue and need to rest.    Progression toward goals:  '[]'        Improving appropriately and progressing toward goals  '[x]'        Improving slowly and progressing toward goals  '[]'        Not making progress toward goals and plan of care will be adjusted     PLAN:  Patient continues to benefit from skilled intervention to address the above impairments.  Continue treatment per established plan of care.  Discharge Recommendations:  [facility] or possible pulmonary rehab pending respiratory status and improved endurance  Further Equipment Recommendations for Discharge:  none     SUBJECTIVE:   Patient stated ???I think I need to get to the bedside commode.???    OBJECTIVE DATA SUMMARY:   Cognitive/Behavioral Status:  Neurologic State: Alert  Orientation Level: Oriented X4  Cognition: Appropriate decision making;Appropriate for age attention/concentration;Appropriate safety awareness  Perception: Appears intact  Perseveration: No perseveration  noted  Safety/Judgement: Awareness of environment;Fall prevention;Home safety  Functional Mobility and Transfers for ADLs:Bed Mobility:  Scooting: Contact guard assistance    Transfers:  Sit to Stand: Contact guard assistance;Assist x2;Additional time  Functional Transfers  Toilet Transfer : Contact guard assistance(stand pivot to and from [facility] with RW for supportr)  Bed to Chair: Contact guard assistance;Minimum assistance;Assist x2    Balance:  Sitting: Intact;Without support  Standing: Intact;With support  Standing - Static: Good;Constant support   Standing - Dynamic : Fair  ADL Intervention:       Grooming  Washing Hands: Supervision/set-up(seated )                        Toileting  Bladder Hygiene: Minimum assistance(balance standing)  Bowel Hygiene: Minimum assistance(balance standing)  Clothing Management: Moderate assistance    Cognitive Retraining  Safety/Judgement: Awareness of environment;Fall prevention;Home safety    Pain:  Pain Scale 1: Numeric (0 - 10)  Pain Intensity 1: 6  Pain Location 1: Knee  Pain Orientation 1: Left  Pain Description 1: Aching  Pain Intervention(s) 1: Nurse notified  Activity Tolerance:   Please refer to the flowsheet for vital signs taken during this treatment.  After treatment:   '[x]'  Patient left in no apparent distress sitting up in chair  '[]'  Patient left in no apparent distress in bed  '[x]'  Call bell left within reach  '[x]'  Nursing notified  '[x]'  Caregiver present  '[x]'  Bed alarm activated    COMMUNICATION/COLLABORATION:   The patient???s plan of care was discussed with: Physical Therapy Assistant and patient and her nurse    Meredeth Ide, OTR/L  Time Calculation: 14 mins

## 2017-03-24 NOTE — Progress Notes (Addendum)
0715 Bedside report received by Jamesetta OrleansEricka  Unable to wean down O2 today  Improved mobility today, sat up in chair for a couple hours  Only requested tylenol twice during the shift for pain management and no norco  Had a few occurrences of incontinence of urine when sitting without purewick  Had one bowl movement this morning  Asked her Attending for Nystatin powder for yeast growth,   Dr. Prentiss BellsPut in order  1915 gave bedside shift report to GhanaEricka

## 2017-03-24 NOTE — Progress Notes (Signed)
PCU SHIFT NURSING NOTE    Bedside and Verbal shift change report given to Cicero DuckErika, Charity fundraiserN (oncoming nurse) by Carollee HerterShannon, RN & Rosey Batheresa, RN (offgoing nurse). Report included the following information SBAR, Kardex, MAR and Recent Results.    Shift Summary:   0345: Myself and two other RN's attempted to get IV on patient 4 times due to her current IV being 8413 days old. Also attempted to get patients AM labs but after being suck 5 times patient refuses any other sticks at this time. Will consult PICC team for patients IV.  0422: Left message on PICC team nurses phone for them to see the patient in the AM to obtain and IV  0707: Bedside and Verbal shift change report given to Rosey Batheresa, RN & Carollee HerterShannon, RN (oncoming nurse) by Cicero DuckErika, RN (offgoing nurse). Report included the following information SBAR, Kardex, MAR and Recent Results.       Admission Date 03/11/2017   Admission Diagnosis Acute respiratory failure with hypoxia (HCC)   Consults IP CONSULT TO CARDIOLOGY  IP CONSULT TO PALLIATIVE CARE - PROVIDER  IP CONSULT TO INTENSIVIST  IP CONSULT TO ORTHOPEDIC SURGERY  IP CONSULT TO ORTHOPEDIC SURGERY        Consults   [] PT   [] OT   [] Speech   [] Case Management      []  Palliative      Cardiac Monitoring Order   [] Yes   [] No     IV drips   [] Yes    Drip:                            Dose:  Drip:                            Dose:  Drip:                            Dose:   [] No     GI Prophylaxis   [] Yes   [] No         DVT Prophylaxis   SCDs:             Ted stockings:         []  Medication   [] Contraindicated   [] None      Activity Level Activity Level: Up with Assistance     Activity Assistance: Complete care   Purposeful Rounding every 1-2 hour?   [] Yes   Schmidt Score  Total Score: 3   Bed Alarm (If score 3 or >)   [] Yes   []  Refused (See signed refusal form in chart)   Braden Score  Braden Score: 12   Braden Score (if score 14 or less)   [] PMT consult   [] Wound Care consult      [] Specialty bed   []  Nutrition consult           Needs prior to discharge:   Home O2 required:    [] Yes   [] No    If yes, how much O2 required?    Other:    Last Bowel Movement: Last Bowel Movement Date: 03/24/17      Influenza Vaccine Received Flu Vaccine for Current Season (usually Sept-March): No    Patient/Guardian Refused (Notify MD): Yes   Pneumonia Vaccine           Diet Active Orders   Diet    DIET CARDIAC  Regular      LDAs               Peripheral IV 03/12/17 Left Antecubital (Active)   Site Assessment Dry;Intact 03/24/2017  8:34 PM   Phlebitis Assessment 0 03/24/2017  8:34 PM   Infiltration Assessment 0 03/24/2017  8:34 PM   Dressing Status Old drainage;Intact;Dry 03/24/2017  8:34 PM   Dressing Type Tape;Transparent 03/24/2017  8:34 PM   Hub Color/Line Status Pink 03/24/2017  8:34 PM   Action Taken Open ports on tubing capped 03/22/2017  7:15 AM   Alcohol Cap Used Yes 03/22/2017  7:30 PM          External Female Catheter 03/12/17 (Active)   Site Assessment Clean, dry, & intact 03/24/2017  8:34 PM   Repositioned No 03/24/2017  8:34 PM   Perineal Care No 03/24/2017  8:34 PM   Wick Changed No 03/24/2017  8:34 PM   Suction Canister/Tubing Changed No 03/24/2017  8:34 PM   Urine Output (mL) 225 ml 03/24/2017  6:45 PM                Urinary Catheter      Intake & Output Date 03/24/17 0700 - 03/25/17 0659 03/25/17 0700 - 03/26/17 0659   Shift 0700-1859 1900-0659 24 Hour Total 0700-1859 1900-0659 24 Hour Total   INTAKE   P.O. 1420  1420        P.O. 1420  1420      Shift Total(mL/kg) 1420(11.4)  1420(11.4)      OUTPUT   Urine(mL/kg/hr) 475(0.3)  475        Urine Occurrence(s) 1 x  1 x        Urine Output (mL) (External Female Catheter 03/12/17) 475  475      Stool           Stool Occurrence(s) 1 x  1 x      Shift Total(mL/kg) 475(3.8)  475(3.8)      NET 945  945      Weight (kg) 124.2 124.2 124.2 124.2 124.2 124.2         Readmission Risk Assessment Tool Score High Risk            36       Total Score        3 Has Seen PCP in Last 6 Months (Yes=3, No=0)     3 Patient Length of Stay (>5 days = 3)    4 IP Visits Last 12 Months (1-3=4, 4=9, >4=11)    9 Pt. Coverage (Medicare=5 , Medicaid, or Self-Pay=4)    17 Charlson Comorbidity Score (Age + Comorbid Conditions)        Criteria that do not apply:    Married. Living with Significant Other. Assisted Living. LTAC. SNF. or   Rehab       Expected Length of Stay 4d 2h   Actual Length of Stay 13

## 2017-03-24 NOTE — Progress Notes (Signed)
[facility]    Name: Brittney Shelton MRN: [id number]   DOB: 1951-01-29 [facility]   Date: 03/24/2017        IMPRESSION:   ?? Acute respiratory failure, has been able to be weaned back down to 3L per NC continuously.   ?? 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 But did not like the BIPAP.   ?? Seems to be more comfortable when seen this am.  ?? Has some back pain.   ?? Pulmonary HTN  ?? Pulmonary nodules, very small.       RECOMMENDATIONS:   ?? Wean O2 to keep pox over 90%.   ?? Jet nebs  ?? Diurese as tolerated.   ?? Will need out pt pft's  ?? Will need repeat chest ct in 1 year for follow up of the pulmonary nodules.   ?? Mobilize ?? Slow progress  ?? PT/OT  ?? Will follow with you.      Subjective:     Last 24 hrs: Pt had elevated blood sugars. Pt was noted to have have moderate (left posterior location) back pain. NO coughing. Slept ok last pm.  NO abdominal pain.  Did not sleep a lot last pm due to interruptions.   Tolerating po intake well. NO leg pain, no abdominal pain.   No acute events overnight  No acute distress  No acute complaints      Current Facility-Administered Medications   Medication Dose Route Frequency   ??? insulin lispro (HUMALOG) injection   SubCUTAneous AC&HS   ??? polyethylene glycol (MIRALAX) packet 17 g  17 g Oral DAILY   ??? furosemide (LASIX) injection 40 mg  40 mg IntraVENous BID   ??? metoprolol succinate (TOPROL-XL) XL tablet 75 mg  75 mg Oral DAILY   ??? acetylcysteine (MUCOMYST) 200 mg/mL (20 %) solution 600 mg  600 mg Oral BID   ??? enoxaparin (LOVENOX) injection 40 mg  40 mg SubCUTAneous Q12H   ??? pravastatin (PRAVACHOL) tablet 20 mg  20 mg Oral QHS   ??? aspirin delayed-release tablet 81 mg  81 mg Oral DAILY   ??? methocarbamol (ROBAXIN) tablet 500 mg  500 mg Oral QID   ??? umeclidinium-vilanterol (ANORO ELLIPTA) 62.5 mcg- 25 mcg/inhalation  1 Puff Inhalation DAILY   ??? amLODIPine (NORVASC) tablet 5 mg  5 mg Oral DAILY        Review of Systems:  A comprehensive review of systems was negative except for: Respiratory: positive for dyspnea on exertion    Objective:  Vital Signs:    Visit Vitals  BP 117/67 (BP 1 Location: Right arm, BP Patient Position: At rest)   Pulse 78   Temp 97.1 ??F (36.2 ??C)   Resp 16   Ht 5\' 6"  (1.676 m)   Wt 124.2 kg (273 lb 14.4 oz)   SpO2 100%   BMI 44.21 kg/m??       O2 Device: Nasal cannula, Ventimask   O2 Flow Rate (L/min): 6 l/min   Temp (24hrs), Avg:97.5 ??F (36.4 ??C), Min:97.1 ??F (36.2 ??C), Max:97.7 ??F (36.5 ??C)     Intake/Output:   Last shift:      10/23 0701 - 10/23 1900  In: 380 [P.O.:380]  Out: 250 [Urine:250]  Last 3 shifts: 10/21 1901 - 10/23 0700  In: 1250 [P.O.:1250]  Out: 1800 [Urine:1800]    Intake/Output Summary (Last 24 hours) at 03/24/2017 1100  Last data filed at 03/24/2017 1033  Gross  per 24 hour   Intake 1270 ml   Output 1350 ml   Net -80 ml      Physical Exam:   General:  Alert, cooperative, no distress, appears stated age. Was able to wean to 3L per NC while at rest. Pox was 92-95%.    Head:  Normocephalic, without obvious abnormality, atraumatic.   Eyes:  Conjunctivae/corneas clear. PERRL, EOMs intact.   Nose: Nares normal. Septum midline. Mucosa normal. No drainage or sinus tenderness.   Throat: Lips, mucosa, and tongue normal. Teeth and gums normal.   Neck: Supple, symmetrical, trachea midline, no adenopathy, thyroid: no enlargment/tenderness/nodules, no carotid bruit and no JVD.   Back:   Symmetric, no curvature. ROM normal.   Lungs:   Clear to auscultation bilaterally. Good air movement. Comfortable.    Chest wall:  No tenderness or deformity.   Heart:  Regular rate and rhythm, S1, S2 normal, no murmur, click, rub or gallop.   Abdomen:   Soft, non-tender. Bowel sounds normal. No masses,  No organomegaly.   Extremities: Extremities normal, atraumatic, no cyanosis, ++ edema.   Pulses: 2+ and symmetric all extremities.   Skin: Skin color, texture, turgor normal. No rashes or lesions    Lymph nodes: Cervical, supraclavicular, and axillary nodes normal.   Neurologic: Grossly nonfocal, seems to have good strength of BUE and BLE.      Data review:     Recent Results (from the past 24 hour(s))   GLUCOSE, POC    Collection Time: 03/23/17 11:03 AM   Result Value Ref Range    Glucose (POC) 238 (H) 65 - 100 mg/dL    Performed by SHOOK ELIZABETH(CON)    GLUCOSE, POC    Collection Time: 03/23/17  4:24 PM   Result Value Ref Range    Glucose (POC) 157 (H) 65 - 100 mg/dL    Performed by Rebecca Eaton    GLUCOSE, POC    Collection Time: 03/23/17  9:18 PM   Result Value Ref Range    Glucose (POC) 201 (H) 65 - 100 mg/dL    Performed by [employer]    GLUCOSE, POC    Collection Time: 03/24/17  7:24 AM   Result Value Ref Range    Glucose (POC) 120 (H) 65 - 100 mg/dL    Performed by Dema Severin        Imaging:  I have personally reviewed the patient???s radiographs and have reviewed the reports:  03-11-17: Ct of chest IMPRESSION:   No evidence of acute pulmonary embolus. Unchanged enlargement of the main   pulmonary artery, compatible with pulmonary hypertension. Unchanged mediastinal   lymphadenopathy. Bilateral indeterminate, ill-defined pulmonary nodules,   measuring up to 5 mm; consider follow-up CT in 12 months to assure stability or   resolution. Additional incidental findings as detailed above.

## 2017-03-24 NOTE — Progress Notes (Signed)
Problem: Pressure Injury - Risk of  Goal: *Prevention of pressure injury  Document Braden Scale and appropriate interventions in the flowsheet.   Outcome: Progressing Towards Goal  Pressure Injury Interventions:  Sensory Interventions: Assess need for specialty bed    Moisture Interventions: Absorbent underpads    Activity Interventions: Increase time out of bed    Mobility Interventions: PT/OT evaluation    Nutrition Interventions: Document food/fluid/supplement intake    Friction and Shear Interventions: Lift team/patient mobility team

## 2017-03-24 NOTE — Progress Notes (Signed)
Problem: Mobility Impaired (Adult and Pediatric)  Goal: *Acute Goals and Plan of Care (Insert Text)  Physical Therapy Goals  Updated 03/19/2017  1.  Patient will move from supine to sit and sit to supine  in bed with modified independence within 7 day(s).    2.  Patient will transfer from bed to chair and chair to bed with modified independence using the least restrictive device within 7 day(s).  3.  Patient will perform sit to stand with modified independence within 7 day(s).  4.  Patient will ambulate with supervision/set-up for 100 feet with the least restrictive device within 7 day(s).   5.  Patient will ascend/descend 3 stairs with 1 handrail(s) with minimal assistance within 7 day(s)    Physical Therapy Goals  Initiated 03/12/2017  1.  Patient will move from supine to sit and sit to supine  in bed with modified independence within 7 day(s).    2.  Patient will transfer from bed to chair and chair to bed with modified independence using the least restrictive device within 7 day(s).  3.  Patient will perform sit to stand with modified independence within 7 day(s).  4.  Patient will ambulate with supervision/set-up for 50 feet with the least restrictive device within 7 day(s).   5.  Patient will ascend/descend 3 stairs with 1 handrail(s) with minimal assistance/contact guard assist within 7 day(s).     physical Therapy TREATMENT  Patient: Brittney Shelton [phone] y.o. female)  Date: 03/24/2017  Diagnosis: Acute respiratory failure with hypoxia [facility])      Precautions: Fall, DNR  Chart, physical therapy assessment, plan of care and goals were reviewed.    ASSESSMENT: pt very motivated today and able to amb to door and back with one sitting rest break, did well with transfers and ther-ex, amb with very flexed posture, not ready to be home alone, no LOB but still weak, vc's for safety and proper RW use.    Progression toward goals:  []     Improving appropriately and progressing toward goals   [x]     Improving very slowly and progressing toward goals  []     Not making progress toward goals and plan of care will be adjusted     PLAN:  Patient continues to benefit from skilled intervention to address the above impairments.  Continue treatment per established plan of care.  Discharge Recommendations:  Skilled Nursing Facility  Further Equipment Recommendations for Discharge:  rolling walker, BSC     OBJECTIVE DATA SUMMARY:   Critical Behavior:  Neurologic State: Alert  Orientation Level: Oriented X4  Cognition: Appropriate decision making, Appropriate for age attention/concentration, Appropriate safety awareness  Safety/Judgement: Awareness of environment, Fall prevention, Home safety     Functional Mobility Training:  Bed Mobility: Pt sitting in chair on arrival.Scooting: Contact guard assistance    Transfers:  Sit to Stand: Contact guard assistance;Assist x2;Additional time  Stand to Sit: Contact guard assistance  Bed to Chair: Contact guard assistance;Minimum assistance;Assist x2  Interventions: Tactile cues;Verbal cues  Level of Assistance: Contact guard assistance     Balance:  Sitting: Intact;Without support  Standing: Intact;With support  Standing - Static: Good;Constant support  Standing - Dynamic : FairAmbulation/Gait Training:  Distance (ft): 25 Feet (ft)  Assistive Device: Walker, rolling;Gait belt  Ambulation - Level of Assistance: Contact guard assistance;Assist x2  Gait Abnormalities: Decreased step clearance;Trunk sway increased  Right Side Weight Bearing: Full  Left Side Weight Bearing: Full  Base of Support: Widened  Speed/Cadence:  Slow;Shuffled  Step Length: Left shortened;Right shortened    Therapeutic Exercises:   sitting  EXERCISE   Sets   Reps   Active Active Assist   Passive   Comments   Ankle pumps 1 10 [x]  []  []  bilat   Heel raises 1 10 [x]  []  []  "   Toe tap 1 10 [x]  []  []  "   Knee ext 1 10 [x]  []  []  "   Hip flex 1 10 [x]  []  []  "     Pain:  Pain Scale 1: Numeric (0 - 10)   Pain Intensity 1: 6  Pain Location 1: Knee  Pain Orientation 1: Left  Pain Description 1: Aching  Pain Intervention(s) 1: Nurse notified     Activity Tolerance:     After treatment:   [x]     Patient left in no apparent distress sitting up in chair  []     Patient left in no apparent distress in bed  [x]     Call bell left within reach  [x]     Nursing notified  []     Caregiver present  [x]     Bed alarm activated    COMMUNICATION/COLLABORATION:   The patient???s plan of care was discussed with: Registered Nurse    Tamera StandsWilliam L Alvarez, PTA   Time Calculation: 25 mins

## 2017-03-25 LAB — METABOLIC PANEL, BASIC
Anion gap: 5 mmol/L (ref 5–15)
BUN/Creatinine ratio: 39 — ABNORMAL HIGH (ref 12–20)
BUN: 37 MG/DL — ABNORMAL HIGH (ref 6–20)
CO2: 38 mmol/L — ABNORMAL HIGH (ref 21–32)
Calcium: 8.6 MG/DL (ref 8.5–10.1)
Chloride: 96 mmol/L — ABNORMAL LOW (ref 97–108)
Creatinine: 0.95 MG/DL (ref 0.55–1.02)
GFR est AA: >60 mL/min/{1.73_m2} (ref >60)
GFR est non-AA: 59 mL/min/{1.73_m2} — ABNORMAL LOW (ref >60)
Glucose: 188 mg/dL — ABNORMAL HIGH (ref 65–100)
Potassium: 3.2 mmol/L — ABNORMAL LOW (ref 3.5–5.1)
Sodium: 139 mmol/L (ref 136–145)

## 2017-03-25 LAB — CBC WITH AUTOMATED DIFF
ABS. BASOPHILS: 0.1 10*3/uL (ref 0.0–0.1)
ABS. EOSINOPHILS: 0.4 10*3/uL (ref 0.0–0.4)
ABS. IMM. GRANS.: 0.1 10*3/uL — ABNORMAL HIGH (ref 0.00–0.04)
ABS. LYMPHOCYTES: 1.2 10*3/uL (ref 0.8–3.5)
ABS. MONOCYTES: 1.2 10*3/uL — ABNORMAL HIGH (ref 0.0–1.0)
ABS. NEUTROPHILS: 6.3 10*3/uL (ref 1.8–8.0)
ABSOLUTE NRBC: 0 10*3/uL (ref 0.00–0.01)
BASOPHILS: 1 % (ref 0–1)
EOSINOPHILS: 5 % (ref 0–7)
HCT: 38.7 % (ref 35.0–47.0)
HGB: 12.1 g/dL (ref 11.5–16.0)
IMMATURE GRANULOCYTES: 1 % — ABNORMAL HIGH (ref 0.0–0.5)
LYMPHOCYTES: 13 % (ref 12–49)
MCH: 27.8 PG (ref 26.0–34.0)
MCHC: 31.3 g/dL (ref 30.0–36.5)
MCV: 88.8 FL (ref 80.0–99.0)
MONOCYTES: 13 % (ref 5–13)
MPV: 13 FL — ABNORMAL HIGH (ref 8.9–12.9)
NEUTROPHILS: 69 % (ref 32–75)
NRBC: 0 PER 100 WBC
PLATELET: 254 10*3/uL (ref 150–400)
RBC: 4.36 M/uL (ref 3.80–5.20)
RDW: 18.6 % — ABNORMAL HIGH (ref 11.5–14.5)
WBC: 9.2 10*3/uL (ref 3.6–11.0)

## 2017-03-25 LAB — GLUCOSE, POC
Glucose (POC): 161 mg/dL — ABNORMAL HIGH (ref 65–100)
Glucose (POC): 117 mg/dL — ABNORMAL HIGH (ref 65–100)
Glucose (POC): 226 mg/dL — ABNORMAL HIGH (ref 65–100)
Glucose (POC): 183 mg/dL — ABNORMAL HIGH (ref 65–100)

## 2017-03-25 LAB — HEMOGLOBIN A1C WITH EAG
Est. average glucose: 137 mg/dL
Hemoglobin A1c: 6.4 % — ABNORMAL HIGH (ref 4.2–6.3)

## 2017-03-25 MED ORDER — POTASSIUM CHLORIDE SR 10 MEQ TAB
10 mEq | Freq: Two times a day (BID) | ORAL | Status: DC
Start: 2017-03-25 — End: 2017-04-04
  Administered 2017-03-25 – 2017-04-04 (×20): via ORAL

## 2017-03-25 MED ORDER — FUROSEMIDE 40 MG TAB
40 mg | Freq: Two times a day (BID) | ORAL | Status: DC
Start: 2017-03-25 — End: 2017-03-30
  Administered 2017-03-25 – 2017-03-30 (×10): via ORAL

## 2017-03-25 MED FILL — METHOCARBAMOL 500 MG TAB: 500 mg | ORAL | Qty: 1

## 2017-03-25 MED FILL — MAPAP (ACETAMINOPHEN) 325 MG TABLET: 325 mg | ORAL | Qty: 2

## 2017-03-25 MED FILL — FUROSEMIDE 10 MG/ML IJ SOLN: 10 mg/mL | INTRAMUSCULAR | Qty: 4

## 2017-03-25 MED FILL — ACETYLCYSTEINE 20 % (200 MG/ML) SOLN: 200 mg/mL (20 %) | Qty: 3

## 2017-03-25 MED FILL — PRAVASTATIN 10 MG TAB: 10 mg | ORAL | Qty: 2

## 2017-03-25 MED FILL — LOVENOX 40 MG/0.4 ML SUBCUTANEOUS SYRINGE: 40 mg/0.4 mL | SUBCUTANEOUS | Qty: 0.4

## 2017-03-25 MED FILL — FUROSEMIDE 40 MG TAB: 40 mg | ORAL | Qty: 1

## 2017-03-25 MED FILL — HEALTHYLAX 17 GRAM ORAL POWDER PACKET: 17 gram | ORAL | Qty: 1

## 2017-03-25 MED FILL — ACETYLCYSTEINE 20 % (200 MG/ML) SOLN: 200 mg/mL (20 %) | Qty: 4

## 2017-03-25 MED FILL — ASPIRIN 81 MG TAB, DELAYED RELEASE: 81 mg | ORAL | Qty: 1

## 2017-03-25 MED FILL — POTASSIUM CHLORIDE SR 10 MEQ TAB: 10 mEq | ORAL | Qty: 1

## 2017-03-25 MED FILL — AMLODIPINE 5 MG TAB: 5 mg | ORAL | Qty: 1

## 2017-03-25 MED FILL — TOPROL XL 25 MG TABLET,EXTENDED RELEASE: 25 mg | ORAL | Qty: 1

## 2017-03-25 NOTE — Progress Notes (Signed)
Bedside report given to Brittney Shelton, including SBAR, I/O, MAR, and cardiac rhythm and respiratory status. Patient came off the venturi mask today at 0930 and remained off, except for 4 minutes (after transferring from the bedside commode to the chair). Pt sat up in the chair longer today and moved around more. Pt only required acetaminophen twice during the shift and did not request Norco. Patient talked with Pallative care today, but did not make any decisions, states she wants to wait. Her brother came and saw her which she stated she enjoyed laughing with him. She was tearful after getting an IV placed but felt better after prayer with RN.

## 2017-03-25 NOTE — Progress Notes (Signed)
[place] [phone] ([phone])  [place]: [phone] ([phone])    LCSW met with patient for emotional support due to her noted frustration at times, with "not improving" and continuing to need rehab and hospitalization.    Patient was in room with her brother. She engaged easily and spoke at length about her illness, difficulty managing care at home, being unable to get up from her chair, ongoing back pain and her brother, Louretta Shorten" who she lives with, who then called the rescue squad.     Patient seems to have a hard time making decisions. At the time of this visit, she discussed how she was having a "hard time deciding if I should go back to the same rehab I was at or go to a new one". LCSW discussed with patient the pros and cons of her last rehab, what she liked, what she didn't and expressed that every rehab has it's ups and downs. She keeps noting, "we will talk about it later and I will sleep on it". Unsure if patient will make a decision, her brother, who was in the room, did not have any suggestions.     Discussed DDNR again with patient but she does not "want to sign anything, whatever will happen will happen". Her brother Jaquelyn Bitter or Randell Patient has the same opinion, he noted "when our time comes we will go". Patient is not a proactive person who wants to make a decision about this. Explained to her that without the DDNR, she would receive all attempts to bring her back to life.   Offered to leave a blank DNR but she did not want to think about that. There is likely some denial and compartmentalization to cope with her illness.     Patient in a fairly good mood, joking at times and able to engage easily with staff.     Will offer support as able.

## 2017-03-25 NOTE — Progress Notes (Signed)
Hospitalist Progress Note  NAME: Brittney Shelton   DOB:  [birth year]   MRN:  [id number]       Assessment / Plan:  Lumbar degenerative disc disese/back pain and right knee pain  XR spine, degenerative changes L3-S1, marked disc space narrowing at L4-L5, facet arthropathy L3-S1  PT/OT  S/p R knee arthrocentesis 10/21.  Appreciate ortho help with knee injections   PRN norco and tylenol for pain  Difficult to excalate pain meds due to severe COPD    Acute on chronic hypoxic respiratory failure POA with baseline 4-5L of O2  Due to Acute on chronic diastolic congestive heart failure in settings of moderate Pulmonary HTN (ProBNP with 40981 which is significantly elevated in morbidly obese pt)  COPD at baseline, doubt exacerbation with no significant cough and not wheezing  Obesity hypoventilation suspected.  Repeat Echo 10/11, EF 60 % to 65 %. There were no regional wall motion abnormalities, was moderate pulmonary hypertension.  IV steroids discontinued earlier admission by pulmonary since COPD exacerbation not likely and no improvement with steroids.   monitor I/Os, daily weights  Continue Nebs prn  Mucomyst   Completed course of Px Levaquin  Change lasix to PO  Cardiology and pulmonary consult appreciated  Cont home anoro ellipta or formulary equivalent  Required high flow O2. Now on 6L O2.  Wean to baseline 4 L NC as tolerated. Still requiring venimask.   Outpatient PFTs  Repeat Chest CT in 1 year as outpatient to follow up pulm nodules.  Palliative care was involved, pt decided DNR.     DM2 - Diet Controlled  Last A1C on record 11/2016 is 5.8. No meds listed PTA  hyperglycemia likely related to Steroids  Continue SSI for now   ??  HTN  Cont toprol and amlodipine     Hypokalemia  -supplement  ??  HLD  Cont home pravastatin    Morbid obesity - due to excess weight.    Constipation   Cont laxative    Generalized weakness  -cont PT/OT    Plan for dc to SNF once respiratory status stable and O2 weaned.  Slow progress.  ??   Code Status:   Surrogate Decision Maker: Justine Null [phone]  ??  DVT Prophylaxis: sq heparin  GI Prophylaxis: not indicated  ??  Baseline: lives at home with family     Subjective: Pt seen and examined at bedside. Knee pain improving, breathing better. Overnight events d/w RN     Chief Complaint / Reason for Physician Visit: f/u "Respiratory Failure"  Sob w/ activity. No cp/abd pain.  Minimal cough.  Some sob last night.     Review of Systems:  Symptom Y/N Comments  Symptom Y/N Comments   Fever/Chills n   Chest Pain n    Poor Appetite    Edema     Cough n   Abdominal Pain n    Sputum    Joint Pain y Knee and back pain   SOB/DOE    Pruritis/Rash     Nausea/vomit    Tolerating PT/OT     Diarrhea    Tolerating Diet     Constipation    Other       Could NOT obtain due to:      Objective:     VITALS:   Last 24hrs VS reviewed since prior progress note. Most recent are:  Patient Vitals for the past 24 hrs:   Temp Pulse Resp BP SpO2   03/25/17 1052  98 ??F (36.7 ??C) 88 20 116/52 92 %   03/25/17 0726 97.9 ??F (36.6 ??C) 78 20 134/68 99 %   03/25/17 0404 98.4 ??F (36.9 ??C) 73 16 142/55 98 %   03/24/17 2324 97.6 ??F (36.4 ??C) 83 16 136/60 98 %   03/24/17 2034 98.5 ??F (36.9 ??C) 85 16 149/71 92 %   03/24/17 1549 97.5 ??F (36.4 ??C) 71 16 140/71 100 %       Intake/Output Summary (Last 24 hours) at 03/25/2017 1429  Last data filed at 03/25/2017 1349  Gross per 24 hour   Intake 1620 ml   Output 675 ml   Net 945 ml        PHYSICAL EXAM:  General: WD, WN. Alert, cooperative, no acute distress. Sitting up in chair.  EENT:  EOMI. Anicteric sclerae. MMM  Resp:  Decreased breath sounds, no wheezing or rales.  No accessory muscle use  CV:  Regular  rhythm,?? 2+ leg edema unchanged.   GI:  Soft, Non distended, Non tender. ??+Bowel sounds  Neurologic:?? Alert and oriented X 3, normal speech, at baseline  Psych:???? Good insight.??Not anxious nor agitated  Skin:  No rashes.  No jaundice    Reviewed most current lab test results and cultures  YES   Reviewed most current radiology test results   YES  Review and summation of old records today    NO  Reviewed patient's current orders and MAR    YES  PMH/SH reviewed - no change compared to H&P  ________________________________________________________________________  Care Plan discussed with:    Comments   Patient x    Family      RN x    Care Manager     Consultant                       x Multidiciplinary team rounds were held today with case manager, nursing, pharmacist and clinical coordinator.  Patient's plan of care was discussed; medications were reviewed and discharge planning was addressed.     ________________________________________________________________________  Total NON critical care TIME: 25 Minutes    Total CRITICAL CARE TIME Spent:   Minutes non procedure based      Comments   >50% of visit spent in counseling and coordination of care     ________________________________________________________________________  Evalina Fieldichard E Manraj Yeo, MD     Procedures: see electronic medical records for all procedures/Xrays and details which were not copied into this note but were reviewed prior to creation of Plan.      LABS:  I reviewed today's most current labs and imaging studies.  Pertinent labs include:  Recent Labs     03/25/17  0923   WBC 9.2   HGB 12.1   HCT 38.7   PLT 254     Recent Labs     03/25/17  1021 03/23/17  0500   NA 139 135*   K 3.2* 3.9   CL 96* 93*   CO2 38* 34*   GLU 188* 190*   BUN 37* 34*   CREA 0.95 0.88   CA 8.6 8.4*       Signed: Evalina Fieldichard E Rashi Giuliani, MD

## 2017-03-25 NOTE — Progress Notes (Signed)
Problem: Falls - Risk of  Goal: *Absence of Falls  Document Schmid Fall Risk and appropriate interventions in the flowsheet.   Fall Risk Interventions:  Mobility Interventions: Bed/chair exit alarm         Medication Interventions: Patient to call before getting OOB    Elimination Interventions: Call light in reach             Problem: Chronic Obstructive Pulmonary Disease (COPD)  Goal: *Oxygen saturation during activity within specified parameters  Outcome: Progressing Towards Goal  Pt was able to remain off the venturi mask during the day

## 2017-03-25 NOTE — Progress Notes (Signed)
PCU SHIFT NURSING NOTE    Bedside and Verbal shift change report given to Cicero DuckErika, Charity fundraiserN (oncoming nurse) by Carollee HerterShannon, RN (offgoing nurse). Report included the following information SBAR, Kardex, MAR and Recent Results.    Shift Summary:   0725: Bedside and Verbal shift change report given to Gabriel RungMonique, Charity fundraiserN (Cabin crewoncoming nurse) by Cicero DuckErika, RN (offgoing nurse). Report included the following information SBAR, Kardex, MAR and Recent Results.       Admission Date 03/11/2017   Admission Diagnosis Acute respiratory failure with hypoxia (HCC)   Consults IP CONSULT TO CARDIOLOGY  IP CONSULT TO PALLIATIVE CARE - PROVIDER  IP CONSULT TO INTENSIVIST  IP CONSULT TO ORTHOPEDIC SURGERY  IP CONSULT TO ORTHOPEDIC SURGERY        Consults   [] PT   [] OT   [] Speech   [] Case Management      []  Palliative      Cardiac Monitoring Order   [] Yes   [] No     IV drips   [] Yes    Drip:                            Dose:  Drip:                            Dose:  Drip:                            Dose:   [] No     GI Prophylaxis   [] Yes   [] No         DVT Prophylaxis   SCDs:             Ted stockings:         []  Medication   [] Contraindicated   [] None      Activity Level Activity Level: Up with Assistance     Activity Assistance: Partial (one person)   Purposeful Rounding every 1-2 hour?   [] Yes   Noreene FilbertSchmidt Score  Total Score: 3   Bed Alarm (If score 3 or >)   [] Yes   []  Refused (See signed refusal form in chart)   Braden Score  Braden Score: 16   Braden Score (if score 14 or less)   [] PMT consult   [] Wound Care consult      [] Specialty bed   []  Nutrition consult          Needs prior to discharge:   Home O2 required:    [] Yes   [] No    If yes, how much O2 required?    Other:    Last Bowel Movement: Last Bowel Movement Date: 03/25/17      Influenza Vaccine Received Flu Vaccine for Current Season (usually Sept-March): No    Patient/Guardian Refused (Notify MD): Yes   Pneumonia Vaccine           Diet Active Orders   Diet    DIET CARDIAC Regular      LDAs                Peripheral IV 03/12/17 Left Antecubital (Active)   Site Assessment Dry;Intact 03/25/2017  8:25 PM   Phlebitis Assessment 0 03/25/2017  8:25 PM   Infiltration Assessment 0 03/25/2017  8:25 PM   Dressing Status Old drainage 03/25/2017  8:25 PM   Dressing Type Tape;Transparent 03/25/2017  8:25 PM   Hub Color/Line Status Pink  03/25/2017  8:25 PM   Action Taken Open ports on tubing capped 03/22/2017  7:15 AM   Alcohol Cap Used Yes 03/22/2017  7:30 PM          External Female Catheter 03/12/17 (Active)   Site Assessment Clean, dry, & intact 03/25/2017  8:25 PM   Repositioned No 03/25/2017  8:25 PM   Perineal Care No 03/25/2017  8:25 PM   Wick Changed No 03/25/2017  8:25 PM   Suction Canister/Tubing Changed No 03/25/2017  8:25 PM   Urine Output (mL) 225 ml 03/25/2017  6:57 PM                Urinary Catheter      Intake & Output Date 03/24/17 1900 - 03/25/17 0659 03/25/17 0700 - 03/26/17 0659   Shift 1900-0659 24 Hour Total 0700-1859 1900-0659 24 Hour Total   INTAKE   P.O. 240 1660 1060  1060     P.O. 240 1660 1060  1060   Shift Total(mL/kg) 240(1.9) 1610(96.0) 1060(8.5)  1060(8.5)   OUTPUT   Urine(mL/kg/hr) 450 925 225(0.2)  225     Urine Occurrence(s)  1 x 1 x  1 x     Urine Output (mL) (External Female Catheter 03/12/17) 450 925 225  225   Stool          Stool Occurrence(s)  1 x 1 x  1 x   Shift Total(mL/kg) 450(3.6) 925(7.4) 225(1.8)  225(1.8)   NET -210 735 835  835   Weight (kg) 125 125 125 125 125         Readmission Risk Assessment Tool Score High Risk            36       Total Score        3 Has Seen PCP in Last 6 Months (Yes=3, No=0)    3 Patient Length of Stay (>5 days = 3)    4 IP Visits Last 12 Months (1-3=4, 4=9, >4=11)    9 Pt. Coverage (Medicare=5 , Medicaid, or Self-Pay=4)    17 Charlson Comorbidity Score (Age + Comorbid Conditions)        Criteria that do not apply:    Married. Living with Significant Other. Assisted Living. LTAC. SNF. or   Rehab       Expected Length of Stay 4d 2h    Actual Length of Stay 13

## 2017-03-25 NOTE — Progress Notes (Incomplete)
0930 removed venti mask to eat, SP02 dropped down to 81% but then maintained above 92%, reaching the highest at 99%, no signs of distress.  1345 put venturi mask back on after bowl movement and then put back at 1349

## 2017-03-25 NOTE — Progress Notes (Signed)
Problem: Mobility Impaired (Adult and Pediatric)  Goal: *Acute Goals and Plan of Care (Insert Text)  Physical Therapy Goals  Updated 03/19/2017  1.  Patient will move from supine to sit and sit to supine  in bed with modified independence within 7 day(s).    2.  Patient will transfer from bed to chair and chair to bed with modified independence using the least restrictive device within 7 day(s).  3.  Patient will perform sit to stand with modified independence within 7 day(s).  4.  Patient will ambulate with supervision/set-up for 100 feet with the least restrictive device within 7 day(s).   5.  Patient will ascend/descend 3 stairs with 1 handrail(s) with minimal assistance within 7 day(s)    Physical Therapy Goals  Initiated 03/12/2017  1.  Patient will move from supine to sit and sit to supine  in bed with modified independence within 7 day(s).    2.  Patient will transfer from bed to chair and chair to bed with modified independence using the least restrictive device within 7 day(s).  3.  Patient will perform sit to stand with modified independence within 7 day(s).  4.  Patient will ambulate with supervision/set-up for 50 feet with the least restrictive device within 7 day(s).   5.  Patient will ascend/descend 3 stairs with 1 handrail(s) with minimal assistance/contact guard assist within 7 day(s).     physical Therapy TREATMENT  Patient: Brittney Shelton [phone] y.o. female)  Date: 03/25/2017  Diagnosis: Acute respiratory failure with hypoxia (HCC) <principal problem not specified>      Precautions: Fall, DNR  Chart, physical therapy assessment, plan of care and goals were reviewed.    ASSESSMENT:  Pt was received in sitting and cleared by nursing to mobilize. Very limited session due to pain in the R knee. She was able to stand 1 time with CGA/min A and additional time to come to full stand with RW. Able to stand statically ~20 seconds and then she needed to return to sitting 2/2  pain. Oxygen saturation dropped to 83% (delayed response) on 6L and recovered to 90% with rest and PLB within a few seconds. Ice was provided and nursing aware. Continue to recommend SNF.   Progression toward goals:  []     Improving appropriately and progressing toward goals  [x]     Improving slowly and progressing toward goals  []     Not making progress toward goals and plan of care will be adjusted     PLAN:  Patient continues to benefit from skilled intervention to address the above impairments.  Continue treatment per established plan of care.  Discharge Recommendations:  Skilled Nursing Facility  Further Equipment Recommendations for Discharge:  TBD     SUBJECTIVE:   Patient stated ???I need to sit.???    OBJECTIVE DATA SUMMARY:   Critical Behavior:  Neurologic State: Alert  Orientation Level: Oriented X4  Cognition: Appropriate decision making, Appropriate for age attention/concentration, Appropriate safety awareness, Follows commands  Safety/Judgement: Awareness of environment, Fall prevention, Home safety  Functional Mobility Training:  Bed Mobility:         began and ended in sitting           Transfers:  Sit to Stand: Contact guard assistance;Minimum assistance;Additional time  Stand to Sit: Contact guard assistance;Minimum assistance;Additional time                             Balance:  Sitting: Intact  Standing: Impaired  Standing - Static: Good;Constant support  Standing - Dynamic : Fair  Pain:  Pain Scale 1: Numeric (0 - 10)  Pain Intensity 1: 7  Pain Location 1: Back;Knee  Pain Orientation 1: Left  Pain Description 1: Aching  Pain Intervention(s) 1: Other (comment)(pt wants to wait for acetaminophen at breakfast)  Activity Tolerance:   Hypoxic on 6L  Please refer to the flowsheet for vital signs taken during this treatment.  After treatment:   [x]     Patient left in no apparent distress sitting up in chair  []     Patient left in no apparent distress in bed  [x]     Call bell left within reach   [x]     Nursing notified  []     Caregiver present  []     Bed alarm activated    COMMUNICATION/COLLABORATION:   The patient???s plan of care was discussed with: Occupational Therapist and Registered Nurse    Fredirick MaudlinSusannah W Jones, PT, DPT   Time Calculation: 15 mins

## 2017-03-25 NOTE — Progress Notes (Signed)
[facility], Critical Care, and Sleep Medicine    Name: Brittney Shelton MRN: [id number]   DOB: 10/20/1950 Hospital: [facility]   Date: 03/25/2017        IMPRESSION:   ?? Acute respiratory failure, has been able to be weaned back down to 3L per NC continuously.   ?? 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 But did not like the BIPAP.   ?? Seems to be more comfortable when seen this am.  ?? Has some back pain.   ?? Pulmonary HTN  ?? Pulmonary nodules, very small.       RECOMMENDATIONS:   ?? Wean O2 to keep pox over 90%. Seems better today.   ?? Jet nebs  ?? Diurese as tolerated.   ?? Will need out pt pft's  ?? Will need repeat chest ct in 1 year for follow up of the pulmonary nodules.   ?? Mobilize ?? Slow progress  ?? PT/OT  ?? Will follow with you.      Subjective:     Last 24 hrs:   Pt was note to be somewhat depressed, tearful this am. She is frustrated from her lack of progress.  NO chest pain, no back pain, no leg pain.   Pt had elevated blood sugars. Pt was noted to have have moderate (left posterior location) back pain. NO coughing. Slept ok last pm.  NO abdominal pain.  Did not sleep a lot last pm due to interruptions.   Tolerating po intake well. NO leg pain, no abdominal pain.   No acute events overnight  No acute distress  No acute complaints      Current Facility-Administered Medications   Medication Dose Route Frequency   ??? insulin lispro (HUMALOG) injection   SubCUTAneous AC&HS   ??? polyethylene glycol (MIRALAX) packet 17 g  17 g Oral DAILY   ??? furosemide (LASIX) injection 40 mg  40 mg IntraVENous BID   ??? metoprolol succinate (TOPROL-XL) XL tablet 75 mg  75 mg Oral DAILY   ??? acetylcysteine (MUCOMYST) 200 mg/mL (20 %) solution 600 mg  600 mg Oral BID   ??? enoxaparin (LOVENOX) injection 40 mg  40 mg SubCUTAneous Q12H   ??? pravastatin (PRAVACHOL) tablet 20 mg  20 mg Oral QHS   ??? aspirin delayed-release tablet 81 mg  81 mg Oral DAILY    ??? methocarbamol (ROBAXIN) tablet 500 mg  500 mg Oral QID   ??? umeclidinium-vilanterol (ANORO ELLIPTA) 62.5 mcg- 25 mcg/inhalation  1 Puff Inhalation DAILY   ??? amLODIPine (NORVASC) tablet 5 mg  5 mg Oral DAILY       Review of Systems:  A comprehensive review of systems was negative except for: Respiratory: positive for dyspnea on exertion    Objective:  Vital Signs:    Visit Vitals  BP 116/52 (BP 1 Location: Right arm, BP Patient Position: At rest)   Pulse 88   Temp 98 ??F (36.7 ??C)   Resp 20   Ht 5\' 6"  (1.676 m)   Wt 125 kg (275 lb 9.2 oz)   SpO2 92%   BMI 44.48 kg/m??       O2 Device: Nasal cannula   O2 Flow Rate (L/min): 6 l/min   Temp (24hrs), Avg:98 ??F (36.7 ??C), Min:97.5 ??F (36.4 ??C), Max:98.5 ??F (36.9 ??C)     Intake/Output:   Last shift:      10/24 0701 - 10/24 1900  In: 700 [P.O.:700]  Out: -   Last 3  shifts: 10/22 1901 - 10/24 0700  In: 1660 [P.O.:1660]  Out: 1375 [Urine:1375]    Intake/Output Summary (Last 24 hours) at 03/25/2017 1407  Last data filed at 03/25/2017 1349  Gross per 24 hour   Intake 1620 ml   Output 675 ml   Net 945 ml      Physical Exam:   General:  Alert, cooperative, no distress, appears stated age. Sitting up in chair when seen this am. Was on NC oxygen alone with pox of mid 90s on 3L    Head:  Normocephalic, without obvious abnormality, atraumatic.   Eyes:  Conjunctivae/corneas clear. PERRL, EOMs intact.   Nose: Nares normal. Septum midline. Mucosa normal. No drainage or sinus tenderness.   Throat: Lips, mucosa, and tongue normal. Teeth and gums normal.   Neck: Supple, symmetrical, trachea midline, no adenopathy, thyroid: no enlargment/tenderness/nodules, no carotid bruit and no JVD.   Back:   Symmetric, no curvature. ROM normal.   Lungs:   Clear to auscultation bilaterally. Good air movement. Comfortable. Maybe some decreased BS in bases bilaterally.     Chest wall:  No tenderness or deformity.   Heart:  Regular rate and rhythm, S1, S2 normal, no murmur, click, rub or gallop.    Abdomen:   Soft, non-tender. Bowel sounds normal. No masses,  No organomegaly.   Extremities: Extremities normal, atraumatic, no cyanosis, ++ edema.   Pulses: 2+ and symmetric all extremities.   Skin: Skin color, texture, turgor normal. No rashes or lesions   Lymph nodes: Cervical, supraclavicular, and axillary nodes normal.   Neurologic: Grossly nonfocal, seems to have good strength of BUE and BLE.      Data review:     Recent Results (from the past 24 hour(s))   GLUCOSE, POC    Collection Time: 03/24/17  4:16 PM   Result Value Ref Range    Glucose (POC) 140 (H) 65 - 100 mg/dL    Performed by Bernardo HeaterPalmore Lindsey (PCT)    GLUCOSE, POC    Collection Time: 03/24/17  9:38 PM   Result Value Ref Range    Glucose (POC) 161 (H) 65 - 100 mg/dL    Performed by Claria DiceSWINSON ERIKA M    GLUCOSE, POC    Collection Time: 03/25/17  7:16 AM   Result Value Ref Range    Glucose (POC) 117 (H) 65 - 100 mg/dL    Performed by CAUDILL MADISON(CON)    HEMOGLOBIN A1C WITH EAG    Collection Time: 03/25/17  9:23 AM   Result Value Ref Range    Hemoglobin A1c 6.4 (H) 4.2 - 6.3 %    Est. average glucose 137 mg/dL   CBC WITH AUTOMATED DIFF    Collection Time: 03/25/17  9:23 AM   Result Value Ref Range    WBC 9.2 3.6 - 11.0 K/uL    RBC 4.36 3.80 - 5.20 M/uL    HGB 12.1 11.5 - 16.0 g/dL    HCT 16.138.7 09.635.0 - 04.547.0 %    MCV 88.8 80.0 - 99.0 FL    MCH 27.8 26.0 - 34.0 PG    MCHC 31.3 30.0 - 36.5 g/dL    RDW 40.918.6 (H) 81.111.5 - 14.5 %    PLATELET 254 150 - 400 K/uL    MPV 13.0 (H) 8.9 - 12.9 FL    NRBC 0.0 0 PER 100 WBC    ABSOLUTE NRBC 0.00 0.00 - 0.01 K/uL    NEUTROPHILS 69 32 - 75 %    LYMPHOCYTES  13 12 - 49 %    MONOCYTES 13 5 - 13 %    EOSINOPHILS 5 0 - 7 %    BASOPHILS 1 0 - 1 %    IMMATURE GRANULOCYTES 1 (H) 0.0 - 0.5 %    ABS. NEUTROPHILS 6.3 1.8 - 8.0 K/UL    ABS. LYMPHOCYTES 1.2 0.8 - 3.5 K/UL    ABS. MONOCYTES 1.2 (H) 0.0 - 1.0 K/UL    ABS. EOSINOPHILS 0.4 0.0 - 0.4 K/UL    ABS. BASOPHILS 0.1 0.0 - 0.1 K/UL    ABS. IMM. GRANS. 0.1 (H) 0.00 - 0.04 K/UL     DF AUTOMATED     METABOLIC PANEL, BASIC    Collection Time: 03/25/17 10:21 AM   Result Value Ref Range    Sodium 139 136 - 145 mmol/L    Potassium 3.2 (L) 3.5 - 5.1 mmol/L    Chloride 96 (L) 97 - 108 mmol/L    CO2 38 (H) 21 - 32 mmol/L    Anion gap 5 5 - 15 mmol/L    Glucose 188 (H) 65 - 100 mg/dL    BUN 37 (H) 6 - 20 MG/DL    Creatinine 1.61 0.96 - 1.02 MG/DL    BUN/Creatinine ratio 39 (H) 12 - 20      GFR est AA >60 >60 ml/min/1.23m2    GFR est non-AA 59 (L) >60 ml/min/1.81m2    Calcium 8.6 8.5 - 10.1 MG/DL   GLUCOSE, POC    Collection Time: 03/25/17 11:13 AM   Result Value Ref Range    Glucose (POC) 226 (H) 65 - 100 mg/dL    Performed by CAUDILL MADISON(CON)        Imaging:  I have personally reviewed the patient???s radiographs and have reviewed the reports:  03-11-17: Ct of chest IMPRESSION:   No evidence of acute pulmonary embolus. Unchanged enlargement of the main   pulmonary artery, compatible with pulmonary hypertension. Unchanged mediastinal   lymphadenopathy. Bilateral indeterminate, ill-defined pulmonary nodules,   measuring up to 5 mm; consider follow-up CT in 12 months to assure stability or   resolution. Additional incidental findings as detailed above.

## 2017-03-25 NOTE — Progress Notes (Signed)
Problem: Self Care Deficits Care Plan (Adult)  Goal: *Acute Goals and Plan of Care (Insert Text)  Occupational Therapy Goals  Routine 7 day Re-eval 03/23/2017  1.  Patient will perform grooming standing with supervision/set-up 7 days.   2.  Patient will participate in anterior bathing with Min A from seated position within 7 days.  3.  Patient will perform rolling in bed with Mod A x1 in preparation for toileting and hygiene within 7 days.  4.  Patient will perform toilet transfer to bedside commode with supervision within 7 days.   5.  Patient will participate in all aspects of toileting with overall Mod A x1 within 7 days.  6.  Patient will complete upper body therapeutic exercises/activities to maximize independence and safety with ADLs for at least 5 minutes within 7 days.      Initiated 03/12/2017  1.  Patient will perform simple grooming tasks in full bed/chair position for at least 5 minutes within 7 days. Met 03/23/2017 Upgrade to standing with modified independence  2.  Patient will participate in anterior bathing with Min A from seated position within 7 days.  3.  Patient will perform rolling in bed with Mod A x1 in preparation for toileting and hygiene within 7 days. Met 03/23/2017  4.  Patient will perform toilet transfer with overall Mod A x2 and least restrictive device PRN within 7 days. Upgrade to modified independence 03/16/2017  5.  Patient will participate in all aspects of toileting with overall Mod A x1 within 7 days.  6.  Patient will complete upper body therapeutic exercises/activities to maximize independence and safety with ADLs for at least 5 minutes within 7 days.     Occupational Therapy TREATMENT  Patient: Brittney Shelton [phone] y.o. female)  Date: 03/25/2017  Diagnosis: Acute respiratory failure with hypoxia ([place]) <principal problem not specified>      Precautions: Fall, DNR  Chart, occupational therapy assessment, plan of care, and goals were reviewed.    ASSESSMENT:   Pt was seated at bedside chair upon arrival.  Pt was on 6 liters NC upon arrival with O2 sat 90-92%.  Pt reported not sleeping well last night and right knee pain.  She remains willing to partcipate but reported that she felt like she was declining medically.  CGA/min assist for sit to stand from edge of bed.  Pt stood for ~ 2 minutes statically with O2 sat on 6 liters 97-98%.  Returned to seated at bedside chair and pt was unable to tolerate further session due to fatigue.  O2 sat once seated dipped to 82% on 6 liters but with time and verbal cues for PLB pts O2 sat did improve back to 94% on the 6 liters NC.  LE elevated and ice pack applied to right knee for pain relief.  Progression toward goals:  '[]'        Improving appropriately and progressing toward goals  '[x]'        Improving slowly and progressing toward goals  '[]'        Not making progress toward goals and plan of care will be adjusted     PLAN:  Patient continues to benefit from skilled intervention to address the above impairments.  Continue treatment per established plan of care.  Discharge Recommendations:  [place]  Further Equipment Recommendations for Discharge:  none     SUBJECTIVE:   Patient stated ???I am not doing good.???    OBJECTIVE DATA SUMMARY:   Cognitive/Behavioral Status:  Neurologic State: Alert  Orientation Level: Oriented X4  Cognition: Appropriate for age attention/concentration;Appropriate safety awareness;Appropriate decision making  Perception: Appears intact  Perseveration: No perseveration noted  Safety/Judgement: Awareness of environment;Fall prevention;Home safety  Functional Mobility and Transfers for ADLs:  Transfers:  Sit to Stand: Contact guard assistance;Minimum assistance;Additional time          Balance:  Sitting: Intact  Standing: Impaired  Standing - Static: Good;Constant support  Standing - Dynamic : Fair    Cognitive Retraining  Safety/Judgement: Awareness of environment;Fall prevention;Home safety     Pain:  Pain Scale 1: Numeric (0 - 10)  Pain Intensity 1: 7  Pain Location 1: Back;Knee  Pain Orientation 1: Left  Pain Description 1: Aching  Pain Intervention(s) 1: Other (comment)(pt wants to wait for acetaminophen at breakfast)  Activity Tolerance:     Please refer to the flowsheet for vital signs taken during this treatment.  After treatment:   '[x]'  Patient left in no apparent distress sitting up in chair  '[]'  Patient left in no apparent distress in bed  '[x]'  Call bell left within reach  '[x]'  Nursing notified  '[]'  Caregiver present  '[]'  Bed alarm activated    COMMUNICATION/COLLABORATION:   The patient???s plan of care was discussed with: Physical Therapist, Registered Nurse and patient    Meredeth Ide, OTR/L  Time Calculation: 15 mins

## 2017-03-26 LAB — GLUCOSE, POC
Glucose (POC): 148 mg/dL — ABNORMAL HIGH (ref 65–100)
Glucose (POC): 124 mg/dL — ABNORMAL HIGH (ref 65–100)
Glucose (POC): 198 mg/dL — ABNORMAL HIGH (ref 65–100)
Glucose (POC): 119 mg/dL — ABNORMAL HIGH (ref 65–100)

## 2017-03-26 LAB — METABOLIC PANEL, BASIC
Anion gap: 3 mmol/L — ABNORMAL LOW (ref 5–15)
BUN/Creatinine ratio: 47 — ABNORMAL HIGH (ref 12–20)
BUN: 34 MG/DL — ABNORMAL HIGH (ref 6–20)
CO2: 38 mmol/L — ABNORMAL HIGH (ref 21–32)
Calcium: 8.3 MG/DL — ABNORMAL LOW (ref 8.5–10.1)
Chloride: 97 mmol/L (ref 97–108)
Creatinine: 0.73 MG/DL (ref 0.55–1.02)
GFR est AA: >60 mL/min/{1.73_m2} (ref >60)
GFR est non-AA: >60 mL/min/{1.73_m2} (ref >60)
Glucose: 128 mg/dL — ABNORMAL HIGH (ref 65–100)
Potassium: 3.5 mmol/L (ref 3.5–5.1)
Sodium: 138 mmol/L (ref 136–145)

## 2017-03-26 LAB — MAGNESIUM: Magnesium: 2.2 mg/dL (ref 1.6–2.4)

## 2017-03-26 MED FILL — LOVENOX 40 MG/0.4 ML SUBCUTANEOUS SYRINGE: 40 mg/0.4 mL | SUBCUTANEOUS | Qty: 0.4

## 2017-03-26 MED FILL — FUROSEMIDE 40 MG TAB: 40 mg | ORAL | Qty: 1

## 2017-03-26 MED FILL — POTASSIUM CHLORIDE SR 10 MEQ TAB: 10 mEq | ORAL | Qty: 1

## 2017-03-26 MED FILL — METHOCARBAMOL 500 MG TAB: 500 mg | ORAL | Qty: 1

## 2017-03-26 MED FILL — HYDROCODONE-ACETAMINOPHEN 5 MG-325 MG TAB: 5-325 mg | ORAL | Qty: 1

## 2017-03-26 MED FILL — AMLODIPINE 5 MG TAB: 5 mg | ORAL | Qty: 1

## 2017-03-26 MED FILL — ACETYLCYSTEINE 20 % (200 MG/ML) SOLN: 200 mg/mL (20 %) | Qty: 4

## 2017-03-26 MED FILL — MAPAP (ACETAMINOPHEN) 325 MG TABLET: 325 mg | ORAL | Qty: 2

## 2017-03-26 MED FILL — ASPIRIN 81 MG TAB, DELAYED RELEASE: 81 mg | ORAL | Qty: 1

## 2017-03-26 MED FILL — TOPROL XL 25 MG TABLET,EXTENDED RELEASE: 25 mg | ORAL | Qty: 1

## 2017-03-26 MED FILL — PRAVASTATIN 10 MG TAB: 10 mg | ORAL | Qty: 2

## 2017-03-26 MED FILL — HEALTHYLAX 17 GRAM ORAL POWDER PACKET: 17 gram | ORAL | Qty: 1

## 2017-03-26 MED FILL — ANORO ELLIPTA 62.5 MCG-25 MCG/ACTUATION POWDER FOR INHALATION: RESPIRATORY_TRACT | Qty: 14

## 2017-03-26 NOTE — Progress Notes (Signed)
[facility], Critical Care, and Sleep Medicine    Name: Brittney Shelton MRN: [id number]   DOB: [birth year] Hospital: [facility]   Date: 03/26/2017        IMPRESSION:   ?? Acute respiratory failure, has been able to be weaned back down to 3L per NC continuously. She seems to have done well over last 24 hrs. Slept ok last pm.   ?? 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 But did not like the BIPAP.   ?? Seems to be more comfortable when seen this am.  ?? Has some back pain.   ?? Pulmonary HTN  ?? Pulmonary nodules, very small.       RECOMMENDATIONS:   ?? Wean O2 to keep pox over 90%. Seems better today.   ?? Jet nebs  ?? Diurese as tolerated.   ?? Will need out pt pft's  ?? Will need repeat chest ct in 1 year for follow up of the pulmonary nodules.   ?? Mobilize ?? Slow progress  ?? PT/OT  ?? Will follow with you.      Subjective:     Last 24 hrs:  Today her major complaint is arthralgias which she gets with cold weather and rain. Tolerating po intake well.   NO chest pain, no back pain, no leg pain.   Pt had elevated blood sugars. Pt was noted to have have moderate (left posterior location) back pain. NO coughing. Slept ok last pm.  NO abdominal pain.  Did not sleep a lot last pm due to interruptions.   Tolerating po intake well. NO leg pain, no abdominal pain.   No acute events overnight  No acute distress  No acute complaints          03-25-17: Pt was note to be somewhat depressed, tearful this am. She is frustrated from her lack of progress.  NO chest pain, no back pain, no leg pain.   Pt had elevated blood sugars. Pt was noted to have have moderate (left posterior location) back pain. NO coughing. Slept ok last pm.  NO abdominal pain.  Did not sleep a lot last pm due to interruptions.   Tolerating po intake well. NO leg pain, no abdominal pain.   No acute events overnight  No acute distress  No acute complaints      Current Facility-Administered Medications   Medication Dose Route Frequency    ??? potassium chloride SR (KLOR-CON 10) tablet 10 mEq  10 mEq Oral BID   ??? furosemide (LASIX) tablet 40 mg  40 mg Oral ACB&D   ??? insulin lispro (HUMALOG) injection   SubCUTAneous AC&HS   ??? polyethylene glycol (MIRALAX) packet 17 g  17 g Oral DAILY   ??? metoprolol succinate (TOPROL-XL) XL tablet 75 mg  75 mg Oral DAILY   ??? acetylcysteine (MUCOMYST) 200 mg/mL (20 %) solution 600 mg  600 mg Oral BID   ??? enoxaparin (LOVENOX) injection 40 mg  40 mg SubCUTAneous Q12H   ??? pravastatin (PRAVACHOL) tablet 20 mg  20 mg Oral QHS   ??? aspirin delayed-release tablet 81 mg  81 mg Oral DAILY   ??? methocarbamol (ROBAXIN) tablet 500 mg  500 mg Oral QID   ??? umeclidinium-vilanterol (ANORO ELLIPTA) 62.5 mcg- 25 mcg/inhalation  1 Puff Inhalation DAILY   ??? amLODIPine (NORVASC) tablet 5 mg  5 mg Oral DAILY       Review of Systems:  A comprehensive review of systems was negative except for: Respiratory:  positive for dyspnea on exertion    Objective:  Vital Signs:    Visit Vitals  BP 125/58 (BP 1 Location: Right arm, BP Patient Position: At rest)   Pulse 85   Temp 97.6 ??F (36.4 ??C)   Resp 20   Ht 5\' 6"  (1.676 m)   Wt 123.9 kg (273 lb 2.4 oz)   SpO2 92%   BMI 44.09 kg/m??       O2 Device: Nasal cannula   O2 Flow Rate (L/min): 5 l/min   Temp (24hrs), Avg:98 ??F (36.7 ??C), Min:97.4 ??F (36.3 ??C), Max:98.7 ??F (37.1 ??C)     Intake/Output:   Last shift:      No intake/output data recorded.  Last 3 shifts: 10/23 1901 - 10/25 0700  In: 1300 [P.O.:1300]  Out: 975 [Urine:975]    Intake/Output Summary (Last 24 hours) at 03/26/2017 1243  Last data filed at 03/26/2017 0323  Gross per 24 hour   Intake 480 ml   Output 525 ml   Net -45 ml      Physical Exam:   General:  Alert, cooperative, no distress, appears stated age. Lying in bed when seen . Was on NC oxygen alone with pox of mid 90s on 3L    Head:  Normocephalic, without obvious abnormality, atraumatic.   Eyes:  Conjunctivae/corneas clear. PERRL, EOMs intact.    Nose: Nares normal. Septum midline. Mucosa normal. No drainage or sinus tenderness.   Throat: Lips, mucosa, and tongue normal. Teeth and gums normal.   Neck: Supple, symmetrical, trachea midline, no adenopathy, thyroid: no enlargment/tenderness/nodules, no carotid bruit and no JVD.   Back:   Symmetric, no curvature. ROM normal.   Lungs:   Clear to auscultation bilaterally. Good air movement. Comfortable. Maybe some decreased BS in bases bilaterally.     Chest wall:  No tenderness or deformity.   Heart:  Regular rate and rhythm, S1, S2 normal, no murmur, click, rub or gallop.   Abdomen:   Soft, non-tender. Bowel sounds normal. No masses,  No organomegaly.   Extremities: Extremities normal, atraumatic, no cyanosis, ++ edema.   Pulses: 2+ and symmetric all extremities.   Skin: Skin color, texture, turgor normal. No rashes or lesions   Lymph nodes: Cervical, supraclavicular, and axillary nodes normal.   Neurologic: Grossly nonfocal, seems to have good strength of BUE and BLE.      Data review:     Recent Results (from the past 24 hour(s))   GLUCOSE, POC    Collection Time: 03/25/17  4:13 PM   Result Value Ref Range    Glucose (POC) 183 (H) 65 - 100 mg/dL    Performed by CAUDILL MADISON(CON)    GLUCOSE, POC    Collection Time: 03/25/17  9:34 PM   Result Value Ref Range    Glucose (POC) 148 (H) 65 - 100 mg/dL    Performed by Claria Dice M    METABOLIC PANEL, BASIC    Collection Time: 03/26/17  3:31 AM   Result Value Ref Range    Sodium 138 136 - 145 mmol/L    Potassium 3.5 3.5 - 5.1 mmol/L    Chloride 97 97 - 108 mmol/L    CO2 38 (H) 21 - 32 mmol/L    Anion gap 3 (L) 5 - 15 mmol/L    Glucose 128 (H) 65 - 100 mg/dL    BUN 34 (H) 6 - 20 MG/DL    Creatinine 1.61 0.96 - 1.02 MG/DL    BUN/Creatinine  ratio 47 (H) 12 - 20      GFR est AA >60 >60 ml/min/1.9873m2    GFR est non-AA >60 >60 ml/min/1.1573m2    Calcium 8.3 (L) 8.5 - 10.1 MG/DL   MAGNESIUM    Collection Time: 03/26/17  3:31 AM   Result Value Ref Range     Magnesium 2.2 1.6 - 2.4 mg/dL   GLUCOSE, POC    Collection Time: 03/26/17  7:36 AM   Result Value Ref Range    Glucose (POC) 124 (H) 65 - 100 mg/dL    Performed by Dema SeverinBullock Gevonne    GLUCOSE, POC    Collection Time: 03/26/17 11:21 AM   Result Value Ref Range    Glucose (POC) 198 (H) 65 - 100 mg/dL    Performed by Dema SeverinBullock Gevonne        Imaging:  I have personally reviewed the patient???s radiographs and have reviewed the reports:  03-11-17: Ct of chest IMPRESSION:   No evidence of acute pulmonary embolus. Unchanged enlargement of the main   pulmonary artery, compatible with pulmonary hypertension. Unchanged mediastinal   lymphadenopathy. Bilateral indeterminate, ill-defined pulmonary nodules,   measuring up to 5 mm; consider follow-up CT in 12 months to assure stability or   resolution. Additional incidental findings as detailed above.

## 2017-03-26 NOTE — Progress Notes (Signed)
Problem: Mobility Impaired (Adult and Pediatric)  Goal: *Acute Goals and Plan of Care (Insert Text)  Physical Therapy Goals  Updated 03/19/2017  1.  Patient will move from supine to sit and sit to supine  in bed with modified independence within 7 day(s).    2.  Patient will transfer from bed to chair and chair to bed with modified independence using the least restrictive device within 7 day(s).  3.  Patient will perform sit to stand with modified independence within 7 day(s).  4.  Patient will ambulate with supervision/set-up for 100 feet with the least restrictive device within 7 day(s).   5.  Patient will ascend/descend 3 stairs with 1 handrail(s) with minimal assistance within 7 day(s)    Physical Therapy Goals  Initiated 03/12/2017  1.  Patient will move from supine to sit and sit to supine  in bed with modified independence within 7 day(s).    2.  Patient will transfer from bed to chair and chair to bed with modified independence using the least restrictive device within 7 day(s).  3.  Patient will perform sit to stand with modified independence within 7 day(s).  4.  Patient will ambulate with supervision/set-up for 50 feet with the least restrictive device within 7 day(s).   5.  Patient will ascend/descend 3 stairs with 1 handrail(s) with minimal assistance/contact guard assist within 7 day(s).     physical Therapy TREATMENT  Patient: Brittney Shelton [phone](66 y.o. female)  Date: 03/26/2017  Diagnosis: Acute respiratory failure with hypoxia (HCC) <principal problem not specified>      Precautions: Fall, DNR  Chart, physical therapy assessment, plan of care and goals were reviewed.    ASSESSMENT:  Pt was received in supine on 6L and cleared by nursing to mobilize. Pt extremely limited today due to pain and only allowing for very little movement in the bed. When attempting to adjust the bed pt needing to stop and settle pain. She has only been accepting tylenol and declining higher  pain medication per nursing report. She was only able to perform ankle pumps, AAROM knee flexion and hip abd/add, and shoulder flexion with limited ROM. HR remained 82-85bpm even when she screamed in pain... And oxygen saturation remained 94% during entire session. She declined any EOB mobility. Nursing was notified about limitations due to pain. OT did apply Biofreeze to B knees. Limited rehab potential if pain is not controlled.   Progression toward goals:  []     Improving appropriately and progressing toward goals  [x]     Improving slowly and progressing toward goals  []     Not making progress toward goals and plan of care will be adjusted     PLAN:  Patient continues to benefit from skilled intervention to address the above impairments.  Continue treatment per established plan of care.  Discharge Recommendations:  Skilled Nursing Facility  Further Equipment Recommendations for Discharge:  TBD     SUBJECTIVE:   Patient stated ???my back hurts today too.???    OBJECTIVE DATA SUMMARY:   Critical Behavior:  Neurologic State: Alert  Orientation Level: Oriented X4  Cognition: Appropriate decision making, Appropriate for age attention/concentration, Follows commands  Safety/Judgement: Awareness of environment, Fall prevention, Home safety                   Therapeutic Exercises:   Listed above 5x each exercise   Pain:  Pain Scale 1: Numeric (0 - 10)  Pain Intensity 1: 8  Pain Location  1: Hand;Back;Knee  Pain Orientation 1: Left  Pain Description 1: Aching  Pain Intervention(s) 1: Medication (see MAR)  Activity Tolerance:   VSS on 6L  Please refer to the flowsheet for vital signs taken during this treatment.  After treatment:   []     Patient left in no apparent distress sitting up in chair  [x]     Patient left in no apparent distress in bed  [x]     Call bell left within reach  [x]     Nursing notified  []     Caregiver present  []     Bed alarm activated    COMMUNICATION/COLLABORATION:    The patient???s plan of care was discussed with: Occupational Therapist and Registered Nurse    Fredirick Maudlin, PT, DPT   Time Calculation: 24 mins

## 2017-03-26 NOTE — Progress Notes (Incomplete)
Problem: Chronic Obstructive Pulmonary Disease (COPD)  Goal: *Oxygen saturation during activity within specified parameters  Outcome: Progressing Towards Goal  Pt agree to

## 2017-03-26 NOTE — Other (Signed)
Cardiopulmonary Rehab Nursing Entry:  ??  Pt is on CHF Bundle List  ??  Chart reviewed and pt visited for home CHF instruction.  Pt is a 66 yo admitted with acute on chronic respiratory and acute exacerbation of CHF, EF admission this 60-65%.  ??  Cardiac Rehab: Living with Heart Failure Booklet given to Bosie Helper during this admission.    Met with pt resting on bed NAD.  This was a follow-up visit to answer questions and reinforce prior teaching re: CHF, S&Ss, medication management, Low NA diet, daily weights and when to call the doctor. Pt is well versed in home CHF care as evidenced by teach back.    Pt verbalized understanding.

## 2017-03-26 NOTE — Progress Notes (Signed)
Hospitalist Progress Note  NAME: Brittney Shelton   DOB:  [birth year]   MRN:  [id number]       Assessment / Plan:  Lumbar degenerative disc disese/back pain and right knee pain  XR spine, degenerative changes L3-S1, marked disc space narrowing at L4-L5, facet arthropathy L3-S1  PT/OT  S/p R knee arthrocentesis 10/21.  Appreciate ortho help with knee injections   PRN norco and tylenol for pain  Difficult to excalate pain meds due to severe COPD    Acute on chronic hypoxic respiratory failure POA with baseline 4-5L of O2  Due to Acute on chronic diastolic congestive heart failure in settings of moderate Pulmonary HTN (ProBNP with 40981 which is significantly elevated in morbidly obese pt)  COPD at baseline, doubt exacerbation with no significant cough and not wheezing  Obesity hypoventilation suspected.  Repeat Echo 10/11, EF 60 % to 65 %. There were no regional wall motion abnormalities, was moderate pulmonary hypertension.  IV steroids discontinued earlier admission by pulmonary since COPD exacerbation not likely and no improvement with steroids.   monitor I/Os, daily weights  Continue Nebs prn  Mucomyst   Completed course of Px Levaquin  Change lasix to PO  Cardiology and pulmonary consult appreciated  Cont home anoro ellipta or formulary equivalent  Required high flow O2. Now on 6L O2.  Wean to baseline 4 L NC as tolerated. Still requiring venimask at times.   Outpatient PFTs  Repeat Chest CT in 1 year as outpatient to follow up pulm nodules.  Palliative care was involved, pt decided DNR.     DM2 - Diet Controlled  Last A1C on record 11/2016 is 5.8. No meds listed PTA  hyperglycemia likely related to Steroids  Continue SSI for now   ??  HTN  Cont toprol and amlodipine     Hypokalemia  -supplement  ??  HLD  Cont home pravastatin    Morbid obesity - due to excess weight.    Constipation   Cont laxative    Generalized weakness  -cont PT/OT      Plan:  As above   Plan for dc to SNF once respiratory status stable and O2 weaned.   Slow progress.  ??  Code Status:   Surrogate Decision Maker: Justine Null [phone]  ??  DVT Prophylaxis: sq heparin  GI Prophylaxis: not indicated  ??  Baseline: lives at home with family     Subjective: Pt seen and examined at bedside. Knee pain improving, breathing better. Overnight events d/w RN     Chief Complaint / Reason for Physician Visit: f/u "Respiratory Failure"  Breathing better. Slept well last night. Required oxygen mask overnight. No cp/abod pain.  Occasional pain in knees.    Review of Systems:  Symptom Y/N Comments  Symptom Y/N Comments   Fever/Chills n   Chest Pain n    Poor Appetite    Edema     Cough n   Abdominal Pain n    Sputum    Joint Pain y Knee and back pain   SOB/DOE    Pruritis/Rash     Nausea/vomit    Tolerating PT/OT     Diarrhea    Tolerating Diet     Constipation    Other       Could NOT obtain due to:      Objective:     VITALS:   Last 24hrs VS reviewed since prior progress note. Most recent are:  Patient Vitals for the past  24 hrs:   Temp Pulse Resp BP SpO2   03/26/17 0738 97.4 ??F (36.3 ??C) 86 20 126/74 90 %   03/26/17 0323 98.4 ??F (36.9 ??C) 87 20 151/74 97 %   03/25/17 2249 97.8 ??F (36.6 ??C) 91 20 154/70 98 %   03/25/17 2025 98.1 ??F (36.7 ??C) 92 20 133/65 94 %   03/25/17 1543 98.7 ??F (37.1 ??C) 83 20 120/60 93 %   03/25/17 1052 98 ??F (36.7 ??C) 88 20 116/52 92 %       Intake/Output Summary (Last 24 hours) at 03/26/2017 1016  Last data filed at 03/26/2017 0323  Gross per 24 hour   Intake 480 ml   Output 525 ml   Net -45 ml        PHYSICAL EXAM:  General: WD, WN. Alert, cooperative, no acute distress.   EENT:  EOMI. Anicteric sclerae. MMM  Resp:  Decreased breath sounds, no wheezing or rales.  No accessory muscle use  CV:  Regular  rhythm,?? 2+ leg edema unchanged.   GI:  Soft, Non distended, Non tender. ??+Bowel sounds  Neurologic:?? Alert and oriented X 3, normal speech, at baseline   Psych:???? Good insight.??Not anxious nor agitated  Skin:  No rashes.  No jaundice    Reviewed most current lab test results and cultures  YES  Reviewed most current radiology test results   YES  Review and summation of old records today    NO  Reviewed patient's current orders and MAR    YES  PMH/SH reviewed - no change compared to H&P  ________________________________________________________________________  Care Plan discussed with:    Comments   Patient x    Family      RN x    Care Manager     Consultant                       x Multidiciplinary team rounds were held today with case manager, nursing, pharmacist and clinical coordinator.  Patient's plan of care was discussed; medications were reviewed and discharge planning was addressed.     ________________________________________________________________________  Total NON critical care TIME: 25 Minutes    Total CRITICAL CARE TIME Spent:   Minutes non procedure based      Comments   >50% of visit spent in counseling and coordination of care     ________________________________________________________________________  Evalina Fieldichard E Tidus Upchurch, MD     Procedures: see electronic medical records for all procedures/Xrays and details which were not copied into this note but were reviewed prior to creation of Plan.      LABS:  I reviewed today's most current labs and imaging studies.  Pertinent labs include:  Recent Labs     03/25/17  0923   WBC 9.2   HGB 12.1   HCT 38.7   PLT 254     Recent Labs     03/26/17  0331 03/25/17  1021   NA 138 139   K 3.5 3.2*   CL 97 96*   CO2 38* 38*   GLU 128* 188*   BUN 34* 37*   CREA 0.73 0.95   CA 8.3* 8.6   MG 2.2  --        Signed: Evalina Fieldichard E Iana Buzan, MD

## 2017-03-26 NOTE — Progress Notes (Addendum)
0715?: Remove venti at 50% wearing overnight. Attempt to decrease to 5 L NC. Desat to 84%. Placed back on 6 L NC.     Entire shift 6L NC sats 84-89%. Refusing to wear venti mask entire shift.     Could not participate with PT to get out of bed due to back pain. Encouraged patient to take Norco instead of just Tylenol. Has been refusing to take anything stronger than Tyleno. Agreed to take Norco. Encouraged to get out of bed as sats were better on previous day in the chair. Unable to elevate head of bed to help oxygen saturation due to patient back pain. Prefer to lay in one spot and not move. Needs constant reminder of fluid restriction.  1830: Up to bedside commode for bowel movement. Max assist two people to stand and pivot. Bedside commode must be right next to bed.

## 2017-03-26 NOTE — Other (Signed)
DTC Progress Note    Recommendations/ Comments: If appropriate, please consider:  -Adding consistent carb to diet order  -Also may consider adding Tradjenta 5mg  daily to aid in daytime glucose control    Current hospital DM medication: Humalog high sensitivity correction    Chart reviewed on Brittney Shelton.    Patient is a 66 y.o. female with known DM  at home.    A1c:   Lab Results   Component Value Date/Time    Hemoglobin A1c 6.4 (H) 03/25/2017 09:23 AM    Hemoglobin A1c 6.2 (H) 09/12/2016 11:14 AM       Recent Glucose Results:   Lab Results   Component Value Date/Time    GLU 128 (H) 03/26/2017 03:31 AM    GLUCPOC 198 (H) 03/26/2017 11:21 AM    GLUCPOC 124 (H) 03/26/2017 07:36 AM    GLUCPOC 148 (H) 03/25/2017 09:34 PM        Lab Results   Component Value Date/Time    Creatinine 0.73 03/26/2017 03:31 AM     Estimated Creatinine Clearance: 99.1 mL/min (based on SCr of 0.73 mg/dL).    Active Orders   Diet    DIET CARDIAC Regular        PO intake:   Patient Vitals for the past 72 hrs:   % Diet Eaten   03/25/17 1859 90 %   03/25/17 1349 90 %   03/25/17 0900 95 %   03/24/17 1845 50 %   03/24/17 1139 50 %   03/24/17 1033 75 %   03/23/17 1833 95 %       Will continue to follow as needed.    Thank you    Joanie Coddingtonanielle Marlyn Rabine, RD, CDE  [facility]

## 2017-03-26 NOTE — Progress Notes (Signed)
Problem: Self Care Deficits Care Plan (Adult)  Goal: *Acute Goals and Plan of Care (Insert Text)  Occupational Therapy Goals  Routine 7 day Re-eval 03/23/2017  1.  Patient will perform grooming standing with supervision/set-up 7 days.   2.  Patient will participate in anterior bathing with Min A from seated position within 7 days.  3.  Patient will perform rolling in bed with Mod A x1 in preparation for toileting and hygiene within 7 days.  4.  Patient will perform toilet transfer to bedside commode with supervision within 7 days.   5.  Patient will participate in all aspects of toileting with overall Mod A x1 within 7 days.  6.  Patient will complete upper body therapeutic exercises/activities to maximize independence and safety with ADLs for at least 5 minutes within 7 days.      Initiated 03/12/2017  1.  Patient will perform simple grooming tasks in full bed/chair position for at least 5 minutes within 7 days. Met 03/23/2017 Upgrade to standing with modified independence  2.  Patient will participate in anterior bathing with Min A from seated position within 7 days.  3.  Patient will perform rolling in bed with Mod A x1 in preparation for toileting and hygiene within 7 days. Met 03/23/2017  4.  Patient will perform toilet transfer with overall Mod A x2 and least restrictive device PRN within 7 days. Upgrade to modified independence 03/16/2017  5.  Patient will participate in all aspects of toileting with overall Mod A x1 within 7 days.  6.  Patient will complete upper body therapeutic exercises/activities to maximize independence and safety with ADLs for at least 5 minutes within 7 days.     Occupational Therapy TREATMENT  Patient: Brittney Shelton [phone] y.o. female)  Date: 03/26/2017  Diagnosis: Acute respiratory failure with hypoxia ([place]) <principal problem not specified>      Precautions: Fall, DNR  Chart, occupational therapy assessment, plan of care, and goals were reviewed.    ASSESSMENT:   Pt was supine upon arrival and reported significant pain in knees and back. Attempted gentle ROM of BLE and AAROM LE.  Increased pain with all movement.  Attempted to get pt into chair position but pt was to painful. Pt understands the importance of mobility to reduce pain. Nurse was notfied to obtain further pain medicine for pt.  Encouraged pt to get OOB for all meals with nursing. She was able to get to [facility] multiple times yesterday.  O2 sat on 6 liters NC was 93-96% and HR was 86. Continue to recommend SNF for rehab at discharge.   Progression toward goals:  '[]'        Improving appropriately and progressing toward goals  '[x]'        Improving slowly and progressing toward goals  '[]'        Not making progress toward goals and plan of care will be adjusted     PLAN:  Patient continues to benefit from skilled intervention to address the above impairments.  Continue treatment per established plan of care.  Discharge Recommendations:  [place]  Further Equipment Recommendations for Discharge:  none     SUBJECTIVE:   Patient stated ???I will try.???    OBJECTIVE DATA SUMMARY:   Cognitive/Behavioral Status:  Neurologic State: Alert  Orientation Level: Oriented X4  Cognition: Appropriate decision making;Appropriate for age attention/concentration;Follows commands     Therapeutic Exercises:   Bilateral shoulder AROM 10 reps  AAROM/PROM bilateral knees  Rolling hips  in and out 10 repsPain:  Pain Scale 1: Numeric (0 - 10)  Pain Intensity 1: 8  Pain Location 1: Hand;Back;Knee  Pain Orientation 1: Left  Pain Description 1: Aching  Pain Intervention(s) 1: Medication (see MAR)  Activity Tolerance:     Please refer to the flowsheet for vital signs taken during this treatment.  After treatment:   '[]'  Patient left in no apparent distress sitting up in chair  '[x]'  Patient left in no apparent distress in bed  '[x]'  Call bell left within reach  '[x]'  Nursing notified  '[]'  Caregiver present  '[]'  Bed alarm activated     COMMUNICATION/COLLABORATION:   The patient???s plan of care was discussed with: Physical Therapist, Registered Nurse and patient    Meredeth Ide, OTR/L  Time Calculation: 21 mins

## 2017-03-26 NOTE — Progress Notes (Signed)
Nutrition Assessment:    INTERVENTIONS/RECOMMENDATIONS:   Meals/Snacks: General/healthful diet: Continue Cardiac diet    ASSESSMENT:   Chart reviewed; continues on a cardiac diet. Patient reports a good appetite and eating well. Getting plenty to eat and using menu and room service to order meals. Excellent PO per flowsheets; eating 75-95% of meals. Plans for d/c once respiratory status improves. No nutrition questions or concerns from patient at this time. Encouraged intake of meals.     Diet Order: Cardiac  % Eaten:    Patient Vitals for the past 72 hrs:   % Diet Eaten   03/25/17 1859 90 %   03/25/17 1349 90 %   03/25/17 0900 95 %   03/24/17 1845 50 %   03/24/17 1139 50 %   03/24/17 1033 75 %   03/23/17 1833 95 %   03/23/17 1432 75 %     Pertinent Medications: '[x]'  Reviewed '[]' Other: Norvasc, Lasix, Humalog, Miralax, KCl, Pravastatin   Pertinent Labs: '[x]' Reviewed  '[]' Other: BG [id number]  Food Allergies: '[x]' None '[]' Other:   Last BM: 10/24  '[x]' Active     '[]' Hyperactive  '[]' Hypoactive       '[]'  Absent  BS  Skin:    '[x]'  Intact   '[]'  Incision  '[]'  Breakdown   '[]' Edema   '[]' Other:  Anthropometrics: Height: '5\' 6"'  (167.6 cm) Weight: 123.9 kg (273 lb 2.4 oz)    IBW (%IBW):   ( ) UBW (%UBW):   (  %)    BMI: Body mass index is 44.09 kg/m??.    This BMI is indicative of:  '[]' Underweight   '[]' Normal   '[]' Overweight   '[]'  Obesity   '[x]'  Extreme Obesity (BMI>40)  Last Weight Metrics:  Weight Loss Metrics 03/26/2017 03/11/2017 01/06/2017 12/17/2016 12/13/2016 12/10/2016 09/12/2016   Today's Wt 273 lb 2.4 oz - 298 lb 315 lb 300 lb 300 lb 289 lb 4.8 oz   BMI - 44.09 kg/m2 48.1 kg/m2 50.84 kg/m2 48.42 kg/m2 48.42 kg/m2 46.69 kg/m2       Estimated Nutrition Needs (Based on): 1823 Kcals/day(BMR (1787) x 1.3AF -500kcal) , 99 g(0.8 g/kg bw) Protein  Carbohydrate: At Least 130 g/day  Fluids: 1800 mL/day     Pt expected to meet estimated nutrient needs: '[x]' Yes '[]' No    NUTRITION DIAGNOSES:   Problem:  No nutritional diagnosis at this time       Etiology: related to       Signs/Symptoms: as evidenced by        NUTRITION INTERVENTIONS:  Meals/Snacks: General/healthful diet                  GOAL:   PO intake >75% of meals next 5-7 days    NUTRITION MONITORING AND EVALUATION   Food/Nutrient Intake Outcomes: Total energy intake  Physical Signs/Symptoms Outcomes: Weight/weight change    Previous Goal Met:   '[x]'  Met              '[]'  Progressing Towards Goal              '[]'  Not Progressing Towards Goal   Previous Recommendations:   '[x]'  Implemented          '[]'  Not Implemented          '[]'  Not Applicable    LEARNING NEEDS (Diet, Food/Nutrient-Drug Interaction):    '[x]'  None Identified   '[]'  Identified and Education Provided/Documented   '[]'  Identified and Pt declined/was not appropriate     Cultural, Religious, OR Ethnic Dietary Needs:    [  x] None Identified   '[]'  Identified and Addressed     '[x]'  Interdisciplinary Care Plan Reviewed/Documented    '[x]'  Discharge Planning: Heart healthy diet    '[]'  Participated in Interdisciplinary Rounds    NUTRITION RISK:    '[]'  High              '[]'  Moderate           '[x]'   Low  '[]'   Minimal/Uncompromised      Rae Roam  Pager [phone]             Weekend Pager [phone]

## 2017-03-26 NOTE — Progress Notes (Signed)
Received report from Brittney Shelton, Labs, H/x, condition and SBAR covered. Pt tolerating 6L NC. -2000

## 2017-03-27 LAB — GLUCOSE, POC
Glucose (POC): 251 mg/dL — ABNORMAL HIGH (ref 65–100)
Glucose (POC): 132 mg/dL — ABNORMAL HIGH (ref 65–100)
Glucose (POC): 145 mg/dL — ABNORMAL HIGH (ref 65–100)
Glucose (POC): 124 mg/dL — ABNORMAL HIGH (ref 65–100)

## 2017-03-27 MED FILL — ACETYLCYSTEINE 20 % (200 MG/ML) SOLN: 200 mg/mL (20 %) | Qty: 4

## 2017-03-27 MED FILL — POTASSIUM CHLORIDE SR 10 MEQ TAB: 10 mEq | ORAL | Qty: 1

## 2017-03-27 MED FILL — HYDROCODONE-ACETAMINOPHEN 5 MG-325 MG TAB: 5-325 mg | ORAL | Qty: 1

## 2017-03-27 MED FILL — LOVENOX 40 MG/0.4 ML SUBCUTANEOUS SYRINGE: 40 mg/0.4 mL | SUBCUTANEOUS | Qty: 0.4

## 2017-03-27 MED FILL — TOPROL XL 25 MG TABLET,EXTENDED RELEASE: 25 mg | ORAL | Qty: 1

## 2017-03-27 MED FILL — METHOCARBAMOL 500 MG TAB: 500 mg | ORAL | Qty: 1

## 2017-03-27 MED FILL — PRAVASTATIN 10 MG TAB: 10 mg | ORAL | Qty: 2

## 2017-03-27 MED FILL — ASPIRIN 81 MG TAB, DELAYED RELEASE: 81 mg | ORAL | Qty: 1

## 2017-03-27 MED FILL — INSULIN LISPRO 100 UNIT/ML INJECTION: 100 unit/mL | SUBCUTANEOUS | Qty: 5

## 2017-03-27 MED FILL — FUROSEMIDE 40 MG TAB: 40 mg | ORAL | Qty: 1

## 2017-03-27 MED FILL — AMLODIPINE 5 MG TAB: 5 mg | ORAL | Qty: 1

## 2017-03-27 MED FILL — MAPAP (ACETAMINOPHEN) 325 MG TABLET: 325 mg | ORAL | Qty: 2

## 2017-03-27 MED FILL — HEALTHYLAX 17 GRAM ORAL POWDER PACKET: 17 gram | ORAL | Qty: 1

## 2017-03-27 NOTE — Progress Notes (Signed)
Hospitalist Progress Note  NAME: Brittney Shelton   DOB:  August 27, 1950   MRN:  [id number]       Assessment / Plan:  Lumbar degenerative disc disese/back pain and right knee pain  XR spine, degenerative changes L3-S1, marked disc space narrowing at L4-L5, facet arthropathy L3-S1  PT/OT  S/p R knee arthrocentesis 10/21.  Appreciate ortho help with knee injections   PRN norco and tylenol for pain  Difficult to excalate pain meds due to severe COPD    Acute on chronic hypoxic respiratory failure POA with baseline 4-5L of O2  Due to Acute on chronic diastolic congestive heart failure in settings of moderate Pulmonary HTN (ProBNP with 40981 which is significantly elevated in morbidly obese pt)  COPD at baseline, doubt exacerbation with no significant cough and not wheezing  Obesity hypoventilation suspected.  Repeat Echo 10/11, EF 60 % to 65 %. There were no regional wall motion abnormalities, was moderate pulmonary hypertension.  IV steroids discontinued earlier admission by pulmonary since COPD exacerbation not likely and no improvement with steroids.   monitor I/Os, daily weights  Continue Nebs prn.  Completed course of Levaquin.  Cont Lasix  Mucomyst   Cont home anoro ellipta or formulary equivalent    Required high flow O2. Now on 6L O2.  Difficulty weaning O2 to baseline of 4L due to hypoxia.  Patient does not tolerate BIPAP/CPAP due to tight fitting of mask. She has been requiring venimask at night.     Palliative care consulted. Patient decided on DNR.  Pulmonary and cardiology consulted. Consults appreciated.  Pulmonary recommends outpatient PFT's and repeat CT chest.  Outpatient PFTs    Pulmonary nodules  -repeat CT chest in 1 year    DM2 - Diet Controlled  Last A1C on record 11/2016 is 5.8. No meds listed PTA  hyperglycemia likely related to Steroids  Continue SSI for now   ??  HTN  Cont toprol and amlodipine     Hypokalemia  -supplement  ??  HLD  Cont home pravastatin    Morbid obesity - due to excess weight.     Constipation   Cont laxative    Generalized weakness  -cont PT/OT      Plan:  As above  Plan for dc to SNF once respiratory status stable.  Need to determine nighttime therapy so that O2 sats are stable.  Does not like BIPAP/CPAP.  She lives w/ her brother.  Nephew helps.  She is not married, no children.   Slow progress.  ??  Code Status:   Surrogate Decision Maker: Justine Null [phone]  ??  DVT Prophylaxis: sq heparin  GI Prophylaxis: not indicated  ??  Baseline: lives at home with family     Subjective: Pt seen and examined at bedside. Knee pain improving, breathing better. Overnight events d/w RN     Chief Complaint / Reason for Physician Visit: f/u "Respiratory Failure"  Breathing better. Slept well last night. No cp. Breathing okay at rest. No n/v/abd pain.    Review of Systems:  Symptom Y/N Comments  Symptom Y/N Comments   Fever/Chills n   Chest Pain n    Poor Appetite    Edema     Cough n   Abdominal Pain n    Sputum    Joint Pain y Knee and back pain   SOB/DOE    Pruritis/Rash     Nausea/vomit    Tolerating PT/OT     Diarrhea    Tolerating  Diet     Constipation    Other       Could NOT obtain due to:      Objective:     VITALS:   Last 24hrs VS reviewed since prior progress note. Most recent are:  Patient Vitals for the past 24 hrs:   Temp Pulse Resp BP SpO2   03/27/17 1125 97.9 ??F (36.6 ??C) 79 16 118/73 93 %   03/27/17 0723 97.5 ??F (36.4 ??C) 86 16 121/78 94 %   03/27/17 0002 97.9 ??F (36.6 ??C) 82 12 128/71 97 %   03/26/17 1910 97.8 ??F (36.6 ??C) 95 14 137/63 91 %   03/26/17 1648 97.8 ??F (36.6 ??C) 85 20 148/73 (!) 88 %       Intake/Output Summary (Last 24 hours) at 03/27/2017 1224  Last data filed at 03/27/2017 0723  Gross per 24 hour   Intake 1060 ml   Output 1150 ml   Net -90 ml        PHYSICAL EXAM:  General: WD, WN. Alert, cooperative, no acute distress.   EENT:  EOMI. Anicteric sclerae. MMM  Resp:  Decreased breath sounds, no wheezing or rales.  No accessory muscle use   CV:  Regular  rhythm,?? 2+ leg edema unchanged.   GI:  Soft, Non distended, Non tender. ??+Bowel sounds  Neurologic:?? Alert and oriented X 3, normal speech, at baseline  Psych:???? Good insight.??Not anxious nor agitated  Skin:  No rashes.  No jaundice    Reviewed most current lab test results and cultures  YES  Reviewed most current radiology test results   YES  Review and summation of old records today    NO  Reviewed patient's current orders and MAR    YES  PMH/SH reviewed - no change compared to H&P  ________________________________________________________________________  Care Plan discussed with:    Comments   Patient x    Family      RN x    Care Manager     Consultant                       x Multidiciplinary team rounds were held today with case manager, nursing, pharmacist and clinical coordinator.  Patient's plan of care was discussed; medications were reviewed and discharge planning was addressed.     ________________________________________________________________________  Total NON critical care TIME: 25 Minutes    Total CRITICAL CARE TIME Spent:   Minutes non procedure based      Comments   >50% of visit spent in counseling and coordination of care     ________________________________________________________________________  Evalina Fieldichard E Kamaron Deskins, MD     Procedures: see electronic medical records for all procedures/Xrays and details which were not copied into this note but were reviewed prior to creation of Plan.      LABS:  I reviewed today's most current labs and imaging studies.  Pertinent labs include:  Recent Labs     03/25/17  0923   WBC 9.2   HGB 12.1   HCT 38.7   PLT 254     Recent Labs     03/26/17  0331 03/25/17  1021   NA 138 139   K 3.5 3.2*   CL 97 96*   CO2 38* 38*   GLU 128* 188*   BUN 34* 37*   CREA 0.73 0.95   CA 8.3* 8.6   MG 2.2  --        Signed: Gerlene Burdockichard  Evon Slack, MD

## 2017-03-27 NOTE — Progress Notes (Signed)
[facility]    Name: Brittney Shelton MRN: [id number]   DOB: [birth year] Hospital: [facility]   Date: 03/27/2017        IMPRESSION:   ?? Acute respiratory failure, has been able to be weaned back down to 3L per NC continuously. She seems to have done well over last 24 hrs. Slept ok last pm.   ?? 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 But did not like the BIPAP. Has not used the BIPAP.   ?? Seems to be more comfortable when seen this am.  ?? Has some back pain.   ?? Pulmonary HTN  ?? Pulmonary nodules, very small.       RECOMMENDATIONS:   ?? Wean O2 to keep pox over 90%. Seems better today.   ?? Jet nebs  ?? Diurese as tolerated.   ?? Will need out pt pft's  ?? Will need repeat chest ct in 1 year for follow up of the pulmonary nodules.   ?? Mobilize ?? Slow progress  ?? PT/OT  ?? Will follow with you. Please call as needed over the weekend.      Subjective:     Last 24 hrs:Pt has been doing better. Feels breathing is stable. NO chest pain, no back pain. NO coughing. Slept ok last pm.  Has baseline arthalgias. NO leg pain,         03-26-17:   Today her major complaint is arthralgias which she gets with cold weather and rain. Tolerating po intake well.   NO chest pain, no back pain, no leg pain.   Pt had elevated blood sugars. Pt was noted to have have moderate (left posterior location) back pain. NO coughing. Slept ok last pm.  NO abdominal pain.  Did not sleep a lot last pm due to interruptions.   Tolerating po intake well. NO leg pain, no abdominal pain.   No acute events overnight  No acute distress  No acute complaints          03-25-17: Pt was note to be somewhat depressed, tearful this am. She is frustrated from her lack of progress.  NO chest pain, no back pain, no leg pain.   Pt had elevated blood sugars. Pt was noted to have have moderate (left posterior location) back pain. NO coughing. Slept ok last pm.   NO abdominal pain.  Did not sleep a lot last pm due to interruptions.   Tolerating po intake well. NO leg pain, no abdominal pain.   No acute events overnight  No acute distress  No acute complaints      Current Facility-Administered Medications   Medication Dose Route Frequency   ??? potassium chloride SR (KLOR-CON 10) tablet 10 mEq  10 mEq Oral BID   ??? furosemide (LASIX) tablet 40 mg  40 mg Oral ACB&D   ??? insulin lispro (HUMALOG) injection   SubCUTAneous AC&HS   ??? polyethylene glycol (MIRALAX) packet 17 g  17 g Oral DAILY   ??? metoprolol succinate (TOPROL-XL) XL tablet 75 mg  75 mg Oral DAILY   ??? acetylcysteine (MUCOMYST) 200 mg/mL (20 %) solution 600 mg  600 mg Oral BID   ??? enoxaparin (LOVENOX) injection 40 mg  40 mg SubCUTAneous Q12H   ??? pravastatin (PRAVACHOL) tablet 20 mg  20 mg Oral QHS   ??? aspirin delayed-release tablet 81 mg  81 mg Oral DAILY   ??? methocarbamol (ROBAXIN) tablet 500 mg  500 mg Oral  QID   ??? umeclidinium-vilanterol (ANORO ELLIPTA) 62.5 mcg- 25 mcg/inhalation  1 Puff Inhalation DAILY   ??? amLODIPine (NORVASC) tablet 5 mg  5 mg Oral DAILY       Review of Systems:  A comprehensive review of systems was negative except for: Respiratory: positive for dyspnea on exertion    Objective:  Vital Signs:    Visit Vitals  BP 121/78 (BP 1 Location: Right arm, BP Patient Position: At rest)   Pulse 86   Temp 97.5 ??F (36.4 ??C)   Resp 16   Ht 5\' 6"  (1.676 m)   Wt 124.3 kg (274 lb)   SpO2 94%   BMI 44.22 kg/m??       O2 Device: Nasal cannula   O2 Flow Rate (L/min): 6 l/min   Temp (24hrs), Avg:97.7 ??F (36.5 ??C), Min:97.5 ??F (36.4 ??C), Max:97.9 ??F (36.6 ??C)     Intake/Output:   Last shift:      10/26 0701 - 10/26 1900  In: -   Out: 150 [Urine:150]  Last 3 shifts: 10/24 1901 - 10/26 0700  In: 1060 [P.O.:1060]  Out: 1300 [Urine:1300]    Intake/Output Summary (Last 24 hours) at 03/27/2017 0951  Last data filed at 03/27/2017 0723  Gross per 24 hour   Intake 1060 ml   Output 1150 ml   Net -90 ml      Physical Exam:    General:  Alert, cooperative, no distress, appears stated age.  Sitting in chair, no distress.  Was on NC oxygen alone with pox of mid 90s on 3L    Head:  Normocephalic, without obvious abnormality, atraumatic.   Eyes:  Conjunctivae/corneas clear. PERRL, EOMs intact.   Nose: Nares normal. Septum midline. Mucosa normal. No drainage or sinus tenderness.   Throat: Lips, mucosa, and tongue normal. Teeth and gums normal.   Neck: Supple, symmetrical, trachea midline, no adenopathy, thyroid: no enlargment/tenderness/nodules, no carotid bruit and no JVD.   Back:   Symmetric, no curvature. ROM normal.   Lungs:   Clear to auscultation bilaterally. Good air movement. Comfortable. Maybe some decreased BS in bases bilaterally.     Chest wall:  No tenderness or deformity.   Heart:  Regular rate and rhythm, S1, S2 normal, no murmur, click, rub or gallop.   Abdomen:   Soft, non-tender. Bowel sounds normal. No masses,  No organomegaly.   Extremities: Extremities normal, atraumatic, no cyanosis, ++ edema.   Pulses: 2+ and symmetric all extremities.   Skin: Skin color, texture, turgor normal. No rashes or lesions   Lymph nodes: Cervical, supraclavicular, and axillary nodes normal.   Neurologic: Grossly nonfocal, seems to have good strength of BUE and BLE.      Data review:     Recent Results (from the past 24 hour(s))   GLUCOSE, POC    Collection Time: 03/26/17 11:21 AM   Result Value Ref Range    Glucose (POC) 198 (H) 65 - 100 mg/dL    Performed by Dema SeverinBullock Gevonne    GLUCOSE, POC    Collection Time: 03/26/17  4:50 PM   Result Value Ref Range    Glucose (POC) 119 (H) 65 - 100 mg/dL    Performed by Bryon LionsYEONG CAMERON(CON)    GLUCOSE, POC    Collection Time: 03/26/17  8:32 PM   Result Value Ref Range    Glucose (POC) 251 (H) 65 - 100 mg/dL    Performed by Caryn BeeJessica Harris (traveler)    GLUCOSE, POC  Collection Time: 03/27/17  7:24 AM   Result Value Ref Range    Glucose (POC) 132 (H) 65 - 100 mg/dL    Performed by Buford Dresser         Imaging:  I have personally reviewed the patient???s radiographs and have reviewed the reports:  03-11-17: Ct of chest IMPRESSION:   No evidence of acute pulmonary embolus. Unchanged enlargement of the main   pulmonary artery, compatible with pulmonary hypertension. Unchanged mediastinal   lymphadenopathy. Bilateral indeterminate, ill-defined pulmonary nodules,   measuring up to 5 mm; consider follow-up CT in 12 months to assure stability or   resolution. Additional incidental findings as detailed above.

## 2017-03-27 NOTE — Progress Notes (Signed)
Problem: Mobility Impaired (Adult and Pediatric)  Goal: *Acute Goals and Plan of Care (Insert Text)  Physical Therapy Goals    Reviewed and updated 03/27/17  1.  Patient will move from supine to sit and sit to supine  in bed with modified independence within 7 day(s).    2.  Patient will transfer from bed to chair and chair to bed with modified independence using the least restrictive device within 7 day(s).  3.  Patient will perform sit to stand with modified independence within 7 day(s).  4.  Patient will ambulate with supervision/set-up for 30 feet with the least restrictive device within 7 day(s).     Updated 03/19/2017  1.  Patient will move from supine to sit and sit to supine  in bed with modified independence within 7 day(s).    2.  Patient will transfer from bed to chair and chair to bed with modified independence using the least restrictive device within 7 day(s).  3.  Patient will perform sit to stand with modified independence within 7 day(s).  4.  Patient will ambulate with supervision/set-up for 100 feet with the least restrictive device within 7 day(s).   5.  Patient will ascend/descend 3 stairs with 1 handrail(s) with minimal assistance within 7 day(s)    Physical Therapy Goals  Initiated 03/12/2017  1.  Patient will move from supine to sit and sit to supine  in bed with modified independence within 7 day(s).    2.  Patient will transfer from bed to chair and chair to bed with modified independence using the least restrictive device within 7 day(s).  3.  Patient will perform sit to stand with modified independence within 7 day(s).  4.  Patient will ambulate with supervision/set-up for 50 feet with the least restrictive device within 7 day(s).   5.  Patient will ascend/descend 3 stairs with 1 handrail(s) with minimal assistance/contact guard assist within 7 day(s).     physical Therapy TREATMENT: WEEKLY REASSESSMENT  Patient: Brittney Shelton [phone] y.o. female)  Date: 03/27/2017   Diagnosis: Acute respiratory failure with hypoxia (HCC) <principal problem not specified>      Precautions: Fall, DNR  Chart, physical therapy assessment, plan of care and goals were reviewed.    ASSESSMENT:  Pt was received sitting up in the chair on 6L and cleared by nursing to mobilize. Reports back pain is better today. She was encouraged to place biofreeze on her knees. She needed multiple attempts to stand and eventually needed min A x 1-2 to come to full stand with RW and additional time. Stood for ~20 seconds and able to take a very small step forward on the R. She was returned to sitting. Performed LE exercises and provided red t-band to perform UE exercises as well. Continue to strongly recommend SNF at discharge. HR increased to 101bpm with standing and oxygen saturation 89-96% with activity on 6L  Patient's progression toward goals since last assessment: downgraded goals     PLAN:  Goals have been updated based on progression since last assessment.  Patient continues to benefit from skilled intervention to address the above impairments.  Continue to follow the patient 4 times a week to address goals.  Planned Interventions:  [x]               Bed Mobility Training             []        Neuromuscular Re-Education  [x]   Transfer Training                   []        Orthotic/Prosthetic Training  [x]               Gait Training                         []        Modalities  [x]               Therapeutic Exercises           []        Edema Management/Control  [x]               Therapeutic Activities            [x]        Patient and Family Training/Education  []               Other (comment):  Discharge Recommendations: Skilled Nursing Facility  Further Equipment Recommendations for Discharge: TBD     SUBJECTIVE:   Patient stated ???I like using the bands.???    OBJECTIVE DATA SUMMARY:   Critical Behavior:  Neurologic State: Alert, Appropriate for age  Orientation Level: Oriented X4   Cognition: Appropriate decision making, Follows commands  Safety/Judgement: Awareness of environment, Fall prevention, Home safety  Strength:          GW                Functional Mobility Training:  Bed Mobility:         session began and ended in sitting           Transfers:  Sit to Stand: Minimum assistance  Stand to Sit: Minimum assistance                             Balance:  Sitting: Intact  Standing: Impaired  Standing - Static: Fair;Constant support  Standing - Dynamic : FairTherapeutic Exercises:     EXERCISE   Sets   Reps   Active Active Assist   Passive   Comments   Ankle pumps 1 20 [x]                         []                         []                            LAQ 1 10 [x]                         []                         []                            Hip abd/add 1 10 [x]                         []                         []   glute set 1 10 [x]                         []                         []                            Hamstring curl 1 10 [x]                         []                         []                         Red t-band       Pain:  Pain Scale 1: Numeric (0 - 10)  Pain Intensity 1: 7  Pain Location 1: Back  Pain Orientation 1: Left  Pain Description 1: Aching  Pain Intervention(s) 1: Relaxation technique;Repositioned;Rest  Activity Tolerance:   WFL  Please refer to the flowsheet for vital signs taken during this treatment.  After treatment:   [x]   Patient left in no apparent distress sitting up in chair  []   Patient left in no apparent distress in bed  [x]   Call bell left within reach  [x]   Nursing notified  []   Caregiver present  []   Bed alarm activated    COMMUNICATION/COLLABORATION:   The patient???s plan of care was discussed with: Occupational Therapist and Registered Nurse    Fredirick Maudlin, PT, DPT   Time Calculation: 27 mins

## 2017-03-27 NOTE — Progress Notes (Signed)
Problem: Self Care Deficits Care Plan (Adult)  Goal: *Acute Goals and Plan of Care (Insert Text)  Occupational Therapy Goals  Routine 7 day Re-eval 03/23/2017  1.  Patient will perform grooming standing with supervision/set-up 7 days.   2.  Patient will participate in anterior bathing with Min A from seated position within 7 days.  3.  Patient will perform rolling in bed with Mod A x1 in preparation for toileting and hygiene within 7 days.  4.  Patient will perform toilet transfer to bedside commode with supervision within 7 days.   5.  Patient will participate in all aspects of toileting with overall Mod A x1 within 7 days.  6.  Patient will complete upper body therapeutic exercises/activities to maximize independence and safety with ADLs for at least 5 minutes within 7 days.      Initiated 03/12/2017  1.  Patient will perform simple grooming tasks in full bed/chair position for at least 5 minutes within 7 days. Met 03/23/2017 Upgrade to standing with modified independence  2.  Patient will participate in anterior bathing with Min A from seated position within 7 days.  3.  Patient will perform rolling in bed with Mod A x1 in preparation for toileting and hygiene within 7 days. Met 03/23/2017  4.  Patient will perform toilet transfer with overall Mod A x2 and least restrictive device PRN within 7 days. Upgrade to modified independence 03/16/2017  5.  Patient will participate in all aspects of toileting with overall Mod A x1 within 7 days.  6.  Patient will complete upper body therapeutic exercises/activities to maximize independence and safety with ADLs for at least 5 minutes within 7 days.     Occupational Therapy TREATMENT  Patient: Brittney Shelton [phone] y.o. female)  Date: 03/27/2017  Diagnosis: Acute respiratory failure with hypoxia ([place]) <principal problem not specified>      Precautions: Fall, DNR  Chart, occupational therapy assessment, plan of care, and goals were reviewed.    ASSESSMENT:   Nursing cleared for therapy. Patient received in chair, agreeable to attempt therapy.  Increased participation from yesterday's OT session.  Received on 6L, SpO2 88-96%, mild DOE.  Patient requesting BioFreeze for B knees, donned glove and- completed in sitting with set up. Additional time needed for patient to complete PLB and prepare self for attempt to standing.  Completes sit> stand with min AX 2 with additional time to achieve erect posture at RW level.  Standing < 20 secs prior to return to sitting.  Self limited with reports of knees "popping."  UInable to progress to functional mobility for self care transfer training.      Recommend SNF as patient is below baseline and lives home alone.  She was re-admitted to hospital shortly after dc home from SNF.  She may need to consider additional assist at home vs ALF for more support.     Recommend with nursing patient to complete as able in order to maintain strength, endurance and independence: seated ADLs with supervision/setup, OOB to chair for meals x 2 assist SPT at RW level and mobilizing to the [facility] for toileting with x2 assist at RW level. Thank you for your assistance.       Progression toward goals:  '[]'        Improving appropriately and progressing toward goals  '[x]'        Improving slowly and progressing toward goals  '[]'        Not making progress toward goals and plan  of care will be adjusted     PLAN:  Patient continues to benefit from skilled intervention to address the above impairments.  Continue treatment per established plan of care.  Discharge Recommendations:  Skilled Nursing Facility  Further Equipment Recommendations for Discharge:  TBD SNF     SUBJECTIVE:   Patient stated ???I will try.???    OBJECTIVE DATA SUMMARY:   Cognitive/Behavioral Status:  Neurologic State: Alert;Appropriate for age  Orientation Level: Oriented X4  Cognition: Appropriate decision making;Follows commands           Functional Mobility and Transfers for ADLs:Bed Mobility:   Scooting: Supervision;Additional time    Transfers:  Sit to Stand: Minimum assistance;Assist x2          Balance:  Sitting: Intact  Standing: Impaired  Standing - Static: Constant support;Fair  Standing - Dynamic : Fair  ADL Intervention:     Education on Economist, sequencing and safety with sit <> stand in order to prepare for self care transfers.  Additional time needed for patient to prepare for sit <> stand.  Attempted multiple trials without assistance, then needing min Ax 2 for sit > stand.  Standing x 20 secs however limited to progress to amb or transfer training secondary to B knee popping. Returned to sitting.  Noted urinary incontinence with PurWick in place but not working.  Changed linens in chair.  Pain:  Pain Scale 1: Numeric (0 - 10)  Pain Intensity 1: 3  Pain Location 1: Back  Pain Orientation 1: Left  Pain Description 1: Aching  Pain Intervention(s) 1: Relaxation technique;Repositioned;Rest  Activity Tolerance:   6l o2 88-96%   HR elevated to 101 with activity.     After treatment:   '[x]'  Patient left in no apparent distress sitting up in chair  '[]'  Patient left in no apparent distress in bed  '[x]'  Call bell left within reach  '[x]'  Nursing notified  '[]'  Caregiver present  '[]'  Bed alarm activated    COMMUNICATION/COLLABORATION:   The patient???s plan of care was discussed with: Physical Therapist and Registered Nurse    Seward Grater, OT  Time Calculation: 17 mins

## 2017-03-27 NOTE — Progress Notes (Signed)
Problem: Pressure Injury - Risk of  Goal: *Prevention of pressure injury  Document Braden Scale and appropriate interventions in the flowsheet.   Outcome: Progressing Towards Goal  Pressure Injury Interventions:  Sensory Interventions: Assess need for specialty bed, Chair cushion, Discuss PT/OT consult with provider, Keep linens dry and wrinkle-free, Minimize linen layers, Pad between skin to skin, Sit a 90-degree angle/use footstool if needed, Turn and reposition approx. every two hours (pillows and wedges if needed), Use 30-degree side-lying position, Pressure redistribution bed/mattress (bed type), Monitor skin under medical devices, Maintain/enhance activity level, Float heels, Check visual cues for pain, Avoid rigorous massage over bony prominences, Assess changes in LOC    Moisture Interventions: Absorbent underpads, Assess need for specialty bed, Contain wound drainage, Internal/External urinary devices, Maintain skin hydration (lotion/cream), Moisture barrier, Offer toileting Q_hr, Minimize layers, Limit adult briefs, Check for incontinence Q2 hours and as needed, Apply protective barrier, creams and emollients    Activity Interventions: Assess need for specialty bed, Pressure redistribution bed/mattress(bed type), Chair cushion, PT/OT evaluation, Increase time out of bed    Mobility Interventions: Assess need for specialty bed, Float heels, Pressure redistribution bed/mattress (bed type), Turn and reposition approx. every two hours(pillow and wedges), PT/OT evaluation, HOB 30 degrees or less, Chair cushion    Nutrition Interventions: Document food/fluid/supplement intake, Offer support with meals,snacks and hydration, Discuss nutritional consult with provider    Friction and Shear Interventions: Apply protective barrier, creams and emollients, Foam dressings/transparent film/skin sealants, Lift sheet, Minimize layers, Transferring/repositioning devices, Sit at 90-degree  angle, Lift team/patient mobility team, Feet elevated on foot rest, HOB 30 degrees or less               Problem: Falls - Risk of  Goal: *Absence of Falls  Document Schmid Fall Risk and appropriate interventions in the flowsheet.   Outcome: Progressing Towards Goal  Fall Risk Interventions:  Mobility Interventions: Bed/chair exit alarm, Assess mobility with egress test, Patient to call before getting OOB, PT Consult for assist device competence, Utilize walker, cane, or other assistive device, Utilize gait belt for transfers/ambulation, Strengthening exercises (ROM-active/passive), PT Consult for mobility concerns, OT consult for ADLs, Communicate number of staff needed for ambulation/transfer         Medication Interventions: Bed/chair exit alarm, Patient to call before getting OOB, Utilize gait belt for transfers/ambulation, Teach patient to arise slowly, Evaluate medications/consider consulting pharmacy    Elimination Interventions: Bed/chair exit alarm, Call light in reach, Elevated toilet seat, Toileting schedule/hourly rounds, Toilet paper/wipes in reach, Patient to call for help with toileting needs

## 2017-03-27 NOTE — Progress Notes (Signed)
PCU SHIFT NURSING NOTE    Bedside and Verbal shift change report given to Pecolia Ades, RN (oncoming nurse) by Caryn Bee, RN (offgoing nurse). Report included the following information SBAR, Kardex, ED Summary, Procedure Summary, Intake/Output, MAR, Recent Results, Med Rec Status, Cardiac Rhythm NSR, Alarm Parameters  and Quality Measures.    Shift Summary:         Admission Date 03/11/2017   Admission Diagnosis Acute respiratory failure with hypoxia (HCC)   Consults IP CONSULT TO CARDIOLOGY  IP CONSULT TO PALLIATIVE CARE - PROVIDER  IP CONSULT TO INTENSIVIST  IP CONSULT TO ORTHOPEDIC SURGERY  IP CONSULT TO ORTHOPEDIC SURGERY        Consults   [x] PT   [x] OT   [x] Speech   [x] Case Management      [x]  Palliative      Cardiac Monitoring Order   [x] Yes   [] No     IV drips   [] Yes    Drip:                            Dose:  Drip:                            Dose:  Drip:                            Dose:   [x] No     GI Prophylaxis   [x] Yes   [] No         DVT Prophylaxis   SCDs:             Ted stockings:         [x]  Medication   [] Contraindicated   [] None      Activity Level Activity Level: Up with Assistance     Activity Assistance: Partial (two people)   Purposeful Rounding every 1-2 hour?   [x] Yes   Schmidt Score  Total Score: 3   Bed Alarm (If score 3 or >)   [x] Yes   []  Refused (See signed refusal form in chart)   Braden Score  Braden Score: 15   Braden Score (if score 14 or less)   [x] PMT consult   [x] Wound Care consult      [x] Specialty bed   [x]  Nutrition consult          Needs prior to discharge:   Home O2 required:    [x] Yes   [] No    If yes, how much O2 required?    Other:    Last Bowel Movement: Last Bowel Movement Date: 03/26/17      Influenza Vaccine Received Flu Vaccine for Current Season (usually Sept-March): No    Patient/Guardian Refused (Notify MD): Yes   Pneumonia Vaccine           Diet Active Orders   Diet    DIET CARDIAC Regular      LDAs                Peripheral IV 03/12/17 Left Antecubital (Active)   Site Assessment Clean, dry, & intact 03/27/2017  7:23 AM   Phlebitis Assessment 0 03/27/2017  7:23 AM   Infiltration Assessment 0 03/27/2017  7:23 AM   Dressing Status Clean, dry, & intact 03/27/2017  7:23 AM   Dressing Type Tape;Transparent 03/27/2017  7:23 AM   Hub Color/Line Status Pink;Capped;Flushed 03/27/2017  7:23 AM  Action Taken Open ports on tubing capped 03/27/2017  7:23 AM   Alcohol Cap Used Yes 03/27/2017  7:23 AM          External Female Catheter 03/12/17 (Active)   Site Assessment Clean, dry, & intact 03/27/2017  7:23 AM   Repositioned No 03/27/2017  7:23 AM   Perineal Care No 03/27/2017  7:23 AM   Wick Changed No 03/27/2017  7:23 AM   Suction Canister/Tubing Changed No 03/27/2017  7:23 AM   Urine Output (mL) 150 ml 03/27/2017  7:23 AM                Urinary Catheter      Intake & Output Date 03/26/17 0700 - 03/27/17 0659 03/27/17 0700 - 03/28/17 0659   Shift 0700-1859 1900-0659 24 Hour Total 0700-1859 1900-0659 24 Hour Total   INTAKE   P.O. 1000 60 1060        P.O. 1000 60 1060      Shift Total(mL/kg) 1000(8.1) 60(0.5) 1060(8.5)      OUTPUT   Urine(mL/kg/hr) 500(0.3) 500(0.3) 1000(0.3) 150  150     Urine Voided [id number]        Urine Occurrence(s)  1 x 1 x        Urine Output (mL) (External Female Catheter 03/12/17)    150  150   Shift Total(mL/kg) 500(4) 500(4) 1000(8) 150(1.2)  150(1.2)   NET 500 -440 60 -150  -150   Weight (kg) 123.9 124.3 124.3 124.3 124.3 124.3         Readmission Risk Assessment Tool Score High Risk            36       Total Score        3 Has Seen PCP in Last 6 Months (Yes=3, No=0)    3 Patient Length of Stay (>5 days = 3)    4 IP Visits Last 12 Months (1-3=4, 4=9, >4=11)    9 Pt. Coverage (Medicare=5 , Medicaid, or Self-Pay=4)    17 Charlson Comorbidity Score (Age + Comorbid Conditions)        Criteria that do not apply:    Married. Living with Significant Other. Assisted Living. LTAC. SNF. or   Rehab        Expected Length of Stay 4d 2h   Actual Length of Stay 15

## 2017-03-28 LAB — GLUCOSE, POC
Glucose (POC): 156 mg/dL — ABNORMAL HIGH (ref 65–100)
Glucose (POC): 119 mg/dL — ABNORMAL HIGH (ref 65–100)
Glucose (POC): 158 mg/dL — ABNORMAL HIGH (ref 65–100)
Glucose (POC): 116 mg/dL — ABNORMAL HIGH (ref 65–100)

## 2017-03-28 MED FILL — FUROSEMIDE 40 MG TAB: 40 mg | ORAL | Qty: 1

## 2017-03-28 MED FILL — ASPIRIN 81 MG TAB, DELAYED RELEASE: 81 mg | ORAL | Qty: 1

## 2017-03-28 MED FILL — LOVENOX 40 MG/0.4 ML SUBCUTANEOUS SYRINGE: 40 mg/0.4 mL | SUBCUTANEOUS | Qty: 0.4

## 2017-03-28 MED FILL — METHOCARBAMOL 500 MG TAB: 500 mg | ORAL | Qty: 1

## 2017-03-28 MED FILL — HYDROCODONE-ACETAMINOPHEN 5 MG-325 MG TAB: 5-325 mg | ORAL | Qty: 1

## 2017-03-28 MED FILL — HEALTHYLAX 17 GRAM ORAL POWDER PACKET: 17 gram | ORAL | Qty: 1

## 2017-03-28 MED FILL — POTASSIUM CHLORIDE SR 10 MEQ TAB: 10 mEq | ORAL | Qty: 1

## 2017-03-28 MED FILL — TOPROL XL 25 MG TABLET,EXTENDED RELEASE: 25 mg | ORAL | Qty: 1

## 2017-03-28 MED FILL — PRAVASTATIN 10 MG TAB: 10 mg | ORAL | Qty: 2

## 2017-03-28 MED FILL — AMLODIPINE 5 MG TAB: 5 mg | ORAL | Qty: 1

## 2017-03-28 NOTE — Progress Notes (Signed)
Problem: Pressure Injury - Risk of  Goal: *Prevention of pressure injury  Document Braden Scale and appropriate interventions in the flowsheet.   Outcome: Progressing Towards Goal  Pressure Injury Interventions:  Sensory Interventions: Assess changes in LOC, Chair cushion, Float heels, Minimize linen layers, Pressure redistribution bed/mattress (bed type), Turn and reposition approx. every two hours (pillows and wedges if needed), Assess need for specialty bed, Check visual cues for pain, Keep linens dry and wrinkle-free, Monitor skin under medical devices, Sit a 90-degree angle/use footstool if needed, Use 30-degree side-lying position, Avoid rigorous massage over bony prominences, Discuss PT/OT consult with provider, Maintain/enhance activity level, Pad between skin to skin    Moisture Interventions: Absorbent underpads, Check for incontinence Q2 hours and as needed, Internal/External urinary devices, Minimize layers, Contain wound drainage, Apply protective barrier, creams and emollients, Limit adult briefs, Moisture barrier, Assess need for specialty bed, Maintain skin hydration (lotion/cream), Offer toileting Q_hr    Activity Interventions: Assess need for specialty bed, PT/OT evaluation, Chair cushion, Increase time out of bed, Pressure redistribution bed/mattress(bed type)    Mobility Interventions: Assess need for specialty bed, HOB 30 degrees or less, Chair cushion, Pressure redistribution bed/mattress (bed type), Float heels, PT/OT evaluation, Turn and reposition approx. every two hours(pillow and wedges)    Nutrition Interventions: Document food/fluid/supplement intake, Discuss nutritional consult with provider, Offer support with meals,snacks and hydration    Friction and Shear Interventions: Apply protective barrier, creams and emollients, HOB 30 degrees or less, Minimize layers, Transferring/repositioning devices, Sit at 90-degree angle, Lift sheet,  Feet elevated on foot rest, Foam dressings/transparent film/skin sealants, Lift team/patient mobility team               Problem: Falls - Risk of  Goal: *Absence of Falls  Document Schmid Fall Risk and appropriate interventions in the flowsheet.   Outcome: Progressing Towards Goal  Fall Risk Interventions:  Mobility Interventions: Bed/chair exit alarm, Assess mobility with egress test, PT Consult for mobility concerns, Utilize walker, cane, or other assistive device, Utilize gait belt for transfers/ambulation, PT Consult for assist device competence, OT consult for ADLs, Communicate number of staff needed for ambulation/transfer, Patient to call before getting OOB, Strengthening exercises (ROM-active/passive)    Mentation Interventions: Bed/chair exit alarm, Door open when patient unattended, Eyeglasses and hearing aids, Gait belt with transfers/ambulation, More frequent rounding, Self-releasing belt, Toileting rounds, Reorient patient, Familiar objects from home, Update white board, Room close to nurse's station, Increase mobility, Evaluate medications/consider consulting pharmacy, Adequate sleep, hydration, pain control    Medication Interventions: Evaluate medications/consider consulting pharmacy, Teach patient to arise slowly, Bed/chair exit alarm, Patient to call before getting OOB, Utilize gait belt for transfers/ambulation    Elimination Interventions: Bed/chair exit alarm, Toilet paper/wipes in reach, Call light in reach, Toileting schedule/hourly rounds, Elevated toilet seat, Urinal in reach, Patient to call for help with toileting needs

## 2017-03-28 NOTE — Progress Notes (Signed)
Hospitalist Progress Note  NAME: Brittney Shelton   DOB:  12/27/1950   MRN:  [id number]       Assessment / Plan:  Lumbar degenerative disc disese/back pain and right knee pain  XR spine, degenerative changes L3-S1, marked disc space narrowing at L4-L5, facet arthropathy L3-S1  PT/OT  S/p R knee arthrocentesis 10/21.  Appreciate ortho help with knee injections   PRN norco and tylenol for pain  Difficult to excalate pain meds due to severe COPD    Acute on chronic hypoxic respiratory failure POA with baseline 4-5L of O2  Due to Acute on chronic diastolic congestive heart failure in settings of moderate Pulmonary HTN (ProBNP with [id number] which is significantly elevated in morbidly obese pt)  COPD at baseline, doubt exacerbation with no significant cough and not wheezing  Obesity hypoventilation suspected.  Repeat Echo 10/11, EF 60 % to 65 %. There were no regional wall motion abnormalities, was moderate pulmonary hypertension.  IV steroids discontinued earlier admission by pulmonary since COPD exacerbation not likely and no improvement with steroids.   monitor I/Os, daily weights  Continue Nebs prn.  Completed course of Levaquin.  Cont Lasix  Mucomyst   Cont home anoro ellipta or formulary equivalent    Required high flow O2. Now on 6L O2.  Difficulty weaning O2 to baseline of 4L due to hypoxia.  Patient does not tolerate BIPAP/CPAP due to tight fitting of mask. She has been requiring venimask at night.     Palliative care consulted. Patient decided on DNR.  Pulmonary and cardiology consulted. Consults appreciated.  Pulmonary recommends outpatient PFT's and repeat CT chest.  Outpatient PFTs    Pulmonary nodules  -repeat CT chest in 1 year    DM2 - Diet Controlled  Last A1C on record 11/2016 is 5.8. No meds listed PTA  hyperglycemia likely related to Steroids  Continue SSI for now   ??  HTN  Cont toprol and amlodipine     Hypokalemia  -supplement  ??  HLD  Cont home pravastatin    Morbid obesity - due to excess weight.     Constipation   Cont laxative    Generalized weakness  -cont PT/OT      Plan:  As above  Will ask Pulmonary about CPAP or BIPAP at night.  ??  Code Status:   Surrogate Decision Maker: Justine NullShannon Boomer [phone]  ??  DVT Prophylaxis: sq heparin  GI Prophylaxis: not indicated  Baseline: lives at home with brother. Nephew nearby.  Never married. No children.     Subjective: Pt seen and examined at bedside. Knee pain improving, breathing better. Overnight events d/w RN     Chief Complaint / Reason for Physician Visit: f/u "Respiratory Failure"  Breathing better. Slept well last night. No cp. Breathing okay at rest. No n/v/abd pain.    Review of Systems:  Symptom Y/N Comments  Symptom Y/N Comments   Fever/Chills n   Chest Pain n    Poor Appetite    Edema     Cough n   Abdominal Pain n    Sputum    Joint Pain y Knee and back pain   SOB/DOE    Pruritis/Rash     Nausea/vomit    Tolerating PT/OT     Diarrhea    Tolerating Diet     Constipation    Other       Could NOT obtain due to:      Objective:     VITALS:  Last 24hrs VS reviewed since prior progress note. Most recent are:  Patient Vitals for the past 24 hrs:   Temp Pulse Resp BP SpO2   03/28/17 1107 97.8 ??F (36.6 ??C) 85 20 129/67 92 %   03/28/17 0726 97.4 ??F (36.3 ??C) 78 20 130/73 98 %   03/28/17 0340 97.8 ??F (36.6 ??C) 77 20 138/71 96 %   03/28/17 0030 98.5 ??F (36.9 ??C) 80 18 118/68 99 %   03/27/17 2000 98.5 ??F (36.9 ??C) 87 20 129/68 100 %   03/27/17 1512 98 ??F (36.7 ??C) 82 18 118/65 100 %       Intake/Output Summary (Last 24 hours) at 03/28/2017 1408  Last data filed at 03/28/2017 1107  Gross per 24 hour   Intake 440 ml   Output 1200 ml   Net -760 ml        PHYSICAL EXAM:  General: WD, WN. Alert, cooperative, no acute distress.   EENT:  EOMI. Anicteric sclerae. MMM  Resp:  Decreased breath sounds, no wheezing or rales.  No accessory muscle use  CV:  Regular  rhythm,?? 2+ leg edema unchanged.   GI:  Soft, Non distended, Non tender. ??+Bowel sounds   Neurologic:?? Alert and oriented X 3, normal speech, at baseline  Psych:???? Good insight.??Not anxious nor agitated  Skin:  No rashes.  No jaundice    Reviewed most current lab test results and cultures  YES  Reviewed most current radiology test results   YES  Review and summation of old records today    NO  Reviewed patient's current orders and MAR    YES  PMH/SH reviewed - no change compared to H&P  ________________________________________________________________________  Care Plan discussed with:    Comments   Patient x    Family      RN x    Care Manager     Consultant                        Multidiciplinary team rounds were held today with case manager, nursing, pharmacist and Higher education careers adviser.  Patient's plan of care was discussed; medications were reviewed and discharge planning was addressed.     ________________________________________________________________________  Total NON critical care TIME: 25 Minutes    Total CRITICAL CARE TIME Spent:   Minutes non procedure based      Comments   >50% of visit spent in counseling and coordination of care     ________________________________________________________________________  Evalina Field, MD     Procedures: see electronic medical records for all procedures/Xrays and details which were not copied into this note but were reviewed prior to creation of Plan.      LABS:  I reviewed today's most current labs and imaging studies.  Pertinent labs include:  No results for input(s): WBC, HGB, HCT, PLT, HGBEXT, HCTEXT, PLTEXT, HGBEXT, HCTEXT, PLTEXT in the last 72 hours.  Recent Labs     03/26/17  0331   NA 138   K 3.5   CL 97   CO2 38*   GLU 128*   BUN 34*   CREA 0.73   CA 8.3*   MG 2.2       Signed: Evalina Field, MD

## 2017-03-28 NOTE — Progress Notes (Signed)
PCU SHIFT NURSING NOTE    Bedside and Verbal shift change report given to Pecolia AdesWilliam Brooks, RN (oncoming nurse) by Reuel Boomaniel, RN (offgoing nurse). Report included the following information SBAR, Kardex, ED Summary, Procedure Summary, Intake/Output, MAR, Recent Results, Med Rec Status, Cardiac Rhythm NSR, Alarm Parameters  and Quality Measures.    Shift Summary:         Admission Date 03/11/2017   Admission Diagnosis Acute respiratory failure with hypoxia (HCC)   Consults IP CONSULT TO CARDIOLOGY  IP CONSULT TO PALLIATIVE CARE - PROVIDER  IP CONSULT TO INTENSIVIST  IP CONSULT TO ORTHOPEDIC SURGERY  IP CONSULT TO ORTHOPEDIC SURGERY        Consults   [x] PT   [x] OT   [x] Speech   [x] Case Management      [x]  Palliative      Cardiac Monitoring Order   [x] Yes   [] No     IV drips   [] Yes    Drip:                            Dose:  Drip:                            Dose:  Drip:                            Dose:   [x] No     GI Prophylaxis   [x] Yes   [] No         DVT Prophylaxis   SCDs:             Ted stockings:         [x]  Medication   [] Contraindicated   [] None      Activity Level Activity Level: Recliner     Activity Assistance: Partial (two people)   Purposeful Rounding every 1-2 hour?   [x] Yes   Noreene FilbertSchmidt Score  Total Score: 3   Bed Alarm (If score 3 or >)   [x] Yes   []  Refused (See signed refusal form in chart)   Braden Score  Braden Score: 16   Braden Score (if score 14 or less)   [x] PMT consult   [x] Wound Care consult      [x] Specialty bed   [x]  Nutrition consult          Needs prior to discharge:   Home O2 required:    [x] Yes   [] No    If yes, how much O2 required? 4 liters baseline at home    Other:    Last Bowel Movement: Last Bowel Movement Date: 03/26/17      Influenza Vaccine Received Flu Vaccine for Current Season (usually Sept-March): No    Patient/Guardian Refused (Notify MD): Yes   Pneumonia Vaccine           Diet Active Orders   Diet    DIET CARDIAC Regular      LDAs                Peripheral IV 03/12/17 Left Antecubital (Active)   Site Assessment Clean, dry, & intact 03/28/2017  7:26 AM   Phlebitis Assessment 0 03/28/2017  7:26 AM   Infiltration Assessment 0 03/28/2017  7:26 AM   Dressing Status Clean, dry, & intact 03/28/2017  7:26 AM   Dressing Type Tape;Transparent 03/28/2017  7:26 AM   Hub Color/Line Status Pink;Capped;Flushed 03/28/2017  7:26 AM  Action Taken Open ports on tubing capped 03/28/2017  7:26 AM   Alcohol Cap Used Yes 03/28/2017  7:26 AM          External Female Catheter 03/12/17 (Active)   Site Assessment Clean, dry, & intact 03/28/2017  7:26 AM   Repositioned Yes 03/28/2017  7:26 AM   Perineal Care Yes 03/28/2017  7:26 AM   Wick Changed Yes 03/28/2017  7:26 AM   Suction Canister/Tubing Changed Yes 03/28/2017  7:26 AM   Urine Output (mL) 125 ml 03/28/2017  7:26 AM                Urinary Catheter      Intake & Output Date 03/27/17 0700 - 03/28/17 0659 03/28/17 0700 - 03/29/17 0659   Shift 0700-1859 1900-0659 24 Hour Total 0700-1859 1900-0659 24 Hour Total   INTAKE   P.O.  200 200 240  240     P.O.  200 200 240  240   Shift Total(mL/kg)  200(1.6) 200(1.6) 240(1.9)  240(1.9)   OUTPUT   Urine(mL/kg/hr) 1150(0.8)  1150(0.4) 125  125     Urine Voided 500  500        Urine Output (mL) (External Female Catheter 03/12/17) 650  650 125  125   Shift Total(mL/kg) 1150(9.3)  1150(9.1) 125(1)  125(1)   NET -1150 200 -950 115  115   Weight (kg) 124.3 126.4 126.4 126.4 126.4 126.4         Readmission Risk Assessment Tool Score High Risk            36       Total Score        3 Has Seen PCP in Last 6 Months (Yes=3, No=0)    3 Patient Length of Stay (>5 days = 3)    4 IP Visits Last 12 Months (1-3=4, 4=9, >4=11)    9 Pt. Coverage (Medicare=5 , Medicaid, or Self-Pay=4)    17 Charlson Comorbidity Score (Age + Comorbid Conditions)        Criteria that do not apply:    Married. Living with Significant Other. Assisted Living. LTAC. SNF. or   Rehab       Expected Length of Stay 4d 2h    Actual Length of Stay 16

## 2017-03-28 NOTE — Progress Notes (Signed)
Patient sitting in chair at bedside.  Much eye contact, kind, friendly.  Says she is feeling a little better.  Expresses feelings of discouragement and frustration saying that improvement appears to be slow.  At the same time, she expresses hope and remains positive saying that even a little improvement is a good thing.  Has good family support and says family has been visiting regularly.  Appeared encouraged as a result of this visit and expressed gratitude for this visit.    Visited by Rev. Deeann CreeJohn Ostoich, [facility]  Chaplain paging service: [phone]

## 2017-03-29 LAB — GLUCOSE, POC
Glucose (POC): 144 mg/dL — ABNORMAL HIGH (ref 65–100)
Glucose (POC): 95 mg/dL (ref 65–100)
Glucose (POC): 121 mg/dL — ABNORMAL HIGH (ref 65–100)
Glucose (POC): 126 mg/dL — ABNORMAL HIGH (ref 65–100)

## 2017-03-29 LAB — METABOLIC PANEL, BASIC
Anion gap: 7 mmol/L (ref 5–15)
BUN/Creatinine ratio: 36 — ABNORMAL HIGH (ref 12–20)
BUN: 25 MG/DL — ABNORMAL HIGH (ref 6–20)
CO2: 32 mmol/L (ref 21–32)
Calcium: 8.2 MG/DL — ABNORMAL LOW (ref 8.5–10.1)
Chloride: 99 mmol/L (ref 97–108)
Creatinine: 0.7 MG/DL (ref 0.55–1.02)
GFR est AA: >60 mL/min/{1.73_m2} (ref >60)
GFR est non-AA: >60 mL/min/{1.73_m2} (ref >60)
Glucose: 104 mg/dL — ABNORMAL HIGH (ref 65–100)
Potassium: 4 mmol/L (ref 3.5–5.1)
Sodium: 138 mmol/L (ref 136–145)

## 2017-03-29 MED FILL — FUROSEMIDE 40 MG TAB: 40 mg | ORAL | Qty: 1

## 2017-03-29 MED FILL — LOVENOX 40 MG/0.4 ML SUBCUTANEOUS SYRINGE: 40 mg/0.4 mL | SUBCUTANEOUS | Qty: 0.4

## 2017-03-29 MED FILL — HEALTHYLAX 17 GRAM ORAL POWDER PACKET: 17 gram | ORAL | Qty: 1

## 2017-03-29 MED FILL — TOPROL XL 25 MG TABLET,EXTENDED RELEASE: 25 mg | ORAL | Qty: 1

## 2017-03-29 MED FILL — METHOCARBAMOL 500 MG TAB: 500 mg | ORAL | Qty: 1

## 2017-03-29 MED FILL — ASPIRIN 81 MG TAB, DELAYED RELEASE: 81 mg | ORAL | Qty: 1

## 2017-03-29 MED FILL — HYDROCODONE-ACETAMINOPHEN 5 MG-325 MG TAB: 5-325 mg | ORAL | Qty: 1

## 2017-03-29 MED FILL — POTASSIUM CHLORIDE SR 10 MEQ TAB: 10 mEq | ORAL | Qty: 1

## 2017-03-29 MED FILL — AMLODIPINE 5 MG TAB: 5 mg | ORAL | Qty: 1

## 2017-03-29 NOTE — Progress Notes (Signed)
Hospitalist Progress Note  NAME: Brittney Shelton   DOB:  September 19, 1950   MRN:  [id number]       Assessment / Plan:  Lumbar degenerative disc disese/back pain and right knee pain  XR spine, degenerative changes L3-S1, marked disc space narrowing at L4-L5, facet arthropathy L3-S1  PT/OT  S/p R knee arthrocentesis 10/21.  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  Repeat Echo 10/11, EF 60 % to 65 %. There were no regional wall motion abnormalities, was moderate pulmonary hypertension.  IV steroids discontinued earlier admission by pulmonary since COPD exacerbation not likely and no improvement with steroids.   monitor I/Os, daily weights  Continue Nebs prn.  Completed course of Levaquin.  Cont Lasix  Mucomyst   Cont home anoro ellipta or formulary equivalent    Required high flow O2. Now on 6L O2.  Difficulty weaning O2 to baseline of 4L due to hypoxia.  Patient does not tolerate BIPAP/CPAP due to tight fitting of mask. She has been requiring venimask at night.     Palliative care consulted. Patient decided on DNR.  Pulmonary and cardiology consulted. Consults appreciated.  Pulmonary recommends outpatient PFT's and repeat CT chest.  Outpatient PFTs    Pulmonary nodules  -repeat CT chest in 1 year    DM2 - Diet Controlled  Last A1C on record 11/2016 is 5.8. No meds listed PTA  hyperglycemia likely related to Steroids  Continue SSI for now   ??  HTN  Cont toprol and amlodipine     Hypokalemia  -supplement  ??  HLD  Cont home pravastatin    Morbid obesity - due to excess weight.     Constipation   Cont laxative    Generalized weakness  -cont PT/OT      Plan:  As above  Plan for dc to SNF soon.   Does not like BIPAP/CPAP. Will d/w case manage arranging for oximizer at home.  She lives w/ her brother.  Nephew helps.  She is not married, no children.   Slow progress.  ??  Code Status:   Surrogate Decision Maker: Justine Null [phone]  ??  DVT Prophylaxis: sq heparin  GI Prophylaxis: not indicated  ??  Baseline: lives at home with family     Subjective: Pt seen and examined at bedside. Knee pain improving, breathing better. Overnight events d/w RN     Chief Complaint / Reason for Physician Visit: f/u "Respiratory Failure"  Breathing better. Slept well last night. No cp. Breathing okay at rest. No n/v/abd pain.    Review of Systems:  Symptom Y/N Comments  Symptom Y/N Comments   Fever/Chills n   Chest Pain n    Poor Appetite    Edema     Cough n   Abdominal Pain n    Sputum    Joint Pain y Knee and back pain   SOB/DOE    Pruritis/Rash     Nausea/vomit    Tolerating PT/OT     Diarrhea    Tolerating Diet  Constipation    Other       Could NOT obtain due to:      Objective:     VITALS:   Last 24hrs VS reviewed since prior progress note. Most recent are:  Patient Vitals for the past 24 hrs:   Temp Pulse Resp BP SpO2   03/29/17 0707 97.6 ??F (36.4 ??C) 84 18 124/70 95 %   03/29/17 0530 98.7 ??F (37.1 ??C) 85 18 135/75 98 %   03/28/17 2330 98.6 ??F (37 ??C) 87 18 130/74 93 %   03/28/17 1940 97.8 ??F (36.6 ??C) 94 19 124/61 91 %   03/28/17 1749 97.8 ??F (36.6 ??C) 92 20 132/72 95 %   03/28/17 1448 97.8 ??F (36.6 ??C) 93 20 137/76 94 %       Intake/Output Summary (Last 24 hours) at 03/29/2017 1108  Last data filed at 03/29/2017 0646  Gross per 24 hour   Intake ???   Output 700 ml   Net -700 ml        PHYSICAL EXAM:  General: WD, WN. Alert, cooperative, no acute distress.   EENT:  EOMI. Anicteric sclerae. MMM  Resp:  Decreased breath sounds, no wheezing or rales.  No accessory muscle use   CV:  Regular  rhythm,?? 2+ leg edema unchanged.   GI:  Soft, Non distended, Non tender. ??+Bowel sounds  Neurologic:?? Alert and oriented X 3, normal speech, at baseline  Psych:???? Good insight.??Not anxious nor agitated  Skin:  No rashes.  No jaundice    Reviewed most current lab test results and cultures  YES  Reviewed most current radiology test results   YES  Review and summation of old records today    NO  Reviewed patient's current orders and MAR    YES  PMH/SH reviewed - no change compared to H&P  ________________________________________________________________________  Care Plan discussed with:    Comments   Patient x    Family      RN x    Care Manager     Consultant                        Multidiciplinary team rounds were held today with case manager, nursing, pharmacist and Higher education careers adviserclinical coordinator.  Patient's plan of care was discussed; medications were reviewed and discharge planning was addressed.     ________________________________________________________________________  Total NON critical care TIME: 25 Minutes    Total CRITICAL CARE TIME Spent:   Minutes non procedure based      Comments   >50% of visit spent in counseling and coordination of care     ________________________________________________________________________  Evalina Fieldichard E Kayal Mula, MD     Procedures: see electronic medical records for all procedures/Xrays and details which were not copied into this note but were reviewed prior to creation of Plan.      LABS:  I reviewed today's most current labs and imaging studies.  Pertinent labs include:  No results for input(s): WBC, HGB, HCT, PLT, HGBEXT, HCTEXT, PLTEXT, HGBEXT, HCTEXT, PLTEXT in the last 72 hours.  Recent Labs     03/29/17  0522   NA 138   K 4.0   CL 99   CO2 32   GLU 104*   BUN 25*   CREA 0.70   CA 8.2*       Signed: Evalina Fieldichard E Tamberly Pomplun, MD

## 2017-03-30 LAB — GLUCOSE, POC
Glucose (POC): 117 mg/dL — ABNORMAL HIGH (ref 65–100)
Glucose (POC): 132 mg/dL — ABNORMAL HIGH (ref 65–100)
Glucose (POC): 128 mg/dL — ABNORMAL HIGH (ref 65–100)
Glucose (POC): 110 mg/dL — ABNORMAL HIGH (ref 65–100)

## 2017-03-30 MED ORDER — FUROSEMIDE 10 MG/ML IJ SOLN
10 mg/mL | Freq: Two times a day (BID) | INTRAMUSCULAR | Status: DC
Start: 2017-03-30 — End: 2017-03-31
  Administered 2017-03-30 – 2017-03-31 (×3): via INTRAVENOUS

## 2017-03-30 MED FILL — METHOCARBAMOL 500 MG TAB: 500 mg | ORAL | Qty: 1

## 2017-03-30 MED FILL — TOPROL XL 25 MG TABLET,EXTENDED RELEASE: 25 mg | ORAL | Qty: 1

## 2017-03-30 MED FILL — PRAVASTATIN 10 MG TAB: 10 mg | ORAL | Qty: 2

## 2017-03-30 MED FILL — FUROSEMIDE 10 MG/ML IJ SOLN: 10 mg/mL | INTRAMUSCULAR | Qty: 4

## 2017-03-30 MED FILL — HYDROCODONE-ACETAMINOPHEN 5 MG-325 MG TAB: 5-325 mg | ORAL | Qty: 1

## 2017-03-30 MED FILL — HEALTHYLAX 17 GRAM ORAL POWDER PACKET: 17 gram | ORAL | Qty: 1

## 2017-03-30 MED FILL — POTASSIUM CHLORIDE SR 10 MEQ TAB: 10 mEq | ORAL | Qty: 1

## 2017-03-30 MED FILL — AMLODIPINE 5 MG TAB: 5 mg | ORAL | Qty: 1

## 2017-03-30 MED FILL — LOVENOX 40 MG/0.4 ML SUBCUTANEOUS SYRINGE: 40 mg/0.4 mL | SUBCUTANEOUS | Qty: 0.4

## 2017-03-30 MED FILL — ASPIRIN 81 MG TAB, DELAYED RELEASE: 81 mg | ORAL | Qty: 1

## 2017-03-30 NOTE — Progress Notes (Signed)
Chart reviewed for updates and attempted to see patient for OT treatment. Patient is presently being seen by music therapist. Will follow up tomorrow for OT treatment.

## 2017-03-30 NOTE — Progress Notes (Signed)
Hospitalist Progress Note  NAME: Brittney Shelton   DOB:  01/30/1951   MRN:  [id number]       Assessment / Plan:  Lumbar degenerative disc disese/back pain and right knee pain  XR spine, degenerative changes L3-S1, marked disc space narrowing at L4-L5, facet arthropathy L3-S1  PT/OT  S/p R knee arthrocentesis 10/21.  Appreciate ortho help with knee injections   PRN norco and tylenol for pain  Difficult to excalate pain meds due to severe COPD    Acute on chronic hypoxic respiratory failure POA with baseline 4-5L of O2  Due to Acute on chronic diastolic congestive heart failure in settings of moderate Pulmonary HTN (ProBNP with [id number] which is significantly elevated in morbidly obese pt)    Obesity hypoventilation suspected.  No copd per pulmonary.  PFT's done 2016 w/o airway obstruction and only smoked 1-2 years total.    Repeat Echo 10/11, EF 60 % to 65 %. There were no regional wall motion abnormalities, was moderate pulmonary hypertension.  IV steroids discontinued earlier admission by pulmonary since COPD exacerbation not likely and no improvement with steroids.   monitor I/Os, daily weights  Continue Nebs prn.  Completed course of Levaquin.  Cont Lasix  Mucomyst   Cont home anoro ellipta or formulary equivalent    Required high flow O2. Now on 6L O2.  Difficulty weaning O2 to baseline of 4L due to hypoxia.  Patient does not tolerate BIPAP/CPAP due to tight fitting of mask. She has been requiring venimask at night.     Palliative care consulted. Patient decided on DNR.  Pulmonary and cardiology consulted. Consults appreciated.  Pulmonary recommends outpatient PFT's and repeat CT chest.  Outpatient PFTs    Pulmonary nodules  -repeat CT chest in 1 year    DM2 - Diet Controlled  Last A1C on record 11/2016 is 5.8. No meds listed PTA  hyperglycemia likely related to Steroids  Continue SSI for now   ??  HTN  Cont toprol and amlodipine     Hypokalemia  -supplement  ??  HLD  Cont home pravastatin     Morbid obesity - due to excess weight.    Constipation   Cont laxative    Generalized weakness  -cont PT/OT      Plan:  As above  Plan for dc to SNF soon.   Does not like BIPAP/CPAP. Will d/w case manage arranging for oximizer at home.  She lives w/ her brother.  Nephew helps.  She is not married, no children.   Slow progress.  ??  Code Status:   Surrogate Decision Maker: Justine NullShannon Boomer [phone]  ??  DVT Prophylaxis: sq heparin  GI Prophylaxis: not indicated  ??  Baseline: lives at home with family     Subjective: Pt seen and examined at bedside. Knee pain improving, breathing better. Overnight events d/w RN     Chief Complaint / Reason for Physician Visit: f/u "Respiratory Failure"  Breathing better. Slept well last night. No cp. Breathing okay at rest. No n/v/abd pain.    Review of Systems:  Symptom Y/N Comments  Symptom Y/N Comments   Fever/Chills n   Chest Pain n    Poor Appetite    Edema     Cough n   Abdominal Pain n    Sputum    Joint Pain y Knee and back pain   SOB/DOE    Pruritis/Rash     Nausea/vomit    Tolerating PT/OT     Diarrhea  Tolerating Diet     Constipation    Other       Could NOT obtain due to:      Objective:     VITALS:   Last 24hrs VS reviewed since prior progress note. Most recent are:  Patient Vitals for the past 24 hrs:   Temp Pulse Resp BP SpO2   03/30/17 1036 ??? 80 16 125/55 90 %   03/30/17 0705 97.9 ??F (36.6 ??C) 87 18 122/65 92 %   03/30/17 0330 97.8 ??F (36.6 ??C) 87 18 137/71 96 %   03/30/17 0000 97.9 ??F (36.6 ??C) 89 18 128/69 99 %   03/29/17 1930 98.5 ??F (36.9 ??C) 98 18 129/58 92 %   03/29/17 1612 97.8 ??F (36.6 ??C) (!) 105 18 121/63 94 %   03/29/17 1300 97.9 ??F (36.6 ??C) 91 18 122/78 96 %   03/29/17 1148 97.9 ??F (36.6 ??C) 89 18 121/77 96 %       Intake/Output Summary (Last 24 hours) at 03/30/2017 1048  Last data filed at 03/30/2017 0830  Gross per 24 hour   Intake 240 ml   Output 2325 ml   Net -2085 ml        PHYSICAL EXAM:  General: WD, WN. Alert, cooperative, no acute distress.    EENT:  EOMI. Anicteric sclerae. MMM  Resp:  Decreased breath sounds, no wheezing or rales.  No accessory muscle use  CV:  Regular  rhythm,?? 2+ leg edema unchanged.   GI:  Soft, Non distended, Non tender. ??+Bowel sounds  Neurologic:?? Alert and oriented X 3, normal speech, at baseline  Psych:???? Good insight.??Not anxious nor agitated  Skin:  No rashes.  No jaundice    Reviewed most current lab test results and cultures  YES  Reviewed most current radiology test results   YES  Review and summation of old records today    NO  Reviewed patient's current orders and MAR    YES  PMH/SH reviewed - no change compared to H&P  ________________________________________________________________________  Care Plan discussed with:    Comments   Patient x    Family      RN x    Care Manager     Consultant                        Multidiciplinary team rounds were held today with case manager, nursing, pharmacist and Higher education careers adviser.  Patient's plan of care was discussed; medications were reviewed and discharge planning was addressed.     ________________________________________________________________________  Total NON critical care TIME: 25 Minutes    Total CRITICAL CARE TIME Spent:   Minutes non procedure based      Comments   >50% of visit spent in counseling and coordination of care     ________________________________________________________________________  Evalina Field, MD     Procedures: see electronic medical records for all procedures/Xrays and details which were not copied into this note but were reviewed prior to creation of Plan.      LABS:  I reviewed today's most current labs and imaging studies.  Pertinent labs include:  No results for input(s): WBC, HGB, HCT, PLT, HGBEXT, HCTEXT, PLTEXT, HGBEXT, HCTEXT, PLTEXT in the last 72 hours.  Recent Labs     03/29/17  0522   NA 138   K 4.0   CL 99   CO2 32   GLU 104*   BUN 25*   CREA 0.70  CA 8.2*       Signed: Evalina Field, MD

## 2017-03-30 NOTE — Progress Notes (Signed)
Problem: Mobility Impaired (Adult and Pediatric)  Goal: *Acute Goals and Plan of Care (Insert Text)  Physical Therapy Goals    Reviewed and updated 03/27/17  1.  Patient will move from supine to sit and sit to supine  in bed with modified independence within 7 day(s).    2.  Patient will transfer from bed to chair and chair to bed with modified independence using the least restrictive device within 7 day(s).  3.  Patient will perform sit to stand with modified independence within 7 day(s).  4.  Patient will ambulate with supervision/set-up for 30 feet with the least restrictive device within 7 day(s).     Updated 03/19/2017  1.  Patient will move from supine to sit and sit to supine  in bed with modified independence within 7 day(s).    2.  Patient will transfer from bed to chair and chair to bed with modified independence using the least restrictive device within 7 day(s).  3.  Patient will perform sit to stand with modified independence within 7 day(s).  4.  Patient will ambulate with supervision/set-up for 100 feet with the least restrictive device within 7 day(s).   5.  Patient will ascend/descend 3 stairs with 1 handrail(s) with minimal assistance within 7 day(s)    Physical Therapy Goals  Initiated 03/12/2017  1.  Patient will move from supine to sit and sit to supine  in bed with modified independence within 7 day(s).    2.  Patient will transfer from bed to chair and chair to bed with modified independence using the least restrictive device within 7 day(s).  3.  Patient will perform sit to stand with modified independence within 7 day(s).  4.  Patient will ambulate with supervision/set-up for 50 feet with the least restrictive device within 7 day(s).   5.  Patient will ascend/descend 3 stairs with 1 handrail(s) with minimal assistance/contact guard assist within 7 day(s).      physical Therapy TREATMENT  Patient: Brittney Shelton [phone] y.o. female)  Date: 03/30/2017   Diagnosis: Acute respiratory failure with hypoxia (HCC) <principal problem not specified>      Precautions: Fall, DNR  Chart, physical therapy assessment, plan of care and goals were reviewed.    ASSESSMENT:  Pt cleared by nurse to mobilize. Pt received up in chair. Pt agreeable to therapy. Pt on 6LPM per nasal cannula. Pts o2 stats at rest at 91%. Pt performed sit to stand transfer at min A/CGA with additional time. Pt performed standing marching x10. Pts o2 stats decreasing to 82% after exercises. Pt need to sit. Pt requiring x4 mins to recover to 90%. Pt fatigued after. Pt performed seated exercises once recovered. Pt left up in chair with all needs met. Pt will benefit from rehab to improve strength and endurance for safe mobility.    Progression toward goals:  '[]'     Improving appropriately and progressing toward goals  '[x]'     Improving slowly and progressing toward goals  '[]'     Not making progress toward goals and plan of care will be adjusted     PLAN:  Patient continues to benefit from skilled intervention to address the above impairments.  Continue treatment per established plan of care.  Discharge Recommendations:  Rehab  Further Equipment Recommendations for Discharge:  TBD by rehab      SUBJECTIVE:   Patient stated ???I've been working on my standing.???    OBJECTIVE DATA SUMMARY:   Critical Behavior:  Neurologic State:  Alert  Orientation Level: Oriented X4  Cognition: Appropriate decision making, Appropriate for age attention/concentration, Appropriate safety awareness  Safety/Judgement: Awareness of environment, Fall prevention, Home safety  Functional Mobility Training:     Transfers:  Sit to Stand: Minimum assistance;Contact guard assistance;Additional time  Stand to Sit: Contact guard assistance;Additional time                             Balance:  Sitting: Intact  Standing: Impaired  Standing - Static: Fair;Constant support  Standing - Dynamic : Fair   Therapeutic Exercises:   Standing   Marching x10   Seated  LAQ x8  Marching x8  Hip abduction x10    Pain:  Pain Scale 1: Numeric (0 - 10)  Pain Intensity 1: 3  Pain Location 1: Back  Pain Orientation 1: Left  Pain Description 1: Aching  Pain Intervention(s) 1: Relaxation technique;Repositioned;Rest  Activity Tolerance:   Pt with improved tranfers with requiring x1 for assistance for safety.     After treatment:   '[x]'     Patient left in no apparent distress sitting up in chair  '[]'     Patient left in no apparent distress in bed  '[x]'     Call bell left within reach  '[x]'     Nursing notified  '[]'     Caregiver present  '[]'     Bed alarm activated    COMMUNICATION/COLLABORATION:   The patient???s plan of care was discussed with: Registered Nurse    Brittney Shelton   Time Calculation: 12 mins

## 2017-03-30 NOTE — Progress Notes (Signed)
Music Therapy Assessment    Brittney Shelton [id number]  [id number]    August 07, 1950  67 y.o.  female    Patient Telephone Number: [phone] (home)   Religious Affiliation: Darrick Meigs   Language: English   Extended Emergency Contact Information  Primary Emergency Contact: [place] Phone: [phone]  Relation: Brother  Secondary Emergency Contact: [place] Phone: [phone]  Relation: Other Relative   Patient Active Problem List    Diagnosis Date Noted   ??? Acute respiratory failure with hypoxia ([place]) 03/12/2017   ??? Angioedema 01/06/2017   ??? 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 Ambulatory dysfunction 12/18/2016   ??? Intractable back pain 12/17/2016   ??? Type 2 diabetes with nephropathy ([place]) 12/10/2016   ??? Obesity, morbid ([place]) 05/21/2016   ??? Acute on chronic diastolic (congestive) heart failure ([place]) 01/23/2016   ??? Obesity 07/27/2015   ??? Requires continuous at home supplemental oxygen 07/27/2015   ??? Dependence on supplemental oxygen 07/27/2015   ??? ACP (advance care planning) 07/27/2015   ??? Counseling regarding advanced care planning and goals of care 07/27/2015   ??? Dyspnea 07/27/2015   ??? COPD (chronic obstructive pulmonary disease) ([place]) 07/26/2015   ??? Well controlled type 2 diabetes mellitus ([place]) 09/29/2014   ??? Hypercholesterolemia 09/29/2014   ??? Mixed simple and mucopurulent chronic bronchitis ([place]) 09/29/2014   ??? Chronic acquired lymphedema 09/29/2014        Date: 03/30/2017       Mental Status:   _0  Alert [  ] Forgetful [  ]  Confused  [  ] Minimally responsive    Communication Status: [  ] Impaired Speech [  ] Nonverbal -N/A    Physical Status:   [  ] Oxygen in use  [  ] Hard of Hearing [  ] Vision Impaired  [  ] Ambulatory  [  ] Ambulatory with assistance [  ] Non-ambulatory -N/A    Music Preferences, Background: Pt said she likes most every genre of music. She requested today to hear [facility] or Soul.     Clinical Problem addressed: Positive social interaction, improve mood.    Goal(s) met in session:  Physical/Pain management (Scale of 1-10):    Pre-session rating: Pt reported regularly having pain in her back and knees, and that this was part of what brought her to the hospital. She declined rating her pain when asked, saying her doctors are managing it.  Post-session rating: No change reported.  [  ] Increased relaxation   [  ] Regulated breathing patterns  [  ] Decreased muscle tension   [  ] Minimized physical distress     Emotional/Psychological:  [  ] Increased self-expression   [  ] Decreased aggressive behavior   [  ] Decreased sadness   [  ] Discussed healthy coping skills     _1  Improved mood    [  ] Decreased withdrawn behavior     Social:  [  ] Decreased feelings of isolation/loneliness _2  Positive social interaction   [  ] Provided support and/or comfort for family/friends    Spiritual:  _3  Spiritual support    [  ] Expressed peace  [  ] Expressed faith    [  ] Discussed beliefs    Techniques Utilized (Check all that apply):   [  ] Procedural support MT [  ] Music for relaxation _4  Patient preferred music  [  ]  Lyric analysis  _0  Jerl Santos choice  [  ] Music for validation  [  ] Entrainment  [  ] Progressive muscle relax. [  ] Guided visualization  [  ] ISO Principle  [  ] Patient instrument playing [  ] Song writing  [  ] Sing along   [  ] Improvisation  _1  Sensory stimulation  [  ] Active Listening  _2  Music for spiritual support [  ] Making of CDs as gifts    Session Observations:  Referral from Jacelyn Grip, Palliative Social Worker. Patient (pt) was alert sitting up in a chair. She shared about a pattern of being in the hospital, going to rehab, going home, and then her pain flaring up and the pt returning to the hospital. This music therapist (MT) provided active listening and words of validation. MT asked pt about her music preferences and pt shared these. MT sat across from pt and sang  and played the [place], Take My Hand with guitar. Pt's facial expression brightened and became pensive. MT asked pt about this and pt said this song brought back memories of being in church as a younger person. Pt chose to hear a Noel Journey song next and MT sang and played No More Drama. Pt increased self-expression in response to the music, as evidenced by (AEB) sharing about her interests of doing cross word puzzles, watching television, and talking on the phone. Then, pt chose to hear the [place], which MT sang and played. Pt's affect brightened in response to the music and she expressed enjoyment in it and gratitude for the session. Will follow as able.    Mindi Slicker, MT-BC (Music Therapist-Board Certified)  Spiritual Care Department  Referral-based service

## 2017-03-30 NOTE — Progress Notes (Signed)
[facility]  Name: Brittney LimesMary S Shelton MRN: [id number]   DOB: March 22, 1951 [facility]   Date: 03/30/2017        IMPRESSION:   ?? Acute/chronic hypoxic hypercapnic respiratory failure  ?? Volume overload/pulmonary edema  ?? Diastolic heart failure  ?? Likely OSA/OHS  ?? She does NOT have COPD - PFTs in 2016 without any airway obstruction, only smoked for 1-2 years total - follows with Dr. Jayme CloudGonzalez  ?? Pulmonary HTN secondary to diastolic dysfunction and likely untreated OSA/OHS  ?? Pulmonary nodules - stable from 2017  ?? HTN  ?? DM  ?? Obesity  ?? Minimal tobacco history (<1 p-y)      PLAN:   ?? O2 - baseline is 4 LPM  ?? Diurese  ?? Discussed that she would benefit from a sleep study, but she does not think she would be compliant with CPAP  ?? Recommend removing COPD from her list of diagnoses  ?? Optimize her diastolic heart failure  ?? Needs a repeat CT in 1 year  ?? Insulin  ?? DVT prophylaxis     Subjective/Interval History:   I have reviewed the flowsheet and previous day???s notes.  Feeling better overall.  No acute complaints  Review of Systems   Constitutional: Negative.    HENT: Negative.    Eyes: Negative.    Respiratory: Negative.    Cardiovascular: Positive for leg swelling.   Gastrointestinal: Negative.      Objective:  Vital Signs:    Visit Vitals  BP 122/65   Pulse 87   Temp 97.8 ??F (36.6 ??C)   Resp 18   Ht 5\' 6"  (1.676 m)   Wt 127.8 kg (281 lb 12 oz)   SpO2 92%   BMI 45.48 kg/m??       O2 Device: Nasal cannula   O2 Flow Rate (L/min): 6 l/min   Temp (24hrs), Avg:98 ??F (36.7 ??C), Min:97.8 ??F (36.6 ??C), Max:98.5 ??F (36.9 ??C)     Intake/Output:   Last shift:      No intake/output data recorded.  Last 3 shifts: 10/27 1901 - 10/29 0700  In: -   Out: 2325 [Urine:2325]    Intake/Output Summary (Last 24 hours) at 03/30/2017 0825  Last data filed at 03/30/2017 0651  Gross per 24 hour   Intake ???   Output 1825 ml   Net -1825 ml      Physical Exam    Constitutional: She is oriented to person, place, and time. She is cooperative. No distress.   HENT:   Mouth/Throat: No oropharyngeal exudate.   Eyes: No scleral icterus.   Cardiovascular: Normal rate and regular rhythm.   No murmur heard.  Pulmonary/Chest: No respiratory distress. She has no wheezes. She has no rales.   Abdominal: Soft. Bowel sounds are normal. She exhibits no distension. There is no tenderness.   Musculoskeletal: She exhibits edema.   Neurological: She is alert and oriented to person, place, and time.   Skin: Skin is warm and dry.     Data:   Labs:  No results for input(s): WBC, HGB, HCT, PLT, HGBEXT, HCTEXT, PLTEXT in the last 72 hours.  Recent Labs     03/29/17  0522   NA 138   K 4.0   CL 99   CO2 32   GLU 104*   BUN 25*   CREA 0.70   CA 8.2*     No results for input(s):  PH, PCO2, PO2, HCO3, FIO2 in the last 72 hours.    Imaging:  I have personally reviewed the patient???s radiographs:  None today        Lendon Ka, MD

## 2017-03-31 ENCOUNTER — Inpatient Hospital Stay: Admit: 2017-03-31 | Payer: MEDICARE | Primary: Internal Medicine

## 2017-03-31 LAB — METABOLIC PANEL, BASIC
Anion gap: 7 mmol/L (ref 5–15)
BUN/Creatinine ratio: 36 — ABNORMAL HIGH (ref 12–20)
BUN: 26 MG/DL — ABNORMAL HIGH (ref 6–20)
CO2: 33 mmol/L — ABNORMAL HIGH (ref 21–32)
Calcium: 8.3 MG/DL — ABNORMAL LOW (ref 8.5–10.1)
Chloride: 98 mmol/L (ref 97–108)
Creatinine: 0.73 MG/DL (ref 0.55–1.02)
GFR est AA: >60 mL/min/{1.73_m2} (ref >60)
GFR est non-AA: >60 mL/min/{1.73_m2} (ref >60)
Glucose: 113 mg/dL — ABNORMAL HIGH (ref 65–100)
Potassium: 3.8 mmol/L (ref 3.5–5.1)
Sodium: 138 mmol/L (ref 136–145)

## 2017-03-31 LAB — CBC W/O DIFF
ABSOLUTE NRBC: 0 10*3/uL (ref 0.00–0.01)
HCT: 35.1 % (ref 35.0–47.0)
HGB: 11.2 g/dL — ABNORMAL LOW (ref 11.5–16.0)
MCH: 28.4 PG (ref 26.0–34.0)
MCHC: 31.9 g/dL (ref 30.0–36.5)
MCV: 89.1 FL (ref 80.0–99.0)
MPV: 11.2 FL (ref 8.9–12.9)
NRBC: 0 PER 100 WBC
PLATELET: 262 10*3/uL (ref 150–400)
RBC: 3.94 M/uL (ref 3.80–5.20)
RDW: 18.5 % — ABNORMAL HIGH (ref 11.5–14.5)
WBC: 6.2 10*3/uL (ref 3.6–11.0)

## 2017-03-31 LAB — MAGNESIUM: Magnesium: 2 mg/dL (ref 1.6–2.4)

## 2017-03-31 LAB — GLUCOSE, POC
Glucose (POC): 125 mg/dL — ABNORMAL HIGH (ref 65–100)
Glucose (POC): 181 mg/dL — ABNORMAL HIGH (ref 65–100)
Glucose (POC): 164 mg/dL — ABNORMAL HIGH (ref 65–100)
Glucose (POC): 142 mg/dL — ABNORMAL HIGH (ref 65–100)

## 2017-03-31 LAB — PHOSPHORUS: Phosphorus: 4.8 MG/DL — ABNORMAL HIGH (ref 2.6–4.7)

## 2017-03-31 MED ORDER — SPIRONOLACTONE 25 MG TAB
25 mg | Freq: Every day | ORAL | Status: DC
Start: 2017-03-31 — End: 2017-04-04
  Administered 2017-03-31 – 2017-04-04 (×5): via ORAL

## 2017-03-31 MED ORDER — FUROSEMIDE 10 MG/ML IJ SOLN
10 mg/mL | Freq: Two times a day (BID) | INTRAMUSCULAR | Status: DC
Start: 2017-03-31 — End: 2017-04-04
  Administered 2017-03-31 – 2017-04-04 (×8): via INTRAVENOUS

## 2017-03-31 MED ORDER — FUROSEMIDE 10 MG/ML IJ SOLN
10 mg/mL | Freq: Once | INTRAMUSCULAR | Status: AC
Start: 2017-03-31 — End: 2017-03-31
  Administered 2017-03-31: 14:00:00 via INTRAVENOUS

## 2017-03-31 MED FILL — LOVENOX 40 MG/0.4 ML SUBCUTANEOUS SYRINGE: 40 mg/0.4 mL | SUBCUTANEOUS | Qty: 0.4

## 2017-03-31 MED FILL — POTASSIUM CHLORIDE SR 10 MEQ TAB: 10 mEq | ORAL | Qty: 1

## 2017-03-31 MED FILL — METHOCARBAMOL 500 MG TAB: 500 mg | ORAL | Qty: 1

## 2017-03-31 MED FILL — AMLODIPINE 5 MG TAB: 5 mg | ORAL | Qty: 1

## 2017-03-31 MED FILL — HYDROCODONE-ACETAMINOPHEN 5 MG-325 MG TAB: 5-325 mg | ORAL | Qty: 1

## 2017-03-31 MED FILL — FUROSEMIDE 10 MG/ML IJ SOLN: 10 mg/mL | INTRAMUSCULAR | Qty: 8

## 2017-03-31 MED FILL — TOPROL XL 25 MG TABLET,EXTENDED RELEASE: 25 mg | ORAL | Qty: 1

## 2017-03-31 MED FILL — SPIRONOLACTONE 25 MG TAB: 25 mg | ORAL | Qty: 2

## 2017-03-31 MED FILL — PRAVASTATIN 10 MG TAB: 10 mg | ORAL | Qty: 2

## 2017-03-31 MED FILL — FUROSEMIDE 10 MG/ML IJ SOLN: 10 mg/mL | INTRAMUSCULAR | Qty: 4

## 2017-03-31 MED FILL — ASPIRIN 81 MG TAB, DELAYED RELEASE: 81 mg | ORAL | Qty: 1

## 2017-03-31 MED FILL — HEALTHYLAX 17 GRAM ORAL POWDER PACKET: 17 gram | ORAL | Qty: 1

## 2017-03-31 NOTE — Progress Notes (Signed)
Hospitalist Progress Note  NAME: Brittney Shelton   DOB:  April 19, 1951   MRN:  [id number]       Assessment / Plan:  Lumbar degenerative disc disese/back pain and right knee pain  XR spine, degenerative changes L3-S1, marked disc space narrowing at L4-L5, facet arthropathy L3-S1  PT/OT  S/p R knee arthrocentesis 10/21.  Appreciate ortho help with knee injections   PRN norco and tylenol for pain  Difficult to excalate pain meds due to severe COPD, but no c/o pain this AM  Acute on chronic hypoxic respiratory failure POA with baseline 4-5L of O2 on NC and Venti mask this AM, c/w supplemental O2. Wean down as tolerated. EF 65% with diastolic dysfunction  Acute on chronic diastolic congestive heart failure in settings of moderate Pulmonary HTN (ProBNP with [id number] which is significantly elevated in morbidly obese pt) c/w Lasix IV, add Spironolactone, Weight is down, monitor.  Obesity hypoventilation suspected.  No copd per pulmonary.  PFT's done 2016 w/o airway obstruction and only smoked 1-2 years total.  Repeat Echo 10/11, EF 60 % to 65 %. There were no regional wall motion abnormalities, was moderate pulmonary hypertension.  Required high flow O2. Now on 6L O2.  Difficulty weaning O2 to baseline of 4L due to hypoxia.  Patient does not tolerate BIPAP/CPAP due to tight fitting of mask. She has been requiring venimask at night.   Palliative care consulted. Patient decided on DNR.  Pulmonary and cardiology consulted. Consults appreciated.  Pulmonary recommends outpatient PFT's and repeat CT chest.  Outpatient PFTs  Pulmonary nodules  -repeat CT chest in 1 year  DM2 - Diet Controlled  Last A1C on record 11/2016 is 5.8. No meds listed PTA  hyperglycemia likely related to Steroids  Continue SSI for now   HTN  Cont toprol and amlodipine   Hypokalemia  -supplement  HLD  Cont home pravastatin  Morbid obesity - due to excess weight.  Constipation   Cont laxative  Generalized weakness  -cont PT/OT  Code Status:  DNR   Surrogate Decision Maker: Justine NullShannon Boomer [phone]  DVT Prophylaxis: sq heparin  GI Prophylaxis: not indicated  Baseline: lives at home with family    D/C plan in 1-2 days to SNF if stable     Subjective: Pt seen and examined at bedside. Knee pain improving, breathing better. Overnight events d/w RN     Chief Complaint / Reason for Physician Visit: f/u "Respiratory Failure"  Breathing better. Slept well last night. No cp. Breathing okay at rest. No n/v/abd pain.    Review of Systems:  Symptom Y/N Comments  Symptom Y/N Comments   Fever/Chills n   Chest Pain n    Poor Appetite    Edema y    Cough n   Abdominal Pain n    Sputum    Joint Pain y Knee and back pain   SOB/DOE    Pruritis/Rash     Nausea/vomit    Tolerating PT/OT     Diarrhea    Tolerating Diet y    Constipation    Other       Could NOT obtain due to:      Objective:     VITALS:   Last 24hrs VS reviewed since prior progress note. Most recent are:  Patient Vitals for the past 24 hrs:   Temp Pulse Resp BP SpO2   03/31/17 0712 97.7 ??F (36.5 ??C) 87 18 140/70 99 %   03/31/17 0258 97.4 ??F (36.3 ??C)  88 16 127/63 90 %   03/30/17 2330 98.2 ??F (36.8 ??C) 91 16 116/60 96 %   03/30/17 1934 97.7 ??F (36.5 ??C) 91 18 130/68 91 %   03/30/17 1645 97.9 ??F (36.6 ??C) 87 16 118/65 93 %   03/30/17 1200 97.7 ??F (36.5 ??C) 85 16 106/63 92 %   03/30/17 1036 97.8 ??F (36.6 ??C) 80 16 125/55 90 %       Intake/Output Summary (Last 24 hours) at 03/31/2017 0920  Last data filed at 03/31/2017 0903  Gross per 24 hour   Intake 1080 ml   Output 1150 ml   Net -70 ml        PHYSICAL EXAM:  General: WD, WN. Alert, cooperative, no acute distress.   EENT:  EOMI. Anicteric sclerae. MMM  Resp:  Decreased breath sounds, no wheezing or rales.  No accessory muscle use  CV:  Regular  rhythm,?? 2+ leg edema unchanged.   GI:  Soft, Non distended, Non tender. ??+Bowel sounds  Neurologic:?? Alert and oriented X 3, normal speech, at baseline  Psych:???? Good insight.??Not anxious nor agitated   Skin:  No rashes.  No jaundice    Reviewed most current lab test results and cultures  YES  Reviewed most current radiology test results   YES  Review and summation of old records today    NO  Reviewed patient's current orders and MAR    YES  PMH/SH reviewed - no change compared to H&P  ________________________________________________________________________  Care Plan discussed with:    Comments   Patient x    Family      RN x    Care Manager     Consultant                        Multidiciplinary team rounds were held today with case manager, nursing, pharmacist and Higher education careers adviser.  Patient's plan of care was discussed; medications were reviewed and discharge planning was addressed.     ________________________________________________________________________  Total NON critical care TIME: 25 Minutes    Total CRITICAL CARE TIME Spent:   Minutes non procedure based      Comments   >50% of visit spent in counseling and coordination of care y    ________________________________________________________________________  Hazeline Junker, MD     Procedures: see electronic medical records for all procedures/Xrays and details which were not copied into this note but were reviewed prior to creation of Plan.      LABS:  I reviewed today's most current labs and imaging studies.  Pertinent labs include:  Recent Labs     03/31/17  0548   WBC 6.2   HGB 11.2*   HCT 35.1   PLT 262     Recent Labs     03/31/17  0548 03/29/17  0522   NA 138 138   K 3.8 4.0   CL 98 99   CO2 33* 32   GLU 113* 104*   BUN 26* 25*   CREA 0.73 0.70   CA 8.3* 8.2*   MG 2.0  --    PHOS 4.8*  --        Signed: Hazeline Junker, MD

## 2017-03-31 NOTE — Progress Notes (Signed)
Occupational Therapy Goals  Routine 7 day Re-eval 03/23/2017, Goals 1-3 and goals 5 and 6 remain appropriate, goal 4 modified and goal 7 newly established as per 10/30 weekly Reevaluation.     1.  Patient will perform grooming standing with supervision/set-up 7 days.   2.  Patient will participate in anterior bathing with Min A from seated position within 7 days.  3.  Patient will perform rolling in bed with Mod A x1 in preparation for toileting and hygiene within 7 days.  4.  Patient will perform toilet transfer to bedside commode with supervision within 7 days. Change to toilet or BSC with supervision as of 10/30.  5.  Patient will participate in all aspects of toileting with overall Mod A x1 within 7 days.  6.  Patient will complete upper body therapeutic exercises/activities to maximize independence and safety with ADLs for at least 5 minutes within 7 days.   7.  Patient will perform upper body dressing with supervision/setup, within 7 days. Established 10/30.    Occupational Therapy TREATMENT: WEEKLY REASSESSMENT  Patient: Brittney Shelton (66 y.o. female)  Date: 03/31/2017  Diagnosis: Acute respiratory failure with hypoxia (HCC) <principal problem not specified>      Precautions: Fall, DNR  Chart, occupational therapy assessment, plan of care, and goals were reviewed.    ASSESSMENT:  Patient is presently limited by baseline obesity, mild GW, poor activity tolerance, decreased balance and decreased safety awareness which continues to greatly impair her functional independence. She is generally motivated and eager to maximize her functional independence, despite her limited progress. She was able to ambulate to and from the bathroom to work towards doing so for future toileting. Patient desaturating to as low as 79% on 6L NC during activity, requiring the 6L NC and 50% venti-mask to maintain an oxygen saturation in the upper 90s. Overall she was min A for  UB dressing, total A for clothing management in preparation for toileting, and was CGA for transfers and ambulation with a RW. Minimal cueing needed for safety during mobility. At this time the patient is benefiting from OT services. Her frequency will be reduced to 3 times per week 2/2 her slow progress and poor activity tolerance.     Progression toward goals:  []             Improving appropriately and progressing toward goals  [x]             Improving slowly and progressing toward goals  []             Not making progress toward goals and plan of care will be adjusted     PLAN:  Goals have been updated based on progression since last assessment.  Patient continues to benefit from skilled intervention to address the above impairments.  Continue to follow patient 3 times a week to address goals.  Planned Interventions:  [x]                     Self Care Training                  [x]              Therapeutic Activities  [x]                     Functional Mobility Training    []              Cognitive Retraining  [x]   Therapeutic Exercises           [x]              Endurance Activities  [x]                     Balance Training                   []              Neuromuscular Re-Education  []                     Visual/Perceptual Training     [x]         Home Safety Training  [x]                     Patient Education                 [x]              Family Training/Education  [x]                     Other (comment): energy conservation training  Discharge Recommendations: Skilled Nursing Facility with pulmonary focus [facility] program)  Further Equipment Recommendations for Discharge: TBD         OBJECTIVE DATA SUMMARY:   Cognitive/Behavioral Status:  Neurologic State: Alert  Orientation Level: Oriented X4  Cognition: Follows commands;Appropriate for age attention/concentration        Safety/Judgement: Decreased awareness of need for safety  Functional Mobility and Transfers for ADLs:Bed Mobility:   Scooting: Supervision    Transfers:  Sit to Stand: Contact guard assistance  Functional Transfers  Bathroom Mobility: Contact guard assistance(ambulating with RW)  Toilet Transfer : Contact guard assistance(from BSC over toilet)  Cues: Verbal cues provided        Balance:  Sitting: Intact  Standing: Impaired(with support of RW)  Standing - Static: Good  Standing - Dynamic : Fair  ADL Intervention:  Grooming  Washing Hands: (deferred 2/2 poor activity tolerance)    Upper Body Dressing Assistance  Shirt simulation with hospital gown: Minimum  assistance;Compensatory technique training(performed in sitting)  Cues: Tactile cues provided;Verbal cues provided;Visual cues provided    Toileting  Toileting Assistance: Total assistance(dependent)  Clothing Management: Total assistance (dependent)    Cognitive Retraining  Safety/Judgement: Decreased awareness of need for safety  Pain:  Pain Scale 1: Numeric (0 - 10)  Pain Intensity 1: 6  Pain Location 1: Back  Pain Orientation 1: Left  Pain Description 1: Aching  Pain Intervention(s) 1: Repositioned;Rest  Activity Tolerance:   SpO2 98 to 100% on 6 L NC and 50% venti mask at rest. SpO2 remained 96% despite SOB ambulating back from bathroom on venti mask and NC.  Ambulation performed to bathroom on 6L NC only, with patient desaturating to an SpO2 of 79%. Patient recovered quickly to 98% once venti mask back on.   Please refer to the flowsheet for vital signs taken during this treatment.  After treatment:   [x]  Patient left in no apparent distress sitting up in chair  []  Patient left in no apparent distress in bed  [x]  Call bell left within reach  [x]  Nursing notified  []  Caregiver present  []  Bed alarm activated    COMMUNICATION/COLLABORATION:   The patient???s plan of care was discussed with: Registered Nurse    Michel HarrowShawn P Fovel, OTR/L  Time Calculation: 31 mins

## 2017-03-31 NOTE — Progress Notes (Signed)
Orders received and chart reviewed.  Pt currently off of the floor for procedure.  Will f/u as pt available for PT tx session.

## 2017-03-31 NOTE — Progress Notes (Signed)
[facility] OF [facility], Critical Care, and Sleep Medicine  Name: Brittney Shelton MRN: [id number]   DOB: April 28, 1951 Hospital: [facility]   Date: 03/31/2017        IMPRESSION:   ?? Acute/chronic hypoxic hypercapnic respiratory failure  ?? Volume overload/pulmonary edema  ?? Diastolic heart failure  ?? Likely OSA/OHS  ?? Pulmonary HTN secondary to diastolic dysfunction and likely untreated OSA/OHS  ?? Pulmonary nodules - stable from 2017  ?? HTN  ?? DM  ?? Obesity  ?? Minimal tobacco history (<1 p-y)      PLAN:   ?? O2 - baseline is 4 LPM  ?? Diurese more aggressively  ?? Discussed that she would benefit from a sleep study, but she does not think she would be compliant with CPAP  ?? Optimize her diastolic heart failure  ?? Needs a repeat CT in 1 year  ?? Insulin  ?? DVT prophylaxis     Subjective/Interval History:   I have reviewed the flowsheet and previous day???s notes.  Feeling better overall.  No acute complaints, but still with significant O2 desaturation.   Review of Systems   Constitutional: Negative.    HENT: Negative.    Eyes: Negative.    Respiratory: Negative.    Cardiovascular: Positive for leg swelling.   Gastrointestinal: Negative.      Objective:  Vital Signs:    Visit Vitals  BP 140/70 (BP 1 Location: Right arm, BP Patient Position: At rest)   Pulse 87   Temp 97.7 ??F (36.5 ??C)   Resp 18   Ht 5\' 6"  (1.676 m)   Wt 124.7 kg (275 lb 0.4 oz)   SpO2 99%   BMI 44.39 kg/m??       O2 Device: Nasal cannula   O2 Flow Rate (L/min): 6 l/min   Temp (24hrs), Avg:97.8 ??F (36.6 ??C), Min:97.4 ??F (36.3 ??C), Max:98.2 ??F (36.8 ??C)     Intake/Output:   Last shift:      10/30 0701 - 10/30 1900  In: 240 [P.O.:240]  Out: 400 [Urine:400]  Last 3 shifts: 10/28 1901 - 10/30 0700  In: 840 [P.O.:840]  Out: 2150 [Urine:2150]    Intake/Output Summary (Last 24 hours) at 03/31/2017 0902  Last data filed at 03/31/2017 0815  Gross per 24 hour   Intake 840 ml   Output 1150 ml   Net -310 ml      Physical Exam    Constitutional: She is oriented to person, place, and time. She is cooperative. No distress.   HENT:   Mouth/Throat: No oropharyngeal exudate.   Eyes: No scleral icterus.   Cardiovascular: Normal rate and regular rhythm.   No murmur heard.  Pulmonary/Chest: No respiratory distress. She has no wheezes. She has rales.   Abdominal: Soft. Bowel sounds are normal. She exhibits no distension. There is no tenderness.   Musculoskeletal: She exhibits edema.   Neurological: She is alert and oriented to person, place, and time.   Skin: Skin is warm and dry.     Data:   Labs:  Recent Labs     03/31/17  0548   WBC 6.2   HGB 11.2*   HCT 35.1   PLT 262     Recent Labs     03/31/17  0548 03/29/17  0522   NA 138 138   K 3.8 4.0   CL 98 99   CO2 33* 32   GLU 113* 104*   BUN 26* 25*   CREA  0.73 0.70   CA 8.3* 8.2*   MG 2.0  --    PHOS 4.8*  --      No results for input(s): PH, PCO2, PO2, HCO3, FIO2 in the last 72 hours.    Imaging:  I have personally reviewed the patient???s radiographs:  None today        Lendon KaJeremy G Avik Leoni, MD

## 2017-03-31 NOTE — Progress Notes (Signed)
PCU SHIFT NURSING NOTE    Bedside and Verbal shift change report given to LaNita,RN (oncoming nurse) by Assunta FoundBilly,RN (offgoing nurse). Report included the following information SBAR, Kardex, Procedure Summary, Intake/Output, MAR, Recent Results, Med Rec Status, Cardiac Rhythm NSR and Alarm Parameters .    Shift Summary:1934 Bedside report received. Patient voiced no c/o pain and/or discomfort. No s/sx of distress noted. Patient continues on 6lpm O2 via nasal cannula.    Patient tolerated meds without difficulty. Patient c/o bilateral knee pain, PRN medication given per request.    Patient observed resting throughout shift.      Admission Date 03/11/2017   Admission Diagnosis Acute respiratory failure with hypoxia (HCC)   Consults IP CONSULT TO CARDIOLOGY  IP CONSULT TO PALLIATIVE CARE - PROVIDER  IP CONSULT TO INTENSIVIST  IP CONSULT TO ORTHOPEDIC SURGERY  IP CONSULT TO ORTHOPEDIC SURGERY        Consults   [] PT   [] OT   [] Speech   [] Case Management      []  Palliative      Cardiac Monitoring Order   [] Yes   [] No     IV drips   [] Yes    Drip:                            Dose:  Drip:                            Dose:  Drip:                            Dose:   [] No     GI Prophylaxis   [] Yes   [] No         DVT Prophylaxis   SCDs:             Ted stockings:         []  Medication   [] Contraindicated   [] None      Activity Level Activity Level: Recliner, Up with Assistance     Activity Assistance: Partial (one person)   Purposeful Rounding every 1-2 hour?   [] Yes   Schmidt Score  Total Score: 3   Bed Alarm (If score 3 or >)   [] Yes   []  Refused (See signed refusal form in chart)   Braden Score  Braden Score: 15   Braden Score (if score 14 or less)   [] PMT consult   [] Wound Care consult      [] Specialty bed   []  Nutrition consult          Needs prior to discharge:   Home O2 required:    [] Yes   [] No    If yes, how much O2 required?    Other:    Last Bowel Movement: Last Bowel Movement Date: 03/30/17       Influenza Vaccine Received Flu Vaccine for Current Season (usually Sept-March): No    Patient/Guardian Refused (Notify MD): Yes   Pneumonia Vaccine           Diet Active Orders   Diet    DIET CARDIAC Regular      LDAs               Peripheral IV 03/12/17 Left Antecubital (Active)   Site Assessment Clean, dry, & intact 03/30/2017  7:34 PM   Phlebitis Assessment 0 03/30/2017  7:34 PM   Infiltration  Assessment 0 03/30/2017  7:34 PM   Dressing Status Clean, dry, & intact 03/30/2017  7:34 PM   Dressing Type Tape;Transparent 03/30/2017  7:34 PM   Hub Color/Line Status Pink;Capped;Flushed 03/30/2017  7:34 PM   Action Taken Open ports on tubing capped 03/30/2017  7:34 PM   Alcohol Cap Used Yes 03/30/2017  7:34 PM          External Female Catheter 03/12/17 (Active)   Site Assessment Clean, dry, & intact 03/30/2017  7:34 PM   Repositioned No 03/30/2017  7:34 PM   Perineal Care No 03/30/2017  7:34 PM   Wick Changed No 03/30/2017  7:34 PM   Suction Canister/Tubing Changed No 03/30/2017  7:34 PM   Urine Output (mL) 350 ml 03/30/2017  7:34 PM                Urinary Catheter      Intake & Output Date 03/30/17 0700 - 03/31/17 0659 03/31/17 0700 - 04/01/17 0659   Shift 0700-1859 1900-0659 24 Hour Total 0700-1859 1900-0659 24 Hour Total   INTAKE   P.O. 840  840        P.O. 840  840      Shift Total(mL/kg) 840(6.6)  840(6.6)      OUTPUT   Urine(mL/kg/hr) 900(0.6) 350 1250        Urine Output (mL) (External Female Catheter 03/12/17) [phone]      Stool           Stool Occurrence(s) 1 x  1 x      Shift Total(mL/kg) 900(7) 350(2.7) 1250(9.8)      NET -60 -350 -410      Weight (kg) 127.8 127.8 127.8 127.8 127.8 127.8         Readmission Risk Assessment Tool Score High Risk            36       Total Score        3 Has Seen PCP in Last 6 Months (Yes=3, No=0)    3 Patient Length of Stay (>5 days = 3)    4 IP Visits Last 12 Months (1-3=4, 4=9, >4=11)    9 Pt. Coverage (Medicare=5 , Medicaid, or Self-Pay=4)     17 Charlson Comorbidity Score (Age + Comorbid Conditions)        Criteria that do not apply:    Married. Living with Significant Other. Assisted Living. LTAC. SNF. or   Rehab       Expected Length of Stay 4d 2h   Actual Length of Stay 19

## 2017-03-31 NOTE — Progress Notes (Signed)
Bedside and Verbal shift change report given to Jodene NamPatricia R. Keneagy, student nurse (oncoming nurse) by Winona LegatoLaNita Dawson (offgoing nurse). Report included the following information SBAR, Kardex, ED Summary, Procedure Summary, Intake/Output, MAR, Recent Results, Med Rec Status, Cardiac Rhythm NSR, Alarm Parameters  and Quality Measures.

## 2017-03-31 NOTE — Progress Notes (Signed)
PCU SHIFT NURSING NOTE    Bedside and Verbal shift change report given to LaNita,RN (oncoming nurse) by Assunta FoundBilly,RN (offgoing nurse). Report included the following information SBAR, Kardex, Procedure Summary, Intake/Output, MAR, Recent Results, Med Rec Status, Cardiac Rhythm NSR and Alarm Parameters .    Shift Summary: 1910 Bedside report received at this time. Patient observed resting in bed. Patient continues on non-rebreather mask. No signs of distress noted at this time.    Patient tolerated meds without difficulty. Patient requested Tylenol for bilateral knee & back pain. Medication given per request.    Patient turned throughout shift & rested.         Admission Date 03/11/2017   Admission Diagnosis Acute respiratory failure with hypoxia (HCC)   Consults IP CONSULT TO CARDIOLOGY  IP CONSULT TO PALLIATIVE CARE - PROVIDER  IP CONSULT TO INTENSIVIST  IP CONSULT TO ORTHOPEDIC SURGERY  IP CONSULT TO ORTHOPEDIC SURGERY        Consults   [] PT   [] OT   [] Speech   [] Case Management      []  Palliative      Cardiac Monitoring Order   [] Yes   [] No     IV drips   [] Yes    Drip:                            Dose:  Drip:                            Dose:  Drip:                            Dose:   [] No     GI Prophylaxis   [] Yes   [] No         DVT Prophylaxis   SCDs:             Ted stockings:         []  Medication   [] Contraindicated   [] None      Activity Level Activity Level: Up with Assistance     Activity Assistance: Partial (two people)   Purposeful Rounding every 1-2 hour?   [] Yes   Schmidt Score  Total Score: 3   Bed Alarm (If score 3 or >)   [] Yes   []  Refused (See signed refusal form in chart)   Braden Score  Braden Score: 16   Braden Score (if score 14 or less)   [] PMT consult   [] Wound Care consult      [] Specialty bed   []  Nutrition consult          Needs prior to discharge:   Home O2 required:    [] Yes   [] No    If yes, how much O2 required?    Other:    Last Bowel Movement: Last Bowel Movement Date: 03/31/17       Influenza Vaccine Received Flu Vaccine for Current Season (usually Sept-March): No    Patient/Guardian Refused (Notify MD): Yes   Pneumonia Vaccine           Diet Active Orders   Diet    DIET CARDIAC Regular      LDAs               Peripheral IV 03/31/17 Right Hand (Active)   Site Assessment Clean, dry, & intact 03/31/2017  5:04 PM   Phlebitis Assessment  0 03/31/2017  5:04 PM   Infiltration Assessment 0 03/31/2017  5:04 PM   Dressing Status Clean, dry, & intact 03/31/2017  5:04 PM   Dressing Type Tape;Transparent 03/31/2017  5:04 PM   Hub Color/Line Status Blue;Capped;Flushed 03/31/2017  5:04 PM   Action Taken Open ports on tubing capped 03/31/2017  5:04 PM   Alcohol Cap Used Yes 03/31/2017  5:04 PM          External Female Catheter 03/12/17 (Active)   Site Assessment Clean, dry, & intact 03/31/2017  5:04 PM   Repositioned Yes 03/31/2017  5:04 PM   Perineal Care Yes 03/31/2017  5:04 PM   Wick Changed Yes 03/31/2017  5:04 PM   Suction Canister/Tubing Changed No 03/31/2017  5:04 PM   Urine Output (mL) 500 ml 03/31/2017  5:04 PM                Urinary Catheter      Intake & Output Date 03/31/17 0700 - 04/01/17 0659 04/01/17 0700 - 04/02/17 0659   Shift 0700-1859 1900-0659 24 Hour Total 0700-1859 1900-0659 24 Hour Total   INTAKE   P.O. 1080  1080        P.O. 1080  1080      Shift Total(mL/kg) 1080(8.7)  1080(8.7)      OUTPUT   Urine(mL/kg/hr) 1200(0.8)  1200        Urine Output (mL) (External Female Catheter 03/12/17) 1200  1200      Shift Total(mL/kg) 1200(9.6)  1200(9.6)      NET -120  -120      Weight (kg) 124.7 124.7 124.7 124.7 124.7 124.7         Readmission Risk Assessment Tool Score High Risk            36       Total Score        3 Has Seen PCP in Last 6 Months (Yes=3, No=0)    3 Patient Length of Stay (>5 days = 3)    4 IP Visits Last 12 Months (1-3=4, 4=9, >4=11)    9 Pt. Coverage (Medicare=5 , Medicaid, or Self-Pay=4)    17 Charlson Comorbidity Score (Age + Comorbid Conditions)         Criteria that do not apply:    Married. Living with Significant Other. Assisted Living. LTAC. SNF. or   Rehab       Expected Length of Stay 4d 2h   Actual Length of Stay 20

## 2017-03-31 NOTE — Progress Notes (Signed)
PCU SHIFT NURSING NOTE    Bedside and Verbal shift change report given to Pecolia AdesWilliam Brooks, RN (oncoming nurse) by Winona LegatoLaNita Dawson, RN (offgoing nurse). Report included the following information SBAR, Kardex, ED Summary, Procedure Summary, Intake/Output, MAR, Recent Results, Med Rec Status, Cardiac Rhythm NSR, Alarm Parameters  and Quality Measures.    Shift Summary:         Admission Date 03/11/2017   Admission Diagnosis Acute respiratory failure with hypoxia (HCC)   Consults IP CONSULT TO CARDIOLOGY  IP CONSULT TO PALLIATIVE CARE - PROVIDER  IP CONSULT TO INTENSIVIST  IP CONSULT TO ORTHOPEDIC SURGERY  IP CONSULT TO ORTHOPEDIC SURGERY        Consults   [x] PT   [x] OT   [] Speech   [x] Case Management      [x]  Palliative      Cardiac Monitoring Order   [x] Yes   [] No     IV drips   [] Yes    Drip:                            Dose:  Drip:                            Dose:  Drip:                            Dose:   [x] No     GI Prophylaxis   [x] Yes   [] No         DVT Prophylaxis   SCDs:             Ted stockings:         [x]  Medication   [] Contraindicated   [] None      Activity Level Activity Level: Up with Assistance     Activity Assistance: Partial (two people)   Purposeful Rounding every 1-2 hour?   [x] Yes   Schmidt Score  Total Score: 3   Bed Alarm (If score 3 or >)   [x] Yes   []  Refused (See signed refusal form in chart)   Braden Score  Braden Score: 16   Braden Score (if score 14 or less)   [x] PMT consult   [] Wound Care consult      [x] Specialty bed   [x]  Nutrition consult          Needs prior to discharge:   Home O2 required:    [x] Yes   [] No    If yes, how much O2 required? 4 liters baseline at home    Other:    Last Bowel Movement: Last Bowel Movement Date: 03/30/17      Influenza Vaccine Received Flu Vaccine for Current Season (usually Sept-March): No    Patient/Guardian Refused (Notify MD): Yes   Pneumonia Vaccine           Diet Active Orders   Diet    DIET CARDIAC Regular      LDAs                Peripheral IV 03/12/17 Left Antecubital (Active)   Site Assessment Clean, dry, & intact 03/31/2017  7:12 AM   Phlebitis Assessment 0 03/31/2017  7:12 AM   Infiltration Assessment 0 03/31/2017  7:12 AM   Dressing Status Clean, dry, & intact 03/31/2017  7:12 AM   Dressing Type Tape;Transparent 03/31/2017  7:12 AM   Hub Color/Line Status Pink;Capped;Flushed 03/31/2017  7:12 AM   Action Taken Open ports on tubing capped 03/31/2017  7:12 AM   Alcohol Cap Used Yes 03/31/2017  7:12 AM          External Female Catheter 03/12/17 (Active)   Site Assessment Clean, dry, & intact 03/31/2017  7:12 AM   Repositioned No 03/31/2017  7:12 AM   Perineal Care No 03/31/2017  7:12 AM   Wick Changed No 03/31/2017  7:12 AM   Suction Canister/Tubing Changed Yes 03/31/2017  7:12 AM   Urine Output (mL) 400 ml 03/31/2017  7:12 AM                Urinary Catheter      Intake & Output Date 03/30/17 0700 - 03/31/17 0659 03/31/17 0700 - 04/01/17 0659   Shift 0700-1859 1900-0659 24 Hour Total 0700-1859 1900-0659 24 Hour Total   INTAKE   P.O. 840  840        P.O. 840  840      Shift Total(mL/kg) 840(6.6)  840(6.8)      OUTPUT   Urine(mL/kg/hr) 900(0.6) 350(0.2) 1250(0.4) 400  400     Urine Output (mL) (External Female Catheter 03/12/17) [phone] 400  400   Stool           Stool Occurrence(s) 1 x  1 x      Shift Total(mL/kg) 900(7) 350(2.8) 1250(10) 400(3.2)  400(3.2)   NET -60 -350 -410 -400  -400   Weight (kg) 127.8 124.4 124.4 124.7 124.7 124.7         Readmission Risk Assessment Tool Score High Risk            36       Total Score        3 Has Seen PCP in Last 6 Months (Yes=3, No=0)    3 Patient Length of Stay (>5 days = 3)    4 IP Visits Last 12 Months (1-3=4, 4=9, >4=11)    9 Pt. Coverage (Medicare=5 , Medicaid, or Self-Pay=4)    17 Charlson Comorbidity Score (Age + Comorbid Conditions)        Criteria that do not apply:    Married. Living with Significant Other. Assisted Living. LTAC. SNF. or   Rehab       Expected Length of Stay 4d 2h    Actual Length of Stay 19

## 2017-03-31 NOTE — Progress Notes (Signed)
Problem: Pressure Injury - Risk of  Goal: *Prevention of pressure injury  Document Braden Scale and appropriate interventions in the flowsheet.   Outcome: Progressing Towards Goal  Pressure Injury Interventions:  Sensory Interventions: Assess changes in LOC, Avoid rigorous massage over bony prominences, Check visual cues for pain, Keep linens dry and wrinkle-free, Maintain/enhance activity level, Minimize linen layers    Moisture Interventions: Absorbent underpads, Internal/External urinary devices, Minimize layers, Moisture barrier, Maintain skin hydration (lotion/cream)    Activity Interventions: Assess need for specialty bed, Increase time out of bed, Pressure redistribution bed/mattress(bed type), Chair cushion    Mobility Interventions: Assess need for specialty bed, Chair cushion, Float heels, Pressure redistribution bed/mattress (bed type)    Nutrition Interventions: Document food/fluid/supplement intake    Friction and Shear Interventions: Apply protective barrier, creams and emollients, Foam dressings/transparent film/skin sealants, Lift team/patient mobility team, Minimize layers               Problem: Falls - Risk of  Goal: *Absence of Falls  Document Schmid Fall Risk and appropriate interventions in the flowsheet.   Outcome: Progressing Towards Goal  Fall Risk Interventions:  Mobility Interventions: Bed/chair exit alarm, Patient to call before getting OOB, PT Consult for assist device competence, Strengthening exercises (ROM-active/passive)    Mentation Interventions: Adequate sleep, hydration, pain control, Door open when patient unattended, More frequent rounding, Reorient patient, Room close to nurse's station, Toileting rounds, Update white board    Medication Interventions: Evaluate medications/consider consulting pharmacy, Patient to call before getting OOB, Teach patient to arise slowly    Elimination Interventions: Call light in reach, Patient to call for help  with toileting needs, Toileting schedule/hourly rounds

## 2017-04-01 LAB — CBC WITH AUTOMATED DIFF
ABS. BASOPHILS: 0 10*3/uL (ref 0.0–0.1)
ABS. EOSINOPHILS: 0.4 10*3/uL (ref 0.0–0.4)
ABS. IMM. GRANS.: 0.1 10*3/uL — ABNORMAL HIGH (ref 0.00–0.04)
ABS. LYMPHOCYTES: 1.3 10*3/uL (ref 0.8–3.5)
ABS. MONOCYTES: 0.5 10*3/uL (ref 0.0–1.0)
ABS. NEUTROPHILS: 4.2 10*3/uL (ref 1.8–8.0)
ABSOLUTE NRBC: 0 10*3/uL (ref 0.00–0.01)
BASOPHILS: 1 % (ref 0–1)
EOSINOPHILS: 6 % (ref 0–7)
HCT: 35.1 % (ref 35.0–47.0)
HGB: 11.1 g/dL — ABNORMAL LOW (ref 11.5–16.0)
IMMATURE GRANULOCYTES: 1 % — ABNORMAL HIGH (ref 0.0–0.5)
LYMPHOCYTES: 20 % (ref 12–49)
MCH: 28.3 PG (ref 26.0–34.0)
MCHC: 31.6 g/dL (ref 30.0–36.5)
MCV: 89.5 FL (ref 80.0–99.0)
MONOCYTES: 8 % (ref 5–13)
MPV: 11.5 FL (ref 8.9–12.9)
NEUTROPHILS: 65 % (ref 32–75)
NRBC: 0 PER 100 WBC
PLATELET: 259 10*3/uL (ref 150–400)
RBC: 3.92 M/uL (ref 3.80–5.20)
RDW: 18.4 % — ABNORMAL HIGH (ref 11.5–14.5)
WBC: 6.4 10*3/uL (ref 3.6–11.0)

## 2017-04-01 LAB — METABOLIC PANEL, COMPREHENSIVE
A-G Ratio: 0.6 — ABNORMAL LOW (ref 1.1–2.2)
ALT (SGPT): 10 U/L — ABNORMAL LOW (ref 12–78)
AST (SGOT): 8 U/L — ABNORMAL LOW (ref 15–37)
Albumin: 2.6 g/dL — ABNORMAL LOW (ref 3.5–5.0)
Alk. phosphatase: 58 U/L (ref 45–117)
Anion gap: 4 mmol/L — ABNORMAL LOW (ref 5–15)
BUN/Creatinine ratio: 35 — ABNORMAL HIGH (ref 12–20)
BUN: 29 MG/DL — ABNORMAL HIGH (ref 6–20)
Bilirubin, total: 0.6 MG/DL (ref 0.2–1.0)
CO2: 34 mmol/L — ABNORMAL HIGH (ref 21–32)
Calcium: 8.6 MG/DL (ref 8.5–10.1)
Chloride: 98 mmol/L (ref 97–108)
Creatinine: 0.83 MG/DL (ref 0.55–1.02)
GFR est AA: >60 mL/min/{1.73_m2} (ref >60)
GFR est non-AA: >60 mL/min/{1.73_m2} (ref >60)
Globulin: 4.5 g/dL — ABNORMAL HIGH (ref 2.0–4.0)
Glucose: 105 mg/dL — ABNORMAL HIGH (ref 65–100)
Potassium: 4 mmol/L (ref 3.5–5.1)
Protein, total: 7.1 g/dL (ref 6.4–8.2)
Sodium: 136 mmol/L (ref 136–145)

## 2017-04-01 LAB — GLUCOSE, POC
Glucose (POC): 135 mg/dL — ABNORMAL HIGH (ref 65–100)
Glucose (POC): 108 mg/dL — ABNORMAL HIGH (ref 65–100)
Glucose (POC): 153 mg/dL — ABNORMAL HIGH (ref 65–100)
Glucose (POC): 121 mg/dL — ABNORMAL HIGH (ref 65–100)

## 2017-04-01 LAB — MAGNESIUM: Magnesium: 2.1 mg/dL (ref 1.6–2.4)

## 2017-04-01 MED FILL — MAPAP (ACETAMINOPHEN) 325 MG TABLET: 325 mg | ORAL | Qty: 2

## 2017-04-01 MED FILL — PRAVASTATIN 10 MG TAB: 10 mg | ORAL | Qty: 2

## 2017-04-01 MED FILL — TOPROL XL 25 MG TABLET,EXTENDED RELEASE: 25 mg | ORAL | Qty: 1

## 2017-04-01 MED FILL — LOVENOX 40 MG/0.4 ML SUBCUTANEOUS SYRINGE: 40 mg/0.4 mL | SUBCUTANEOUS | Qty: 0.4

## 2017-04-01 MED FILL — METHOCARBAMOL 500 MG TAB: 500 mg | ORAL | Qty: 1

## 2017-04-01 MED FILL — SPIRONOLACTONE 25 MG TAB: 25 mg | ORAL | Qty: 2

## 2017-04-01 MED FILL — POTASSIUM CHLORIDE SR 10 MEQ TAB: 10 mEq | ORAL | Qty: 1

## 2017-04-01 MED FILL — HEALTHYLAX 17 GRAM ORAL POWDER PACKET: 17 gram | ORAL | Qty: 1

## 2017-04-01 MED FILL — FUROSEMIDE 10 MG/ML IJ SOLN: 10 mg/mL | INTRAMUSCULAR | Qty: 8

## 2017-04-01 MED FILL — AMLODIPINE 5 MG TAB: 5 mg | ORAL | Qty: 1

## 2017-04-01 MED FILL — HYDROCODONE-ACETAMINOPHEN 5 MG-325 MG TAB: 5-325 mg | ORAL | Qty: 1

## 2017-04-01 MED FILL — ASPIRIN 81 MG TAB, DELAYED RELEASE: 81 mg | ORAL | Qty: 1

## 2017-04-01 NOTE — Progress Notes (Signed)
Occupational Therapy Goals  Routine 7 day Re-eval 03/23/2017, Goals 1-3 and goals 5 and 6 remain appropriate, goal 4 modified and goal 7 newly established as per 10/30 weekly Reevaluation.   ??  1.  Patient will perform grooming standing with supervision/set-up 7 days.   2.  Patient will participate in anterior bathing with Min A from seated position within 7 days.  3.  Patient will perform rolling in bed with Mod A x1 in preparation for toileting and hygiene within 7 days.  4.  Patient will perform toilet transfer to bedside commode with supervision within 7 days. Change to toilet or BSC with supervision as of 10/30.  5.  Patient will participate in all aspects of toileting with overall Mod A x1 within 7 days.  6.  Patient will complete upper body therapeutic exercises/activities to maximize independence and safety with ADLs for at least 5 minutes within 7 days.   7.  Patient will perform upper body dressing with supervision/setup, within 7 days. Established 10/30.    Occupational Therapy TREATMENT  Patient: Brittney Shelton [phone](66 y.o. female)  Date: 04/01/2017  Diagnosis: Acute respiratory failure with hypoxia (HCC) <principal problem not specified>      Precautions: Fall, DNR    ASSESSMENT:  Patient continues to be motivated and is receptive to all OT intervention. Mildly less SOB today, but still desaturating to as low as 82% on 6 L NC with activity, but recovers slowly with pursed lip breathing to 95%. Patient able to ambulate with RW to/from the bathroom at a CGA level, was CGA for standing grooming, and was min A for toileting and UB dressing. Standing balance remains fair for ADL and ambulation. Patient progressing and remains appropriate for SNF rehab with cardiopulmonary focus.   Progression toward goals:  []        Improving appropriately and progressing toward goals  [x]        Improving slowly and progressing toward goals  []        Not making progress toward goals and plan of care will be adjusted     PLAN:   Patient continues to benefit from skilled intervention to address the above impairments.  Continue treatment per established plan of care.  Discharge Recommendations:  Skilled Nursing Facility [facility] program)  Further Equipment Recommendations for Discharge:  TBD         OBJECTIVE DATA SUMMARY:   Cognitive/Behavioral Status:  Neurologic State: Alert  Orientation Level: Oriented X4  Cognition: Appropriate for age attention/concentration;Follows commands        Safety/Judgement: Decreased awareness of need for safety  Functional Mobility and Transfers for ADLs:Bed Mobility:   Presented up in chair     Scooting: Supervision  Transfers:  Sit to Stand: Contact guard assistance                Toilet Transfer : Contact guard assistance(from BSC over toilet)           Bathroom Mobility: Contact guard assistance(ambulating with RW)      Balance:  Sitting: Intact  Standing: Impaired  Standing - Static: Fair  Standing - Dynamic : Fair  ADL Intervention:  Grooming  Washing Hands: Contact guard assistance(standing )     Upper Body Dressing Assistance  Hospital Gown: Minimum  assistance(performed seated)    Toileting  Toileting Assistance: Minimum assistance  Bladder Hygiene: Supervision/set-up  Clothing Management: Minimum assistance  Cues: Verbal cues provided    Cognitive Retraining  Safety/Judgement: Decreased awareness of need for safety  Pain:  Pain Scale 1: Numeric (0 - 10)  Pain Intensity 1: 0  Pain Location 1: Knee;Back  Pain Orientation 1: Lower;Left  Pain Description 1: Aching  Pain Intervention(s) 1: Medication (see MAR)  Activity Tolerance:   Patient on 5 L NC t/o session. SpO2 as high as 95% at rest, and as low as 82% during activity. Patient recovers slowly with pursed lip breathing.     After treatment:   [x]  Patient left in no apparent distress sitting up in chair  []  Patient left in no apparent distress in bed  [x]  Call bell left within reach  [x]  Nursing notified  []  Caregiver present  []  Bed alarm activated     COMMUNICATION/COLLABORATION:   The patient???s plan of care was discussed with: Registered Nurse    Michel Harrow, OTR/L  Time Calculation: 45 mins

## 2017-04-01 NOTE — Progress Notes (Signed)
Nutrition Assessment:    INTERVENTIONS/RECOMMENDATIONS:   Meals/Snacks: General/healthful diet: Continue current diet    ASSESSMENT:   Chart reviewed; patient discussed during PCU rounds. Continues on a cardiac diet. Patient with excellent appetite/PO intake. Eating 80-100% of meals. BG well controlled. Phos slightly elevated yesterday, will monitor. For possible discharge tomorrow.     Diet Order: Cardiac  % Eaten:    Patient Vitals for the past 72 hrs:   % Diet Eaten   04/01/17 0853 80 %   03/31/17 0903 100 %     Pertinent Medications: '[x]'$  Reviewed '[]'$ Other: Norvasc, Lasix, Humalog, Miralax, KCl, Pravastatin, Aldactone   Pertinent Labs: '[x]'$ Reviewed  '[]'$ Other: BG 153-108-105-135, Phos 4.8 (10/30)   Food Allergies: '[x]'$ None '[]'$ Other:   Last BM: 10/30  '[x]'$ Active     '[]'$ Hyperactive  '[]'$ Hypoactive       '[]'$  Absent  BS  Skin:    '[x]'$  Intact   '[]'$  Incision  '[]'$  Breakdown   '[]'$ Edema   '[]'$ Other:  Anthropometrics: Height: '5\' 6"'$  (167.6 cm) Weight: 122 kg (268 lb 15.4 oz)    IBW (%IBW):   ( ) UBW (%UBW):   (  %)    BMI: Body mass index is 43.41 kg/m??.    This BMI is indicative of:  '[]'$ Underweight   '[]'$ Normal   '[]'$ Overweight   '[]'$  Obesity   '[x]'$  Extreme Obesity (BMI>40)  Last Weight Metrics:  Weight Loss Metrics 04/01/2017 03/11/2017 01/06/2017 12/17/2016 12/13/2016 12/10/2016 09/12/2016   Today's Wt 268 lb 15.4 oz - 298 lb 315 lb 300 lb 300 lb 289 lb 4.8 oz   BMI - 43.41 kg/m2 48.1 kg/m2 50.84 kg/m2 48.42 kg/m2 48.42 kg/m2 46.69 kg/m2       Estimated Nutrition Needs (Based on): 3710 Kcals/day(BMR (1767) x 1.3AF -500kcal) , 98 g(0.8 g/kg bw) Protein  Carbohydrate: At Least 130 g/day  Fluids: 1800 mL/day     Pt expected to meet estimated nutrient needs: '[x]'$ Yes '[]'$ No    NUTRITION DIAGNOSES:   Problem:  No nutritional diagnosis at this time      Etiology: related to       Signs/Symptoms: as evidenced by        NUTRITION INTERVENTIONS:  Meals/Snacks: General/healthful diet                  GOAL:   PO intake >75% of meals next 7 days     NUTRITION MONITORING AND EVALUATION   Food/Nutrient Intake Outcomes: Total energy intake  Physical Signs/Symptoms Outcomes: Weight/weight change, Glucose profile, Electrolyte and renal profile    Previous Goal Met:   '[x]'$  Met              '[]'$  Progressing Towards Goal              '[]'$  Not Progressing Towards Goal   Previous Recommendations:   '[x]'$  Implemented          '[]'$  Not Implemented          '[]'$  Not Applicable    LEARNING NEEDS (Diet, Food/Nutrient-Drug Interaction):    '[x]'$  None Identified   '[]'$  Identified and Education Provided/Documented   '[]'$  Identified and Pt declined/was not appropriate     Cultural, Religious, OR Ethnic Dietary Needs:    '[x]'$  None Identified   '[]'$  Identified and Addressed     '[x]'$  Interdisciplinary Care Plan Reviewed/Documented    '[x]'$  Discharge Planning: Heart healthy/consistnet carb diet    '[x]'$  Participated in Interdisciplinary Rounds    NUTRITION RISK:    '[]'$   High              [] Moderate           [x]  Low  []  Minimal/Uncompromised      Brittney Shelton  Pager [phone]              Weekend Pager [phone]

## 2017-04-01 NOTE — Progress Notes (Signed)
Problem: Pressure Injury - Risk of  Goal: *Prevention of pressure injury  Document Braden Scale and appropriate interventions in the flowsheet.   Outcome: Progressing Towards Goal  Pressure Injury Interventions:  Sensory Interventions: Assess changes in LOC    Moisture Interventions: Absorbent underpads, Assess need for specialty bed    Activity Interventions: Chair cushion    Mobility Interventions: Chair cushion, Float heels    Nutrition Interventions: Document food/fluid/supplement intake    Friction and Shear Interventions: Apply protective barrier, creams and emollients, HOB 30 degrees or less

## 2017-04-01 NOTE — Progress Notes (Signed)
Hospitalist Progress Note  NAME: Brittney Shelton   DOB:  [birth year]   MRN:  [id number]       Assessment / Plan:  Lumbar degenerative disc disese/back pain and right knee pain  XR spine, degenerative changes L3-S1, marked disc space narrowing at L4-L5, facet arthropathy L3-S1  PT/OT  S/p R knee arthrocentesis 10/21.  Appreciate ortho help with knee injections   PRN norco and tylenol for pain  Difficult to excalate pain meds due to severe COPD  Pain is controlled  Acute on chronic hypoxic respiratory failure POA with baseline 4-5L of O2 on NC today, able to D/c venti mask, c/w supplemental O2. Wean down as tolerated. EF 65% with diastolic dysfunction  Acute on chronic diastolic congestive heart failure in settings of moderate Pulmonary HTN (ProBNP with [id number] which is significantly elevated in morbidly obese pt) c/w Lasix IV, and Spironolactone, Weight is down, monitor. Keeping negative balances  Obesity hypoventilation suspected.  No copd per pulmonary.  PFT's done 2016 w/o airway obstruction and only smoked 1-2 years total.  Repeat Echo 10/11, EF 60 % to 65 %. There were no regional wall motion abnormalities, was moderate pulmonary hypertension.  Required high flow O2. Now on 6L O2.  Difficulty weaning O2 to baseline of 4L due to hypoxia.  Patient does not tolerate BIPAP/CPAP due to tight fitting of mask. She has been requiring venimask at night.   Palliative care consulted. Patient decided on DNR.  Pulmonary and cardiology consulted. Consults appreciated.  Pulmonary recommends outpatient PFT's and repeat CT chest.  Outpatient PFTs  Pulmonary nodules  -repeat CT chest in 1 year  DM2 - Diet Controlled  Last A1C on record 11/2016 is 5.8. No meds listed PTA  hyperglycemia likely related to Steroids  Continue SSI for now   HTN  Cont toprol and amlodipine   Hypokalemia  -supplement  HLD  Cont home pravastatin  Morbid obesity - due to excess weight.  Constipation   Cont laxative  Generalized weakness  -cont PT/OT   Code Status:  DNR  Surrogate Decision Maker: Justine NullShannon Boomer [phone]  DVT Prophylaxis: sq heparin  GI Prophylaxis: not indicated  Baseline: lives at home with family    D/C plan for tomorrow to SNF     Subjective: Pt seen and examined at bedside. Knee pain improving, breathing better. Overnight events d/w RN     Chief Complaint / Reason for Physician Visit: f/u "Respiratory Failure"  "I feel better"    Review of Systems:  Symptom Y/N Comments  Symptom Y/N Comments   Fever/Chills n   Chest Pain n    Poor Appetite    Edema y    Cough n   Abdominal Pain n    Sputum    Joint Pain y Mild    SOB/DOE    Pruritis/Rash     Nausea/vomit    Tolerating PT/OT     Diarrhea    Tolerating Diet y    Constipation    Other       Could NOT obtain due to:      Objective:     VITALS:   Last 24hrs VS reviewed since prior progress note. Most recent are:  Patient Vitals for the past 24 hrs:   Temp Pulse Resp BP SpO2   04/01/17 1119 97.6 ??F (36.4 ??C) 76 20 110/64 95 %   04/01/17 1009 97.7 ??F (36.5 ??C) 81 20 109/69 93 %   04/01/17 0713 98.1 ??F (36.7 ??C) 80 20  126/67 92 %   04/01/17 0524 97.8 ??F (36.6 ??C) 81 20 133/69 94 %   03/31/17 2259 97.8 ??F (36.6 ??C) 77 18 113/67 98 %   03/31/17 1910 97.4 ??F (36.3 ??C) 80 18 120/72 99 %   03/31/17 1704 98.3 ??F (36.8 ??C) 86 20 122/74 99 %   03/31/17 1424 98.3 ??F (36.8 ??C) 89 22 119/76 94 %       Intake/Output Summary (Last 24 hours) at 04/01/2017 1341  Last data filed at 04/01/2017 0853  Gross per 24 hour   Intake 545 ml   Output 1350 ml   Net -805 ml        PHYSICAL EXAM:  General: WD, WN. Alert, cooperative, no acute distress.   EENT:  EOMI. Anicteric sclerae. MMM  Resp:  Coarse BS, no wheezing or rales.  No accessory muscle use  CV:  Regular  rhythm,?? 2+ leg edema unchanged.   GI:  Soft, Non distended, Non tender. ??+Bowel sounds  Neurologic:?? Alert and oriented X 3, normal speech, at baseline  Psych:???? Good insight.??Not anxious nor agitated  Skin:  No rashes.  No jaundice     Reviewed most current lab test results and cultures  YES  Reviewed most current radiology test results   YES  Review and summation of old records today    NO  Reviewed patient's current orders and MAR    YES  PMH/SH reviewed - no change compared to H&P  ________________________________________________________________________  Care Plan discussed with:    Comments   Patient x    Family      RN x    Care Manager     Consultant                        Multidiciplinary team rounds were held today with case manager, nursing, pharmacist and Higher education careers adviser.  Patient's plan of care was discussed; medications were reviewed and discharge planning was addressed.     ________________________________________________________________________  Total NON critical care TIME: 25 Minutes    Total CRITICAL CARE TIME Spent:   Minutes non procedure based      Comments   >50% of visit spent in counseling and coordination of care y    ________________________________________________________________________  Hazeline Junker, MD     Procedures: see electronic medical records for all procedures/Xrays and details which were not copied into this note but were reviewed prior to creation of Plan.      LABS:  I reviewed today's most current labs and imaging studies.  Pertinent labs include:  Recent Labs     04/01/17  0527 03/31/17  0548   WBC 6.4 6.2   HGB 11.1* 11.2*   HCT 35.1 35.1   PLT 259 262     Recent Labs     04/01/17  0527 03/31/17  0548   NA 136 138   K 4.0 3.8   CL 98 98   CO2 34* 33*   GLU 105* 113*   BUN 29* 26*   CREA 0.83 0.73   CA 8.6 8.3*   MG 2.1 2.0   PHOS  --  4.8*   ALB 2.6*  --    TBILI 0.6  --    SGOT 8*  --    ALT 10*  --        Signed: Hazeline Junker, MD

## 2017-04-01 NOTE — Other (Addendum)
PCU SHIFT NURSING NOTE    Bedside shift change report given to Zach (Cabin crew) by Dagmar Hait (offgoing nurse). Report included the following information SBAR, Kardex, Intake/Output, MAR, Accordion and Recent Results.  1930: Bedside shift change report given to Stark Klein (Cabin crew) by Ian Malkin (offgoing nurse). Report included the following information SBAR, Kardex, Intake/Output, MAR, Accordion and Recent Results.       Shift Summary: 04/01/2017      Admission Date 03/11/2017   Admission Diagnosis Acute respiratory failure with hypoxia (HCC)   Consults IP CONSULT TO CARDIOLOGY  IP CONSULT TO PALLIATIVE CARE - PROVIDER  IP CONSULT TO INTENSIVIST  IP CONSULT TO ORTHOPEDIC SURGERY  IP CONSULT TO ORTHOPEDIC SURGERY        Consults   [x] PT   [x] OT   [] Speech   [] Case Management      []  Palliative      Cardiac Monitoring Order   [x] Yes   [] No     IV drips   [] Yes    Drip:                            Dose:  Drip:                            Dose:  Drip:                            Dose:   [x] No     GI Prophylaxis   [] Yes   [] No         DVT Prophylaxis   SCDs:             Ted stockings:         []  Medication   [] Contraindicated   [] None      Activity Level Activity Level: Up with Assistance     Activity Assistance: Partial (two people)   Purposeful Rounding every 1-2 hour?   [x] Yes   Schmidt Score  Total Score: 3   Bed Alarm (If score 3 or >)   [x] Yes   []  Refused (See signed refusal form in chart)   Braden Score  Braden Score: 17   Braden Score (if score 14 or less)   [x] PMT consult   [] Wound Care consult      [] Specialty bed   []  Nutrition consult          Needs prior to discharge:   Home O2 required:    [x] Yes   [] No    If yes, how much O2 required?    Other:    Last Bowel Movement: Last Bowel Movement Date: 03/31/17      Influenza Vaccine Received Flu Vaccine for Current Season (usually Sept-March): No    Patient/Guardian Refused (Notify MD): Yes   Pneumonia Vaccine           Diet Active Orders   Diet     DIET CARDIAC Regular      LDAs               Peripheral IV 03/31/17 Right Hand (Active)   Site Assessment Clean, dry, & intact 04/01/2017  5:24 AM   Phlebitis Assessment 0 04/01/2017  5:24 AM   Infiltration Assessment 0 04/01/2017  5:24 AM   Dressing Status Clean, dry, & intact 04/01/2017  5:24 AM   Dressing Type Tape;Transparent 04/01/2017  5:24 AM   Hub  Color/Line Status Pink;Capped;Flushed 04/01/2017  5:24 AM   Action Taken Open ports on tubing capped 04/01/2017  5:24 AM   Alcohol Cap Used Yes 04/01/2017  5:24 AM          External Female Catheter 03/12/17 (Active)   Site Assessment Clean, dry, & intact 04/01/2017  5:24 AM   Repositioned Yes 04/01/2017  5:24 AM   Perineal Care Yes 04/01/2017  5:24 AM   Wick Changed Yes 04/01/2017  5:24 AM   Suction Canister/Tubing Changed No 04/01/2017  5:24 AM   Urine Output (mL) 350 ml 04/01/2017  8:53 AM                Urinary Catheter      Intake & Output Date 03/31/17 0700 - 04/01/17 0659 04/01/17 0700 - 04/02/17 0659   Shift 0700-1859 1900-0659 24 Hour Total 0700-1859 1900-0659 24 Hour Total   INTAKE   P.O. 1080  1080 305  305     P.O. 1080  1080 305  305   Shift Total(mL/kg) 1080(8.7)  1080(8.9) 305(2.5)  305(2.5)   OUTPUT   Urine(mL/kg/hr) 1200(0.8) 500(0.3) 1700(0.6) 350  350     Urine Output (mL) (External Female Catheter 03/12/17) 1200 500 1700 350  350   Shift Total(mL/kg) 1200(9.6) 500(4.1) 1700(13.9) 350(2.9)  350(2.9)   NET -120 -500 -620 -45  -45   Weight (kg) 124.7 122 122 122 122 122         Readmission Risk Assessment Tool Score High Risk            36       Total Score        3 Has Seen PCP in Last 6 Months (Yes=3, No=0)    3 Patient Length of Stay (>5 days = 3)    4 IP Visits Last 12 Months (1-3=4, 4=9, >4=11)    9 Pt. Coverage (Medicare=5 , Medicaid, or Self-Pay=4)    17 Charlson Comorbidity Score (Age + Comorbid Conditions)        Criteria that do not apply:    Married. Living with Significant Other. Assisted Living. LTAC. SNF. or   Rehab        Expected Length of Stay 4d 2h   Actual Length of Stay 20

## 2017-04-01 NOTE — Progress Notes (Signed)
[facility]  Name: Brittney Shelton MRN: [id number]   DOB: 03-Dec-1950 Hospital: [facility]   Date: 04/01/2017        IMPRESSION:   ?? Acute/chronic hypoxic hypercapnic respiratory failure  ?? Volume overload/pulmonary edema  ?? Diastolic heart failure  ?? Likely OSA/OHS  ?? Pulmonary HTN secondary to diastolic dysfunction and likely untreated OSA/OHS  ?? Pulmonary nodules - stable from 2017  ?? HTN  ?? DM  ?? Obesity  ?? Minimal tobacco history (<1 p-y)      PLAN:   ?? O2 - baseline is 4 LPM  ?? Continue diuresis  ?? Discussed that she would benefit from a sleep study, but she does not think she would be compliant with CPAP  ?? Optimize her diastolic heart failure  ?? Needs a repeat CT in 1 year  ?? Insulin  ?? DVT prophylaxis     Subjective/Interval History:   I have reviewed the flowsheet and previous day???s notes.  Feeling better overall.  Some decrease in O2 requirements after diuresis  Review of Systems   Constitutional: Negative.    HENT: Negative.    Eyes: Negative.    Respiratory: Negative.    Cardiovascular: Positive for leg swelling.   Gastrointestinal: Negative.      Objective:  Vital Signs:    Visit Vitals  BP 126/67 (BP 1 Location: Right arm, BP Patient Position: At rest)   Pulse 80   Temp 98.1 ??F (36.7 ??C)   Resp 20   Ht 5\' 6"  (1.676 m)   Wt 122 kg (268 lb 15.4 oz)   SpO2 92%   BMI 43.41 kg/m??       O2 Device: Nasal cannula, Ventimask   O2 Flow Rate (L/min): 6 l/min   Temp (24hrs), Avg:98 ??F (36.7 ??C), Min:97.4 ??F (36.3 ??C), Max:98.3 ??F (36.8 ??C)     Intake/Output:   Last shift:      10/31 0701 - 10/31 1900  In: 50 [P.O.:50]  Out: -   Last 3 shifts: 10/29 1901 - 10/31 0700  In: 1080 [P.O.:1080]  Out: 2050 [Urine:2050]    Intake/Output Summary (Last 24 hours) at 04/01/2017 0854  Last data filed at 04/01/2017 0849  Gross per 24 hour   Intake 890 ml   Output 1300 ml   Net -410 ml      Physical Exam    Constitutional: She is oriented to person, place, and time. She is cooperative. No distress.   HENT:   Mouth/Throat: No oropharyngeal exudate.   Eyes: No scleral icterus.   Cardiovascular: Normal rate and regular rhythm.   No murmur heard.  Pulmonary/Chest: No respiratory distress. She has no wheezes. She has rales.   Abdominal: Soft. Bowel sounds are normal. She exhibits no distension. There is no tenderness.   Musculoskeletal: She exhibits edema.   Neurological: She is alert and oriented to person, place, and time.   Skin: Skin is warm and dry.     Data:   Labs:  Recent Labs     04/01/17  0527 03/31/17  0548   WBC 6.4 6.2   HGB 11.1* 11.2*   HCT 35.1 35.1   PLT 259 262     Recent Labs     04/01/17  0527 03/31/17  0548   NA 136 138   K 4.0 3.8   CL 98 98   CO2 34* 33*   GLU 105* 113*   BUN 29*  26*   CREA 0.83 0.73   CA 8.6 8.3*   MG 2.1 2.0   PHOS  --  4.8*   ALB 2.6*  --    TBILI 0.6  --    SGOT 8*  --    ALT 10*  --      No results for input(s): PH, PCO2, PO2, HCO3, FIO2 in the last 72 hours.    Imaging:  I have personally reviewed the patient???s radiographs:  None today        Lendon KaJeremy G Nichele Slawson, MD

## 2017-04-01 NOTE — Progress Notes (Signed)
Problem: Falls - Risk of  Goal: *Absence of Falls  Document Schmid Fall Risk and appropriate interventions in the flowsheet.   Outcome: Progressing Towards Goal  Fall Risk Interventions:  Mobility Interventions: Patient to call before getting OOB    Mentation Interventions: Gait belt with transfers/ambulation    Medication Interventions: Assess postural VS orthostatic hypotension    Elimination Interventions: Call light in reach, Toileting schedule/hourly rounds

## 2017-04-02 LAB — GLUCOSE, POC
Glucose (POC): 141 mg/dL — ABNORMAL HIGH (ref 65–100)
Glucose (POC): 129 mg/dL — ABNORMAL HIGH (ref 65–100)
Glucose (POC): 118 mg/dL — ABNORMAL HIGH (ref 65–100)
Glucose (POC): 137 mg/dL — ABNORMAL HIGH (ref 65–100)

## 2017-04-02 LAB — CBC W/O DIFF
ABSOLUTE NRBC: 0 10*3/uL (ref 0.00–0.01)
HCT: 34.4 % — ABNORMAL LOW (ref 35.0–47.0)
HGB: 11.1 g/dL — ABNORMAL LOW (ref 11.5–16.0)
MCH: 28.3 PG (ref 26.0–34.0)
MCHC: 32.3 g/dL (ref 30.0–36.5)
MCV: 87.8 FL (ref 80.0–99.0)
MPV: 11.1 FL (ref 8.9–12.9)
NRBC: 0 PER 100 WBC
PLATELET: 259 10*3/uL (ref 150–400)
RBC: 3.92 M/uL (ref 3.80–5.20)
RDW: 18.2 % — ABNORMAL HIGH (ref 11.5–14.5)
WBC: 7.1 10*3/uL (ref 3.6–11.0)

## 2017-04-02 LAB — METABOLIC PANEL, BASIC
Anion gap: 7 mmol/L (ref 5–15)
BUN/Creatinine ratio: 34 — ABNORMAL HIGH (ref 12–20)
BUN: 31 MG/DL — ABNORMAL HIGH (ref 6–20)
CO2: 31 mmol/L (ref 21–32)
Calcium: 8.5 MG/DL (ref 8.5–10.1)
Chloride: 99 mmol/L (ref 97–108)
Creatinine: 0.92 MG/DL (ref 0.55–1.02)
GFR est AA: >60 mL/min/{1.73_m2} (ref >60)
GFR est non-AA: >60 mL/min/{1.73_m2} (ref >60)
Glucose: 102 mg/dL — ABNORMAL HIGH (ref 65–100)
Potassium: 4 mmol/L (ref 3.5–5.1)
Sodium: 137 mmol/L (ref 136–145)

## 2017-04-02 LAB — MAGNESIUM: Magnesium: 2 mg/dL (ref 1.6–2.4)

## 2017-04-02 LAB — PHOSPHORUS: Phosphorus: 4.7 MG/DL (ref 2.6–4.7)

## 2017-04-02 MED FILL — FUROSEMIDE 10 MG/ML IJ SOLN: 10 mg/mL | INTRAMUSCULAR | Qty: 8

## 2017-04-02 MED FILL — METHOCARBAMOL 500 MG TAB: 500 mg | ORAL | Qty: 1

## 2017-04-02 MED FILL — MAPAP (ACETAMINOPHEN) 325 MG TABLET: 325 mg | ORAL | Qty: 2

## 2017-04-02 MED FILL — HEALTHYLAX 17 GRAM ORAL POWDER PACKET: 17 gram | ORAL | Qty: 1

## 2017-04-02 MED FILL — PRAVASTATIN 10 MG TAB: 10 mg | ORAL | Qty: 2

## 2017-04-02 MED FILL — AMLODIPINE 5 MG TAB: 5 mg | ORAL | Qty: 1

## 2017-04-02 MED FILL — POTASSIUM CHLORIDE SR 10 MEQ TAB: 10 mEq | ORAL | Qty: 1

## 2017-04-02 MED FILL — HYDROCODONE-ACETAMINOPHEN 5 MG-325 MG TAB: 5-325 mg | ORAL | Qty: 1

## 2017-04-02 MED FILL — SPIRONOLACTONE 25 MG TAB: 25 mg | ORAL | Qty: 2

## 2017-04-02 MED FILL — LOVENOX 40 MG/0.4 ML SUBCUTANEOUS SYRINGE: 40 mg/0.4 mL | SUBCUTANEOUS | Qty: 0.4

## 2017-04-02 MED FILL — TOPROL XL 25 MG TABLET,EXTENDED RELEASE: 25 mg | ORAL | Qty: 1

## 2017-04-02 MED FILL — ASPIRIN 81 MG TAB, DELAYED RELEASE: 81 mg | ORAL | Qty: 1

## 2017-04-02 NOTE — Progress Notes (Signed)
Discussed with Dr Glee ArvinMencias , goals are clear , plan to be discharged to SNF , patient is in hospital  DNR ( in patient ), decline to sign DDNR .    We will sign off .     Thank you for including palliative care in the care of this patient .    Darla Leschesahira Aydeen Blume MD    Palliative care team   [phone]

## 2017-04-02 NOTE — Progress Notes (Signed)
Chart reviewed, spoke with nursing to clear patient for participation in PT treatment. Viewed telemetry, patient's SpO2 is 57% on 6L O2. Nursing begins to work with patient, including reminding her of pursed-lip breathing, SpO2 is currently up to 77%, but will defer PT treatment at this time. Will check back later today, if time, or resume PT treatments tomorrow, 04/03/2017.

## 2017-04-02 NOTE — Other (Addendum)
PCU SHIFT NURSING NOTE    Bedside shift change report given to Ian Malkin (Cabin crew) by Stark Klein (offgoing nurse). Report included the following information SBAR, Kardex, Intake/Output, MAR, Accordion and Recent Results.  1130: Pt on 4l NC and droping below 90%  1146: Pt on special airlyzer NC at 92% at 15L  1229: On airlyzer NC 10L pt at 100% will continue to decrease and place back on NC  1915: Bedside shift change report given to [employer] (Cabin crew) by Ian Malkin (offgoing nurse). Report included the following information SBAR, Kardex, Intake/Output, MAR, Accordion and Recent Results.       Shift Summary: 04/02/2017      Admission Date 03/11/2017   Admission Diagnosis Acute respiratory failure with hypoxia (HCC)   Consults IP CONSULT TO CARDIOLOGY  IP CONSULT TO PALLIATIVE CARE - PROVIDER  IP CONSULT TO INTENSIVIST  IP CONSULT TO ORTHOPEDIC SURGERY  IP CONSULT TO ORTHOPEDIC SURGERY        Consults   [x] PT   [x] OT   [] Speech   [] Case Management      []  Palliative      Cardiac Monitoring Order   [x] Yes   [] No     IV drips   [] Yes    Drip:                            Dose:  Drip:                            Dose:  Drip:                            Dose:   [x] No     GI Prophylaxis   [] Yes   [] No         DVT Prophylaxis   SCDs:             Ted stockings:         [x]  Medication   [] Contraindicated   [] None      Activity Level Activity Level: Chair     Activity Assistance: Partial (one person)   Purposeful Rounding every 1-2 hour?   [x] Yes   Noreene Filbert Score  Total Score: 3   Bed Alarm (If score 3 or >)   [x] Yes   []  Refused (See signed refusal form in chart)   Braden Score  Braden Score: 16   Braden Score (if score 14 or less)   [] PMT consult   [] Wound Care consult      [] Specialty bed   []  Nutrition consult          Needs prior to discharge:   Home O2 required:    [x] Yes   [] No    If yes, how much O2 required?    Other:    Last Bowel Movement: Last Bowel Movement Date: 03/31/17       Influenza Vaccine Received Flu Vaccine for Current Season (usually Sept-March): No    Patient/Guardian Refused (Notify MD): Yes   Pneumonia Vaccine           Diet Active Orders   Diet    DIET CARDIAC Regular      LDAs               Peripheral IV 03/31/17 Right Hand (Active)   Site Assessment Clean, dry, & intact 04/01/2017  3:57 PM   Phlebitis Assessment 0  04/01/2017  3:57 PM   Infiltration Assessment 0 04/01/2017  3:57 PM   Dressing Status Clean, dry, & intact 04/01/2017  3:57 PM   Dressing Type Tape;Transparent 04/01/2017  3:57 PM   Hub Color/Line Status Pink;Capped;Flushed 04/01/2017  3:57 PM   Action Taken Open ports on tubing capped 04/01/2017  3:57 PM   Alcohol Cap Used Yes 04/01/2017  3:57 PM          External Female Catheter 03/12/17 (Active)   Site Assessment Clean, dry, & intact 04/01/2017  3:57 PM   Repositioned No 04/01/2017  3:57 PM   Perineal Care No 04/01/2017  3:57 PM   Wick Changed Yes 04/01/2017  3:57 PM   Suction Canister/Tubing Changed Yes 04/01/2017  3:57 PM   Urine Output (mL) 220 ml 04/01/2017  6:16 PM                Urinary Catheter      Intake & Output Date 04/01/17 0700 - 04/02/17 0659 04/02/17 0700 - 04/03/17 0659   Shift 0700-1859 1900-0659 24 Hour Total 0700-1859 1900-0659 24 Hour Total   INTAKE   P.O. 665  665        P.O. 665  665      Shift Total(mL/kg) 665(5.5)  665(5.5)      OUTPUT   Urine(mL/kg/hr) 570(0.4) 950(0.7) 1520(0.5)        Urine Voided  950 950        Urine Occurrence(s) 1 x 1 x 2 x        Urine Output (mL) (External Female Catheter 03/12/17) 570  570      Shift Total(mL/kg) 570(4.7) 950(7.8) 1520(12.5)      NET 95 -950 -855      Weight (kg) 122 121.5 121.5 121.5 121.5 121.5         Readmission Risk Assessment Tool Score High Risk            36       Total Score        3 Has Seen PCP in Last 6 Months (Yes=3, No=0)    3 Patient Length of Stay (>5 days = 3)    4 IP Visits Last 12 Months (1-3=4, 4=9, >4=11)    9 Pt. Coverage (Medicare=5 , Medicaid, or Self-Pay=4)     17 Charlson Comorbidity Score (Age + Comorbid Conditions)        Criteria that do not apply:    Married. Living with Significant Other. Assisted Living. LTAC. SNF. or   Rehab       Expected Length of Stay 4d 2h   Actual Length of Stay 21

## 2017-04-02 NOTE — Progress Notes (Signed)
Hospitalist Progress Note  NAME: Brittney Shelton   DOB:  04/02/1951   MRN:  [id number]       Assessment / Plan:  Lumbar degenerative disc disese/back pain and right knee pain  XR spine, degenerative changes L3-S1, marked disc space narrowing at L4-L5, facet arthropathy L3-S1  PT/OT  S/p R knee arthrocentesis 10/21.  Appreciate ortho help with knee injections   PRN norco and tylenol for pain  Difficult to excalate pain meds due to severe COPD  Pain is controlled  Acute on chronic hypoxic respiratory failure POA with baseline 4-5L of O2 on NC today, able to D/c venti mask, c/w supplemental O2. Wean down as tolerated. EF 65% with diastolic dysfunction, doing better, will try to get down to [facility] with goal to keep O2 sat above 90  Acute on chronic diastolic congestive heart failure in settings of moderate Pulmonary HTN (ProBNP with 40981 which is significantly elevated in morbidly obese pt) c/w Lasix IV, and Spironolactone, Weight is down, monitor. Keeping negative balances, patient keeps improving  Obesity hypoventilation suspected.  No copd per pulmonary.  PFT's done 2016 w/o airway obstruction and only smoked 1-2 years total.  Repeat Echo 10/11, EF 60 % to 65 %. There were no regional wall motion abnormalities, was moderate pulmonary hypertension.  Required high flow O2. Now on 6L O2.  Difficulty weaning O2 to baseline of 4L due to hypoxia.  Patient does not tolerate BIPAP/CPAP due to tight fitting of mask. She has been requiring venimask at night.   Palliative care consulted. Patient decided on DNR.  Pulmonary and cardiology consulted. Consults appreciated.  Pulmonary recommends outpatient PFT's and repeat CT chest.  Outpatient PFTs  Pulmonary nodules  -repeat CT chest in 1 year  DM2 - Diet Controlled  Last A1C on record 11/2016 is 5.8. No meds listed PTA  hyperglycemia likely related to Steroids  Continue SSI for now   HTN  Cont toprol and amlodipine   Hypokalemia  -supplement  HLD  Cont home pravastatin   Morbid obesity - due to excess weight.  Constipation   Cont laxative  Generalized weakness  -cont PT/OT  Code Status:  DNR  Surrogate Decision Maker: Justine Null [phone]  DVT Prophylaxis: sq heparin  GI Prophylaxis: not indicated  Baseline: lives at home with family  D/C plan for tomorrow to SNF if stable and able to keep O2 sat above 90% on 4LNC     Subjective: Pt seen and examined at bedside. Knee pain improving, breathing better. Overnight events d/w RN     Chief Complaint / Reason for Physician Visit: f/u "Respiratory Failure"  "I feel ready to go"    Review of Systems:  Symptom Y/N Comments  Symptom Y/N Comments   Fever/Chills n   Chest Pain n    Poor Appetite    Edema y    Cough n   Abdominal Pain n    Sputum    Joint Pain y Mild    SOB/DOE    Pruritis/Rash     Nausea/vomit    Tolerating PT/OT     Diarrhea    Tolerating Diet y    Constipation    Other       Could NOT obtain due to:      Objective:     VITALS:   Last 24hrs VS reviewed since prior progress note. Most recent are:  Patient Vitals for the past 24 hrs:   Temp Pulse Resp BP SpO2   04/02/17 0746 ??? ??? ??? ???  95 %   04/02/17 0743 97.4 ??F (36.3 ??C) 85 18 112/63 95 %   04/02/17 0742 97.4 ??F (36.3 ??C) 86 18 112/63 98 %   04/02/17 0549 97.9 ??F (36.6 ??C) 83 18 122/69 93 %   04/02/17 0335 ??? ??? ??? 122/69 ???   04/02/17 0153 97.6 ??F (36.4 ??C) 89 18 102/68 91 %   04/01/17 2215 97.6 ??F (36.4 ??C) 88 18 118/77 95 %   04/01/17 1810 ??? 90 ??? ??? ???   04/01/17 1505 97.9 ??F (36.6 ??C) 83 18 120/72 95 %       Intake/Output Summary (Last 24 hours) at 04/02/2017 1126  Last data filed at 04/02/2017 0908  Gross per 24 hour   Intake 958 ml   Output 1570 ml   Net -612 ml        PHYSICAL EXAM:  General: WD, WN. Alert, cooperative, no acute distress.   EENT:  EOMI. Anicteric sclerae. MMM  Resp:  Coarse BS, no wheezing or rales.  No accessory muscle use  CV:  Regular  rhythm,?? + edema, improving  GI:  Soft, Non distended, Non tender. ??+Bowel sounds   Neurologic:?? Alert and oriented X 3, normal speech, at baseline  Psych:???? Good insight.??Not anxious nor agitated  Skin:  No rashes.  No jaundice    Reviewed most current lab test results and cultures  YES  Reviewed most current radiology test results   YES  Review and summation of old records today    NO  Reviewed patient's current orders and MAR    YES  PMH/SH reviewed - no change compared to H&P  ________________________________________________________________________  Care Plan discussed with:    Comments   Patient x    Family      RN x    Care Manager     Consultant                        Multidiciplinary team rounds were held today with case manager, nursing, pharmacist and Higher education careers adviserclinical coordinator.  Patient's plan of care was discussed; medications were reviewed and discharge planning was addressed.     ________________________________________________________________________  Total NON critical care TIME: 25 Minutes    Total CRITICAL CARE TIME Spent:   Minutes non procedure based      Comments   >50% of visit spent in counseling and coordination of care y    ________________________________________________________________________  Hazeline Junkerarlos J Mencias, MD     Procedures: see electronic medical records for all procedures/Xrays and details which were not copied into this note but were reviewed prior to creation of Plan.      LABS:  I reviewed today's most current labs and imaging studies.  Pertinent labs include:  Recent Labs     04/02/17  0200 04/01/17  0527 03/31/17  0548   WBC 7.1 6.4 6.2   HGB 11.1* 11.1* 11.2*   HCT 34.4* 35.1 35.1   PLT 259 259 262     Recent Labs     04/02/17  0200 04/01/17  0527 03/31/17  0548   NA 137 136 138   K 4.0 4.0 3.8   CL 99 98 98   CO2 31 34* 33*   GLU 102* 105* 113*   BUN 31* 29* 26*   CREA 0.92 0.83 0.73   CA 8.5 8.6 8.3*   MG 2.0 2.1 2.0   PHOS 4.7  --  4.8*   ALB  --  2.6*  --  TBILI  --  0.6  --    SGOT  --  8*  --    ALT  --  10*  --        Signed: Hazeline Junker, MD

## 2017-04-02 NOTE — Progress Notes (Addendum)
Bedside and Verbal shift change report given to Carollee HerterShannon, RN (oncoming nurse) by Ian MalkinZach B.,RN (offgoing nurse). Report included the following information SBAR, Kardex, Intake/Output, MAR and Recent Results. Patient resting comfortably, side rails up, call bell w/in reach, no further needs expressed at this time, aware of POC.    0730 Verbal shift change report given to Genevie CheshireBilly B.,RN (oncoming nurse) by Carollee HerterShannon, RN (offgoing nurse). Report included the following information SBAR, Kardex and Recent Results.

## 2017-04-02 NOTE — Progress Notes (Signed)
Problem: Pressure Injury - Risk of  Goal: *Prevention of pressure injury  Document Braden Scale and appropriate interventions in the flowsheet.   Outcome: Progressing Towards Goal  Pressure Injury Interventions:  Sensory Interventions: Assess changes in LOC    Moisture Interventions: Absorbent underpads, Apply protective barrier, creams and emollients    Activity Interventions: Increase time out of bed, Pressure redistribution bed/mattress(bed type)    Mobility Interventions: Chair cushion, Float heels    Nutrition Interventions: Document food/fluid/supplement intake    Friction and Shear Interventions: Apply protective barrier, creams and emollients, HOB 30 degrees or less, Lift sheet

## 2017-04-02 NOTE — Progress Notes (Signed)
Problem: Falls - Risk of  Goal: *Absence of Falls  Document Schmid Fall Risk and appropriate interventions in the flowsheet.   Outcome: Progressing Towards Goal  Fall Risk Interventions:  Mobility Interventions: Patient to call before getting OOB    Mentation Interventions: Gait belt with transfers/ambulation    Medication Interventions: Patient to call before getting OOB    Elimination Interventions: Call light in reach, Toileting schedule/hourly rounds

## 2017-04-02 NOTE — Progress Notes (Signed)
[facility]  Name: Brittney Shelton MRN: [id number]   DOB: 12/28/1950 [facility]   Date: 04/02/2017        IMPRESSION:   ?? Acute/chronic hypoxic hypercapnic respiratory failure  ?? Volume overload/pulmonary edema  ?? Diastolic heart failure  ?? Likely OSA/OHS  ?? Pulmonary HTN secondary to diastolic dysfunction and likely untreated OSA/OHS  ?? Pulmonary nodules - stable from 2017  ?? HTN  ?? DM  ?? Obesity  ?? Minimal tobacco history (<1 p-y)      PLAN:   ?? O2 - baseline is 4 LPM  ?? Continue diuresis  ?? Discussed that she would benefit from a sleep study, but she does not think she would be compliant with CPAP   ?? Optimize her diastolic heart failure  ?? Needs a repeat CT in 1 year  ?? Insulin  ?? DVT prophylaxis     Subjective/Interval History:   I have reviewed the flowsheet and previous day???s notes.  Less O2 requirement, though still desaturates on 6 LPM when sleeping due to her untreated OSA  Review of Systems   Constitutional: Negative.    HENT: Negative.    Eyes: Negative.    Respiratory: Negative.    Cardiovascular: Positive for leg swelling.   Gastrointestinal: Negative.      Objective:  Vital Signs:    Visit Vitals  BP 112/63 (BP 1 Location: Left arm, BP Patient Position: Sitting)   Pulse 85   Temp 97.4 ??F (36.3 ??C)   Resp 18   Ht 5\' 6"  (1.676 m)   Wt 121.5 kg (267 lb 13.7 oz)   SpO2 95%   BMI 43.23 kg/m??       O2 Device: Nasal cannula   O2 Flow Rate (L/min): 6 l/min   Temp (24hrs), Avg:97.6 ??F (36.4 ??C), Min:97.4 ??F (36.3 ??C), Max:97.9 ??F (36.6 ??C)     Intake/Output:   Last shift:      No intake/output data recorded.  Last 3 shifts: 10/30 1901 - 11/01 0700  In: 665 [P.O.:665]  Out: 2020 [Urine:2020]    Intake/Output Summary (Last 24 hours) at 04/02/2017 0901  Last data filed at 04/02/2017 [id number]  Gross per 24 hour   Intake 360 ml   Output 1170 ml   Net -810 ml      Physical Exam    Constitutional: She is oriented to person, place, and time. She is cooperative. No distress.   HENT:   Mouth/Throat: No oropharyngeal exudate.   Eyes: No scleral icterus.   Cardiovascular: Normal rate and regular rhythm.   No murmur heard.  Pulmonary/Chest: No respiratory distress. She has no wheezes. She has rales.   Abdominal: Soft. Bowel sounds are normal. She exhibits no distension. There is no tenderness.   Musculoskeletal: She exhibits edema.   Neurological: She is alert and oriented to person, place, and time.   Skin: Skin is warm and dry.     Data:   Labs:  Recent Labs     04/02/17  0200 04/01/17  0527 03/31/17  0548   WBC 7.1 6.4 6.2   HGB 11.1* 11.1* 11.2*   HCT 34.4* 35.1 35.1   PLT 259 259 262     Recent Labs     04/02/17  0200 04/01/17  0527 03/31/17  0548   NA 137 136 138   K 4.0 4.0 3.8   CL 99 98 98   CO2 31 34* 33*  GLU 102* 105* 113*   BUN 31* 29* 26*   CREA 0.92 0.83 0.73   CA 8.5 8.6 8.3*   MG 2.0 2.1 2.0   PHOS 4.7  --  4.8*   ALB  --  2.6*  --    TBILI  --  0.6  --    SGOT  --  8*  --    ALT  --  10*  --      No results for input(s): PH, PCO2, PO2, HCO3, FIO2 in the last 72 hours.    Imaging:  I have personally reviewed the patient???s radiographs:  None today        Lendon Ka, MD

## 2017-04-02 NOTE — Progress Notes (Addendum)
Notes in chart from last week (03/23/17) patient was approved for [facility] when medically stable.

## 2017-04-03 LAB — METABOLIC PANEL, BASIC
Anion gap: 7 mmol/L (ref 5–15)
BUN/Creatinine ratio: 35 — ABNORMAL HIGH (ref 12–20)
BUN: 33 MG/DL — ABNORMAL HIGH (ref 6–20)
CO2: 32 mmol/L (ref 21–32)
Calcium: 8.6 MG/DL (ref 8.5–10.1)
Chloride: 98 mmol/L (ref 97–108)
Creatinine: 0.95 MG/DL (ref 0.55–1.02)
GFR est AA: >60 mL/min/{1.73_m2} (ref >60)
GFR est non-AA: 59 mL/min/{1.73_m2} — ABNORMAL LOW (ref >60)
Glucose: 101 mg/dL — ABNORMAL HIGH (ref 65–100)
Potassium: 4 mmol/L (ref 3.5–5.1)
Sodium: 137 mmol/L (ref 136–145)

## 2017-04-03 LAB — MAGNESIUM: Magnesium: 2.2 mg/dL (ref 1.6–2.4)

## 2017-04-03 LAB — GLUCOSE, POC
Glucose (POC): 133 mg/dL — ABNORMAL HIGH (ref 65–100)
Glucose (POC): 102 mg/dL — ABNORMAL HIGH (ref 65–100)
Glucose (POC): 162 mg/dL — ABNORMAL HIGH (ref 65–100)
Glucose (POC): 140 mg/dL — ABNORMAL HIGH (ref 65–100)

## 2017-04-03 LAB — PHOSPHORUS: Phosphorus: 5.1 MG/DL — ABNORMAL HIGH (ref 2.6–4.7)

## 2017-04-03 MED ORDER — HYDROCODONE-ACETAMINOPHEN 5 MG-325 MG TAB
5-325 mg | ORAL_TABLET | Freq: Four times a day (QID) | ORAL | 0 refills | Status: DC | PRN
Start: 2017-04-03 — End: 2017-04-30

## 2017-04-03 MED ORDER — AMLODIPINE 5 MG TAB
5 mg | ORAL_TABLET | Freq: Every day | ORAL | 1 refills | Status: DC
Start: 2017-04-03 — End: 2017-04-30

## 2017-04-03 MED ORDER — ACETAMINOPHEN 325 MG TABLET
325 mg | ORAL_TABLET | Freq: Four times a day (QID) | ORAL | 0 refills | Status: DC | PRN
Start: 2017-04-03 — End: 2017-05-01

## 2017-04-03 MED ORDER — SPIRONOLACTONE 50 MG TAB
50 mg | ORAL_TABLET | Freq: Every day | ORAL | 1 refills | Status: DC
Start: 2017-04-03 — End: 2017-04-30

## 2017-04-03 MED ORDER — FUROSEMIDE 80 MG TAB
80 mg | ORAL_TABLET | Freq: Two times a day (BID) | ORAL | 1 refills | Status: DC
Start: 2017-04-03 — End: 2017-04-30

## 2017-04-03 MED ORDER — METHOCARBAMOL 500 MG TAB
500 mg | ORAL_TABLET | Freq: Three times a day (TID) | ORAL | 0 refills | Status: DC | PRN
Start: 2017-04-03 — End: 2017-04-30

## 2017-04-03 MED ORDER — POLYETHYLENE GLYCOL 3350 17 GRAM (100 %) ORAL POWDER PACKET
17 gram | Freq: Every day | ORAL | 1 refills | Status: DC
Start: 2017-04-03 — End: 2017-04-21

## 2017-04-03 MED ORDER — METOPROLOL SUCCINATE SR 25 MG 24 HR TAB
25 mg | ORAL_TABLET | Freq: Every day | ORAL | 1 refills | Status: DC
Start: 2017-04-03 — End: 2017-04-30

## 2017-04-03 MED ORDER — POTASSIUM CHLORIDE SR 10 MEQ TAB
10 mEq | ORAL_TABLET | Freq: Two times a day (BID) | ORAL | 1 refills | Status: DC
Start: 2017-04-03 — End: 2017-04-30

## 2017-04-03 MED ORDER — IPRATROPIUM-ALBUTEROL 2.5 MG-0.5 MG/3 ML NEB SOLUTION
2.5 mg-0.5 mg/3 ml | INHALATION_SOLUTION | Freq: Four times a day (QID) | RESPIRATORY_TRACT | 1 refills | Status: AC | PRN
Start: 2017-04-03 — End: ?

## 2017-04-03 MED FILL — HEALTHYLAX 17 GRAM ORAL POWDER PACKET: 17 gram | ORAL | Qty: 1

## 2017-04-03 MED FILL — POTASSIUM CHLORIDE SR 10 MEQ TAB: 10 mEq | ORAL | Qty: 1

## 2017-04-03 MED FILL — TOPROL XL 25 MG TABLET,EXTENDED RELEASE: 25 mg | ORAL | Qty: 1

## 2017-04-03 MED FILL — FUROSEMIDE 10 MG/ML IJ SOLN: 10 mg/mL | INTRAMUSCULAR | Qty: 8

## 2017-04-03 MED FILL — PRAVASTATIN 10 MG TAB: 10 mg | ORAL | Qty: 2

## 2017-04-03 MED FILL — ASPIRIN 81 MG TAB, DELAYED RELEASE: 81 mg | ORAL | Qty: 1

## 2017-04-03 MED FILL — METHOCARBAMOL 500 MG TAB: 500 mg | ORAL | Qty: 1

## 2017-04-03 MED FILL — LOVENOX 40 MG/0.4 ML SUBCUTANEOUS SYRINGE: 40 mg/0.4 mL | SUBCUTANEOUS | Qty: 0.4

## 2017-04-03 MED FILL — HYDROCODONE-ACETAMINOPHEN 5 MG-325 MG TAB: 5-325 mg | ORAL | Qty: 1

## 2017-04-03 MED FILL — SPIRONOLACTONE 25 MG TAB: 25 mg | ORAL | Qty: 2

## 2017-04-03 MED FILL — AMLODIPINE 5 MG TAB: 5 mg | ORAL | Qty: 1

## 2017-04-03 MED FILL — MAPAP (ACETAMINOPHEN) 325 MG TABLET: 325 mg | ORAL | Qty: 2

## 2017-04-03 NOTE — Progress Notes (Signed)
Hospitalist Progress Note  NAME: Brittney Shelton   DOB:  [birth year]   MRN:  [id number]       Assessment / Plan:  Lumbar degenerative disc disese/back pain and right knee pain  XR spine, degenerative changes??L3-S1, marked disc space narrowing at L4-L5, facet arthropathy L3-S1  PT/OT  S/p R knee arthrocentesis 10/21.  Appreciate ortho help with knee injections   PRN norco and tylenol for pain  Difficult to excalate pain meds due to severe COPD  Pain is controlled  Acute on chronic hypoxic respiratory failure POA with baseline 4-5L of O2 on NC today, able to D/c venti mask, c/w supplemental O2. Wean down as tolerated. EF 65% with diastolic dysfunction, doing better, will [facility] get [facility] to [facility] with goal to keep O2 sat above 90  Acute on chronic diastolic congestive heart failure in settings of moderate Pulmonary HTN (ProBNP with [id number] which is significantly elevated in morbidly obese pt) c/w Lasix IV, and Spironolactone, Weight is down, monitor. Keeping negative balances, patient keeps improving  Obesity hypoventilation suspected.  No copd per pulmonary. ??PFT's done 2016 w/o airway obstruction and only smoked 1-2 years total.  Repeat Echo 10/11, EF 60 % to 65 %. There were no regional wall motion abnormalities, was moderate pulmonary hypertension.  Required high flow O2. Now on 5L O2. ??Difficulty weaning O2 to baseline of 4L due to hypoxia. ??Patient does not tolerate BIPAP/CPAP due to tight fitting of mask. She has been requiring venimask at night.   Palliative care consulted. Patient decided on DNR.  Pulmonary and cardiology consulted. Consults appreciated.  Pulmonary recommends outpatient PFT's and repeat CT chest.  Outpatient PFTs  Pulmonary nodules  -repeat CT chest in 1 year  DM2 - Diet Controlled  Last A1C on record 11/2016 is 5.8. No meds listed PTA  hyperglycemia likely related to Steroids  Continue SSI for now   HTN????Cont toprol and amlodipine , Allergy to ACE  Hypokalemia  -supplement   HLD????Cont home pravastatin  Morbid obesity??- due to excess weight.  Constipation   Cont laxative  Generalized weakness  -cont PT/OT  Code Status: ??DNR  Surrogate Decision Maker: Justine NullShannon Boomer [phone]  DVT Prophylaxis: sq heparin  GI Prophylaxis: not indicated  Baseline: lives at home with family  [facility] but will accept tomorrow as per CM.     Subjective: Pt seen and examined at bedside. Knee pain improving, breathing better. Overnight events d/w RN     Chief Complaint / Reason for Physician Visit: f/u "Respiratory Failure"  "I feel good"    Review of Systems:  Symptom Y/N Comments  Symptom Y/N Comments   Fever/Chills n   Chest Pain n    Poor Appetite    Edema y    Cough n   Abdominal Pain n    Sputum    Joint Pain y Mild    SOB/DOE    Pruritis/Rash     Nausea/vomit    Tolerating PT/OT     Diarrhea    Tolerating Diet y    Constipation    Other       Could NOT obtain due to:      Objective:     VITALS:   Last 24hrs VS reviewed since prior progress note. Most recent are:  Patient Vitals for the past 24 hrs:   Temp Pulse Resp BP SpO2   04/03/17 0715 97.7 ??F (36.5 ??C) 88 22 141/69 97 %  04/03/17 0600 ??? 96 ??? ??? 92 %   04/03/17 0503 ??? 87 ??? ??? 94 %   04/03/17 0400 ??? 86 ??? ??? 97 %   04/03/17 0321 98.1 ??F (36.7 ??C) 90 24 124/57 92 %   04/03/17 0304 ??? 88 ??? ??? 93 %   04/03/17 0200 ??? 93 ??? ??? 92 %   04/03/17 0100 ??? 87 ??? ??? 94 %   04/03/17 0020 ??? 94 ??? ??? 92 %   04/02/17 2308 97.8 ??F (36.6 ??C) 89 20 121/59 93 %   04/02/17 2209 ??? 89 ??? ??? 95 %   04/02/17 2104 ??? 86 ??? ??? 92 %   04/02/17 2006 98.2 ??F (36.8 ??C) 91 24 135/68 94 %   04/02/17 1917 ??? 89 ??? ??? 94 %   04/02/17 1522 97.9 ??F (36.6 ??C) 80 20 (!) 153/91 99 %   04/02/17 1226 ??? ??? ??? ??? 100 %   04/02/17 1200 ??? ??? ??? ??? (!) 81 %   04/02/17 1147 ??? ??? ??? ??? 90 %   04/02/17 1138 ??? ??? ??? ??? 90 %   04/02/17 1131 98 ??F (36.7 ??C) 84 20 118/66 90 %       Intake/Output Summary (Last 24 hours) at 04/03/2017 1055  Last data filed at 04/03/2017 1000   Gross per 24 hour   Intake 570 ml   Output 1200 ml   Net -630 ml        PHYSICAL EXAM:  General: WD, WN. Alert, cooperative, no acute distress.   EENT:  EOMI. Anicteric sclerae. MMM  Resp:  Coarse BS, no wheezing or rales.  No accessory muscle use  CV:  Regular  rhythm,?? + edema, improving  GI:  Soft, Non distended, Non tender. ??+Bowel sounds  Neurologic:?? Alert and oriented X 3, normal speech, at baseline  Psych:???? Good insight.??Not anxious nor agitated  Skin:  No rashes.  No jaundice    Reviewed most current lab test results and cultures  YES  Reviewed most current radiology test results   YES  Review and summation of old records today    NO  Reviewed patient's current orders and MAR    YES  PMH/SH reviewed - no change compared to H&P  ________________________________________________________________________  Care Plan discussed with:    Comments   Patient x    Family      RN x    Care Manager     Consultant                        Multidiciplinary team rounds were held today with case manager, nursing, pharmacist and Higher education careers adviser.  Patient's plan of care was discussed; medications were reviewed and discharge planning was addressed.     ________________________________________________________________________  Total NON critical care TIME: 25 Minutes    Total CRITICAL CARE TIME Spent:   Minutes non procedure based      Comments   >50% of visit spent in counseling and coordination of care y    ________________________________________________________________________  Hazeline Junker, MD     Procedures: see electronic medical records for all procedures/Xrays and details which were not copied into this note but were reviewed prior to creation of Plan.      LABS:  I reviewed today's most current labs and imaging studies.  Pertinent labs include:  Recent Labs     04/02/17  0200 04/01/17  0527   WBC 7.1 6.4   HGB  11.1* 11.1*   HCT 34.4* 35.1   PLT 259 259     Recent Labs     04/03/17  0336 04/02/17  0200 04/01/17   0527   NA 137 137 136   K 4.0 4.0 4.0   CL 98 99 98   CO2 32 31 34*   GLU 101* 102* 105*   BUN 33* 31* 29*   CREA 0.95 0.92 0.83   CA 8.6 8.5 8.6   MG 2.2 2.0 2.1   PHOS 5.1* 4.7  --    ALB  --   --  2.6*   TBILI  --   --  0.6   SGOT  --   --  8*   ALT  --   --  10*       Signed: Hazeline Junker, MD

## 2017-04-03 NOTE — Progress Notes (Signed)
PCP TOC appt scheduled with Dr.Domah on 04/10/2017 at 12:15pm. Appt added to AVS. A Fisher, CM Specialist     PCP TOC appt scheduled with [facility] to see PT in 24-48 hours after discharge at 9:00am. Appt added to AVS. Mahlon GammonAntonio Fisher, CM Specialist

## 2017-04-03 NOTE — Progress Notes (Signed)
PCU SHIFT NURSING NOTE    Bedside and Verbal shift change report given to Pecolia AdesWilliam Brooks, RN (oncoming nurse) by Nyra CapesShannon Ward, RN (offgoing nurse). Report included the following information SBAR, Kardex, ED Summary, Procedure Summary, Intake/Output, MAR, Recent Results, Med Rec Status, Cardiac Rhythm NSR, Alarm Parameters  and Quality Measures.    Shift Summary:         Admission Date 03/11/2017   Admission Diagnosis Acute respiratory failure with hypoxia (HCC)   Consults IP CONSULT TO CARDIOLOGY  IP CONSULT TO INTENSIVIST  IP CONSULT TO ORTHOPEDIC SURGERY  IP CONSULT TO ORTHOPEDIC SURGERY        Consults   [x] PT   [x] OT   [] Speech   [x] Case Management      [x]  Palliative      Cardiac Monitoring Order   [x] Yes   [] No     IV drips   [] Yes    Drip:                            Dose:  Drip:                            Dose:  Drip:                            Dose:   [x] No     GI Prophylaxis   [x] Yes   [] No         DVT Prophylaxis   SCDs:             Ted stockings:         [x]  Medication   [] Contraindicated   [] None      Activity Level Activity Level: Up with Assistance     Activity Assistance: Partial (one person)   Purposeful Rounding every 1-2 hour?   [x] Yes   Schmidt Score  Total Score: 3   Bed Alarm (If score 3 or >)   [x] Yes   []  Refused (See signed refusal form in chart)   Braden Score  Braden Score: 16   Braden Score (if score 14 or less)   [x] PMT consult   [] Wound Care consult      [x] Specialty bed   [x]  Nutrition consult          Needs prior to discharge:   Home O2 required:    [x] Yes   [] No    If yes, how much O2 required? 4 liters at home baseline      Other:    Last Bowel Movement: Last Bowel Movement Date: 04/02/17      Influenza Vaccine Received Flu Vaccine for Current Season (usually Sept-March): No    Patient/Guardian Refused (Notify MD): Yes   Pneumonia Vaccine           Diet Active Orders   Diet    DIET CARDIAC Regular      LDAs               Peripheral IV 03/31/17 Right Hand (Active)    Site Assessment Clean, dry, & intact 04/03/2017  3:29 AM   Phlebitis Assessment 0 04/03/2017  3:29 AM   Infiltration Assessment 0 04/03/2017  3:29 AM   Dressing Status Clean, dry, & intact 04/03/2017  3:29 AM   Dressing Type Tape;Transparent 04/03/2017  3:29 AM   Hub Color/Line Status Pink;Flushed 04/03/2017  3:29 AM   Action  Taken Open ports on tubing capped 04/02/2017  3:22 PM   Alcohol Cap Used Yes 04/02/2017  3:22 PM          External Female Catheter 03/12/17 (Active)   Site Assessment Clean, dry, & intact 04/03/2017  3:53 AM   Repositioned Yes 04/02/2017 10:30 PM   Perineal Care Yes 04/02/2017 10:30 PM   Wick Changed Yes 04/02/2017  3:22 PM   Suction Canister/Tubing Changed Yes 04/02/2017  3:22 PM   Urine Output (mL) 450 ml 04/03/2017  3:53 AM                Urinary Catheter      Intake & Output Date 04/02/17 0700 - 04/03/17 0659 04/03/17 0700 - 04/04/17 0659   Shift 0700-1859 1900-0659 24 Hour Total 0700-1859 1900-0659 24 Hour Total   INTAKE   P.O. [phone]        P.O. [phone]      Shift Total(mL/kg) 928(7.6) 240(2) 1168(9.8)      OUTPUT   Urine(mL/kg/hr) 650(0.4) 450(0.3) 1100(0.4)        Urine Occurrence(s) 2 x 1 x 3 x        Urine Output (mL) (External Female Catheter 03/12/17) [phone]      Shift Total(mL/kg) 650(5.3) 450(3.8) 1100(9.2)      NET 278 -210 68      Weight (kg) 121.5 119.3 119.3 119.3 119.3 119.3         Readmission Risk Assessment Tool Score High Risk            36       Total Score        3 Has Seen PCP in Last 6 Months (Yes=3, No=0)    3 Patient Length of Stay (>5 days = 3)    4 IP Visits Last 12 Months (1-3=4, 4=9, >4=11)    9 Pt. Coverage (Medicare=5 , Medicaid, or Self-Pay=4)    17 Charlson Comorbidity Score (Age + Comorbid Conditions)        Criteria that do not apply:    Married. Living with Significant Other. Assisted Living. LTAC. SNF. or   Rehab       Expected Length of Stay 4d 2h   Actual Length of Stay 22

## 2017-04-03 NOTE — Progress Notes (Signed)
Chart opened in error

## 2017-04-03 NOTE — Progress Notes (Signed)
Discussed with Dr. Glee ArvinMencias.   Agree with discharge.   Will be available as needed.

## 2017-04-03 NOTE — Progress Notes (Signed)
The objective of today???s visit was to review the transitional care plan with the patient. We discussed the importance of transitional care as it relates to reducing the likelihood of readmission.  We reviewed the goals for the first 30 days following hospital discharge. The patient and I discussed wrap around services including nurse navigation, care coordination, home health, transitional care appointments with their primary care provider and specialist team and the role of dispatch health. Patient education focused on readmission zones as described as  The Red Zone: High risk for readmission, days 1-21  The Yellow Zone: Moderate risk for readmission, days 22-29  The Green Zone: Lower risk for readmission, days 30 and after    Medication reconciliation was performed. In addition, the patient was instructed on worrisome symptoms for worsening of their medical conditions and sepsis. Learning was assessed using teach back in the form of role playing. The patient identified appropriate wrap around services during this interaction. A written individual care plan was reviewed with the patient as well today.     The patient expressed the following specific concerns regarding their discharge  Concerned about possible need for home services after discharge from [facility] facility. I explained that the interdiscipline team at the facility should be discussing her home safe discharge from her admission there- anticipate that that team will meet early next week. Pt will need monitoring of her electrolytes as she is on spirolactone( may elevate potassium level)  and also a potassium wasting diuretic, and has potassium supplements ordered

## 2017-04-03 NOTE — Progress Notes (Signed)
Problem: Pressure Injury - Risk of  Goal: *Prevention of pressure injury  Document Braden Scale and appropriate interventions in the flowsheet.   Outcome: Progressing Towards Goal  Pressure Injury Interventions:  Sensory Interventions: Pressure redistribution bed/mattress (bed type)    Moisture Interventions: Internal/External urinary devices    Activity Interventions: Pressure redistribution bed/mattress(bed type)    Mobility Interventions: Pressure redistribution bed/mattress (bed type)    Nutrition Interventions: Document food/fluid/supplement intake    Friction and Shear Interventions: Lift sheet               Problem: Falls - Risk of  Goal: *Absence of Falls  Document Schmid Fall Risk and appropriate interventions in the flowsheet.   Outcome: Progressing Towards Goal  Fall Risk Interventions:  Mobility Interventions: Patient to call before getting OOB    Mentation Interventions: Gait belt with transfers/ambulation    Medication Interventions: Patient to call before getting OOB    Elimination Interventions: Call light in reach

## 2017-04-03 NOTE — Discharge Summary (Deleted)
Hospitalist Discharge Summary     Patient ID:  Brittney Shelton  [id number]  66 y.o.  March 04, 1951    PCP on record: Erenest Rasher, MD    Admit date: 03/11/2017  Discharge date and time: 04/03/2017      DISCHARGE DIAGNOSIS:    Respiratory Failure, CHF,  Obesity Hypoventilation Sd,  DJD, DM, HTN, Hypokalemia, Hyperlipidemia, Weakness, Constipation      CONSULTATIONS:  IP CONSULT TO CARDIOLOGY  IP CONSULT TO INTENSIVIST  IP CONSULT TO ORTHOPEDIC SURGERY  IP CONSULT TO ORTHOPEDIC SURGERY    Excerpted HPI from H&P of Shelda Jakes, DO:  Brittney Shelton is a 66 y.o. female just discharged from SNF earlier today after completing physical therapy who states "my back locked up" and I couldn't walk. EMS was summoned and Pt was found to be hypoxic while on her usual 4L NC O2. Patient denies and chest pain, nausea vomiting, diaphoresis. Subjectively does not feel short of breath despite hypoxia. Pt has been placed on ventimask in ED and is currently maintaining her sats. CTA chest from earlier today is negative for PE or pleural effusion. pBNP noted to be 16k.  We were asked to admit for work up and evaluation of the above problems  ______________________________________________________________________  DISCHARGE SUMMARY/HOSPITAL COURSE:  for full details see H&P, daily progress notes, labs, consult notes. Lumbar degenerative disc disese/back pain and right knee pain  XR spine, degenerative changes??L3-S1, marked disc space narrowing at L4-L5, facet arthropathy L3-S1  PT/OT  S/p R knee arthrocentesis 10/21.  Appreciate ortho help with knee injections   PRN norco and tylenol for pain  Difficult to excalate pain meds due to severe COPD  Pain is controlled  Acute on chronic hypoxic respiratory failure POA with baseline 4-5L of O2 on NC today, able to D/c venti mask, c/w supplemental O2. Wean down as tolerated. EF 65% with diastolic dysfunction, doing better, will try to  get down to [facility] with goal to keep O2 sat above 90  Acute on chronic diastolic congestive heart failure in settings of moderate Pulmonary HTN (ProBNP with 40981 which is significantly elevated in morbidly obese pt) c/w Lasix IV, and Spironolactone, Weight is down, monitor. Keeping negative balances, patient keeps improving  Obesity hypoventilation suspected.  No copd per pulmonary.  PFT's done 2016 w/o airway obstruction and only smoked 1-2 years total.  Repeat Echo 10/11, EF 60 % to 65 %. There were no regional wall motion abnormalities, was moderate pulmonary hypertension.  Required high flow O2. Now on 5L O2.  Difficulty weaning O2 to baseline of 4L due to hypoxia.  Patient does not tolerate BIPAP/CPAP due to tight fitting of mask. She has been requiring venimask at night.   Palliative care consulted. Patient decided on DNR.  Pulmonary and cardiology consulted. Consults appreciated.  Pulmonary recommends outpatient PFT's and repeat CT chest.  Outpatient PFTs  Pulmonary nodules  -repeat CT chest in 1 year  DM2 - Diet Controlled  Last A1C on record 11/2016 is 5.8. No meds listed PTA  hyperglycemia likely related to Steroids  Continue SSI for now   HTN  Cont toprol and amlodipine , Allergy to ACE  Hypokalemia  -supplement  HLD  Cont home pravastatin  Morbid obesity - due to excess weight.  Constipation   Cont laxative  Generalized weakness  -cont PT/OT  Code Status:  DNR  Surrogate Decision Maker: Justine Null [phone]  DVT Prophylaxis: sq heparin  GI Prophylaxis: not indicated  Baseline: lives at  home with family    D/c To SNF today and F/U with PCP, Cardiology and Pulmonology  _______________________________________________________________________  Patient seen and examined by me on discharge day.  Pertinent Findings:  Gen:    Not in distress  Chest: Coarse BS, rhonchi  CVS:   Regular rhythm.  + edema  Abd:  Soft, not distended, not tender  Neuro:  Alert, GCS 15   _______________________________________________________________________  DISCHARGE MEDICATIONS:   Current Discharge Medication List      START taking these medications    Details   acetaminophen (TYLENOL) 325 mg tablet Take 2 Tabs by mouth every six (6) hours as needed.  Qty: 40 Tab, Refills: 0      albuterol-ipratropium (DUO-NEB) 2.5 mg-0.5 mg/3 ml nebu 3 mL by Nebulization route every six (6) hours as needed. COPD  J 44.9  Qty: 100 Nebule, Refills: 1      HYDROcodone-acetaminophen (NORCO) 5-325 mg per tablet Take 1 Tab by mouth every six (6) hours as needed. Max Daily Amount: 4 Tabs.  Qty: 30 Tab, Refills: 0    Associated Diagnoses: Lumbar degenerative disc disease; Chronic bilateral low back pain without sciatica      polyethylene glycol (MIRALAX) 17 gram packet Take 1 Packet by mouth daily.  Qty: 30 Each, Refills: 1      potassium chloride SR (KLOR-CON 10) 10 mEq tablet Take 1 Tab by mouth two (2) times a day.  Qty: 60 Tab, Refills: 1      spironolactone (ALDACTONE) 50 mg tablet Take 1 Tab by mouth daily.  Qty: 30 Tab, Refills: 1         CONTINUE these medications which have CHANGED    Details   amLODIPine (NORVASC) 5 mg tablet Take 1 Tab by mouth daily.  Qty: 30 Tab, Refills: 1      furosemide (LASIX) 80 mg tablet Take 1 Tab by mouth two (2) times a day.  Qty: 60 Tab, Refills: 1      metoprolol succinate (TOPROL-XL) 25 mg XL tablet Take 3 Tabs by mouth daily.  Qty: 90 Tab, Refills: 1      methocarbamol (ROBAXIN) 500 mg tablet Take 1 Tab by mouth every eight (8) hours as needed.  Qty: 40 Tab, Refills: 0         CONTINUE these medications which have NOT CHANGED    Details   umeclidinium-vilanterol (ANORO ELLIPTA) 62.5-25 mcg/actuation inhaler Take 1 Puff by inhalation as needed.      pravastatin (PRAVACHOL) 20 mg tablet TAKE 1 TABLET BY MOUTH DAILY AT NIGHT  Qty: 90 Tab, Refills: 0    Associated Diagnoses: Hypercholesterolemia      aspirin delayed-release 81 mg tablet Take 81 mg by mouth daily.       isosorbide dinitrate (ISORDIL) 30 mg tablet TAKE 1 TABLET BY MOUTH THREE TIMES DAILY  Qty: 270 Tab, Refills: 3    Associated Diagnoses: Hypertension, uncontrolled      naproxen sodium (ALEVE) 220 mg tablet Take 440 mg by mouth every twelve (12) hours as needed for Pain.         STOP taking these medications       lisinopril (PRINIVIL, ZESTRIL) 40 mg tablet Comments:   Reason for Stopping:         cloNIDine HCl (CATAPRES) 0.1 mg tablet Comments:   Reason for Stopping:         hydrALAZINE (APRESOLINE) 10 mg tablet Comments:   Reason for Stopping:  My Recommended Diet, Activity, Wound Care, and follow-up labs are listed in the patient's Discharge Insturctions which I have personally completed and reviewed.    ______________________________________________________________________    Risk of deterioration: Moderate    Condition at Discharge:  Stable  ______________________________________________________________________    Disposition  SNF/LTC  ______________________________________________________________________    Care Plan discussed with:   Patient, RN, Care Manager, Consultant    ______________________________________________________________________    Code Status: DNR/DNI  ______________________________________________________________________      Follow up with:   PCP : Erenest Rasheromah, Nau D, MD  Follow-up Information     Follow up With Specialties Details Why Contact Info    Domah, Jackey LogeNau D, MD Internal Medicine Schedule an appointment as soon as possible for a visit in 1 week for PCP post hospital follow up appointment.  [facility]vel[facility] Bucy[address]  [address]  [place] [id number]  [phone]          F/U PCP  F/U Pulmonology  F/U Cardiology      Total time in minutes spent coordinating this discharge (includes going over instructions, follow-up, prescriptions, and preparing report for sign off to her PCP) :  35 minutes    Signed:  Hazeline Junkerarlos J Mencias, MD

## 2017-04-03 NOTE — Progress Notes (Addendum)
Called and spoke with Aaron Edelman from [facility] this morning and informed him of patient's discharge for today. He states ok and to get the name of the accepting md and number for report from [place] with admissions at [facility]. Called Kassi and she states they have met their quota of admissions today and will not be able to accept patient until tomorrow. Informed her they were informed on yesterday patient should be discharging today after md sees patient. She checked and states she was informed they will have to accept patient tomorrow. Patient, staff and Dr Marolyn Haller made aware. This will be an EMTALA when discharged.  Inbasket NN and made her aware of dcp.

## 2017-04-04 LAB — GLUCOSE, POC
Glucose (POC): 156 mg/dL — ABNORMAL HIGH (ref 65–100)
Glucose (POC): 101 mg/dL — ABNORMAL HIGH (ref 65–100)
Glucose (POC): 189 mg/dL — ABNORMAL HIGH (ref 65–100)
Glucose (POC): 142 mg/dL — ABNORMAL HIGH (ref 65–100)

## 2017-04-04 MED ORDER — NEBULIZER & COMPRESSOR
Freq: Every day | 0 refills | Status: DC
Start: 2017-04-04 — End: 2017-10-21

## 2017-04-04 MED ORDER — NEBULIZER ACCESSORIES KIT
PACK | 0 refills | Status: DC
Start: 2017-04-04 — End: 2017-10-21

## 2017-04-04 MED FILL — TOPROL XL 25 MG TABLET,EXTENDED RELEASE: 25 mg | ORAL | Qty: 1

## 2017-04-04 MED FILL — HYDROCODONE-ACETAMINOPHEN 5 MG-325 MG TAB: 5-325 mg | ORAL | Qty: 1

## 2017-04-04 MED FILL — AMLODIPINE 5 MG TAB: 5 mg | ORAL | Qty: 1

## 2017-04-04 MED FILL — METHOCARBAMOL 500 MG TAB: 500 mg | ORAL | Qty: 1

## 2017-04-04 MED FILL — FUROSEMIDE 10 MG/ML IJ SOLN: 10 mg/mL | INTRAMUSCULAR | Qty: 8

## 2017-04-04 MED FILL — ASPIRIN 81 MG TAB, DELAYED RELEASE: 81 mg | ORAL | Qty: 1

## 2017-04-04 MED FILL — LOVENOX 40 MG/0.4 ML SUBCUTANEOUS SYRINGE: 40 mg/0.4 mL | SUBCUTANEOUS | Qty: 0.4

## 2017-04-04 MED FILL — MAPAP (ACETAMINOPHEN) 325 MG TABLET: 325 mg | ORAL | Qty: 2

## 2017-04-04 MED FILL — ANORO ELLIPTA 62.5 MCG-25 MCG/ACTUATION POWDER FOR INHALATION: RESPIRATORY_TRACT | Qty: 14

## 2017-04-04 MED FILL — SPIRONOLACTONE 25 MG TAB: 25 mg | ORAL | Qty: 2

## 2017-04-04 MED FILL — PRAVASTATIN 10 MG TAB: 10 mg | ORAL | Qty: 2

## 2017-04-04 MED FILL — HEALTHYLAX 17 GRAM ORAL POWDER PACKET: 17 gram | ORAL | Qty: 1

## 2017-04-04 MED FILL — POTASSIUM CHLORIDE SR 10 MEQ TAB: 10 mEq | ORAL | Qty: 1

## 2017-04-04 NOTE — Progress Notes (Signed)
Problem: Pressure Injury - Risk of  Goal: *Prevention of pressure injury  Document Braden Scale and appropriate interventions in the flowsheet.   Outcome: Progressing Towards Goal  Pressure Injury Interventions:  Sensory Interventions: Assess changes in LOC, Check visual cues for pain, Maintain/enhance activity level, Pressure redistribution bed/mattress (bed type), Assess need for specialty bed, Discuss PT/OT consult with provider, Minimize linen layers, Sit a 90-degree angle/use footstool if needed, Avoid rigorous massage over bony prominences, Float heels, Monitor skin under medical devices, Chair cushion, Keep linens dry and wrinkle-free, Pad between skin to skin, Turn and reposition approx. every two hours (pillows and wedges if needed)    Moisture Interventions: Absorbent underpads, Moisture barrier, Apply protective barrier, creams and emollients, Internal/External urinary devices, Offer toileting Q_hr, Assess need for specialty bed, Limit adult briefs, Check for incontinence Q2 hours and as needed, Maintain skin hydration (lotion/cream), Contain wound drainage, Minimize layers    Activity Interventions: Assess need for specialty bed, Chair cushion, Increase time out of bed, Pressure redistribution bed/mattress(bed type), PT/OT evaluation    Mobility Interventions: Assess need for specialty bed, Chair cushion, Float heels, HOB 30 degrees or less, PT/OT evaluation, Pressure redistribution bed/mattress (bed type), Turn and reposition approx. every two hours(pillow and wedges)    Nutrition Interventions: Document food/fluid/supplement intake, Discuss nutritional consult with provider, Offer support with meals,snacks and hydration    Friction and Shear Interventions: Apply protective barrier, creams and emollients, HOB 30 degrees or less, Minimize layers, Transferring/repositioning devices, Feet elevated on foot rest, Lift sheet, Sit at 90-degree angle, Foam dressings/transparent film/skin  sealants, Lift team/patient mobility team               Problem: Falls - Risk of  Goal: *Absence of Falls  Document Schmid Fall Risk and appropriate interventions in the flowsheet.   Outcome: Progressing Towards Goal  Fall Risk Interventions:  Mobility Interventions: Assess mobility with egress test, Bed/chair exit alarm, PT Consult for assist device competence, PT Consult for mobility concerns, Utilize gait belt for transfers/ambulation, Utilize walker, cane, or other assistive device, Patient to call before getting OOB, Strengthening exercises (ROM-active/passive), OT consult for ADLs    Mentation Interventions: Bed/chair exit alarm, Adequate sleep, hydration, pain control, Familiar objects from home, More frequent rounding, Update white board, Reorient patient, Door open when patient unattended, Gait belt with transfers/ambulation, Room close to nurse's station, Evaluate medications/consider consulting pharmacy, Increase mobility, Toileting rounds    Medication Interventions: Bed/chair exit alarm, Evaluate medications/consider consulting pharmacy, Utilize gait belt for transfers/ambulation, Teach patient to arise slowly, Patient to call before getting OOB    Elimination Interventions: Bed/chair exit alarm, Call light in reach, Elevated toilet seat, Patient to call for help with toileting needs, Toilet paper/wipes in reach, Toileting schedule/hourly rounds

## 2017-04-04 NOTE — Progress Notes (Addendum)
Discharge orders acknowledged. CM contacted Rodena Piety ([facility], [phone]) to confirm bed availability for today. CM informed Admissions will contact MD to confirm and follow-up with CM as soon as possible.     9:30AM UPDATE  Rodena Piety ([facility]) called back CM to confirm able to accept pt for admission today. MD and Nursing to complete EMTALA for discharge. Nursing to request [facility] transport for EMTALA transfer. CM to assist as needed.     12:00PM UPDATE  [facility] transport setup for 3:00PM by nursing. CM called to confirm with Rodena Piety ([facility]). Pt has no additional CM needs at this time.     Accepting MD: Dr. Dorathy Daft  Call Report: [phone]  *Room number to be provided when nursing calls report    Care Management Interventions  PCP Verified by CM: Yes  Palliative Care Criteria Met (RRAT>21 & CHF Dx)?: No  Mode of Transport at Discharge: [facility])  Hospital Transport Time of Discharge: 1500  Transition of Care Consult (CM Consult): Other([facility])  MyChart Signup: No  Discharge Durable Medical Equipment: No  Health Maintenance Reviewed: Yes  Physical Therapy Consult: Yes  Occupational Therapy Consult: Yes  Speech Therapy Consult: No  Current Support Network: Lives Alone, Own Home  Confirm Follow Up Transport: Family  Plan discussed with Pt/Family/Caregiver: Yes  Freedom of Choice Offered: Yes  Discharge Location  Discharge Placement: Rehab hospital/unit acute([facility])    Yehuda Savannah, MSW Supervisee in Social Work, [place]  [phone]

## 2017-04-04 NOTE — Discharge Summary (Signed)
Hospitalist Discharge Summary     Patient ID:  Brittney Shelton  [id number]  66 y.o.  1951/04/05    PCP on record: Ermalene Postin, MD    Admit date: 03/11/2017  Discharge date and time: 04/04/2017      DISCHARGE DIAGNOSIS:    Respiratory Failure, CHF,  Obesity Hypoventilation Sd,  DJD, DM, HTN, Hypokalemia, Hyperlipidemia, Weakness, Constipation      CONSULTATIONS:  IP CONSULT TO CARDIOLOGY  IP CONSULT TO INTENSIVIST  IP CONSULT TO ORTHOPEDIC SURGERY  IP CONSULT TO ORTHOPEDIC SURGERY    Excerpted HPI from H&P of Trula Slade, DO:  Brittney Shelton is a 66 y.o. female just discharged from SNF earlier today after completing physical therapy who states "my back locked up" and I couldn't walk. EMS was summoned and Pt was found to be hypoxic while on her usual 4L NC O2. Patient denies and chest pain, nausea vomiting, diaphoresis. Subjectively does not feel short of breath despite hypoxia. Pt has been placed on ventimask in ED and is currently maintaining her sats. CTA chest from earlier today is negative for PE or pleural effusion. pBNP noted to be 16k.  We were asked to admit for work up and evaluation of the above problems  ______________________________________________________________________  DISCHARGE SUMMARY/HOSPITAL COURSE:  for full details see H&P, daily progress notes, labs, consult notes. Lumbar degenerative disc disese/back pain and right knee pain  XR spine, degenerative changes??L3-S1, marked disc space narrowing at L4-L5, facet arthropathy L3-S1  PT/OT  S/p R knee arthrocentesis 10/21.  Appreciate ortho help with knee injections   PRN norco and tylenol for pain  Difficult to excalate pain meds due to severe COPD  Pain is controlled  Acute on chronic hypoxic respiratory failure POA with baseline 4-5L of O2 on NC today, able to D/c venti mask, c/w supplemental O2. Wean down as tolerated. EF 76% with diastolic dysfunction, doing better, will try to  get down to [facility] with goal to keep O2 sat above 90  Acute on chronic diastolic congestive heart failure in settings of moderate Pulmonary HTN (ProBNP with 5160729396 which is significantly elevated in morbidly obese pt) c/w Lasix IV, and Spironolactone, Weight is down, monitor. Keeping negative balances, patient keeps improving  Obesity hypoventilation Sd.  No copd per pulmonary.  PFT's done 2016 w/o airway obstruction and only smoked 1-2 years total.  Repeat Echo 10/11, EF 60 % to 65 %. There were no regional wall motion abnormalities, was moderate pulmonary hypertension.  Required high flow O2. Now on 5L O2.  Difficulty weaning O2 to baseline of 4L due to hypoxia.  Patient does not tolerate BIPAP/CPAP due to tight fitting of mask. She has been requiring venimask at night.   Palliative care consulted. Patient decided on DNR.  Pulmonary and cardiology consulted. Consults appreciated.  Pulmonary recommends outpatient PFT's and repeat CT chest.  Outpatient PFTs  Pulmonary nodules  -repeat CT chest in 1 year  DM2 - Diet Controlled  Last A1C on record 11/2016 is 5.8. No meds listed PTA  hyperglycemia likely related to Steroids  Continue SSI for now   HTN  Cont toprol and amlodipine , Allergy to ACE  Hypokalemia  -supplement  HLD  Cont home pravastatin  Morbid obesity - due to excess weight.  Constipation   Cont laxative  Generalized weakness  -cont PT/OT  Code Status:  DNR  Surrogate Decision Maker: Barbie Haggis [phone]  DVT Prophylaxis: sq heparin  GI Prophylaxis: not indicated  Baseline: lives at  home with family    D/c To SNF today and F/U with PCP, Cardiology and Pulmonology  _______________________________________________________________________  Patient seen and examined by me on discharge day.  Pertinent Findings:  Gen:    Not in distress  Chest: Coarse BS, rhonchi  CVS:   Regular rhythm.  + edema  Abd:  Soft, not distended, not tender  Neuro:  Alert, GCS 15   _______________________________________________________________________  DISCHARGE MEDICATIONS:   Current Discharge Medication List      START taking these medications    Details   Nebulizer & Compressor machine 1 Each by Does Not Apply route daily. Obesity hypoventilation E66.2  Qty: 1 Each, Refills: 0      Nebulizer Accessories kit Obesity Hypoventilation E66.2  Qty: 1 Kit, Refills: 0      acetaminophen (TYLENOL) 325 mg tablet Take 2 Tabs by mouth every six (6) hours as needed.  Qty: 40 Tab, Refills: 0      albuterol-ipratropium (DUO-NEB) 2.5 mg-0.5 mg/3 ml nebu 3 mL by Nebulization route every six (6) hours as needed. COPD  J 44.9  Qty: 100 Nebule, Refills: 1      HYDROcodone-acetaminophen (NORCO) 5-325 mg per tablet Take 1 Tab by mouth every six (6) hours as needed. Max Daily Amount: 4 Tabs.  Qty: 30 Tab, Refills: 0    Associated Diagnoses: Lumbar degenerative disc disease; Chronic bilateral low back pain without sciatica      polyethylene glycol (MIRALAX) 17 gram packet Take 1 Packet by mouth daily.  Qty: 30 Each, Refills: 1      potassium chloride SR (KLOR-CON 10) 10 mEq tablet Take 1 Tab by mouth two (2) times a day.  Qty: 60 Tab, Refills: 1      spironolactone (ALDACTONE) 50 mg tablet Take 1 Tab by mouth daily.  Qty: 30 Tab, Refills: 1         CONTINUE these medications which have CHANGED    Details   amLODIPine (NORVASC) 5 mg tablet Take 1 Tab by mouth daily.  Qty: 30 Tab, Refills: 1      furosemide (LASIX) 80 mg tablet Take 1 Tab by mouth two (2) times a day.  Qty: 60 Tab, Refills: 1      metoprolol succinate (TOPROL-XL) 25 mg XL tablet Take 3 Tabs by mouth daily.  Qty: 90 Tab, Refills: 1      methocarbamol (ROBAXIN) 500 mg tablet Take 1 Tab by mouth every eight (8) hours as needed.  Qty: 40 Tab, Refills: 0         CONTINUE these medications which have NOT CHANGED    Details   umeclidinium-vilanterol (ANORO ELLIPTA) 62.5-25 mcg/actuation inhaler Take 1 Puff by inhalation as needed.       pravastatin (PRAVACHOL) 20 mg tablet TAKE 1 TABLET BY MOUTH DAILY AT NIGHT  Qty: 90 Tab, Refills: 0    Associated Diagnoses: Hypercholesterolemia      aspirin delayed-release 81 mg tablet Take 81 mg by mouth daily.      isosorbide dinitrate (ISORDIL) 30 mg tablet TAKE 1 TABLET BY MOUTH THREE TIMES DAILY  Qty: 270 Tab, Refills: 3    Associated Diagnoses: Hypertension, uncontrolled      naproxen sodium (ALEVE) 220 mg tablet Take 440 mg by mouth every twelve (12) hours as needed for Pain.         STOP taking these medications       lisinopril (PRINIVIL, ZESTRIL) 40 mg tablet Comments:   Reason for Stopping:  cloNIDine HCl (CATAPRES) 0.1 mg tablet Comments:   Reason for Stopping:         hydrALAZINE (APRESOLINE) 10 mg tablet Comments:   Reason for Stopping:               My Recommended Diet, Activity, Wound Care, and follow-up labs are listed in the patient's Discharge Insturctions which I have personally completed and reviewed.    ______________________________________________________________________    Risk of deterioration: Moderate    Condition at Discharge:  Stable  ______________________________________________________________________    Disposition  SNF/LTC  ______________________________________________________________________    Care Plan discussed with:   Patient, RN, Care Manager, Consultant    ______________________________________________________________________    Code Status: DNR/DNI  ______________________________________________________________________      Follow up with:   PCP : Ermalene Postin, MD  Follow-up Information     Follow up With Specialties Details Why Contact Info    Ermalene Postin, MD Internal Medicine Go on 04/09/2017 Hospital follow-up scheduled at 12:15pm ( If you have questions or need to reschedule please call [facility]  [place]  [place] [address]  [place]  On 04/05/2017 Expect a phone call from [place] to  schedule a follow up visit with you in 24-48 hours. If you have questions please Contact [phone] Visit at [place] and  [place] have teamed up to make care upon discharge more convenient. A team of doctors, nurse practitioners and EMTs, that are equipped with all the tools necessary to provide advanced medical care in the comfort of your home.          F/U PCP  F/U Pulmonology  F/U Cardiology      Total time in minutes spent coordinating this discharge (includes going over instructions, follow-up, prescriptions, and preparing report for sign off to her PCP) :  35 minutes    Signed:  Amelia Jo, MD

## 2017-04-04 NOTE — Progress Notes (Signed)
PCU SHIFT NURSING NOTE    Bedside and Verbal shift change report given to Pecolia AdesWilliam Brooks, RN (oncoming nurse) by Daine Gravelasey Hughes, RN (offgoing nurse). Report included the following information SBAR, Kardex, ED Summary, Procedure Summary, Intake/Output, MAR, Recent Results, Med Rec Status, Cardiac Rhythm NSR, Alarm Parameters  and Quality Measures.    Shift Summary:         Admission Date 03/11/2017   Admission Diagnosis Acute respiratory failure with hypoxia (HCC)   Consults IP CONSULT TO CARDIOLOGY  IP CONSULT TO INTENSIVIST  IP CONSULT TO ORTHOPEDIC SURGERY  IP CONSULT TO ORTHOPEDIC SURGERY        Consults   [x] PT   [x] OT   [x] Speech   [x] Case Management      [x]  Palliative      Cardiac Monitoring Order   [x] Yes   [] No     IV drips   [] Yes    Drip:                            Dose:  Drip:                            Dose:  Drip:                            Dose:   [x] No     GI Prophylaxis   [x] Yes   [] No         DVT Prophylaxis   SCDs:             Ted stockings:         [x]  Medication   [] Contraindicated   [] None      Activity Level Activity Level: Up with Assistance     Activity Assistance: Partial (one person)   Purposeful Rounding every 1-2 hour?   [x] Yes   Schmidt Score  Total Score: 3   Bed Alarm (If score 3 or >)   [x] Yes   []  Refused (See signed refusal form in chart)   Braden Score  Braden Score: 18   Braden Score (if score 14 or less)   [x] PMT consult   [x] Wound Care consult      [x] Specialty bed   [x]  Nutrition consult          Needs prior to discharge:   Home O2 required:    [x] Yes   [] No    If yes, how much O2 required? 4 liters baseline at home    Other:    Last Bowel Movement: Last Bowel Movement Date: 04/03/17      Influenza Vaccine Received Flu Vaccine for Current Season (usually Sept-March): No    Patient/Guardian Refused (Notify MD): Yes   Pneumonia Vaccine           Diet Active Orders   Diet    DIET CARDIAC Regular      LDAs               Peripheral IV 03/31/17 Right Hand (Active)    Site Assessment Clean, dry, & intact 04/04/2017  7:18 AM   Phlebitis Assessment 0 04/04/2017  7:18 AM   Infiltration Assessment 0 04/04/2017  7:18 AM   Dressing Status Clean, dry, & intact 04/04/2017  7:18 AM   Dressing Type Tape;Transparent 04/04/2017  7:18 AM   Hub Color/Line Status Blue;Capped;Flushed 04/04/2017  7:18 AM   Action Taken Open  ports on tubing capped 04/04/2017  7:18 AM   Alcohol Cap Used Yes 04/04/2017  7:18 AM          External Female Catheter 03/12/17 (Active)   Site Assessment Clean, dry, & intact 04/04/2017  7:18 AM   Repositioned Yes 04/04/2017  7:18 AM   Perineal Care Yes 04/04/2017  7:18 AM   Wick Changed Yes 04/04/2017  7:18 AM   Suction Canister/Tubing Changed Yes 04/04/2017  7:18 AM   Urine Output (mL) 300 ml 04/04/2017  7:18 AM                Urinary Catheter      Intake & Output Date 04/03/17 0700 - 04/04/17 0659 04/04/17 0700 - 04/05/17 0659   Shift 0700-1859 1900-0659 24 Hour Total 0700-1859 1900-0659 24 Hour Total   INTAKE   Shift Total(mL/kg)         OUTPUT   Urine(mL/kg/hr) 1750(1.2) 1000(0.7) 2750(1) 300  300     Urine Occurrence(s)  1 x 1 x        Urine Output (mL) (External Female Catheter 03/12/17) 1750 1000 2750 300  300   Stool           Stool Occurrence(s) 1 x  1 x      Shift Total(mL/kg) 1750(14.6) 1000(8.4) 2750(23) 300(2.5)  300(2.5)   NET -1750 -1000 -2750 -300  -300   Weight (kg) 119.5 119.4 119.4 119.4 119.4 119.4         Readmission Risk Assessment Tool Score High Risk            36       Total Score        3 Has Seen PCP in Last 6 Months (Yes=3, No=0)    3 Patient Length of Stay (>5 days = 3)    4 IP Visits Last 12 Months (1-3=4, 4=9, >4=11)    9 Pt. Coverage (Medicare=5 , Medicaid, or Self-Pay=4)    17 Charlson Comorbidity Score (Age + Comorbid Conditions)        Criteria that do not apply:    Married. Living with Significant Other. Assisted Living. LTAC. SNF. or   Rehab       Expected Length of Stay 4d 2h   Actual Length of Stay 23

## 2017-04-04 NOTE — Progress Notes (Signed)
Hospital to SNF SBAR Handoff - Brittney Shelton                                                                        66 y.o.   female    [facility]   Room: 2241/01    MRM 2 PROGRESSIVE CARE  Unit Phone# :  [phone]      [facility]  MRM 2 PROGRESSIVE CARE  [address]  [place] [address]  Dept: [phone]  Loc: [phone]                    SITUATION     Admitted:  03/11/2017         Attending Provider:  Hazeline Junker, MD       Consultations:  IP CONSULT TO CARDIOLOGY  IP CONSULT TO INTENSIVIST  IP CONSULT TO ORTHOPEDIC SURGERY  IP CONSULT TO ORTHOPEDIC SURGERY    PCP:  Erenest Rasher, MD   [phone]    Treatment Team: Attending Provider: Hazeline Junker, MD; Consulting Provider: Shelda Jakes, DO; Utilization Review: Ames Coupe, RN; Care Manager: Leone Brand; Care Manager: Darrell Jewel, RN    Admitting Dx:  Acute respiratory failure with hypoxia [facility])       Principal Problem: <principal problem not specified>    * No surgery found * of      BY: * Surgery not found *             ON: * No surgery found *                  Code Status: DNR                Advance Directives:   Advance Care Planning 03/13/2017   Patient's Healthcare Decision Maker is: Named in scanned ACP document   Primary Decision Maker Name Gerri Lins   Primary Decision Maker Phone Number [phone]   Primary Decision Maker Relationship to Patient Other relative   Secondary Decision Maker Name Molinda Bailiff and Candice Camp    Secondary Decision Maker Phone Number [phone]   Secondary Decision Maker Relationship to Patient -   Confirm Advance Directive Yes, on file   Patient Would Like to Complete Advance Directive -    (Send w/patient)   Verified       Isolation:  There are currently no Active Isolations       MDRO: No current active infections    Pain Medications given:  Norco    Last dose: 03/04/2017 at  0745     Special Equipment needed: no  Type of equipment: none  (Not currently on dialysis)  (Not currently on dialysis)  (Not currently on dialysis)     BACKGROUND   Allergies:  Allergies   Allergen Reactions   ??? Lisinopril Angioedema       Past Medical History:   Diagnosis Date   ??? Chronic obstructive pulmonary disease (HCC)    ??? Congestive heart failure (HCC)    ??? Diabetes (HCC)    ??? Hypertension        History reviewed. No pertinent surgical history.    Medications Prior to  Admission   Medication Sig   ??? umeclidinium-vilanterol (ANORO ELLIPTA) 62.5-25 mcg/actuation inhaler Take 1 Puff by inhalation as needed.   ??? lisinopril (PRINIVIL, ZESTRIL) 40 mg tablet Take 40 mg by mouth daily.   ??? metoprolol succinate (TOPROL-XL) 200 mg XL tablet TAKE 1 TABLET BY MOUTH ONCE DAILY FOR HIGH BLOOD PRESSURE   ??? pravastatin (PRAVACHOL) 20 mg tablet TAKE 1 TABLET BY MOUTH DAILY AT NIGHT   ??? amLODIPine (NORVASC) 10 mg tablet TAKE 1 TABLET BY MOUTH DAILY   ??? aspirin delayed-release 81 mg tablet Take 81 mg by mouth daily.   ??? isosorbide dinitrate (ISORDIL) 30 mg tablet TAKE 1 TABLET BY MOUTH THREE TIMES DAILY   ??? cloNIDine HCl (CATAPRES) 0.1 mg tablet TAKE 1 TABLET BY MOUTH ONCE DAILY   ??? [DISCONTINUED] furosemide (LASIX) 40 mg tablet Take 40 mg by mouth three (3) times daily.   ??? hydrALAZINE (APRESOLINE) 10 mg tablet Take 1 Tab by mouth three (3) times daily.   ??? naproxen sodium (ALEVE) 220 mg tablet Take 440 mg by mouth every twelve (12) hours as needed for Pain.       Hard scripts included in transfer packet: yes    Vaccinations:    Immunization History   Administered Date(s) Administered   ??? Influenza High Dose Vaccine PF 03/13/2016       Readmission Risks:    Known Risks: Oxygen saturations        The Charlson CoMorbitiy Index tool is an evidenced based tool that has more automatic generated information. The tool looks at many different items such as the age of the patient, how many times they were admitted in  the last calendar year, current length of stay in the hospital and their diagnosis. All of these items are pulled automatically from information documented in the chart from various places and will generate a score that predicts whether a patient is at low (less than 13), medium (13-20) or high (21 or greater) risk of being readmitted.        ASSESSMENT                Temp: 97.5 ??F (36.4 ??C) (04/04/17 1059) Pulse (Heart Rate): 80 (04/04/17 1059)     Resp Rate: 18 (04/04/17 1059)           BP: 113/72 (04/04/17 1059)     O2 Sat (%): 95 % (04/04/17 1059)     Weight: 119.4 kg (263 lb 4.8 oz)    Height: 5\' 6"  (167.6 cm) (04/04/17 16100648)       If above not within 1 hour of discharge:    BP:__140/77___  P:_97.8___  R:__18__ T:_97.8____ O2 Sat: _4  __%  O2: ______    Active Orders   Diet    DIET CARDIAC Regular         Orientation: oriented to time, place, person and situation     Active Behaviors: None                                   Active Lines/Drains:  (Peg Tube / Foley / CL or S/L?): No    Urinary Status: Voiding, External catheter     Last BM: Last Bowel Movement Date: 04/03/17     Skin Integrity: Intact             Mobility: Slightly limited   Weight Bearing Status: WBAT (Weight Bearing as Tolerated)  Gait Training  Assistive Device: Walker, rolling, Gait belt  Ambulation - Level of Assistance: Contact guard assistance, Assist x2  Distance (ft): 25 Feet (ft)         Lab Results   Component Value Date/Time    Glucose 101 (H) 04/03/2017 03:36 AM    Hemoglobin A1c 6.4 (H) 03/25/2017 09:23 AM    INR 1.1 03/11/2017 08:00 PM    INR 1.1 12/30/2015 02:03 PM    HGB 11.1 (L) 04/02/2017 02:00 AM    HGB 11.1 (L) 04/01/2017 05:27 AM        RECOMMENDATION     See After Visit Summary (AVS) for:  ?? Discharge instructions  ?? After Hospital Care Plan   ?? Special equipment needed (entered pre-discharge by Care Management)  ?? Medication Reconciliation    ?? Follow up Appointment(s)          Report given/sent by:  Jennye Moccasin, RN                    Verbal report given to: Lujean Rave  FAXED to:  none         Estimated discharge time:  04/04/2017 at 1500

## 2017-04-06 NOTE — Progress Notes (Signed)
Transition of Care Coordination/Hospital to Post Acute Facility:     Date/Time:  04/06/2017 10:07 AM    [facility] and discharged on 04/04/17 for Lumbar DDD, CHF.  Patient was transferred to [facility]  for continuation of care.    Inpatient RRAT score: 36    Top Challenges reviewed    Unsure if weighting daily stated doctor would need to order  Patient has been hospital or rehab since 11/2016         Method of communication with care team :none     Nurse Navigator(NN) spoke with Marchelle FolksAmanda, case manager, to provide introduction to self and explanation of the Nurse Navigator Role. Verified name and DOB as patient identifiers.     Discussed and reviewed  anticipated length of stay, anticipated needs at time of discharge, daily weights, follow up appointments, medication reconciliation    ACP:   Does the patient have a current ACP (including DDNR):  yes  Does the post acute facility have a copy of the patients ACP:  Unknown    Medication(s): Completed med rec with Janalyn ShyByrce, pharmacist  New Medications at Discharge: Nebulizer, duo neb, miralax, spirolactone, Tylenol, Norco, Klor-Con  Changed Medications at Discharge: Norvasc, Lasix, metoprolol, robaxin  Discontinued Medications at Discharge: lisinopril, clonidine, hydralazine     PCP/Specialist follow up:   Future Appointments   Date Time Provider Department Center   04/13/2017 11:00 AM Domah, Jackey LogeNau D, MD [facility]        Opportunity to ask questions was provided. Contact information was provided for future reference or further questions. Will continue to monitor.

## 2017-04-09 ENCOUNTER — Encounter: Attending: Internal Medicine | Primary: Internal Medicine

## 2017-04-13 ENCOUNTER — Encounter: Attending: Internal Medicine | Primary: Internal Medicine

## 2017-04-17 NOTE — Progress Notes (Signed)
Spoke with patient after verifying name and DOB. Patient is getting discharged at this time. She will call this Clinical research associatewriter on Monday. [facility] weekend.     Spoke with Vana at [facility]. Provided all requested information and patient is scheduled to be seen this weekend.     Message sent to Delene LollPam R, [facility] to inform of patient discharge and need to see tomorrow.     [address] [facility] unable to start patient Saturday. Can see her Sunday. [facility] contacted. Spoke with [employer] MillsMelissa

## 2017-04-20 ENCOUNTER — Encounter: Primary: Internal Medicine

## 2017-04-21 ENCOUNTER — Encounter: Admit: 2017-04-21 | Discharge: 2017-04-21 | Payer: MEDICARE | Primary: Internal Medicine

## 2017-04-21 NOTE — Telephone Encounter (Signed)
Delana w/[facility]     They saw pt today for start of care   Need medication clarification       Best number to reach her is [phone]

## 2017-04-21 NOTE — Telephone Encounter (Signed)
Nicky PughGave Delana, Judeth CornfieldStephanie NN number [phone] . In chart stephanie has made contact with patient several times. Delana needs to know what medications the patient is suppose to be taking.

## 2017-04-22 ENCOUNTER — Encounter: Admit: 2017-04-22 | Discharge: 2017-04-22 | Payer: MEDICARE | Primary: Internal Medicine

## 2017-04-23 ENCOUNTER — Encounter: Admit: 2017-04-23 | Discharge: 2017-04-23 | Payer: MEDICARE | Primary: Internal Medicine

## 2017-04-27 ENCOUNTER — Encounter: Attending: Internal Medicine | Primary: Internal Medicine

## 2017-04-27 ENCOUNTER — Encounter: Admit: 2017-04-27 | Discharge: 2017-04-27 | Payer: MEDICARE | Primary: Internal Medicine

## 2017-04-28 ENCOUNTER — Encounter: Admit: 2017-04-28 | Discharge: 2017-04-28 | Payer: MEDICARE | Primary: Internal Medicine

## 2017-04-29 ENCOUNTER — Encounter: Admit: 2017-04-29 | Discharge: 2017-04-29 | Payer: MEDICARE | Primary: Internal Medicine

## 2017-04-30 ENCOUNTER — Ambulatory Visit: Admit: 2017-04-30 | Discharge: 2017-04-30 | Payer: MEDICARE | Attending: Internal Medicine | Primary: Internal Medicine

## 2017-04-30 ENCOUNTER — Encounter: Primary: Internal Medicine

## 2017-04-30 DIAGNOSIS — Z9981 Dependence on supplemental oxygen: Secondary | ICD-10-CM

## 2017-04-30 NOTE — Progress Notes (Signed)
Chief Complaint   Patient presents with   ??? Hospital Follow Up     [facility] 03/11/17-04/04/17 for Respiratory failure     HPI:  Brittney Shelton is a 66 y.o. AA female with h/o Diabetes (A!C today=6.4%), hypertension, copd on home oxygen(4L), hypercholesterolemia, obesity presents for hospital follow up.  Patient was admitted to [facility] 03/11/17-04/04/17 for respiratory failure, CHF, obesity with hypoventilation, DM, HTN, hyperlipidemia.  Patient has been doing good, blood pressure is at goal on current medication. Admission record has been reviewed.    Discussed the patient's BMI with her.    BMI follow up plan is as follows: dietary management education, guidance, and counseling, encourage exercise, monitor weight, prescribed dietary intake    This is a Subsequent Medicare Annual Wellness Exam (AWV) (Performed 12 months after IPPE or effective date of Medicare Part B enrollment)  I have reviewed the patient's medical history in detail and updated the computerized patient record.     History     Past Medical History:   Diagnosis Date   ??? Chronic obstructive pulmonary disease ([place])    ??? Congestive heart failure ([place])    ??? Diabetes ([place])    ??? Hypertension       History reviewed. No pertinent surgical history.  Current Outpatient Medications   Medication Sig Dispense Refill   ??? diclofenac (VOLTAREN) 1 % gel Apply 4 g to affected area three (3) times daily.     ??? Nebulizer & Compressor machine 1 Each by Does Not Apply route daily. Obesity hypoventilation E66.2 1 Each 0   ??? Nebulizer Accessories kit Obesity Hypoventilation E66.2 1 Kit 0   ??? amLODIPine (NORVASC) 5 mg tablet Take 1 Tab by mouth daily. 30 Tab 1   ??? furosemide (LASIX) 80 mg tablet Take 1 Tab by mouth two (2) times a day. 60 Tab 1   ??? metoprolol succinate (TOPROL-XL) 25 mg XL tablet Take 3 Tabs by mouth daily. 90 Tab 1   ??? acetaminophen (TYLENOL) 325 mg tablet Take 2 Tabs by mouth every six (6) hours as needed. (Patient taking differently: Take 2 Tabs by mouth every  six (6) hours as needed for Pain.) 40 Tab 0   ??? albuterol-ipratropium (DUO-NEB) 2.5 mg-0.5 mg/3 ml nebu 3 mL by Nebulization route every six (6) hours as needed. COPD  J 44.9 100 Nebule 1   ??? HYDROcodone-acetaminophen (NORCO) 5-325 mg per tablet Take 1 Tab by mouth every six (6) hours as needed. Max Daily Amount: 4 Tabs. (Patient taking differently: Take 1 Tab by mouth every six (6) hours as needed for Pain.) 30 Tab 0   ??? potassium chloride SR (KLOR-CON 10) 10 mEq tablet Take 1 Tab by mouth two (2) times a day. 60 Tab 1   ??? spironolactone (ALDACTONE) 50 mg tablet Take 1 Tab by mouth daily. 30 Tab 1   ??? methocarbamol (ROBAXIN) 500 mg tablet Take 1 Tab by mouth every eight (8) hours as needed. 40 Tab 0   ??? pravastatin (PRAVACHOL) 20 mg tablet TAKE 1 TABLET BY MOUTH DAILY AT NIGHT 90 Tab 0   ??? aspirin delayed-release 81 mg tablet Take 81 mg by mouth daily.     ??? cloNIDine HCl (CATAPRES) 0.1 mg tablet      ??? isosorbide dinitrate (ISORDIL) 30 mg tablet TAKE 1 TABLET BY MOUTH THREE TIMES DAILY 270 Tab 3     Allergies   Allergen Reactions   ??? Lisinopril Angioedema     Family History  Problem Relation Age of Onset   ??? Diabetes Mother    ??? Arthritis-osteo Father    ??? COPD Sister    ??? Diabetes Brother    ??? Diabetes Brother      Social History     Tobacco Use   ??? Smoking status: Never Smoker   ??? Smokeless tobacco: Never Used   Substance Use Topics   ??? Alcohol use: No     Alcohol/week: 0.0 oz     Patient Active Problem List   Diagnosis Code   ??? Well controlled type 2 diabetes mellitus ([place]) E11.9   ??? Hypercholesterolemia E78.00   ??? 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 COPD (chronic obstructive pulmonary disease) (HCC) J44.9   ??? Obesity E66.9   ??? Requires continuous at home supplemental oxygen Z99.81   ??? Dependence on supplemental oxygen Z99.81   ??? ACP (advance care planning) Z71.89   ??? Counseling regarding advanced care planning and goals of care Z71.89    ??? Dyspnea R06.00   ??? Acute on chronic diastolic (congestive) heart failure (HCC) I50.33   ??? Obesity, morbid (HCC) E66.01   ??? 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 Intractable back pain M54.9   ??? Ambulatory dysfunction R26.2   ??? 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 Angioedema T78.3XXA   ??? Acute respiratory failure with hypoxia (HCC) J96.01       Depression Risk Factor Screening:     PHQ over the last two weeks 04/30/2017   Little interest or pleasure in doing things Not at all   Feeling down, depressed, irritable, or hopeless Not at all   Total Score PHQ 2 0     Alcohol Risk Factor Screening:   You do not drink alcohol or very rarely.    Functional Ability and Level of Safety:   Hearing Loss  Hearing is good.    Activities of Daily Living  The home contains: handrails and grab bars  Patient needs help with:  transportation, preparing meals, housework and walking    Fall Risk  Fall Risk Assessment, last 12 mths 04/30/2017   Able to walk? Yes   Fall in past 12 months? No   Fall with injury? -   Number of falls in past 12 months -   Fall Risk Score -       Abuse Screen  Patient is not abused    Cognitive Screening   Evaluation of Cognitive Function:  Has your family/caregiver stated any concerns about your memory: no  Normal    Patient Care Team   Patient Care Team:  Ermalene Postin, MD as PCP - General (Internal Medicine)  Frazier Butt, MD (Pulmonary Disease)  Dianne Dun, MD as Physician (Cardiology)  Binnie Rail, MD (Ophthalmology)  Carmelia Roller, RN as [place] (Cardiology)  Faythe Ghee as Care Manager  May, Tammie W, RN as Transitional Nurse Navigator  Annamarie Major, RN as Ambulatory Care Navigator [facility])    Assessment/Plan   Education and counseling provided:  Screening Mammography  Screening for glaucoma  Diabetes outpatient self-management training services  Diagnoses and all orders for this visit:     Requires continuous at home supplemental oxygen    Medicare annual wellness visit, subsequent  -     CBC WITH AUTOMATED DIFF  -     METABOLIC PANEL, COMPREHENSIVE  Hypercholesterolemia  -     LIPID PANEL  -     pravastatin (PRAVACHOL) 20 mg tablet; Take 1 Tab by mouth nightly., Normal, Disp-90 Tab, R-1    Obesity, morbid (HCC)    Chronic acquired lymphedema    Frequency of urination  -     URINALYSIS W/ RFLX MICROSCOPIC    Hypertension, well controlled    Mixed simple and mucopurulent chronic bronchitis (HCC)    Hypovitaminosis D  -     VITAMIN D, 25 HYDROXY    Diabetic eye exam ([place])    Screening for malignant neoplasm of breast  -     MAM MAMMO BI SCREENING INCL CAD; Future    Well controlled type 2 diabetes mellitus ([place])  -     HEMOGLOBIN A1C WITH EAG  -     METABOLIC PANEL, COMPREHENSIVE  -     MICROALBUMIN, UR, RAND W/ MICROALB/CREAT RATIO    Lumbar degenerative disc disease  -     Discontinue: HYDROcodone-acetaminophen (NORCO) 5-325 mg per tablet; Take 1 Tab by mouth every six (6) hours as needed. Max Daily Amount: 4 Tabs., Print, Disp-60 Tab, R-0  -     HYDROcodone-acetaminophen (NORCO) 5-325 mg per tablet; Take 1 Tab by mouth every six (6) hours as needed. Max Daily Amount: 4 Tabs., Print, Disp-30 Tab, R-0    Chronic bilateral low back pain without sciatica  -     Discontinue: HYDROcodone-acetaminophen (NORCO) 5-325 mg per tablet; Take 1 Tab by mouth every six (6) hours as needed. Max Daily Amount: 4 Tabs., Print, Disp-60 Tab, R-0  -     HYDROcodone-acetaminophen (NORCO) 5-325 mg per tablet; Take 1 Tab by mouth every six (6) hours as needed. Max Daily Amount: 4 Tabs., Print, Disp-30 Tab, R-0    Other orders  -     amLODIPine (NORVASC) 5 mg tablet; Take 1 Tab by mouth daily., Normal, Disp-90 Tab, R-1  -     furosemide (LASIX) 80 mg tablet; Take 1 Tab by mouth two (2) times a day., Normal, Disp-90 Tab, R-1  -     metoprolol succinate (TOPROL-XL) 25 mg XL tablet; Take 3 Tabs by  mouth daily., Normal, Disp-90 Tab, R-1  -     Cancel: acetaminophen (TYLENOL) 325 mg tablet; Take 2 Tabs by mouth every six (6) hours as needed., Normal, Disp-90 Tab, R-1  -     potassium chloride SR (KLOR-CON 10) 10 mEq tablet; Take 1 Tab by mouth two (2) times a day., Normal, Disp-90 Tab, R-1  -     spironolactone (ALDACTONE) 50 mg tablet; Take 1 Tab by mouth daily., Normal, Disp-90 Tab, R-1  -     methocarbamol (ROBAXIN) 500 mg tablet; Take 1 Tab by mouth every eight (8) hours as needed., Normal, Disp-90 Tab, R-1  -     aspirin delayed-release 81 mg tablet; Take 1 Tab by mouth daily., Normal, Disp-90 Tab, R-1  -     Cancel: cloNIDine HCl (CATAPRES) 0.1 mg tablet; Normal, Disp-90 Tab, R-1      Patient Instructions          Body Mass Index: Care Instructions  Your Care Instructions    Body mass index (BMI) can help you see if your weight is raising your risk for health problems. It uses a formula to compare how much you weigh with how tall you are.  ?? A BMI lower than 18.5 is considered underweight.  ?? A BMI between 18.5 and 24.9  is considered healthy.  ?? A BMI between 25 and 29.9 is considered overweight. A BMI of 30 or higher is considered obese.  If your BMI is in the normal range, it means that you have a lower risk for weight-related health problems. If your BMI is in the overweight or obese range, you may be at increased risk for weight-related health problems, such as high blood pressure, heart disease, stroke, arthritis or joint pain, and diabetes. If your BMI is in the underweight range, you may be at increased risk for health problems such as fatigue, lower protection (immunity) against illness, muscle loss, bone loss, hair loss, and hormone problems.  BMI is just one measure of your risk for weight-related health problems. You may be at higher risk for health problems if you are not active, you eat an unhealthy diet, or you drink too much alcohol or use tobacco products.   Follow-up care is a key part of your treatment and safety. Be sure to make and go to all appointments, and call your doctor if you are having problems. It's also a good idea to know your test results and keep a list of the medicines you take.  How can you care for yourself at home?  ?? Practice healthy eating habits. This includes eating plenty of fruits, vegetables, whole grains, lean protein, and low-fat dairy.  ?? If your doctor recommends it, get more exercise. Walking is a good choice. Bit by bit, increase the amount you walk every day. Try for at least 30 minutes on most days of the week.  ?? Do not smoke. Smoking can increase your risk for health problems. If you need help quitting, talk to your doctor about stop-smoking programs and medicines. These can increase your chances of quitting for good.  ?? Limit alcohol to 2 drinks a day for men and 1 drink a day for women. Too much alcohol can cause health problems.  If you have a BMI higher than 25  ?? Your doctor may do other tests to check your risk for weight-related health problems. This may include measuring the distance around your waist. A waist measurement of more than 40 inches in men or 35 inches in women can increase the risk of weight-related health problems.  ?? Talk with your doctor about steps you can take to stay healthy or improve your health. You may need to make lifestyle changes to lose weight and stay healthy, such as changing your diet and getting regular exercise.  If you have a BMI lower than 18.5  ?? Your doctor may do other tests to check your risk for health problems.  ?? Talk with your doctor about steps you can take to stay healthy or improve your health. You may need to make lifestyle changes to gain or maintain weight and stay healthy, such as getting more healthy foods in your diet and doing exercises to build muscle.  Where can you learn more?  Go to [employer].   Enter S176 in the search box to learn more about "Body Mass Index: Care Instructions."  Current as of: March 15, 2015  Content Version: 11.4  ?? 2006-2017 Healthwise, Inc[employer]. Care instructions adapted under license by [employer] (which disclaims liability or warranty for this information). If you have questions about a medical condition or this instruction, always ask your healthcare professional. [place] any warranty or liability for your use of this information.      Follow-up Disposition:  Return in about  3 months (around 07/30/2017), or if symptoms worsen or fail to improve, for routine follow up.

## 2017-04-30 NOTE — Patient Instructions (Addendum)
Body Mass Index: Care Instructions  Your Care Instructions    Body mass index (BMI) can help you see if your weight is raising your risk for health problems. It uses a formula to compare how much you weigh with how tall you are.  ?? A BMI lower than 18.5 is considered underweight.  ?? A BMI between 18.5 and 24.9 is considered healthy.  ?? A BMI between 25 and 29.9 is considered overweight. A BMI of 30 or higher is considered obese.  If your BMI is in the normal range, it means that you have a lower risk for weight-related health problems. If your BMI is in the overweight or obese range, you may be at increased risk for weight-related health problems, such as high blood pressure, heart disease, stroke, arthritis or joint pain, and diabetes. If your BMI is in the underweight range, you may be at increased risk for health problems such as fatigue, lower protection (immunity) against illness, muscle loss, bone loss, hair loss, and hormone problems.  BMI is just one measure of your risk for weight-related health problems. You may be at higher risk for health problems if you are not active, you eat an unhealthy diet, or you drink too much alcohol or use tobacco products.  Follow-up care is a key part of your treatment and safety. Be sure to make and go to all appointments, and call your doctor if you are having problems. It's also a good idea to know your test results and keep a list of the medicines you take.  How can you care for yourself at home?  ?? Practice healthy eating habits. This includes eating plenty of fruits, vegetables, whole grains, lean protein, and low-fat dairy.  ?? If your doctor recommends it, get more exercise. Walking is a good choice. Bit by bit, increase the amount you walk every day. Try for at least 30 minutes on most days of the week.  ?? Do not smoke. Smoking can increase your risk for health problems. If you need help quitting, talk to your doctor about stop-smoking programs and  medicines. These can increase your chances of quitting for good.  ?? Limit alcohol to 2 drinks a day for men and 1 drink a day for women. Too much alcohol can cause health problems.  If you have a BMI higher than 25  ?? Your doctor may do other tests to check your risk for weight-related health problems. This may include measuring the distance around your waist. A waist measurement of more than 40 inches in men or 35 inches in women can increase the risk of weight-related health problems.  ?? Talk with your doctor about steps you can take to stay healthy or improve your health. You may need to make lifestyle changes to lose weight and stay healthy, such as changing your diet and getting regular exercise.  If you have a BMI lower than 18.5  ?? Your doctor may do other tests to check your risk for health problems.  ?? Talk with your doctor about steps you can take to stay healthy or improve your health. You may need to make lifestyle changes to gain or maintain weight and stay healthy, such as getting more healthy foods in your diet and doing exercises to build muscle.  Where can you learn more?  Go to http://www.healthwise.net/[employer].  Enter S176 in the search box to learn more about "Body Mass Index: Care Instructions."  Current as of: March 15, 2015  Content Version: 11.4  ??   2006-2017 Healthwise, [employer]. Care instructions adapted under license by [employer] (which disclaims liability or warranty for this information). If you have questions about a medical condition or this instruction, always ask your healthcare professional. Healthwise, Incorporated disclaims any warranty or liability for your use of this information.

## 2017-04-30 NOTE — Progress Notes (Signed)
Chief Complaint   Patient presents with   ??? Hospital Follow Up     [facility] for Respiratory failure       1. Have you been to the ER, urgent care clinic since your last visit?  Hospitalized since your last visit? Yes [facility] 03/11/17-04/04/17 for respiratory failure      2. Have you seen or consulted any other health care providers outside of [facility] since your last visit?  Include any pap smears or colon screening. no

## 2017-05-01 ENCOUNTER — Encounter: Admit: 2017-05-01 | Discharge: 2017-05-01 | Payer: MEDICARE | Primary: Internal Medicine

## 2017-05-01 ENCOUNTER — Encounter: Primary: Internal Medicine

## 2017-05-02 MED ORDER — HYDROCODONE-ACETAMINOPHEN 5 MG-325 MG TAB
5-325 mg | ORAL_TABLET | Freq: Four times a day (QID) | ORAL | 0 refills | Status: DC | PRN
Start: 2017-05-02 — End: 2017-05-01

## 2017-05-02 MED ORDER — METOPROLOL SUCCINATE SR 25 MG 24 HR TAB
25 mg | ORAL_TABLET | Freq: Every day | ORAL | 1 refills | Status: DC
Start: 2017-05-02 — End: 2017-08-11

## 2017-05-02 MED ORDER — ASPIRIN 81 MG TAB, DELAYED RELEASE
81 mg | ORAL_TABLET | Freq: Every day | ORAL | 1 refills | Status: AC
Start: 2017-05-02 — End: ?

## 2017-05-02 MED ORDER — AMLODIPINE 5 MG TAB
5 mg | ORAL_TABLET | Freq: Every day | ORAL | 1 refills | Status: DC
Start: 2017-05-02 — End: 2017-08-13

## 2017-05-02 MED ORDER — HYDROCODONE-ACETAMINOPHEN 5 MG-325 MG TAB
5-325 mg | ORAL_TABLET | Freq: Four times a day (QID) | ORAL | 0 refills | Status: DC | PRN
Start: 2017-05-02 — End: 2017-07-30

## 2017-05-02 MED ORDER — SPIRONOLACTONE 50 MG TAB
50 mg | ORAL_TABLET | Freq: Every day | ORAL | 1 refills | Status: DC
Start: 2017-05-02 — End: 2017-11-07

## 2017-05-02 MED ORDER — METHOCARBAMOL 500 MG TAB
500 mg | ORAL_TABLET | Freq: Three times a day (TID) | ORAL | 1 refills | Status: DC | PRN
Start: 2017-05-02 — End: 2017-10-21

## 2017-05-02 MED ORDER — POTASSIUM CHLORIDE SR 10 MEQ TAB
10 mEq | ORAL_TABLET | Freq: Two times a day (BID) | ORAL | 1 refills | Status: DC
Start: 2017-05-02 — End: 2017-05-01

## 2017-05-02 MED ORDER — FUROSEMIDE 80 MG TAB
80 mg | ORAL_TABLET | Freq: Two times a day (BID) | ORAL | 1 refills | Status: DC
Start: 2017-05-02 — End: 2017-05-01

## 2017-05-02 MED ORDER — PRAVASTATIN 20 MG TAB
20 mg | ORAL_TABLET | Freq: Every evening | ORAL | 1 refills | Status: AC
Start: 2017-05-02 — End: ?

## 2017-05-03 MED ORDER — FUROSEMIDE 80 MG TAB
80 mg | ORAL_TABLET | ORAL | 1 refills | Status: DC
Start: 2017-05-03 — End: 2017-08-09

## 2017-05-03 MED ORDER — POTASSIUM CHLORIDE SR 10 MEQ TAB
10 mEq | ORAL_TABLET | ORAL | 1 refills | Status: DC
Start: 2017-05-03 — End: 2017-10-25

## 2017-05-04 ENCOUNTER — Encounter: Admit: 2017-05-04 | Discharge: 2017-05-04 | Payer: MEDICARE | Primary: Internal Medicine

## 2017-05-05 ENCOUNTER — Encounter: Admit: 2017-05-05 | Discharge: 2017-05-05 | Payer: MEDICARE | Primary: Internal Medicine

## 2017-05-05 NOTE — Progress Notes (Signed)
Received message from patient requesting a call back. Spoke with patient after verifying name and DOB.Pateint reports she is doing well with PT. Requested to call writer back at a different time.     Goals     ??? Returns to baseline activity level.      12/05/16 Patient reports she has gained weight d/t no portion control and having sodas back in diet. Patient refused to give writer number.She has followed low Na and fluid restrictions. Blood sugar has been between 120-140. Patient continues to walk. Patient is to cut back on soda,use portion control, and record weights daily and report at next week outreach. SAJ    12/10/16 Patient reports back pain today. Seeing PCP today. She is following low Na diet. Patient has agreed to give up soda. Will report outreach in 2 weeks. SAJ    02/10/17 Patient reports she is still in rehab and does not have a discharge date at this time. She will not be moving but will remain with brother. She will need knee surgery after discharge and will need to see back specialist. Will continue to monitor patient in rehab. SAJ    03/16/17 Patient [facility] She will see ortho and back specialist while inpatient. She has requested this writer cancel cardio and TOC appointment since ishe is admitted at this time. Appointments canceled as requested. Will follow up with patient upon discharge. SAJ    04/21/17 Patient report [facility] She is weighing daily weight today and yesterday was 266. Patient will continue     05/06/17 Patient reports weight as 260. Denies increase in SOB, chest pain. PT is working with patient to use cane instead of walker. Patient will continue to report weights at next outreach. SAJ

## 2017-05-06 ENCOUNTER — Encounter: Admit: 2017-05-06 | Discharge: 2017-05-06 | Payer: MEDICARE | Primary: Internal Medicine

## 2017-05-07 ENCOUNTER — Encounter: Primary: Internal Medicine

## 2017-05-08 ENCOUNTER — Encounter: Admit: 2017-05-08 | Discharge: 2017-05-08 | Payer: MEDICARE | Primary: Internal Medicine

## 2017-05-11 ENCOUNTER — Encounter: Primary: Internal Medicine

## 2017-05-12 ENCOUNTER — Encounter: Admit: 2017-05-12 | Discharge: 2017-05-12 | Payer: MEDICARE | Primary: Internal Medicine

## 2017-05-13 ENCOUNTER — Encounter: Admit: 2017-05-13 | Discharge: 2017-05-13 | Payer: MEDICARE | Primary: Internal Medicine

## 2017-05-14 ENCOUNTER — Encounter: Admit: 2017-05-14 | Discharge: 2017-05-14 | Payer: MEDICARE | Primary: Internal Medicine

## 2017-05-14 ENCOUNTER — Encounter: Primary: Internal Medicine

## 2017-05-14 MED ORDER — ISOSORBIDE DINITRATE 10 MG TAB
10 mg | ORAL_TABLET | ORAL | 0 refills | Status: DC
Start: 2017-05-14 — End: 2017-06-10

## 2017-05-15 ENCOUNTER — Encounter: Admit: 2017-05-15 | Discharge: 2017-05-15 | Payer: MEDICARE | Primary: Internal Medicine

## 2017-05-18 ENCOUNTER — Inpatient Hospital Stay: Admit: 2017-07-21 | Payer: MEDICARE | Primary: Internal Medicine

## 2017-05-18 ENCOUNTER — Encounter: Admit: 2017-05-18 | Discharge: 2017-05-18 | Payer: MEDICARE | Primary: Internal Medicine

## 2017-05-18 DIAGNOSIS — E7800 Pure hypercholesterolemia, unspecified: Secondary | ICD-10-CM

## 2017-05-19 LAB — CBC WITH AUTOMATED DIFF
ABS. BASOPHILS: 0 10*3/uL (ref 0.0–0.2)
ABS. EOSINOPHILS: 0.4 10*3/uL (ref 0.0–0.4)
ABS. IMM. GRANS.: 0 10*3/uL (ref 0.0–0.1)
ABS. MONOCYTES: 0.7 10*3/uL (ref 0.1–0.9)
ABS. NEUTROPHILS: 2.8 10*3/uL (ref 1.4–7.0)
Abs Lymphocytes: 1.4 10*3/uL (ref 0.7–3.1)
BASOPHILS: 1 %
EOSINOPHILS: 7 %
HCT: 42.8 % (ref 34.0–46.6)
HGB: 14.1 g/dL (ref 11.1–15.9)
IMMATURE GRANULOCYTES: 0 %
Lymphocytes: 27 %
MCH: 28.4 pg (ref 26.6–33.0)
MCHC: 32.9 g/dL (ref 31.5–35.7)
MCV: 86 fL (ref 79–97)
MONOCYTES: 12 %
NEUTROPHILS: 53 %
PLATELET: 271 10*3/uL (ref 150–379)
RBC: 4.97 x10E6/uL (ref 3.77–5.28)
RDW: 17.9 % — ABNORMAL HIGH (ref 12.3–15.4)
WBC: 5.3 10*3/uL (ref 3.4–10.8)

## 2017-05-19 LAB — LIPID PANEL
Cholesterol, total: 256 mg/dL — ABNORMAL HIGH (ref 100–199)
HDL Cholesterol: 40 mg/dL (ref >39)
LDL, calculated: 190 mg/dL — ABNORMAL HIGH (ref 0–99)
Triglyceride: 130 mg/dL (ref 0–149)
VLDL, calculated: 26 mg/dL (ref 5–40)

## 2017-05-19 LAB — METABOLIC PANEL, COMPREHENSIVE
A-G Ratio: 1.3 (ref 1.2–2.2)
ALT (SGPT): 4 IU/L (ref 0–32)
AST (SGOT): 10 IU/L (ref 0–40)
Albumin: 4.3 g/dL (ref 3.6–4.8)
Alk. phosphatase: 67 IU/L (ref 39–117)
BUN/Creatinine ratio: 23 (ref 12–28)
BUN: 25 mg/dL (ref 8–27)
Bilirubin, total: 0.6 mg/dL (ref 0.0–1.2)
CO2: 25 mmol/L (ref 20–29)
Calcium: 9.7 mg/dL (ref 8.7–10.3)
Chloride: 99 mmol/L (ref 96–106)
Creatinine: 1.11 mg/dL — ABNORMAL HIGH (ref 0.57–1.00)
GFR est AA: 59 mL/min/{1.73_m2} — ABNORMAL LOW (ref >59)
GFR est non-AA: 51 mL/min/{1.73_m2} — ABNORMAL LOW (ref >59)
GLOBULIN, TOTAL: 3.2 g/dL (ref 1.5–4.5)
Glucose: 101 mg/dL — ABNORMAL HIGH (ref 65–99)
Potassium: 4.3 mmol/L (ref 3.5–5.2)
Protein, total: 7.5 g/dL (ref 6.0–8.5)
Sodium: 144 mmol/L (ref 134–144)

## 2017-05-19 LAB — DIABETES PATIENT EDUCATION

## 2017-05-19 LAB — MICROALBUMIN, UR, RAND W/ MICROALB/CREAT RATIO
Creatinine, urine random: 219.9 mg/dL
Microalb/Creat ratio (ug/mg creat.): 23.6 mg/g creat (ref 0.0–30.0)
Microalbumin, urine: 51.8 ug/mL

## 2017-05-19 LAB — HEMOGLOBIN A1C WITH EAG
Estimated average glucose: 137 mg/dL
Hemoglobin A1c: 6.4 % — ABNORMAL HIGH (ref 4.8–5.6)

## 2017-05-19 LAB — URINALYSIS W/ RFLX MICROSCOPIC

## 2017-05-19 LAB — VITAMIN D, 25 HYDROXY: VITAMIN D, 25-HYDROXY: 38 ng/mL (ref 30.0–100.0)

## 2017-05-19 LAB — CKD REPORT

## 2017-05-25 NOTE — Telephone Encounter (Signed)
Left message on Kyle's voicemail giving verbal order per Dr. Raiford Simmondsomah that PT can be extended for 4 more visits. Also ask to please fax over orders.

## 2017-05-25 NOTE — Telephone Encounter (Signed)
Kyle w/[facility] -  would like an order to extend PT for  4 more visits     Best number tor each him is [phone]

## 2017-05-27 ENCOUNTER — Encounter: Admit: 2017-05-27 | Discharge: 2017-05-27 | Payer: MEDICARE | Primary: Internal Medicine

## 2017-05-28 ENCOUNTER — Encounter: Admit: 2017-05-28 | Discharge: 2017-05-28 | Payer: MEDICARE | Primary: Internal Medicine

## 2017-05-29 NOTE — ACP (Advance Care Planning) (Signed)
Non-Provider Advance Care Planning (ACP) Note    Date of ACP Conversation: 05/29/2017  Persons included in Conversation: patient  Length of ACP Conversation in minutes: 1 minute    Conversation requested by:   Other: Nurse Navigator    Review of Existing Advance Directive: (Select questions covered)  Does this advance directive still reflect your preferences?  Yes

## 2017-05-29 NOTE — Progress Notes (Signed)
Goals Addressed                 This Visit's Progress    ??? Returns to baseline activity level.        12/05/16 Patient reports she has gained weight d/t no portion control and having sodas back in diet. Patient refused to give writer number.She has followed low Na and fluid restrictions. Blood sugar has been between 120-140. Patient continues to walk. Patient is to cut back on soda,use portion control, and record weights daily and report at next week outreach. SAJ    12/10/16 Patient reports back pain today. Seeing PCP today. She is following low Na diet. Patient has agreed to give up soda. Will report outreach in 2 weeks. SAJ    02/10/17 Patient reports she is still in rehab and does not have a discharge date at this time. She will not be moving but will remain with brother. She will need knee surgery after discharge and will need to see back specialist. Will continue to monitor patient in rehab.SAJ    03/16/17 [facility] She will see ortho and back specialist while inpatient. She has requested this writer cancel cardio and TOC appointment since ishe is admitted at this time. Appointments canceled as requested. Will follow up with patient upon discharge. SAJ    04/21/17 Patient report [facility]. She is weighing daily weight today and yesterday was 266. Patient will continue     05/06/17 Patient reports weight as 260. Denies increase in SOB, chest pain. PT is working with patient to use cane instead of walker. Patient will continue to report weights at next outreach. SAJ    05/29/2017  10:26am Called and spoke to patient, she is feeling well, denies SOB, cough or C/P.   Patient continues to use her O2 at 4/l via N/C and has SN and PT for Home Health. Patient seen by Dr Raiford Simmondsomah on 12/24 and scheduled again on 2/28. Patient has not seen Dr Orvil FeilKaminsky as of yet.   Called and schedule this for 1/21/at 1:45pm.  Patient called and is aware of this appt.  Reviewed with patient that she needs to weigh daily, currently weighs about 3 times a week. Also reviewed low sodium diet and what to do if she does gain 2 lbs in 1 day or 5 lbs in 1 week- Call Provider.  Monitor that patient attends Cardiology appt, does weigh daily and for S/S CHF on next call.  [facility]

## 2017-05-30 MED ORDER — DICLOFENAC 1 % TOPICAL GEL
1 % | CUTANEOUS | 0 refills | Status: DC
Start: 2017-05-30 — End: 2017-07-30

## 2017-06-01 ENCOUNTER — Encounter: Admit: 2017-06-01 | Discharge: 2017-06-01 | Payer: MEDICARE | Primary: Internal Medicine

## 2017-06-02 ENCOUNTER — Encounter: Primary: Internal Medicine

## 2017-06-03 ENCOUNTER — Encounter: Primary: Internal Medicine

## 2017-06-04 ENCOUNTER — Encounter: Primary: Internal Medicine

## 2017-06-04 ENCOUNTER — Encounter: Admit: 2017-06-04 | Discharge: 2017-06-04 | Payer: MEDICARE | Primary: Internal Medicine

## 2017-06-04 NOTE — Progress Notes (Signed)
Goals Addressed                 This Visit's Progress    ??? Returns to baseline activity level.        12/05/16 Patient reports she has gained weight d/t no portion control and having sodas back in diet. Patient refused to give writer number.She has followed low Na and fluid restrictions. Blood sugar has been between 120-140. Patient continues to walk. Patient is to cut back on soda,use portion control, and record weights daily and report at next week outreach. SAJ    12/10/16 Patient reports back pain today. Seeing PCP today. She is following low Na diet. Patient has agreed to give up soda. Will report outreach in 2 weeks. SAJ    02/10/17 Patient reports she is still in rehab and does not have a discharge date at this time. She will not be moving but will remain with brother. She will need knee surgery after discharge and will need to see back specialist. Will continue to monitor patient in rehab.SAJ    03/16/17 Patient reports [facility] is [facility]. She will see ortho and back specialist while inpatient. She has requested this writer cancel cardio and TOC appointment since ishe is admitted at this time. Appointments canceled as requested. Will follow up with patient upon discharge. SAJ    04/21/17 Patient report [facility] She is weighing daily weight today and yesterday was 266. Patient will continue     05/06/17 Patient reports weight as 260. Denies increase in SOB, chest pain. PT is working with patient to use cane instead of walker. Patient will continue to report weights at next outreach. SAJ    05/29/2017  10:26am Called and spoke to patient, she is feeling well, denies SOB, cough or C/P.   Patient continues to use her O2 at 4/l via N/C and has SN and PT for Home Health. Patient seen by Dr Raiford Simmondsomah on 12/24 and scheduled again on 2/28. Patient has not seen Dr Orvil FeilKaminsky as of yet.   Called and schedule this for 1/21/at 1:45pm.  Patient called and is aware of this appt.  Reviewed with patient that she needs to weigh daily, currently weighs about 3 times a week. Also reviewed low sodium diet and what to do if she does gain 2 lbs in 1 day or 5 lbs in 1 week- Call Provider.  Monitor that patient attends Cardiology appt, does weigh daily and for S/S CHF on next call.  [facility]        06/04/17 Patient reports weight is holding steady at 259 lbs. She is using walker. HH PT ends this week. Complaint with diet and fluid restriction. Will continue to monitor weights and report at next week outreach. SAJ          Patient requested refill on Diclofenac gel. Janelle FloorNaomi, LPN advised of refill request.

## 2017-06-05 ENCOUNTER — Encounter: Primary: Internal Medicine

## 2017-06-09 ENCOUNTER — Encounter: Primary: Internal Medicine

## 2017-06-10 ENCOUNTER — Encounter: Primary: Internal Medicine

## 2017-06-10 ENCOUNTER — Encounter: Admit: 2017-06-10 | Discharge: 2017-06-10 | Payer: MEDICARE | Primary: Internal Medicine

## 2017-06-10 MED ORDER — ISOSORBIDE DINITRATE 10 MG TAB
10 mg | ORAL_TABLET | ORAL | 0 refills | Status: DC
Start: 2017-06-10 — End: 2017-07-10

## 2017-06-16 ENCOUNTER — Encounter: Primary: Internal Medicine

## 2017-06-19 NOTE — Progress Notes (Signed)
Goals     ??? Returns to baseline activity level.      12/05/16 Patient reports she has gained weight d/t no portion control and having sodas back in diet. Patient refused to give writer number.She has followed low Na and fluid restrictions. Blood sugar has been between 120-140. Patient continues to walk. Patient is to cut back on soda,use portion control, and record weights daily and report at next week outreach. SAJ    12/10/16 Patient reports back pain today. Seeing PCP today. She is following low Na diet. Patient has agreed to give up soda. Will report outreach in 2 weeks. SAJ    02/10/17 Patient reports she is still in rehab and does not have a discharge date at this time. She will not be moving but will remain with brother. She will need knee surgery after discharge and will need to see back specialist. Will continue to monitor patient in rehab.SAJ    03/16/17 Patient [facility]. She will see ortho and back specialist while inpatient. She has requested this writer cancel cardio and TOC appointment since ishe is admitted at this time. Appointments canceled as requested. Will follow up with patient upon discharge. SAJ    04/21/17 Patient report [facility] She is weighing daily weight today and yesterday was 266. Patient will continue     05/06/17 Patient reports weight as 260. Denies increase in SOB, chest pain. PT is working with patient to use cane instead of walker. Patient will continue to report weights at next outreach. SAJ    05/29/2017  10:26am Called and spoke to patient, she is feeling well, denies SOB, cough or C/P.   Patient continues to use her O2 at 4/l via N/C and has SN and PT for Home Health. Patient seen by Dr Raiford Simmondsomah on 12/24 and scheduled again on 2/28. Patient has not seen Dr Orvil FeilKaminsky as of yet.  Called and schedule this for 1/21/at 1:45pm.  Patient called and is aware  of this appt.  Reviewed with patient that she needs to weigh daily, currently weighs about 3 times a week. Also reviewed low sodium diet and what to do if she does gain 2 lbs in 1 day or 5 lbs in 1 week- Call Provider.  Monitor that patient attends Cardiology appt, does weigh daily and for S/S CHF on next call.  [facility]        06/04/17 Patient reports weight is holding steady at 259 lbs. She is using walker. HH PT ends this week. Complaint with diet and fluid restriction. Will continue to monitor weights and report at next week outreach. SAJ    06/19/17 Patient reports today's weight as 258 lbs. Denies edema, increase in SOB, chest pain. HH has been completed. Still using walker but can use cane at times. Will monitor for CHF S&S at next week outreach. SAJ

## 2017-06-22 ENCOUNTER — Encounter: Attending: Cardiovascular Disease | Primary: Internal Medicine

## 2017-06-26 NOTE — Progress Notes (Signed)
Goals     ??? Returns to baseline activity level.      12/05/16 Patient reports she has gained weight d/t no portion control and having sodas back in diet. Patient refused to give writer number.She has followed low Na and fluid restrictions. Blood sugar has been between 120-140. Patient continues to walk. Patient is to cut back on soda,use portion control, and record weights daily and report at next week outreach. SAJ    12/10/16 Patient reports back pain today. Seeing PCP today. She is following low Na diet. Patient has agreed to give up soda. Will report outreach in 2 weeks. SAJ    02/10/17 Patient reports she is still in rehab and does not have a discharge date at this time. She will not be moving but will remain with brother. She will need knee surgery after discharge and will need to see back specialist. Will continue to monitor patient in rehab.SAJ    03/16/17 Patient [facility] She will see ortho and back specialist while inpatient. She has requested this writer cancel cardio and TOC appointment since ishe is admitted at this time. Appointments canceled as requested. Will follow up with patient upon discharge. SAJ    04/21/17 Patient report [facility] She is weighing daily weight today and yesterday was 266. Patient will continue     05/06/17 Patient reports weight as 260. Denies increase in SOB, chest pain. PT is working with patient to use cane instead of walker. Patient will continue to report weights at next outreach. SAJ    05/29/2017  10:26am Called and spoke to patient, she is feeling well, denies SOB, cough or C/P.   Patient continues to use her O2 at 4/l via N/C and has SN and PT for Home Health. Patient seen by Dr Raiford Simmondsomah on 12/24 and scheduled again on 2/28. Patient has not seen Dr Orvil FeilKaminsky as of yet.  Called and schedule this for 1/21/at 1:45pm.  Patient called and is aware  of this appt.  Reviewed with patient that she needs to weigh daily, currently weighs about 3 times a week. Also reviewed low sodium diet and what to do if she does gain 2 lbs in 1 day or 5 lbs in 1 week- Call Provider.  Monitor that patient attends Cardiology appt, does weigh daily and for S/S CHF on next call.  [facility]        06/04/17 Patient reports weight is holding steady at 259 lbs. She is using walker. HH PT ends this week. Complaint with diet and fluid restriction. Will continue to monitor weights and report at next week outreach. SAJ    06/19/17 Patient reports today's weight as 258 lbs. Denies edema, increase in SOB, chest pain. HH has been completed. Still using walker but can use cane at times. Will monitor for CHF S&S at next week outreach. SAJ    06/26/17 Patient reports she is walking in her home. Weight is stable at  259. Complaint with diet, medications, and fluid restriction. Pateint will contact next week to report CHF S&S and weights. SAJ

## 2017-07-03 NOTE — Progress Notes (Signed)
Patient has graduated from the Disease Specific Care Management  program on 07/03/17.  Patient's symptoms are stable at this time.  Patient/family has the ability to self-manage.   Care management goals have been completed at this time. No further nurse navigator follow up scheduled.    Goals Addressed                 This Visit's Progress    ??? COMPLETED: Returns to baseline activity level.        12/05/16 Patient reports she has gained weight d/t no portion control and having sodas back in diet. Patient refused to give writer number.She has followed low Na and fluid restrictions. Blood sugar has been between 120-140. Patient continues to walk. Patient is to cut back on soda,use portion control, and record weights daily and report at next week outreach. SAJ    12/10/16 Patient reports back pain today. Seeing PCP today. She is following low Na diet. Patient has agreed to give up soda. Will report outreach in 2 weeks. SAJ    02/10/17 Patient reports she is still in rehab and does not have a discharge date at this time. She will not be moving but will remain with brother. She will need knee surgery after discharge and will need to see back specialist. Will continue to monitor patient in rehab.SAJ    03/16/17 Patient reports [facility]. She will see ortho and back specialist while inpatient. She has requested this writer cancel cardio and TOC appointment since ishe is admitted at this time. Appointments canceled as requested. Will follow up with patient upon discharge. SAJ    04/21/17 [facility] report [facility] She is weighing daily weight today and yesterday was 266. Patient will continue     05/06/17 Patient reports weight as 260. Denies increase in SOB, chest pain. PT is working with patient to use cane instead of walker. Patient will continue to report weights at next outreach. SAJ     05/29/2017  10:26am Called and spoke to patient, she is feeling well, denies SOB, cough or C/P.   Patient continues to use her O2 at 4/l via N/C and has SN and PT for Home Health. Patient seen by Dr Raiford Simmondsomah on 12/24 and scheduled again on 2/28. Patient has not seen Dr Orvil FeilKaminsky as of yet.  Called and schedule this for 1/21/at 1:45pm.  Patient called and is aware of this appt.  Reviewed with patient that she needs to weigh daily, currently weighs about 3 times a week. Also reviewed low sodium diet and what to do if she does gain 2 lbs in 1 day or 5 lbs in 1 week- Call Provider.  Monitor that patient attends Cardiology appt, does weigh daily and for S/S CHF on next call.  [facility]        06/04/17 Patient reports weight is holding steady at 259 lbs. She is using walker. HH PT ends this week. Complaint with diet and fluid restriction. Will continue to monitor weights and report at next week outreach. SAJ    06/19/17 Patient reports today's weight as 258 lbs. Denies edema, increase in SOB, chest pain. HH has been completed. Still using walker but can use cane at times. Will monitor for CHF S&S at next week outreach. SAJ    06/26/17 Patient reports she is walking in her home. Weight is stable at  259. Complaint with diet, medications, and fluid  restriction. Pateint will contact next week to report CHF S&S and weights. SAJ    07/03/17 Patient has completed the 90 CHF bundle without readmission. Reported weight is 259. Patient will call office if she has any CHF S&S. SAJ            Pt has nurse navigator's contact information for any further questions, concerns, or needs.  Patients upcoming visits:    Future Appointments   Date Time Provider Department Center   07/30/2017  2:00 PM Domah, Jackey Loge, MD [facility] SCHED

## 2017-07-06 IMAGING — MG MAMMOGRAPHY SCREENING BILATERAL 3D TOMOSYNTHESIS WITH CAD
4 of 12 series · 4 of 40 positions shown · non-contrast
Comparison: 06/09/2016 through 10/26/2012. 
BREAST DENSITY: (Level B) There are scattered areas of fibroglandular density.

MAMMOGRAPHY SCREENING BILATERAL 3D TOMOSYNTHESIS WITH CAD, 07/06/2017 [DATE]: 
CLINICAL INDICATION: Screening.
TECHNIQUE: Digital bilateral mammograms and 3-D Tomosynthesis were obtained. 
These were interpreted both primarily and with the aid of computer-aided 
detection system.

[L MLO synth-2D]
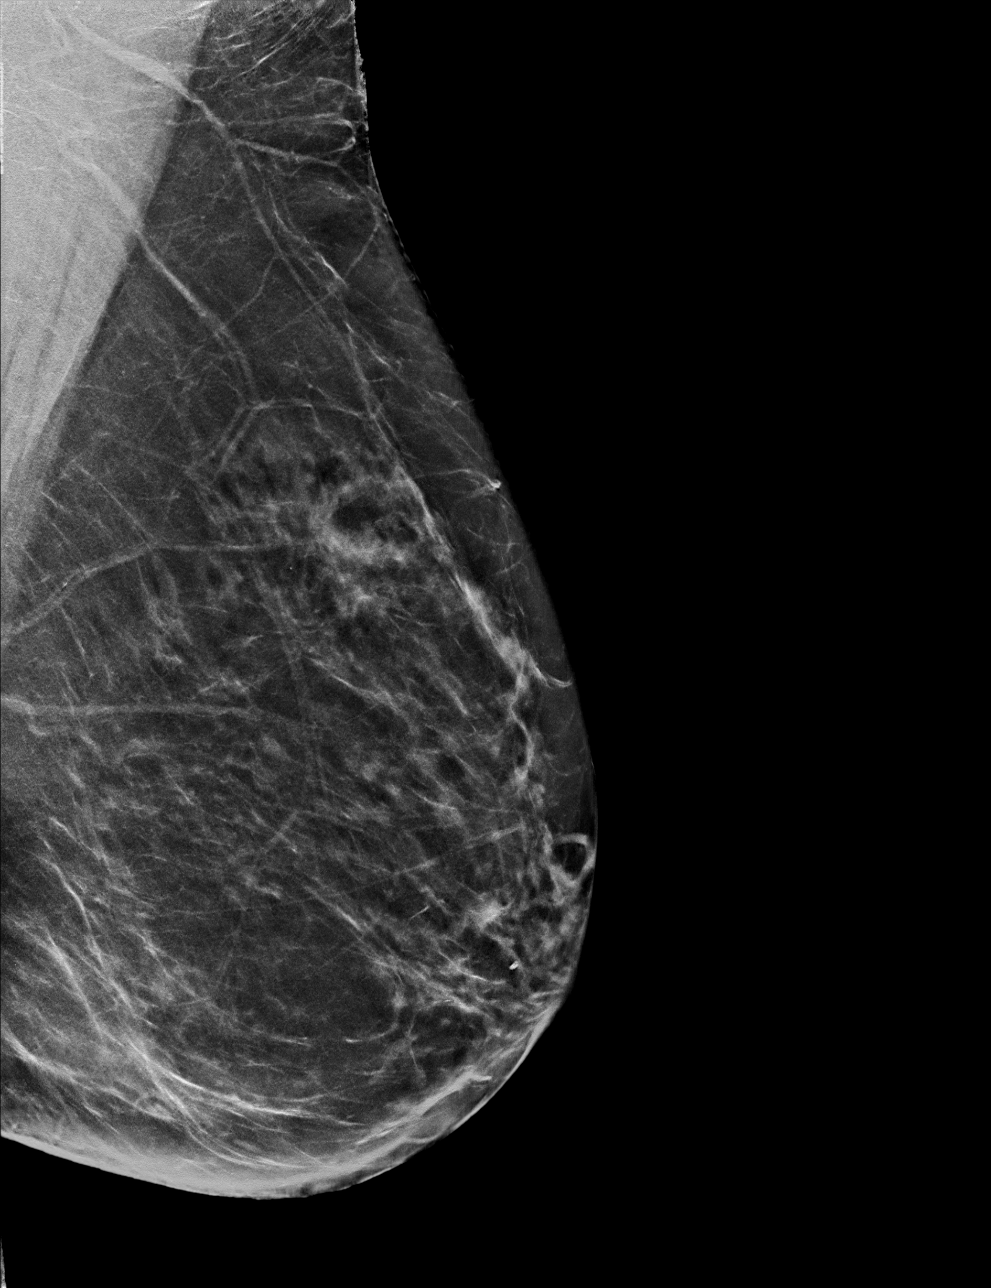

[R MLO synth-2D]
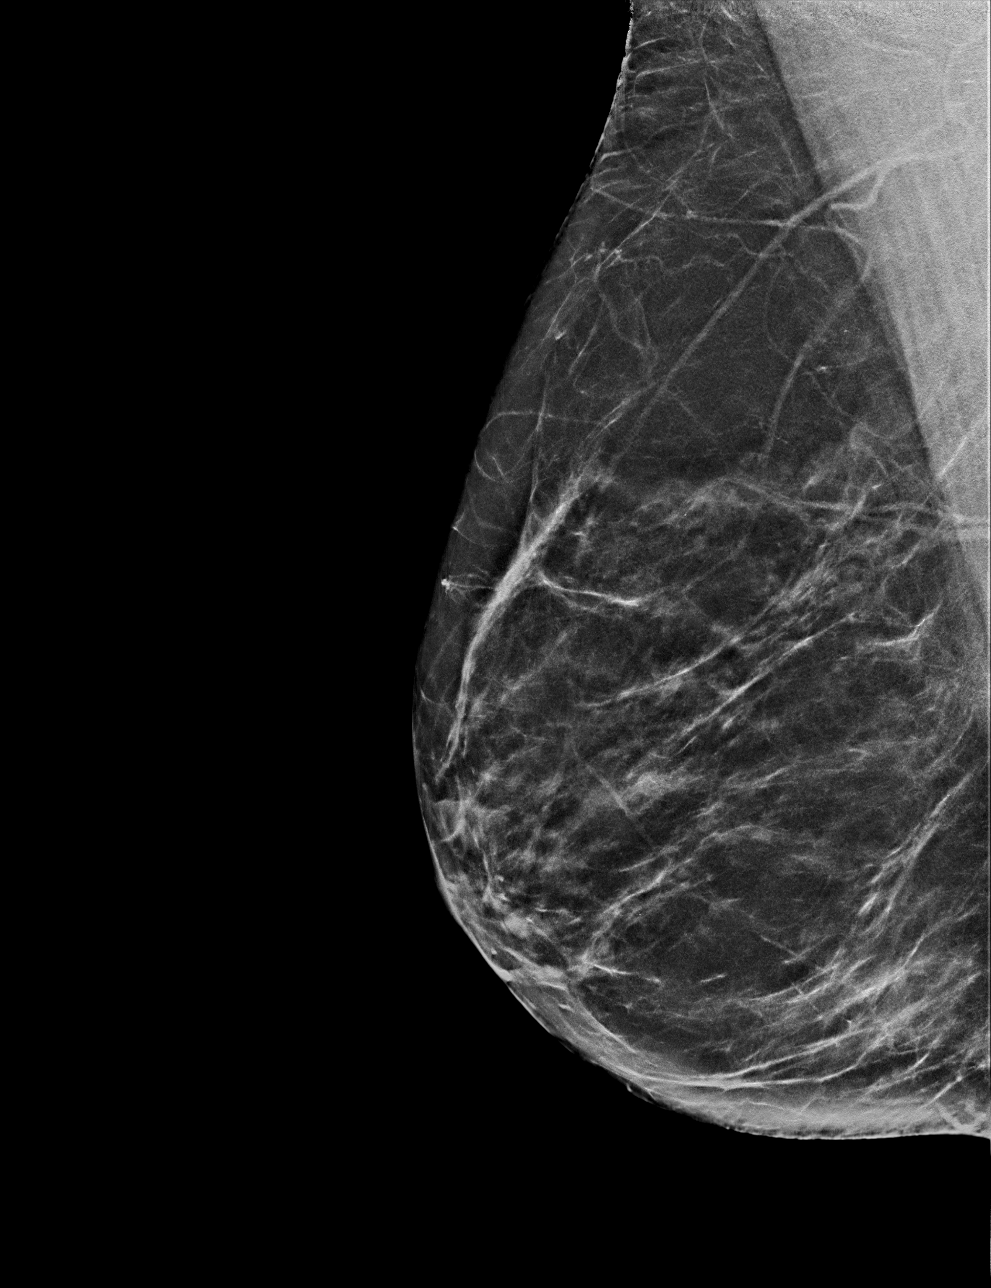

[R CC synth-2D]
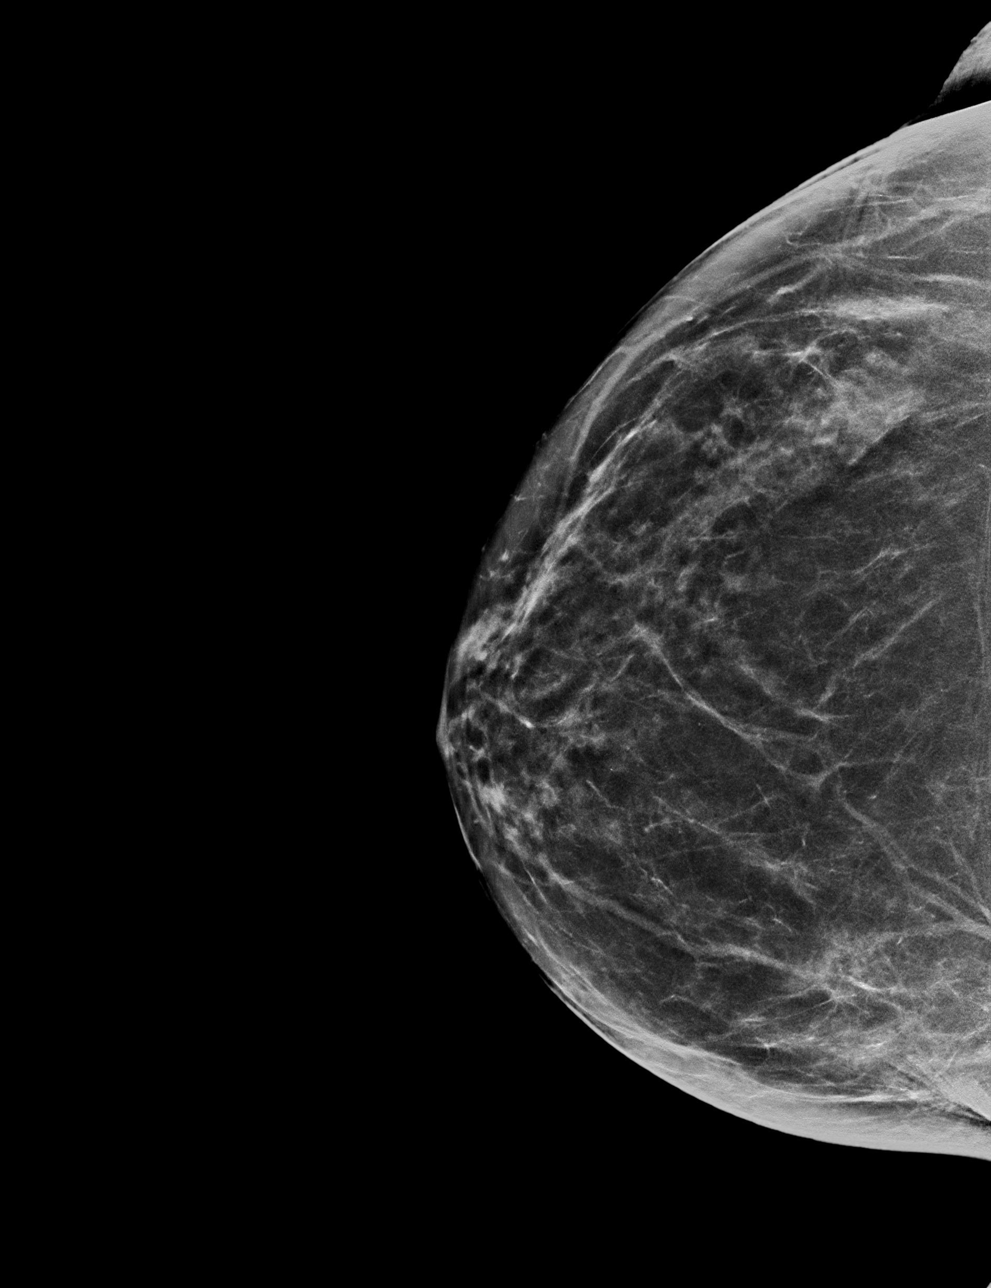

[L CC synth-2D]
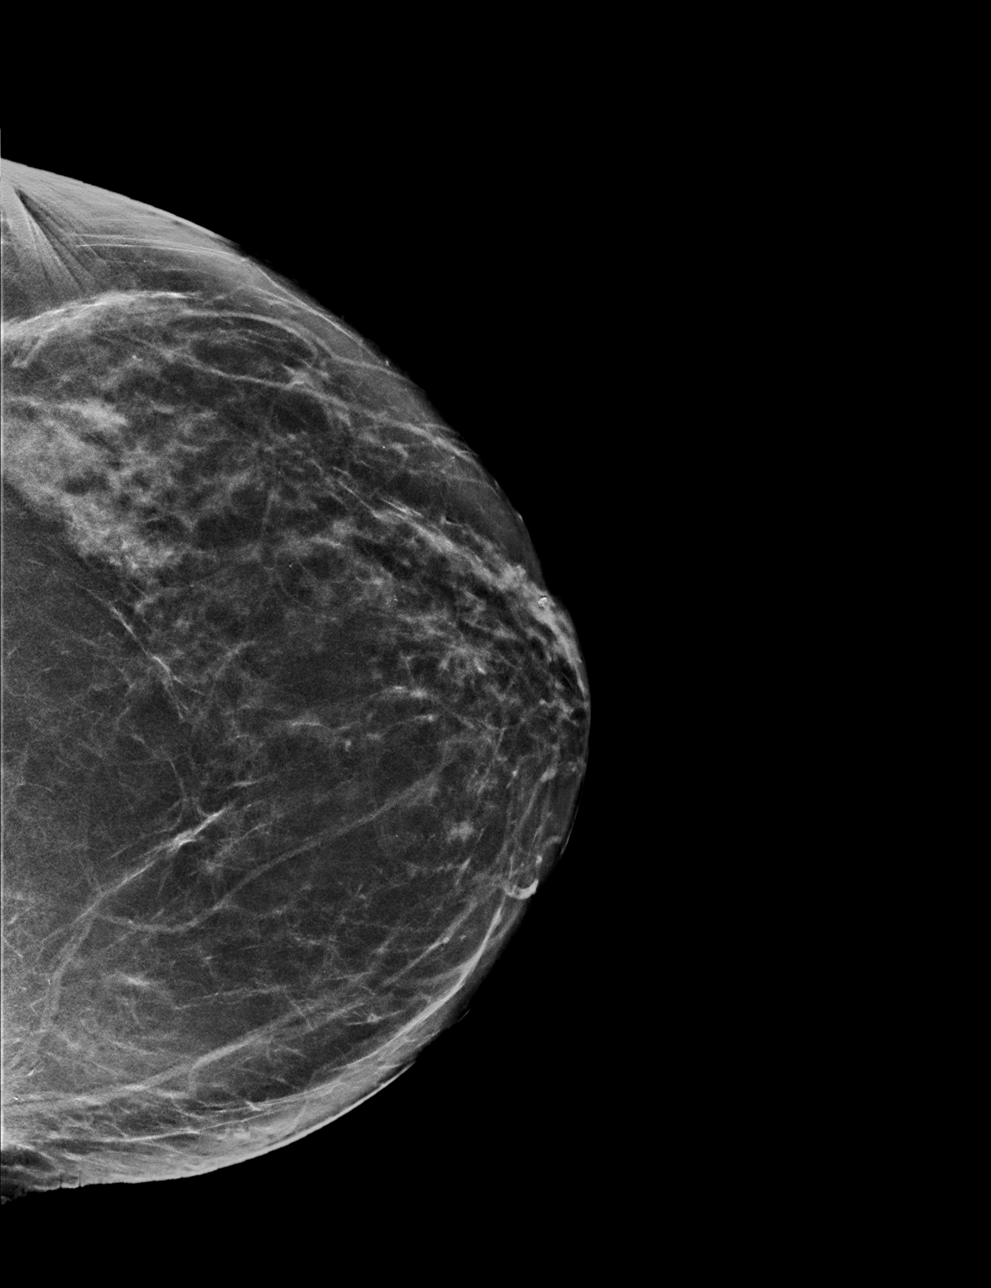

[4 of 40 positions shown; findings below may reference images not displayed]

FINDINGS: No mammographically suspicious abnormality and no significant 
change.
IMPRESSION: ( BI-RADS 2) Benign findings. Routine mammographic follow-up is recommended.

## 2017-07-10 MED ORDER — ISOSORBIDE DINITRATE 10 MG TAB
10 mg | ORAL_TABLET | ORAL | 0 refills | Status: DC
Start: 2017-07-10 — End: 2017-08-06

## 2017-07-30 ENCOUNTER — Ambulatory Visit: Admit: 2017-07-30 | Discharge: 2017-07-30 | Payer: MEDICARE | Attending: Internal Medicine | Primary: Internal Medicine

## 2017-07-30 DIAGNOSIS — Z1231 Encounter for screening mammogram for malignant neoplasm of breast: Secondary | ICD-10-CM

## 2017-07-30 MED ORDER — DICLOFENAC 1 % TOPICAL GEL
1 % | CUTANEOUS | 0 refills | Status: AC
Start: 2017-07-30 — End: ?

## 2017-07-30 MED ORDER — HYDROCODONE-ACETAMINOPHEN 5 MG-325 MG TAB
5-325 mg | ORAL_TABLET | Freq: Three times a day (TID) | ORAL | 0 refills | Status: AC | PRN
Start: 2017-07-30 — End: 2017-08-29

## 2017-07-30 NOTE — Progress Notes (Signed)
Chief Complaint   Patient presents with   ??? Follow-up     3 month      HPI:  Brittney Shelton is a 67 y.o. AA female with h/o Diabetes??(A1C today=6.4%), hypertension, copd on home oxygen(4L), hypercholesterolemia,morbid obesity, diastolic heart failure(most recent EF=60-65%) presents for 3 month f/u.  Patient has been doing well. Blood pressure is at goal on current medication. Patient has no new complaints.    Review of Systems  As per hpi    Past Medical History:   Diagnosis Date   ??? Chronic obstructive pulmonary disease ([place])    ??? Congestive heart failure ([place])    ??? Diabetes ([place])    ??? Hypertension      No past surgical history on file.  Social History     Socioeconomic History   ??? Marital status: SINGLE     Spouse name: Not on file   ??? Number of children: Not on file   ??? Years of education: Not on file   ??? Highest education level: Not on file   Tobacco Use   ??? Smoking status: Never Smoker   ??? Smokeless tobacco: Never Used   Substance and Sexual Activity   ??? Alcohol use: No     Alcohol/week: 0.0 oz   ??? Drug use: No   ??? Sexual activity: No     Family History   Problem Relation Age of Onset   ??? Diabetes Mother    ??? Arthritis-osteo Father    ??? COPD Sister    ??? Diabetes Brother    ??? Diabetes Brother      Current Outpatient Medications   Medication Sig Dispense Refill   ??? acetaminophen (TYLENOL) 325 mg tablet Take  by mouth every four (4) hours as needed for Pain.     ??? isosorbide dinitrate (ISORDIL) 10 mg tablet TAKE 3 TABLETS BY MOUTH THREE TIMES DAILY 270 Tab 0   ??? diclofenac (VOLTAREN) 1 % gel APPLY 4 GRAMS EXTERNALLY TO THE AFFECTED AREA THREE TIMES DAILY APPLY TO LOWER BACK AS WELL 100 g 0   ??? furosemide (LASIX) 80 mg tablet TAKE 1 TABLET BY MOUTH TWICE DAILY 180 Tab 1   ??? potassium chloride SR (KLOR-CON 10) 10 mEq tablet TAKE 1 TABLET BY MOUTH TWICE DAILY 180 Tab 1   ??? amLODIPine (NORVASC) 5 mg tablet Take 1 Tab by mouth daily. 90 Tab 1   ??? metoprolol succinate (TOPROL-XL) 25 mg XL tablet Take 3 Tabs by mouth  daily. 90 Tab 1   ??? spironolactone (ALDACTONE) 50 mg tablet Take 1 Tab by mouth daily. 90 Tab 1   ??? methocarbamol (ROBAXIN) 500 mg tablet Take 1 Tab by mouth every eight (8) hours as needed. 90 Tab 1   ??? pravastatin (PRAVACHOL) 20 mg tablet Take 1 Tab by mouth nightly. 90 Tab 1   ??? aspirin delayed-release 81 mg tablet Take 1 Tab by mouth daily. 90 Tab 1   ??? OXYGEN-AIR DELIVERY SYSTEMS Take 4 L/min by inhalation continuous.     ??? HYDROcodone-acetaminophen (NORCO) 5-325 mg per tablet Take 1 Tab by mouth every six (6) hours as needed. Max Daily Amount: 4 Tabs. 30 Tab 0   ??? cloNIDine HCl (CATAPRES) 0.1 mg tablet      ??? Nebulizer & Compressor machine 1 Each by Does Not Apply route daily. Obesity hypoventilation E66.2 1 Each 0   ??? Nebulizer Accessories kit Obesity Hypoventilation E66.2 1 Kit 0   ??? albuterol-ipratropium (DUO-NEB) 2.5 mg-0.5  mg/3 ml nebu 3 mL by Nebulization route every six (6) hours as needed. COPD  J 44.9 100 Nebule 1     Allergies   Allergen Reactions   ??? Lisinopril Angioedema       Objective:  Visit Vitals  BP 148/70   Pulse 82   Temp 96.5 ??F (35.8 ??C) (Oral)   Resp 20   Ht '5\' 6"'  (1.676 m)   Wt 261 lb (118.4 kg)   SpO2 90% Comment: 4 liters   BMI 42.13 kg/m??     Physical Exam:   General appearance - obese,alert, well appearing in no distress  Mental status - alert, oriented to person, place, and time  EYE-PERRL, EOMI  Neck - supple, no significant adenopathy   Chest - clear to auscultation, no wheezes, rales or rhonchi  Heart - normal rate, regular rhythm, normal blood pressure   Abdomen - soft, nontender, nondistended, no organomegaly  Ext-peripheral pulses normal, no pedal edema  Neuro -alert, oriented, normal speech, no focal findings  Back-full range of motion, no tenderness, palpable spasm or pain on motion     Results for orders placed or performed in visit on 04/30/17   LIPID PANEL   Result Value Ref Range    Cholesterol, total 256 (H) 100 - 199 mg/dL    Triglyceride 130 0 - 149 mg/dL     HDL Cholesterol 40 >39 mg/dL    VLDL, calculated 26 5 - 40 mg/dL    LDL, calculated 190 (H) 0 - 99 mg/dL    Comment Comment    HEMOGLOBIN A1C WITH EAG   Result Value Ref Range    Hemoglobin A1c 6.4 (H) 4.8 - 5.6 %    Estimated average glucose 137 mg/dL   CBC WITH AUTOMATED DIFF   Result Value Ref Range    WBC 5.3 3.4 - 10.8 x10E3/uL    RBC 4.97 3.77 - 5.28 x10E6/uL    HGB 14.1 11.1 - 15.9 g/dL    HCT 42.8 34.0 - 46.6 %    MCV 86 79 - 97 fL    MCH 28.4 26.6 - 33.0 pg    MCHC 32.9 31.5 - 35.7 g/dL    RDW 17.9 (H) 12.3 - 15.4 %    PLATELET 271 150 - 379 x10E3/uL    NEUTROPHILS 53 Not Estab. %    Lymphocytes 27 Not Estab. %    MONOCYTES 12 Not Estab. %    EOSINOPHILS 7 Not Estab. %    BASOPHILS 1 Not Estab. %    ABS. NEUTROPHILS 2.8 1.4 - 7.0 x10E3/uL    Abs Lymphocytes 1.4 0.7 - 3.1 x10E3/uL    ABS. MONOCYTES 0.7 0.1 - 0.9 x10E3/uL    ABS. EOSINOPHILS 0.4 0.0 - 0.4 x10E3/uL    ABS. BASOPHILS 0.0 0.0 - 0.2 x10E3/uL    IMMATURE GRANULOCYTES 0 Not Estab. %    ABS. IMM. GRANS. 0.0 0.0 - 0.1 [id number]/RC   METABOLIC PANEL, COMPREHENSIVE   Result Value Ref Range    Glucose 101 (H) 65 - 99 mg/dL    BUN 25 8 - 27 mg/dL    Creatinine 1.11 (H) 0.57 - 1.00 mg/dL    GFR est non-AA 51 (L) >59 mL/min/1.73    GFR est AA 59 (L) >59 mL/min/1.73    BUN/Creatinine ratio 23 12 - 28    Sodium 144 134 - 144 mmol/L    Potassium 4.3 3.5 - 5.2 mmol/L    Chloride 99 96 - 106 mmol/L  CO2 25 20 - 29 mmol/L    Calcium 9.7 8.7 - 10.3 mg/dL    Protein, total 7.5 6.0 - 8.5 g/dL    Albumin 4.3 3.6 - 4.8 g/dL    GLOBULIN, TOTAL 3.2 1.5 - 4.5 g/dL    A-G Ratio 1.3 1.2 - 2.2    Bilirubin, total 0.6 0.0 - 1.2 mg/dL    Alk. phosphatase 67 39 - 117 IU/L    AST (SGOT) 10 0 - 40 IU/L    ALT (SGPT) 4 0 - 32 IU/L   VITAMIN D, 25 HYDROXY   Result Value Ref Range    VITAMIN D, 25-HYDROXY 38.0 30.0 - 100.0 ng/mL   URINALYSIS W/ RFLX MICROSCOPIC   Result Value Ref Range    Specific Gravity CANCELED     pH (UA) CANCELED     Protein CANCELED     Glucose CANCELED      Ketone CANCELED    MICROALBUMIN, UR, RAND W/ MICROALB/CREAT RATIO   Result Value Ref Range    Creatinine, urine 219.9 Not Estab. mg/dL    Microalbumin, urine 51.8 Not Estab. ug/mL    Microalb/Creat ratio (ug/mg creat.) 23.6 0.0 - 30.0 mg/g creat   CKD REPORT   Result Value Ref Range    Interpretation Note    DIABETES PATIENT EDUCATION   Result Value Ref Range    PDF Image Not applicable      Assessment/Plan:  Diagnoses and all orders for this visit:    Encounter for screening mammogram for breast cancer  -     MAM MAMMO BI SCREENING INCL CAD; Future    Lumbar degenerative disc disease  -     HYDROcodone-acetaminophen (NORCO) 5-325 mg per tablet; Take 1 Tab by mouth every eight (8) hours as needed for Pain for up to 30 days. Max Daily Amount: 3 Tabs., Print, Disp-30 Tab, R-0  -     diclofenac (VOLTAREN) 1 % gel; APPLY 4 GRAMS EXTERNALLY TO THE AFFECTED AREA THREE TIMES DAILY APPLY TO LOWER BACK AS WELL, Normal, Disp-100 g, R-0    Chronic bilateral low back pain without sciatica  -     HYDROcodone-acetaminophen (NORCO) 5-325 mg per tablet; Take 1 Tab by mouth every eight (8) hours as needed for Pain for up to 30 days. Max Daily Amount: 3 Tabs., Print, Disp-30 Tab, R-0  -     diclofenac (VOLTAREN) 1 % gel; APPLY 4 GRAMS EXTERNALLY TO THE AFFECTED AREA THREE TIMES DAILY APPLY TO LOWER BACK AS WELL, Normal, Disp-100 g, R-0    Hypertension, well controlled  -     METABOLIC PANEL, COMPREHENSIVE    Hypercholesterolemia  -     LIPID PANEL    Encounter for diabetic foot exam ([place])  -     HM DIABETES FOOT EXAM      Patient Instructions        Oxygen Therapy: Care Instructions  Your Care Instructions    Oxygen therapy helps you get more oxygen into your lungs and bloodstream. You may use it if you have a disease that makes it hard to breathe, such as COPD, pulmonary fibrosis (scarring of the lungs), or heart failure. Oxygen therapy can make it easier for you to breathe and can reduce your heart's workload.   Some people need extra oxygen all the time. Others need it from time to time throughout the day or overnight. A doctor will prescribe how much oxygen you need and how often to use it.  To breathe the oxygen, most people use a nasal cannula (say "KAN-yuh-luh"). This is a thin tube with two prongs that fit just inside your nose. People who need a lot of oxygen may need to use a mask that fits over the nose and mouth.  Follow-up care is a key part of your treatment and safety. Be sure to make and go to all appointments, and call your doctor if you are having problems. It's also a good idea to know your test results and keep a list of the medicines you take.  How can you care for yourself at home?  To help yourself  ?? Using oxygen may dry out your nose or lips. Use water-based lubricants on your lips or nostrils. Do not use an oil-based product like petroleum jelly.  ?? If you use a nasal cannula, the tubing may rub under your nostrils and around your ears. To keep your skin from getting sore, tuck some gauze under the tubing. Use a water-based lotion on rubbed areas.  ?? Do not use alcohol or take drugs that relax you, because they will slow your breathing rate.  ?? Keep track of how much oxygen is in the tank, and reorder before it runs out. If a holiday is coming up or you expect bad weather, order in advance or make your regular order larger.  ?? You may need extra oxygen when you travel to high altitudes or travel by plane. Ask your doctor about this.  ?? If you are getting oxygen directly to your windpipe through an opening in your neck, your doctor will teach you how to care for the equipment.  To make sure oxygen is flowing  ?? Check the flow by holding your mask or cannula up to your ear and listening for the "hiss" of airflow.  ?? If you have a nasal cannula, dip the prongs in a glass of water. If you see bubbles, oxygen is coming through.  ?? Check your pressure gauge or contents indicator.   ?? If you use an oxygen concentrator, make sure it is turned on and plugged in. If you use a cylinder, make sure the valve is open.  ?? Look for kinks, blockages, or water in the tubing. Be sure the tubing is connected to the oxygen source.  ?? Do not change your oxygen flow rate. Your doctor sets this at the correct level. Higher flow rates usually do not help and can increase the risk of harmful carbon dioxide buildup in the blood.  To be safe  ?? Do not leave cords or tubing running across an area where you or someone else may trip on it.  ?? Do not let oxygen containers get hot. Store them in a cool place where there is airflow. Do not leave them in a car trunk or a hot vehicle.  ?? Keep oxygen containers upright. Make sure they do not fall over and get damaged. Try securing the tanks in a sturdy container or securing them with a rope or a chain.  ?? Watch for signs of oxygen leaks. If you hear a loud hissing from your container or if it empties too fast, stay away from the container. Open windows right away and call the company that brought the oxygen system to your home.  ?? Do not use oxygen around anything that could spark or easily cause a fire.  ? Do not smoke or let others smoke while you are using oxygen. Put up "no smoking" signs in  your home.  ? Do not use oxygen near open flames, such as candles, fireplaces, gas stoves, or hot water heaters. Do not use it near electric razors, hair dryers, heating pads, or anything that may spark.  ? Keep a working Data processing manager in your home where it is easy to get to.  ? If a fire starts, turn off the oxygen right away and leave the house.  ? If you have an oxygen concentrator, do not use it if the cord looks damaged. Do not use an extension cord to plug it in. Do not plug it into an outlet that has other appliances plugged into it.  To care for the equipment  ?? Follow the directions that come with the equipment for using and caring for it.   ?? Wash your cannula or mask with a liquid soap and warm water 1 or 2 times a week. Replace them every 2 to 4 weeks.  ?? If you have a cold, change the nasal prongs when your cold symptoms are done.  ?? If you have an oxygen concentrator, unplug the unit and wipe down the cabinet with a damp cloth daily. Clean the air filter at least 2 times a week.  Where can you learn more?  Go to [employer].  Enter [phone] in the search box to learn more about "Oxygen Therapy: Care Instructions."  Current as of: February 04, 2017  Content Version: 11.9  ?? 2006-2018 [employer]. Care instructions adapted under license by [employer] (which disclaims liability or warranty for this information). If you have questions about a medical condition or this instruction, always ask your healthcare professional. [place] any warranty or liability for your use of this information.        Follow-up Disposition:  Return in about 3 months (around 10/27/2017), or if symptoms worsen or fail to improve, for routine follow up.

## 2017-07-30 NOTE — Patient Instructions (Addendum)
Oxygen Therapy: Care Instructions  Your Care Instructions    Oxygen therapy helps you get more oxygen into your lungs and bloodstream. You may use it if you have a disease that makes it hard to breathe, such as COPD, pulmonary fibrosis (scarring of the lungs), or heart failure. Oxygen therapy can make it easier for you to breathe and can reduce your heart's workload.  Some people need extra oxygen all the time. Others need it from time to time throughout the day or overnight. A doctor will prescribe how much oxygen you need and how often to use it.  To breathe the oxygen, most people use a nasal cannula (say "KAN-yuh-luh"). This is a thin tube with two prongs that fit just inside your nose. People who need a lot of oxygen may need to use a mask that fits over the nose and mouth.  Follow-up care is a key part of your treatment and safety. Be sure to make and go to all appointments, and call your doctor if you are having problems. It's also a good idea to know your test results and keep a list of the medicines you take.  How can you care for yourself at home?  To help yourself  ?? Using oxygen may dry out your nose or lips. Use water-based lubricants on your lips or nostrils. Do not use an oil-based product like petroleum jelly.  ?? If you use a nasal cannula, the tubing may rub under your nostrils and around your ears. To keep your skin from getting sore, tuck some gauze under the tubing. Use a water-based lotion on rubbed areas.  ?? Do not use alcohol or take drugs that relax you, because they will slow your breathing rate.  ?? Keep track of how much oxygen is in the tank, and reorder before it runs out. If a holiday is coming up or you expect bad weather, order in advance or make your regular order larger.  ?? You may need extra oxygen when you travel to high altitudes or travel by plane. Ask your doctor about this.  ?? If you are getting oxygen directly to your windpipe through an opening  in your neck, your doctor will teach you how to care for the equipment.  To make sure oxygen is flowing  ?? Check the flow by holding your mask or cannula up to your ear and listening for the "hiss" of airflow.  ?? If you have a nasal cannula, dip the prongs in a glass of water. If you see bubbles, oxygen is coming through.  ?? Check your pressure gauge or contents indicator.  ?? If you use an oxygen concentrator, make sure it is turned on and plugged in. If you use a cylinder, make sure the valve is open.  ?? Look for kinks, blockages, or water in the tubing. Be sure the tubing is connected to the oxygen source.  ?? Do not change your oxygen flow rate. Your doctor sets this at the correct level. Higher flow rates usually do not help and can increase the risk of harmful carbon dioxide buildup in the blood.  To be safe  ?? Do not leave cords or tubing running across an area where you or someone else may trip on it.  ?? Do not let oxygen containers get hot. Store them in a cool place where there is airflow. Do not leave them in a car trunk or a hot vehicle.  ?? Keep oxygen containers upright. Make sure they do   not fall over and get damaged. Try securing the tanks in a sturdy container or securing them with a rope or a chain.  ?? Watch for signs of oxygen leaks. If you hear a loud hissing from your container or if it empties too fast, stay away from the container. Open windows right away and call the company that brought the oxygen system to your home.  ?? Do not use oxygen around anything that could spark or easily cause a fire.  ? Do not smoke or let others smoke while you are using oxygen. Put up "no smoking" signs in your home.  ? Do not use oxygen near open flames, such as candles, fireplaces, gas stoves, or hot water heaters. Do not use it near electric razors, hair dryers, heating pads, or anything that may spark.  ? Keep a working fire extinguisher in your home where it is easy to get to.   ? If a fire starts, turn off the oxygen right away and leave the house.  ? If you have an oxygen concentrator, do not use it if the cord looks damaged. Do not use an extension cord to plug it in. Do not plug it into an outlet that has other appliances plugged into it.  To care for the equipment  ?? Follow the directions that come with the equipment for using and caring for it.  ?? Wash your cannula or mask with a liquid soap and warm water 1 or 2 times a week. Replace them every 2 to 4 weeks.  ?? If you have a cold, change the nasal prongs when your cold symptoms are done.  ?? If you have an oxygen concentrator, unplug the unit and wipe down the cabinet with a damp cloth daily. Clean the air filter at least 2 times a week.  Where can you learn more?  Go to http://www.healthwise.net/[employer].  Enter E117 in the search box to learn more about "Oxygen Therapy: Care Instructions."  Current as of: February 04, 2017  Content Version: 11.9  ?? 2006-2018 [employer]. Care instructions adapted under license by [employer] (which disclaims liability or warranty for this information). If you have questions about a medical condition or this instruction, always ask your healthcare professional. [employer] disclaims any warranty or liability for your use of this information.

## 2017-08-07 MED ORDER — ISOSORBIDE DINITRATE 10 MG TAB
10 mg | ORAL_TABLET | ORAL | 0 refills | Status: DC
Start: 2017-08-07 — End: 2017-09-04

## 2017-08-10 ENCOUNTER — Inpatient Hospital Stay: Admit: 2017-08-10 | Payer: MEDICARE | Attending: Internal Medicine | Primary: Internal Medicine

## 2017-08-10 DIAGNOSIS — Z1231 Encounter for screening mammogram for malignant neoplasm of breast: Secondary | ICD-10-CM

## 2017-08-12 MED ORDER — FUROSEMIDE 80 MG TAB
80 mg | ORAL_TABLET | ORAL | 0 refills | Status: DC
Start: 2017-08-12 — End: 2017-08-23

## 2017-08-12 MED ORDER — METOPROLOL SUCCINATE SR 50 MG 24 HR TAB
50 mg | ORAL_TABLET | ORAL | 0 refills | Status: AC
Start: 2017-08-12 — End: ?

## 2017-08-13 ENCOUNTER — Encounter

## 2017-08-13 MED ORDER — AMLODIPINE 10 MG TAB
10 mg | ORAL_TABLET | ORAL | 0 refills | Status: DC
Start: 2017-08-13 — End: 2017-08-18

## 2017-08-17 ENCOUNTER — Encounter

## 2017-08-17 NOTE — Telephone Encounter (Signed)
Called patient to inform the correct dose of the amlodipine (per order 10mg  tablet daily)

## 2017-08-18 MED ORDER — CHOLECALCIFEROL (VITAMIN D3) 1,000 UNIT (25 MCG) TAB
ORAL_TABLET | Freq: Every day | ORAL | 3 refills | Status: AC
Start: 2017-08-18 — End: ?

## 2017-08-18 MED ORDER — AMLODIPINE 5 MG TAB
5 mg | ORAL_TABLET | Freq: Every day | ORAL | 5 refills | Status: DC
Start: 2017-08-18 — End: 2017-10-21

## 2017-08-22 ENCOUNTER — Encounter

## 2017-08-23 MED ORDER — FUROSEMIDE 40 MG TAB
40 mg | ORAL_TABLET | ORAL | 0 refills | Status: DC
Start: 2017-08-23 — End: 2017-10-21

## 2017-08-26 ENCOUNTER — Encounter

## 2017-08-27 MED ORDER — BLOOD SUGAR DIAGNOSTIC TEST STRIPS
ORAL_STRIP | 1 refills | Status: DC
Start: 2017-08-27 — End: 2017-10-21

## 2017-09-01 NOTE — Telephone Encounter (Signed)
Pharmacy sent over request for prescription refill for Furosemide 40mg .     Last fill:  Last appt:  Next appt:

## 2017-09-04 MED ORDER — ISOSORBIDE DINITRATE 10 MG TAB
10 mg | ORAL_TABLET | ORAL | 0 refills | Status: DC
Start: 2017-09-04 — End: 2017-10-01

## 2017-09-11 ENCOUNTER — Ambulatory Visit
Admit: 2017-09-11 | Discharge: 2017-09-11 | Payer: PRIVATE HEALTH INSURANCE | Attending: Cardiovascular Disease | Primary: Internal Medicine

## 2017-09-11 DIAGNOSIS — I5033 Acute on chronic diastolic (congestive) heart failure: Secondary | ICD-10-CM

## 2017-09-11 NOTE — Progress Notes (Signed)
1. Have you been to the ER, urgent care clinic since your last visit?  Hospitalized since your last visit? Yes 11/2016 then rehab til 04/2017 due to breathing and back issues.    2. Have you seen or consulted any other health care providers outside of the [facility] since your last visit?  Include any pap smears or colon screening.  Feb 06/2017 Dr Marcos EkeGonzales.    Chief Complaint   Patient presents with   ??? Follow-up     Acute on chronic diastolic (congestive) heart failure (HCC)     Patient C/O SOB with activity has  Oxygen @ 4 Liters

## 2017-09-11 NOTE — Progress Notes (Signed)
Garret Reddish DNP, ANP-BC  Subjective/HPI:     Brittney Shelton is a 67 y.o. female is here for routine f/u.  Patient was admitted to [facility] October 2018 for acute respiratory failure, fluid overload.  Discharge from hospital to rehab and then discharged from rehab home around Thanksgiving.  She has remained on diuretics without any rehospitalization.  She reports her breathing is her baseline, requires 4 L of nasal cannula oxygen.  Denies orthopnea or paroxysmal nocturnal dyspnea.        ECHO 2018  SUMMARY:  Left ventricle: The cavity was small. Systolic function was normal.  Ejection fraction was estimated in the range of 60 % to 65 %. There were  no regional wall motion abnormalities.    Tricuspid valve: There was mild regurgitation. Pulmonary artery systolic  pressure: 60 mmHg. There was moderate pulmonary hypertension.    INDICATIONS: Congestive heart failure  PCP Provider  Ermalene Postin, MD  Past Medical History:   Diagnosis Date   ??? Chronic obstructive pulmonary disease ([place])    ??? Congestive heart failure ([place])    ??? Diabetes ([place])    ??? Hypertension       History reviewed. No pertinent surgical history.  Allergies   Allergen Reactions   ??? Lisinopril Angioedema      Family History   Problem Relation Age of Onset   ??? Diabetes Mother    ??? Arthritis-osteo Father    ??? COPD Sister    ??? Diabetes Brother    ??? Diabetes Brother       Current Outpatient Medications   Medication Sig   ??? isosorbide dinitrate (ISORDIL) 10 mg tablet TAKE 3 TABLETS BY MOUTH THREE TIMES DAILY   ??? glucose blood VI test strips (EMBRACE EVO TEST STRIPS) strip Check blood sugar 2 times a day   ??? furosemide (LASIX) 40 mg tablet TAKE 1 TABLET BY MOUTH TWICE DAILY FOR SWELLING   ??? cholecalciferol (VITAMIN D3) 1,000 unit tablet Take 1 Tab by mouth daily.   ??? amLODIPine (NORVASC) 5 mg tablet Take 1 Tab by mouth daily.   ??? metoprolol succinate (TOPROL-XL) 50 mg XL tablet TAKE 1 AND 1/2 TABLET BY MOUTH EVERY DAY    ??? diclofenac (VOLTAREN) 1 % gel APPLY 4 GRAMS EXTERNALLY TO THE AFFECTED AREA THREE TIMES DAILY APPLY TO LOWER BACK AS WELL   ??? potassium chloride SR (KLOR-CON 10) 10 mEq tablet TAKE 1 TABLET BY MOUTH TWICE DAILY   ??? spironolactone (ALDACTONE) 50 mg tablet Take 1 Tab by mouth daily.   ??? methocarbamol (ROBAXIN) 500 mg tablet Take 1 Tab by mouth every eight (8) hours as needed.   ??? pravastatin (PRAVACHOL) 20 mg tablet Take 1 Tab by mouth nightly.   ??? aspirin delayed-release 81 mg tablet Take 1 Tab by mouth daily.   ??? OXYGEN-AIR DELIVERY SYSTEMS Take 4 L/min by inhalation continuous.   ??? cloNIDine HCl (CATAPRES) 0.1 mg tablet Take 0.1 mg by mouth daily.   ??? Nebulizer & Compressor machine 1 Each by Does Not Apply route daily. Obesity hypoventilation E66.2   ??? Nebulizer Accessories kit Obesity Hypoventilation E66.2   ??? albuterol-ipratropium (DUO-NEB) 2.5 mg-0.5 mg/3 ml nebu 3 mL by Nebulization route every six (6) hours as needed. COPD  J 44.9     No current facility-administered medications for this visit.       Vitals:    09/11/17 1324 09/11/17 1325   BP: 100/60 120/70   Pulse: 70  Resp: 20    SpO2: 91%    Weight: 262 lb 11.2 oz (119.2 kg)    Height: 5\' 6"  (1.676 m)      Social History     Socioeconomic History   ??? Marital status: SINGLE     Spouse name: Not on file   ??? Number of children: Not on file   ??? Years of education: Not on file   ??? Highest education level: Not on file   Occupational History   ??? Not on file   Social Needs   ??? Financial resource strain: Not on file   ??? Food insecurity:     Worry: Not on file     Inability: Not on file   ??? Transportation needs:     Medical: Not on file     Non-medical: Not on file   Tobacco Use   ??? Smoking status: Never Smoker   ??? Smokeless tobacco: Never Used   Substance and Sexual Activity   ??? Alcohol use: No     Alcohol/week: 0.0 oz   ??? Drug use: No   ??? Sexual activity: Never   Lifestyle   ??? Physical activity:     Days per week: Not on file      Minutes per session: Not on file   ??? Stress: Not on file   Relationships   ??? Social connections:     Talks on phone: Not on file     Gets together: Not on file     Attends religious service: Not on file     Active member of club or organization: Not on file     Attends meetings of clubs or organizations: Not on file     Relationship status: Not on file   ??? Intimate partner violence:     Fear of current or ex partner: Not on file     Emotionally abused: Not on file     Physically abused: Not on file     Forced sexual activity: Not on file   Other Topics Concern   ??? Not on file   Social History Narrative   ??? Not on file       I have reviewed the nurses notes, vitals, problem list, allergy list, medical history, family, social history and medications.    Review of Symptoms:    General: Pt denies excessive weight gain or loss. Pt is able to conduct ADL's  HEENT: Denies blurred vision, headaches, epistaxis and difficulty swallowing.  Respiratory: Denies shortness of breath, + intermittent DOE, denies wheezing or stridor.  Cardiovascular: Denies precordial pain, palpitations, nonpitting bilateral lower extremity edema denies PND  Gastrointestinal: Denies poor appetite, indigestion, abdominal pain or blood in stool  Musculoskeletal: Has diffuse musculoskeletal discomfort, using walker for ambulation  Neurologic: Denies tremor, paresthesias, or sensory motor disturbance  Skin: Denies rash, itching or texture change.      Physical Exam: ??    General: Well developed, in no acute distress, cooperative and alert  HEENT: No carotid bruits, no JVD, trach is midline. Neck Supple, PEERL, EOM intact.  Heart: ??Normal S1/S2 negative S3 or S4. Regular, no murmur, gallop or rub.??  Respiratory: Diminished bilaterally at the bases, no wheezing rales or rhonchi  Abdomen:?? ??Soft, non-tender, no masses, bowel sounds are active.??  Extremities: Bi- lateral nonpitting dependent edema left greater than  right, normal cap refill, no cyanosis, atraumatic.   Neuro: A&Ox3, speech clear, gait stable walker  Skin: Skin color is normal. No rashes  or lesions. Non diaphoretic  Vascular: 2+ pulses symmetric in bilateral radial pulses    Cardiographics    ECG: Sinus rhythm  Results for orders placed or performed during the hospital encounter of 03/11/17   EKG, 12 LEAD, INITIAL   Result Value Ref Range    Ventricular Rate 86 BPM    Atrial Rate 86 BPM    P-R Interval 184 ms    QRS Duration 82 ms    Q-T Interval 360 ms    QTC Calculation (Bezet) 430 ms    Calculated P Axis 56 degrees    Calculated R Axis 97 degrees    Calculated T Axis 22 degrees    Diagnosis       Normal sinus rhythm  Right atrial enlargement    When compared with ECG of 12-Mar-2017 09:56,  Sinus rhythm has replaced Atrial fibrillation    Confirmed by Comer Locket [phone]) on 03/20/2017 9:28:55 AM           Cardiology Labs:  Lab Results   Component Value Date/Time    Cholesterol, total 256 (H) 05/18/2017 01:50 PM    HDL Cholesterol 40 05/18/2017 01:50 PM    LDL, calculated 190 (H) 05/18/2017 01:50 PM    Triglyceride 130 05/18/2017 01:50 PM       Lab Results   Component Value Date/Time    Sodium 144 05/18/2017 01:50 PM    Potassium 4.3 05/18/2017 01:50 PM    Chloride 99 05/18/2017 01:50 PM    CO2 25 05/18/2017 01:50 PM    Anion gap 7 04/03/2017 03:36 AM    Glucose 101 (H) 05/18/2017 01:50 PM    BUN 25 05/18/2017 01:50 PM    Creatinine 1.11 (H) 05/18/2017 01:50 PM    BUN/Creatinine ratio 23 05/18/2017 01:50 PM    GFR est AA 59 (L) 05/18/2017 01:50 PM    GFR est non-AA 51 (L) 05/18/2017 01:50 PM    Calcium 9.7 05/18/2017 01:50 PM    Bilirubin, total 0.6 05/18/2017 01:50 PM    AST (SGOT) 10 05/18/2017 01:50 PM    Alk. phosphatase 67 05/18/2017 01:50 PM    Protein, total 7.5 05/18/2017 01:50 PM    Albumin 4.3 05/18/2017 01:50 PM    Globulin 4.5 (H) 04/01/2017 05:27 AM    A-G Ratio 1.3 05/18/2017 01:50 PM    ALT (SGPT) 4 05/18/2017 01:50 PM           Assessment:      Assessment:     Diagnoses and all orders for this visit:    1. Acute on chronic diastolic (congestive) heart failure (HCC)  -     AMB POC EKG ROUTINE W/ 12 LEADS, INTER & REP    2. Type 2 diabetes with nephropathy ([place])    3. Pulmonary emphysema, unspecified emphysema type ([place])    4. Requires continuous at home supplemental oxygen        ICD-10-CM ICD-9-CM    1. Acute on chronic diastolic (congestive) heart failure (HCC) I50.33 428.33 AMB POC EKG ROUTINE W/ 12 LEADS, INTER & REP     428.0    2. Type 2 diabetes with nephropathy (HCC) E11.21 250.40      583.81    3. Pulmonary emphysema, unspecified emphysema type ([place]) J43.9 492.8    4. Requires continuous at home supplemental oxygen Z99.81 V46.2      Orders Placed This Encounter   ??? AMB POC EKG ROUTINE W/ 12 LEADS, INTER & REP  Order Specific Question:   Reason for Exam:     Answer:   routine        Plan:     1.  Diastolic heart failure: Clinically compensated presents euvolemic weight stable continue current diuretics has labs pending from primary care for repeat renal function  Reinforced low-sodium diet, continue diuretics.  Follow-up with cardiology in 6 months    Dianne Dun, MD    This note was created using voice recognition software. Despite editing, there may be syntax errors.

## 2017-09-21 NOTE — Progress Notes (Signed)
Message received from patient requesting call back.Unable to leave message due to mailbox being full.    1442 Patient is reporting she is going on a cruise in June and has contacted [employer] to request a lighter portable oxygen tank. She was told her provider will need to send in a new prescription and then someone will come out and evaluate which weight she needs. Writer advised will send message to nurse/provider and they will call with any questions. Patient given an opportunity to ask questions, repeated information, and verbalized understanding.

## 2017-10-01 MED ORDER — ISOSORBIDE DINITRATE 10 MG TAB
10 mg | ORAL_TABLET | ORAL | 0 refills | Status: DC
Start: 2017-10-01 — End: 2017-11-02

## 2017-10-19 NOTE — Progress Notes (Signed)
Received message from patient requesting a call back ASAP. Writer spoke with patient and she states she has spoken with PCP office and will be seen tomorrow, 10/20/17, for right hand swelling and increase in SOB X 1 week. Patient does not drive and depends on family for transportation. Advised if she feels she needs to be seen by a provider today to contact [facility]. Verified patient has contact information.  Patient given an opportunity to ask questions, repeated information, and verbalized understanding.

## 2017-10-21 ENCOUNTER — Inpatient Hospital Stay
Admit: 2017-10-21 | Discharge: 2017-11-30 | Disposition: E | Payer: MEDICARE | Attending: Internal Medicine | Admitting: Internal Medicine

## 2017-10-21 ENCOUNTER — Emergency Department: Admit: 2017-10-21 | Payer: MEDICARE | Primary: Internal Medicine

## 2017-10-21 ENCOUNTER — Ambulatory Visit
Admit: 2017-10-21 | Discharge: 2017-10-21 | Payer: PRIVATE HEALTH INSURANCE | Attending: Internal Medicine | Primary: Internal Medicine

## 2017-10-21 DIAGNOSIS — I110 Hypertensive heart disease with heart failure: Secondary | ICD-10-CM

## 2017-10-21 DIAGNOSIS — J9621 Acute and chronic respiratory failure with hypoxia: Secondary | ICD-10-CM

## 2017-10-21 LAB — METABOLIC PANEL, COMPREHENSIVE
A-G Ratio: 0.7 — ABNORMAL LOW (ref 1.1–2.2)
ALT (SGPT): 40 U/L (ref 12–78)
AST (SGOT): 21 U/L (ref 15–37)
Albumin: 3.5 g/dL (ref 3.5–5.0)
Alk. phosphatase: 118 U/L — ABNORMAL HIGH (ref 45–117)
Anion gap: 8 mmol/L (ref 5–15)
BUN/Creatinine ratio: 34 — ABNORMAL HIGH (ref 12–20)
BUN: 55 MG/DL — ABNORMAL HIGH (ref 6–20)
Bilirubin, total: 1.1 MG/DL — ABNORMAL HIGH (ref 0.2–1.0)
CO2: 24 mmol/L (ref 21–32)
Calcium: 8.1 MG/DL — ABNORMAL LOW (ref 8.5–10.1)
Chloride: 105 mmol/L (ref 97–108)
Creatinine: 1.61 MG/DL — ABNORMAL HIGH (ref 0.55–1.02)
GFR est AA: 39 mL/min/{1.73_m2} — ABNORMAL LOW (ref >60)
GFR est non-AA: 32 mL/min/{1.73_m2} — ABNORMAL LOW (ref >60)
Globulin: 4.8 g/dL — ABNORMAL HIGH (ref 2.0–4.0)
Glucose: 119 mg/dL — ABNORMAL HIGH (ref 65–100)
Potassium: 5 mmol/L (ref 3.5–5.1)
Protein, total: 8.3 g/dL — ABNORMAL HIGH (ref 6.4–8.2)
Sodium: 137 mmol/L (ref 136–145)

## 2017-10-21 LAB — POC G3 - PUL
Base deficit (POC): 4 mmol/L
Flow rate (POC): 15 L/min
HCO3 (POC): 22.2 MMOL/L (ref 22–26)
Total resp. rate: 19
pCO2 (POC): 46.9 MMHG — ABNORMAL HIGH (ref 35.0–45.0)
pH (POC): 7.284 — ABNORMAL LOW (ref 7.35–7.45)
pO2 (POC): 83 MMHG (ref 80–100)
sO2 (POC): 95 % (ref 92–97)

## 2017-10-21 LAB — CBC WITH AUTOMATED DIFF
ABS. BASOPHILS: 0 10*3/uL (ref 0.0–0.1)
ABS. EOSINOPHILS: 0.1 10*3/uL (ref 0.0–0.4)
ABS. IMM. GRANS.: 0.1 10*3/uL — ABNORMAL HIGH (ref 0.00–0.04)
ABS. LYMPHOCYTES: 1.1 10*3/uL (ref 0.8–3.5)
ABS. MONOCYTES: 0.9 10*3/uL (ref 0.0–1.0)
ABS. NEUTROPHILS: 6.2 10*3/uL (ref 1.8–8.0)
ABSOLUTE NRBC: 0.07 10*3/uL — ABNORMAL HIGH (ref 0.00–0.01)
BASOPHILS: 0 % (ref 0–1)
EOSINOPHILS: 1 % (ref 0–7)
HCT: 41.8 % (ref 35.0–47.0)
HGB: 12.7 g/dL (ref 11.5–16.0)
IMMATURE GRANULOCYTES: 1 % — ABNORMAL HIGH (ref 0.0–0.5)
LYMPHOCYTES: 13 % (ref 12–49)
MCH: 28 PG (ref 26.0–34.0)
MCHC: 30.4 g/dL (ref 30.0–36.5)
MCV: 92.3 FL (ref 80.0–99.0)
MONOCYTES: 11 % (ref 5–13)
MPV: 11.9 FL (ref 8.9–12.9)
NEUTROPHILS: 74 % (ref 32–75)
NRBC: 0.8 PER 100 WBC — ABNORMAL HIGH
PLATELET: 310 10*3/uL (ref 150–400)
RBC: 4.53 M/uL (ref 3.80–5.20)
RDW: 18.4 % — ABNORMAL HIGH (ref 11.5–14.5)
WBC: 8.4 10*3/uL (ref 3.6–11.0)

## 2017-10-21 LAB — D DIMER: D-dimer: 1.55 mg/L FEU — ABNORMAL HIGH (ref 0.00–0.65)

## 2017-10-21 LAB — MAGNESIUM: Magnesium: 2.5 mg/dL — ABNORMAL HIGH (ref 1.6–2.4)

## 2017-10-21 LAB — TROPONIN I: Troponin-I, Qt.: <0.05 ng/mL (ref <0.05)

## 2017-10-21 LAB — NT-PRO BNP: NT pro-BNP: 16372 PG/ML — ABNORMAL HIGH (ref <125)

## 2017-10-21 LAB — CK W/ REFLX CKMB: CK: 50 U/L (ref 26–192)

## 2017-10-21 MED ORDER — IPRATROPIUM-ALBUTEROL 2.5 MG-0.5 MG/3 ML NEB SOLUTION
2.5 mg-0.5 mg/3 ml | RESPIRATORY_TRACT | Status: AC
Start: 2017-10-21 — End: 2017-10-21
  Administered 2017-10-21: 20:00:00 via RESPIRATORY_TRACT

## 2017-10-21 MED ORDER — SODIUM CHLORIDE 0.9 % IJ SYRG
Freq: Once | INTRAMUSCULAR | Status: AC
Start: 2017-10-21 — End: 2017-10-21
  Administered 2017-10-21: 23:00:00 via INTRAVENOUS

## 2017-10-21 MED ORDER — FUROSEMIDE 10 MG/ML IJ SOLN
10 mg/mL | INTRAMUSCULAR | Status: AC
Start: 2017-10-21 — End: 2017-10-21
  Administered 2017-10-21: 21:00:00 via INTRAVENOUS

## 2017-10-21 MED ORDER — IOPAMIDOL 76 % IV SOLN
370 mg iodine /mL (76 %) | Freq: Once | INTRAVENOUS | Status: AC
Start: 2017-10-21 — End: 2017-10-21
  Administered 2017-10-21: 23:00:00 via INTRAVENOUS

## 2017-10-21 MED ORDER — SODIUM CHLORIDE 0.9 % IV
Freq: Once | INTRAVENOUS | Status: DC
Start: 2017-10-21 — End: 2017-10-21
  Administered 2017-10-21: 23:00:00 via INTRAVENOUS

## 2017-10-21 MED FILL — FUROSEMIDE 10 MG/ML IJ SOLN: 10 mg/mL | INTRAMUSCULAR | Qty: 4

## 2017-10-21 MED FILL — SODIUM CHLORIDE 0.9 % IV: INTRAVENOUS | Qty: 1000

## 2017-10-21 MED FILL — BD POSIFLUSH NORMAL SALINE 0.9 % INJECTION SYRINGE: INTRAMUSCULAR | Qty: 10

## 2017-10-21 MED FILL — IPRATROPIUM-ALBUTEROL 2.5 MG-0.5 MG/3 ML NEB SOLUTION: 2.5 mg-0.5 mg/3 ml | RESPIRATORY_TRACT | Qty: 3

## 2017-10-21 MED FILL — ISOVUE-370  76 % INTRAVENOUS SOLUTION: 370 mg iodine /mL (76 %) | INTRAVENOUS | Qty: 100

## 2017-10-21 NOTE — H&P (Signed)
Hospitalist Admission Note    NAME:  Brittney Shelton   DOB:   11-13-1950   MRN:   [id number]     Date of admit: 10/24/2017    PCP: Ermalene Postin, MD    Assessment/Plan:     Acute on chronic respiratory failure with hypoxia POA  Acute on chronic diastolic CHF POA  ? Right lower lobe pneumonia, ? Aspiration POA  ? COPD exacerbation POA  Worsening DOE and o2 requirements past week  RR to 24 in ED, ABG on 100% oxygen pH 7.28, PCO2 47, PO2 83  Now on 100% O2  Not wheezing, not sure this is acute COPD  No PE on CTA  pBNP increased 13,372  Stepdown  PRN BIPAP  Consult pulmonary and cardiology  IV levaquin and flagyl in case this is aspiration pneumonia  IV lasix  IV steroids and nebs  Check echo    Acute kidney injury POA BUN/creat 55 and 1.61  Prior baseline creat 0.9 to 1.1  ? Poor forward flow with CHF  Renal US  IV diuretics  Watch renal function    Essential HTN POA  BP borderline low  Hold norvasc and ISDN  Reduce dose toprol XL, continue lasix and aldactone  Adjust as needed    DM type 2 POA  Diet controlled  Last HgBa1c 6.4 in Dec 2018  POC, SSI  Watch on steroids    Right pleural effusion POA    Remote tobacco use quit 20 years ago    Morbid Obesity POA Body mass index is 42.61 kg/m??.    Given the patient's current clinical presentation, I have a high level of concern for decompensation if discharged from the emergency department.  My assessment of this patient's clinical condition and my plan of care is as noted above.    DVT prophylaxis with heparin SQ    Code status: full code  QIO:[id number]    History     CHIEF COMPLAINT: "I have been SOB the past week and cannot get my oxygen levels up"    HISTORY OF PRESENT ILLNESS:    67 year old African-American female  Currently lives with her brother, she divorced, no children  Baseline uses 4 L nasal cannula oxygen due to underlying COPD and CHF    Past week been "feeling bad", could not keep her oxygen sats up on her baseline oxygen   More short of breath when she exerts herself,  now even short of breath at rest  Walking 10 to 15 feet to the bathroom she has to stop and rest to catch her breath  More short of breath laying flat but was not clear that she feels better when she sits up  Intermittent ankle edema "comes and goes"  No weight gain  Episode of nausea vomiting approximately 1 week ago while she was in bed       no coughing or choking at the time  Since that time she has had increasing dry cough over the past week  No chest pain or abdominal pain  No fevers, chills, sore throat, headache, diarrhea  Continued to feel poorly due to the shortness of breath, called her PCP for an appointment today    Went to PCPs office, sats 75% on 4 L nasal cannula, looked acutely short of breath  Sent to the emergency room    Emergency room  required 100% nonrebreather  When I saw her she was resting comfortably, denies shortness of breath  even though her sats were approximately 90% on the 100% oxygen  WBC 8.4  BUN/creatinine 55 and 1.61, prior baseline creatinine was 0.8 and 1.1  proBNP was 16,382  Chest x-ray had but right basilar airspace disease and right effusion  CT of the chest with no PE there is a moderate effusion with some compression versus airspace disease  ABG on 100% oxygen pH 7.28, PCO2 47, PO2 83  Received Lasix 40 mg  IV Solu-Medrol, multiple nebs  Was not wheezing when I saw her  We were called to admit the patient                Past Medical History:   Diagnosis Date   ??? Chronic obstructive pulmonary disease ([place])    ??? Congestive heart failure ([place])    ??? Diabetes ([place])    ??? Hypertension           Social History     Tobacco Use   ??? Smoking status: Never Smoker   ??? Smokeless tobacco: Never Used   Substance Use Topics   ??? Alcohol use: No     Alcohol/week: 0.0 oz        Family History   Problem Relation Age of Onset   ??? Diabetes Mother    ??? Arthritis-osteo Father    ??? COPD Sister    ??? Diabetes Brother    ??? Diabetes Brother          Allergies   Allergen Reactions   ??? Lisinopril Angioedema        Prior to Admission medications    Medication Sig Start Date End Date Taking? Authorizing Provider   amLODIPine (NORVASC) 10 mg tablet Take 10 mg by mouth daily.   Yes Provider, Historical   furosemide (LASIX) 40 mg tablet Take 40 mg by mouth daily.   Yes Provider, Historical   furosemide (LASIX) 80 mg tablet Take 80 mg by mouth daily.   Yes Provider, Historical   acetaminophen (TYLENOL) 500 mg tablet Take 500 mg by mouth every six (6) hours as needed for Pain.   Yes Provider, Historical   isosorbide dinitrate (ISORDIL) 10 mg tablet TAKE 3 TABLETS BY MOUTH THREE TIMES DAILY 10/01/17  Yes Domah, Nau D, MD   cholecalciferol (VITAMIN D3) 1,000 unit tablet Take 1 Tab by mouth daily. 08/18/17  Yes Domah, Carlye Grippe, MD   metoprolol succinate (TOPROL-XL) 50 mg XL tablet TAKE 1 AND 1/2 TABLET BY MOUTH EVERY DAY 08/11/17  Yes Domah, Nau D, MD   diclofenac (VOLTAREN) 1 % gel APPLY 4 GRAMS EXTERNALLY TO THE AFFECTED AREA THREE TIMES DAILY APPLY TO LOWER BACK AS WELL 07/30/17  Yes Domah, Nau D, MD   potassium chloride SR (KLOR-CON 10) 10 mEq tablet TAKE 1 TABLET BY MOUTH TWICE DAILY 05/02/17  Yes Domah, Nau D, MD   spironolactone (ALDACTONE) 50 mg tablet Take 1 Tab by mouth daily. 05/01/17  Yes Domah, Carlye Grippe, MD   pravastatin (PRAVACHOL) 20 mg tablet Take 1 Tab by mouth nightly. 05/01/17  Yes Domah, Carlye Grippe, MD   aspirin delayed-release 81 mg tablet Take 1 Tab by mouth daily. 05/01/17  Yes Domah, Nau D, MD   albuterol-ipratropium (DUO-NEB) 2.5 mg-0.5 mg/3 ml nebu 3 mL by Nebulization route every six (6) hours as needed. COPD  J 44.9 04/03/17  Yes Mencias, Leandro Reasoner, MD       Review of symptoms:     POSITIVE= Bold  Negative = not bold  General:  fever, chills, sweats, generalized weakness  Eyes:    blurred vision, eye pain, double vision  ENT:    [place], sore throat, trouble swallowing  Respiratory:   cough, sputum, SOB  Cardiology:   chest pain, orthopnea, PND, edema   Gastrointestinal:  abdominal pain , N/V x 1 last week, diarrhea, constipation,                                        melena or BRBPR  Genitourinary:  Urgency, dysuria, hematuria  Muskuloskeletal :  Joint redness, swelling or acute joint pain, myalgias  Hematology:  easy bruising, nose or gum bleeding  Dermatological: rash, ulceration  Endocrine:   Polyuria or polydipsia, heat or hold intolerance  Neurological:  Headache, focal motor or sensory changes     Speech difficulties, memory loss  Psychological: depression, agitation      Objective:   VITALS:    Patient Vitals for the past 24 hrs:   Temp Pulse Resp BP SpO2   10/25/2017 2149 ??? 76 17 ??? 94 %   10/15/2017 2145 ??? 78 20 128/67 (!) 84 %   10/10/2017 1830 ??? 70 22 107/66 92 %   09/30/2017 1745 ??? 71 24 123/78 92 %   10/30/2017 1553 ??? ??? ??? ??? (!) 82 %   10/17/2017 1551 97.6 ??F (36.4 ??C) 73 18 143/68 (!) 75 %     Temp (24hrs), Avg:97 ??F (36.1 ??C), Min:96.3 ??F (35.7 ??C), Max:97.6 ??F (36.4 ??C)    O2 Flow Rate (L/min): 15 l/min O2 Device: Non-rebreather mask    Wt Readings from Last 12 Encounters:   10/23/2017 119.7 kg (264 lb)   09/11/17 119.2 kg (262 lb 11.2 oz)   07/30/17 118.4 kg (261 lb)   06/19/17 117 kg (258 lb)   05/29/17 117.5 kg (259 lb)   05/28/17 117.7 kg (259 lb 8.4 oz)   05/18/17 117.5 kg (259 lb)   05/06/17 117.9 kg (260 lb)   05/01/17 118.6 kg (261 lb 8.5 oz)   04/30/17 118.8 kg (261 lb 12.8 oz)   04/28/17 118.8 kg (261 lb 12.8 oz)   04/28/17 118.8 kg (261 lb 12.8 oz)         PHYSICAL EXAM:   General:    Alert, cooperative in no distress  On 100% O2  HEENT: Normocephalic, atraumatic    PERRL, Sclera no icterus    Nasal mucosa without masses or discharge  Hearing intact to voice    Oropharynx without erythema or exudate  Pink MM  Neck:  No meningismus, trachea midline, no carotid bruits     Thyroid not enlarged, no nodules or tenderness  Lungs:   Decreased BS right base, few crackles bilaterally. No wheezing    No accessory muscle use or retractions.   Heart:   Regular rate and rhythm,  no murmur or gallop.    Non pitting LE edema  Abdomen:   Soft, non-tender. Not distended.  Bowel sounds normal.     No masses, No Hepatosplenomegaly, No Rebound or guarding  Lymph nodes: No cervical or inguinal LAN  Musculoskeletal:  No Joint swelling, erythema, warmth. No Cyanosis or clubbing  Skin:      3 small white patches on right fifth finger    Not Jaundiced   No nodules or thickening  Neurologic: Alert and oriented X 3, follows commands     Cranial nerves 2  to 12 intact    Symmetric motor strength bilaterally       LAB DATA REVIEWED:    Recent Results (from the past 12 hour(s))   EKG, 12 LEAD, INITIAL    Collection Time: 10/18/2017  3:57 PM   Result Value Ref Range    Ventricular Rate 70 BPM    Atrial Rate 70 BPM    P-R Interval 202 ms    QRS Duration 80 ms    Q-T Interval 370 ms    QTC Calculation (Bezet) 399 ms    Calculated P Axis 24 degrees    Calculated R Axis 99 degrees    Calculated T Axis 0 degrees    Diagnosis       Normal sinus rhythm  Rightward axis  Cannot rule out Inferior infarct , age undetermined  ST & T wave abnormality, consider anterior ischemia  When compared with ECG of 19-Mar-2017 21:47,  Nonspecific T wave abnormality now evident in Lateral leads     CBC WITH AUTOMATED DIFF    Collection Time: 10/02/2017  4:35 PM   Result Value Ref Range    WBC 8.4 3.6 - 11.0 K/uL    RBC 4.53 3.80 - 5.20 M/uL    HGB 12.7 11.5 - 16.0 g/dL    HCT 41.8 35.0 - 47.0 %    MCV 92.3 80.0 - 99.0 FL    MCH 28.0 26.0 - 34.0 PG    MCHC 30.4 30.0 - 36.5 g/dL    RDW 18.4 (H) 11.5 - 14.5 %    PLATELET 310 150 - 400 K/uL    MPV 11.9 8.9 - 12.9 FL    NRBC 0.8 (H) 0 PER 100 WBC    ABSOLUTE NRBC 0.07 (H) 0.00 - 0.01 K/uL    NEUTROPHILS 74 32 - 75 %    LYMPHOCYTES 13 12 - 49 %    MONOCYTES 11 5 - 13 %    EOSINOPHILS 1 0 - 7 %    BASOPHILS 0 0 - 1 %    IMMATURE GRANULOCYTES 1 (H) 0.0 - 0.5 %    ABS. NEUTROPHILS 6.2 1.8 - 8.0 K/UL    ABS. LYMPHOCYTES 1.1 0.8 - 3.5 K/UL     ABS. MONOCYTES 0.9 0.0 - 1.0 K/UL    ABS. EOSINOPHILS 0.1 0.0 - 0.4 K/UL    ABS. BASOPHILS 0.0 0.0 - 0.1 K/UL    ABS. IMM. GRANS. 0.1 (H) 0.00 - 0.04 K/UL    DF AUTOMATED     POC G3 - PUL    Collection Time: 10/07/2017  5:02 PM   Result Value Ref Range    pH (POC) 7.284 (L) 7.35 - 7.45      pCO2 (POC) 46.9 (H) 35.0 - 45.0 MMHG    pO2 (POC) 83 80 - 100 MMHG    HCO3 (POC) 22.2 22 - 26 MMOL/L    sO2 (POC) 95 92 - 97 %    Base deficit (POC) 4 mmol/L    Site RIGHT RADIAL      Device: Non rebreather      Flow rate (POC) 15 L/M    Allens test (POC) YES      Specimen type (POC) ARTERIAL      Total resp. rate 19     CK W/ REFLX CKMB    Collection Time: 10/28/2017  5:19 PM   Result Value Ref Range    CK 50 26 - 578 U/L   METABOLIC PANEL, COMPREHENSIVE  Collection Time: 10/07/2017  5:19 PM   Result Value Ref Range    Sodium 137 136 - 145 mmol/L    Potassium 5.0 3.5 - 5.1 mmol/L    Chloride 105 97 - 108 mmol/L    CO2 24 21 - 32 mmol/L    Anion gap 8 5 - 15 mmol/L    Glucose 119 (H) 65 - 100 mg/dL    BUN 55 (H) 6 - 20 MG/DL    Creatinine 1.61 (H) 0.55 - 1.02 MG/DL    BUN/Creatinine ratio 34 (H) 12 - 20      GFR est AA 39 (L) >60 ml/min/1.23m    GFR est non-AA 32 (L) >60 ml/min/1.715m   Calcium 8.1 (L) 8.5 - 10.1 MG/DL    Bilirubin, total 1.1 (H) 0.2 - 1.0 MG/DL    ALT (SGPT) 40 12 - 78 U/L    AST (SGOT) 21 15 - 37 U/L    Alk. phosphatase 118 (H) 45 - 117 U/L    Protein, total 8.3 (H) 6.4 - 8.2 g/dL    Albumin 3.5 3.5 - 5.0 g/dL    Globulin 4.8 (H) 2.0 - 4.0 g/dL    A-G Ratio 0.7 (L) 1.1 - 2.2     D DIMER    Collection Time: 10/12/2017  5:19 PM   Result Value Ref Range    D-dimer 1.55 (H) 0.00 - 0.65 mg/L FEU   TROPONIN I    Collection Time: 10/04/2017  5:19 PM   Result Value Ref Range    Troponin-I, Qt. <0.05 <0.05 ng/mL   MAGNESIUM    Collection Time: 10/14/2017  5:19 PM   Result Value Ref Range    Magnesium 2.5 (H) 1.6 - 2.4 mg/dL   NT-PRO BNP    Collection Time: 10/19/2017  5:19 PM   Result Value Ref Range     NT pro-BNP 16,372 (H) <125 PG/ML       EKG as read by me shows NSR rate 70, RAD, no LVH  Normal intervals, anterior TWI    CXR read by radiology and reviewed by myself results:  Right ASD and right effusion  CTA chest scan read by by radiology results:  Chest:   CTPA: The pulmonary arteries are well opacified and there is no evidence of  pulmonary embolism.   Lymph nodes: There is no axillary, mediastinal or hilar lymphadenopathy.   Heart: The heart is mildly enlarged. There is no pericardial fluid.  Lungs/pleura: There is a small right pleural effusion. There is right basilar  compressive atelectasis. There is no left pleural fluid. Left lung is clear.  Bones: No lytic or sclerotic osseous lesion is visualized.  Upper abdomen: The visualized upper abdominal structures are normal.  IMPRESSION  1. No evidence pulmonary embolism.   2. Small right basilar pleural effusion and right basilar compressive  atelectasis.  3. Cardiomegaly.    I saw the patient personally, took a history and did a complete physical exam at the bedside. I performed complex decision making in coming up with a diagnostic and treatment plan for the patient. I reviewed the patient's past medical records, current laboratory and radiology results, and actual Xray films/EKG. I have also discussed this case with the involved ED physician.    Care Plan discussed with:    Patient, ED Doc    Risk of deterioration:  High    Total Time Coordinating Admission:  65   minutes    Total Critical Care Time:  Dara Hoyer, MD

## 2017-10-21 NOTE — ED Notes (Signed)
Pt assisted to bedside commode and back to stretcher

## 2017-10-21 NOTE — Progress Notes (Signed)
Heparin adjusted to 7500 units every 8 hr for bmi > 40 per protocol    Denice Bors, PHARMD

## 2017-10-21 NOTE — Progress Notes (Signed)
Chief Complaint   Patient presents with   ??? Hand Swelling     Patient was brought to the exam room with o2 NC of 55% on 4liters. Increase to 83% on 4Liters Mask. EMS called transport patient to the ER.

## 2017-10-21 NOTE — Progress Notes (Signed)
Pharmacy Clarification of Prior to Admission Medication Regimen     The patient was interviewed regarding clarification of the prior to admission medication regimen and was questioned regarding use of any other inhalers, topical products, over the counter medications, herbal medications, vitamin products or ophthalmic/nasal/otic medication use.     Information Obtained From: Patient, RX Query    Pertinent Pharmacy Findings:  furosemide (LASIX): Patient stated she take 'furosemide' and 'lasix', one is '40 mg' and the other is '80 mg' daily    PTA medication list was corrected to the following:     Prior to Admission Medications   Prescriptions Last Dose Informant Patient Reported? Taking?   acetaminophen (TYLENOL) 500 mg tablet 10/20/2017 at Unknown time Self Yes Yes   Sig: Take 500 mg by mouth every six (6) hours as needed for Pain.   albuterol-ipratropium (DUO-NEB) 2.5 mg-0.5 mg/3 ml nebu 10/20/2017 at Unknown time Self No Yes   Sig: 3 mL by Nebulization route every six (6) hours as needed. COPD  J 44.9   amLODIPine (NORVASC) 10 mg tablet Nov 17, 2017 at Unknown time Self Yes Yes   Sig: Take 10 mg by mouth daily.   aspirin delayed-release 81 mg tablet 2017/11/17 at Unknown time Self No Yes   Sig: Take 1 Tab by mouth daily.   cholecalciferol (VITAMIN D3) 1,000 unit tablet November 17, 2017 at Unknown time Self No Yes   Sig: Take 1 Tab by mouth daily.   diclofenac (VOLTAREN) 1 % gel 2017-11-17 at Unknown time Self No Yes   Sig: APPLY 4 GRAMS EXTERNALLY TO THE AFFECTED AREA THREE TIMES DAILY APPLY TO LOWER BACK AS WELL   furosemide (LASIX) 40 mg tablet 11/17/17 at Unknown time Self Yes Yes   Sig: Take 40 mg by mouth daily.   furosemide (LASIX) 80 mg tablet Nov 17, 2017 at Unknown time Self Yes Yes   Sig: Take 80 mg by mouth daily.   isosorbide dinitrate (ISORDIL) 10 mg tablet 2017-11-17 at Unknown time Self No Yes   Sig: TAKE 3 TABLETS BY MOUTH THREE TIMES DAILY    metoprolol succinate (TOPROL-XL) 50 mg XL tablet 11/17/2017 at Unknown time Self No Yes   Sig: TAKE 1 AND 1/2 TABLET BY MOUTH EVERY DAY   potassium chloride SR (KLOR-CON 10) 10 mEq tablet 2017-11-17 at Unknown time Self No Yes   Sig: TAKE 1 TABLET BY MOUTH TWICE DAILY   pravastatin (PRAVACHOL) 20 mg tablet 10/20/2017 at Unknown time Self No Yes   Sig: Take 1 Tab by mouth nightly.   spironolactone (ALDACTONE) 50 mg tablet 11/17/17 at Unknown time Self No Yes   Sig: Take 1 Tab by mouth daily.      Facility-Administered Medications: None          Thank you,  Hendricks Milo, CPhT  Medication History Pharmacologist

## 2017-10-21 NOTE — Progress Notes (Signed)
Brittney Shelton is a 67 y.o. female presented to clinic with respiratory hypoxia with oxygen saturation in the 60on 4 L. Patient was redirected to the ER

## 2017-10-21 NOTE — Progress Notes (Signed)
2103- Tried to call for report no answer  2107- Tried to call for report no answer    TRANSFER - IN REPORT:    Verbal report received from Lauren, RN(name) on [name]  being received from ED(unit) for routine progression of care      Report consisted of patient???s Situation, Background, Assessment and   Recommendations(SBAR).     Information from the following report(s) SBAR, Kardex, ED Summary, Recent Results and Cardiac Rhythm (NSR) was reviewed with the receiving nurse.    Opportunity for questions and clarification was provided.      Assessment completed upon patient???s arrival to unit and care assumed.

## 2017-10-21 NOTE — ED Notes (Signed)
Pts neb tx complete, placed back on NRB at 15 liters, sats in upper 70's, informed Dr. Luberta Robertson, oxygen sats bounce between 75-88%

## 2017-10-21 NOTE — Progress Notes (Signed)
Problem: Activity Intolerance  Goal: *Oxygen saturation during activity within specified parameters  Outcome: Progressing Towards Goal  Goal: *Able to remain out of bed as prescribed  Outcome: Progressing Towards Goal     Problem: Patient Education: Go to Patient Education Activity  Goal: Patient/Family Education  Outcome: Progressing Towards Goal

## 2017-10-21 NOTE — ED Notes (Signed)
TRANSFER - OUT REPORT:    Verbal report given to Savannah(name) on [name]  being transferred to PCU(unit) for routine progression of care       Report consisted of patient???s Situation, Background, Assessment and   Recommendations(SBAR).     Information from the following report(s) SBAR, Kardex, ED Summary, [facility] and Recent Results was reviewed with the receiving nurse.    Lines:   Peripheral IV 17-Nov-2017 Left Hand (Active)       Peripheral IV 2017-11-17 Left Antecubital (Active)   Site Assessment Clean, dry, & intact 11-17-17  6:31 PM   Phlebitis Assessment 0 2017/11/17  6:31 PM   Infiltration Assessment 0 2017-11-17  6:31 PM   Dressing Status Clean, dry, & intact 2017/11/17  6:31 PM   Dressing Type Transparent November 17, 2017  6:31 PM   Hub Color/Line Status Pink 11/17/2017  6:31 PM        Opportunity for questions and clarification was provided.      Patient transported with:   Monitor  O2 @ 6 liters  Registered Nurse

## 2017-10-21 NOTE — ED Notes (Signed)
Pt placed on simple mask by RT, oxygen sats now 92%

## 2017-10-21 NOTE — ED Provider Notes (Signed)
EMERGENCY DEPARTMENT HISTORY AND PHYSICAL EXAM      Date: 10/26/2017  Patient Name: Brittney Shelton    History of Presenting Illness     Chief Complaint   Patient presents with   ??? Shortness of Breath     pt arrives by EMS with reports of shortness of breath, went to [id number], oxygen sats in 70's on room air, PCP office put pt on 4 liters which she normally uses however sats were in 80's, put pt on NRB and brought her up to upper 90's       History Provided By: Patient    HPI: Brittney Shelton, 67 y.o. female with PMHx significant for diabetes, hypertension, COPD, CHF, presents to the ED with cc of 2 weeks of gradual onset and progressive weakness, shortness of breath, and low oxygen as oxygen saturations.  Patient reports that she chronically is on 4 L of supplemental oxygen by nasal cannula but is noticed low oxygen levels in the high 70s and low consequently she also feels very weak during this timeframe and today she felt that she should see a doctor for further evaluation.  Patient denies any productive cough, chest pain, fevers, chills, abdominal pain, headache, vomiting, diarrhea, or any other associated symptoms.  She has had no recent change in her medications.  She has not noticed any increased swelling in her legs and does not feel this feels like a COPD exacerbation.  No other exacerbating or ameliorating factors.          There are no other complaints, changes, or physical findings at this time.    PCP: Erenest Rasher, MD    No current facility-administered medications on file prior to encounter.      Current Outpatient Medications on File Prior to Encounter   Medication Sig Dispense Refill   ??? amLODIPine (NORVASC) 10 mg tablet Take 10 mg by mouth daily.     ??? furosemide (LASIX) 40 mg tablet Take 40 mg by mouth daily.     ??? furosemide (LASIX) 80 mg tablet Take 80 mg by mouth daily.     ??? acetaminophen (TYLENOL) 500 mg tablet Take 500 mg by mouth every six (6) hours as needed for Pain.      ??? isosorbide dinitrate (ISORDIL) 10 mg tablet TAKE 3 TABLETS BY MOUTH THREE TIMES DAILY 270 Tab 0   ??? cholecalciferol (VITAMIN D3) 1,000 unit tablet Take 1 Tab by mouth daily. 30 Tab 3   ??? metoprolol succinate (TOPROL-XL) 50 mg XL tablet TAKE 1 AND 1/2 TABLET BY MOUTH EVERY DAY 135 Tab 0   ??? diclofenac (VOLTAREN) 1 % gel APPLY 4 GRAMS EXTERNALLY TO THE AFFECTED AREA THREE TIMES DAILY APPLY TO LOWER BACK AS WELL 100 g 0   ??? spironolactone (ALDACTONE) 50 mg tablet Take 1 Tab by mouth daily. 90 Tab 1   ??? pravastatin (PRAVACHOL) 20 mg tablet Take 1 Tab by mouth nightly. 90 Tab 1   ??? aspirin delayed-release 81 mg tablet Take 1 Tab by mouth daily. 90 Tab 1   ??? albuterol-ipratropium (DUO-NEB) 2.5 mg-0.5 mg/3 ml nebu 3 mL by Nebulization route every six (6) hours as needed. COPD  J 44.9 100 Nebule 1       Past History     Past Medical History:  Past Medical History:   Diagnosis Date   ??? Chronic obstructive pulmonary disease (HCC)    ??? Congestive heart failure (HCC)    ??? Diabetes (HCC)    ???  Hypertension        Past Surgical History:  History reviewed. No pertinent surgical history.    Family History:  Family History   Problem Relation Age of Onset   ??? Diabetes Mother    ??? Arthritis-osteo Father    ??? COPD Sister    ??? Diabetes Brother    ??? Diabetes Brother        Social History:  Social History     Tobacco Use   ??? Smoking status: Never Smoker   ??? Smokeless tobacco: Never Used   Substance Use Topics   ??? Alcohol use: No     Alcohol/week: 0.0 oz   ??? Drug use: No       Allergies:  Allergies   Allergen Reactions   ??? Lisinopril Angioedema         Review of Systems   Review of Systems   Constitutional: Positive for fatigue. Negative for chills, diaphoresis and fever.   HENT: Negative for ear pain and sore throat.    Eyes: Negative for pain and redness.   Respiratory: Positive for shortness of breath. Negative for cough.    Cardiovascular: Negative for chest pain and leg swelling.    Gastrointestinal: Negative for abdominal pain, diarrhea, nausea and vomiting.   Endocrine: Negative for cold intolerance and heat intolerance.   Genitourinary: Negative for flank pain and hematuria.   Musculoskeletal: Negative for back pain and neck stiffness.   Skin: Negative for rash and wound.   Neurological: Positive for weakness. Negative for dizziness, syncope and headaches.   All other systems reviewed and are negative.      Physical Exam   Physical Exam   Constitutional: She is oriented to person, place, and time. She appears well-developed and well-nourished.   Patient is a elderly obese female who appears in mild distress.  Patient appears slightly lethargic but is answering questions appropriately and is alert and oriented x3.  She is in no acute respiratory distress.   HENT:   Head: Normocephalic and atraumatic.   Mouth/Throat: Oropharynx is clear and moist. No oropharyngeal exudate.   Eyes: Pupils are equal, round, and reactive to light. Conjunctivae and EOM are normal.   Neck: Normal range of motion.   Cardiovascular: Normal rate and regular rhythm.   No murmur heard.  Pulmonary/Chest: Effort normal and breath sounds normal. No respiratory distress. She has no wheezes.   Patient has no respiratory distress does not use accessory muscle use.  There is no wheezing, rales, rhonchi.   Abdominal: Soft. Bowel sounds are normal. She exhibits no distension. There is no tenderness.   Musculoskeletal: Normal range of motion. She exhibits no edema or deformity.   Patient has chronic 2+ nonpitting lower edema that is symmetric in both lower extremities.   Neurological: She is alert and oriented to person, place, and time. Coordination normal.   Skin: Skin is warm and dry. No rash noted.   Psychiatric: She has a normal mood and affect. Her behavior is normal.   Nursing note and vitals reviewed.      Diagnostic Study Results     Labs -     Recent Results (from the past 24 hour(s))   GLUCOSE, POC     Collection Time: 10/26/17  9:55 PM   Result Value Ref Range    Glucose (POC) 128 (H) 65 - 100 mg/dL    Performed by Michiel Sites    GLUCOSE, POC    Collection Time: 10/27/17  7:47 AM  Result Value Ref Range    Glucose (POC) 107 (H) 65 - 100 mg/dL    Performed by Rexene Agent    GLUCOSE, POC    Collection Time: 10/27/17 11:34 AM   Result Value Ref Range    Glucose (POC) 196 (H) 65 - 100 mg/dL    Performed by Rexene Agent    GLUCOSE, POC    Collection Time: 10/27/17  4:29 PM   Result Value Ref Range    Glucose (POC) 182 (H) 65 - 100 mg/dL    Performed by Rexene Agent        Radiologic Studies -   XR CHEST PORT   Final Result   No significant change.         XR CHEST PORT   Final Result   Impression:   No significant interval change.      XR CHEST PORT   Final Result   Stable interstitial edema pattern with pleural effusions right greater than   left..  .         XR CHEST PORT   Final Result   Cardiomegaly. Layering right pleural effusion.      CTA CHEST W OR W WO CONT   Final Result   1. No evidence pulmonary embolism.    2. Small right basilar pleural effusion and right basilar compressive   atelectasis.   3. Cardiomegaly.          XR CHEST PORT   Final Result   Technical factors as above with cardiomegaly and right perihilar/lower lobe   airspace disease and possible effusion. Recommend PA and lateral views when   possible.      XR CHEST PORT    (Results Pending)     CT Results  (Last 48 hours)    None        CXR Results  (Last 48 hours)               10/26/17 0509  XR CHEST PORT Final result    Impression:  No significant change.           Narrative:  EXAM:  XR CHEST PORT.   INDICATION: Respiratory failure.   COMPARISON: 10/25/2017.       FINDINGS:    A portable AP radiograph of the chest was obtained at 0442 hours. The   positioning is lordotic.   Lines and tubes: The patient is on a cardiac monitor.   Lungs: The lungs are clear.    Pleura: There is no pneumothorax or pleural effusion.    Mediastinum: The cardiac silhouette is enlarged.   Bones and soft tissues: The bones and soft tissues are grossly within normal   limits.                   Medical Decision Making   I am the first provider for this patient.    I reviewed the vital signs, available nursing notes, past medical history, past surgical history, family history and social history.    Vital Signs-Reviewed the patient's vital signs.  Patient Vitals for the past 24 hrs:   Temp Pulse Resp BP SpO2   10/27/17 2000 98.8 ??F (37.1 ??C) 83 23 120/69 93 %   10/27/17 1905 ??? ??? ??? ??? 95 %   10/27/17 1900 ??? 79 18 116/58 96 %   10/27/17 1830 ??? ??? ??? ??? 91 %   10/27/17 1828 ??? ??? ??? ??? (!) 87 %   10/27/17 1800 ???  84 25 107/57 92 %   10/27/17 1700 ??? 79 21 135/69 92 %   10/27/17 1600 ??? 81 20 142/90 95 %   10/27/17 1549 ??? ??? ??? ??? 95 %   10/27/17 1500 ??? 82 22 144/90 91 %   10/27/17 1425 ??? ??? ??? ??? 95 %   10/27/17 1408 98 ??F (36.7 ??C) 90 25 125/62 (!) 87 %   10/27/17 1300 ??? 87 19 133/76 (!) 87 %   10/27/17 1200 ??? 85 23 116/72 (!) 88 %   10/27/17 1136 ??? ??? ??? ??? 93 %   10/27/17 1100 97.7 ??F (36.5 ??C) 80 22 112/65 91 %   10/27/17 1006 ??? 89 ??? (!) 133/98 ???   10/27/17 1000 ??? 81 18 117/66 (!) 88 %   10/27/17 0959 ??? 78 ??? 117/66 ???   10/27/17 0821 ??? ??? ??? ??? 90 %   10/27/17 0800 ??? 75 18 110/67 (!) 84 %   10/27/17 0700 97.7 ??F (36.5 ??C) 68 19 93/54 (!) 87 %   10/27/17 0600 ??? 76 21 101/69 94 %   10/27/17 0500 ??? 73 20 99/60 92 %   10/27/17 0400 97.9 ??F (36.6 ??C) 71 19 111/55 94 %   10/27/17 0338 ??? ??? ??? ??? 94 %   10/27/17 0301 ??? ??? ??? ??? 94 %   10/27/17 0300 97.9 ??F (36.6 ??C) 70 20 107/50 90 %   10/27/17 0200 ??? 72 20 110/63 93 %   10/27/17 0100 ??? 76 19 113/75 95 %   10/27/17 0000 97.6 ??F (36.4 ??C) 78 20 96/57 95 %   10/26/17 2333 ??? ??? ??? ??? 95 %   10/26/17 2307 ??? ??? ??? ??? 95 %   10/26/17 2300 ??? 74 20 114/64 92 %   10/26/17 2200 ??? 78 17 127/80 95 %       Pulse Oximetry Analysis -92 % on 15 L    Cardiac Monitor:   Rate: 70 bpm  Rhythm: Normal Sinus Rhythm       EKG: Normal sinus rhythm with a rate of 70 and a right axis deviation.  T wave inversions in V1 through V4 and the inferior leads.  This is unchanged from EKG from 03/19/2017.  No STEMI.  Normal QRS and QT intervals.  Prolonged PR interval.  Records Reviewed: Nursing Notes and Old Medical Records    Differential Diagnosis:    Patient presents with acute dyspnea. DDx: asthma, copd, pna, pulmonary edema, acute bronchitis, ACS, ptx, pna. Will obtain EKG, labs, CXR, provide O2 as needed for hypoxia, treat symptomatically and reassess. Will continue to monitor closely in ED.       Provider Notes (Medical Decision Making):   Patient with clear evidence of severe COPD exacerbation requiring significant supplemental oxygen.  Patient had no indication for intubation based on ABG values and clinical assessment.  However given profound hypoxia will admit for further management    ED Course:     Initial assessment performed. The patients presenting problems have been discussed, and they are in agreement with the care plan formulated and outlined with them.  I have encouraged them to ask questions as they arise throughout their visit.    ED Course as of Oct 28 2103   Wed Oct 21, 2017   1649 Called to bedside by nurse because of low oxygen saturations.  Improved after several deep breaths but will obtain ABG to further evaluate.  Patient is mentating well and in  no acute respiratory distress.  Work-up in progress.    [CC]   1844 We will give patient a very small amount of IV hydration because of her elevated creatinine and she is about to get a CT Angie of the chest to rule out pulmonary embolism.    [CC]   2022 CT scan negative for PE or pneumonia.  Patient with likely mixture of CHF exacerbation and COPD exacerbation.  Given significant oxygen requirement will admit for further management.  Mild disc compensation seen on ABG.  Patient mentating well and in no clear respiratory distress  at this time.  In a patient not indicated.  Discussed with hospitalist and will admit for further management.    [CC]      ED Course User Index  [CC] Marlowe Shores, MD       Critical Care Time:     CRITICAL CARE NOTE :    9:05 PM      IMPENDING DETERIORATION -Airway and Respiratory    ASSOCIATED RISK FACTORS - Hypoxia    MANAGEMENT- Bedside Assessment and Supervision of Care    INTERPRETATION -  Xrays and CT Scan    INTERVENTIONS -supplemental oxygen serial nebulizer treatments close bedside monitoring    CASE REVIEW - Hospitalist    TREATMENT RESPONSE -Improved    PERFORMED BY - Self        NOTES   :      I have spent 35 minutes of critical care time involved in lab review, consultations with specialist, family decision- making, bedside attention and documentation. During this entire length of time I was immediately available to the patient .    Marlowe Shores, MD      Disposition:  Admit Note:  9:06 PM  Pt is being admitted by hospitalist. The results of their tests and reason(s) for their admission have been discussed with pt and/or available family. They convey agreement and understanding for the need to be admitted and for admission diagnosis.        PLAN:  1.   Current Discharge Medication List        2.   Follow-up Information    None       Return to ED if worse     Diagnosis     Clinical Impression:   1. COPD exacerbation (HCC)    2. Acute respiratory failure with hypoxia (HCC)    3. Acute on chronic congestive heart failure, unspecified heart failure type (HCC)

## 2017-10-22 ENCOUNTER — Inpatient Hospital Stay: Admit: 2017-10-22 | Payer: MEDICARE | Primary: Internal Medicine

## 2017-10-22 LAB — METABOLIC PANEL, COMPREHENSIVE
A-G Ratio: 0.8 — ABNORMAL LOW (ref 1.1–2.2)
ALT (SGPT): 34 U/L (ref 12–78)
AST (SGOT): 14 U/L — ABNORMAL LOW (ref 15–37)
Albumin: 3.2 g/dL — ABNORMAL LOW (ref 3.5–5.0)
Alk. phosphatase: 100 U/L (ref 45–117)
Anion gap: 6 mmol/L (ref 5–15)
BUN/Creatinine ratio: 33 — ABNORMAL HIGH (ref 12–20)
BUN: 54 MG/DL — ABNORMAL HIGH (ref 6–20)
Bilirubin, total: 0.8 MG/DL (ref 0.2–1.0)
CO2: 25 mmol/L (ref 21–32)
Calcium: 7.9 MG/DL — ABNORMAL LOW (ref 8.5–10.1)
Chloride: 106 mmol/L (ref 97–108)
Creatinine: 1.62 MG/DL — ABNORMAL HIGH (ref 0.55–1.02)
GFR est AA: 38 mL/min/{1.73_m2} — ABNORMAL LOW (ref >60)
GFR est non-AA: 32 mL/min/{1.73_m2} — ABNORMAL LOW (ref >60)
Globulin: 3.8 g/dL (ref 2.0–4.0)
Glucose: 253 mg/dL — ABNORMAL HIGH (ref 65–100)
Potassium: 5.5 mmol/L — ABNORMAL HIGH (ref 3.5–5.1)
Protein, total: 7 g/dL (ref 6.4–8.2)
Sodium: 137 mmol/L (ref 136–145)

## 2017-10-22 LAB — POC G3 - PUL
Base deficit (POC): 3 mmol/L
Base deficit (POC): 4 mmol/L
Base deficit (POC): 2 mmol/L
FIO2 (POC): 100 %
FIO2 (POC): 100 %
FIO2 (POC): 100 %
Flow rate (POC): 55 L/min
HCO3 (POC): 24.6 MMOL/L (ref 22–26)
HCO3 (POC): 23.6 MMOL/L (ref 22–26)
HCO3 (POC): 24.3 MMOL/L (ref 22–26)
PEEP/CPAP (POC): 12 cmH2O
PEEP/CPAP (POC): 12 cmH2O
PIP (POC): 18
PIP (POC): 18
Total resp. rate: 26
Total resp. rate: 24
Total resp. rate: 18
pCO2 (POC): 55.6 MMHG — ABNORMAL HIGH (ref 35.0–45.0)
pCO2 (POC): 55.4 MMHG — ABNORMAL HIGH (ref 35.0–45.0)
pCO2 (POC): 47.1 MMHG — ABNORMAL HIGH (ref 35.0–45.0)
pH (POC): 7.254 — ABNORMAL LOW (ref 7.35–7.45)
pH (POC): 7.238 — CL (ref 7.35–7.45)
pH (POC): 7.321 — ABNORMAL LOW (ref 7.35–7.45)
pO2 (POC): 78 MMHG — ABNORMAL LOW (ref 80–100)
pO2 (POC): 114 MMHG — ABNORMAL HIGH (ref 80–100)
pO2 (POC): 59 MMHG — ABNORMAL LOW (ref 80–100)
sO2 (POC): 93 % (ref 92–97)
sO2 (POC): 97 % (ref 92–97)
sO2 (POC): 88 % — ABNORMAL LOW (ref 92–97)

## 2017-10-22 LAB — CBC WITH AUTOMATED DIFF
ABS. BASOPHILS: 0 10*3/uL (ref 0.0–0.1)
ABS. EOSINOPHILS: 0 10*3/uL (ref 0.0–0.4)
ABS. IMM. GRANS.: 0.1 10*3/uL — ABNORMAL HIGH (ref 0.00–0.04)
ABS. LYMPHOCYTES: 0.4 10*3/uL — ABNORMAL LOW (ref 0.8–3.5)
ABS. MONOCYTES: 0.1 10*3/uL (ref 0.0–1.0)
ABS. NEUTROPHILS: 4.5 10*3/uL (ref 1.8–8.0)
ABSOLUTE NRBC: 0.03 10*3/uL — ABNORMAL HIGH (ref 0.00–0.01)
BASOPHILS: 0 % (ref 0–1)
EOSINOPHILS: 0 % (ref 0–7)
HCT: 39.2 % (ref 35.0–47.0)
HGB: 11.9 g/dL (ref 11.5–16.0)
IMMATURE GRANULOCYTES: 1 % — ABNORMAL HIGH (ref 0.0–0.5)
LYMPHOCYTES: 7 % — ABNORMAL LOW (ref 12–49)
MCH: 27.7 PG (ref 26.0–34.0)
MCHC: 30.4 g/dL (ref 30.0–36.5)
MCV: 91.2 FL (ref 80.0–99.0)
MONOCYTES: 2 % — ABNORMAL LOW (ref 5–13)
MPV: 11.8 FL (ref 8.9–12.9)
NEUTROPHILS: 90 % — ABNORMAL HIGH (ref 32–75)
NRBC: 0.6 PER 100 WBC — ABNORMAL HIGH
PLATELET: 261 10*3/uL (ref 150–400)
RBC: 4.3 M/uL (ref 3.80–5.20)
RDW: 17.7 % — ABNORMAL HIGH (ref 11.5–14.5)
WBC: 5.1 10*3/uL (ref 3.6–11.0)

## 2017-10-22 LAB — ECHO ADULT COMPLETE
AV Area by Peak Velocity: 2.5 cm2
AV Area by VTI: 2.9 cm2
AV Mean Gradient: 5.4 mmHg
AV Peak Gradient: 9.8 mmHg
AV Peak Velocity: 156.15 cm/s
AV VTI: 35.59 cm
AVA/BSA Peak Velocity: 1.1 cm2/m2
AVA/BSA VTI: 1.2 cm2/m2
Aortic Root: 3.15 cm
Est. RA Pressure: 10 mmHg
IVSd: 1.3 cm — AB (ref 0.6–0.9)
LV Mass 2D Index: 57 g/m2 (ref 43–95)
LV Mass 2D: 128.2 g (ref 67–162)
LVIDd: 3 cm — AB (ref 3.9–5.3)
LVIDs: 2.04 cm
LVOT Diameter: 2.06 cm
LVOT Peak Gradient: 5.6 mmHg
LVOT Peak Velocity: 117.87 cm/s
LVOT SV: 102.8 ml
LVOT VTI: 30.78 cm
LVPWd: 1.17 cm — AB (ref 0.6–0.9)
MV A Velocity: 95.17 cm/s
MV Area by PHT: 2.2 cm2
MV Area by VTI: 4.8 cm2
MV E Velocity: 42.36 cm/s
MV E Wave Deceleration Time: 326.7 ms
MV E/A: 0.45
MV Max Velocity: 100.89 cm/s
MV Mean Gradient: 1.5 mmHg
MV PHT: 97.9 ms
MV Peak Gradient: 4.1 mmHg
MV VTI: 21.53 cm
PASP: 96.6 mmHg
PV Max Velocity: 82.97 cm/s
PV Peak Gradient: 2.8 mmHg
RVIDd: 4.96 cm
RVSP: 96.6 mmHg
TR Max Velocity: 465.4 cm/s
TR Peak Gradient: 86.6 mmHg

## 2017-10-22 LAB — EKG, 12 LEAD, INITIAL
Atrial Rate: 70 {beats}/min
Calculated P Axis: 24 degrees
Calculated R Axis: 99 degrees
Calculated T Axis: 0 degrees
Diagnosis: NORMAL
P-R Interval: 202 ms
Q-T Interval: 370 ms
QRS Duration: 80 ms
QTC Calculation (Bezet): 399 ms
Ventricular Rate: 70 {beats}/min

## 2017-10-22 LAB — PROCALCITONIN: Procalcitonin: 0.1 ng/mL

## 2017-10-22 LAB — GLUCOSE, POC
Glucose (POC): 240 mg/dL — ABNORMAL HIGH (ref 65–100)
Glucose (POC): 235 mg/dL — ABNORMAL HIGH (ref 65–100)
Glucose (POC): 188 mg/dL — ABNORMAL HIGH (ref 65–100)

## 2017-10-22 LAB — TROPONIN I: Troponin-I, Qt.: <0.05 ng/mL (ref <0.05)

## 2017-10-22 MED ORDER — GLUCOSE 4 GRAM CHEWABLE TAB
4 gram | ORAL | Status: DC | PRN
Start: 2017-10-22 — End: 2017-11-10

## 2017-10-22 MED ORDER — IPRATROPIUM BROMIDE 0.02 % SOLN FOR INHALATION
0.02 % | RESPIRATORY_TRACT | Status: AC
Start: 2017-10-22 — End: 2017-10-21
  Administered 2017-10-22: 01:00:00 via RESPIRATORY_TRACT

## 2017-10-22 MED ORDER — AMLODIPINE 5 MG TAB
5 mg | Freq: Every day | ORAL | Status: DC
Start: 2017-10-22 — End: 2017-10-21

## 2017-10-22 MED ORDER — METOPROLOL SUCCINATE SR 25 MG 24 HR TAB
25 mg | Freq: Every day | ORAL | Status: DC
Start: 2017-10-22 — End: 2017-10-27
  Administered 2017-10-22 – 2017-10-27 (×7): via ORAL

## 2017-10-22 MED ORDER — METOPROLOL SUCCINATE SR 25 MG 24 HR TAB
25 mg | Freq: Every day | ORAL | Status: DC
Start: 2017-10-22 — End: 2017-10-21

## 2017-10-22 MED ORDER — METHYLPREDNISOLONE (PF) 40 MG/ML IJ SOLR
40 mg/mL | Freq: Four times a day (QID) | INTRAMUSCULAR | Status: DC
Start: 2017-10-22 — End: 2017-10-23
  Administered 2017-10-22 – 2017-10-23 (×5): via INTRAVENOUS

## 2017-10-22 MED ORDER — METRONIDAZOLE IN SODIUM CHLORIDE (ISO-OSM) 500 MG/100 ML IV PIGGY BACK
500 mg/100 mL | Freq: Two times a day (BID) | INTRAVENOUS | Status: DC
Start: 2017-10-22 — End: 2017-10-22
  Administered 2017-10-22: 09:00:00 via INTRAVENOUS

## 2017-10-22 MED ORDER — METHYLPREDNISOLONE (PF) 125 MG/2 ML IJ SOLR
125 mg/2 mL | INTRAMUSCULAR | Status: AC
Start: 2017-10-22 — End: 2017-10-21
  Administered 2017-10-22: 01:00:00 via INTRAVENOUS

## 2017-10-22 MED ORDER — INSULIN LISPRO 100 UNIT/ML INJECTION
100 unit/mL | Freq: Four times a day (QID) | SUBCUTANEOUS | Status: DC
Start: 2017-10-22 — End: 2017-11-10
  Administered 2017-10-23 – 2017-11-10 (×19): via SUBCUTANEOUS

## 2017-10-22 MED ORDER — ACETAMINOPHEN 325 MG TABLET
325 mg | Freq: Four times a day (QID) | ORAL | Status: DC | PRN
Start: 2017-10-22 — End: 2017-11-10
  Administered 2017-10-22 – 2017-11-07 (×14): via ORAL

## 2017-10-22 MED ORDER — HEPARIN (PORCINE) 5,000 UNIT/ML IJ SOLN
5000 unit/mL | Freq: Three times a day (TID) | INTRAMUSCULAR | Status: DC
Start: 2017-10-22 — End: 2017-10-27
  Administered 2017-10-22 – 2017-10-27 (×17): via SUBCUTANEOUS

## 2017-10-22 MED ORDER — IPRATROPIUM-ALBUTEROL 2.5 MG-0.5 MG/3 ML NEB SOLUTION
2.5 mg-0.5 mg/3 ml | RESPIRATORY_TRACT | Status: DC
Start: 2017-10-22 — End: 2017-10-31
  Administered 2017-10-22 – 2017-10-31 (×55): via RESPIRATORY_TRACT

## 2017-10-22 MED ORDER — ASPIRIN 81 MG TAB, DELAYED RELEASE
81 mg | Freq: Every day | ORAL | Status: DC
Start: 2017-10-22 — End: 2017-11-10
  Administered 2017-10-22 – 2017-11-10 (×21): via ORAL

## 2017-10-22 MED ORDER — ISOSORBIDE DINITRATE 10 MG TAB
10 mg | Freq: Three times a day (TID) | ORAL | Status: DC
Start: 2017-10-22 — End: 2017-10-21
  Administered 2017-10-22: 04:00:00 via ORAL

## 2017-10-22 MED ORDER — PRAVASTATIN 10 MG TAB
10 mg | Freq: Every evening | ORAL | Status: DC
Start: 2017-10-22 — End: 2017-11-03
  Administered 2017-10-22 – 2017-11-03 (×12): via ORAL

## 2017-10-22 MED ORDER — LEVOFLOXACIN IN D5W 750 MG/150 ML IV PIGGY BACK
750 mg/150 mL | INTRAVENOUS | Status: DC
Start: 2017-10-22 — End: 2017-10-25
  Administered 2017-10-22 – 2017-10-24 (×2): via INTRAVENOUS

## 2017-10-22 MED ORDER — INSULIN LISPRO 100 UNIT/ML INJECTION
100 unit/mL | Freq: Four times a day (QID) | SUBCUTANEOUS | Status: DC
Start: 2017-10-22 — End: 2017-10-22
  Administered 2017-10-22 (×3): via SUBCUTANEOUS

## 2017-10-22 MED ORDER — FUROSEMIDE 10 MG/ML IJ SOLN
10 mg/mL | Freq: Two times a day (BID) | INTRAMUSCULAR | Status: DC
Start: 2017-10-22 — End: 2017-10-22
  Administered 2017-10-22: 04:00:00 via INTRAVENOUS

## 2017-10-22 MED ORDER — ONDANSETRON (PF) 4 MG/2 ML INJECTION
4 mg/2 mL | INTRAMUSCULAR | Status: DC | PRN
Start: 2017-10-22 — End: 2017-11-10
  Administered 2017-10-23 – 2017-11-09 (×11): via INTRAVENOUS

## 2017-10-22 MED ORDER — HEPARIN (PORCINE) 5,000 UNIT/ML IJ SOLN
5000 unit/mL | Freq: Three times a day (TID) | INTRAMUSCULAR | Status: DC
Start: 2017-10-22 — End: 2017-10-21

## 2017-10-22 MED ORDER — SPIRONOLACTONE 25 MG TAB
25 mg | Freq: Every day | ORAL | Status: DC
Start: 2017-10-22 — End: 2017-10-22

## 2017-10-22 MED ORDER — SODIUM CHLORIDE 0.9 % IJ SYRG
INTRAMUSCULAR | Status: DC | PRN
Start: 2017-10-22 — End: 2017-11-10
  Administered 2017-10-24 – 2017-11-10 (×4): via INTRAVENOUS

## 2017-10-22 MED ORDER — SODIUM CHLORIDE 0.9 % IJ SYRG
Freq: Three times a day (TID) | INTRAMUSCULAR | Status: DC
Start: 2017-10-22 — End: 2017-11-10
  Administered 2017-10-22 – 2017-11-10 (×58): via INTRAVENOUS

## 2017-10-22 MED ORDER — NALOXONE 0.4 MG/ML INJECTION
0.4 mg/mL | INTRAMUSCULAR | Status: DC | PRN
Start: 2017-10-22 — End: 2017-11-10

## 2017-10-22 MED ORDER — SODIUM CHLORIDE 0.9 % IV
INTRAVENOUS | Status: DC
Start: 2017-10-22 — End: 2017-10-23
  Administered 2017-10-22 (×3): via INTRAVENOUS

## 2017-10-22 MED ORDER — FUROSEMIDE 10 MG/ML IJ SOLN
10 mg/mL | Freq: Two times a day (BID) | INTRAMUSCULAR | Status: DC
Start: 2017-10-22 — End: 2017-10-21

## 2017-10-22 MED ORDER — METRONIDAZOLE IN SODIUM CHLORIDE (ISO-OSM) 500 MG/100 ML IV PIGGY BACK
500 mg/100 mL | Freq: Two times a day (BID) | INTRAVENOUS | Status: DC
Start: 2017-10-22 — End: 2017-10-22

## 2017-10-22 MED ORDER — DEXTROSE 50% IN WATER (D50W) IV SYRG
INTRAVENOUS | Status: DC | PRN
Start: 2017-10-22 — End: 2017-10-23

## 2017-10-22 MED ORDER — BISACODYL 10 MG RECTAL SUPPOSITORY
10 mg | Freq: Every day | RECTAL | Status: DC | PRN
Start: 2017-10-22 — End: 2017-11-10
  Administered 2017-10-27 – 2017-11-01 (×2): via RECTAL

## 2017-10-22 MED ORDER — ALBUTEROL SULFATE 0.083 % (0.83 MG/ML) SOLN FOR INHALATION
2.5 mg /3 mL (0.083 %) | RESPIRATORY_TRACT | Status: AC
Start: 2017-10-22 — End: 2017-10-21
  Administered 2017-10-22: 01:00:00 via RESPIRATORY_TRACT

## 2017-10-22 MED ORDER — GLUCAGON 1 MG INJECTION
1 mg | INTRAMUSCULAR | Status: DC | PRN
Start: 2017-10-22 — End: 2017-11-10

## 2017-10-22 MED FILL — IPRATROPIUM-ALBUTEROL 2.5 MG-0.5 MG/3 ML NEB SOLUTION: 2.5 mg-0.5 mg/3 ml | RESPIRATORY_TRACT | Qty: 3

## 2017-10-22 MED FILL — HEPARIN (PORCINE) 5,000 UNIT/ML IJ SOLN: 5000 unit/mL | INTRAMUSCULAR | Qty: 2

## 2017-10-22 MED FILL — INSULIN LISPRO 100 UNIT/ML INJECTION: 100 unit/mL | SUBCUTANEOUS | Qty: 1

## 2017-10-22 MED FILL — BD POSIFLUSH NORMAL SALINE 0.9 % INJECTION SYRINGE: INTRAMUSCULAR | Qty: 20

## 2017-10-22 MED FILL — BD POSIFLUSH NORMAL SALINE 0.9 % INJECTION SYRINGE: INTRAMUSCULAR | Qty: 10

## 2017-10-22 MED FILL — METRONIDAZOLE IN SODIUM CHLORIDE (ISO-OSM) 500 MG/100 ML IV PIGGY BACK: 500 mg/100 mL | INTRAVENOUS | Qty: 100

## 2017-10-22 MED FILL — ASPIRIN 81 MG TAB, DELAYED RELEASE: 81 mg | ORAL | Qty: 1

## 2017-10-22 MED FILL — ALBUTEROL SULFATE 0.083 % (0.83 MG/ML) SOLN FOR INHALATION: 2.5 mg /3 mL (0.083 %) | RESPIRATORY_TRACT | Qty: 3

## 2017-10-22 MED FILL — LEVOFLOXACIN IN D5W 750 MG/150 ML IV PIGGY BACK: 750 mg/150 mL | INTRAVENOUS | Qty: 150

## 2017-10-22 MED FILL — SODIUM CHLORIDE 0.9 % IV: INTRAVENOUS | Qty: 1000

## 2017-10-22 MED FILL — ISOSORBIDE DINITRATE 10 MG TAB: 10 mg | ORAL | Qty: 1

## 2017-10-22 MED FILL — SOLU-MEDROL (PF) 40 MG/ML SOLUTION FOR INJECTION: 40 mg/mL | INTRAMUSCULAR | Qty: 1

## 2017-10-22 MED FILL — FUROSEMIDE 10 MG/ML IJ SOLN: 10 mg/mL | INTRAMUSCULAR | Qty: 10

## 2017-10-22 MED FILL — MAPAP (ACETAMINOPHEN) 325 MG TABLET: 325 mg | ORAL | Qty: 2

## 2017-10-22 MED FILL — TOPROL XL 25 MG TABLET,EXTENDED RELEASE: 25 mg | ORAL | Qty: 2

## 2017-10-22 MED FILL — PRAVASTATIN 40 MG TAB: 40 mg | ORAL | Qty: 1

## 2017-10-22 MED FILL — IPRATROPIUM BROMIDE 0.02 % SOLN FOR INHALATION: 0.02 % | RESPIRATORY_TRACT | Qty: 2.5

## 2017-10-22 MED FILL — SOLU-MEDROL (PF) 125 MG/2 ML SOLUTION FOR INJECTION: 125 mg/2 mL | INTRAMUSCULAR | Qty: 2

## 2017-10-22 NOTE — Progress Notes (Addendum)
TRANSFER - OUT REPORT:    Verbal report given to Whitney, RN(name) on Brittney Shelton  being transferred to CCU(unit) for urgent transfer       Report consisted of patient???s Situation, Background, Assessment and   Recommendations(SBAR).     Information from the following report(s) SBAR, Kardex, Procedure Summary, MAR, Recent Results and Cardiac Rhythm (NSR) was reviewed with the receiving nurse.    Lines:   Peripheral IV 10/30/2017 Left Hand (Active)   Site Assessment Clean, dry, & intact 10/14/2017 10:33 PM   Phlebitis Assessment 0 10/09/2017 10:33 PM   Infiltration Assessment 0 10/01/2017 10:33 PM   Dressing Status Clean, dry, & intact 10/02/2017 10:33 PM   Dressing Type Transparent 10/26/2017 10:33 PM   Hub Color/Line Status Pink;Flushed 10/13/2017 10:33 PM       Peripheral IV 10/12/2017 Left Antecubital (Active)   Site Assessment Clean, dry, & intact 10/28/2017 10:33 PM   Phlebitis Assessment 0 10/24/2017 10:33 PM   Infiltration Assessment 0 10/20/2017 10:33 PM   Dressing Status Clean, dry, & intact 10/14/2017 10:33 PM   Dressing Type Transparent 10/16/2017 10:33 PM   Hub Color/Line Status Pink;Flushed 10/17/2017 10:33 PM        Opportunity for questions and clarification was provided.      Patient transported with:   Monitor  O2 @ BIPAP FiO2 100% liters  Registered Nurse & RT    SHIFT SUMMARY:  2233- Pt admitted to the floor on simple mask, O2 sats 89-93%, regular RR pt denies respiratory distress  0002- O2 sats sustaining in the high 80s transitioned to NRB, no change in sats transitioned to hi flo on 12L @ 87%  0030- RT applied BIPAP and O2 is at 88%  0052- RT increased BIPAP settings and O2 is sustaining at 90%  0100- Notified Dr. Skeet Latch of change in respiratory status, no orders received.  0149- ABG's drawn  67- Messaged tele hospitalist about new ABG results and concern of respiratory status  0247- Received orders from Dr. Excell Seltzer to tx to higher level of care  0215- Tx pt out to CCU

## 2017-10-22 NOTE — Consults (Signed)
[address], [place], [place] [address]  [phone]      CARDIOLOGY [facility]     Date of  Admission: 11/25/2017  3:42 PM     Admission type:Emergency    Consult BJY:NWGNF failure, resp failure  Consult by: hospitalist      Subjective:     Brittney Shelton is a 67 y.o. female with PMH COPD, CHF, HTN, DM who was admitted for Acute respiratory failure with hypoxemia (HCC) [J96.01].    Per ED provider note Brittney Shelton presented to the ED with c/o worsening SOB and hypoxia.   Cardiology consulted for heart failure.  On assessment, Brittney Shelton endorses the above worsening SOB and DOE.  She was trying to see her PCP and the office called EMS to take her to the ED.  Brittney Shelton is feeling better since admission and on the hi flow NC she feels "ready to go home".  She denies pain, palpitations, increased swelling, recent illness.  She does feel chilled often at home.        Brittney Shelton  follows with Dr. Orvil Feil for cardiology.  Last ECHO was this admission with EF 61-65%; new RV dilation and decreased systolic function; severe TR; severe PHTN.     Stress 04/17 low risk      Cardiac risk factors: diabetes mellitus, obesity, sedentary life style, hypertension, post-menopausal.      Patient Active Problem List    Diagnosis Date Noted   ??? Cor pulmonale (HCC) 10/22/2017   ??? Pulmonary hypertension (HCC) 10/22/2017   ??? Acute respiratory failure with hypoxemia (HCC) 11/04/2017   ??? Acute respiratory failure with hypoxia (HCC) 03/12/2017   ??? Angioedema 01/06/2017   ??? 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 Ambulatory dysfunction 12/18/2016   ??? Intractable back pain 12/17/2016   ??? 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 Obesity, morbid (HCC) 05/21/2016   ??? Acute on chronic diastolic (congestive) heart failure (HCC) 01/23/2016   ??? Obesity 07/27/2015   ??? Requires continuous at home supplemental oxygen 07/27/2015    ??? Dependence on supplemental oxygen 07/27/2015   ??? ACP (advance care planning) 07/27/2015   ??? Counseling regarding advanced care planning and goals of care 07/27/2015   ??? Dyspnea 07/27/2015   ??? COPD (chronic obstructive pulmonary disease) (HCC) 07/26/2015   ??? Well controlled type 2 diabetes mellitus (HCC) 09/29/2014   ??? Hypercholesterolemia 09/29/2014   ??? 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 Chronic acquired lymphedema 09/29/2014      Brittney Rasher, MD  Past Medical History:   Diagnosis Date   ??? Chronic obstructive pulmonary disease (HCC)    ??? Congestive heart failure (HCC)    ??? Diabetes (HCC)    ??? Hypertension       Social History     Socioeconomic History   ??? Marital status: SINGLE     Spouse name: Not on file   ??? Number of children: Not on file   ??? Years of education: Not on file   ??? Highest education level: Not on file   Tobacco Use   ??? Smoking status: Never Smoker   ??? Smokeless tobacco: Never Used   Substance and Sexual Activity   ??? Alcohol use: No     Alcohol/week: 0.0 oz   ??? Drug use: No   ??? Sexual activity: Never     Allergies   Allergen Reactions   ???  Lisinopril Angioedema      Family History   Problem Relation Age of Onset   ??? Diabetes Mother    ??? Arthritis-osteo Father    ??? COPD Sister    ??? Diabetes Brother    ??? Diabetes Brother       Current Facility-Administered Medications   Medication Dose Route Frequency   ??? 0.9% sodium chloride infusion  100 mL/hr IntraVENous CONTINUOUS   ??? sodium chloride (NS) flush 5-40 mL  5-40 mL IntraVENous Q8H   ??? sodium chloride (NS) flush 5-40 mL  5-40 mL IntraVENous PRN   ??? acetaminophen (TYLENOL) tablet 650 mg  650 mg Oral Q6H PRN   ??? naloxone (NARCAN) injection 0.4 mg  0.4 mg IntraVENous PRN   ??? ondansetron (ZOFRAN) injection 4 mg  4 mg IntraVENous Q4H PRN   ??? bisacodyl (DULCOLAX) suppository 10 mg  10 mg Rectal DAILY PRN   ??? heparin (porcine) injection 7,500 Units  7,500 Units SubCUTAneous Q8H    ??? methylPREDNISolone (PF) (SOLU-MEDROL) injection 40 mg  40 mg IntraVENous Q6H   ??? albuterol-ipratropium (DUO-NEB) 2.5 MG-0.5 MG/3 ML  3 mL Nebulization Q4H RT   ??? aspirin delayed-release tablet 81 mg  81 mg Oral DAILY   ??? pravastatin (PRAVACHOL) tablet 20 mg  20 mg Oral QHS   ??? insulin lispro (HUMALOG) injection   SubCUTAneous AC&HS   ??? glucose chewable tablet 16 g  4 Tab Oral PRN   ??? dextrose (D50W) injection syrg 12.5-25 g  12.5-25 g IntraVENous PRN   ??? glucagon (GLUCAGEN) injection 1 mg  1 mg IntraMUSCular PRN   ??? metoprolol succinate (TOPROL-XL) XL tablet 50 mg  50 mg Oral DAILY   ??? levoFLOXacin (LEVAQUIN) 750 mg in D5W IVPB  750 mg IntraVENous Q48H        Review of Symptoms:   11 systems reviewed, negative other than as stated in the HPI        Objective:      Visit Vitals  BP 135/75   Pulse 69   Temp 98.9 ??F (37.2 ??C)   Resp 17   Ht  (1.676 m)   Wt 119.7 kg (264 lb)   SpO2 92%   BMI 42.61 kg/m??       Physical:   General: pleasant, obese, AAF resting in bed in NAD.  Slightly SOB when speaking.   Heart: RRR, no m/S3/JVD  Lungs: slightly coarse.  On hi flow NC  Abdomen: Soft, +BS, NTND   Extremities: LE bil +DP/PT, chronic lymphedema LLE.  No signs of pitting edema.    Neurologic: Grossly normal    Data Review:   Recent Labs     10/22/17  0605 10/04/2017  1635   WBC 5.1 8.4   HGB 11.9 12.7   HCT 39.2 41.8   PLT 261 310     Recent Labs     10/22/17  0605 10/24/2017  1719   NA 137 137   K 5.5* 5.0   CL 106 105   CO2 25 24   GLU 253* 119*   BUN 54* 55*   CREA 1.62* 1.61*   CA 7.9* 8.1*   MG  --  2.5*   ALB 3.2* 3.5   TBILI 0.8 1.1*   SGOT 14* 21   ALT 34 40       Recent Labs     10/22/17  0605 10/28/2017  1719   TROIQ <0.05 <0.05  Intake/Output Summary (Last 24 hours) at 10/22/2017 1156  Last data filed at 10/22/2017 1115  Gross per 24 hour   Intake ???   Output 1145 ml   Net -1145 ml        Cardiographics    Telemetry: SR  ECG: NSR; similar to prior    Echocardiogram: EF 61-65%; new RV dilation and decreased systolic function; severe TR; severe PHTN. PASP 96.6  CXRAY:"Technical factors as above with cardiomegaly and right perihilar/lower lobe airspace disease and possible effusion. Recommend PA and lateral views when Possible."  CTA chest: "1. No evidence pulmonary embolism.   2. Small right basilar pleural effusion and right basilar compressive  atelectasis.  3. Cardiomegaly."       Assessment:       Active Problems:    COPD (chronic obstructive pulmonary disease) (HCC) (07/26/2015)      Acute respiratory failure with hypoxemia (HCC) (10/17/2017)      Cor pulmonale (HCC) (10/22/2017)      Pulmonary hypertension (HCC) (10/22/2017)         Plan:     ELFIDA SHIMADA is a 67 y.o. female who was admitted with Acute respiratory failure with hypoxemia (HCC) [J96.01]  Cardiology consulted for possible HF contribution.  Trop negative; proBNP 16372; creat 1.62; K 5.5.  CTA negative for PE.    ?? ECHO shows cor pulmonale with EF 61-65% and RV failure with severe pulmonary hypertension.  PASP 96.6.    ?? RHC could be helpful, but would need to be at [facility] for nitric oxide.    ?? Could benefit from seeing pulmonary hypertension specialist.  Dr. Fraser Din is at [facility]    ?? Does not appear to be in left heart failure.  Elevated proBNP could be all related to RV failure.  IVC was dilated on ECHO, however.    ?? Patient's home lasix on hold and receiving IVF.  Will need to closely monitor due to her diastolic HF history.    ?? Try to prevent hypotension.    ?? Pulmonary meds or testing per pulmonary          Thank you for consulting [facility]    [name], NP  DNP, RN, AGACNP-BC

## 2017-10-22 NOTE — Progress Notes (Signed)
Hospitalist Progress Note  NAME: QUANNA WITTKE   DOB:  06-17-1950   MRN:  [id number]       Assessment / Plan:  Acute on chronic respiratory failure with hypoxia POA  Acute on chronic diastolic CHF POA, Cor pulmonale POA  ? Right lower lobe pneumonia  ? COPD exacerbation POA  Worsening DOE and o2 requirements past week; patient with some improvement overnight  On Levaquin for possible pneumonia, but PCT low and CT suggesting more compressive atelectasis rather than pneumonia. Low threshold to discontinue Levaquin in AM if she is stable overnight.   Reducing IVF as she is normotensive  IV steroids and nebs  Continue support with high-flow nasal cannula  ??  Acute kidney injury POA BUN/creat 55 and 1.61, unchanged overnight  Cardiorenal syndrome?  Renal US -- cancelled?  ??  Essential HTN POA  Normotensive now; holding Norvasc  Home diuretics on hold  ??  DM type 2 POA  Diet controlled at home  Last HgBa1c 6.4 in Dec 2018  Hyperglycemic on steroids -- I am going to increase insulin to "resistant" regimen, may need to add Lantus  ??  Right pleural effusion POA  ??  Remote tobacco use quit 20 years ago  ??  40 or above Morbid obesity / Body mass index is 42.61 kg/m??.    Code status: Full  Prophylaxis: Hep SQ  Recommended Disposition: TBD     Subjective:     Chief Complaint / Reason for Physician Visit: follow up acute respiratory failure  Patient denies complaints. Feels she is breathing much more easily compared to yesterday. Transitioned from BiPAP to HFNC.    Review of Systems:  Symptom Y/N Comments  Symptom Y/N Comments   Fever/Chills    Chest Pain     Poor Appetite    Edema     Cough    Abdominal Pain     Sputum    Joint Pain     SOB/DOE    Pruritis/Rash     Nausea/vomit    Tolerating PT/OT     Diarrhea    Tolerating Diet     Constipation    Other       Could NOT obtain due to:      Objective:     VITALS:   Last 24hrs VS reviewed since prior progress note. Most recent are:  Patient Vitals for the past 24 hrs:    Temp Pulse Resp BP SpO2   10/22/17 1600 97.9 ??F (36.6 ??C) 71 24 116/75 91 %   10/22/17 1525 ??? ??? ??? ??? 91 %   10/22/17 1500 ??? 69 23 126/76 91 %   10/22/17 1400 ??? 73 21 111/79 92 %   10/22/17 1300 ??? 71 21 114/62 91 %   10/22/17 1200 98.9 ??F (37.2 ??C) 73 22 147/81 95 %   10/22/17 1136 ??? ??? ??? ??? 92 %   10/22/17 1100 ??? 69 17 135/75 90 %   10/22/17 1000 ??? 71 19 139/80 92 %   10/22/17 0915 ??? ??? ??? 120/66 ???   10/22/17 0900 ??? 70 20 137/68 96 %   10/22/17 0836 ??? ??? ??? ??? 91 %   10/22/17 0800 98.9 ??F (37.2 ??C) 71 20 (!) 114/27 91 %   10/22/17 0700 ??? 73 20 120/66 95 %   10/22/17 0600 ??? 72 23 111/48 93 %   10/22/17 0500 ??? 72 18 114/59 94 %   10/22/17 0400 ??? 70 21 115/62 99 %  10/22/17 0331 98.2 ??F (36.8 ??C) 70 19 129/45 92 %   10/22/17 0304 ??? ??? ??? ??? 95 %   10/22/17 0052 ??? ??? ??? ??? (!) 79 %   10/22/17 0002 ??? ??? ??? ??? (!) 89 %   10/25/2017 2337 ??? 71 ??? 113/68 ???   10/03/2017 2233 97.6 ??F (36.4 ??C) 72 18 98/68 93 %   10/06/2017 2149 ??? 76 17 ??? 94 %   10/06/2017 2145 ??? 78 20 128/67 (!) 84 %   10/12/2017 1830 ??? 70 22 107/66 92 %   10/22/2017 1745 ??? 71 24 123/78 92 %       Intake/Output Summary (Last 24 hours) at 10/22/2017 1622  Last data filed at 10/22/2017 1115  Gross per 24 hour   Intake ???   Output 1145 ml   Net -1145 ml        PHYSICAL EXAM:  General: WD, WN. Alert, cooperative, no acute distress????  EENT:  EOMI. Anicteric sclerae. MMM  Resp:  Limited exam secondary to habitus, basilar rales present  CV:  Regular  Rhythm,??++edema  GI:  Soft, Non distended, Non tender. ??+Bowel sounds  Neurologic:?? Alert and oriented X 3, normal speech,   Psych:???? Good insight.??Not anxious nor agitated  Skin:  No rashes.  No jaundice    Reviewed most current lab test results and cultures  YES  Reviewed most current radiology test results   YES  Review and summation of old records today    NO  Reviewed patient's current orders and MAR    YES  PMH/SH reviewed - no change compared to H&P  ________________________________________________________________________   Care Plan discussed with:    Comments   Patient x    Family      RN     Care Manager     Consultant                        Multidiciplinary team rounds were held today with case manager, nursing, pharmacist and clinical coordinator.  Patient's plan of care was discussed; medications were reviewed and discharge planning was addressed.     ________________________________________________________________________  Total NON critical care TIME:  20   Minutes    Total CRITICAL CARE TIME Spent:   Minutes non procedure based      Comments   >50% of visit spent in counseling and coordination of care     ________________________________________________________________________  Margie Ege, MD     Procedures: see electronic medical records for all procedures/Xrays and details which were not copied into this note but were reviewed prior to creation of Plan.      LABS:  I reviewed today's most current labs and imaging studies.  Pertinent labs include:  Recent Labs     10/22/17  0605 10/23/2017  1635   WBC 5.1 8.4   HGB 11.9 12.7   HCT 39.2 41.8   PLT 261 310     Recent Labs     10/22/17  0605 10/17/2017  1719   NA 137 137   K 5.5* 5.0   CL 106 105   CO2 25 24   GLU 253* 119*   BUN 54* 55*   CREA 1.62* 1.61*   CA 7.9* 8.1*   MG  --  2.5*   ALB 3.2* 3.5   TBILI 0.8 1.1*   SGOT 14* 21   ALT 34 40       Signed: Margie Ege,  MD

## 2017-10-22 NOTE — Other (Signed)
Notified of slight worsening of ABG, though nursing reports that currently O2 is 97%  - current ABG: 7.25/55/78  - ABG on admission: 7.28/47/83    Pt continues to have tenuous respiratory status despite BiPAP support, will transfer to ICU as she is at risk for needing intubation. Repeat ABG at 0330.

## 2017-10-22 NOTE — Progress Notes (Signed)
[facility]  Name: Brittney Shelton MRN: [id number]   DOB: 11/25/1950 Hospital: [facility]   Date: 10/22/2017        IMPRESSION:   ?? Acute/chronic hypoxic/hypercapnic respiratory failure  ?? Community acquired pneumonia  ?? "COPD exacerbation" - she has a diagnosis of COPD, but she has no obstruction on PFTs; patient of Dr. Jayme Cloud  ?? Acute renal failure  ?? Pulmonary HTN  ?? DM/hyperglycemia  ?? Obesity - ?OSA/OHS      PLAN:   ?? NIV support for now - adjusted for patient comfort  ?? Bronchodilators  ?? Empiric antibiotics   ?? IV fluids  ?? Check procalcitonin  ?? Cultures  ?? Repeat echo  ?? May need PH evaluation, including sleep study if echo confirms pulmonary HTN  ?? Monitor creatinine  ?? DVT prophyaxis     Subjective/Interval History:   I have reviewed the flowsheet and previous day???s notes.  The patient is unable to give any meaningful history or review of systems because the patient is:  On BiPAP, patient on home O2, had worsening hypoxia and presented to the ER. Now on BiPAP due to acidosis, so history from patient is limited.  She tells me "I'm ready to go home".     The patient is critically ill on:      BiPAP     Review of Systems   Unable to perform ROS: Acuity of condition     Objective:  Vital Signs:    Visit Vitals  BP 111/48   Pulse 72   Temp 98.2 ??F (36.8 ??C)   Resp 23   Ht  (1.676 m)   Wt 129.1 kg (284 lb 9.8 oz)   SpO2 93%   BMI 45.94 kg/m??       O2 Device: BIPAP   O2 Flow Rate (L/min): 12 l/min   Temp (24hrs), Avg:97.4 ??F (36.3 ??C), Min:96.3 ??F (35.7 ??C), Max:98.2 ??F (36.8 ??C)     Intake/Output:   Last shift:      No intake/output data recorded.  Last 3 shifts: 05/21 1901 - 05/23 0700  In: -   Out: 675 [Urine:675]    Intake/Output Summary (Last 24 hours) at 10/22/2017 0717  Last data filed at 10/22/2017 0600  Gross per 24 hour   Intake ???   Output 675 ml   Net -675 ml     Hemodynamics:   PAP:   CO:     Wedge:   CI:      CVP:    SVR:       PVR:       Ventilator Settings:  Mode Rate Tidal Volume Pressure FiO2 PEEP            100 %       Peak airway pressure:      Minute ventilation: 9.3 l/min       Physical Exam   Constitutional: No distress. Face mask in place.   HENT:   Head: Normocephalic and atraumatic.   Eyes: No scleral icterus.   Cardiovascular: Normal rate and regular rhythm.   Pulmonary/Chest: She has no wheezes. She has rales in the right lower field.   Abdominal: Soft. Bowel sounds are normal. She exhibits no distension. There is no tenderness.   Musculoskeletal: She exhibits edema.   Neurological: She is alert.   Skin: Skin is warm and dry.     Data:     Current Facility-Administered  Medications   Medication Dose Route Frequency   ??? metroNIDAZOLE (FLAGYL) IVPB premix 500 mg  500 mg IntraVENous Q12H   ??? sodium chloride (NS) flush 5-40 mL  5-40 mL IntraVENous Q8H   ??? heparin (porcine) injection 7,500 Units  7,500 Units SubCUTAneous Q8H   ??? methylPREDNISolone (PF) (SOLU-MEDROL) injection 40 mg  40 mg IntraVENous Q6H   ??? albuterol-ipratropium (DUO-NEB) 2.5 MG-0.5 MG/3 ML  3 mL Nebulization Q4H RT   ??? aspirin delayed-release tablet 81 mg  81 mg Oral DAILY   ??? pravastatin (PRAVACHOL) tablet 20 mg  20 mg Oral QHS   ??? spironolactone (ALDACTONE) tablet 50 mg  50 mg Oral DAILY   ??? furosemide (LASIX) injection 40 mg  40 mg IntraVENous BID   ??? insulin lispro (HUMALOG) injection   SubCUTAneous AC&HS   ??? metoprolol succinate (TOPROL-XL) XL tablet 50 mg  50 mg Oral DAILY   ??? levoFLOXacin (LEVAQUIN) 750 mg in D5W IVPB  750 mg IntraVENous Q48H                Labs:  Recent Labs     10/22/17  0605 10/01/2017  1635   WBC 5.1 8.4   HGB 11.9 12.7   HCT 39.2 41.8   PLT 261 310     Recent Labs     10/22/17  0605 10/19/2017  1719   NA 137 137   K 5.5* 5.0   CL 106 105   CO2 25 24   GLU 253* 119*   BUN 54* 55*   CREA 1.62* 1.61*   CA 7.9* 8.1*   MG  --  2.5*   ALB 3.2* 3.5   TBILI 0.8 1.1*   SGOT 14* 21   ALT 34 40     Recent Labs     10/22/17   0357 10/22/17  0149 10/20/2017  1702   PHI 7.238* 7.254* 7.284*   PCO2I 55.4* 55.6* 46.9*   PO2I 114* 78* 83   HCO3I 23.6 24.6 22.2   FIO2I 100 100  --      Imaging:  I have personally reviewed the patient???s radiographs and have reviewed the reports:  right lower lobe infiltrate vs atelectasis, small right effusion         Total critical care time exclusive of procedures: 25 minutes  Lendon Ka, MD

## 2017-10-22 NOTE — Progress Notes (Signed)
Orders received, chart reviewed. Spoke to RN who reports that pt is not appropriate for participation in PT evaluation secondary to respiratory status. Will defer however continue to follow. Thank you    Demetrio Lapping, PT, DPT

## 2017-10-22 NOTE — Progress Notes (Addendum)
0820  Pt becoming restless and having difficulty tolerating BIPAP.  Dr. Rivka Spring called to bedside to assess pt.  Decision made to place pt on high flow O2.  0835  RT at bedside and pt placed on high flow O2 100%.  O2 sats initially drifted down to mid 80's, but pt began to recover and maintain sats.  Pt more comfortable on high flow.  D/w diet with Dr. Yetta Barre.  Will keep pt NPO with sips clears for now.  0920  Pt talkative and maintaining O2 sats low 90's on high flow.  ABG to be drawn at 0930  0945  ABG results reviewed with Dr. Rivka Spring.  Ok to leave pt on high flow O2 for now.  Keep pt on clear liquid diet for now.  1530  Pt tolerating high flow all day and doing very well.  She says she feels much better. Spoke with Dr. Yetta Barre regarding pt requesting food.  Ok to give pt light diet.

## 2017-10-22 NOTE — Other (Signed)
DTC Progress Note    Recommendations/ Comments: Pt discussed with rounding team and Dr. Yetta Barre.  Plan to monitor BG at this time and check a current a1c.  Correctional insulin ordered this am.  BG elevated on steroids.  If BG continues to remain over 180 then recommend beginning NPH 8 units Q6hrs linked and timed with solu-medrol.    Chart reviewed on [name] during Multidisciplinary Rounds.      A1c:   Lab Results   Component Value Date/Time    Hemoglobin A1c 6.4 (H) 05/18/2017 01:50 PM           Recent Glucose Results:   Lab Results   Component Value Date/Time    GLU 253 (H) 10/22/2017 06:05 AM    GLU 119 (H) 2017/11/08 05:19 PM    GLUCPOC 240 (H) 10/22/2017 08:43 AM        Lab Results   Component Value Date/Time    Creatinine 1.62 (H) 10/22/2017 06:05 AM     Estimated Creatinine Clearance: 43.8 mL/min (A) (based on SCr of 1.62 mg/dL (H)).    Active Orders   Diet    DIET DIABETIC CONSISTENT CARB Regular    DIET NPO        PO intake: No data found.    Will continue to follow as needed.    Thank you.  Loma Boston, BSN, RN, CDE  [facility]          Time spent: 5 min

## 2017-10-22 NOTE — Progress Notes (Addendum)
1610 Patient arrived to CCU.    Primary Nurse Lorre Nick, RN and Rubbie Battiest, RN performed a dual skin assessment on this patient No impairment noted  Braden score is 18    Assessment complete. Patient is alert and oriented. Resting comfortably in bed. No c/o pain at this time. Lung sounds are diminished throughout. Patient is on BIPAP 18/12 100% FIO2. Bowel sounds are active. Foley catheter placed. 2+ edema noted to lower extremities. PIV x2     0400 consulted pulmonology.    9604 Spoke with Dr. Liam Graham. Updated on patient condition and ABG results. Received new orders to decrease Epap to 6.    0700 Report given to Curt Bears, RN

## 2017-10-22 NOTE — Progress Notes (Addendum)
1900 Report received from Curt Bears, RN    2000 Assessment complete. Patient alert and oriented. Resting comfortably in bed. No c/o pain at this time. Lung sounds are diminished throughout. Patient is on Hi Flow nasal cannula @ 100%. Bowel sounds are active. Pulses are palpable. 2+ edema to lower extremities. PIV x2 with Normal Saline infusing @ 4mL/hr. Foley catheter draining clear yellow urine.     2241 Patient vomited of emesis. Zofran given per orders.    2355 Patient vomited 50mL of emesis. Patient c/o "not feeling good".    0000 Reassessment complete. No changes from previous.    0400 Reassessment complete. No changes from previous.    0700 Report given to Sammuel Bailiff, RN

## 2017-10-23 ENCOUNTER — Inpatient Hospital Stay: Admit: 2017-10-23 | Payer: MEDICARE | Primary: Internal Medicine

## 2017-10-23 LAB — URINALYSIS W/ REFLEX CULTURE
Bacteria: NEGATIVE /hpf
Bilirubin: NEGATIVE
Glucose: NEGATIVE mg/dL
Ketone: NEGATIVE mg/dL
Leukocyte Esterase: NEGATIVE
Nitrites: NEGATIVE
Protein: NEGATIVE mg/dL
Specific gravity: 1.01 (ref 1.003–1.030)
Urobilinogen: 0.2 EU/dL (ref 0.2–1.0)
pH (UA): 5 (ref 5.0–8.0)

## 2017-10-23 LAB — METABOLIC PANEL, COMPREHENSIVE
A-G Ratio: 0.8 — ABNORMAL LOW (ref 1.1–2.2)
ALT (SGPT): 26 U/L (ref 12–78)
AST (SGOT): 11 U/L — ABNORMAL LOW (ref 15–37)
Albumin: 3.2 g/dL — ABNORMAL LOW (ref 3.5–5.0)
Alk. phosphatase: 90 U/L (ref 45–117)
Anion gap: 6 mmol/L (ref 5–15)
BUN/Creatinine ratio: 35 — ABNORMAL HIGH (ref 12–20)
BUN: 51 MG/DL — ABNORMAL HIGH (ref 6–20)
Bilirubin, total: 1 MG/DL (ref 0.2–1.0)
CO2: 25 mmol/L (ref 21–32)
Calcium: 8 MG/DL — ABNORMAL LOW (ref 8.5–10.1)
Chloride: 106 mmol/L (ref 97–108)
Creatinine: 1.46 MG/DL — ABNORMAL HIGH (ref 0.55–1.02)
GFR est AA: 43 mL/min/{1.73_m2} — ABNORMAL LOW (ref >60)
GFR est non-AA: 36 mL/min/{1.73_m2} — ABNORMAL LOW (ref >60)
Globulin: 4.2 g/dL — ABNORMAL HIGH (ref 2.0–4.0)
Glucose: 217 mg/dL — ABNORMAL HIGH (ref 65–100)
Potassium: 5.4 mmol/L — ABNORMAL HIGH (ref 3.5–5.1)
Protein, total: 7.4 g/dL (ref 6.4–8.2)
Sodium: 137 mmol/L (ref 136–145)

## 2017-10-23 LAB — CBC WITH AUTOMATED DIFF
ABS. BASOPHILS: 0 10*3/uL (ref 0.0–0.1)
ABS. EOSINOPHILS: 0 10*3/uL (ref 0.0–0.4)
ABS. IMM. GRANS.: 0 10*3/uL (ref 0.00–0.04)
ABS. LYMPHOCYTES: 0.5 10*3/uL — ABNORMAL LOW (ref 0.8–3.5)
ABS. MONOCYTES: 0.6 10*3/uL (ref 0.0–1.0)
ABS. NEUTROPHILS: 6.5 10*3/uL (ref 1.8–8.0)
ABSOLUTE NRBC: 0.1 10*3/uL — ABNORMAL HIGH (ref 0.00–0.01)
BASOPHILS: 0 % (ref 0–1)
EOSINOPHILS: 0 % (ref 0–7)
HCT: 39 % (ref 35.0–47.0)
HGB: 11.9 g/dL (ref 11.5–16.0)
IMMATURE GRANULOCYTES: 1 % — ABNORMAL HIGH (ref 0.0–0.5)
LYMPHOCYTES: 7 % — ABNORMAL LOW (ref 12–49)
MCH: 27.3 PG (ref 26.0–34.0)
MCHC: 30.5 g/dL (ref 30.0–36.5)
MCV: 89.4 FL (ref 80.0–99.0)
MONOCYTES: 8 % (ref 5–13)
MPV: 11.5 FL (ref 8.9–12.9)
NEUTROPHILS: 85 % — ABNORMAL HIGH (ref 32–75)
NRBC: 1.3 PER 100 WBC — ABNORMAL HIGH
PLATELET: 256 10*3/uL (ref 150–400)
RBC: 4.36 M/uL (ref 3.80–5.20)
RDW: 17.3 % — ABNORMAL HIGH (ref 11.5–14.5)
WBC: 7.6 10*3/uL (ref 3.6–11.0)

## 2017-10-23 LAB — PHOSPHORUS: Phosphorus: 4.1 MG/DL (ref 2.6–4.7)

## 2017-10-23 LAB — HEMOGLOBIN A1C WITH EAG
Est. average glucose: 137 mg/dL
Hemoglobin A1c: 6.4 % — ABNORMAL HIGH (ref 4.2–6.3)

## 2017-10-23 LAB — GLUCOSE, POC
Glucose (POC): 210 mg/dL — ABNORMAL HIGH (ref 65–100)
Glucose (POC): 215 mg/dL — ABNORMAL HIGH (ref 65–100)
Glucose (POC): 133 mg/dL — ABNORMAL HIGH (ref 65–100)

## 2017-10-23 LAB — MAGNESIUM: Magnesium: 2.4 mg/dL (ref 1.6–2.4)

## 2017-10-23 MED ORDER — METHYLPREDNISOLONE (PF) 40 MG/ML IJ SOLR
40 mg/mL | Freq: Two times a day (BID) | INTRAMUSCULAR | Status: DC
Start: 2017-10-23 — End: 2017-10-24
  Administered 2017-10-23 – 2017-10-24 (×2): via INTRAVENOUS

## 2017-10-23 MED ORDER — INSULIN GLARGINE 100 UNIT/ML INJECTION
100 unit/mL | Freq: Every evening | SUBCUTANEOUS | Status: DC
Start: 2017-10-23 — End: 2017-10-27
  Administered 2017-10-24 – 2017-10-25 (×2): via SUBCUTANEOUS

## 2017-10-23 MED ORDER — DEXTROSE 10% IN WATER (D10W) IV
10 % | INTRAVENOUS | Status: DC | PRN
Start: 2017-10-23 — End: 2017-11-10

## 2017-10-23 MED ORDER — FUROSEMIDE 10 MG/ML IJ SOLN
10 mg/mL | Freq: Two times a day (BID) | INTRAMUSCULAR | Status: AC
Start: 2017-10-23 — End: 2017-10-23
  Administered 2017-10-23 – 2017-10-24 (×2): via INTRAVENOUS

## 2017-10-23 MED FILL — PRAVASTATIN 10 MG TAB: 10 mg | ORAL | Qty: 2

## 2017-10-23 MED FILL — INSULIN LISPRO 100 UNIT/ML INJECTION: 100 unit/mL | SUBCUTANEOUS | Qty: 1

## 2017-10-23 MED FILL — TOPROL XL 25 MG TABLET,EXTENDED RELEASE: 25 mg | ORAL | Qty: 2

## 2017-10-23 MED FILL — SOLU-MEDROL (PF) 40 MG/ML SOLUTION FOR INJECTION: 40 mg/mL | INTRAMUSCULAR | Qty: 1

## 2017-10-23 MED FILL — IPRATROPIUM-ALBUTEROL 2.5 MG-0.5 MG/3 ML NEB SOLUTION: 2.5 mg-0.5 mg/3 ml | RESPIRATORY_TRACT | Qty: 3

## 2017-10-23 MED FILL — HEPARIN (PORCINE) 5,000 UNIT/ML IJ SOLN: 5000 unit/mL | INTRAMUSCULAR | Qty: 2

## 2017-10-23 MED FILL — ASPIRIN 81 MG TAB, DELAYED RELEASE: 81 mg | ORAL | Qty: 1

## 2017-10-23 MED FILL — BD POSIFLUSH NORMAL SALINE 0.9 % INJECTION SYRINGE: INTRAMUSCULAR | Qty: 10

## 2017-10-23 MED FILL — BD POSIFLUSH NORMAL SALINE 0.9 % INJECTION SYRINGE: INTRAMUSCULAR | Qty: 40

## 2017-10-23 MED FILL — FUROSEMIDE 10 MG/ML IJ SOLN: 10 mg/mL | INTRAMUSCULAR | Qty: 6

## 2017-10-23 MED FILL — ONDANSETRON (PF) 4 MG/2 ML INJECTION: 4 mg/2 mL | INTRAMUSCULAR | Qty: 2

## 2017-10-23 NOTE — Progress Notes (Signed)
Hospitalist Progress Note  NAME: Brittney Shelton   DOB:  1950/06/17   MRN:  [id number]       Assessment / Plan:  Acute on chronic respiratory failure with hypoxia POA  Acute on chronic diastolic CHF POA, Cor pulmonale POA  ? Right lower lobe pneumonia  ? COPD exacerbation POA  Worsening DOE and o2 requirements past week; hypoxia remains severe and she is clinically tenuous at this time  On Levaquin for possible pneumonia, but PCT low and CT suggesting more compressive atelectasis rather than pneumonia. Given some instability, will continue Levaquin presently.   Discontinue IVF  IV steroids and nebs  Continue support with high-flow nasal cannula, BiPAP PRN  ??  Acute kidney injury POA BUN/creat 55 and 1.61, unchanged overnight  Cardiorenal syndrome? Starting diuretics today (should help) -- monitor.    Hyperkalemia, mild  Starting diuretics today -- anticipate that this should resolve  ??  Essential HTN POA  Near normotensive now; holding Norvasc  ??  DM type 2 POA  Diet controlled at home  Last HgBa1c 6.4 in Dec 2018  Hyperglycemic on steroids; will add low dose Lantus for tonight  ??  Right pleural effusion POA  ??  Remote tobacco use quit 20 years ago  ??  40 or above Morbid obesity / Body mass index is 42.61 kg/m??.    Code status: Full  Prophylaxis: Hep SQ  Recommended Disposition: TBD     Subjective:     Chief Complaint / Reason for Physician Visit: follow up acute respiratory failure  Required resumption of BiPAP. States she is feeling a little better. Denies fevers or chills. Vomited yesterday evening, no nausea now.    Review of Systems:  Symptom Y/N Comments  Symptom Y/N Comments   Fever/Chills n   Chest Pain n    Poor Appetite n   Edema y    Cough    Abdominal Pain n    Sputum    Joint Pain     SOB/DOE    Pruritis/Rash     Nausea/vomit    Tolerating PT/OT     Diarrhea n   Tolerating Diet     Constipation n   Other       Could NOT obtain due to:      Objective:     VITALS:    Last 24hrs VS reviewed since prior progress note. Most recent are:  Patient Vitals for the past 24 hrs:   Temp Pulse Resp BP SpO2   10/23/17 1548 98 ??F (36.7 ??C) ??? ??? ??? ???   10/23/17 1518 ??? ??? ??? ??? 90 %   10/23/17 1300 ??? 74 22 135/77 91 %   10/23/17 1200 97.7 ??F (36.5 ??C) 69 20 138/83 90 %   10/23/17 1126 ??? ??? ??? ??? 99 %   10/23/17 1100 ??? 75 19 138/82 94 %   10/23/17 1000 ??? 77 23 136/78 94 %   10/23/17 0946 ??? ??? ??? ??? 90 %   10/23/17 0900 ??? 73 20 122/90 90 %   10/23/17 0800 97.8 ??F (36.6 ??C) 64 19 126/72 93 %   10/23/17 0729 ??? ??? ??? ??? 93 %   10/23/17 0700 ??? 70 22 105/72 94 %   10/23/17 0600 ??? 67 20 131/81 92 %   10/23/17 0500 ??? 68 22 138/76 91 %   10/23/17 0400 98.6 ??F (37 ??C) 79 23 136/74 (!) 89 %   10/23/17 0300 ??? 71 21 127/83 Marland Kitchen)  88 %   10/23/17 0200 ??? 73 20 108/61 (!) 87 %   10/23/17 0119 ??? ??? ??? ??? 90 %   10/23/17 0100 ??? 72 22 124/71 (!) 87 %   10/23/17 0000 ??? 75 20 128/79 90 %   10/22/17 2300 ??? 69 20 128/68 (!) 89 %   10/22/17 2200 ??? 72 21 126/72 (!) 88 %   10/22/17 2100 ??? 75 19 126/76 91 %   10/22/17 2000 98.2 ??F (36.8 ??C) 76 25 123/70 ???   10/22/17 1900 ??? 73 26 129/75 94 %   10/22/17 1800 ??? 71 21 135/78 (!) 89 %   10/22/17 1700 ??? 68 22 125/78 93 %   10/22/17 1600 97.9 ??F (36.6 ??C) 71 24 116/75 91 %       Intake/Output Summary (Last 24 hours) at 10/23/2017 1558  Last data filed at 10/23/2017 1548  Gross per 24 hour   Intake 1805 ml   Output 1490 ml   Net 315 ml        PHYSICAL EXAM:  General: WD, WN. Alert, cooperative, no acute distress????  EENT:  EOMI. Anicteric sclerae. MMM  Resp:  Limited exam secondary to habitus, basilar rales present  CV:  Regular  Rhythm,??+edema  GI:  Soft, Non distended, Non tender. ??+Bowel sounds  Neurologic:?? Alert and oriented X 3, normal speech,   Psych:???? Good insight.??Not anxious nor agitated  Skin:  No rashes.  No jaundice    Reviewed most current lab test results and cultures  YES  Reviewed most current radiology test results   YES  Review and summation of old records today    NO   Reviewed patient's current orders and MAR    YES  PMH/SH reviewed - no change compared to H&P  ________________________________________________________________________  Care Plan discussed with:    Comments   Patient x    Family      RN     Care Manager     Consultant  x Dr. Sherre Scarlet                     Multidiciplinary team rounds were held today with case manager, nursing, pharmacist and clinical coordinator.  Patient's plan of care was discussed; medications were reviewed and discharge planning was addressed.     ________________________________________________________________________  Total NON critical care TIME:  20   Minutes    Total CRITICAL CARE TIME Spent:   Minutes non procedure based      Comments   >50% of visit spent in counseling and coordination of care     ________________________________________________________________________  Margie Ege, MD     Procedures: see electronic medical records for all procedures/Xrays and details which were not copied into this note but were reviewed prior to creation of Plan.      LABS:  I reviewed today's most current labs and imaging studies.  Pertinent labs include:  Recent Labs     10/23/17  0608 10/22/17  0605 10-25-2017  1635   WBC 7.6 5.1 8.4   HGB 11.9 11.9 12.7   HCT 39.0 39.2 41.8   PLT 256 261 310     Recent Labs     10/23/17  0608 10/22/17  0605 25-Oct-2017  1719   NA 137 137 137   K 5.4* 5.5* 5.0   CL 106 106 105   CO2 GLU 217* 253* 119*   BUN 51* 54* 55*   CREA  1.46* 1.62* 1.61*   CA 8.0* 7.9* 8.1*   MG 2.4  --  2.5*   PHOS 4.1  --   --    ALB 3.2* 3.2* 3.5   TBILI 1.0 0.8 1.1*   SGOT 11* 14* 21   ALT 26 34 40       Signed: Margie Ege, MD

## 2017-10-23 NOTE — Progress Notes (Signed)
[address], [place], [place] [address]  [phone]  Cardiology Progress Note      10/23/2017 1005 AM    Admit Date: 11-14-2017    Admit Diagnosis:   Acute respiratory failure with hypoxemia (HCC) [J96.01]    Subjective:     Brittney Shelton is a 67 y.o. female with PMH COPD, CHF, HTN, DM who was admitted for Acute respiratory failure with hypoxemia (HCC) [J96.01].    Overnight events:  -VSS except low oxygen sats  -creat down to 1.46; K 5.4  -no weight; I/O even  -Brittney Shelton is on bipap this morning.  She seems fatigued.  States her breathing is okay.  No pain now, but states she had abd pain all night.  She also states she has had n/v.      Visit Vitals  BP 138/82   Pulse 75   Temp 97.8 ??F (36.6 ??C)   Resp 19   Ht  (1.676 m)   Wt 119.7 kg (264 lb)   SpO2 94%   BMI 42.61 kg/m??       Current Facility-Administered Medications   Medication Dose Route Frequency   ??? methylPREDNISolone (PF) (SOLU-MEDROL) injection 40 mg  40 mg IntraVENous Q12H   ??? furosemide (LASIX) injection 60 mg  60 mg IntraVENous Q12H   ??? dextrose 10% infusion 125-250 mL  125-250 mL IntraVENous PRN   ??? 0.9% sodium chloride infusion  50 mL/hr IntraVENous CONTINUOUS   ??? insulin lispro (HUMALOG) injection   SubCUTAneous AC&HS   ??? sodium chloride (NS) flush 5-40 mL  5-40 mL IntraVENous Q8H   ??? sodium chloride (NS) flush 5-40 mL  5-40 mL IntraVENous PRN   ??? acetaminophen (TYLENOL) tablet 650 mg  650 mg Oral Q6H PRN   ??? naloxone (NARCAN) injection 0.4 mg  0.4 mg IntraVENous PRN   ??? ondansetron (ZOFRAN) injection 4 mg  4 mg IntraVENous Q4H PRN   ??? bisacodyl (DULCOLAX) suppository 10 mg  10 mg Rectal DAILY PRN   ??? heparin (porcine) injection 7,500 Units  7,500 Units SubCUTAneous Q8H   ??? albuterol-ipratropium (DUO-NEB) 2.5 MG-0.5 MG/3 ML  3 mL Nebulization Q4H RT   ??? aspirin delayed-release tablet 81 mg  81 mg Oral DAILY   ??? pravastatin (PRAVACHOL) tablet 20 mg  20 mg Oral QHS   ??? glucose chewable tablet 16 g  4 Tab Oral PRN    ??? glucagon (GLUCAGEN) injection 1 mg  1 mg IntraMUSCular PRN   ??? metoprolol succinate (TOPROL-XL) XL tablet 50 mg  50 mg Oral DAILY   ??? levoFLOXacin (LEVAQUIN) 750 mg in D5W IVPB  750 mg IntraVENous Q48H       Objective:      Physical Exam:  General: drowsy, obese, AAF resting in bed in NAD.    Heart: RRR, no m/S3/JVD  Lungs: clear;  On bipap  Abdomen: Soft, +BS, NTND   Extremities: LE bil +DP/PT, chronic lymphedema LLE.  ? Trace edema RLE  Neurologic: Grossly normal  Skin:?? Warm and dry.??    Data Review:   Recent Labs     10/23/17  0608 10/22/17  0605 Nov 14, 2017  1635   WBC 7.6 5.1 8.4   HGB 11.9 11.9 12.7   HCT 39.0 39.2 41.8   PLT 256 261 310     Recent Labs     10/23/17  0608 10/22/17  0605 2017-11-14  1719   NA 137 137 137   K 5.4* 5.5* 5.0   CL 106  106 105   CO2 GLU 217* 253* 119*   BUN 51* 54* 55*   CREA 1.46* 1.62* 1.61*   CA 8.0* 7.9* 8.1*   MG 2.4  --  2.5*   PHOS 4.1  --   --    ALB 3.2* 3.2* 3.5   TBILI 1.0 0.8 1.1*   SGOT 11* 14* 21   ALT 26 34 40       Recent Labs     10/22/17  0605 November 03, 2017  1719   TROIQ <0.05 <0.05         Intake/Output Summary (Last 24 hours) at 10/23/2017 1127  Last data filed at 10/23/2017 1000  Gross per 24 hour   Intake 2083.33 ml   Output 1110 ml   Net 973.33 ml        Telemetry: SR  ECG: NSR; similar to prior   Echocardiogram: EF 61-65%; new RV dilation and decreased systolic function; severe TR; severe PHTN. PASP 96.6  CXRAY:"Technical factors as above with cardiomegaly and right perihilar/lower lobe airspace disease and possible effusion. Recommend PA and lateral views when Possible."  CTA chest: "1. No evidence pulmonary embolism.   2. Small right basilar pleural effusion and right basilar compressive  atelectasis.  3. Cardiomegaly."        Assessment:     Active Problems:    COPD (chronic obstructive pulmonary disease) (HCC) (07/26/2015)      Acute respiratory failure with hypoxemia (HCC) (November 03, 2017)      Cor pulmonale (HCC) (10/22/2017)       Pulmonary hypertension (HCC) (10/22/2017)        Plan:     Cor Pulmonale 2/2 severe pulmonary HTN:  Likely stemming from patient's severe hypoxia and her COPD.  NOT 2/2 diastolic heart failure.  Patient not in acute heart failure and left atrium is not dilated on ECHO, which it would need to be for left heart pressures to be high enough to impact pulmonary HTN like patient's current values (PASP 96.6).  EF 61-65%.   ?? RHC could be helpful.  Nitric oxide only at [facility].  Would need to use other medications here if attempted.    ?? Recommend out patient evaluation with Dr. Fraser Din (pulmonary hypertension specialist).  When patient able, should also have repeated PFTS.    ?? Diuretics restarted by pulmonary and IVF stopped.  Will need to watch for dehydration with current n/v.  If patient proves able to take pills okay with her nausea, would recommend PO lasix at her home dose since she is not in failure.    ?? Try to prevent hypotension  ?? Pulmonary meds and bipap per pulmonary/hospitalist        [name], NP  DNP, RN, AGACNP-BC

## 2017-10-23 NOTE — Progress Notes (Addendum)
1230: Patient care transferred. Report received by Luvenia Redden, RN. Patient sleeping comfortably in bed on Bipap.     1540: Patient awake and alert. Bipap on standby and hiflow O2 placed.     1830: Patient sats 82-84% despite deep breathing exercises.Sats dropped to 77% during change to bipap. Sats increased 90% after bipap placed.     1900: Report given to [place], RN

## 2017-10-23 NOTE — Progress Notes (Signed)
Chart reviewed. Spoke to RN who reports that pt remains inappropriate for participation in PT evaluation secondary to respiratory status. Will defer however continue to follow. Thank you    Demetrio Lapping, PT, DPT

## 2017-10-23 NOTE — Progress Notes (Signed)
Critical care interdisciplinary rounds held on 05/24/20189.  Following members present, Pharmacy, Diabetes Treatment, Case Management, Respiratory Therapy and Nutrition.  Led by Nyra Jabs, RN and Dr. Yetta Barre and Dr. Rivka Spring.  Plan of care discussed.  See clinical pathway for plan of care and interventions and desired outcomes.  Not able to get out of bed due to poor saturations.

## 2017-10-23 NOTE — Other (Addendum)
0700 Bedside and Verbal shift change report received from [place], Museum/gallery conservator). Report included the following information SBAR, Kardex, Intake/Output, MAR, Accordion and Recent Results.     0800 Assessment complete. See flowsheet for details.    0945 O2 sats borderline; 89-90% on Hi-Flow NC. Patient placed back on BIPAP.     1200 Reassessment complete. See flowsheet for details.    1300 Bedside and Verbal shift change report given to [place], RN (oncoming nurse). Report included the following information SBAR, Kardex, Intake/Output, MAR, Accordion and Recent Results.

## 2017-10-23 NOTE — Progress Notes (Addendum)
[facility] Critical Care, and Sleep Medicine  Name: Brittney Shelton MRN: [id number]   DOB: 1950/07/25 Hospital: [facility]   Date: 10/23/2017        IMPRESSION:   ?? Acute/chronic hypoxic/hypercapnic respiratory failure  ?? Community acquired pneumonia  ?? "COPD exacerbation" - she has a diagnosis of COPD, but she has no obstruction on PFTs; patient of Dr. Jayme Cloud  ?? Acute renal failure  ?? Pulmonary HTN - likely due to diastolic heart failure, but can not r/o other causes  ?? LVH/diastolic heart failure  ?? DM/hyperglycemia  ?? Obesity - ?OSA/OHS      PLAN:   ?? High flow O2  ?? Bronchodilators  ?? Empiric antibiotics   ?? IV fluids  ?? Follow up cultures  ?? Diurese today  ?? Will eventually need PH evaluation, including sleep study   ?? Monitor creatinine  ?? Insulin   ?? DVT prophyaxis     Subjective/Interval History:   I have reviewed the flowsheet and previous day???s notes.  Episodes of nausea overnight.  Dyspnea improving, though still requiring high-flow O2.    Review of Systems   Constitutional: Positive for fatigue.   HENT: Negative.    Eyes: Negative.    Respiratory: Positive for shortness of breath.    Cardiovascular: Negative.    Gastrointestinal: Positive for nausea.     Objective:  Vital Signs:    Visit Vitals  BP 105/72   Pulse 70   Temp 98.6 ??F (37 ??C)   Resp 22   Ht  (1.676 m)   Wt 119.7 kg (264 lb)   SpO2 94%   BMI 42.61 kg/m??       O2 Device: Hi flow nasal cannula   O2 Flow Rate (L/min): 40 l/min   Temp (24hrs), Avg:98.5 ??F (36.9 ??C), Min:97.9 ??F (36.6 ??C), Max:98.9 ??F (37.2 ??C)     Intake/Output:   Last shift:      No intake/output data recorded.  Last 3 shifts: 05/22 1901 - 05/24 0700  In: 1393.3 [I.V.:1393.3]  Out: 2025 [Urine:1875]    Intake/Output Summary (Last 24 hours) at 10/23/2017 0720  Last data filed at 10/23/2017 0600  Gross per 24 hour   Intake 1393.33 ml   Output 1350 ml   Net 43.33 ml     Hemodynamics:   PAP:   CO:     Wedge:   CI:      CVP:    SVR:       PVR:       Ventilator Settings:  Mode Rate Tidal Volume Pressure FiO2 PEEP            100 %       Peak airway pressure:      Minute ventilation: 9.3 l/min       Physical Exam   Constitutional: No distress. Face mask in place.   HENT:   Head: Normocephalic and atraumatic.   Eyes: No scleral icterus.   Cardiovascular: Normal rate and regular rhythm.   Pulmonary/Chest: She has no wheezes. She has rales in the right lower field.   Abdominal: Soft. Bowel sounds are normal. She exhibits no distension. There is no tenderness.   Musculoskeletal: She exhibits edema.   Neurological: She is alert.   Skin: Skin is warm and dry.     Data:     Current Facility-Administered Medications   Medication Dose Route Frequency   ??? 0.9% sodium chloride infusion  50 mL/hr IntraVENous  CONTINUOUS   ??? insulin lispro (HUMALOG) injection   SubCUTAneous AC&HS   ??? sodium chloride (NS) flush 5-40 mL  5-40 mL IntraVENous Q8H   ??? heparin (porcine) injection 7,500 Units  7,500 Units SubCUTAneous Q8H   ??? methylPREDNISolone (PF) (SOLU-MEDROL) injection 40 mg  40 mg IntraVENous Q6H   ??? albuterol-ipratropium (DUO-NEB) 2.5 MG-0.5 MG/3 ML  3 mL Nebulization Q4H RT   ??? aspirin delayed-release tablet 81 mg  81 mg Oral DAILY   ??? pravastatin (PRAVACHOL) tablet 20 mg  20 mg Oral QHS   ??? metoprolol succinate (TOPROL-XL) XL tablet 50 mg  50 mg Oral DAILY   ??? levoFLOXacin (LEVAQUIN) 750 mg in D5W IVPB  750 mg IntraVENous Q48H                Labs:  Recent Labs     10/23/17  0608 10/22/17  0605 11-13-2017  1635   WBC 7.6 5.1 8.4   HGB 11.9 11.9 12.7   HCT 39.0 39.2 41.8   PLT 256 261 310     Recent Labs     10/23/17  0608 10/22/17  0605 November 13, 2017  1719   NA 137 137 137   K 5.4* 5.5* 5.0   CL 106 106 105   CO2 GLU 217* 253* 119*   BUN 51* 54* 55*   CREA 1.46* 1.62* 1.61*   CA 8.0* 7.9* 8.1*   MG 2.4  --  2.5*   PHOS 4.1  --   --    ALB 3.2* 3.2* 3.5   TBILI 1.0 0.8 1.1*   SGOT 11* 14* 21   ALT 26 34 40     Recent Labs     10/22/17   0936 10/22/17  0357 10/22/17  0149   PHI 7.321* 7.238* 7.254*   PCO2I 47.1* 55.4* 55.6*   PO2I 59* 114* 78*   HCO3I 24.3 23.6 24.6   FIO2I 100 100 100     Imaging:  I have personally reviewed the patient???s radiographs and have reviewed the reports:  Decreased right lower lobe infiltrate        Total critical care time exclusive of procedures:   minutes  Lendon Ka, MD

## 2017-10-23 NOTE — Progress Notes (Addendum)
1900 Report received from Leland Johns, RN    2000 Assessment complete. Patient is alert and oriented. Resting comfortably in bed. No c/o pain at this time. Lung sounds are diminished throughout. Patient is on BIPAP @ 80%. Bowel sounds are hypoactive. Pulses are palpable. 2+ edema in lower extremities. PIV x2 that are saline locked.    2055 patient placed on Hi Flow to eat a snack.    2200  Patient placed back on BIPAP. Oxygen sats 82%.    0000 Reassessment complete. No changes from previous.    0400 Reassessment complete. No changes from previous.    0500 Patient remained on BIPAP majority of the night.    0700 Report given to Leata Mouse, RN

## 2017-10-23 NOTE — Progress Notes (Addendum)
Reason for Admission:   Pt is a 68 yo female sent over from PCP office for evaluation/treatment of acute respiratory failure - sats were 75% on 4 L. Pt is being cared for in CCU - alternating between hi-flow 02 and BIPAP but has been unable to move around or participate in PT/OT due to 02 levels. On Levaquin for possible PNA but compressive atelectasis is more likely explanation. Bronchodilators; Diurese today.               RRAT Score:     33             Resources/supports as identified by patient/family:   Brother, sister-in-law and sister are supportive for transportation. Pt has both Medicare and Medicaid.                Top Challenges facing patient (as identified by patient/family and CM):                       Finances/Medication cost?      No issues              Transportation?  No issues              Support system or lack thereof?  No issues                     Living arrangements?     Pt lives w/ her brother in a 1-story home           Self-care/ADLs/Cognition?  Pt is modified independent for mobility with a RW and Home 02 ( 4 lpm  [employer]).          Current Advanced Directive/Advance Care Plan:  She is currently a Full Code. Adv Care Plan on file - pt's surrogate medical decision maker is her sister Chelsea Aus.     Pt would benefit from a Palliative Consult due to comorbidities and progression of disease process. Pt had several hospitalizations in 2018 from July to Nov.                          Plan for utilizing home health:    Pt had [facility] HH in 2018 after her dc from SNF [facility]) and [facility] Rehab ([facility]). If recommended at dc, she would like to use [facility] again. FOC signed-placed on bedside chart.                      Likelihood of readmission:  Moderate                 Transition of Care Plan:       Pt is an ACO Member/BSMG - NN Annamarie Major notified on admission via email.  Pt is under HF Bundle and is being followed by HUG program.     Awaiting medical stability for transfer to next level of care. Depending on outcome of PT/OT evals - will likely need HH Nursing/PT/OT orders vs SNF. (Doubtful that [employer] would authorize IP Rehab unless SNF not successful as in 2018).    Cardiology is following and has made recommendations for repeat LFTS and possible f/u with Dr. Phylis Bougie for pulmonary hypertension as outpt. Pulmonology has recommended sleep study as outpt.    Care Management Interventions  PCP Verified by CM: Yes  Palliative Care Criteria Met (RRAT>21 & CHF Dx)?: Yes  Palliative Consult Recommended?: Yes  Mode  of Transport at Discharge: Other (see comment)(family)  Transition of Care Consult (CM Consult): Discharge Planning(Pt was assessed for possible dc needs.)  Discharge Durable Medical Equipment: No  Physical Therapy Consult: Yes  Occupational Therapy Consult: Yes  Speech Therapy Consult: No  Current Support Network: Relative's Home  Confirm Follow Up Transport: Family  Plan discussed with Pt/Family/Caregiver: Yes  Freedom of Choice Offered: Yes  Discharge Location  Discharge Placement: Home with home health     Durwin Nora, MSW  [phone]

## 2017-10-24 ENCOUNTER — Inpatient Hospital Stay: Admit: 2017-10-24 | Payer: MEDICARE | Primary: Internal Medicine

## 2017-10-24 LAB — METABOLIC PANEL, COMPREHENSIVE
A-G Ratio: 0.8 — ABNORMAL LOW (ref 1.1–2.2)
ALT (SGPT): 24 U/L (ref 12–78)
AST (SGOT): 6 U/L — ABNORMAL LOW (ref 15–37)
Albumin: 3.2 g/dL — ABNORMAL LOW (ref 3.5–5.0)
Alk. phosphatase: 82 U/L (ref 45–117)
Anion gap: 6 mmol/L (ref 5–15)
BUN/Creatinine ratio: 34 — ABNORMAL HIGH (ref 12–20)
BUN: 54 MG/DL — ABNORMAL HIGH (ref 6–20)
Bilirubin, total: 0.9 MG/DL (ref 0.2–1.0)
CO2: 27 mmol/L (ref 21–32)
Calcium: 7.9 MG/DL — ABNORMAL LOW (ref 8.5–10.1)
Chloride: 106 mmol/L (ref 97–108)
Creatinine: 1.58 MG/DL — ABNORMAL HIGH (ref 0.55–1.02)
GFR est AA: 39 mL/min/{1.73_m2} — ABNORMAL LOW (ref >60)
GFR est non-AA: 33 mL/min/{1.73_m2} — ABNORMAL LOW (ref >60)
Globulin: 4 g/dL (ref 2.0–4.0)
Glucose: 175 mg/dL — ABNORMAL HIGH (ref 65–100)
Potassium: 5 mmol/L (ref 3.5–5.1)
Protein, total: 7.2 g/dL (ref 6.4–8.2)
Sodium: 139 mmol/L (ref 136–145)

## 2017-10-24 LAB — GLUCOSE, POC
Glucose (POC): 147 mg/dL — ABNORMAL HIGH (ref 65–100)
Glucose (POC): 157 mg/dL — ABNORMAL HIGH (ref 65–100)
Glucose (POC): 134 mg/dL — ABNORMAL HIGH (ref 65–100)

## 2017-10-24 LAB — CBC W/O DIFF
ABSOLUTE NRBC: 0.06 10*3/uL — ABNORMAL HIGH (ref 0.00–0.01)
HCT: 38.5 % (ref 35.0–47.0)
HGB: 12.1 g/dL (ref 11.5–16.0)
MCH: 28.3 PG (ref 26.0–34.0)
MCHC: 31.4 g/dL (ref 30.0–36.5)
MCV: 90 FL (ref 80.0–99.0)
MPV: 11.5 FL (ref 8.9–12.9)
NRBC: 0.6 PER 100 WBC — ABNORMAL HIGH
PLATELET: 250 10*3/uL (ref 150–400)
RBC: 4.28 M/uL (ref 3.80–5.20)
RDW: 17.6 % — ABNORMAL HIGH (ref 11.5–14.5)
WBC: 10.6 10*3/uL (ref 3.6–11.0)

## 2017-10-24 LAB — MAGNESIUM: Magnesium: 2.4 mg/dL (ref 1.6–2.4)

## 2017-10-24 LAB — PHOSPHORUS: Phosphorus: 4.4 MG/DL (ref 2.6–4.7)

## 2017-10-24 MED ORDER — FUROSEMIDE 10 MG/ML IJ SOLN
10 mg/mL | Freq: Once | INTRAMUSCULAR | Status: AC
Start: 2017-10-24 — End: 2017-10-24
  Administered 2017-10-24: 13:00:00 via INTRAVENOUS

## 2017-10-24 MED ORDER — METHYLPREDNISOLONE (PF) 40 MG/ML IJ SOLR
40 mg/mL | Freq: Two times a day (BID) | INTRAMUSCULAR | Status: DC
Start: 2017-10-24 — End: 2017-10-26
  Administered 2017-10-24 – 2017-10-26 (×4): via INTRAVENOUS

## 2017-10-24 MED ORDER — MUPIROCIN 2 % OINTMENT
2 % | Freq: Two times a day (BID) | CUTANEOUS | Status: AC
Start: 2017-10-24 — End: 2017-10-29
  Administered 2017-10-24 – 2017-10-28 (×11): via NASAL

## 2017-10-24 MED FILL — TOPROL XL 25 MG TABLET,EXTENDED RELEASE: 25 mg | ORAL | Qty: 2

## 2017-10-24 MED FILL — FUROSEMIDE 10 MG/ML IJ SOLN: 10 mg/mL | INTRAMUSCULAR | Qty: 6

## 2017-10-24 MED FILL — IPRATROPIUM-ALBUTEROL 2.5 MG-0.5 MG/3 ML NEB SOLUTION: 2.5 mg-0.5 mg/3 ml | RESPIRATORY_TRACT | Qty: 3

## 2017-10-24 MED FILL — ASPIRIN 81 MG TAB, DELAYED RELEASE: 81 mg | ORAL | Qty: 1

## 2017-10-24 MED FILL — HEPARIN (PORCINE) 5,000 UNIT/ML IJ SOLN: 5000 unit/mL | INTRAMUSCULAR | Qty: 2

## 2017-10-24 MED FILL — INSULIN LISPRO 100 UNIT/ML INJECTION: 100 unit/mL | SUBCUTANEOUS | Qty: 1

## 2017-10-24 MED FILL — INSULIN GLARGINE 100 UNIT/ML INJECTION: 100 unit/mL | SUBCUTANEOUS | Qty: 1

## 2017-10-24 MED FILL — BD POSIFLUSH NORMAL SALINE 0.9 % INJECTION SYRINGE: INTRAMUSCULAR | Qty: 20

## 2017-10-24 MED FILL — PRAVASTATIN 10 MG TAB: 10 mg | ORAL | Qty: 2

## 2017-10-24 MED FILL — MUPIROCIN 2 % OINTMENT: 2 % | CUTANEOUS | Qty: 22

## 2017-10-24 MED FILL — BD POSIFLUSH NORMAL SALINE 0.9 % INJECTION SYRINGE: INTRAMUSCULAR | Qty: 30

## 2017-10-24 MED FILL — SOLU-MEDROL (PF) 40 MG/ML SOLUTION FOR INJECTION: 40 mg/mL | INTRAMUSCULAR | Qty: 1

## 2017-10-24 NOTE — Progress Notes (Signed)
Chart reviewed. Spoke to RN who reports that pt remains inappropriate for participation in PT evaluation secondary to respiratory status. Pt continues to require BiPAP and decompensates with minimal activity. Will complete PT order as pt has remained inappropriate x3 days. Please re-consult when pt medically stable and able to actively participate in PT evaluation. Thank you    Demetrio Lapping, PT, DPT

## 2017-10-24 NOTE — Progress Notes (Addendum)
Report received from Sande Brothers, RN.  Patient sleeping on BIPAP currently.  Will allow to sleep for now.  0745 Patient remains sleeping, will awaken by 0900 if not aroused.    0820  Remains sleeping, aroused briefly with housekeeping, but returned quickly to sleeping.  Will continue to allow to rest for now.  0950  Tolerated BIPAP for short amount of time while eating bagel and drinking some juice.  Few bites of eggs consumed.  Returned to BIPAP and bathed without difficulty.  Remains very pleasant.   1250  Placed on Hi flow to eat lunch.  Awakened for FSBS prior to this.  Denies pain.  Assessment unchanged.  Eating now.  1630  No changes in assessment noted.  1905  Returned to BIPAP due to sats down to 86-88%.    1915  Report given to Linus Galas, RN.

## 2017-10-24 NOTE — Progress Notes (Addendum)
Cardiac Electrophysiology Progress Note            [address] [place], [place] [address]  [phone]    10/24/2017 9:45 AM    Admit Date: 10/09/2017    Admit Diagnosis: Acute respiratory failure with hypoxemia (HCC) [J96.01]    Subjective:     Brittney Shelton   denies chest pain, chest pressure/discomfort, palpitations, lower extremity edema. Reports improvement in breathing. On Bipap.     Visit Vitals  BP 128/72   Pulse 70   Temp 96.4 ??F (35.8 ??C)   Resp 19   Ht  (1.676 m)   Wt 296 lb 1.2 oz (134.3 kg)   SpO2 93%   BMI 47.79 kg/m??     Current Facility-Administered Medications   Medication Dose Route Frequency   ??? methylPREDNISolone (PF) (SOLU-MEDROL) injection 20 mg  20 mg IntraVENous Q12H   ??? mupirocin (BACTROBAN) 2 % ointment   Both Nostrils BID   ??? dextrose 10% infusion 125-250 mL  125-250 mL IntraVENous PRN   ??? insulin glargine (LANTUS) injection 5 Units  5 Units SubCUTAneous QHS   ??? insulin lispro (HUMALOG) injection   SubCUTAneous AC&HS   ??? sodium chloride (NS) flush 5-40 mL  5-40 mL IntraVENous Q8H   ??? sodium chloride (NS) flush 5-40 mL  5-40 mL IntraVENous PRN   ??? acetaminophen (TYLENOL) tablet 650 mg  650 mg Oral Q6H PRN   ??? naloxone (NARCAN) injection 0.4 mg  0.4 mg IntraVENous PRN   ??? ondansetron (ZOFRAN) injection 4 mg  4 mg IntraVENous Q4H PRN   ??? bisacodyl (DULCOLAX) suppository 10 mg  10 mg Rectal DAILY PRN   ??? heparin (porcine) injection 7,500 Units  7,500 Units SubCUTAneous Q8H   ??? albuterol-ipratropium (DUO-NEB) 2.5 MG-0.5 MG/3 ML  3 mL Nebulization Q4H RT   ??? aspirin delayed-release tablet 81 mg  81 mg Oral DAILY   ??? pravastatin (PRAVACHOL) tablet 20 mg  20 mg Oral QHS   ??? glucose chewable tablet 16 g  4 Tab Oral PRN   ??? glucagon (GLUCAGEN) injection 1 mg  1 mg IntraMUSCular PRN   ??? metoprolol succinate (TOPROL-XL) XL tablet 50 mg  50 mg Oral DAILY   ??? levoFLOXacin (LEVAQUIN) 750 mg in D5W IVPB  750 mg IntraVENous Q48H         Objective:      Visit Vitals  BP 128/72   Pulse 70    Temp 96.4 ??F (35.8 ??C)   Resp 19   Ht  (1.676 m)   Wt 296 lb 1.2 oz (134.3 kg)   SpO2 93%   BMI 47.79 kg/m??       Physical Exam:  General: well developed AAF.   Abdomen: soft, non-tender  Extremities: extremities normal  Heart: regular rate and rhythm  Lungs: clear to auscultation bilaterally  Pulses: 2+ and symmetric    Data Review:   Labs:    Recent Labs     10/24/17  0600 10/23/17  0608 10/22/17  0605   WBC 10.6 7.6 5.1   HGB 12.1 11.9 11.9   HCT 38.5 39.0 39.2   PLT 250 256 261     Recent Labs     10/24/17  0600 10/23/17  0608 10/22/17  0605 10/12/2017  1719   NA 139 137 137 137   K 5.0 5.4* 5.5* 5.0   CL 106 106 106 105   CO2 GLU 175* 217* 253*  119*   BUN 54* 51* 54* 55*   CREA 1.58* 1.46* 1.62* 1.61*   CA 7.9* 8.0* 7.9* 8.1*   MG 2.4 2.4  --  2.5*   PHOS 4.4 4.1  --   --    ALB 3.2* 3.2* 3.2* 3.5   TBILI 0.9 1.0 0.8 1.1*   SGOT 6* 11* 14* 21   ALT 24 26 34 40       Recent Labs     10/22/17  0605 10/05/2017  1719   TROIQ <0.05 <0.05         Intake/Output Summary (Last 24 hours) at 10/24/2017 0945  Last data filed at 10/24/2017 0840  Gross per 24 hour   Intake 50 ml   Output 2160 ml   Net -2110 ml        Telemetry:  Sinus rhythm ventricular rate 74.     Assessment:     Active Problems:    COPD (chronic obstructive pulmonary disease) (HCC) (07/26/2015)      Acute respiratory failure with hypoxemia (HCC) (10/28/2017)      Cor pulmonale (HCC) (10/22/2017)      Pulmonary hypertension (HCC) (10/22/2017)        Plan:     Cor Pulmonale 2/2 severe pulmonary HTN:  Likely stemming from patient's severe hypoxia and her COPD.  NOT 2/2 diastolic heart failure.  Patient not in acute heart failure and left atrium is not dilated on ECHO, which it would need to be for left heart pressures to be high enough to impact pulmonary HTN like patient's current values (PASP 96.6).  EF 61-65%.   ?? RHC could be helpful.  Nitric oxide only at [facility].  Would need to use other medications here if attempted.     ?? Recommend out patient evaluation with Dr. Fraser Din (pulmonary hypertension specialist).  When patient able, should also have repeated PFTS.    ?? Diuretics restarted by pulmonary. Continues to diurese. Will need to watch for dehydration.  If patient proves able to take pills okay with her nausea, would recommend PO lasix at her home dose since she is not in failure.    ?? Try to prevent hypotension  ?? Pulmonary meds and bipap per pulmonary/hospitalist      Amy Chales Abrahams, ANP    Patient seen and examined by me with nurse practitioner.  I personally performed all components of the history, physical, and medical decision making and agree with the assessment and plan with minor modifications as noted. Continues to diurese. Will need pfts and w/u for pulm htn as outpatient. Nl lvef. Will sign off.    Toma Copier, MD, [facility], [facility]        10/24/2017  9:45 AM

## 2017-10-24 NOTE — Progress Notes (Addendum)
1910- Bedside and Verbal shift change report given to T.Jackson,RN  (oncoming nurse) by P. Hajacos,RN  (offgoing nurse). Report included the following information SBAR, Kardex, Intake/Output, MAR, Recent Results and Cardiac Rhythm NSR.   1945- Shift assessment performed. Patient is awake, alert and follow commands. Currently on BIPAP at 70%. Tolerating well. Oxygen saturation 92%.   2030- Patient requested for snack. Placed on Hi flo. Maintain oxygen saturation 90% or better.  2115- Patient had complaints of nausea. Episode of emesis x2- undigested food appearance, 300 ml. Zofran 4 mg given. Will continue to monitor.  2245- Pt. Placed back on BIPAP at 70%.    0000- Reassessment performed. See flow sheet.   0400- Reassessment performed. See flow sheet.  0600- Pt resting on BIPAP , oxygen saturation 93%.  1610- Shift report given to Jake Church, RN.

## 2017-10-24 NOTE — Progress Notes (Signed)
[facility] Critical [facility], [facility]  Name: Brittney Shelton MRN: [id number]   DOB: [birth year] Hospital: [facility]   Date: 10/24/2017        IMPRESSION:   ?? Acute/chronic hypoxic/hypercapnic respiratory failure  ?? Community acquired pneumonia  ?? "COPD exacerbation" - she has a diagnosis of COPD, but she has no obstruction on PFTs; patient of Dr. Jayme Cloud  ?? Acute renal failure  ?? Pulmonary HTN - likely due to diastolic heart failure, but can not r/o other causes  ?? LVH/diastolic heart failure  ?? DM/hyperglycemia  ?? Obesity - ?OSA/OHS      PLAN:   ?? On bipap, O2  ?? Bronchodilators  ?? Empiric antibiotics   ?? Follow up cultures  ?? Diurese today  ?? Will eventually need PH evaluation, including sleep study   ?? Monitor creatinine, worse today  ?? Insulin   ?? DVT prophyaxis  ?? NPO     Subjective/Interval History:   I have reviewed the flowsheet and previous day???s notes.    On bipap  No severe distress    Review of Systems   Unable to perform ROS: Acuity of condition     Objective:  Vital Signs:    Visit Vitals  BP 110/45   Pulse 70   Temp 98.1 ??F (36.7 ??C)   Resp 19   Ht  (1.676 m)   Wt 119.7 kg (264 lb)   SpO2 96%   BMI 42.61 kg/m??       O2 Device: BIPAP   O2 Flow Rate (L/min): 40 l/min   Temp (24hrs), Avg:98 ??F (36.7 ??C), Min:97.7 ??F (36.5 ??C), Max:98.2 ??F (36.8 ??C)     Intake/Output:   Last shift:      No intake/output data recorded.  Last 3 shifts: 05/23 1901 - 05/25 0700  In: 1805 [P.O.:240; I.V.:1565]  Out: 2765 [Urine:2615]    Intake/Output Summary (Last 24 hours) at 10/24/2017 0802  Last data filed at 10/24/2017 0600  Gross per 24 hour   Intake 340 ml   Output 2070 ml   Net -1730 ml     Hemodynamics:   PAP:   CO:     Wedge:   CI:     CVP:    SVR:       PVR:       Ventilator Settings:  Mode Rate Tidal Volume Pressure FiO2 PEEP            80 %       Peak airway pressure:      Minute ventilation: 6.2 l/min       Physical Exam    Constitutional: No distress. Face mask in place.   HENT:   Head: Normocephalic and atraumatic.   Eyes: No scleral icterus.   Cardiovascular: Normal rate and regular rhythm.   Pulmonary/Chest: She has no wheezes. She has rales in the right lower field.   Abdominal: Soft. Bowel sounds are normal. She exhibits no distension. There is no tenderness.   Musculoskeletal: She exhibits edema.   Neurological: She is alert.   Skin: Skin is warm and dry.     Data:     Current Facility-Administered Medications   Medication Dose Route Frequency   ??? methylPREDNISolone (PF) (SOLU-MEDROL) injection 40 mg  40 mg IntraVENous Q12H   ??? insulin glargine (LANTUS) injection 5 Units  5 Units SubCUTAneous QHS   ??? insulin lispro (HUMALOG) injection   SubCUTAneous AC&HS   ??? sodium  chloride (NS) flush 5-40 mL  5-40 mL IntraVENous Q8H   ??? heparin (porcine) injection 7,500 Units  7,500 Units SubCUTAneous Q8H   ??? albuterol-ipratropium (DUO-NEB) 2.5 MG-0.5 MG/3 ML  3 mL Nebulization Q4H RT   ??? aspirin delayed-release tablet 81 mg  81 mg Oral DAILY   ??? pravastatin (PRAVACHOL) tablet 20 mg  20 mg Oral QHS   ??? metoprolol succinate (TOPROL-XL) XL tablet 50 mg  50 mg Oral DAILY   ??? levoFLOXacin (LEVAQUIN) 750 mg in D5W IVPB  750 mg IntraVENous Q48H                Labs:  Recent Labs     10/24/17  0600 10/23/17  0608 10/22/17  0605   WBC 10.6 7.6 5.1   HGB 12.1 11.9 11.9   HCT 38.5 39.0 39.2   PLT 250 256 261     Recent Labs     10/24/17  0600 10/23/17  0608 10/22/17  0605 04-Nov-2017  1719   NA 139 137 137 137   K 5.0 5.4* 5.5* 5.0   CL 106 106 106 105   CO2 GLU 175* 217* 253* 119*   BUN 54* 51* 54* 55*   CREA 1.58* 1.46* 1.62* 1.61*   CA 7.9* 8.0* 7.9* 8.1*   MG 2.4 2.4  --  2.5*   PHOS 4.4 4.1  --   --    ALB 3.2* 3.2* 3.2* 3.5   TBILI 0.9 1.0 0.8 1.1*   SGOT 6* 11* 14* 21   ALT 24 26 34 40     Recent Labs     10/22/17  0936 10/22/17  0357 10/22/17  0149   PHI 7.321* 7.238* 7.254*   PCO2I 47.1* 55.4* 55.6*   PO2I 59* 114* 78*    HCO3I 24.3 23.6 24.6   FIO2I 100 100 100     Imaging:  I have personally reviewed the patient???s radiographs and have reviewed the reports:  CXR: pulmonary edema, bilateral pleural effusions        Total critical care time exclusive of procedures:   minutes  Vickki Muff, MD

## 2017-10-24 NOTE — Progress Notes (Signed)
Hospitalist Progress Note  NAME: Brittney Shelton   DOB:  [birth year]   MRN:  [id number]       Assessment / Plan:  Acute on chronic respiratory failure with hypoxia POA  Acute on chronic diastolic CHF POA, Cor pulmonale POA  ? Right lower lobe pneumonia  ? COPD exacerbation POA  Worsening DOE and o2 requirements past week prior to admission; hypoxia remains severe and she is clinically tenuous at this time  On Levaquin for pneumonia   IV steroids and nebs -- weaning steroids  Continue support with high-flow nasal cannula, BiPAP PRN  ??  Acute kidney injury POA BUN/creat 55 and 1.61, unchanged overnight  Urine output is excellent. Creatinine remaining around 1.5-1.6    Hyperkalemia, mild  Resolved??    Essential HTN POA  Normotensive now; holding Norvasc  ??  DM type 2 POA  Diet controlled at home  Last HgBa1c 6.4 in Dec 2018  Hyperglycemic on steroids; added low-dose Lantus; good improvement in glycemic control  ??  Right pleural effusion POA  Small, doubt thoracentesis would contribute much  ??  Remote tobacco use quit 20 years ago  ??  40 or above Morbid obesity / Body mass index is 47.79 kg/m??.    Code status: Full  Prophylaxis: Hep SQ  Recommended Disposition: TBD     Subjective:     Chief Complaint / Reason for Physician Visit: follow up acute respiratory failure  Continues on BiPAP -- FiO2 80% this AM, now decreased to 60%. She generally just feels worn out but denies dyspnea. Denies pain. Wants to go home. Declines my offer to speak with any family or friends on her behalf.    Review of Systems:  Symptom Y/N Comments  Symptom Y/N Comments   Fever/Chills n   Chest Pain n    Poor Appetite n   Edema y    Cough    Abdominal Pain n    Sputum    Joint Pain     SOB/DOE    Pruritis/Rash     Nausea/vomit    Tolerating PT/OT     Diarrhea n   Tolerating Diet     Constipation n   Other       Could NOT obtain due to:      Objective:     VITALS:   Last 24hrs VS reviewed since prior progress note. Most recent are:   Patient Vitals for the past 24 hrs:   Temp Pulse Resp BP SpO2   10/24/17 1530 ??? ??? ??? ??? 96 %   10/24/17 1500 ??? 68 19 118/73 95 %   10/24/17 1400 ??? 70 21 112/63 96 %   10/24/17 1300 ??? 73 18 122/78 97 %   10/24/17 1250 ??? 73 22 ??? 97 %   10/24/17 1200 97.7 ??F (36.5 ??C) 74 18 124/77 100 %   10/24/17 1116 ??? ??? ??? ??? 95 %   10/24/17 1100 ??? 70 21 133/80 97 %   10/24/17 1005 ??? 72 22 117/66 96 %   10/24/17 0950 ??? 72 19 ??? 90 %   10/24/17 0945 ??? 72 19 ??? (!) 86 %   10/24/17 0915 ??? ??? ??? ??? 93 %   10/24/17 0900 ??? 70 19 128/72 95 %   10/24/17 0840 96.4 ??F (35.8 ??C) 64 17 ??? 97 %   10/24/17 0800 ??? 64 20 104/55 98 %   10/24/17 0744 ??? ??? ??? ??? 96 %   10/24/17 0700 ???  70 19 110/45 96 %   10/24/17 0600 ??? 68 19 103/59 95 %   10/24/17 0500 ??? 70 18 99/55 95 %   10/24/17 0420 ??? ??? ??? ??? 94 %   10/24/17 0400 98.1 ??F (36.7 ??C) 70 20 123/74 95 %   10/24/17 0300 ??? 67 19 119/74 96 %   10/24/17 0200 ??? 69 20 119/67 94 %   10/24/17 0100 ??? 69 19 122/62 94 %   10/24/17 0000 ??? 67 22 ??? 97 %   10/23/17 2321 ??? ??? ??? ??? 95 %   10/23/17 2300 ??? 76 21 124/55 92 %   10/23/17 2200 ??? 74 19 116/63 (!) 89 %   10/23/17 2000 98.2 ??F (36.8 ??C) 76 21 158/74 95 %   10/23/17 1944 ??? ??? ??? ??? 92 %   10/23/17 1900 ??? 71 21 116/58 93 %   10/23/17 1800 ??? 75 22 100/68 (!) 89 %   10/23/17 1700 ??? 72 21 134/65 93 %   10/23/17 1600 ??? 70 20 121/80 96 %       Intake/Output Summary (Last 24 hours) at 10/24/2017 1548  Last data filed at 10/24/2017 1500  Gross per 24 hour   Intake 300 ml   Output 2430 ml   Net -2130 ml        PHYSICAL EXAM:  General: WD, WN. Alert, cooperative, no acute distress????  EENT:  EOMI. Anicteric sclerae. MMM  Resp:  Limited exam secondary to habitus, basilar rales present  CV:  Regular  Rhythm,??+edema, improved  GI:  Soft, Non distended, Non tender. ??+Bowel sounds  Neurologic:?? Alert and oriented X 3, normal speech,   Psych:???? Good insight.??Not anxious nor agitated  Skin:  No rashes.  No jaundice    Reviewed most current lab test results and cultures  YES   Reviewed most current radiology test results   YES  Review and summation of old records today    NO  Reviewed patient's current orders and MAR    YES  PMH/SH reviewed - no change compared to H&P  ________________________________________________________________________  Care Plan discussed with:    Comments   Patient x    Family      RN     Care Manager     Consultant                        Multidiciplinary team rounds were held today with case manager, nursing, pharmacist and clinical coordinator.  Patient's plan of care was discussed; medications were reviewed and discharge planning was addressed.     ________________________________________________________________________  Total NON critical care TIME:  20   Minutes    Total CRITICAL CARE TIME Spent:   Minutes non procedure based      Comments   >50% of visit spent in counseling and coordination of care     ________________________________________________________________________  Margie Ege, MD     Procedures: see electronic medical records for all procedures/Xrays and details which were not copied into this note but were reviewed prior to creation of Plan.      LABS:  I reviewed today's most current labs and imaging studies.  Pertinent labs include:  Recent Labs     10/24/17  0600 10/23/17  0608 10/22/17  0605   WBC 10.6 7.6 5.1   HGB 12.1 11.9 11.9   HCT 38.5 39.0 39.2   PLT 250 256 261     Recent Labs  10/24/17  0600 10/23/17  0608 10/22/17  0605 10/07/2017  1719   NA 139 137 137 137   K 5.0 5.4* 5.5* 5.0   CL 106 106 106 105   CO2 GLU 175* 217* 253* 119*   BUN 54* 51* 54* 55*   CREA 1.58* 1.46* 1.62* 1.61*   CA 7.9* 8.0* 7.9* 8.1*   MG 2.4 2.4  --  2.5*   PHOS 4.4 4.1  --   --    ALB 3.2* 3.2* 3.2* 3.5   TBILI 0.9 1.0 0.8 1.1*   SGOT 6* 11* 14* 21   ALT 24 26 34 40       Signed: Margie Ege, MD

## 2017-10-25 ENCOUNTER — Inpatient Hospital Stay: Admit: 2017-10-25 | Payer: MEDICARE | Primary: Internal Medicine

## 2017-10-25 LAB — POC G3 - PUL
Base excess (POC): 2 mmol/L
FIO2 (POC): 70 %
HCO3 (POC): 28.1 MMOL/L — ABNORMAL HIGH (ref 22–26)
PEEP/CPAP (POC): 10 cmH2O
PIP (POC): 14
Total resp. rate: 17
pCO2 (POC): 57.9 MMHG — ABNORMAL HIGH (ref 35.0–45.0)
pH (POC): 7.293 — ABNORMAL LOW (ref 7.35–7.45)
pO2 (POC): 72 MMHG — ABNORMAL LOW (ref 80–100)
sO2 (POC): 92 % (ref 92–97)

## 2017-10-25 LAB — DIFFERENTIAL, AUTO.
ABS. BASOPHILS: 0 10*3/uL (ref 0.0–0.1)
ABS. EOSINOPHILS: 0 10*3/uL (ref 0.0–0.4)
ABS. IMM. GRANS.: 0.1 10*3/uL — ABNORMAL HIGH (ref 0.00–0.04)
ABS. LYMPHOCYTES: 0.7 10*3/uL — ABNORMAL LOW (ref 0.8–3.5)
ABS. MONOCYTES: 0.8 10*3/uL (ref 0.0–1.0)
ABS. NEUTROPHILS: 8.7 10*3/uL — ABNORMAL HIGH (ref 1.8–8.0)
BASOPHILS: 0 % (ref 0–1)
EOSINOPHILS: 0 % (ref 0–7)
IMMATURE GRANULOCYTES: 1 % — ABNORMAL HIGH (ref 0.0–0.5)
LYMPHOCYTES: 7 % — ABNORMAL LOW (ref 12–49)
MONOCYTES: 8 % (ref 5–13)
NEUTROPHILS: 85 % — ABNORMAL HIGH (ref 32–75)

## 2017-10-25 LAB — CBC W/O DIFF
ABSOLUTE NRBC: 0.05 10*3/uL — ABNORMAL HIGH (ref 0.00–0.01)
HCT: 38.9 % (ref 35.0–47.0)
HGB: 12.3 g/dL (ref 11.5–16.0)
MCH: 27.8 PG (ref 26.0–34.0)
MCHC: 31.6 g/dL (ref 30.0–36.5)
MCV: 88 FL (ref 80.0–99.0)
MPV: 10.7 FL (ref 8.9–12.9)
NRBC: 0.5 PER 100 WBC — ABNORMAL HIGH
PLATELET: 226 10*3/uL (ref 150–400)
RBC: 4.42 M/uL (ref 3.80–5.20)
RDW: 17.4 % — ABNORMAL HIGH (ref 11.5–14.5)
WBC: 10.3 10*3/uL (ref 3.6–11.0)

## 2017-10-25 LAB — MAGNESIUM: Magnesium: 2.3 mg/dL (ref 1.6–2.4)

## 2017-10-25 LAB — METABOLIC PANEL, COMPREHENSIVE
A-G Ratio: 0.8 — ABNORMAL LOW (ref 1.1–2.2)
ALT (SGPT): 24 U/L (ref 12–78)
AST (SGOT): 9 U/L — ABNORMAL LOW (ref 15–37)
Albumin: 3.2 g/dL — ABNORMAL LOW (ref 3.5–5.0)
Alk. phosphatase: 79 U/L (ref 45–117)
Anion gap: 5 mmol/L (ref 5–15)
BUN/Creatinine ratio: 40 — ABNORMAL HIGH (ref 12–20)
BUN: 56 MG/DL — ABNORMAL HIGH (ref 6–20)
Bilirubin, total: 0.9 MG/DL (ref 0.2–1.0)
CO2: 28 mmol/L (ref 21–32)
Calcium: 7.8 MG/DL — ABNORMAL LOW (ref 8.5–10.1)
Chloride: 107 mmol/L (ref 97–108)
Creatinine: 1.41 MG/DL — ABNORMAL HIGH (ref 0.55–1.02)
GFR est AA: 45 mL/min/{1.73_m2} — ABNORMAL LOW (ref >60)
GFR est non-AA: 37 mL/min/{1.73_m2} — ABNORMAL LOW (ref >60)
Globulin: 3.8 g/dL (ref 2.0–4.0)
Glucose: 151 mg/dL — ABNORMAL HIGH (ref 65–100)
Potassium: 4.6 mmol/L (ref 3.5–5.1)
Protein, total: 7 g/dL (ref 6.4–8.2)
Sodium: 140 mmol/L (ref 136–145)

## 2017-10-25 LAB — GLUCOSE, POC
Glucose (POC): 208 mg/dL — ABNORMAL HIGH (ref 65–100)
Glucose (POC): 127 mg/dL — ABNORMAL HIGH (ref 65–100)
Glucose (POC): 215 mg/dL — ABNORMAL HIGH (ref 65–100)
Glucose (POC): 171 mg/dL — ABNORMAL HIGH (ref 65–100)

## 2017-10-25 MED ORDER — LEVOFLOXACIN IN D5W 750 MG/150 ML IV PIGGY BACK
750 mg/150 mL | INTRAVENOUS | Status: AC
Start: 2017-10-25 — End: 2017-10-28
  Administered 2017-10-25 – 2017-10-28 (×4): via INTRAVENOUS

## 2017-10-25 MED ORDER — FUROSEMIDE 10 MG/ML IJ SOLN
10 mg/mL | Freq: Once | INTRAMUSCULAR | Status: AC
Start: 2017-10-25 — End: 2017-10-25
  Administered 2017-10-25: 14:00:00 via INTRAVENOUS

## 2017-10-25 MED ORDER — POTASSIUM CHLORIDE SR 10 MEQ TAB
10 mEq | ORAL_TABLET | ORAL | 0 refills | Status: DC
Start: 2017-10-25 — End: 2017-11-07

## 2017-10-25 MED ORDER — SENNOSIDES 8.6 MG TAB
8.6 mg | Freq: Every day | ORAL | Status: DC
Start: 2017-10-25 — End: 2017-11-01
  Administered 2017-10-25 – 2017-11-01 (×9): via ORAL

## 2017-10-25 MED FILL — MAPAP (ACETAMINOPHEN) 325 MG TABLET: 325 mg | ORAL | Qty: 2

## 2017-10-25 MED FILL — SOLU-MEDROL (PF) 40 MG/ML SOLUTION FOR INJECTION: 40 mg/mL | INTRAMUSCULAR | Qty: 1

## 2017-10-25 MED FILL — HEPARIN (PORCINE) 5,000 UNIT/ML IJ SOLN: 5000 unit/mL | INTRAMUSCULAR | Qty: 2

## 2017-10-25 MED FILL — PRAVASTATIN 10 MG TAB: 10 mg | ORAL | Qty: 2

## 2017-10-25 MED FILL — SENNA 8.6 MG TABLET: 8.6 mg | ORAL | Qty: 1

## 2017-10-25 MED FILL — IPRATROPIUM-ALBUTEROL 2.5 MG-0.5 MG/3 ML NEB SOLUTION: 2.5 mg-0.5 mg/3 ml | RESPIRATORY_TRACT | Qty: 3

## 2017-10-25 MED FILL — INSULIN LISPRO 100 UNIT/ML INJECTION: 100 unit/mL | SUBCUTANEOUS | Qty: 1

## 2017-10-25 MED FILL — ASPIRIN 81 MG TAB, DELAYED RELEASE: 81 mg | ORAL | Qty: 1

## 2017-10-25 MED FILL — INSULIN GLARGINE 100 UNIT/ML INJECTION: 100 unit/mL | SUBCUTANEOUS | Qty: 1

## 2017-10-25 MED FILL — LEVOFLOXACIN IN D5W 750 MG/150 ML IV PIGGY BACK: 750 mg/150 mL | INTRAVENOUS | Qty: 150

## 2017-10-25 MED FILL — BD POSIFLUSH NORMAL SALINE 0.9 % INJECTION SYRINGE: INTRAMUSCULAR | Qty: 50

## 2017-10-25 MED FILL — FUROSEMIDE 10 MG/ML IJ SOLN: 10 mg/mL | INTRAMUSCULAR | Qty: 8

## 2017-10-25 MED FILL — TOPROL XL 25 MG TABLET,EXTENDED RELEASE: 25 mg | ORAL | Qty: 2

## 2017-10-25 MED FILL — ONDANSETRON (PF) 4 MG/2 ML INJECTION: 4 mg/2 mL | INTRAMUSCULAR | Qty: 2

## 2017-10-25 MED FILL — BD POSIFLUSH NORMAL SALINE 0.9 % INJECTION SYRINGE: INTRAMUSCULAR | Qty: 30

## 2017-10-25 NOTE — Progress Notes (Signed)
Hospitalist Progress Note  NAME: Brittney Shelton   DOB:  03-16-1951   MRN:  [id number]       Assessment / Plan:  Acute on chronic respiratory failure with hypoxia POA  Acute on chronic diastolic CHF POA, Cor pulmonale POA  ? Right lower lobe pneumonia  ? COPD exacerbation POA  Worsening DOE and o2 requirements past week prior to admission; hypoxia remains severe and she is clinically tenuous at this time -- starting to look better?  On Levaquin for pneumonia   IV steroids and nebs -- weaning steroids  Agree with careful diuresis  Continue support with high-flow nasal cannula, BiPAP PRN  ??  Acute kidney injury POA BUN/creat 55 and 1.61, unchanged overnight  Urine output is excellent. Creatinine remaining around 1.5-1.6    Hyperkalemia, mild  Resolved??    Essential HTN POA  Normotensive now; holding Norvasc  ??  DM type 2 POA  Diet controlled at home  Last HgBa1c 6.4 in Dec 2018  Hyperglycemic on steroids; refusing insulin, unfortunately -- may try an oral agent in AM to help control glucose  ??  Right pleural effusion POA  Small, doubt thoracentesis would contribute much  ??  Remote tobacco use quit 20 years ago  ??  40 or above Morbid obesity / Body mass index is 47.36 kg/m??.    Code status: Full  Prophylaxis: Hep SQ  Recommended Disposition: TBD     Subjective:     Chief Complaint / Reason for Physician Visit: follow up acute respiratory failure  Feeling better today compared to yesterday. Vomited again last night. Back and forth between BiPAP and HFNC. Noted to be refusing insulin. Diuresed well overnight.    Review of Systems:  Symptom Y/N Comments  Symptom Y/N Comments   Fever/Chills n   Chest Pain n    Poor Appetite n   Edema y    Cough    Abdominal Pain n    Sputum    Joint Pain     SOB/DOE    Pruritis/Rash     Nausea/vomit y   Tolerating PT/OT     Diarrhea n   Tolerating Diet     Constipation n   Other       Could NOT obtain due to:      Objective:     VITALS:    Last 24hrs VS reviewed since prior progress note. Most recent are:  Patient Vitals for the past 24 hrs:   Temp Pulse Resp BP SpO2   10/25/17 1552 97.9 ??F (36.6 ??C) ??? ??? ??? 92 %   10/25/17 1515 ??? ??? ??? ??? (!) 89 %   10/25/17 1500 ??? 77 16 113/61 (!) 88 %   10/25/17 1400 ??? 79 20 98/45 (!) 84 %   10/25/17 1300 ??? 72 22 115/73 99 %   10/25/17 1200 98 ??F (36.7 ??C) 73 18 129/76 94 %   10/25/17 1103 ??? ??? ??? ??? 91 %   10/25/17 1100 ??? 74 20 122/72 93 %   10/25/17 1000 ??? 73 20 117/59 (!) 89 %   10/25/17 0900 ??? 65 18 124/72 93 %   10/25/17 0800 ??? 63 18 120/73 95 %   10/25/17 0718 ??? ??? ??? ??? 93 %   10/25/17 0717 97.5 ??F (36.4 ??C) ??? ??? ??? ???   10/25/17 0700 ??? 68 20 120/72 93 %   10/25/17 0600 ??? 69 12 125/76 94 %   10/25/17 0500 ??? 65 16  119/73 95 %   10/25/17 0400 ??? 66 18 113/67 91 %   10/25/17 0307 ??? 70 19 121/66 94 %   10/25/17 0300 ??? 64 19 (!) 90/32 93 %   10/25/17 0200 ??? 66 18 105/54 92 %   10/25/17 0100 ??? 73 18 135/75 94 %   10/25/17 0001 ??? 74 22 121/77 96 %   10/25/17 0000 98.2 ??F (36.8 ??C) 74 21 121/77 94 %   10/24/17 2315 ??? ??? ??? ??? 92 %   10/24/17 2300 ??? 82 20 126/70 91 %   10/24/17 2218 ??? 79 22 ??? 92 %   10/24/17 2200 ??? 85 23 109/86 ???   10/24/17 2130 ??? 82 21 ??? 92 %   10/24/17 2100 ??? 81 22 104/83 ???   10/24/17 2000 97.7 ??F (36.5 ??C) 73 21 124/75 91 %   10/24/17 1932 ??? ??? ??? ??? 92 %   10/24/17 1900 ??? 71 21 108/61 91 %   10/24/17 1810 ??? 66 20 ??? 95 %   10/24/17 1800 ??? 67 17 115/69 95 %       Intake/Output Summary (Last 24 hours) at 10/25/2017 1706  Last data filed at 10/25/2017 1603  Gross per 24 hour   Intake 1420 ml   Output 2425 ml   Net -1005 ml        PHYSICAL EXAM:  General: WD, WN. Alert, cooperative, no acute distress????  EENT:  EOMI. Anicteric sclerae. MMM  Resp:  Limited exam secondary to habitus, basilar rales present  CV:  Regular  Rhythm,??+edema, improved  GI:  Soft, Non distended, Non tender. ??+Bowel sounds  Neurologic:?? Alert and oriented X 3, normal speech,   Psych:???? Good insight.??Not anxious nor agitated   Skin:  No rashes.  No jaundice    Reviewed most current lab test results and cultures  YES  Reviewed most current radiology test results   YES  Review and summation of old records today    NO  Reviewed patient's current orders and MAR    YES  PMH/SH reviewed - no change compared to H&P  ________________________________________________________________________  Care Plan discussed with:    Comments   Patient x    Family      RN x    Care Manager     Consultant                        Multidiciplinary team rounds were held today with case manager, nursing, pharmacist and Higher education careers adviser.  Patient's plan of care was discussed; medications were reviewed and discharge planning was addressed.     ________________________________________________________________________  Total NON critical care TIME:  20   Minutes    Total CRITICAL CARE TIME Spent:   Minutes non procedure based      Comments   >50% of visit spent in counseling and coordination of care     ________________________________________________________________________  Margie Ege, MD     Procedures: see electronic medical records for all procedures/Xrays and details which were not copied into this note but were reviewed prior to creation of Plan.      LABS:  I reviewed today's most current labs and imaging studies.  Pertinent labs include:  Recent Labs     10/25/17  0346 10/24/17  0600 10/23/17  0608   WBC 10.3 10.6 7.6   HGB 12.3 12.1 11.9   HCT 38.9 38.5 39.0   PLT 226 250 256  Recent Labs     10/25/17  0346 10/24/17  0600 10/23/17  0608   NA 140 139 137   K 4.6 5.0 5.4*   CL 107 106 106   CO2 GLU 151* 175* 217*   BUN 56* 54* 51*   CREA 1.41* 1.58* 1.46*   CA 7.8* 7.9* 8.0*   MG 2.3 2.4 2.4   PHOS  --  4.4 4.1   ALB 3.2* 3.2* 3.2*   TBILI 0.9 0.9 1.0   SGOT 9* 6* 11*   ALT Signed: Margie Ege, MD

## 2017-10-25 NOTE — Progress Notes (Signed)
[facility], Critical Care, and Sleep Medicine  Name: Brittney Shelton MRN: [id number]   DOB: 02/28/1951 Hospital: [facility]   Date: 10/25/2017        IMPRESSION:   ?? Acute/chronic hypoxic/hypercapnic respiratory failure  ?? Community acquired pneumonia  ?? "COPD exacerbation" - she has a diagnosis of COPD, but she has no obstruction on PFTs; patient of Dr. Jayme Cloud  ?? Acute renal failure  ?? Pulmonary HTN - likely due to diastolic heart failure, but can not r/o other causes  ?? LVH/diastolic heart failure  ?? DM/hyperglycemia  ?? Obesity - ?OSA/OHS      PLAN:   ?? On bipap, O2  ?? Bronchodilators  ?? Empiric antibiotics   ?? Follow up cultures  ?? Diurese today again  ?? Will eventually need PH evaluation, including sleep study   ?? Monitor creatinine, better today  ?? Insulin   ?? DVT prophyaxis  ?? NPO     Subjective/Interval History:   I have reviewed the flowsheet and previous day???s notes.    No acute events overnight  On bipap  No severe distress    Review of Systems   Unable to perform ROS: Acuity of condition     Objective:  Vital Signs:    Visit Vitals  BP 120/72   Pulse 68   Temp 97.5 ??F (36.4 ??C)   Resp 20   Ht  (1.676 m)   Wt 134.3 kg (296 lb 1.2 oz)   SpO2 93%   BMI 47.79 kg/m??       O2 Device: BIPAP   O2 Flow Rate (L/min): 40 l/min   Temp (24hrs), Avg:97.5 ??F (36.4 ??C), Min:96.4 ??F (35.8 ??C), Max:98.2 ??F (36.8 ??C)     Intake/Output:   Last shift:      05/26 0701 - 05/26 1900  In: -   Out: 50 [Urine:50]  Last 3 shifts: 05/24 1901 - 05/26 0700  In: 960 [P.O.:960]  Out: 3135 [Urine:3135]    Intake/Output Summary (Last 24 hours) at 10/25/2017 0802  Last data filed at 10/25/2017 0718  Gross per 24 hour   Intake 960 ml   Output 1920 ml   Net -960 ml     Hemodynamics:   PAP:   CO:     Wedge:   CI:     CVP:    SVR:       PVR:       Ventilator Settings:  Mode Rate Tidal Volume Pressure FiO2 PEEP            70 %       Peak airway pressure:      Minute ventilation: 9 l/min        Physical Exam   Constitutional: No distress. Face mask in place.   HENT:   Head: Normocephalic and atraumatic.   Eyes: No scleral icterus.   Cardiovascular: Normal rate and regular rhythm.   Pulmonary/Chest: She has no wheezes. She has rales in the right lower field.   Abdominal: Soft. Bowel sounds are normal. She exhibits no distension. There is no tenderness.   Musculoskeletal: She exhibits edema.   Neurological: She is alert.   Skin: Skin is warm and dry.     Data:     Current Facility-Administered Medications   Medication Dose Route Frequency   ??? methylPREDNISolone (PF) (SOLU-MEDROL) injection 20 mg  20 mg IntraVENous Q12H   ??? mupirocin (BACTROBAN) 2 % ointment   Both Nostrils BID   ???  insulin glargine (LANTUS) injection 5 Units  5 Units SubCUTAneous QHS   ??? insulin lispro (HUMALOG) injection   SubCUTAneous AC&HS   ??? sodium chloride (NS) flush 5-40 mL  5-40 mL IntraVENous Q8H   ??? heparin (porcine) injection 7,500 Units  7,500 Units SubCUTAneous Q8H   ??? albuterol-ipratropium (DUO-NEB) 2.5 MG-0.5 MG/3 ML  3 mL Nebulization Q4H RT   ??? aspirin delayed-release tablet 81 mg  81 mg Oral DAILY   ??? pravastatin (PRAVACHOL) tablet 20 mg  20 mg Oral QHS   ??? metoprolol succinate (TOPROL-XL) XL tablet 50 mg  50 mg Oral DAILY   ??? levoFLOXacin (LEVAQUIN) 750 mg in D5W IVPB  750 mg IntraVENous Q48H                Labs:  Recent Labs     10/25/17  0346 10/24/17  0600 10/23/17  0608   WBC 10.3 10.6 7.6   HGB 12.3 12.1 11.9   HCT 38.9 38.5 39.0   PLT 226 250 256     Recent Labs     10/25/17  0346 10/24/17  0600 10/23/17  0608   NA 140 139 137   K 4.6 5.0 5.4*   CL 107 106 106   CO2 GLU 151* 175* 217*   BUN 56* 54* 51*   CREA 1.41* 1.58* 1.46*   CA 7.8* 7.9* 8.0*   MG 2.3 2.4 2.4   PHOS  --  4.4 4.1   ALB 3.2* 3.2* 3.2*   TBILI 0.9 0.9 1.0   SGOT 9* 6* 11*   ALT Recent Labs     10/25/17  0601 10/22/17  0936   PHI 7.293* 7.321*   PCO2I 57.9* 47.1*   PO2I 72* 59*   HCO3I 28.1* 24.3   FIO2I 70 100     Imaging:   I have personally reviewed the patient???s radiographs and have reviewed the reports:  CXR: pulmonary edema, bilateral pleural effusions        Total critical care time exclusive of procedures:   minutes  Vickki Muff, MD

## 2017-10-25 NOTE — Progress Notes (Signed)
Pharmacy Automatic Renal Dosing Protocol - Antimicrobials  Indication for Antimicrobials: CAP     Current Regimen of Each Antimicrobial:  Levaquin  q48h (Start Date 10/22/17; Day # 4/7)    Previous Antimicrobial Therapy:  Metronidazole  q12h - started 5/23    Significant Cultures:   NA    Radiology / Imaging results: (X-ray, CT scan or MRI):   5/22: CT scan  1. No evidence pulmonary embolism.   2. Small right basilar pleural effusion and right basilar compressive atelectasis.  3. Cardiomegaly.    Paralysis, amputations, malnutrition: NA    Labs:  Recent Labs     10/25/17  0346 10/24/17  0600 10/23/17  0608   CREA 1.41* 1.58* 1.46*   BUN 56* 54* 51*   WBC 10.3 10.6 7.6     Temp (24hrs), Avg:97.8 ??F (36.6 ??C), Min:97.5 ??F (36.4 ??C), Max:98.2 ??F (36.8 ??C)    Creatinine Clearance (mL/min) or Dialysis: 53    Impression/Plan:   Scr trending down and based on the current renal function, will adjust the Levofloxacin, to 750 mg q 24 hours  Afebrile  WBC stable   Antimicrobial stop date 10/28/17 (7 days)     Pharmacy will follow daily and adjust medications as appropriate for renal function and/or serum levels.    Thank you,  Biniyam Eyasu, PHARMD    Recommended duration of therapy  [email or web address]    Renal Dosing  [email or web address]

## 2017-10-25 NOTE — Progress Notes (Addendum)
Received report from Tomicka RN  0730: Patient resting comfortbaly, Bipap on. Will wake patient up for breakfast and try high flow.   8119: Breakfast at bedside ,patient placed on high flow. Informed patient to inform RN when patient is done and/or patient feels need for Bipap.   0930: Patient done with breakfast, patient feeling like she is working hard to breathe. Bipap replaced.   1000: Patient refuses turn at this time states "Maybe later".   1119: Patient refusing insulin at this time. Patient states "I don't take it at home, so I don't need it".   Informed patient of other reason for insulin coverage beside diabetes like steroids. Patient still refuses. Will continue to monitor.   1345: Patient place on high flow O 2 at 40 L/Min  1458: Dr. Doy Hutching at bedside. Informed MD that patient had not had BM since admission, has been having N/V/belly pain. MD to place orders. Also, informed MD of patient refusing insulin, no new orders. Will continue to monitor.   1553: Patient placed back on bipap,  O2 around 83-82%. PRN tylenol given for HA. Will continue to monitor.   1700:Patient placed on High flow in preparation for dinner.   1830: Patient placed back on Bipap.   1905: Report given to [employer].

## 2017-10-25 NOTE — Progress Notes (Addendum)
1900-Bedside and Verbal shift change report given to T.Jackson (oncoming nurse) by Kimber Relic  (offgoing nurse). Report included the following information SBAR, Kardex, Intake/Output, MAR and Cardiac Rhythm SR.   2000- Shift assessment performed. See flow sheet. Patient continues on BIPAP, tolerating well . Oxygen saturation 92%.  2200- Patient placed on hi flow. Did not tolerate well . Oxygen desaturation to 82%. Patient asymptomatic. Placed back on BIPAP.  0000- Reassessment performed. See flow sheet.  0400- Reassessment performed. See flow sheet.  0700- Report given to Junious Dresser, Charity fundraiser.

## 2017-10-26 ENCOUNTER — Inpatient Hospital Stay: Admit: 2017-10-26 | Payer: MEDICARE | Primary: Internal Medicine

## 2017-10-26 LAB — GLUCOSE, POC
Glucose (POC): 246 mg/dL — ABNORMAL HIGH (ref 65–100)
Glucose (POC): 153 mg/dL — ABNORMAL HIGH (ref 65–100)
Glucose (POC): 208 mg/dL — ABNORMAL HIGH (ref 65–100)
Glucose (POC): 147 mg/dL — ABNORMAL HIGH (ref 65–100)

## 2017-10-26 LAB — CBC W/O DIFF
ABSOLUTE NRBC: 0.04 10*3/uL — ABNORMAL HIGH (ref 0.00–0.01)
HCT: 39.7 % (ref 35.0–47.0)
HGB: 12.5 g/dL (ref 11.5–16.0)
MCH: 27.3 PG (ref 26.0–34.0)
MCHC: 31.5 g/dL (ref 30.0–36.5)
MCV: 86.7 FL (ref 80.0–99.0)
MPV: 11.5 FL (ref 8.9–12.9)
NRBC: 0.4 PER 100 WBC — ABNORMAL HIGH
PLATELET: 229 10*3/uL (ref 150–400)
RBC: 4.58 M/uL (ref 3.80–5.20)
RDW: 17.4 % — ABNORMAL HIGH (ref 11.5–14.5)
WBC: 9.7 10*3/uL (ref 3.6–11.0)

## 2017-10-26 LAB — POC G3 - PUL
Base excess (POC): 4 mmol/L
FIO2 (POC): 70 %
HCO3 (POC): 29.3 MMOL/L — ABNORMAL HIGH (ref 22–26)
PEEP/CPAP (POC): 10 cmH2O
PIP (POC): 14
Total resp. rate: 22
pCO2 (POC): 51.4 MMHG — ABNORMAL HIGH (ref 35.0–45.0)
pH (POC): 7.363 (ref 7.35–7.45)
pO2 (POC): 65 MMHG — ABNORMAL LOW (ref 80–100)
sO2 (POC): 91 % — ABNORMAL LOW (ref 92–97)

## 2017-10-26 LAB — METABOLIC PANEL, COMPREHENSIVE
A-G Ratio: 0.8 — ABNORMAL LOW (ref 1.1–2.2)
ALT (SGPT): 19 U/L (ref 12–78)
AST (SGOT): 8 U/L — ABNORMAL LOW (ref 15–37)
Albumin: 3.1 g/dL — ABNORMAL LOW (ref 3.5–5.0)
Alk. phosphatase: 75 U/L (ref 45–117)
Anion gap: 4 mmol/L — ABNORMAL LOW (ref 5–15)
BUN/Creatinine ratio: 37 — ABNORMAL HIGH (ref 12–20)
BUN: 53 MG/DL — ABNORMAL HIGH (ref 6–20)
Bilirubin, total: 1.1 MG/DL — ABNORMAL HIGH (ref 0.2–1.0)
CO2: 30 mmol/L (ref 21–32)
Calcium: 7.9 MG/DL — ABNORMAL LOW (ref 8.5–10.1)
Chloride: 105 mmol/L (ref 97–108)
Creatinine: 1.44 MG/DL — ABNORMAL HIGH (ref 0.55–1.02)
GFR est AA: 44 mL/min/{1.73_m2} — ABNORMAL LOW (ref >60)
GFR est non-AA: 36 mL/min/{1.73_m2} — ABNORMAL LOW (ref >60)
Globulin: 3.8 g/dL (ref 2.0–4.0)
Glucose: 197 mg/dL — ABNORMAL HIGH (ref 65–100)
Potassium: 4.9 mmol/L (ref 3.5–5.1)
Protein, total: 6.9 g/dL (ref 6.4–8.2)
Sodium: 139 mmol/L (ref 136–145)

## 2017-10-26 MED ORDER — METHYLPREDNISOLONE (PF) 40 MG/ML IJ SOLR
40 mg/mL | Freq: Every day | INTRAMUSCULAR | Status: DC
Start: 2017-10-26 — End: 2017-10-27

## 2017-10-26 MED FILL — LEVOFLOXACIN IN D5W 750 MG/150 ML IV PIGGY BACK: 750 mg/150 mL | INTRAVENOUS | Qty: 150

## 2017-10-26 MED FILL — HEPARIN (PORCINE) 5,000 UNIT/ML IJ SOLN: 5000 unit/mL | INTRAMUSCULAR | Qty: 2

## 2017-10-26 MED FILL — IPRATROPIUM-ALBUTEROL 2.5 MG-0.5 MG/3 ML NEB SOLUTION: 2.5 mg-0.5 mg/3 ml | RESPIRATORY_TRACT | Qty: 3

## 2017-10-26 MED FILL — SENNA 8.6 MG TABLET: 8.6 mg | ORAL | Qty: 1

## 2017-10-26 MED FILL — ASPIRIN 81 MG TAB, DELAYED RELEASE: 81 mg | ORAL | Qty: 1

## 2017-10-26 MED FILL — SOLU-MEDROL (PF) 40 MG/ML SOLUTION FOR INJECTION: 40 mg/mL | INTRAMUSCULAR | Qty: 1

## 2017-10-26 MED FILL — BD POSIFLUSH NORMAL SALINE 0.9 % INJECTION SYRINGE: INTRAMUSCULAR | Qty: 20

## 2017-10-26 MED FILL — PRAVASTATIN 10 MG TAB: 10 mg | ORAL | Qty: 2

## 2017-10-26 MED FILL — TOPROL XL 25 MG TABLET,EXTENDED RELEASE: 25 mg | ORAL | Qty: 2

## 2017-10-26 NOTE — Progress Notes (Signed)
Finished lunch,reading paper up in chair.

## 2017-10-26 NOTE — Progress Notes (Signed)
[facility] OF [facility], Critical Care, and Sleep Medicine  Name: Brittney Shelton MRN: [id number]   DOB: 02-21-1951 Hospital: [facility]   Date: 10/26/2017        IMPRESSION:   ?? Acute/chronic hypoxic/hypercapnic respiratory failure  ?? Community acquired pneumonia  ?? "COPD exacerbation" - she has a diagnosis of COPD, but she has no obstruction on PFTs; patient of Dr. Jayme Cloud  ?? Acute renal failure  ?? Pulmonary HTN - likely due to diastolic heart failure, but can not r/o other causes  ?? LVH/diastolic heart failure  ?? DM/hyperglycemia  ?? Obesity - ?OSA/OHS      PLAN:   ?? On bipap, high flow as tolerated (4L NC continuous at home)  ?? Bronchodilators  ?? Wean SoluMedrol today  ?? Empiric antibiotics   ?? Will eventually need PH evaluation, including sleep study   ?? Monitor creatinine, stable  ?? Insulin   ?? DVT prophyaxis  ?? NPO  ?? Up to chair today     Subjective/Interval History:   I have reviewed the flowsheet and previous day???s notes.    Denies SOB, cough, wheezing. No pain.   States that she was having nausea due to insulin.   Wants to go home for her family reunion next weekend.   VSS. ABG improved. CXR stable.      Review of Systems   Unable to perform ROS: Acuity of condition     Objective:  Vital Signs:    Visit Vitals  BP 107/49   Pulse 72   Temp 98 ??F (36.7 ??C)   Resp 19   Ht  (1.676 m)   Wt 133.1 kg (293 lb 6.9 oz)   SpO2 92%   BMI 47.36 kg/m??       O2 Device: BIPAP   O2 Flow Rate (L/min): 40 l/min   Temp (24hrs), Avg:97.9 ??F (36.6 ??C), Min:97.7 ??F (36.5 ??C), Max:98 ??F (36.7 ??C)     Intake/Output:   Last shift:      No intake/output data recorded.  Last 3 shifts: 05/25 1901 - 05/27 0700  In: 1360 [P.O.:1190; I.V.:170]  Out: 2725 [Urine:2725]    Intake/Output Summary (Last 24 hours) at 10/26/2017 0840  Last data filed at 10/26/2017 0000  Gross per 24 hour   Intake 1240 ml   Output 2125 ml   Net -885 ml     Hemodynamics:   PAP:   CO:     Wedge:   CI:     CVP:    SVR:        PVR:       Ventilator Settings:  Mode Rate Tidal Volume Pressure FiO2 PEEP            70 %       Peak airway pressure:      Minute ventilation: 6.7 l/min       Physical Exam   Constitutional: No distress. Face mask in place.   HENT:   Head: Normocephalic and atraumatic.   Eyes: No scleral icterus.   Cardiovascular: Normal rate and regular rhythm.   Pulmonary/Chest: Effort normal. She has no wheezes. Rales: very minimal.   Abdominal: Soft. Bowel sounds are normal. She exhibits no distension. There is no tenderness.   Musculoskeletal: She exhibits edema.   Neurological: She is alert.   Skin: Skin is warm and dry.     Data:     Current Facility-Administered Medications   Medication Dose Route Frequency   ???  levoFLOXacin (LEVAQUIN) 750 mg in D5W IVPB  750 mg IntraVENous Q24H   ??? senna (SENOKOT) tablet 8.6 mg  1 Tab Oral DAILY   ??? methylPREDNISolone (PF) (SOLU-MEDROL) injection 20 mg  20 mg IntraVENous Q12H   ??? mupirocin (BACTROBAN) 2 % ointment   Both Nostrils BID   ??? insulin glargine (LANTUS) injection 5 Units  5 Units SubCUTAneous QHS   ??? insulin lispro (HUMALOG) injection   SubCUTAneous AC&HS   ??? sodium chloride (NS) flush 5-40 mL  5-40 mL IntraVENous Q8H   ??? heparin (porcine) injection 7,500 Units  7,500 Units SubCUTAneous Q8H   ??? albuterol-ipratropium (DUO-NEB) 2.5 MG-0.5 MG/3 ML  3 mL Nebulization Q4H RT   ??? aspirin delayed-release tablet 81 mg  81 mg Oral DAILY   ??? pravastatin (PRAVACHOL) tablet 20 mg  20 mg Oral QHS   ??? metoprolol succinate (TOPROL-XL) XL tablet 50 mg  50 mg Oral DAILY                Labs:  Recent Labs     10/26/17  0311 10/25/17  0346 10/24/17  0600   WBC 9.7 10.3 10.6   HGB 12.5 12.3 12.1   HCT 39.7 38.9 38.5   PLT 229 226 250     Recent Labs     10/26/17  0311 10/25/17  0346 10/24/17  0600   NA 139 140 139   K 4.9 4.6 5.0   CL 105 107 106   CO2 GLU 197* 151* 175*   BUN 53* 56* 54*   CREA 1.44* 1.41* 1.58*   CA 7.9* 7.8* 7.9*   MG  --  2.3 2.4   PHOS  --   --  4.4    ALB 3.1* 3.2* 3.2*   TBILI 1.1* 0.9 0.9   SGOT 8* 9* 6*   ALT Recent Labs     10/26/17  0636 10/25/17  0601   PHI 7.363 7.293*   PCO2I 51.4* 57.9*   PO2I 65* 72*   HCO3I 29.3* 28.1*   FIO2I 70 70     Imaging:  I have personally reviewed the patient???s radiographs and have reviewed the reports:  CXR: stable        Total critical care time exclusive of procedures:   minutes  Lawson Fiscal, NP

## 2017-10-26 NOTE — Progress Notes (Signed)
Ambulated up to chair with one assit using her own sneakers and walker.  Tolerated well. O2 sat stayed about 88% on hi flow of 90% at 40 LPM.

## 2017-10-26 NOTE — Progress Notes (Addendum)
1930 Back to bed with minimal assistance. Tolerated activity well.  2035 Sat's 83-84% after getting back to bed. Will put Bi-PAP on. Sat's 94%  0700 Uneventful night. Remained on Bi-PAP. Sat's 94-95 the rest of the night.  0705 Bedside and Verbal shift change report given to [facility]. Report included the following information SBAR, Kardex, ED Summary, Intake/Output, MAR, Recent Results and Cardiac Rhythm NSR.

## 2017-10-26 NOTE — Progress Notes (Signed)
Hospitalist Progress Note  NAME: Brittney Shelton   DOB:  April 07, 1951   MRN:  [id number]       Assessment / Plan:  Acute on chronic respiratory failure with hypoxia POA  Acute on chronic diastolic CHF POA, Cor pulmonale POA  ? Right lower lobe pneumonia  ? COPD exacerbation POA  Worsening DOE and o2 requirements past week prior to admission; hypoxia remains severe and she is clinically tenuous at this time -- starting to look better?  On Levaquin for pneumonia   IV steroids and nebs -- weaning steroids per pulmonology  Agree with careful diuresis, has been net negative daily  Continue support with high-flow nasal cannula, BiPAP PRN  Baseline 4L oxygen requirement  ??  Acute kidney injury POA??BUN/creat 55 and 1.61, unchanged overnight  Urine output is excellent, creatinine stable  ??  Hyperkalemia, mild  Resolved??  ??  Essential HTN POA  Normotensive now; holding Norvasc  ??  DM type 2 POA  Diet controlled at home  Last HgBa1c 6.4 in Dec 2018  Hyperglycemic on steroids; refusing insulin, unfortunately -- may try an oral agent in AM to help control glucose  ??  Right pleural effusion POA  Small, doubt thoracentesis would contribute much  ??  Remote tobacco use quit 20 years ago    40 or above Morbid obesity / Body mass index is 47.36 kg/m??.    Code status: Full  Prophylaxis: Hep SQ  Recommended Disposition: TBD pending improvement clinically     Subjective:     Chief Complaint / Reason for Physician Visit: follow up respiratory failure  Feeling a bit better today. No vomiting last night. Denies dyspnea. Notes pain in the left index finger, blistering there, was told it was "like oral ulcers but on my finger."  Discussed with RN events overnight.     Review of Systems:  Symptom Y/N Comments  Symptom Y/N Comments   Fever/Chills n   Chest Pain n    Poor Appetite    Edema     Cough    Abdominal Pain n    Sputum    Joint Pain     SOB/DOE    Pruritis/Rash     Nausea/vomit n   Tolerating PT/OT     Diarrhea n   Tolerating Diet      Constipation n   Other       Could NOT obtain due to:      Objective:     VITALS:   Last 24hrs VS reviewed since prior progress note. Most recent are:  Patient Vitals for the past 24 hrs:   Temp Pulse Resp BP SpO2   10/26/17 1300 ??? 85 21 ??? 90 %   10/26/17 1200 98 ??F (36.7 ??C) 82 24 124/66 (!) 88 %   10/26/17 1140 ??? ??? ??? ??? (!) 88 %   10/26/17 1100 ??? 80 21 116/71 (!) 86 %   10/26/17 1000 ??? 87 20 108/70 (!) 85 %   10/26/17 0932 ??? ??? ??? ??? (!) 85 %   10/26/17 0900 ??? 89 20 (!) 89/57 91 %   10/26/17 0800 97.4 ??F (36.3 ??C) 72 19 117/61 93 %   10/26/17 0726 ??? ??? ??? ??? 92 %   10/26/17 0700 ??? 68 17 126/77 92 %   10/26/17 0400 98 ??F (36.7 ??C) 72 19 107/49 93 %   10/26/17 0329 ??? ??? ??? ??? 92 %   10/26/17 0300 ??? 71 20 107/57 92 %  10/26/17 0200 ??? 74 21 104/51 (!) 89 %   10/26/17 0100 ??? 75 19 111/53 (!) 88 %   10/26/17 0001 97.7 ??F (36.5 ??C) 75 17 111/58 93 %   10/26/17 0000 ??? 81 21 ??? 93 %   10/25/17 2327 ??? ??? ??? ??? 92 %   10/25/17 2300 ??? 74 18 126/56 (!) 89 %   10/25/17 2200 ??? 77 23 106/54 (!) 85 %   10/25/17 2100 ??? 76 22 115/63 91 %   10/25/17 2015 ??? ??? ??? ??? 92 %   10/25/17 2000 97.7 ??F (36.5 ??C) 74 23 119/65 (!) 87 %   10/25/17 1835 ??? 79 21 119/65 96 %   10/25/17 1800 ??? 78 20 ??? (!) 87 %   10/25/17 1700 ??? 79 18 122/65 95 %   10/25/17 1600 ??? 77 21 117/58 91 %   10/25/17 1552 97.9 ??F (36.6 ??C) ??? ??? ??? 92 %   10/25/17 1515 ??? ??? ??? ??? (!) 89 %       Intake/Output Summary (Last 24 hours) at 10/26/2017 1505  Last data filed at 10/26/2017 1153  Gross per 24 hour   Intake 580 ml   Output 825 ml   Net -245 ml        PHYSICAL EXAM:  General: WD, WN. Alert, cooperative, no acute distress, morbidly obese  EENT:  EOMI. Anicteric sclerae. MMM  Resp:  Exam limited by habitus, no frank wheezing or rales  CV:  Regular  Rhythm,??+ edema  GI:  Soft, Non distended, Non tender. ??+Bowel sounds  Neurologic:?? Alert and oriented X 3, normal speech,   Psych:???? Fair insight.??Not anxious nor agitated  Skin:  No rashes.  No jaundice. Colored vesicles? On left index finger     Reviewed most current lab test results and cultures  YES  Reviewed most current radiology test results   YES  Review and summation of old records today    NO  Reviewed patient's current orders and MAR    YES  PMH/SH reviewed - no change compared to H&P  ________________________________________________________________________  Care Plan discussed with:    Comments   Patient x    Family      RN     Care Manager     Consultant                        Multidiciplinary team rounds were held today with case manager, nursing, pharmacist and clinical coordinator.  Patient's plan of care was discussed; medications were reviewed and discharge planning was addressed.     ________________________________________________________________________  Total NON critical care TIME:  20   Minutes    Total CRITICAL CARE TIME Spent:   Minutes non procedure based      Comments   >50% of visit spent in counseling and coordination of care     ________________________________________________________________________  Margie Ege, MD     Procedures: see electronic medical records for all procedures/Xrays and details which were not copied into this note but were reviewed prior to creation of Plan.      LABS:  I reviewed today's most current labs and imaging studies.  Pertinent labs include:  Recent Labs     10/26/17  0311 10/25/17  0346 10/24/17  0600   WBC 9.7 10.3 10.6   HGB 12.5 12.3 12.1   HCT 39.7 38.9 38.5   PLT 229 226 250     Recent Labs  10/26/17  0311 10/25/17  0346 10/24/17  0600   NA 139 140 139   K 4.9 4.6 5.0   CL 105 107 106   CO2 GLU 197* 151* 175*   BUN 53* 56* 54*   CREA 1.44* 1.41* 1.58*   CA 7.9* 7.8* 7.9*   MG  --  2.3 2.4   PHOS  --   --  4.4   ALB 3.1* 3.2* 3.2*   TBILI 1.1* 0.9 0.9   SGOT 8* 9* 6*   ALT Signed: Margie Ege, MD

## 2017-10-26 NOTE — Progress Notes (Signed)
Comfortable in chair.  Talking on telephone.

## 2017-10-27 LAB — GLUCOSE, POC
Glucose (POC): 128 mg/dL — ABNORMAL HIGH (ref 65–100)
Glucose (POC): 107 mg/dL — ABNORMAL HIGH (ref 65–100)
Glucose (POC): 196 mg/dL — ABNORMAL HIGH (ref 65–100)
Glucose (POC): 182 mg/dL — ABNORMAL HIGH (ref 65–100)

## 2017-10-27 MED ORDER — ENOXAPARIN 40 MG/0.4 ML SUB-Q SYRINGE
40 mg/0.4 mL | Freq: Two times a day (BID) | SUBCUTANEOUS | Status: DC
Start: 2017-10-27 — End: 2017-11-03
  Administered 2017-10-27 – 2017-11-03 (×14): via SUBCUTANEOUS

## 2017-10-27 MED ORDER — PREDNISONE 20 MG TAB
20 mg | Freq: Every day | ORAL | Status: DC
Start: 2017-10-27 — End: 2017-11-03
  Administered 2017-10-27 – 2017-11-02 (×7): via ORAL

## 2017-10-27 MED ORDER — METOPROLOL SUCCINATE SR 25 MG 24 HR TAB
25 mg | Freq: Every day | ORAL | Status: DC
Start: 2017-10-27 — End: 2017-11-05
  Administered 2017-10-28 – 2017-11-05 (×7): via ORAL

## 2017-10-27 MED FILL — ASPIRIN 81 MG TAB, DELAYED RELEASE: 81 mg | ORAL | Qty: 1

## 2017-10-27 MED FILL — IPRATROPIUM-ALBUTEROL 2.5 MG-0.5 MG/3 ML NEB SOLUTION: 2.5 mg-0.5 mg/3 ml | RESPIRATORY_TRACT | Qty: 3

## 2017-10-27 MED FILL — BISAC-EVAC 10 MG RECTAL SUPPOSITORY: 10 mg | RECTAL | Qty: 1

## 2017-10-27 MED FILL — ENOXAPARIN 40 MG/0.4 ML SUB-Q SYRINGE: 40 mg/0.4 mL | SUBCUTANEOUS | Qty: 0.4

## 2017-10-27 MED FILL — TOPROL XL 25 MG TABLET,EXTENDED RELEASE: 25 mg | ORAL | Qty: 2

## 2017-10-27 MED FILL — MAPAP (ACETAMINOPHEN) 325 MG TABLET: 325 mg | ORAL | Qty: 2

## 2017-10-27 MED FILL — SENNA 8.6 MG TABLET: 8.6 mg | ORAL | Qty: 1

## 2017-10-27 MED FILL — BD POSIFLUSH NORMAL SALINE 0.9 % INJECTION SYRINGE: INTRAMUSCULAR | Qty: 10

## 2017-10-27 MED FILL — BD POSIFLUSH NORMAL SALINE 0.9 % INJECTION SYRINGE: INTRAMUSCULAR | Qty: 20

## 2017-10-27 MED FILL — PREDNISONE 20 MG TAB: 20 mg | ORAL | Qty: 1

## 2017-10-27 MED FILL — PRAVASTATIN 10 MG TAB: 10 mg | ORAL | Qty: 2

## 2017-10-27 MED FILL — LEVOFLOXACIN IN D5W 750 MG/150 ML IV PIGGY BACK: 750 mg/150 mL | INTRAVENOUS | Qty: 150

## 2017-10-27 MED FILL — HEPARIN (PORCINE) 5,000 UNIT/ML IJ SOLN: 5000 unit/mL | INTRAMUSCULAR | Qty: 2

## 2017-10-27 NOTE — Progress Notes (Signed)
Hospitalist Progress Note  NAME: Brittney Shelton   DOB:  Jun 08, 1950   MRN:  [id number]       Assessment / Plan:  Acute on chronic respiratory failure with hypoxia POA  Acute on chronic diastolic CHF POA, Cor pulmonale POA  Right lower lobe pneumonia  COPD exacerbation POA  -clinically with very minimal improvement; on high O2 flow and bipap prn   Baseline 4L oxygen requirement  -On Levaquin for pneumonia   -IV steroids and nebs -- weaning steroids per pulmonology  -diuresing   Continue support with high-flow nasal cannula, BiPAP PRN  ??  Acute kidney injury POA  -last normal Cr 09/2017, admission 1.6 --> stable at 1.4 now   Good UO   Unclear exact etiology  -monitor closely. Avoid nephrotoxic drugs, adjust all meds to GFR.   ??  Essential HTN POA  -BP mostly at 110s -->  holding Norvasc  ??  DM type 2 POA  Diet controlled at home. BS acceptable now   Last HgBa1c 6.4 in Dec 2018  Hyperglycemic on steroids; refusing insulin, unfortunately   ??  Right pleural effusion POA  Small, doubt thoracentesis would contribute much  ??  Hyperkalemia, mild, resolved   Remote tobacco use quit 20 years ago  40 or above Morbid obesity / Body mass index is 46.26 kg/m??.    Code status: Full  Prophylaxis: Hep SQ     Recommended Disposition: remain in ICU      Subjective:     Chief Complaint / Reason for Physician Visit: follow up respiratory failure/ copd   Pt was OOB to chair  Reports feeling better  She got very emotional. She was hoping to be able to attend family re union this coming Saturday. Informed pt that looking at her current clinical condition, she is unlikely to be dc by that time.   On high flow O2 and bipap prn     Discussed with RN events overnight.     Review of Systems:  Symptom Y/N Comments  Symptom Y/N Comments   Fever/Chills n   Chest Pain n    Poor Appetite    Edema     Cough    Abdominal Pain n    Sputum    Joint Pain     SOB/DOE    Pruritis/Rash     Nausea/vomit n   Tolerating PT/OT     Diarrhea n   Tolerating Diet      Constipation n   Other       Could NOT obtain due to:      Objective:     VITALS:   Last 24hrs VS reviewed since prior progress note. Most recent are:  Patient Vitals for the past 24 hrs:   Temp Pulse Resp BP SpO2   10/27/17 1905 ??? ??? ??? ??? 95 %   10/27/17 1830 ??? ??? ??? ??? 91 %   10/27/17 1828 ??? ??? ??? ??? (!) 87 %   10/27/17 1800 ??? 84 25 107/57 92 %   10/27/17 1700 ??? 79 21 135/69 92 %   10/27/17 1600 ??? 81 20 142/90 95 %   10/27/17 1549 ??? ??? ??? ??? 95 %   10/27/17 1500 ??? 82 22 144/90 91 %   10/27/17 1425 ??? ??? ??? ??? 95 %   10/27/17 1408 98 ??F (36.7 ??C) 90 25 125/62 (!) 87 %   10/27/17 1300 ??? 87 19 133/76 (!) 87 %   10/27/17 1200 ???  85 23 116/72 (!) 88 %   10/27/17 1136 ??? ??? ??? ??? 93 %   10/27/17 1100 97.7 ??F (36.5 ??C) 80 22 112/65 91 %   10/27/17 1006 ??? 89 ??? (!) 133/98 ???   10/27/17 1000 ??? 81 18 117/66 (!) 88 %   10/27/17 0959 ??? 78 ??? 117/66 ???   10/27/17 0821 ??? ??? ??? ??? 90 %   10/27/17 0800 ??? 75 18 110/67 (!) 84 %   10/27/17 0700 97.7 ??F (36.5 ??C) 68 19 93/54 (!) 87 %   10/27/17 0600 ??? 76 21 101/69 94 %   10/27/17 0500 ??? 73 20 99/60 92 %   10/27/17 0400 97.9 ??F (36.6 ??C) 71 19 111/55 94 %   10/27/17 0338 ??? ??? ??? ??? 94 %   10/27/17 0301 ??? ??? ??? ??? 94 %   10/27/17 0300 97.9 ??F (36.6 ??C) 70 20 107/50 90 %   10/27/17 0200 ??? 72 20 110/63 93 %   10/27/17 0100 ??? 76 19 113/75 95 %   10/27/17 0000 97.6 ??F (36.4 ??C) 78 20 96/57 95 %   10/26/17 2333 ??? ??? ??? ??? 95 %   10/26/17 2307 ??? ??? ??? ??? 95 %   10/26/17 2300 ??? 74 20 114/64 92 %   10/26/17 2200 ??? 78 17 127/80 95 %   10/26/17 2100 ??? 78 22 115/67 95 %   10/26/17 2041 ??? ??? ??? ??? 95 %   10/26/17 2000 97.9 ??F (36.6 ??C) 82 22 118/61 (!) 85 %       Intake/Output Summary (Last 24 hours) at 10/27/2017 1915  Last data filed at 10/27/2017 1800  Gross per 24 hour   Intake 1210 ml   Output 1050 ml   Net 160 ml        PHYSICAL EXAM:  General: WD, WN. Alert, cooperative, no acute distress, morbidly obese  EENT:  EOMI. Anicteric sclerae. MMM  Resp:  Exam limited by habitus, no wheezing or rales  CV:  Regular  Rhythm,??+ edema   GI:  Soft, Non distended, Non tender. ??+Bowel sounds  Neurologic:?? Alert and oriented X 3, normal speech,   Psych:???? Fair insight.??Not anxious nor agitated    Reviewed most current lab test results and cultures  YES  Reviewed most current radiology test results   YES  Review and summation of old records today    NO  Reviewed patient's current orders and MAR    YES  PMH/SH reviewed - no change compared to H&P  ________________________________________________________________________  Care Plan discussed with:    Comments   Patient y    Family      RN y    Buyer, retail                        Multidiciplinary team rounds were held today with case manager, nursing, pharmacist and Higher education careers adviser.  Patient's plan of care was discussed; medications were reviewed and discharge planning was addressed.     ________________________________________________________________________  Total NON critical care TIME:  20   Minutes    Total CRITICAL CARE TIME Spent:   Minutes non procedure based      Comments   >50% of visit spent in counseling and coordination of care     ________________________________________________________________________  Guerry Bruin, MD     Procedures: see electronic medical records for all procedures/Xrays and details which were not copied into  this note but were reviewed prior to creation of Plan.      LABS:  I reviewed today's most current labs and imaging studies.  Pertinent labs include:  Recent Labs     10/26/17  0311 10/25/17  0346   WBC 9.7 10.3   HGB 12.5 12.3   HCT 39.7 38.9   PLT 229 226     Recent Labs     10/26/17  0311 10/25/17  0346   NA 139 140   K 4.9 4.6   CL 105 107   CO2 30 28   GLU 197* 151*   BUN 53* 56*   CREA 1.44* 1.41*   CA 7.9* 7.8*   MG  --  2.3   ALB 3.1* 3.2*   TBILI 1.1* 0.9   SGOT 8* 9*   ALT 19 24       Signed: Guerry Bruin, MD

## 2017-10-27 NOTE — Progress Notes (Signed)
OT Note    Orders received and chart reviewed. Attempted to see Pt for OT evaluation yet she is currently eating breakfast. Will return to complete evaluation this morning.    Thank you,    Benay Pillow, OTR/L

## 2017-10-27 NOTE — Progress Notes (Signed)
Problem: Mobility Impaired (Adult and Pediatric)  Goal: *Acute Goals and Plan of Care (Insert Text)  Description  Physical Therapy Goals  Initiated 10/27/2017  1.  Patient will move from supine to sit and sit to supine  in bed with modified independence within 7 day(s).    2.  Patient will transfer from bed to chair and chair to bed with modified independence using the least restrictive device within 7 day(s).  3.  Patient will perform sit to stand with modified independence within 7 day(s).  4.  Patient will ambulate with modified independence for 50 feet with the least restrictive device within 7 day(s).   5.  Patient will ascend/descend 4 stairs with bilateral handrail(s) with supervision/set-up within 7 day(s).     Outcome: Progressing Towards Goal    PHYSICAL THERAPY EVALUATION  Patient: Brittney Shelton (67 y.o. female)  Date: 10/27/2017  Primary Diagnosis: Acute respiratory failure with hypoxemia (HCC) [J96.01]        Precautions:   Fall    ASSESSMENT :  Based on the objective data described below, the patient presents with decreased tolerance to activity, decreased functional mobility s/p admission for acute respiratory failure. Pt received seated in the chair upon arrival to room. Pt on Hi Flow 45L of oxygen and spO2: 98% at rest and 92% during activity. Pt completed sit<>stand with RW with CGA and was able to march in place with RW for UE support with standby assist. Further gait limited due to hi flow machine. Pt reported at baseline, she is ambulatory with a cane or RW, lives with her brother in a 1 story home and wears 4L of oxygen. Pt will continue to benefit from PT to progress mobility as tolerated and assist with returning to prior level of function. At this time recommend pt return home with HHPT pending titration of supplemental oxygen.    Patient will benefit from skilled intervention to address the above impairments.  Patient?s rehabilitation potential is considered to be Good   Factors which may influence rehabilitation potential include:   ?         None noted  ?         Mental ability/status  ?         Medical condition  ?         Home/family situation and support systems  ?         Safety awareness  ?         Pain tolerance/management  ?         Other:      PLAN :  Recommendations and Planned Interventions:  ?           Bed Mobility Training             ?    Neuromuscular Re-Education  ?           Transfer Training                   ?    Orthotic/Prosthetic Training  ?           Gait Training                         ?    Modalities  ?           Therapeutic Exercises           ?    Edema Management/Control  ?  Therapeutic Activities            ?    Patient and Family Training/Education  ?           Other (comment):    Frequency/Duration: Patient will be followed by physical therapy  4 times a week to address goals.  Discharge Recommendations: Home Health pending hospital course  Further Equipment Recommendations for Discharge: none      SUBJECTIVE:   Patient stated ?I'm ready to go home.?    OBJECTIVE DATA SUMMARY:   HISTORY:    Past Medical History:   Diagnosis Date    Chronic obstructive pulmonary disease (HCC)     Congestive heart failure (HCC)     Diabetes (HCC)     Hypertension    History reviewed. No pertinent surgical history.  Prior Level of Function/Home Situation: see above  Personal factors and/or comorbidities impacting plan of care:     Home Situation  Home Environment: Private residence  # Steps to Enter: 4  Rails to Enter: Yes  Hand Rails : Bilateral  One/Two Story Residence: One story  Living Alone: No  Support Systems: Family member(s)(brother)  Patient Expects to be Discharged to:: Private residence  Current DME Used/Available at Home: Gilmer Mor, straight, Environmental consultant, rolling, Oxygen, portable(4L)  Tub or Shower Type: Tub/Shower combination  EXAMINATION/PRESENTATION/DECISION MAKING:   Critical Behavior:  Neurologic State: Alert  Orientation Level: Oriented X4   Cognition: Appropriate for age attention/concentration, Follows commands     Hearing:  Auditory  Auditory Impairment: None  Skin:    Edema:   Range Of Motion:  AROM: Generally decreased, functional(BLEs; UEs WFL)                       Strength:    Strength: Generally decreased, functional                    Tone & Sensation:   Tone: Normal              Sensation: Intact(in BUEs)               Coordination:  Coordination: Within functional limits  Vision:   Acuity: Within Defined Limits  Corrective Lenses: Glasses  Functional Mobility:  Bed Mobility:     Supine to Sit: (NT--pt received and returned seated in chair)        Transfers:  Sit to Stand: Contact guard assistance  Stand to Sit: Contact guard assistance                       Balance:   Sitting: Intact;With support  Standing: Intact;With support  Ambulation/Gait Training:                                                         Stairs:              Therapeutic Exercises:       Functional Measure:  Barthel Index:  Bathing: 0  Bladder: 0(foley)  Bowels: 10  Grooming: 5  Dressing: 5  Feeding: 10  Mobility: 0  Stairs: 0  Toilet Use: 5  Transfer (Bed to Chair and Back): 5  Total: 40/100      Percentage of impairment   0%   1-19%   20-39%  40-59%   60-79%   80-99%   100%   Barthel Score 0-100 100 99-80 79-60 59-40 20-39 1-19   0     The Barthel ADL Index: Guidelines  1. The index should be used as a record of what a patient does, not as a record of what a patient could do.  2. The main aim is to establish degree of independence from any help, physical or verbal, however minor and for whatever reason.  3. The need for supervision renders the patient not independent.  4. A patient's performance should be established using the best available evidence. Asking the patient, friends/relatives and nurses are the usual sources, but direct observation and common sense are also important. However direct testing is not needed.   5. Usually the patient's performance over the preceding 24-48 hours is important, but occasionally longer periods will be relevant.  6. Middle categories imply that the patient supplies over 50 per cent of the effort.  7. Use of aids to be independent is allowed.    Clarisa Kindred., Barthel, D.W. [phone]). Functional evaluation: the Barthel Index. Md State Med J (14)2.  Zenaida Niece der [place], J.J.M.F, [place], Ian Malkin., Margret Chance., [place], [place]. (1999). Measuring the change indisability after inpatient rehabilitation; comparison of the responsiveness of the Barthel Index and Functional Independence Measure. Journal of Neurology, Neurosurgery, and Psychiatry, 66(4), [phone].  Dawson Bills, N.J.A, [place],  W.J.M, & Koopmanschap, M.A. (2004.) Assessment of post-stroke quality of life in cost-effectiveness studies: The usefulness of the Barthel Index and the EuroQoL-5D. Quality of Life Research, 13, [phone]        Based on the above components, the patient evaluation is determined to be of the following complexity level: LOW     Pain:  Pain Scale 1: Numeric (0 - 10)  Pain Intensity 1: 0              Activity Tolerance:   Fair. Pt requiring 45L of oxygen on hi flow  Please refer to the flowsheet for vital signs taken during this treatment.  After treatment:   ?         Patient left in no apparent distress sitting up in chair  ?         Patient left in no apparent distress in bed  ?         Call bell left within reach  ?         Nursing notified  ?         Caregiver present  ?         Bed alarm activated    COMMUNICATION/EDUCATION:   The patient?s plan of care was discussed with: Occupational Therapist and Registered Nurse.  ?         Fall prevention education was provided and the patient/caregiver indicated understanding.  ?         Patient/family have participated as able in goal setting and plan of care.  ?         Patient/family agree to work toward stated goals and plan of care.   ?         Patient understands intent and goals of therapy, but is neutral about his/her participation.  ?         Patient is unable to participate in goal setting and plan of care.    Thank you for this referral.  Hamilton Capri, PT, DPT   Time Calculation: 18 mins

## 2017-10-27 NOTE — Progress Notes (Signed)
Problem: Self Care Deficits Care Plan (Adult)  Goal: *Acute Goals and Plan of Care (Insert Text)  Description  Occupational Therapy Goals  Initiated 10/27/2017  1.  Patient will perform grooming tasks standing at the sink with modified independence within 7 day(s).  2.  Patient will perform lower body dressing with modified independence within 7 day(s).  3.  Patient will perform toilet transfers with modified independence using least restrictive device within 7 day(s).  4.  Patient will perform all aspects of toileting with modified independence within 7 day(s).  5.  Patient will perform safe item retrieval in preparation for ADL/self-care tasks with Mod I (while managing O2 extension cord in small functional spaces) and using least restrictive device within 7 day(s).   6.  Patient will utilize activity pacing and energy conservation techniques during functional activities without verbal cues within 7 day(s).       Outcome: Progressing Towards Goal   OCCUPATIONAL THERAPY EVALUATION  Patient: Brittney Shelton [phone] y.o. female)  Date: 10/27/2017  Primary Diagnosis: Acute respiratory failure with hypoxemia (HCC) [J96.01]        Precautions: Fall    ASSESSMENT :  Based on the objective data described below, the patient presents with decreased functional activity tolerance and is now on 45 L/min Hi Flow O2 via nasal cannula this session following episode of acute respiratory failure. Pt was sent to ED from her PCP d/t c/o worsening SOB and after her O2 sats were 75% on 4 L/min O2, which is Pts baseline O2 level 24/7. Pt received sitting in chair on arrival. See vitals for session below. Retired; lives with her brother in one story home. Pt reports Mod I with all ADLs and some IADL tasks (laundry/light housekeeping) using RW for functional mobility. No longer drives. Pt donned her Financial controller with Setup Assist, sliding B feet into the shoes with elastic laces while  seated. UE ROM/strength are [facility]. Pt is currently performing UB ADLs with Setup Assist and LB ADLs with CGA for standing aspects using RW. She maintained good static standing balance at RW yet unable to assess dynamic standing balance and toilet transfer 2/2 limited mobility with Hi Flow NC machine this session. Pt appears close to her baseline function yet would benefit from additional skilled acute OT to ensure her safety and independence with performing her dynamic ADL routine and maximizing her functional activity tolerance with activity pacing/energy conservation education.      10/27/17 0959 10/27/17 1006   BP: 117/66 (!) 133/98   BP 1 Location: Left arm Left arm   BP Patient Position: At rest;Sitting;Pre-activity Standing   Pulse: 78 89   O2 Device Hi Flow Nasal Cannula Hi Flow Nasal Cannula   O2 Flow Rate (L/min) 45 45   FIO2 (%) 97% 92%     Patient will benefit from skilled intervention to address the above impairments.  Patient?s rehabilitation potential is considered to be Fair  Factors which may influence rehabilitation potential include:   ?             None noted  ?             Mental ability/status  ?             Medical condition  ?             Home/family situation and support systems  ?             Safety awareness  ?  Pain tolerance/management  ?             Other:      PLAN :  Recommendations and Planned Interventions:  ?               Self Care Training                  ?        Therapeutic Activities  ?               Functional Mobility Training    ?        Cognitive Retraining  ?               Therapeutic Exercises           ?        Endurance Activities  ?               Balance Training                   ?        Neuromuscular Re-Education  ?               Visual/Perceptual Training     ?   Home Safety Training  ?               Patient Education                 ?        Family Training/Education  ?               Other (comment):     Frequency/Duration: Patient will be followed by occupational therapy 4 times a week to address goals.  Discharge Recommendations: To Be Determined; Possibly HH OT  Further Equipment Recommendations for Discharge: TBD Based on progress     SUBJECTIVE:   Patient stated ?I get my shoes on my own self"    OBJECTIVE DATA SUMMARY:   HISTORY:   Past Medical History:   Diagnosis Date    Chronic obstructive pulmonary disease (HCC)     Congestive heart failure (HCC)     Diabetes (HCC)     Hypertension    History reviewed. No pertinent surgical history.    Prior Level of Function/Environment/Context: Retired. Lives with brother (also retired) in one story home with 4 STE. Reports Mod I with ADL/some IADL tasks (laundry and light house keeping) using RW for mobility at baseline. At baseline Pt does not wear socks and dons Sketchers with elastic laces by placing them on the floor and sliding her feet inside vs bending forward/bringing feet to her knees. No longer drives. Relies on family for transpiration to appointments. Brother cooks all meals yet Pt says she can fix herself a simple meal/sandwich if needed. On home O2 4 liters at baseline 24/7. Retired Engineer, site for elderly.     Home Situation  Home Environment: Private residence  # Steps to Enter: 4  Rails to Enter: Yes  Hand Rails : Bilateral  One/Two Story Residence: One story  Living Alone: No  Support Systems: Family member(s)(brother)  Patient Expects to be Discharged to:: Private residence  Current DME Used/Available at Home: Cane, straight, Environmental consultant, rolling, Oxygen, portable(4L)  Tub or Shower Type: Tub/Shower combination    Hand dominance: Right    EXAMINATION OF PERFORMANCE DEFICITS:  Cognitive/Behavioral Status:  Neurologic State: Alert  Orientation Level: Oriented X4  Cognition: Appropriate for age attention/concentration;Follows commands  Perception: Appears intact  Perseveration: No perseveration noted     Skin: Lines intact     Edema: Significant edema observed at bilat ankles    Hearing:  Auditory  Auditory Impairment: None    Vision/Perceptual:    Acuity: Within Defined Limits    Corrective Lenses: Glasses  Range of Motion:  AROM: Generally decreased, functional(BLEs; UEs WFL)     Strength:  Strength: Generally decreased, functional        Coordination:  Coordination: Within functional limits    Tone & Sensation:  Tone: Normal  Sensation: Intact(in BUEs)  Balance:  Sitting: Intact;With support  Standing: Intact;With support  Functional Mobility and Transfers for ADLs:  Bed Mobility:  Supine to Sit: (NT--pt received and returned seated in chair)    Transfers:  Sit to Stand: Contact guard assistance  Stand to Sit: Contact guard assistance  Bathroom Mobility: Contact guard assistance  Toilet Transfer : Contact guard assistance  Tub Transfer: Contact guard assistance    ADL Assessment:  Feeding: Independent    Oral Facial Hygiene/Grooming: Contact guard assistance(standing at sink)    Bathing: Setup;Contact guard assistance    Upper Body Dressing: Setup    Lower Body Dressing: Setup;Contact guard assistance(Setup with items; CGA for standing aspects)    Toileting: Contact guard assistance(for standing aspects)     ADL Intervention and task modifications:  Lower Body Dressing Assistance  Shoes with Elastic Laces: Set-up(seated in chair, sliding B feet into shoes at floor level)       Functional Measure:  Barthel Index:  Bathing: 0  Bladder: 0(foley)  Bowels: 10  Grooming: 5  Dressing: 5  Feeding: 10  Mobility: 0  Stairs: 0  Toilet Use: 5  Transfer (Bed to Chair and Back): 5  Total: 40/100       Percentage of impairment   0%   1-19%   20-39%   40-59%   60-79%   80-99%   100%   Barthel Score 0-100 100 99-80 79-60 59-40 20-39 1-19   0     The Barthel ADL Index: Guidelines  1. The index should be used as a record of what a patient does, not as a record of what a patient could do.   2. The main aim is to establish degree of independence from any help, physical or verbal, however minor and for whatever reason.  3. The need for supervision renders the patient not independent.  4. A patient's performance should be established using the best available evidence. Asking the patient, friends/relatives and nurses are the usual sources, but direct observation and common sense are also important. However direct testing is not needed.  5. Usually the patient's performance over the preceding 24-48 hours is important, but occasionally longer periods will be relevant.  6. Middle categories imply that the patient supplies over 50 per cent of the effort.  7. Use of aids to be independent is allowed.    Clarisa Kindred., Barthel, D.W. [phone]). Functional evaluation: the Barthel Index. Md State Med J (14)2.  Zenaida Niece der [place], J.J.M.F, [place], Ian Malkin., Margret Chance., [place], [place]. (1999). Measuring the change indisability after inpatient rehabilitation; comparison of the responsiveness of the Barthel Index and Functional Independence Measure. Journal of Neurology, Neurosurgery, and Psychiatry, 66(4), [phone].  Dawson Bills, N.J.A, [place],  W.J.M, & [place], M.A. (2004.) Assessment of post-stroke quality of life in cost-effectiveness studies: The usefulness of the Barthel Index and the EuroQoL-5D. Quality of Life Research, 13, [phone]  Occupational Therapy Evaluation Charge Determination   History Examination Decision-Making   LOW Complexity : Brief history review  LOW Complexity : 1-3 performance deficits relating to physical, cognitive , or psychosocial skils that result in activity limitations and / or participation restrictions  LOW Complexity : No comorbidities that affect functional and no verbal or physical assistance needed to complete eval tasks       Based on the above components, the patient evaluation is determined to be of the following complexity level: LOW   Activity Tolerance:    Limited d/t increased dependence on Hi Flow Nasal Cannula at 45 L/min this session. Pt reports feeling close to her baseline with functional activity tolerance.      Please refer to the flowsheet for vital signs taken during this treatment.  After treatment:   ? Patient left in no apparent distress sitting up in chair  ? Patient left in no apparent distress in bed  ? Call bell left within reach  ? Nursing notified  ? Caregiver present  ? Bed alarm activated    COMMUNICATION/EDUCATION:   The patient?s plan of care was discussed with: Physical Therapist, Registered Nurse and Patient .  ? Home safety education was provided and the patient/caregiver indicated understanding.  ? Patient/family have participated as able in goal setting and plan of care.  ? Patient/family agree to work toward stated goals and plan of care.  ? Patient understands intent and goals of therapy, but is neutral about his/her participation.  ? Patient is unable to participate in goal setting and plan of care.    Thank you for this referral.  Benay Pillow, OTR/L  Time Calculation: 18 mins

## 2017-10-27 NOTE — Progress Notes (Addendum)
2025 Up to bedside commode with two nurses in assistance. Had small loose bowel movement.  2050 Back to bed with two nurses in assistance. Tolerated activity fair. SOB on exertion. Sat's dropped into 70's. Encouraged to breathe in through her nose and out through her mouth. Slowly her Sat's came back up to 90's.  0700 Uneventful night. Remained on Hi Flow O2 all night.  Bedside and Verbal shift change report given to Taylar RN . Report included the following information SBAR, Kardex, ED Summary, Intake/Output, MAR, Recent Results and Cardiac Rhythm NSR.

## 2017-10-27 NOTE — Progress Notes (Addendum)
0700-Bedside shift change report given to Marcelino Duster (Cabin crew) by Agustin Cree (offgoing nurse). Report included the following information SBAR, Kardex and Accordion.   0730- Patient moved OOB to chair.  Standing weight obtained.  BIPAP taken of and replaced with high flow oxygen.  1610- Patient assessed.  See flowsheet.  1141- Changes to assessment include crackles in right lower base.  1423- Patient given two tylenol, placed back to bed, and on her BIPAP to sleep. Reassessed.  No changes.   1630- Patient placed back on high flow oxygen.   1745- PRN bisacodyl suppository given for constipation.  1900- Report given to oncoming shift.

## 2017-10-27 NOTE — Progress Notes (Signed)
Nutrition:  Chart reviewed, case discussed during CCU rounds.  Did not see any indication for GI Lite diet.  Per discussion with Jayme Cloud, okay to advance to Diabetic, Low K diet.     Jackie Plum RD, CNSC  Pager [phone]

## 2017-10-27 NOTE — Other (Deleted)
Dr Rometta Emery:    Patient admitted with Acute on chronic respiratory failure.   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  5/24 CARD consult:   severe hypoxia & COPD NOT 2/2 diastolic heart failure.    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   Risk Factors: obesity ? OSA?OHS, Pulmonary HTN -   LVH/diastolic heart failure    Clinical Indicators: Card notes 'Patient not in acute heart failure and left atrium is not dilated on ECHO, which it would need to be for left heart pressures to be high enough to impact pulmonary HTN like patient's current values (PASP 96.6).  EF 61-65%.   Diuretics restarted by pulmonary and IVF stopped  ??   Treatment: cardiology recommendations for RHC could be helpful. ??Nitric oxide only at [facility].   ?? Recommend out patient evaluation with Dr. Fraser Din (pulmonary hypertension specialist.    Thank you, Charlyne Petrin, RN, CDMP    X [phone]

## 2017-10-27 NOTE — Progress Notes (Signed)
Spiritual Care Assessment/Progress Note  [facility]      NAME: Brittney Shelton      MRN: [id number]  AGE: 67 y.o. SEX: female  Religious Affiliation: Ephriam Knuckles   Language: English     10/27/2017     Total Time (in minutes): 13     Spiritual Assessment begun in [facility] 2 CRITICAL CARE 1 through conversation with:         Patient         Family     Friend(s)        Reason for Consult: Request by staff     Spiritual beliefs: (Please include comment if needed)      Identifies with a faith tradition:    Christian      Supported by a faith community:             Claims no spiritual orientation:            Seeking spiritual identity:                 Adheres to an individual form of spirituality:            Not able to assess:                           Identified resources for coping:       Prayer                                Music                   Guided Imagery      Family/friends                  Pet visits      Devotional reading                          Unknown      Other:                                               Interventions offered during this visit: (See comments for more details)    Patient Interventions: Affirmation of faith, Affirmation of emotions/emotional suffering, Iconic (affirming the presence of God/Higher Power), Initial/Spiritual assessment, Critical care, Normalization of emotional/spiritual concerns, Prayer (actual), Prayer (assurance of)           Plan of Care:      Support spiritual and/or cultural needs     Support AMD and/or advance care planning process       Support grieving process    Coordinate Rites and/or Rituals     Coordination with community clergy    No spiritual needs identified at this time    Detailed Plan of Care below (See Comments)   Make referral to Music Therapy   Make referral to Pet Therapy      Make referral to Addiction services   Make referral to [facility] Passages    Make referral to Spiritual Care Partner   No future visits requested         Follow up visits as needed     Comments:   Responded to request from [place], [place] to  offer pastoral support to patient in CCU as patient is having a tearful morning.  No visitors present.  Patient was seated in recliner looking sad.  She indicated she is having a difficult morning, but did not want to talk about it.  Her brother visited earlier this morning.  She shared that she has good spiritual support.  Shared prayer with patient and advised her of chaplain availability.    Barrie Lyme, MPS, [facility], Chaplain  [facility] Paging Service  [phone])

## 2017-10-27 NOTE — Progress Notes (Signed)
[facility], Critical Care, and Sleep Medicine  Name: Brittney Shelton MRN: [id number]   DOB: 06/08/1950 Hospital: [facility]   Date: 10/27/2017        IMPRESSION:   ?? Acute/chronic hypoxic/hypercapnic respiratory failure  ?? Community acquired pneumonia  ?? "COPD exacerbation" - she has a diagnosis of COPD, but she has no obstruction on PFTs; patient of Dr. Jayme Cloud  ?? Acute renal failure  ?? Pulmonary HTN - likely due to diastolic heart failure, but can not r/o other causes  ?? LVH/diastolic heart failure  ?? DM/hyperglycemia  ?? Obesity - ?OSA/OHS      PLAN:   ?? On bipap, O2  ?? Bronchodilators  ?? Empiric antibiotics   ?? Follow up cultures  ?? Diurese daily  ?? Will eventually need sleep study   ?? Monitor creatinine, same today  ?? Insulin   ?? DVT prophyaxis  ?? NPO     Subjective/Interval History:   I have reviewed the flowsheet and previous day???s notes.    No acute events overnight  On bipap  No severe distress    Review of Systems   Unable to perform ROS: Acuity of condition     Objective:  Vital Signs:    Visit Vitals  BP 93/54   Pulse 68   Temp 97.9 ??F (36.6 ??C)   Resp 19   Ht  (1.676 m)   Wt 133.1 kg (293 lb 6.9 oz)   SpO2 95%   BMI 47.36 kg/m??       O2 Device: BIPAP   O2 Flow Rate (L/min): 40 l/min   Temp (24hrs), Avg:97.8 ??F (36.6 ??C), Min:97.4 ??F (36.3 ??C), Max:98 ??F (36.7 ??C)     Intake/Output:   Last shift:      No intake/output data recorded.  Last 3 shifts: 05/26 1901 - 05/28 0700  In: 700 [P.O.:550; I.V.:150]  Out: 1155 [Urine:1155]    Intake/Output Summary (Last 24 hours) at 10/27/2017 0714  Last data filed at 10/27/2017 0600  Gross per 24 hour   Intake 700 ml   Output 870 ml   Net -170 ml     Hemodynamics:   PAP:   CO:     Wedge:   CI:     CVP:    SVR:       PVR:       Ventilator Settings:  Mode Rate Tidal Volume Pressure FiO2 PEEP            70 %       Peak airway pressure:      Minute ventilation: 9.4 l/min       Physical Exam    Constitutional: No distress. Face mask in place.   HENT:   Head: Normocephalic and atraumatic.   Eyes: No scleral icterus.   Cardiovascular: Normal rate and regular rhythm.   Pulmonary/Chest: She has no wheezes. She has rales in the right lower field.   Abdominal: Soft. Bowel sounds are normal. She exhibits no distension. There is no tenderness.   Musculoskeletal: She exhibits edema.   Neurological: She is alert.   Skin: Skin is warm and dry.     Data:     Current Facility-Administered Medications   Medication Dose Route Frequency   ??? methylPREDNISolone (PF) (SOLU-MEDROL) injection 20 mg  20 mg IntraVENous DAILY   ??? levoFLOXacin (LEVAQUIN) 750 mg in D5W IVPB  750 mg IntraVENous Q24H   ??? senna (SENOKOT) tablet 8.6 mg  1  Tab Oral DAILY   ??? mupirocin (BACTROBAN) 2 % ointment   Both Nostrils BID   ??? insulin glargine (LANTUS) injection 5 Units  5 Units SubCUTAneous QHS   ??? insulin lispro (HUMALOG) injection   SubCUTAneous AC&HS   ??? sodium chloride (NS) flush 5-40 mL  5-40 mL IntraVENous Q8H   ??? heparin (porcine) injection 7,500 Units  7,500 Units SubCUTAneous Q8H   ??? albuterol-ipratropium (DUO-NEB) 2.5 MG-0.5 MG/3 ML  3 mL Nebulization Q4H RT   ??? aspirin delayed-release tablet 81 mg  81 mg Oral DAILY   ??? pravastatin (PRAVACHOL) tablet 20 mg  20 mg Oral QHS   ??? metoprolol succinate (TOPROL-XL) XL tablet 50 mg  50 mg Oral DAILY                Labs:  Recent Labs     10/26/17  0311 10/25/17  0346   WBC 9.7 10.3   HGB 12.5 12.3   HCT 39.7 38.9   PLT 229 226     Recent Labs     10/26/17  0311 10/25/17  0346   NA 139 140   K 4.9 4.6   CL 105 107   CO2 30 28   GLU 197* 151*   BUN 53* 56*   CREA 1.44* 1.41*   CA 7.9* 7.8*   MG  --  2.3   ALB 3.1* 3.2*   TBILI 1.1* 0.9   SGOT 8* 9*   ALT 19 24     Recent Labs     10/26/17  0636 10/25/17  0601   PHI 7.363 7.293*   PCO2I 51.4* 57.9*   PO2I 65* 72*   HCO3I 29.3* 28.1*   FIO2I 70 70     Imaging:  I have personally reviewed the patient???s radiographs and have reviewed the reports:           Total critical care time exclusive of procedures:   minutes  Vickki Muff, MD

## 2017-10-27 NOTE — Other (Signed)
DTC Progress Note    Recommendations/ Comments: Pt discussed with rounding team and Dr. Jayme Cloud.  Plan to discontinue lantus.  Noted pt refusing correctional insulin.  Continue to enc pt to receive correctional insulin if BG is >180.    Chart reviewed on [name] during Multidisciplinary Rounds.      A1c:   Lab Results   Component Value Date/Time    Hemoglobin A1c 6.4 (H) 10/22/2017 06:05 AM           Recent Glucose Results:   Lab Results   Component Value Date/Time    GLUCPOC 107 (H) 10/27/2017 07:47 AM    GLUCPOC 128 (H) 10/26/2017 09:55 PM    GLUCPOC 147 (H) 10/26/2017 05:23 PM        Lab Results   Component Value Date/Time    Creatinine 1.44 (H) 10/26/2017 03:11 AM     Estimated Creatinine Clearance: 51.7 mL/min (A) (based on SCr of 1.44 mg/dL (H)).    Active Orders   Diet    DIET GI LITE (POST SURGICAL)        PO intake:   Patient Vitals for the past 72 hrs:   % Diet Eaten   10/27/17 0936 100 %   10/26/17 1800 50 %   10/26/17 1300 50 %   10/26/17 0900 50 %   10/25/17 1841 100 %   10/25/17 0942 75 %   10/24/17 1855 100 %   10/24/17 1854 100 %   10/24/17 1330 100 %       Will continue to follow as needed.    Thank you.  Loma Boston, BSN, RN, CDE  [facility]          Time spent: 5 min

## 2017-10-28 ENCOUNTER — Inpatient Hospital Stay: Admit: 2017-10-28 | Payer: MEDICARE | Primary: Internal Medicine

## 2017-10-28 LAB — POC G3 - PUL
Base excess (POC): 4 mmol/L
FIO2 (POC): 100 %
Flow rate (POC): 45 L/min
HCO3 (POC): 28.9 MMOL/L — ABNORMAL HIGH (ref 22–26)
Total resp. rate: 25
pCO2 (POC): 46.8 MMHG — ABNORMAL HIGH (ref 35.0–45.0)
pH (POC): 7.398 (ref 7.35–7.45)
pO2 (POC): 58 MMHG — ABNORMAL LOW (ref 80–100)
sO2 (POC): 89 % — ABNORMAL LOW (ref 92–97)

## 2017-10-28 LAB — METABOLIC PANEL, COMPREHENSIVE
A-G Ratio: 0.9 — ABNORMAL LOW (ref 1.1–2.2)
ALT (SGPT): 13 U/L (ref 12–78)
AST (SGOT): 10 U/L — ABNORMAL LOW (ref 15–37)
Albumin: 2.9 g/dL — ABNORMAL LOW (ref 3.5–5.0)
Alk. phosphatase: 68 U/L (ref 45–117)
Anion gap: 5 mmol/L (ref 5–15)
BUN/Creatinine ratio: 34 — ABNORMAL HIGH (ref 12–20)
BUN: 41 MG/DL — ABNORMAL HIGH (ref 6–20)
Bilirubin, total: 1.4 MG/DL — ABNORMAL HIGH (ref 0.2–1.0)
CO2: 29 mmol/L (ref 21–32)
Calcium: 8.1 MG/DL — ABNORMAL LOW (ref 8.5–10.1)
Chloride: 104 mmol/L (ref 97–108)
Creatinine: 1.21 MG/DL — ABNORMAL HIGH (ref 0.55–1.02)
GFR est AA: 54 mL/min/{1.73_m2} — ABNORMAL LOW (ref >60)
GFR est non-AA: 44 mL/min/{1.73_m2} — ABNORMAL LOW (ref >60)
Globulin: 3.4 g/dL (ref 2.0–4.0)
Glucose: 116 mg/dL — ABNORMAL HIGH (ref 65–100)
Potassium: 4.5 mmol/L (ref 3.5–5.1)
Protein, total: 6.3 g/dL — ABNORMAL LOW (ref 6.4–8.2)
Sodium: 138 mmol/L (ref 136–145)

## 2017-10-28 LAB — GLUCOSE, POC
Glucose (POC): 161 mg/dL — ABNORMAL HIGH (ref 65–100)
Glucose (POC): 97 mg/dL (ref 65–100)
Glucose (POC): 124 mg/dL — ABNORMAL HIGH (ref 65–100)
Glucose (POC): 191 mg/dL — ABNORMAL HIGH (ref 65–100)

## 2017-10-28 LAB — CBC W/O DIFF
ABSOLUTE NRBC: 0 10*3/uL (ref 0.00–0.01)
HCT: 37.8 % (ref 35.0–47.0)
HGB: 12.1 g/dL (ref 11.5–16.0)
MCH: 27.8 PG (ref 26.0–34.0)
MCHC: 32 g/dL (ref 30.0–36.5)
MCV: 86.9 FL (ref 80.0–99.0)
MPV: 12.2 FL (ref 8.9–12.9)
NRBC: 0 PER 100 WBC
PLATELET: 174 10*3/uL (ref 150–400)
RBC: 4.35 M/uL (ref 3.80–5.20)
RDW: 17.2 % — ABNORMAL HIGH (ref 11.5–14.5)
WBC: 10.1 10*3/uL (ref 3.6–11.0)

## 2017-10-28 MED FILL — IPRATROPIUM-ALBUTEROL 2.5 MG-0.5 MG/3 ML NEB SOLUTION: 2.5 mg-0.5 mg/3 ml | RESPIRATORY_TRACT | Qty: 3

## 2017-10-28 MED FILL — MAPAP (ACETAMINOPHEN) 325 MG TABLET: 325 mg | ORAL | Qty: 2

## 2017-10-28 MED FILL — TOPROL XL 25 MG TABLET,EXTENDED RELEASE: 25 mg | ORAL | Qty: 1

## 2017-10-28 MED FILL — ENOXAPARIN 40 MG/0.4 ML SUB-Q SYRINGE: 40 mg/0.4 mL | SUBCUTANEOUS | Qty: 0.4

## 2017-10-28 MED FILL — PREDNISONE 20 MG TAB: 20 mg | ORAL | Qty: 1

## 2017-10-28 MED FILL — BD POSIFLUSH NORMAL SALINE 0.9 % INJECTION SYRINGE: INTRAMUSCULAR | Qty: 10

## 2017-10-28 MED FILL — PRAVASTATIN 10 MG TAB: 10 mg | ORAL | Qty: 2

## 2017-10-28 MED FILL — LEVOFLOXACIN IN D5W 750 MG/150 ML IV PIGGY BACK: 750 mg/150 mL | INTRAVENOUS | Qty: 150

## 2017-10-28 MED FILL — ASPIRIN 81 MG TAB, DELAYED RELEASE: 81 mg | ORAL | Qty: 1

## 2017-10-28 MED FILL — SENNA 8.6 MG TABLET: 8.6 mg | ORAL | Qty: 1

## 2017-10-28 NOTE — Progress Notes (Addendum)
[facility], Critical Care, and Sleep Medicine  Name: Brittney Shelton MRN: [id number]   DOB: 12/14/1950 Hospital: [facility]   Date: 10/28/2017        IMPRESSION:   ?? Acute/chronic hypoxic/hypercapnic respiratory failure; on 4 LPm at home for many yrs since she lived in [place]; now with Dr. Raiford Simmonds; am surprised still needing so much O2 based on XR appearance  ?? Community acquired pneumonia  ?? "COPD exacerbation" - she has a diagnosis of COPD, but she has no obstruction on PFTs; patient of Dr. Jayme Cloud  ?? Acute renal failure  ?? Severe Pulmonary HTN with dilated RV and RV failure on echo; CTA negative for PE  ?? LVH/diastolic heart failure  ?? DM/hyperglycemia  ?? Obesity - ?OSA/OHS      PLAN:   ?? On bipap or HFNC-   ?? If she declines, will need intubation  ?? Incentive spirometer- told pt may take awhile for her to recover and can make no predictions how fast  ?? Bronchodilators   ?? Empiric antibiotics to cpmplete a course  ?? Follow up cultures  ?? Diurese daily  ?? Will eventually need sleep study   ?? Monitor creatinine, same today  ?? Insulin   ?? DVT prophyaxis  ?? Slowly advance diet     Subjective/Interval History:   I have reviewed the flowsheet and previous day???s notes.    5/29 BIPAP then HFNC this AM. sats hovering close to 88%- 90%. No congestion. No fver. I > O since admission. No preipheral edema. CXr reviewed- no effusion    5/28 No acute events overnight.On bipap.   No severe distress    Review of Systems   Unable to perform ROS: Acuity of condition     Objective:  Vital Signs:    Visit Vitals  BP 112/58   Pulse 81   Temp 98.1 ??F (36.7 ??C)   Resp 26   Ht  (1.676 m)   Wt 130 kg (286 lb 9.6 oz)   SpO2 (!) 88%   BMI 46.26 kg/m??       O2 Device: Heated, Hi flow nasal cannula   O2 Flow Rate (L/min): 45 l/min   Temp (24hrs), Avg:98.1 ??F (36.7 ??C), Min:97.7 ??F (36.5 ??C), Max:98.8 ??F (37.1 ??C)     Intake/Output:   Last shift:      No intake/output data recorded.   Last 3 shifts: 05/27 1901 - 05/29 0700  In: 1450 [P.O.:1300; I.V.:150]  Out: 1550 [Urine:1550]    Intake/Output Summary (Last 24 hours) at 10/28/2017 0803  Last data filed at 10/28/2017 0600  Gross per 24 hour   Intake 1350 ml   Output 1025 ml   Net 325 ml     Hemodynamics:   PAP:   CO:     Wedge:   CI:     CVP:    SVR:       PVR:       Ventilator Settings:  Mode Rate Tidal Volume Pressure FiO2 PEEP            100 %       Peak airway pressure:      Minute ventilation: 14.5 l/min       Physical Exam   Constitutional: No distress. Nasal cannula in place.   HFNC   HENT:   Head: Normocephalic and atraumatic.   Eyes: No scleral icterus.   Cardiovascular: Normal rate and regular rhythm.   Pulmonary/Chest: She  has no wheezes. She has rales in the right lower field.   Abdominal: Soft. Bowel sounds are normal. She exhibits no distension. There is no tenderness.   Musculoskeletal: She exhibits edema.   Neurological: She is alert.   Skin: Skin is warm and dry. She is not diaphoretic. Nails show no clubbing.     Data:     Current Facility-Administered Medications   Medication Dose Route Frequency   ??? predniSONE (DELTASONE) tablet 20 mg  20 mg Oral DAILY WITH BREAKFAST   ??? enoxaparin (LOVENOX) injection 40 mg  40 mg SubCUTAneous Q12H   ??? metoprolol succinate (TOPROL-XL) XL tablet 25 mg  25 mg Oral DAILY   ??? levoFLOXacin (LEVAQUIN) 750 mg in D5W IVPB  750 mg IntraVENous Q24H   ??? senna (SENOKOT) tablet 8.6 mg  1 Tab Oral DAILY   ??? mupirocin (BACTROBAN) 2 % ointment   Both Nostrils BID   ??? insulin lispro (HUMALOG) injection   SubCUTAneous AC&HS   ??? sodium chloride (NS) flush 5-40 mL  5-40 mL IntraVENous Q8H   ??? albuterol-ipratropium (DUO-NEB) 2.5 MG-0.5 MG/3 ML  3 mL Nebulization Q4H RT   ??? aspirin delayed-release tablet 81 mg  81 mg Oral DAILY   ??? pravastatin (PRAVACHOL) tablet 20 mg  20 mg Oral QHS                Labs:  Recent Labs     10/28/17  0347 10/26/17  0311   WBC 10.1 9.7   HGB 12.1 12.5   HCT 37.8 39.7   PLT 174 229      Recent Labs     10/28/17  0347 10/26/17  0311   NA 138 139   K 4.5 4.9   CL 104 105   CO2 29 30   GLU 116* 197*   BUN 41* 53*   CREA 1.21* 1.44*   CA 8.1* 7.9*   ALB 2.9* 3.1*   TBILI 1.4* 1.1*   SGOT 10* 8*   ALT 13 19     Recent Labs     10/28/17  0600 10/26/17  0636   PHI 7.398 7.363   PCO2I 46.8* 51.4*   PO2I 58* 65*   HCO3I 28.9* 29.3*   FIO2I 100 70     Imaging:  I have personally reviewed the patient???s radiographs and have reviewed the reports:          Total critical care time exclusive of procedures:   minutes  Thayer Headings, MD

## 2017-10-28 NOTE — Progress Notes (Signed)
TOC PLAN:  DC home with HH Nursing/PT/OT/aide when medically stable pending titration of existing home 02.  HF Bundle  HUG program  ACO Member    Pt with hx of COPD, CHF admitted with respiratory failure secondary to PNA, severe pulmonary HTN. Has been on BIPAP - now using hi flow 02 at 45 lpm.    PT/OT were able to do evaluations on 5/28 and recommended HH at dc. Pt already given FOC and selected [facility] - referral sent to same although awaiting HH orders when pt is closer to medical stability.    Pt lives w/ her brother and has been modified I for ADLs with RW - CM will discuss adding temporary HH aide at dc and possible Medicaid Personal Care Aide if pt is agreeable. If so, CM will complete UAI.    Intensivists in CCU recommending sleep study as Outpt after dc - [facility] is [facility].    Vernice Jefferson, MSW  [phone]

## 2017-10-28 NOTE — Progress Notes (Signed)
Hospitalist Progress Note  NAME: Brittney Shelton   DOB:  10-12-1950   MRN:  [id number]       Assessment / Plan:  Acute on chronic respiratory failure with hypoxia POA  Acute on chronic diastolic CHF POA, Cor pulmonale POA  Right lower lobe pneumonia  COPD exacerbation POA  -clinically with very minimal improvement; on high O2 flow and bipap prn   Baseline 4L oxygen requirement  -On Levaquin for pneumonia   -IV steroids and nebs -- weaning steroids per pulmonology  -diuresing   Continue support with high-flow nasal cannula, BiPAP PRN  ??  Acute kidney injury POA  -last normal Cr 09/2017, admission 1.6 --> stable at 1.4 now   Good UO   Unclear exact etiology  -monitor closely. Avoid nephrotoxic drugs, adjust all meds to GFR.   ??  Essential HTN POA  -BP mostly at 110s -->  holding Norvasc  ??  DM type 2 POA  Diet controlled at home. BS acceptable now   Last HgBa1c 6.4 in Dec 2018  Hyperglycemic on steroids; refusing insulin, unfortunately   ??  Right pleural effusion POA  Small, doubt thoracentesis would contribute much  ??  Hyperkalemia, mild, resolved   Remote tobacco use quit 20 years ago  40 or above Morbid obesity / Body mass index is 46.26 kg/m??.    Code status: Full  Prophylaxis: Hep SQ     Recommended Disposition: remain in ICU      Subjective:     Chief Complaint / Reason for Physician Visit: follow up respiratory failure/ copd   Pt was OOB to chair  Very minimal progress since admission     Discussed with RN events overnight.     Review of Systems:  Symptom Y/N Comments  Symptom Y/N Comments   Fever/Chills n   Chest Pain n    Poor Appetite    Edema     Cough    Abdominal Pain n    Sputum    Joint Pain     SOB/DOE    Pruritis/Rash     Nausea/vomit n   Tolerating PT/OT     Diarrhea n   Tolerating Diet     Constipation n   Other       Could NOT obtain due to:      Objective:     VITALS:   Last 24hrs VS reviewed since prior progress note. Most recent are:  Patient Vitals for the past 24 hrs:   Temp Pulse Resp BP SpO2    10/28/17 1800 ??? 86 25 126/77 93 %   10/28/17 1600 ??? 92 25 102/58 95 %   10/28/17 1542 98.6 ??F (37 ??C) ??? ??? ??? ???   10/28/17 1524 ??? 91 21 ??? 91 %   10/28/17 1523 ??? ??? ??? ??? 91 %   10/28/17 1500 ??? 92 25 (!) 115/91 91 %   10/28/17 1400 ??? 87 23 123/75 (!) 85 %   10/28/17 1300 ??? 88 25 133/75 92 %   10/28/17 1200 ??? 87 26 125/79 (!) 89 %   10/28/17 1158 98.8 ??F (37.1 ??C) ??? ??? ??? ???   10/28/17 1132 ??? ??? ??? ??? 91 %   10/28/17 1100 ??? 83 25 121/76 (!) 88 %   10/28/17 1000 ??? 85 24 126/64 (!) 86 %   10/28/17 0900 ??? 87 21 111/61 93 %   10/28/17 0847 ??? ??? ??? ??? 92 %   10/28/17 0800 98.6 ??F (  37 ??C) 82 22 113/54 (!) 89 %   10/28/17 0700 ??? 81 26 112/58 (!) 88 %   10/28/17 0600 ??? 83 16 106/52 92 %   10/28/17 0523 ??? ??? ??? ??? 96 %   10/28/17 0500 ??? 78 23 95/55 95 %   10/28/17 0400 98.1 ??F (36.7 ??C) 80 23 96/58 91 %   10/28/17 0300 ??? 80 16 99/59 91 %   10/28/17 0200 ??? 81 15 108/61 92 %   10/28/17 0100 ??? 76 21 106/66 94 %   10/28/17 0000 97.8 ??F (36.6 ??C) 81 18 109/64 (!) 88 %   10/27/17 2348 ??? ??? ??? ??? 91 %   10/27/17 2300 ??? 78 20 119/58 (!) 88 %   10/27/17 2200 ??? 81 21 120/55 90 %   10/27/17 2100 ??? 81 21 135/86 95 %   10/27/17 2000 98.8 ??F (37.1 ??C) 83 23 120/69 93 %   10/27/17 1905 ??? ??? ??? ??? 95 %   10/27/17 1900 ??? 79 18 116/58 96 %       Intake/Output Summary (Last 24 hours) at 10/28/2017 1831  Last data filed at 10/28/2017 1100  Gross per 24 hour   Intake 240 ml   Output 725 ml   Net -485 ml        PHYSICAL EXAM:  General: WD, WN. Alert, cooperative, no acute distress, morbidly obese  EENT:  EOMI. Anicteric sclerae. MMM  Resp:  Exam limited by habitus, no wheezing or rales  CV:  Regular  Rhythm,??+ edema  GI:  Soft, Non distended, Non tender. ??+Bowel sounds  Neurologic:?? Alert and oriented X 3, normal speech,   Psych:???? Fair insight.??Not anxious nor agitated    Reviewed most current lab test results and cultures  YES  Reviewed most current radiology test results   YES  Review and summation of old records today    NO   Reviewed patient's current orders and MAR    YES  PMH/SH reviewed - no change compared to H&P  ________________________________________________________________________  Care Plan discussed with:    Comments   Patient y    Family      RN y    Buyer, retail  y Pulmonology                      Multidiciplinary team rounds were held today with case manager, nursing, pharmacist and Higher education careers adviser.  Patient's plan of care was discussed; medications were reviewed and discharge planning was addressed.     ________________________________________________________________________  Total NON critical care TIME:  20   Minutes    Total CRITICAL CARE TIME Spent:   Minutes non procedure based      Comments   >50% of visit spent in counseling and coordination of care     ________________________________________________________________________  Guerry Bruin, MD     Procedures: see electronic medical records for all procedures/Xrays and details which were not copied into this note but were reviewed prior to creation of Plan.      LABS:  I reviewed today's most current labs and imaging studies.  Pertinent labs include:  Recent Labs     10/28/17  0347 10/26/17  0311   WBC 10.1 9.7   HGB 12.1 12.5   HCT 37.8 39.7   PLT 174 229     Recent Labs     10/28/17  0347 10/26/17  0311   NA 138 139  K 4.5 4.9   CL 104 105   CO2 29 30   GLU 116* 197*   BUN 41* 53*   CREA 1.21* 1.44*   CA 8.1* 7.9*   ALB 2.9* 3.1*   TBILI 1.4* 1.1*   SGOT 10* 8*   ALT 13 19       Signed: Guerry Bruin, MD

## 2017-10-28 NOTE — Other (Signed)
Interdisciplinary team rounds were held 10/28/2017 with the following team members:Care Management, Diabetes Treatment Specialist, Nursing, Nutrition, Pharmacy, Physical Therapy, Physician and Respiratory Therapy.    Plan of care discussed. See clinical pathway and/or care plan for interventions and desired outcomes.

## 2017-10-28 NOTE — Progress Notes (Addendum)
0700: Report received from [place], Charity fundraiser. Patient resting quietly in bed on hiflow NC. Sats 86%.     0800: Assessment completed and patient repositioned in bed. Requesting to sit up in chair.     0845: Patient up to chair x1 assist with wheeled walker    1000: OT at bedside    1200: Reassessment completed with no changes.     1300: Foley discontinued per nurse removal protocol.     1500: PT at bedside    1900: Report given to Erie Noe, [place]

## 2017-10-28 NOTE — Progress Notes (Signed)
Problem: Mobility Impaired (Adult and Pediatric)  Goal: *Acute Goals and Plan of Care (Insert Text)  Description  Physical Therapy Goals  Initiated 10/27/2017  1.  Patient will move from supine to sit and sit to supine  in bed with modified independence within 7 day(s).    2.  Patient will transfer from bed to chair and chair to bed with modified independence using the least restrictive device within 7 day(s).  3.  Patient will perform sit to stand with modified independence within 7 day(s).  4.  Patient will ambulate with modified independence for 50 feet with the least restrictive device within 7 day(s).   5.  Patient will ascend/descend 4 stairs with bilateral handrail(s) with supervision/set-up within 7 day(s).     Outcome: Progressing Towards Goal   PHYSICAL THERAPY TREATMENT  Patient: Brittney Shelton [phone] y.o. female)  Date: 10/28/2017  Diagnosis: Acute respiratory failure with hypoxemia (HCC) [J96.01] <principal problem not specified>       Precautions: Fall  Chart, physical therapy assessment, plan of care and goals were reviewed.    ASSESSMENT:  Pt received seated in bedside chair on 45LPM hi flow NC, agreeable to participation with therapy. Pt continues to present with impaired functional mobility secondary to impaired activity tolerance/endurance and generalized weakness. Participation with therapy and progression of functional mobility continues to be limited by respiratory status, with pt requiring multiple, prolonged seated rest breaks b/t activity. O2 sats 94% at seated rest however decreased with 86% with activity, despite hi flow NC. Overall, pt tolerated therapy session well and is making slow, steady gains.    Progression toward goals:  ?    Improving appropriately and progressing toward goals  ?    Improving slowly and progressing toward goals  ?    Not making progress toward goals and plan of care will be adjusted     PLAN:  Patient continues to benefit from skilled intervention to address the  above impairments.  Continue treatment per established plan of care.  Discharge Recommendations:  Home Health  Further Equipment Recommendations for Discharge:  Owns necessary equipment      SUBJECTIVE:   Patient stated ?I'll do whatever you say.?    OBJECTIVE DATA SUMMARY:   Critical Behavior:  Neurologic State: Alert  Orientation Level: Oriented X4  Cognition: Appropriate decision making     Functional Mobility Training:  Bed Mobility:                    Transfers:  Sit to Stand: Stand-by assistance  Stand to Sit: Stand-by assistance                             Balance:  Sitting: Intact  Standing: Impaired;With support  Standing - Static: Good;Constant support  Standing - Dynamic : Fair  Ambulation/Gait Training:                                                    Therapeutic Exercises:   2x 10 standing marches w/ RW support  4x sit<>stand  2x 10 LAQ  Pain:  Pain Scale 1: Numeric (0 - 10)  Pain Intensity 1: 0              Activity Tolerance:   O2 sats 86% with activity  on 45LPM HiFlow NC  Please refer to the flowsheet for vital signs taken during this treatment.  After treatment:   ?    Patient left in no apparent distress sitting up in chair  ?    Patient left in no apparent distress in bed  ?    Call bell left within reach  ?    Nursing notified  ?    Caregiver present  ?    Bed alarm activated    COMMUNICATION/COLLABORATION:   The patient?s plan of care was discussed with: Registered Nurse    Demetrio Lapping, PT, DPT   Time Calculation: 15 mins

## 2017-10-28 NOTE — Progress Notes (Signed)
Problem: Self Care Deficits Care Plan (Adult)  Goal: *Acute Goals and Plan of Care (Insert Text)  Description  Occupational Therapy Goals  Initiated 10/27/2017  1.  Patient will perform grooming tasks standing at the sink with modified independence within 7 day(s).  2.  Patient will perform lower body dressing with modified independence within 7 day(s).  3.  Patient will perform toilet transfers with modified independence using least restrictive device within 7 day(s).  4.  Patient will perform all aspects of toileting with modified independence within 7 day(s).  5.  Patient will perform safe item retrieval in preparation for ADL/self-care tasks with Mod I (while managing O2 extension cord in small functional spaces) and using least restrictive device within 7 day(s).   6.  Patient will utilize activity pacing and energy conservation techniques during functional activities without verbal cues within 7 day(s).          Outcome: Progressing Towards Goal   OCCUPATIONAL THERAPY TREATMENT  Patient: Brittney Shelton [phone] y.o. female)  Date: 10/28/2017  Diagnosis: Acute respiratory failure with hypoxemia (HCC) [J96.01] <principal problem not specified>       Precautions: Fall  Chart, occupational therapy assessment, plan of care, and goals were reviewed.    ASSESSMENT:  Received sitting in chair on arrival, on 45 L/min O2 via NC. Pt progressing to SBA with chair<>BSC this session. Pt donned fresh hospital gown with Min A requiring assist with neck tie only d/t decreased shoulder external rotation at baseline. She brushed her teeth with Setup Assist while seated. Plan to challenge Pts standing tolerance next session by performing grooming tasks standing at elevated tray table, given limited mobility in her room at this time 2/2 Hi Flow Nasal Cannula machine lines. Pt was able to reach bilateral heels to doff shoes by flexing forward, yet was unable to maintain position for more than 2-3 seconds while seated w/o  becoming fatigued. Educated Pt on benefits to considering shoe horn and sock aide if/when she needs to wear socks as she reports not wearing them because she hasn't been able to put them on. Will demo/trial long handled bathing/dressing equipment next session.     Progression toward goals:  ?       Improving appropriately and progressing toward goals  ?       Improving slowly and progressing toward goals  ?       Not making progress toward goals and plan of care will be adjusted     PLAN:  Patient continues to benefit from skilled intervention to address the above impairments.  Continue treatment per established plan of care.  Discharge Recommendations:  Home Health  Further Equipment Recommendations for Discharge:  Pt may benefit from Reacher; Shoe horn, Sock Aide; Long handled bathing equipment to maximize her independence with LB ADLs     SUBJECTIVE:   Patient stated ?I just take my time"    OBJECTIVE DATA SUMMARY:   Cognitive/Behavioral Status:  Neurologic State: Alert  Orientation Level: Oriented X4  Cognition: Appropriate decision making       Functional Mobility and Transfers for ADLs:  Transfers:  Sit to Stand: Stand-by assistance  Functional Transfers  Bathroom Mobility: Stand-by assistance  Toilet Transfer : Stand-by assistance       Balance:  Sitting: Intact  Standing: Impaired;With support  Standing - Static: Good;Constant support  Standing - Dynamic : Fair  ADL Intervention:     Grooming  Brushing Teeth: Set-up (seated using tray table)  Upper Body Dressing Assistance  Hospital Gown: Minimum  assistance(with tie at neck only)    Lower Body Dressing Assistance  Shoes with Elastic Laces: Set-up    Activity Tolerance:   BP remained stable with transfers this session. O2 sats decreased to upper 70s on 45 L/min Hi Flow Nasal Cannula this session during toilet transfer, yet recovered to 89% with seated RB.    Please refer to the flowsheet for vital signs taken during this treatment.  After treatment:    ? Patient left in no apparent distress sitting up in chair  ? Patient left in no apparent distress in bed  ? Call bell left within reach  ? Nursing notified  ? Caregiver present  ? Bed alarm activated    COMMUNICATION/COLLABORATION:   The patient?s plan of care was discussed with: Physical Therapist, Registered Nurse and Patient     Benay Pillow, OTR/L  Time Calculation: 29 mins

## 2017-10-29 ENCOUNTER — Encounter: Attending: Internal Medicine | Primary: Internal Medicine

## 2017-10-29 LAB — GLUCOSE, POC
Glucose (POC): 140 mg/dL — ABNORMAL HIGH (ref 65–100)
Glucose (POC): 188 mg/dL — ABNORMAL HIGH (ref 65–100)

## 2017-10-29 MED FILL — IPRATROPIUM-ALBUTEROL 2.5 MG-0.5 MG/3 ML NEB SOLUTION: 2.5 mg-0.5 mg/3 ml | RESPIRATORY_TRACT | Qty: 3

## 2017-10-29 MED FILL — PRAVASTATIN 10 MG TAB: 10 mg | ORAL | Qty: 2

## 2017-10-29 MED FILL — ENOXAPARIN 40 MG/0.4 ML SUB-Q SYRINGE: 40 mg/0.4 mL | SUBCUTANEOUS | Qty: 0.4

## 2017-10-29 MED FILL — PREDNISONE 20 MG TAB: 20 mg | ORAL | Qty: 1

## 2017-10-29 MED FILL — TOPROL XL 25 MG TABLET,EXTENDED RELEASE: 25 mg | ORAL | Qty: 1

## 2017-10-29 MED FILL — ASPIRIN 81 MG TAB, DELAYED RELEASE: 81 mg | ORAL | Qty: 1

## 2017-10-29 MED FILL — BD POSIFLUSH NORMAL SALINE 0.9 % INJECTION SYRINGE: INTRAMUSCULAR | Qty: 20

## 2017-10-29 MED FILL — MAPAP (ACETAMINOPHEN) 325 MG TABLET: 325 mg | ORAL | Qty: 2

## 2017-10-29 MED FILL — SENNA 8.6 MG TABLET: 8.6 mg | ORAL | Qty: 1

## 2017-10-29 NOTE — Progress Notes (Signed)
Pt states she feels like she has to urinate, placed on bedpan multiple times throughout shift with no results. Bladder scan shows . Will continue to monitor and I/O with >440mL.

## 2017-10-29 NOTE — Progress Notes (Signed)
[facility], Critical Care, and Sleep Medicine  Name: Brittney Shelton MRN: [id number]   DOB: March 20, 1951 Hospital: [facility]   Date: 10/29/2017        IMPRESSION:   ?? Acute/chronic hypoxic/hypercapnic respiratory failure; on 4 LPm at home for many yrs since she lived in [place]; now with Dr. Raiford Simmonds; am surprised still needing so much O2 based on XR appearance  ?? Community acquired pneumonia  ?? "COPD exacerbation" - she has a diagnosis of COPD, but she has no obstruction on PFTs; patient of Dr. Jayme Cloud  ?? Acute renal failure  ?? Severe Pulmonary HTN with dilated RV and RV failure on echo; CTA negative for PE  ?? LVH/diastolic heart failure  ?? DM/hyperglycemia  ?? Obesity - ?OSA/OHS      PLAN:   ?? On bipap or HFNC-   ?? Eventually needs RHC, vasodilator trail but not sure if she will ever become stable enough  ?? If she declines, will need intubation  ?? Incentive spirometer- told pt may take awhile for her to recover and can make no predictions how fast  ?? Bronchodilators   ?? Empiric antibiotics to cpmplete a course  ?? Follow up cultures  ?? Diurese daily  ?? Will eventually need sleep study   ?? Monitor creatinine, same today  ?? Insulin   ?? DVT prophyaxis  ?? Slowly advance diet     Subjective/Interval History:   I have reviewed the flowsheet and previous day???s notes.    5/30 HFNC 45 LPM. OOB the other day. Slept without BIPAP. ROS otherwise negative    5/29 BIPAP then HFNC this AM. sats hovering close to 88%- 90%. No congestion. No fver. I > O since admission. No preipheral edema. CXr reviewed- no effusion    5/28 No acute events overnight.On bipap.   No severe distress    Objective:  Vital Signs:    Visit Vitals  BP 116/54 (BP 1 Location: Left arm, BP Patient Position: At rest)   Pulse 77   Temp 97.9 ??F (36.6 ??C)   Resp 20   Ht  (1.676 m)   Wt 132.5 kg (292 lb 1.8 oz)   SpO2 91%   BMI 47.15 kg/m??       O2 Device: Heated, Hi flow nasal cannula    O2 Flow Rate (L/min): 45 l/min   Temp (24hrs), Avg:98.4 ??F (36.9 ??C), Min:97.9 ??F (36.6 ??C), Max:98.8 ??F (37.1 ??C)     Intake/Output:   Last shift:      No intake/output data recorded.  Last 3 shifts: 05/28 1901 - 05/30 0700  In: 240 [P.O.:240]  Out: 1125 [Urine:1125]    Intake/Output Summary (Last 24 hours) at 10/29/2017 0852  Last data filed at 10/29/2017 0624  Gross per 24 hour   Intake ???   Output 550 ml   Net -550 ml     Hemodynamics:   PAP:   CO:     Wedge:   CI:     CVP:    SVR:       PVR:       Ventilator Settings:  Mode Rate Tidal Volume Pressure FiO2 PEEP            100 %       Peak airway pressure:      Minute ventilation: 14.5 l/min       Physical Exam   Constitutional: No distress. Nasal cannula in place.   HFNC  HENT:   Head: Normocephalic and atraumatic.   Eyes: No scleral icterus.   Cardiovascular: Normal rate and regular rhythm.   Pulmonary/Chest: She has no wheezes. She has rales in the right lower field.   Abdominal: Soft. Bowel sounds are normal. She exhibits no distension. There is no tenderness.   Musculoskeletal: She exhibits edema.   Neurological: She is alert.   Skin: Skin is warm and dry. She is not diaphoretic. Nails show no clubbing.     Data:     Current Facility-Administered Medications   Medication Dose Route Frequency   ??? predniSONE (DELTASONE) tablet 20 mg  20 mg Oral DAILY WITH BREAKFAST   ??? enoxaparin (LOVENOX) injection 40 mg  40 mg SubCUTAneous Q12H   ??? metoprolol succinate (TOPROL-XL) XL tablet 25 mg  25 mg Oral DAILY   ??? senna (SENOKOT) tablet 8.6 mg  1 Tab Oral DAILY   ??? mupirocin (BACTROBAN) 2 % ointment   Both Nostrils BID   ??? insulin lispro (HUMALOG) injection   SubCUTAneous AC&HS   ??? sodium chloride (NS) flush 5-40 mL  5-40 mL IntraVENous Q8H   ??? albuterol-ipratropium (DUO-NEB) 2.5 MG-0.5 MG/3 ML  3 mL Nebulization Q4H RT   ??? aspirin delayed-release tablet 81 mg  81 mg Oral DAILY   ??? pravastatin (PRAVACHOL) tablet 20 mg  20 mg Oral QHS                Labs:  Recent Labs      10/28/17  0347   WBC 10.1   HGB 12.1   HCT 37.8   PLT 174     Recent Labs     10/28/17  0347   NA 138   K 4.5   CL 104   CO2 29   GLU 116*   BUN 41*   CREA 1.21*   CA 8.1*   ALB 2.9*   TBILI 1.4*   SGOT 10*   ALT 13     Recent Labs     10/28/17  0600   PHI 7.398   PCO2I 46.8*   PO2I 58*   HCO3I 28.9*   FIO2I 100     Imaging:  I have personally reviewed the patient???s radiographs and have reviewed the reports:          Total critical care time exclusive of procedures:   minutes  Thayer Headings, MD

## 2017-10-29 NOTE — Progress Notes (Signed)
Hospitalist Progress Note  NAME: Brittney Shelton   DOB:  12-03-1950   MRN:  [id number]       Assessment / Plan:  Acute on chronic respiratory failure with hypoxia POA  Acute on chronic diastolic CHF POA, Cor pulmonale POA  Right lower lobe pneumonia  COPD exacerbation POA  -clinically with very minimal improvement; on high O2 flow and bipap prn   Baseline 4L oxygen requirement  -pulmonary help was greatly appreciated   -completed Levaquin   -IV steroids --> PO prednisone, tapering  Jet nebs scheduled   -s/p diuresing   ??  Acute kidney injury POA  -last normal Cr 09/2017, admission 1.6 --> stable at 1.4 now   Good UO   Unclear exact etiology  -monitor closely. Avoid nephrotoxic drugs, adjust all meds to GFR.   ??  Essential HTN POA  -BP mostly at 110s -->  holding Norvasc  ??  DM type 2 POA  Diet controlled at home. BS acceptable now   Last HgBa1c 6.4 in Dec 2018  Hyperglycemic on steroids; refusing insulin, unfortunately   ??  Right pleural effusion POA  Small, doubt thoracentesis would contribute much  ??  Hyperkalemia, mild, resolved   Remote tobacco use quit 20 years ago  40 or above Morbid obesity / Body mass index is 47.15 kg/m??.    Code status: Full  Prophylaxis: Hep SQ     Recommended Disposition: remain in ICU      Subjective:     Chief Complaint / Reason for Physician Visit: follow up respiratory failure/ copd   HFNC 45 LPM. Slept without BIPAP    Discussed with RN events overnight.     Review of Systems:  Symptom Y/N Comments  Symptom Y/N Comments   Fever/Chills n   Chest Pain n    Poor Appetite    Edema     Cough    Abdominal Pain n    Sputum    Joint Pain     SOB/DOE    Pruritis/Rash     Nausea/vomit n   Tolerating PT/OT     Diarrhea n   Tolerating Diet     Constipation n   Other       Could NOT obtain due to:      Objective:     VITALS:   Last 24hrs VS reviewed since prior progress note. Most recent are:  Patient Vitals for the past 24 hrs:   Temp Pulse Resp BP SpO2    10/29/17 1600 98.2 ??F (36.8 ??C) 93 27 105/66 93 %   10/29/17 1535 ??? ??? ??? ??? 91 %   10/29/17 1500 ??? 91 28 109/73 92 %   10/29/17 1400 ??? 89 24 100/57 91 %   10/29/17 1300 ??? 95 27 105/68 91 %   10/29/17 1240 ??? ??? ??? ??? 90 %   10/29/17 1200 ??? 91 24 90/53 (!) 85 %   10/29/17 1100 ??? 88 26 105/78 (!) 89 %   10/29/17 1000 ??? 91 23 94/52 91 %   10/29/17 0936 ??? 96 24 108/74 (!) 89 %   10/29/17 0849 ??? ??? ??? ??? 91 %   10/29/17 0800 ??? 84 20 98/58 91 %   10/29/17 0700 ??? 77 20 116/54 93 %   10/29/17 0600 ??? 81 22 104/50 92 %   10/29/17 0500 ??? 82 20 104/81 90 %   10/29/17 0400 ??? 92 26 114/63 (!) 87 %   10/29/17 0343 ??? ??? ??? ???  90 %   10/29/17 0300 ??? 78 12 102/47 95 %   10/29/17 0200 ??? 89 22 116/67 (!) 83 %   10/29/17 0100 ??? 82 20 101/57 92 %   10/29/17 0000 97.9 ??F (36.6 ??C) 80 19 111/63 (!) 89 %   10/28/17 2337 ??? ??? ??? ??? 91 %   10/28/17 2300 ??? 82 19 102/51 90 %   10/28/17 2228 ??? 90 23 (!) 101/15 (!) 88 %   10/28/17 2200 ??? 91 22 ??? (!) 86 %   10/28/17 2100 ??? 87 23 ??? 92 %   10/28/17 2000 ??? 86 22 ??? 95 %   10/28/17 1929 ??? ??? ??? ??? 96 %   10/28/17 1800 ??? 86 25 126/77 93 %       Intake/Output Summary (Last 24 hours) at 10/29/2017 1737  Last data filed at 10/29/2017 0624  Gross per 24 hour   Intake ???   Output 400 ml   Net -400 ml        PHYSICAL EXAM:  General: WD, WN. Alert, cooperative, no acute distress, morbidly obese  EENT:  EOMI. Anicteric sclerae. MMM  Resp:  Exam limited by habitus, no wheezing or rales  CV:  Regular  Rhythm,??+ edema  GI:  Soft, Non distended, Non tender. ??+Bowel sounds  Neurologic:?? Alert and oriented X 3, normal speech,   Psych:???? Fair insight.??Not anxious nor agitated    Reviewed most current lab test results and cultures  YES  Reviewed most current radiology test results   YES  Review and summation of old records today    NO  Reviewed patient's current orders and MAR    YES  PMH/SH reviewed - no change compared to H&P  ________________________________________________________________________  Care Plan discussed with:     Comments   Patient y    Family      RN y    Buyer, retail                        Multidiciplinary team rounds were held today with case manager, nursing, pharmacist and Higher education careers adviser.  Patient's plan of care was discussed; medications were reviewed and discharge planning was addressed.     ________________________________________________________________________  Total NON critical care TIME:  20   Minutes    Total CRITICAL CARE TIME Spent:   Minutes non procedure based      Comments   >50% of visit spent in counseling and coordination of care     ________________________________________________________________________  Guerry Bruin, MD     Procedures: see electronic medical records for all procedures/Xrays and details which were not copied into this note but were reviewed prior to creation of Plan.      LABS:  I reviewed today's most current labs and imaging studies.  Pertinent labs include:  Recent Labs     10/28/17  0347   WBC 10.1   HGB 12.1   HCT 37.8   PLT 174     Recent Labs     10/28/17  0347   NA 138   K 4.5   CL 104   CO2 29   GLU 116*   BUN 41*   CREA 1.21*   CA 8.1*   ALB 2.9*   TBILI 1.4*   SGOT 10*   ALT 13       Signed: Guerry Bruin, MD

## 2017-10-29 NOTE — Progress Notes (Signed)
Problem: Self Care Deficits Care Plan (Adult)  Goal: *Acute Goals and Plan of Care (Insert Text)  Description  Occupational Therapy Goals  Initiated 10/27/2017  1.  Patient will perform grooming tasks standing at the sink with modified independence within 7 day(s).  2.  Patient will perform lower body dressing with modified independence within 7 day(s).  3.  Patient will perform toilet transfers with modified independence using least restrictive device within 7 day(s).  4.  Patient will perform all aspects of toileting with modified independence within 7 day(s).  5.  Patient will perform safe item retrieval in preparation for ADL/self-care tasks with Mod I (while managing O2 extension cord in small functional spaces) and using least restrictive device within 7 day(s).   6.  Patient will utilize activity pacing and energy conservation techniques during functional activities without verbal cues within 7 day(s).          Outcome: Progressing Towards Goal   OCCUPATIONAL THERAPY TREATMENT  Patient: Brittney Shelton [phone] y.o. female)  Date: 10/29/2017  Diagnosis: Acute respiratory failure with hypoxemia (HCC) [J96.01] <principal problem not specified>       Precautions: Fall  Chart, occupational therapy assessment, plan of care, and goals were reviewed.    ASSESSMENT:  Pt cleared for therapy and agreeable to participate. Received seated in chair on arrival. BP remained stable with positional changes. O2 sats decreased to 81% on 45 L/min HFNC during UB/LB bathing while seated in chair this session. Demonstrates apathy regarding current medical condition and was very flat this session. Per RN, Pt is feeling down about missing a family reunion this weekend. O2 sats decreased to upper 80s/low 90s with x2 Rbs. Pt was able to brush her teeth with Supervision standing at elevated tray table with 02 sats remaining in mid 80s. She performed UB bathing with Min A for wiping back area and LB bathing with Min A for  cleaning feet/ankles d/t decreased trunk flexion. Challenged Pt to complete perineal bathing in standing this session, requiring CGA. Overall Pt is able to perform seated/standing ADLs close to baseline, yet unable to assess more dynamic aspects of Pts self-care routine and endurance 2/2 to connection to HFNC machine. Pt returned seated in chair at close of session with instruction to request assist from nursing staff to complete ADLs in standing as much as she can tolerate throughout the day to continue facilitating her functional activity tolerance.     Progression toward goals:  ?       Improving appropriately and progressing toward goals  ?       Improving slowly and progressing toward goals  ?       Not making progress toward goals and plan of care will be adjusted     PLAN:  Patient continues to benefit from skilled intervention to address the above impairments.  Continue treatment per established plan of care.  Discharge Recommendations:  Home Health OT  Further Equipment Recommendations for Discharge:  Reacher, Shoe horn, Sock Aide     SUBJECTIVE:   Patient stated ?I don't care, I'll do whatever you need me to"    OBJECTIVE DATA SUMMARY:   Cognitive/Behavioral Status:     Functional Mobility and Transfers for ADLs:  Bed Mobility:  Scooting: Modified independent    Transfers:  Sit to Stand: Stand-by assistance  Functional Transfers  Toilet Transfer : Supervision;Stand-by assistance       Balance:  Sitting: Intact  Standing: Impaired;Without support  Standing - Static: Good;Constant support  Standing - Dynamic : Good;Constant support  ADL Intervention:  Grooming  Brushing Teeth: Supervision(standing at elevated tray table)    Upper Body Bathing  Bathing Assistance: Minimum assistance(with wiping back area)  Position Performed: Seated in chair    Lower Body Bathing  Perineal  : Contact guard assistance(with standing aspects only; Mod I with seated aspect)  Position Performed: Seated in chair;Standing   Lower Body : Minimum assistance(difficulty reaching B feet/ankles)  Position Performed: Seated in chair    Upper Body Dressing Assistance  Hospital Gown: Minimum  assistance(with neck tie and HR monitor leads only)    Activity Tolerance:   O2 sats decreased to 81% on 45 L/min HFNC during UB/LB bathing while seated in chair this session. O2 sats decreased to upper 80s/low 90s with x2 RBs. BP remained stable with positional changes.     Please refer to the flowsheet for vital signs taken during this treatment.  After treatment:   ? Patient left in no apparent distress sitting up in chair  ? Patient left in no apparent distress in bed  ? Call bell left within reach  ? Nursing notified  ? Caregiver present  ? Bed alarm activated    COMMUNICATION/COLLABORATION:   The patient?s plan of care was discussed with: Registered Nurse and Patient     Benay Pillow, OTR/L  Time Calculation: 24 mins

## 2017-10-29 NOTE — Progress Notes (Signed)
Critical care interdisciplinary rounds held on 10/29/2017.  Following members present, Pharmacy, Diabetes Treatment, Case Management and Nutrition.  Led by Carolan Shiver, RN and Dr. Rivka Spring. Plan of care discussed.  See clinical pathway for plan of care and interventions and desired outcomes.

## 2017-10-29 NOTE — Progress Notes (Signed)
PHYSICAL THERAPY TREATMENT  Patient: Brittney Shelton (67 y.o. female)  Date: 10/29/2017  Diagnosis: Acute respiratory failure with hypoxemia (HCC) [J96.01] <principal problem not specified>       Precautions: Fall  Chart, physical therapy assessment, plan of care and goals were reviewed.    ASSESSMENT:  Pt received seated in bedside chair on 45LPM hi flow NC, agreeable to participation with therapy. Pt continues to make slow, steady gains towards therapy goals, remaining limited by respiratory status and poor activity tolerance/endurance. Pt mobilizes with SBA at most, including during sit<>stand transfer and brief ambulation trial w/ RW support. Gait slow and shuffled however steady with RW support. DOE noted with O2 sats 86% post activity however quickly recovering to >90% with seated rest and PLB. Continue to recommend [facility] PT at discharge.    Progression toward goals:  ?    Improving appropriately and progressing toward goals  ?    Improving slowly and progressing toward goals  ?    Not making progress toward goals and plan of care will be adjusted     PLAN:  Patient continues to benefit from skilled intervention to address the above impairments.  Continue treatment per established plan of care.  Discharge Recommendations:  Home Health  Further Equipment Recommendations for Discharge:  Owns necessary equipment      SUBJECTIVE:   Patient stated ?I'll try..?    OBJECTIVE DATA SUMMARY:   Critical Behavior:  Neurologic State: Alert  Orientation Level: Oriented X4  Cognition: Appropriate decision making, Follows commands     Functional Mobility Training:  Bed Mobility:           Scooting: Modified independent        Transfers:  Sit to Stand: Supervision  Stand to Sit: Supervision                             Balance:  Sitting: Intact  Standing: Impaired;With support  Standing - Static: Good;Constant support  Standing - Dynamic : Fair  Ambulation/Gait Training:  Distance (ft): 2 Feet (ft)   Assistive Device: Walker, rolling;Gait belt  Ambulation - Level of Assistance: Stand-by assistance        Gait Abnormalities: Shuffling gait        Base of Support: Widened     Speed/Cadence: Shuffled  Step Length: Left shortened;Right shortened                Therapeutic Exercises:   5x sit<>stand transfer  2x 10 BUE reaching exercises in standing w/ RW and SBA  Pain:  Pain Scale 1: Numeric (0 - 10)                 Activity Tolerance:   O2 sats 86% post activity on 45LPM hi flow NC  Please refer to the flowsheet for vital signs taken during this treatment.  After treatment:   ?    Patient left in no apparent distress sitting up in chair  ?    Patient left in no apparent distress in bed  ?    Call bell left within reach  ?    Nursing notified  ?    Caregiver present  ?    Bed alarm activated    COMMUNICATION/COLLABORATION:   The patient?s plan of care was discussed with: Registered Nurse    Demetrio Lapping, PT, DPT   Time Calculation: 20 mins

## 2017-10-29 NOTE — Progress Notes (Signed)
Nutrition Assessment:    RECOMMENDATIONS:   Continue Diabetic diet, K restriction remains appropriate    DIETITIANS INTERVENTIONS/PLAN:   Continue diet as tolerated  Monitor appetite/PO intake  Monitor BG/K     ASSESSMENT:   Pt admitted with acute respiratory failure.  PMH: COPD, CHF, DM.  Chart reviewed for LOS, case discussed during CCU rounds.  Pt has a fairly good appetite.  BG 97-191, may be high 2' steroids.  K was 5.4 but is now down to 4.5, K restriction remains appropriate.  Last BM noted on 5/28, she is on senokot.  No skin breakdown noted.     SUBJECTIVE/OBJECTIVE:     Diet Order: Consistent carb, Other (comment)(Low K )  % Eaten:    Patient Vitals for the past 72 hrs:   % Diet Eaten   10/27/17 1728 50 %   10/27/17 1234 100 %   10/27/17 0936 100 %   10/26/17 1800 50 %     Pertinent Medications:humalog, prednisone, senokot.     Chemistries:  Lab Results   Component Value Date/Time    Sodium 138 10/28/2017 03:47 AM    Potassium 4.5 10/28/2017 03:47 AM    Chloride 104 10/28/2017 03:47 AM    CO2 29 10/28/2017 03:47 AM    Anion gap 5 10/28/2017 03:47 AM    Glucose 116 (H) 10/28/2017 03:47 AM    BUN 41 (H) 10/28/2017 03:47 AM    Creatinine 1.21 (H) 10/28/2017 03:47 AM    BUN/Creatinine ratio 34 (H) 10/28/2017 03:47 AM    GFR est AA 54 (L) 10/28/2017 03:47 AM    GFR est non-AA 44 (L) 10/28/2017 03:47 AM    Calcium 8.1 (L) 10/28/2017 03:47 AM    AST (SGOT) 10 (L) 10/28/2017 03:47 AM    Alk. phosphatase 68 10/28/2017 03:47 AM    Protein, total 6.3 (L) 10/28/2017 03:47 AM    Albumin 2.9 (L) 10/28/2017 03:47 AM    Globulin 3.4 10/28/2017 03:47 AM    A-G Ratio 0.9 (L) 10/28/2017 03:47 AM    ALT (SGPT) 13 10/28/2017 03:47 AM      Anthropometrics: Height: '5\' 6"'  (167.6 cm) Weight: 132.5 kg (292 lb 1.8 oz)  '[]' bed scale    '[]' stated   '[]' unknown    IBW (%IBW):   ( ) UBW (%UBW):   (  %)    BMI: Body mass index is 47.15 kg/m??.    This BMI is indicative of:   '[]' Underweight   '[]' Normal   '[]' Overweight   '[]'  Obesity   '[x]'  Extreme Obesity (BMI>40)  Estimated Nutrition Needs (Based on): 2058 Kcals/day(MSJ 1871 x 1.1) , 88 g(1.5gPro/kg IBW ) Protein  Carbohydrate: At Least 130 g/day  Fluids: 2000 mL/day    Last BM: 5/28   '[x]' Active     '[]' Hyperactive  '[]' Hypoactive       '[]'  Absent   BS  Skin:    '[x]'  Intact   '[]'  Incision  '[]'  Breakdown   '[]'  DTI   '[]'  Tears/Excoriation/Abrasion  '[x]' Edema(+1-BUE; +3-BLE) '[]'  Other:   Wt Readings from Last 30 Encounters:   10/28/17 132.5 kg (292 lb 1.8 oz)   09/11/17 119.2 kg (262 lb 11.2 oz)   07/30/17 118.4 kg (261 lb)   06/19/17 117 kg (258 lb)   05/29/17 117.5 kg (259 lb)   05/28/17 117.7 kg (259 lb 8.4 oz)   05/18/17 117.5 kg (259 lb)   05/06/17 117.9 kg (260 lb)   05/01/17 118.6 kg (261 lb 8.5 oz)  04/30/17 118.8 kg (261 lb 12.8 oz)   04/28/17 118.8 kg (261 lb 12.8 oz)   04/28/17 118.8 kg (261 lb 12.8 oz)   04/04/17 119.4 kg (263 lb 4.8 oz)   01/06/17 135.2 kg (298 lb)   12/17/16 142.9 kg (315 lb)   12/13/16 136.1 kg (300 lb)   12/10/16 136.1 kg (300 lb)   09/12/16 131.2 kg (289 lb 4.8 oz)   08/06/16 130.6 kg (288 lb)   07/08/16 131.5 kg (290 lb)   06/20/16 132 kg (291 lb)   06/12/16 132.5 kg (292 lb)   05/21/16 134.7 kg (297 lb)   03/13/16 135.3 kg (298 lb 3.2 oz)   03/04/16 132.9 kg (293 lb)   02/29/16 133.8 kg (295 lb)   02/26/16 134.5 kg (296 lb 8 oz)   02/18/16 135.2 kg (298 lb)   02/13/16 135.6 kg (299 lb)   02/11/16 135.4 kg (298 lb 9.6 oz)      NUTRITION DIAGNOSES:   Problem:  No nutritional diagnosis at this time        NUTRITION INTERVENTIONS:  Meals/Snacks: General/healthful diet                  GOAL:   Pt will consume >70% of meals with BG <124m/dL and K WNL in 3-5 days.     Cultural, Religious, or Ethnic Dietary Needs: None     LEARNING NEEDS (Diet, Food/Nutrient-Drug Interaction):    '[x]'  None Identified   '[]'  Identified and Education Provided/Documented   '[]'  Identified and Pt declined/was not appropriate       '[x]'  Interdisciplinary Care Plan Reviewed/Documented    '[x]'  Participated in Discharge Planning: To be determined    '[x]'  Interdisciplinary Rounds     NUTRITION RISK:    '[]'  High              '[]'  Moderate           '[x]'   Low  '[x]'   Minimal/Uncompromised    PT SEEN FOR:    '[]'   MD Consult: '[]' Calorie Count      '[]' Diabetic Diet Education        '[]' Diet Education     '[]' Electrolyte Management     '[]' General Nutrition Management and Supplements     '[]' Management of Tube Feeding     '[]' TPN Recommendations    '[]'   RN Referral:  '[]' MST score >=2     '[]' Enteral/Parenteral Nutrition PTA     '[]' Pregnant: Gestational DM or Multigestation   '[]'   Low BMI  '[]'   Re-Screen   '[x]'   LOS   '[]'   NPO/clears x 5 days   '[]'   New TF/TPN    KEarney Mallet RD, [place]  Pager [phone] Weekend Pager [phone]

## 2017-10-29 NOTE — Progress Notes (Addendum)
1900 Report received from Deborha Payment, RN    2000 Assessment complete. Patient is alert and oriented. Resting comfortably in bed. No c/o pain at this time. Lung sounds are diminished, but clear. Patient is on Hi-flow nasal cannula @ 100% FI02. Bowel sounds are active. Pulses are palpable. Generalized edema to lower extremities. PIVx2 that are saline locked.     0000 Reassessment complete. No changes from previous.    0400 Reassessment complete. No changes from previous.    0700 Report given to Murvin Natal, RN

## 2017-10-30 ENCOUNTER — Inpatient Hospital Stay: Admit: 2017-10-30 | Payer: MEDICARE | Primary: Internal Medicine

## 2017-10-30 LAB — METABOLIC PANEL, COMPREHENSIVE
A-G Ratio: 0.9 — ABNORMAL LOW (ref 1.1–2.2)
ALT (SGPT): 11 U/L — ABNORMAL LOW (ref 12–78)
AST (SGOT): 12 U/L — ABNORMAL LOW (ref 15–37)
Albumin: 3 g/dL — ABNORMAL LOW (ref 3.5–5.0)
Alk. phosphatase: 60 U/L (ref 45–117)
Anion gap: 6 mmol/L (ref 5–15)
BUN/Creatinine ratio: 37 — ABNORMAL HIGH (ref 12–20)
BUN: 43 MG/DL — ABNORMAL HIGH (ref 6–20)
Bilirubin, total: 1.3 MG/DL — ABNORMAL HIGH (ref 0.2–1.0)
CO2: 29 mmol/L (ref 21–32)
Calcium: 8 MG/DL — ABNORMAL LOW (ref 8.5–10.1)
Chloride: 105 mmol/L (ref 97–108)
Creatinine: 1.16 MG/DL — ABNORMAL HIGH (ref 0.55–1.02)
GFR est AA: 56 mL/min/{1.73_m2} — ABNORMAL LOW (ref >60)
GFR est non-AA: 46 mL/min/{1.73_m2} — ABNORMAL LOW (ref >60)
Globulin: 3.5 g/dL (ref 2.0–4.0)
Glucose: 113 mg/dL — ABNORMAL HIGH (ref 65–100)
Potassium: 4.7 mmol/L (ref 3.5–5.1)
Protein, total: 6.5 g/dL (ref 6.4–8.2)
Sodium: 140 mmol/L (ref 136–145)

## 2017-10-30 LAB — MAGNESIUM: Magnesium: 2.3 mg/dL (ref 1.6–2.4)

## 2017-10-30 LAB — GLUCOSE, POC
Glucose (POC): 102 mg/dL — ABNORMAL HIGH (ref 65–100)
Glucose (POC): 140 mg/dL — ABNORMAL HIGH (ref 65–100)
Glucose (POC): 224 mg/dL — ABNORMAL HIGH (ref 65–100)

## 2017-10-30 LAB — PHOSPHORUS: Phosphorus: 3.8 MG/DL (ref 2.6–4.7)

## 2017-10-30 MED ORDER — FUROSEMIDE 40 MG TAB
40 mg | Freq: Every day | ORAL | Status: DC
Start: 2017-10-30 — End: 2017-11-03
  Administered 2017-10-30 – 2017-11-02 (×4): via ORAL

## 2017-10-30 MED FILL — IPRATROPIUM-ALBUTEROL 2.5 MG-0.5 MG/3 ML NEB SOLUTION: 2.5 mg-0.5 mg/3 ml | RESPIRATORY_TRACT | Qty: 3

## 2017-10-30 MED FILL — SENNA 8.6 MG TABLET: 8.6 mg | ORAL | Qty: 1

## 2017-10-30 MED FILL — PREDNISONE 20 MG TAB: 20 mg | ORAL | Qty: 1

## 2017-10-30 MED FILL — TOPROL XL 25 MG TABLET,EXTENDED RELEASE: 25 mg | ORAL | Qty: 1

## 2017-10-30 MED FILL — ASPIRIN 81 MG TAB, DELAYED RELEASE: 81 mg | ORAL | Qty: 1

## 2017-10-30 MED FILL — FUROSEMIDE 40 MG TAB: 40 mg | ORAL | Qty: 1

## 2017-10-30 MED FILL — INSULIN LISPRO 100 UNIT/ML INJECTION: 100 unit/mL | SUBCUTANEOUS | Qty: 1

## 2017-10-30 MED FILL — ENOXAPARIN 40 MG/0.4 ML SUB-Q SYRINGE: 40 mg/0.4 mL | SUBCUTANEOUS | Qty: 0.4

## 2017-10-30 NOTE — Progress Notes (Signed)
Hospitalist Progress Note  NAME: Brittney Shelton   DOB:  April 01, 1951   MRN:  [id number]       Assessment / Plan:  Acute on chronic respiratory failure with hypoxia POA  Acute on chronic diastolic CHF POA, Cor pulmonale POA  Right lower lobe pneumonia  COPD exacerbation POA  -clinically with very minimal improvement; remain on HFNC 45L  BIPAP prn   Baseline 4L oxygen requirement  Unclear how log it will take for lungs to recover   -pulmonary help was greatly appreciated    -completed Levaquin   -IV steroids --> PO prednisone, tapering  Jet nebs scheduled   -s/p diuresing   ??  Acute kidney injury POA  -last normal Cr 09/2017, admission 1.6 --> stable at 1.4 now   Good UO   Unclear exact etiology  -monitor closely. Avoid nephrotoxic drugs, adjust all meds to GFR.   ??  Essential HTN POA  -BP mostly at 110s -->  holding Norvasc  ??  DM type 2 POA  Diet controlled at home. BS acceptable now   Last HgBa1c 6.4 in Dec 2018  Hyperglycemic on steroids; refusing insulin, unfortunately   ??  Right pleural effusion POA  Small, doubt thoracentesis would contribute much  ??  Hyperkalemia, mild, resolved   Remote tobacco use quit 20 years ago  40 or above Morbid obesity / Body mass index is 47.86 kg/m??.    Code status: Full  Prophylaxis: Hep SQ     Recommended Disposition: remain in ICU      Subjective:     Chief Complaint / Reason for Physician Visit: follow up respiratory failure/ copd   HFNC 45 LPM.  Pt is c/o being tired from the hospital. Very frustrated that very is no improvement     Discussed with RN events overnight.     Review of Systems:  Symptom Y/N Comments  Symptom Y/N Comments   Fever/Chills n   Chest Pain n    Poor Appetite    Edema     Cough    Abdominal Pain n    Sputum    Joint Pain     SOB/DOE    Pruritis/Rash     Nausea/vomit n   Tolerating PT/OT     Diarrhea n   Tolerating Diet     Constipation n   Other       Could NOT obtain due to:      Objective:     VITALS:    Last 24hrs VS reviewed since prior progress note. Most recent are:  Patient Vitals for the past 24 hrs:   Temp Pulse Resp BP SpO2   10/30/17 1600 98.6 ??F (37 ??C) (!) 115 29 116/70 91 %   10/30/17 1552 ??? ??? ??? ??? 93 %   10/30/17 1500 ??? 92 22 99/77 94 %   10/30/17 1400 ??? 86 27 94/74 92 %   10/30/17 1300 ??? 85 22 106/77 93 %   10/30/17 1212 ??? ??? ??? ??? 90 %   10/30/17 1200 98.1 ??F (36.7 ??C) 86 22 113/74 (!) 89 %   10/30/17 1128 ??? ??? ??? ??? 91 %   10/30/17 1120 ??? ??? ??? ??? (!) 89 %   10/30/17 1118 ??? ??? ??? ??? (!) 85 %   10/30/17 1106 ??? ??? ??? ??? (!) 87 %   10/30/17 1105 ??? ??? ??? ??? (!) 85 %   10/30/17 1103 ??? ??? ??? ??? (!) 82 %  10/30/17 1100 ??? 88 23 ??? 90 %   10/30/17 1000 ??? 94 21 125/71 91 %   10/30/17 0905 ??? 90 27 ??? 93 %   10/30/17 0900 ??? 91 19 95/67 91 %   10/30/17 0812 ??? ??? ??? ??? 90 %   10/30/17 0800 97.9 ??F (36.6 ??C) 92 25 (!) 112/92 90 %   10/30/17 0700 ??? 85 25 107/66 93 %   10/30/17 0600 ??? 85 21 117/63 95 %   10/30/17 0500 ??? 87 21 119/64 95 %   10/30/17 0400 98.5 ??F (36.9 ??C) 87 21 108/48 91 %   10/30/17 0335 ??? ??? ??? ??? 97 %   10/30/17 0300 ??? 85 22 112/56 93 %   10/30/17 0200 ??? 88 22 114/55 96 %   10/30/17 0100 ??? 88 22 109/78 92 %   10/29/17 2330 ??? ??? ??? ??? 90 %   10/29/17 2300 ??? 83 23 98/61 91 %   10/29/17 2200 ??? 88 21 114/66 (!) 89 %   10/29/17 2100 ??? 91 21 107/68 92 %   10/29/17 2000 98.1 ??F (36.7 ??C) 92 24 103/56 (!) 89 %   10/29/17 1930 ??? ??? ??? ??? 90 %   10/29/17 1715 ??? 95 23 90/67 95 %       Intake/Output Summary (Last 24 hours) at 10/30/2017 1630  Last data filed at 10/30/2017 1335  Gross per 24 hour   Intake 740 ml   Output 1450 ml   Net -710 ml        PHYSICAL EXAM:  General: WD, WN. Alert, cooperative, no acute distress, morbidly obese  EENT:  EOMI. Anicteric sclerae. MMM  Resp:  Exam limited by habitus, no wheezing or rales  CV:  Regular  Rhythm,??+ edema  GI:  Soft, Non distended, Non tender. ??+Bowel sounds  Neurologic:?? Alert and oriented X 3, normal speech,   Psych:???? Fair insight.??Not anxious nor agitated     Reviewed most current lab test results and cultures  YES  Reviewed most current radiology test results   YES  Review and summation of old records today    NO  Reviewed patient's current orders and MAR    YES  PMH/SH reviewed - no change compared to H&P  ________________________________________________________________________  Care Plan discussed with:    Comments   Patient y    Family      RN y    Buyer, retail                        Multidiciplinary team rounds were held today with case manager, nursing, pharmacist and Higher education careers adviser.  Patient's plan of care was discussed; medications were reviewed and discharge planning was addressed.     ________________________________________________________________________  Total NON critical care TIME:  20   Minutes    Total CRITICAL CARE TIME Spent:   Minutes non procedure based      Comments   >50% of visit spent in counseling and coordination of care     ________________________________________________________________________  Guerry Bruin, MD     Procedures: see electronic medical records for all procedures/Xrays and details which were not copied into this note but were reviewed prior to creation of Plan.      LABS:  I reviewed today's most current labs and imaging studies.  Pertinent labs include:  Recent Labs     10/28/17  0347   WBC 10.1   HGB  12.1   HCT 37.8   PLT 174     Recent Labs     10/30/17  0615 10/28/17  0347   NA 140 138   K 4.7 4.5   CL 105 104   CO2 29 29   GLU 113* 116*   BUN 43* 41*   CREA 1.16* 1.21*   CA 8.0* 8.1*   MG 2.3  --    PHOS 3.8  --    ALB 3.0* 2.9*   TBILI 1.3* 1.4*   SGOT 12* 10*   ALT 11* 13       Signed: Guerry Bruin, MD

## 2017-10-30 NOTE — Progress Notes (Addendum)
[facility] if [facility] is able to [facility] for V/Q scan.  (Per Dr. Modesto CharonWong)     Patient currently on NRM 15lpm 100%; Sats are currently 85%. Patient did just move from bed to chair.       Will continue to monitor patient       1125: Patient sats remained at 87/88%. Placed patient back on heated high flow at 40LPM 100%. Patient sats now 91%.  Per Dr. Modesto CharonWong we will wait until Monday to reevaluate and  transport patient for V/Q scan.       Forestine NaJennifer Tuck, RRT

## 2017-10-30 NOTE — Progress Notes (Addendum)
[facility], Critical Care, and Sleep Medicine  Name: Brittney Shelton MRN: [id number]   DOB: [birth year] Hospital: [facility]   Date: 10/30/2017        IMPRESSION:   ?? Acute/chronic hypoxic/hypercapnic respiratory failure; on 4 LPm at home for many yrs since Brittney Shelton lived in [place]; now with Dr. Raiford Simmonds; am surprised still needing so much O2 based on XR appearance  ?? Community acquired pneumonia  ?? "COPD exacerbation" - Brittney Shelton has a diagnosis of COPD, but Brittney Shelton has no obstruction on PFTs; patient of Dr. Jayme Cloud  ?? Acute renal failure  ?? Severe Pulmonary HTN with dilated RV and RV failure on echo; CTA negative for PE; this did not happen overnight  ?? LVH/diastolic heart failure  ?? DM/hyperglycemia  ?? Obesity - ?OSA/OHS      PLAN:   ?? CCU   ?? Try to wean HFNC  ?? On bipap or HFNC-   ?? If we can keeps sats high enough next week, will order VQ scan next week   ?? Eventually needs diagnostic RHC, vasodilator trail but not sure if Brittney Shelton will ever become stable enough  ?? If Brittney Shelton declines, will need intubation  ?? Incentive spirometer- told pt may take awhile for her to recover and can make no predictions how fast  ?? Bronchodilators   ?? Empiric antibiotics to cpmplete a course  ?? Follow up cultures  ?? Diurese daily  ?? Will eventually need sleep study   ?? Monitor creatinine, same today  ?? Insulin   ?? DVT prophyaxis  ?? Slowly advance diet     Subjective/Interval History:   I have reviewed the flowsheet and previous day???s notes.    5/31 HFNC 45 LPM. Explained pathophysiology of PHTN and RV failure with pt. Had sleep study but "never told results". Explained to her that convetnional HTN meds does not help and may hurt PHTN. Tough problem    5/30 HFNC 45 LPM. OOB the other day. Slept without BIPAP. ROS otherwise negative    5/29 BIPAP then HFNC this AM. sats hovering close to 88%- 90%. No congestion. No fver. I > O since admission. No preipheral edema. CXr reviewed- no effusion     5/28 No acute events overnight.On bipap.   No severe distress    Objective:  Vital Signs:    Visit Vitals  BP (!) 112/92   Pulse 92   Temp 97.9 ??F (36.6 ??C)   Resp 25   Ht 5\' 6"  (1.676 m)   Wt 134.5 kg (296 lb 8.3 oz)   SpO2 90%   BMI 47.86 kg/m??       O2 Device: Heated, Hi flow nasal cannula   O2 Flow Rate (L/min): 45 l/min   Temp (24hrs), Avg:98.2 ??F (36.8 ??C), Min:97.9 ??F (36.6 ??C), Max:98.5 ??F (36.9 ??C)     Intake/Output:   Last shift:      05/31 0701 - 05/31 1900  In: -   Out: 500 [Urine:500]  Last 3 shifts: 05/29 1901 - 05/31 0700  In: 240 [P.O.:240]  Out: 900 [Urine:900]    Intake/Output Summary (Last 24 hours) at 10/30/2017 0844  Last data filed at 10/30/2017 0800  Gross per 24 hour   Intake 240 ml   Output 1000 ml   Net -760 ml     Hemodynamics:   PAP:   CO:     Wedge:   CI:     CVP:    SVR:  PVR:       Ventilator Settings:  Mode Rate Tidal Volume Pressure FiO2 PEEP            45 %       Peak airway pressure:      Minute ventilation: 14.5 l/min       Physical Exam   Constitutional: No distress. Nasal cannula in place.   HFNC   HENT:   Head: Normocephalic and atraumatic.   Eyes: No scleral icterus.   Cardiovascular: Normal rate and regular rhythm.   Pulmonary/Chest: Brittney Shelton has no wheezes. Brittney Shelton has rales in the right lower field.   Abdominal: Soft. Bowel sounds are normal. Brittney Shelton exhibits no distension. There is no tenderness.   Musculoskeletal: Brittney Shelton exhibits edema.   Neurological: Brittney Shelton is alert.   Skin: Skin is warm and dry. Brittney Shelton is not diaphoretic. Nails show no clubbing.     Data:     Current Facility-Administered Medications   Medication Dose Route Frequency   ??? predniSONE (DELTASONE) tablet 20 mg  20 mg Oral DAILY WITH BREAKFAST   ??? enoxaparin (LOVENOX) injection 40 mg  40 mg SubCUTAneous Q12H   ??? metoprolol succinate (TOPROL-XL) XL tablet 25 mg  25 mg Oral DAILY   ??? senna (SENOKOT) tablet 8.6 mg  1 Tab Oral DAILY   ??? insulin lispro (HUMALOG) injection   SubCUTAneous AC&HS    ??? sodium chloride (NS) flush 5-40 mL  5-40 mL IntraVENous Q8H   ??? albuterol-ipratropium (DUO-NEB) 2.5 MG-0.5 MG/3 ML  3 mL Nebulization Q4H RT   ??? aspirin delayed-release tablet 81 mg  81 mg Oral DAILY   ??? pravastatin (PRAVACHOL) tablet 20 mg  20 mg Oral QHS                Labs:  Recent Labs     10/28/17  0347   WBC 10.1   HGB 12.1   HCT 37.8   PLT 174     Recent Labs     10/30/17  0615 10/28/17  0347   NA 140 138   K 4.7 4.5   CL 105 104   CO2 29 29   GLU 113* 116*   BUN 43* 41*   CREA 1.16* 1.21*   CA 8.0* 8.1*   MG 2.3  --    PHOS 3.8  --    ALB 3.0* 2.9*   TBILI 1.3* 1.4*   SGOT 12* 10*   ALT 11* 13     Recent Labs     10/28/17  0600   PHI 7.398   PCO2I 46.8*   PO2I 58*   HCO3I 28.9*   FIO2I 100     Imaging:  I have personally reviewed the patient???s radiographs and have reviewed the reports:          Total critical care time exclusive of procedures:   minutes  Thayer HeadingsJohnny C Ida Milbrath, MD

## 2017-10-30 NOTE — Progress Notes (Addendum)
0700  Bedside and verbal report from Nancy NordmannWhitney Bennett, RN.  0800  Assessment completed; [facility] then to recliner at bedside.  Dr. Modesto CharonWong here to see patient.  0900  Continues up in the chair; took diet well.   1000  Remains up in the chair; assisted with AM care by PCT.  1200  Continues up in the chair; reassessment completed; attempt by RT to place patient on ventimask failed to obtain adequate sats. Placed back on hi-flow.  1400  Remains in chair; denies complaints. Sats 90-92% on 40 lpm hi-flow oxygen.  1600  Reassessment completed. Weaning hi-flow - currently @ 35 lpm.  1800  Continues up in the recliner. Took diet well.  1900  Bedside and verbal report given to Nancy NordmannWhitney Bennett, RN.

## 2017-10-30 NOTE — Progress Notes (Addendum)
1900 Report received from Murvin NatalMiriam Ferguson, RN    2000 Assessment complete.    0000 Reassessment complete. No changes from previous.    0400 Reassessment complete. No changes from previous.    0700 Report given to Fredric Mareonnie Throne, RN

## 2017-10-31 LAB — GLUCOSE, POC
Glucose (POC): 109 mg/dL — ABNORMAL HIGH (ref 65–100)
Glucose (POC): 165 mg/dL — ABNORMAL HIGH (ref 65–100)
Glucose (POC): 148 mg/dL — ABNORMAL HIGH (ref 65–100)

## 2017-10-31 MED ORDER — ARTIFICIAL TEARS (DEXTRAN 70-HYPROMELLOSE) 0.1 %-0.3 % EYE DROPS
OPHTHALMIC | Status: DC | PRN
Start: 2017-10-31 — End: 2017-11-10
  Administered 2017-11-08: 08:00:00 via OPHTHALMIC

## 2017-10-31 MED ORDER — IPRATROPIUM-ALBUTEROL 2.5 MG-0.5 MG/3 ML NEB SOLUTION
2.5 mg-0.5 mg/3 ml | Freq: Four times a day (QID) | RESPIRATORY_TRACT | Status: DC
Start: 2017-10-31 — End: 2017-11-03
  Administered 2017-10-31 – 2017-11-03 (×11): via RESPIRATORY_TRACT

## 2017-10-31 MED ORDER — POLYETHYLENE GLYCOL 3350 17 GRAM (100 %) ORAL POWDER PACKET
17 gram | Freq: Every day | ORAL | Status: DC
Start: 2017-10-31 — End: 2017-11-10
  Administered 2017-10-31 – 2017-11-10 (×8): via ORAL

## 2017-10-31 MED FILL — IPRATROPIUM-ALBUTEROL 2.5 MG-0.5 MG/3 ML NEB SOLUTION: 2.5 mg-0.5 mg/3 ml | RESPIRATORY_TRACT | Qty: 3

## 2017-10-31 MED FILL — ENOXAPARIN 40 MG/0.4 ML SUB-Q SYRINGE: 40 mg/0.4 mL | SUBCUTANEOUS | Qty: 0.4

## 2017-10-31 MED FILL — TOPROL XL 25 MG TABLET,EXTENDED RELEASE: 25 mg | ORAL | Qty: 1

## 2017-10-31 MED FILL — INSULIN LISPRO 100 UNIT/ML INJECTION: 100 unit/mL | SUBCUTANEOUS | Qty: 1

## 2017-10-31 MED FILL — BISAC-EVAC 10 MG RECTAL SUPPOSITORY: 10 mg | RECTAL | Qty: 1

## 2017-10-31 MED FILL — MAPAP (ACETAMINOPHEN) 325 MG TABLET: 325 mg | ORAL | Qty: 2

## 2017-10-31 MED FILL — NATURAL BALANCE TEARS 0.1 %-0.3 % EYE DROPS: OPHTHALMIC | Qty: 15

## 2017-10-31 MED FILL — ONDANSETRON (PF) 4 MG/2 ML INJECTION: 4 mg/2 mL | INTRAMUSCULAR | Qty: 2

## 2017-10-31 MED FILL — ASPIRIN 81 MG TAB, DELAYED RELEASE: 81 mg | ORAL | Qty: 1

## 2017-10-31 MED FILL — FUROSEMIDE 40 MG TAB: 40 mg | ORAL | Qty: 1

## 2017-10-31 MED FILL — PREDNISONE 20 MG TAB: 20 mg | ORAL | Qty: 1

## 2017-10-31 MED FILL — BD POSIFLUSH NORMAL SALINE 0.9 % INJECTION SYRINGE: INTRAMUSCULAR | Qty: 20

## 2017-10-31 MED FILL — PRAVASTATIN 10 MG TAB: 10 mg | ORAL | Qty: 2

## 2017-10-31 MED FILL — BD POSIFLUSH NORMAL SALINE 0.9 % INJECTION SYRINGE: INTRAMUSCULAR | Qty: 10

## 2017-10-31 MED FILL — HEALTHYLAX 17 GRAM ORAL POWDER PACKET: 17 gram | ORAL | Qty: 1

## 2017-10-31 MED FILL — SENNA 8.6 MG TABLET: 8.6 mg | ORAL | Qty: 1

## 2017-10-31 NOTE — Progress Notes (Signed)
[facility]  Name: Brittney LimesMary S Shelton MRN: [id number]   DOB: 02-08-1951 [facility]   Date: 10/31/2017        IMPRESSION:   ?? Acute/chronic hypoxic/hypercapnic respiratory failure; on 4 LPm at home for many yrs [place]; now with Dr. Raiford Simmondsomah; am surprised still needing so much O2 based on XR appearance  ?? Community acquired pneumonia  ?? "COPD exacerbation" - she has a diagnosis of COPD, but she has no obstruction on PFTs; patient of Dr. Jayme CloudGonzalez  ?? Acute renal failure  ?? Severe Pulmonary HTN with dilated RV and RV failure on echo; CTA negative for PE; this did not happen overnight  ?? LVH/diastolic heart failure  ?? DM/hyperglycemia  ?? Obesity - ?OSA/OHS  ?? Constipation, nausea      PLAN:   ?? CCU   ?? wean HFNC  ?? On bipap or HFNC-   ?? If we can keeps sats high enough next week, will order VQ scan next week   ?? Eventually needs diagnostic RHC, vasodilator trail but not sure if she will ever become stable enough  ?? If she declines, will need intubation  ?? Incentive spirometer- told pt may take awhile for her to recover and can make no predictions how fast  ?? Bronchodilators   ?? Empiric antibiotics to cpmplete a course  ?? Follow up cultures  ?? Diurese daily  ?? Will eventually need sleep study   ?? Monitor creatinine, trending in the right direction  ?? Insulin   ?? DVT prophyaxis  ?? Slowly advance diet  ?? Miralax ordered     Subjective/Interval History:   I have reviewed the flowsheet and previous day???s notes.  6/1 feels bloated.  Also nauseated and threw up some of her lunch.  No abd pain, but hasn't had a good BM while here.    HFNC has been decreased to ~20L and 70%. O2 sats accetable at rest, but decrease with movement.      5/31 HFNC 45 LPM. Explained pathophysiology of PHTN and RV failure with pt. Had sleep study but "never told results". Explained to her that  convetnional HTN meds does not help and may hurt PHTN. Tough problem    5/30 HFNC 45 LPM. OOB the other day. Slept without BIPAP. ROS otherwise negative    5/29 BIPAP then HFNC this AM. sats hovering close to 88%- 90%. No congestion. No fver. I > O since admission. No preipheral edema. CXr reviewed- no effusion    5/28 No acute events overnight.On bipap.   No severe distress    Objective:  Vital Signs:    Visit Vitals  BP 129/78   Pulse 85   Temp 97.7 ??F (36.5 ??C)   Resp 24   Ht 5\' 6"  (1.676 m)   Wt 134.5 kg (296 lb 8.3 oz)   SpO2 92%   BMI 47.86 kg/m??       O2 Device: Heated, Hi flow nasal cannula   O2 Flow Rate (L/min): 25 l/min   Temp (24hrs), Avg:98.2 ??F (36.8 ??C), Min:97.7 ??F (36.5 ??C), Max:98.6 ??F (37 ??C)     Intake/Output:   Last shift:      06/01 0701 - 06/01 1900  In: -   Out: 400 [Urine:400]  Last 3 shifts: 05/30 1901 - 06/01 0700  In: 800 [P.O.:800]  Out: 1450 [Urine:1450]    Intake/Output Summary (Last 24 hours) at 10/31/2017 1334  Last data filed at 10/31/2017 1000  Gross per 24 hour   Intake 300 ml   Output 850 ml   Net -550 ml     Hemodynamics:   PAP:   CO:     Wedge:   CI:     CVP:    SVR:       PVR:       Ventilator Settings:  Mode Rate Tidal Volume Pressure FiO2 PEEP            70 %       Peak airway pressure:      Minute ventilation: 14.5 l/min       Physical Exam   Constitutional: No distress. Nasal cannula in place.   HFNC   HENT:   Head: Normocephalic and atraumatic.   Eyes: No scleral icterus.   Cardiovascular: Normal rate and regular rhythm.   Pulmonary/Chest: She has no wheezes. She has rales in the right lower field.   Abdominal: Soft. Bowel sounds are normal. She exhibits no distension. There is no tenderness.   Musculoskeletal: She exhibits edema.   Neurological: She is alert.   Skin: Skin is warm and dry. She is not diaphoretic. Nails show no clubbing.     Data:     Current Facility-Administered Medications   Medication Dose Route Frequency    ??? albuterol-ipratropium (DUO-NEB) 2.5 MG-0.5 MG/3 ML  3 mL Nebulization Q6H RT   ??? furosemide (LASIX) tablet 40 mg  40 mg Oral DAILY   ??? predniSONE (DELTASONE) tablet 20 mg  20 mg Oral DAILY WITH BREAKFAST   ??? enoxaparin (LOVENOX) injection 40 mg  40 mg SubCUTAneous Q12H   ??? metoprolol succinate (TOPROL-XL) XL tablet 25 mg  25 mg Oral DAILY   ??? senna (SENOKOT) tablet 8.6 mg  1 Tab Oral DAILY   ??? insulin lispro (HUMALOG) injection   SubCUTAneous AC&HS   ??? sodium chloride (NS) flush 5-40 mL  5-40 mL IntraVENous Q8H   ??? aspirin delayed-release tablet 81 mg  81 mg Oral DAILY   ??? pravastatin (PRAVACHOL) tablet 20 mg  20 mg Oral QHS                Labs:  No results for input(s): WBC, HGB, HCT, PLT, HGBEXT, HCTEXT, PLTEXT, HGBEXT, HCTEXT, PLTEXT in the last 72 hours.  Recent Labs     10/30/17  0615   NA 140   K 4.7   CL 105   CO2 29   GLU 113*   BUN 43*   CREA 1.16*   CA 8.0*   MG 2.3   PHOS 3.8   ALB 3.0*   TBILI 1.3*   SGOT 12*   ALT 11*     No results for input(s): PHI, PCO2I, PO2I, HCO3I, FIO2I in the last 72 hours.  Imaging:  I have personally reviewed the patient???s radiographs and have reviewed the reports:            Christel Mormon, PA

## 2017-10-31 NOTE — Progress Notes (Signed)
C/o feeling constipated.  "ducolax didn't help me".

## 2017-10-31 NOTE — Progress Notes (Signed)
Ambulated up to chair with 1 assist.

## 2017-10-31 NOTE — Progress Notes (Signed)
Problem: Pressure Injury - Risk of  Goal: *Prevention of pressure injury  Description  Document Braden Scale and appropriate interventions in the flowsheet.  Outcome: Progressing Towards Goal     Problem: Patient Education: Go to Patient Education Activity  Goal: Patient/Family Education  Outcome: Progressing Towards Goal     Problem: Chronic Obstructive Pulmonary Disease (COPD)  Goal: *Oxygen saturation during activity within specified parameters  Outcome: Progressing Towards Goal  Goal: *Able to remain out of bed as prescribed  Outcome: Progressing Towards Goal  Goal: *Absence of hypoxia  Outcome: Progressing Towards Goal  Goal: *Optimize nutritional status  Outcome: Progressing Towards Goal     Problem: Patient Education: Go to Patient Education Activity  Goal: Patient/Family Education  Outcome: Progressing Towards Goal     Problem: Patient Education: Go to Patient Education Activity  Goal: Patient/Family Education  Outcome: Progressing Towards Goal

## 2017-10-31 NOTE — Progress Notes (Signed)
Patient had no appetite today. States she has been uncomfortable feeling bloated today. No BM today despite miralax.

## 2017-10-31 NOTE — Progress Notes (Signed)
miralax given as ordered.  Unable to eat due to nausea and "feeling bloated".    States "I need a good blow out".

## 2017-10-31 NOTE — Progress Notes (Addendum)
Ambulated to bedside commode.  Then returned to bed. Respirations l;abored,O2 sat driopped to 81%.    Slowly returned back to 90% with pursed lip breathing.  Will monitor.

## 2017-10-31 NOTE — Progress Notes (Signed)
Hospitalist Progress Note  NAME: Brittney Shelton   DOB:  03-30-1951   MRN:  [id number]       Assessment / Plan:  Acute on chronic respiratory failure with hypoxia POA  Acute on chronic diastolic CHF POA, Cor pulmonale POA  Right lower lobe pneumonia  COPD exacerbation POA  -clinically: continued with very minimal improvement  remain on HFNC 45L, desaturating easily with minimal exertion   BIPAP prn   Baseline 4L oxygen requirement  Unclear how log it will take for lungs to recover   -pulmonary help was greatly appreciated    -completed Levaquin   -IV steroids --> PO prednisone, tapering  Jet nebs scheduled   -s/p diuresing   ??  Acute kidney injury POA  -last normal Cr 09/2017, admission 1.6 --> stable at 1.4 now   Good UO   Unclear exact etiology  -monitor closely. Avoid nephrotoxic drugs, adjust all meds to GFR.   ??  Essential HTN POA  -BP mostly at 110s -->  holding Norvasc  ??  DM type 2 POA  Diet controlled at home. BS acceptable now   Last HgBa1c 6.4 in Dec 2018  Hyperglycemic on steroids; refusing insulin, unfortunately   ??  Right pleural effusion POA  Small, doubt thoracentesis would contribute much  ??  Hyperkalemia, mild, resolved   Remote tobacco use quit 20 years ago  40 or above Morbid obesity / Body mass index is 47.86 kg/m??.    Code status: Full  Prophylaxis: Hep SQ     Recommended Disposition: remain in ICU      Subjective:     Chief Complaint / Reason for Physician Visit: follow up respiratory failure/ copd   Easily desaturates: Ambulated to bedside commode.  Then returned to bed. Respirations labored,O2 sat driopped to 81%.   Slowly returned back to 90% with pursed lip breathing.    Remain on HFNC     Discussed with RN events overnight.     Review of Systems:  Symptom Y/N Comments  Symptom Y/N Comments   Fever/Chills n   Chest Pain n    Poor Appetite    Edema     Cough    Abdominal Pain n    Sputum    Joint Pain     SOB/DOE    Pruritis/Rash     Nausea/vomit n   Tolerating PT/OT      Diarrhea n   Tolerating Diet     Constipation n   Other       Could NOT obtain due to:      Objective:     VITALS:   Last 24hrs VS reviewed since prior progress note. Most recent are:  Patient Vitals for the past 24 hrs:   Temp Pulse Resp BP SpO2   10/31/17 1200 ??? 85 24 129/78 ???   10/31/17 1100 ??? 86 23 115/75 92 %   10/31/17 1003 ??? ??? ??? ??? 95 %   10/31/17 1000 ??? 85 23 113/64 97 %   10/31/17 0900 ??? 87 23 ??? 92 %   10/31/17 0809 ??? ??? ??? ??? 90 %   10/31/17 0800 97.7 ??F (36.5 ??C) 84 23 (!) 86/59 93 %   10/31/17 0600 ??? 87 22 ??? (!) 89 %   10/31/17 0500 ??? 86 23 ??? 91 %   10/31/17 0400 ??? 88 23 ??? 92 %   10/31/17 0300 ??? 85 22 ??? (!) 88 %   10/31/17 0200 ??? 89 23  102/72 (!) 88 %   10/31/17 0100 ??? 88 21 94/63 91 %   10/31/17 0000 ??? 90 22 104/59 92 %   10/30/17 2305 ??? ??? ??? ??? 90 %   10/30/17 2300 ??? 88 20 105/72 (!) 88 %   10/30/17 2200 ??? 89 23 98/66 (!) 85 %   10/30/17 2100 ??? 89 22 92/61 93 %   10/30/17 2038 ??? ??? ??? ??? 95 %   10/30/17 2000 98.2 ??F (36.8 ??C) 88 26 (!) 89/61 92 %   10/30/17 1900 ??? 95 21 (!) 87/56 90 %   10/30/17 1800 ??? 93 25 112/66 (!) 86 %   10/30/17 1700 ??? (!) 122 25 106/76 (!) 87 %   10/30/17 1600 98.6 ??F (37 ??C) (!) 115 29 116/70 91 %   10/30/17 1552 ??? ??? ??? ??? 93 %   10/30/17 1500 ??? 92 22 99/77 94 %       Intake/Output Summary (Last 24 hours) at 10/31/2017 1400  Last data filed at 10/31/2017 1000  Gross per 24 hour   Intake 300 ml   Output 400 ml   Net -100 ml        PHYSICAL EXAM:  General: WD, WN. Alert, cooperative, no acute distress, morbidly obese  EENT:  EOMI. Anicteric sclerae. MMM  Resp:  Exam limited by habitus, no wheezing or rales  CV:  Regular  Rhythm,??+ edema  GI:  Soft, Non distended, Non tender. ??+Bowel sounds  Neurologic:?? Alert and oriented X 3, normal speech,   Psych:???? Fair insight.??Not anxious nor agitated    Reviewed most current lab test results and cultures  YES  Reviewed most current radiology test results   YES  Review and summation of old records today    NO   Reviewed patient's current orders and MAR    YES  PMH/SH reviewed - no change compared to H&P  ________________________________________________________________________  Care Plan discussed with:    Comments   Patient y    Family      RN y    Buyer, retailCare Manager     Consultant                        Multidiciplinary team rounds were held today with case manager, nursing, pharmacist and Higher education careers adviserclinical coordinator.  Patient's plan of care was discussed; medications were reviewed and discharge planning was addressed.     ________________________________________________________________________  Total NON critical care TIME:  20   Minutes    Total CRITICAL CARE TIME Spent:   Minutes non procedure based      Comments   >50% of visit spent in counseling and coordination of care     ________________________________________________________________________  Guerry BruinMaria Dala Breault, MD     Procedures: see electronic medical records for all procedures/Xrays and details which were not copied into this note but were reviewed prior to creation of Plan.      LABS:  I reviewed today's most current labs and imaging studies.  Pertinent labs include:  No results for input(s): WBC, HGB, HCT, PLT, HGBEXT, HCTEXT, PLTEXT, HGBEXT, HCTEXT, PLTEXT in the last 72 hours.  Recent Labs     10/30/17  0615   NA 140   K 4.7   CL 105   CO2 29   GLU 113*   BUN 43*   CREA 1.16*   CA 8.0*   MG 2.3   PHOS 3.8   ALB 3.0*   TBILI 1.3*  SGOT 12*   ALT 11*       Signed: Guerry Bruin, MD

## 2017-10-31 DEATH — deceased

## 2017-11-01 ENCOUNTER — Inpatient Hospital Stay: Admit: 2017-11-01 | Payer: MEDICARE | Primary: Internal Medicine

## 2017-11-01 LAB — GLUCOSE, POC
Glucose (POC): 167 mg/dL — ABNORMAL HIGH (ref 65–100)
Glucose (POC): 132 mg/dL — ABNORMAL HIGH (ref 65–100)
Glucose (POC): 213 mg/dL — ABNORMAL HIGH (ref 65–100)
Glucose (POC): 196 mg/dL — ABNORMAL HIGH (ref 65–100)

## 2017-11-01 LAB — METABOLIC PANEL, BASIC
Anion gap: 6 mmol/L (ref 5–15)
BUN/Creatinine ratio: 35 — ABNORMAL HIGH (ref 12–20)
BUN: 49 MG/DL — ABNORMAL HIGH (ref 6–20)
CO2: 26 mmol/L (ref 21–32)
Calcium: 8.3 MG/DL — ABNORMAL LOW (ref 8.5–10.1)
Chloride: 105 mmol/L (ref 97–108)
Creatinine: 1.39 MG/DL — ABNORMAL HIGH (ref 0.55–1.02)
GFR est AA: 46 mL/min/{1.73_m2} — ABNORMAL LOW (ref >60)
GFR est non-AA: 38 mL/min/{1.73_m2} — ABNORMAL LOW (ref >60)
Glucose: 127 mg/dL — ABNORMAL HIGH (ref 65–100)
Potassium: 4.4 mmol/L (ref 3.5–5.1)
Sodium: 137 mmol/L (ref 136–145)

## 2017-11-01 MED ORDER — SENNOSIDES-DOCUSATE SODIUM 8.6 MG-50 MG TAB
Freq: Two times a day (BID) | ORAL | Status: DC
Start: 2017-11-01 — End: 2017-11-10
  Administered 2017-11-01 – 2017-11-10 (×11): via ORAL

## 2017-11-01 MED ORDER — LACTULOSE 10 GRAM/15 ML ORAL SOLUTION
10 gram/15 mL | ORAL | Status: AC
Start: 2017-11-01 — End: 2017-11-01
  Administered 2017-11-01: 18:00:00 via ORAL

## 2017-11-01 MED FILL — LACTULOSE 20 GRAM/30 ML ORAL SOLUTION: 20 gram/30 mL | ORAL | Qty: 30

## 2017-11-01 MED FILL — BD POSIFLUSH NORMAL SALINE 0.9 % INJECTION SYRINGE: INTRAMUSCULAR | Qty: 10

## 2017-11-01 MED FILL — IPRATROPIUM-ALBUTEROL 2.5 MG-0.5 MG/3 ML NEB SOLUTION: 2.5 mg-0.5 mg/3 ml | RESPIRATORY_TRACT | Qty: 3

## 2017-11-01 MED FILL — ONDANSETRON (PF) 4 MG/2 ML INJECTION: 4 mg/2 mL | INTRAMUSCULAR | Qty: 2

## 2017-11-01 MED FILL — MAPAP (ACETAMINOPHEN) 325 MG TABLET: 325 mg | ORAL | Qty: 2

## 2017-11-01 MED FILL — BD POSIFLUSH NORMAL SALINE 0.9 % INJECTION SYRINGE: INTRAMUSCULAR | Qty: 40

## 2017-11-01 MED FILL — ASPIRIN 81 MG TAB, DELAYED RELEASE: 81 mg | ORAL | Qty: 1

## 2017-11-01 MED FILL — SENNA 8.6 MG TABLET: 8.6 mg | ORAL | Qty: 1

## 2017-11-01 MED FILL — TOPROL XL 25 MG TABLET,EXTENDED RELEASE: 25 mg | ORAL | Qty: 1

## 2017-11-01 MED FILL — INSULIN LISPRO 100 UNIT/ML INJECTION: 100 unit/mL | SUBCUTANEOUS | Qty: 1

## 2017-11-01 MED FILL — HEALTHYLAX 17 GRAM ORAL POWDER PACKET: 17 gram | ORAL | Qty: 1

## 2017-11-01 MED FILL — PRAVASTATIN 10 MG TAB: 10 mg | ORAL | Qty: 2

## 2017-11-01 MED FILL — PREDNISONE 20 MG TAB: 20 mg | ORAL | Qty: 1

## 2017-11-01 MED FILL — ENOXAPARIN 40 MG/0.4 ML SUB-Q SYRINGE: 40 mg/0.4 mL | SUBCUTANEOUS | Qty: 0.4

## 2017-11-01 MED FILL — DOK PLUS 8.6 MG-50 MG TABLET: ORAL | Qty: 2

## 2017-11-01 MED FILL — FUROSEMIDE 40 MG TAB: 40 mg | ORAL | Qty: 1

## 2017-11-01 NOTE — Progress Notes (Signed)
[facility], Critical Care, and Sleep Medicine  Name: Brittney Shelton MRN: [id number]   DOB: January 15, 1951 Hospital: [facility]   Date: 11/01/2017        IMPRESSION:   ?? Acute/chronic hypoxic/hypercapnic respiratory failure; on 4 LPm at home for many yrs since she lived in [place]; now with Dr. Raiford Simmonds; am surprised still needing so much O2 based on XR appearance  ?? Community acquired pneumonia  ?? "COPD exacerbation" - she has a diagnosis of COPD, but she has no obstruction on PFTs; patient of Dr. Jayme Cloud  ?? Acute renal failure  ?? Severe Pulmonary HTN with dilated RV and RV failure on echo; CTA negative for PE; this did not happen overnight  ?? LVH/diastolic heart failure  ?? DM/hyperglycemia  ?? Obesity - ?OSA/OHS  ?? Constipation, nausea      PLAN:   ?? CCU   ?? wean HFNC  ?? On bipap or HFNC  ?? If we can keeps sats high enough next week, will order VQ scan next week   ?? Eventually needs diagnostic RHC, vasodilator trail but not sure if she will ever become stable enough  ?? If she declines, will need intubation  ?? Incentive spirometer- told pt may take awhile for her to recover and can make no predictions how fast  ?? Bronchodilators   ?? Empiric antibiotics to cpmplete a course  ?? Follow up cultures  ?? Diurese daily  ?? Will eventually need sleep study   ?? Monitor creatinine, trending in the right direction  ?? Insulin   ?? DVT prophyaxis  ?? Slowly advance diet  ?? Miralax , Zofran     Subjective/Interval History:   I have reviewed the flowsheet and previous day???s notes.  6/2 continues to have nausea. States she had a BM yesterday and this morning. No abd bloating or pain. Breathing maybe a little better.  HFNC 25L, 70%    6/1 feels bloated.  Also nauseated and threw up some of her lunch.  No abd pain, but hasn't had a good BM while here.    HFNC has been decreased to ~20L and 70%. O2 sats accetable at rest, but decrease with movement.       5/31 HFNC 45 LPM. Explained pathophysiology of PHTN and RV failure with pt. Had sleep study but "never told results". Explained to her that convetnional HTN meds does not help and may hurt PHTN. Tough problem    5/30 HFNC 45 LPM. OOB the other day. Slept without BIPAP. ROS otherwise negative    5/29 BIPAP then HFNC this AM. sats hovering close to 88%- 90%. No congestion. No fver. I > O since admission. No preipheral edema. CXr reviewed- no effusion    5/28 No acute events overnight.On bipap.   No severe distress    Objective:  Vital Signs:    Visit Vitals  BP 103/61   Pulse 96   Temp 97.9 ??F (36.6 ??C)   Resp 20   Ht 5\' 6"  (1.676 m)   Wt 131.3 kg (289 lb 6.4 oz)   SpO2 92%   BMI 46.71 kg/m??       O2 Device: Heated, Hi flow nasal cannula   O2 Flow Rate (L/min): 25 l/min   Temp (24hrs), Avg:97.7 ??F (36.5 ??C), Min:97.5 ??F (36.4 ??C), Max:97.9 ??F (36.6 ??C)     Intake/Output:   Last shift:      No intake/output data recorded.  Last 3 shifts: 05/31 1901 -  06/02 0700  In: 1022 [P.O.:1022]  Out: 620 [Urine:500]    Intake/Output Summary (Last 24 hours) at 11/01/2017 1110  Last data filed at 11/01/2017 0015  Gross per 24 hour   Intake 722 ml   Output 220 ml   Net 502 ml       Physical Exam   Constitutional: No distress. Nasal cannula in place.   HFNC   HENT:   Head: Normocephalic and atraumatic.   Eyes: No scleral icterus.   Cardiovascular: Normal rate and regular rhythm.   Pulmonary/Chest: She has no wheezes. She has no rales.   Ant/lat exam   Abdominal: Soft. Bowel sounds are normal. She exhibits no distension. There is no tenderness. There is no rebound.   Musculoskeletal: She exhibits edema.   Neurological: She is alert.   Skin: Skin is warm and dry. She is not diaphoretic. Nails show no clubbing.     Data:     Current Facility-Administered Medications   Medication Dose Route Frequency   ??? albuterol-ipratropium (DUO-NEB) 2.5 MG-0.5 MG/3 ML  3 mL Nebulization Q6H RT    ??? polyethylene glycol (MIRALAX) packet 17 g  17 g Oral DAILY   ??? furosemide (LASIX) tablet 40 mg  40 mg Oral DAILY   ??? predniSONE (DELTASONE) tablet 20 mg  20 mg Oral DAILY WITH BREAKFAST   ??? enoxaparin (LOVENOX) injection 40 mg  40 mg SubCUTAneous Q12H   ??? metoprolol succinate (TOPROL-XL) XL tablet 25 mg  25 mg Oral DAILY   ??? senna (SENOKOT) tablet 8.6 mg  1 Tab Oral DAILY   ??? insulin lispro (HUMALOG) injection   SubCUTAneous AC&HS   ??? sodium chloride (NS) flush 5-40 mL  5-40 mL IntraVENous Q8H   ??? aspirin delayed-release tablet 81 mg  81 mg Oral DAILY   ??? pravastatin (PRAVACHOL) tablet 20 mg  20 mg Oral QHS                Labs:  No results for input(s): WBC, HGB, HCT, PLT, HGBEXT, HCTEXT, PLTEXT, HGBEXT, HCTEXT, PLTEXT in the last 72 hours.  Recent Labs     11/01/17  0513 10/30/17  0615   NA 137 140   K 4.4 4.7   CL 105 105   CO2 26 29   GLU 127* 113*   BUN 49* 43*   CREA 1.39* 1.16*   CA 8.3* 8.0*   MG  --  2.3   PHOS  --  3.8   ALB  --  3.0*   TBILI  --  1.3*   SGOT  --  12*   ALT  --  11*     No results for input(s): PHI, PCO2I, PO2I, HCO3I, FIO2I in the last 72 hours.  Imaging:  I have personally reviewed the patient???s radiographs and have reviewed the reports:            Christel MormonEmily A Shirlie Enck, PA

## 2017-11-01 NOTE — Other (Addendum)
Bedside and Verbal shift change report received from J.Karyl KinnierFreeman,RN (offgoing nurse). Report included the following information SBAR, Kardex, ED Summary, Procedure Summary, Intake/Output, MAR, Accordion, Recent Results, Med Rec Status, Cardiac Rhythm NSR, Alarm Parameters  and Quality Measures. Received OOB in the chair, HI-Flow in use.  0900:Noted with a flat affect, no chest discomfort, but she remains pleasant and cooperative with her plan of care. SOB on exertion.  1100: [facility] bed, her gait remains slow and steady with the assistance of the walker.   1200:She is accepting liquids, without c/o nausea or regurgitation.   1300:Continue the current plan of care.   1430:[facility], her gait remains slow and steady. She had excellent results from the Prune juice.   1600:Poor appetite today, however pleasant and cooperative with her plan of care.    1800:She accepted a couple of bites of soup and fruit for dinner, no further c/o nausea or regurgitation.  1922:Bedside and Verbal shift change report given to M.Dutta,RN (Cabin crewoncoming nurse) by myself Physiological scientist(offgoing nurse). Report included the following information SBAR, Kardex, ED Summary, Procedure Summary, Intake/Output, MAR, Accordion, Recent Results, Med Rec Status, Cardiac Rhythm NSr, Alarm Parameters  and Quality Measures. She is resting with her eyes closed.

## 2017-11-01 NOTE — Progress Notes (Addendum)
Hospitalist Progress Note  NAME: Brittney Shelton   DOB:  Dec 25, 1950   MRN:  [id number]       Assessment / Plan:  Nausea/vomiting  Likely constipated  -aggressive bowel regiment  -check KUB     Acute on chronic respiratory failure with hypoxia POA  Acute on chronic diastolic CHF POA, Cor pulmonale POA  Right lower lobe pneumonia  COPD exacerbation POA  -clinically: not changed a lot over last week; vomited last night   remain on HFNC 45L, desaturating easily with minimal exertion   BIPAP prn   Baseline 4L oxygen requirement  Unclear how log it will take for lungs to recover   -pulmonary help was greatly appreciated    -completed Levaquin   -IV steroids --> PO prednisone, tapering  Jet nebs scheduled   -s/p diuresing   ??  Acute kidney injury POA  -last normal Cr 09/2017, admission 1.6 --> stable at 1.4 now   Good UO   Unclear exact etiology  -monitor closely. Avoid nephrotoxic drugs, adjust all meds to GFR.   ??  Essential HTN POA  -BP mostly at 110s -->  holding Norvasc  ??  DM type 2 POA  Diet controlled at home. BS acceptable now   Last HgBa1c 6.4 in Dec 2018  Hyperglycemic on steroids; refusing insulin, unfortunately   ??  Right pleural effusion POA  Small, doubt thoracentesis would contribute much  ??  Hyperkalemia, mild, resolved   Remote tobacco use quit 20 years ago  40 or above Morbid obesity / Body mass index is 46.71 kg/m??.    Code status: Full  Prophylaxis: Hep SQ     Recommended Disposition: remain in ICU      Subjective:     Chief Complaint / Reason for Physician Visit: follow up respiratory failure/ copd   Easily desaturates with minimal exertion  Vomited last night, no nausea now  Had small BM last night     Discussed with RN events overnight.     Review of Systems:  Symptom Y/N Comments  Symptom Y/N Comments   Fever/Chills n   Chest Pain n    Poor Appetite    Edema     Cough    Abdominal Pain n    Sputum    Joint Pain     SOB/DOE    Pruritis/Rash     Nausea/vomit n   Tolerating PT/OT      Diarrhea n   Tolerating Diet     Constipation n   Other       Could NOT obtain due to:      Objective:     VITALS:   Last 24hrs VS reviewed since prior progress note. Most recent are:  Patient Vitals for the past 24 hrs:   Temp Pulse Resp BP SpO2   11/01/17 1300 ??? 88 24 ??? 92 %   11/01/17 1200 ??? 89 20 112/69 (!) 88 %   11/01/17 1149 97.7 ??F (36.5 ??C) 88 24 112/74 91 %   11/01/17 1100 ??? 96 20 103/61 92 %   11/01/17 1000 ??? 90 27 108/77 96 %   11/01/17 0900 ??? 90 26 110/67 96 %   11/01/17 0800 ??? 87 24 104/60 94 %   11/01/17 0700 97.9 ??F (36.6 ??C) 88 22 120/81 94 %   11/01/17 0600 97.6 ??F (36.4 ??C) 90 24 101/59 94 %   11/01/17 0430 97.9 ??F (36.6 ??C) 88 23 119/61 94 %   11/01/17 0349  97.6 ??F (36.4 ??C) 86 24 102/59 ???   11/01/17 0222 97.6 ??F (36.4 ??C) 88 20 104/55 95 %   11/01/17 0128 97.9 ??F (36.6 ??C) 85 21 99/58 95 %   11/01/17 0109 ??? ??? ??? ??? 95 %   11/01/17 0015 97.7 ??F (36.5 ??C) 88 23 102/60 93 %   10/31/17 2327 97.6 ??F (36.4 ??C) 86 21 131/82 94 %   10/31/17 2230 97.9 ??F (36.6 ??C) 86 23 111/73 93 %   10/31/17 2000 97.5 ??F (36.4 ??C) 89 24 119/80 99 %   10/31/17 1913 ??? ??? ??? ??? 95 %   10/31/17 1600 ??? 88 25 ??? 99 %   10/31/17 1559 97.6 ??F (36.4 ??C) ??? ??? ??? ???   10/31/17 1442 ??? 91 27 140/90 90 %   10/31/17 1422 ??? ??? ??? ??? (!) 88 %       Intake/Output Summary (Last 24 hours) at 11/01/2017 1312  Last data filed at 11/01/2017 0015  Gross per 24 hour   Intake 522 ml   Output 220 ml   Net 302 ml        PHYSICAL EXAM:  General: WD, WN. Alert, cooperative, no acute distress, morbidly obese  EENT:  EOMI. Anicteric sclerae. MMM  Resp:  Exam limited by habitus, no wheezing or rales  CV:  Regular  Rhythm,??+ edema  GI:  Soft, Non distended, Non tender. ??+Bowel sounds  Neurologic:?? Alert and oriented X 3, normal speech,   Psych:???? Fair insight.??Not anxious nor agitated    Reviewed most current lab test results and cultures  YES  Reviewed most current radiology test results   YES  Review and summation of old records today    NO   Reviewed patient's current orders and MAR    YES  PMH/SH reviewed - no change compared to H&P  ________________________________________________________________________  Care Plan discussed with:    Comments   Patient y    Family      RN y    Buyer, retail                        Multidiciplinary team rounds were held today with case manager, nursing, pharmacist and Higher education careers adviser.  Patient's plan of care was discussed; medications were reviewed and discharge planning was addressed.     ________________________________________________________________________  Total NON critical care TIME:  20   Minutes    Total CRITICAL CARE TIME Spent:   Minutes non procedure based      Comments   >50% of visit spent in counseling and coordination of care     ________________________________________________________________________  Guerry Bruin, MD     Procedures: see electronic medical records for all procedures/Xrays and details which were not copied into this note but were reviewed prior to creation of Plan.      LABS:  I reviewed today's most current labs and imaging studies.  Pertinent labs include:  No results for input(s): WBC, HGB, HCT, PLT, HGBEXT, HCTEXT, PLTEXT, HGBEXT, HCTEXT, PLTEXT in the last 72 hours.  Recent Labs     11/01/17  0513 10/30/17  0615   NA 137 140   K 4.4 4.7   CL 105 105   CO2 26 29   GLU 127* 113*   BUN 49* 43*   CREA 1.39* 1.16*   CA 8.3* 8.0*   MG  --  2.3   PHOS  --  3.8  ALB  --  3.0*   TBILI  --  1.3*   SGOT  --  12*   ALT  --  11*       Signed: Guerry BruinMaria Noeh Sparacino, MD

## 2017-11-02 ENCOUNTER — Inpatient Hospital Stay: Admit: 2017-11-02 | Payer: MEDICARE | Primary: Internal Medicine

## 2017-11-02 ENCOUNTER — Inpatient Hospital Stay: Payer: MEDICARE | Primary: Internal Medicine

## 2017-11-02 LAB — METABOLIC PANEL, COMPREHENSIVE
A-G Ratio: 1 — ABNORMAL LOW (ref 1.1–2.2)
ALT (SGPT): 149 U/L — ABNORMAL HIGH (ref 12–78)
AST (SGOT): 178 U/L — ABNORMAL HIGH (ref 15–37)
Albumin: 3.3 g/dL — ABNORMAL LOW (ref 3.5–5.0)
Alk. phosphatase: 63 U/L (ref 45–117)
Anion gap: 6 mmol/L (ref 5–15)
BUN/Creatinine ratio: 27 — ABNORMAL HIGH (ref 12–20)
BUN: 53 MG/DL — ABNORMAL HIGH (ref 6–20)
Bilirubin, total: 2.3 MG/DL — ABNORMAL HIGH (ref 0.2–1.0)
CO2: 27 mmol/L (ref 21–32)
Calcium: 8.5 MG/DL (ref 8.5–10.1)
Chloride: 103 mmol/L (ref 97–108)
Creatinine: 1.94 MG/DL — ABNORMAL HIGH (ref 0.55–1.02)
GFR est AA: 31 mL/min/{1.73_m2} — ABNORMAL LOW (ref >60)
GFR est non-AA: 26 mL/min/{1.73_m2} — ABNORMAL LOW (ref >60)
Globulin: 3.3 g/dL (ref 2.0–4.0)
Glucose: 119 mg/dL — ABNORMAL HIGH (ref 65–100)
Potassium: 4.9 mmol/L (ref 3.5–5.1)
Protein, total: 6.6 g/dL (ref 6.4–8.2)
Sodium: 136 mmol/L (ref 136–145)

## 2017-11-02 LAB — GLUCOSE, POC
Glucose (POC): 147 mg/dL — ABNORMAL HIGH (ref 65–100)
Glucose (POC): 115 mg/dL — ABNORMAL HIGH (ref 65–100)
Glucose (POC): 139 mg/dL — ABNORMAL HIGH (ref 65–100)
Glucose (POC): 164 mg/dL — ABNORMAL HIGH (ref 65–100)

## 2017-11-02 LAB — LIPASE: Lipase: 41 U/L — ABNORMAL LOW (ref 73–393)

## 2017-11-02 MED ORDER — SODIUM CHLORIDE 0.9 % INJECTION
5 mg/mL | Freq: Once | INTRAMUSCULAR | Status: AC
Start: 2017-11-02 — End: 2017-11-02
  Administered 2017-11-02: 10:00:00 via INTRAVENOUS

## 2017-11-02 MED ORDER — PROMETHAZINE 25 MG/ML INJECTION
25 mg/mL | Freq: Once | INTRAMUSCULAR | Status: DC
Start: 2017-11-02 — End: 2017-11-02
  Administered 2017-11-02: 10:00:00 via INTRAVENOUS

## 2017-11-02 MED FILL — IPRATROPIUM-ALBUTEROL 2.5 MG-0.5 MG/3 ML NEB SOLUTION: 2.5 mg-0.5 mg/3 ml | RESPIRATORY_TRACT | Qty: 3

## 2017-11-02 MED FILL — INSULIN LISPRO 100 UNIT/ML INJECTION: 100 unit/mL | SUBCUTANEOUS | Qty: 1

## 2017-11-02 MED FILL — PROCHLORPERAZINE EDISYLATE 5 MG/ML INJECTION: 5 mg/mL | INTRAMUSCULAR | Qty: 2

## 2017-11-02 MED FILL — ENOXAPARIN 40 MG/0.4 ML SUB-Q SYRINGE: 40 mg/0.4 mL | SUBCUTANEOUS | Qty: 0.4

## 2017-11-02 MED FILL — ASPIRIN 81 MG TAB, DELAYED RELEASE: 81 mg | ORAL | Qty: 1

## 2017-11-02 MED FILL — BD POSIFLUSH NORMAL SALINE 0.9 % INJECTION SYRINGE: INTRAMUSCULAR | Qty: 20

## 2017-11-02 MED FILL — PRAVASTATIN 10 MG TAB: 10 mg | ORAL | Qty: 2

## 2017-11-02 MED FILL — ONDANSETRON (PF) 4 MG/2 ML INJECTION: 4 mg/2 mL | INTRAMUSCULAR | Qty: 2

## 2017-11-02 MED FILL — DOK PLUS 8.6 MG-50 MG TABLET: ORAL | Qty: 2

## 2017-11-02 MED FILL — MAPAP (ACETAMINOPHEN) 325 MG TABLET: 325 mg | ORAL | Qty: 2

## 2017-11-02 MED FILL — PREDNISONE 20 MG TAB: 20 mg | ORAL | Qty: 1

## 2017-11-02 MED FILL — TOPROL XL 25 MG TABLET,EXTENDED RELEASE: 25 mg | ORAL | Qty: 1

## 2017-11-02 MED FILL — FUROSEMIDE 40 MG TAB: 40 mg | ORAL | Qty: 1

## 2017-11-02 NOTE — Progress Notes (Addendum)
0700 Bedside and Verbal shift change report received from PecosMatt, Charity fundraiserN (offgoing nurse). Report included the following information SBAR, Kardex, Intake/Output, MAR, Accordion and Recent Results.     0800 Assessment complete. See flowsheet for details.    1200 Reassessment unchanged. Will continue to monitor.    1600 Reassessment unchanged. Will continue to monitor.    1900 Bedside and Verbal shift change report given to Rob, Charity fundraiserN (oncoming nurse). Report included the following information SBAR, Kardex, Intake/Output, MAR, Accordion and Recent Results.

## 2017-11-02 NOTE — Progress Notes (Signed)
[facility]  Name: Brittney Shelton MRN: [id number]   DOB: 11/27/1950 [facility]   Date: 11/02/2017        IMPRESSION:   ?? Acute/chronic hypoxic/hypercapnic respiratory failure; on 4 LPm at home for many yrs since [place]; Currently on 50 liters and 90 %  .   ?? Community acquired pneumonia  ?? "COPD exacerbation" - she has a diagnosis of COPD, but she has no obstruction on PFTs; patient of Dr. Jayme CloudGonzalez  ?? Acute renal failure  ?? Severe Pulmonary HTN with dilated RV and RV failure on echo; CTA negative for PE; this did not happen overnight  ?? LVH/diastolic heart failure- cxr yesterday still looked  Wet .   ?? DM/hyperglycemia  ?? Obesity - ?OSA/OHS  ?? Constipation, nausea      PLAN:   ?? CCU   ?? wean HFNC- appears to have needed increased  High flow overnight   ?? On bipap or HFNC  ?? If we can keeps sats high enough next week, will order VQ scan  This week   ?? Eventually needs diagnostic RHC, vasodilator triall but not sure if she will ever become stable enough  ?? If she declines, will need intubation  ?? Incentive spirometer- told pt may take awhile for her to recover and can make no predictions how fast  ?? Bronchodilators   ?? Empiric antibiotics to complete a course  ?? Follow up cultures  ?? Diurese daily  ?? Will eventually need sleep study   ?? Monitor creatinine, trending in the right direction  ?? Insulin   ?? DVT prophyaxis  ?? Slowly advance diet  ?? Miralax , Zofran  ?? Condition remains guarded.  Not yet stable enough to undergo v/q or additional testing out of icu.      Subjective/Interval History:   I have reviewed the flowsheet and previous day???s notes.  6/2 continues to have nausea. States she had a BM yesterday and this morning. No abd bloating or pain. Breathing maybe a little better.  HFNC 25L, 70%    6/1 feels bloated.  Also nauseated and threw up some of her lunch.   No abd pain, but hasn't had a good BM while here.    HFNC has been decreased to ~20L and 70%. O2 sats accetable at rest, but decrease with movement.      5/31 HFNC 45 LPM. Explained pathophysiology of PHTN and RV failure with pt. Had sleep study but "never told results". Explained to her that convetnional HTN meds does not help and may hurt PHTN. Tough problem    5/30 HFNC 45 LPM. OOB the other day. Slept without BIPAP. ROS otherwise negative    5/29 BIPAP then HFNC this AM. sats hovering close to 88%- 90%. No congestion. No fver. I > O since admission. No preipheral edema. CXr reviewed- no effusion    5/28 No acute events overnight.On bipap.   No severe distress    6/3 - was sleeping without any resp distress when I came in. As can best tell was on high flow overnight.     Objective:  Vital Signs:    Visit Vitals  BP 98/61   Pulse 88   Temp 97.7 ??F (36.5 ??C)   Resp 25   Ht 5\' 6"  (1.676 m)   Wt 135 kg (297 lb 9.9 oz)   SpO2 94%   BMI 48.04 kg/m??  O2 Device: Hi flow nasal cannula   O2 Flow Rate (L/min): 50 l/min   Temp (24hrs), Avg:97.6 ??F (36.4 ??C), Min:97.5 ??F (36.4 ??C), Max:97.7 ??F (36.5 ??C)     Intake/Output:   Last shift:      No intake/output data recorded.  Last 3 shifts: 06/01 1901 - 06/03 0700  In: 1262 [P.O.:1262]  Out: 402 [Urine:400]    Intake/Output Summary (Last 24 hours) at 11/02/2017 0758  Last data filed at 11/02/2017 0454  Gross per 24 hour   Intake 1040 ml   Output 302 ml   Net 738 ml     Physical exam   heent - high flow in place  Neck -  nontender   cardiac - rrr , sinus   Lungs- ant /lat coarse   abd - soft and nontender   Ext-   Large with plus one edema   neuro - appears alert and  Oriented    gen - no distress on high flow  Supplemental oxygen.     Data:     Current Facility-Administered Medications   Medication Dose Route Frequency   ??? senna-docusate (PERICOLACE) 8.6-50 mg per tablet 2 Tab  2 Tab Oral BID   ??? albuterol-ipratropium (DUO-NEB) 2.5 MG-0.5 MG/3 ML  3 mL Nebulization Q6H RT    ??? polyethylene glycol (MIRALAX) packet 17 g  17 g Oral DAILY   ??? furosemide (LASIX) tablet 40 mg  40 mg Oral DAILY   ??? predniSONE (DELTASONE) tablet 20 mg  20 mg Oral DAILY WITH BREAKFAST   ??? enoxaparin (LOVENOX) injection 40 mg  40 mg SubCUTAneous Q12H   ??? metoprolol succinate (TOPROL-XL) XL tablet 25 mg  25 mg Oral DAILY   ??? insulin lispro (HUMALOG) injection   SubCUTAneous AC&HS   ??? sodium chloride (NS) flush 5-40 mL  5-40 mL IntraVENous Q8H   ??? aspirin delayed-release tablet 81 mg  81 mg Oral DAILY   ??? pravastatin (PRAVACHOL) tablet 20 mg  20 mg Oral QHS                Labs:  No results for input(s): WBC, HGB, HCT, PLT, HGBEXT, HCTEXT, PLTEXT, HGBEXT, HCTEXT, PLTEXT in the last 72 hours.  Recent Labs     11/01/17  0513   NA 137   K 4.4   CL 105   CO2 26   GLU 127*   BUN 49*   CREA 1.39*   CA 8.3*     No results for input(s): PHI, PCO2I, PO2I, HCO3I, FIO2I in the last 72 hours.  Imaging:  I have personally reviewed the patient???s radiographs and have reviewed the reports:  cxr yesterday looked wet but better than compared to earlier.           Brittney Laroche, MD

## 2017-11-02 NOTE — Other (Signed)
Interdisciplinary team rounds were held 11/02/2017 with the following team members:Care Management, Diabetes Treatment Specialist, Nursing, Nutrition, Pharmacy, Physical Therapy, Physician and Respiratory Therapy.    Plan of care discussed. See clinical pathway and/or care plan for interventions and desired outcomes.

## 2017-11-02 NOTE — Progress Notes (Signed)
Hospitalist Progress Note  NAME: Brittney Shelton   DOB:  [birth year]   MRN:  [id number]       Assessment / Plan:  Nausea/vomiting  Constipation resolved   Elevated LFTs: r/o biliary colic   -vomited again last night and feeling nauseous   KUB 6/2 normal   LFT's is up --> check abd [place] /lipase   -monitor LFTs   -had good BM yesterday   Cont aggressive bowel regiment     Acute on chronic respiratory failure with hypoxia POA  Acute on chronic diastolic HF POA  Cor pulmonale POA due to severe pulmonary hypertension   Right lower lobe pneumonia  COPD exacerbation POA  -clinically:respiratory status not changed at all over last week  remain on HFNC but was able to go down some, desaturating easily with minimal exertion   BIPAP prn   Baseline 4L oxygen requirement  Unclear how log it will take for lungs to recover   -cxray today: Likely mild interstitial edema --> on daily lasix 40 mg PO   Re assess for diureses daily   -pulmonary help was greatly appreciated    -completed Levaquin   -IV steroids --> PO prednisone, tapering  Jet nebs scheduled   ??  Acute kidney injury POA  -last normal Cr 09/2017, admission 1.6 --> stable at 1.4 now   Good UO   Unclear exact etiology  -monitor closely. Avoid nephrotoxic drugs, adjust all meds to GFR.   ??  Essential HTN POA  -BP low side 90/100  -cont BB/lasix   Holding Norvasc/ Imdur   ??  DM type 2 POA  Diet controlled at home. BS acceptable now   Last HgBa1c 6.4 in Dec 2018  Hyperglycemic on steroids; refusing insulin most of the time    ??  Right pleural effusion POA  Small, doubt thoracentesis would contribute much  ??  Hyperkalemia, mild, resolved   Remote tobacco use quit 20 years ago  40 or above Morbid obesity / Body mass index is 48.04 kg/m??.    Code status: Full  Prophylaxis: Hep SQ     Recommended Disposition: remain in ICU      Subjective:     Chief Complaint / Reason for Physician Visit: follow up respiratory failure/ copd   Vomited again last night  Feeling nauseous   Had good BM s/p lactulose     Discussed with RN events overnight.     Review of Systems:  Symptom Y/N Comments  Symptom Y/N Comments   Fever/Chills n   Chest Pain n    Poor Appetite    Edema     Cough    Abdominal Pain n    Sputum    Joint Pain     SOB/DOE    Pruritis/Rash     Nausea/vomit n   Tolerating PT/OT     Diarrhea n   Tolerating Diet     Constipation n   Other       Could NOT obtain due to:      Objective:     VITALS:   Last 24hrs VS reviewed since prior progress note. Most recent are:  Patient Vitals for the past 24 hrs:   Temp Pulse Resp BP SpO2   11/02/17 1000 ??? 92 25 101/55 97 %   11/02/17 0900 ??? 88 22 110/69 94 %   11/02/17 0800 98.4 ??F (36.9 ??C) 86 21 95/67 95 %   11/02/17 0721 ??? ??? ??? ??? 94 %  11/02/17 0701 ??? 88 25 98/61 92 %   11/02/17 0700 ??? 86 24 ??? 93 %   11/02/17 0600 ??? 88 ??? ??? 95 %   11/02/17 0500 ??? 89 20 97/51 92 %   11/02/17 0454 ??? ??? ??? ??? 92 %   11/02/17 0401 97.7 ??F (36.5 ??C) 89 22 96/60 94 %   11/02/17 0300 ??? 95 22 96/64 94 %   11/02/17 0203 ??? 92 22 102/63 92 %   11/02/17 0109 ??? ??? ??? ??? 94 %   11/02/17 0104 ??? 91 20 91/59 91 %   11/02/17 0001 97.5 ??F (36.4 ??C) 92 22 93/60 95 %   11/01/17 2300 ??? 93 23 105/76 92 %   11/01/17 2245 ??? 95 25 ??? 91 %   11/01/17 2230 ??? 97 24 ??? 91 %   11/01/17 2228 ??? 96 24 ??? 91 %   11/01/17 2215 ??? 94 22 ??? 90 %   11/01/17 2214 ??? 94 22 ??? 92 %   11/01/17 2209 ??? 92 18 ??? 94 %   11/01/17 2205 ??? 95 24 ??? 90 %   11/01/17 2200 ??? 95 25 107/71 (!) 88 %   11/01/17 2100 ??? 96 25 108/72 92 %   11/01/17 2000 97.5 ??F (36.4 ??C) 94 22 103/66 91 %   11/01/17 1918 ??? ??? ??? ??? 92 %   11/01/17 1913 ??? ??? ??? ??? 92 %   11/01/17 1900 ??? 93 23 94/64 94 %   11/01/17 1800 ??? 93 25 ??? 92 %   11/01/17 1700 ??? 97 25 114/59 97 %   11/01/17 1631 ??? ??? ??? ??? 91 %   11/01/17 1600 97.5 ??F (36.4 ??C) 100 26 102/62 ???   11/01/17 1500 ??? 99 28 (!) 138/93 ???   11/01/17 1400 ??? 92 25 101/68 (!) 89 %   11/01/17 1359 ??? ??? ??? ??? 92 %   11/01/17 1352 ??? ??? ??? ??? 91 %   11/01/17 1338 ??? 91 25 121/72 92 %   11/01/17 1300 ??? 88 24 ??? 92 %    11/01/17 1200 ??? 89 20 112/69 (!) 88 %   11/01/17 1149 97.7 ??F (36.5 ??C) 88 24 112/74 91 %   11/01/17 1100 ??? 96 20 103/61 92 %       Intake/Output Summary (Last 24 hours) at 11/02/2017 1051  Last data filed at 11/02/2017 0900  Gross per 24 hour   Intake 860 ml   Output 302 ml   Net 558 ml        PHYSICAL EXAM:  General: WD, WN. Alert, cooperative, no acute distress, morbidly obese  EENT:  EOMI. Anicteric sclerae. MMM  Resp:  Exam limited by habitus, no wheezing or rales  CV:  Regular  Rhythm,??+ edema  GI:  Soft, Non distended, Non tender. ??+Bowel sounds  Neurologic:?? Alert and oriented X 3, normal speech,   Psych:???? Fair insight.??Not anxious nor agitated    Reviewed most current lab test results and cultures  YES  Reviewed most current radiology test results   YES  Review and summation of old records today    NO  Reviewed patient's current orders and MAR    YES  PMH/SH reviewed - no change compared to H&P  ________________________________________________________________________  Care Plan discussed with:    Comments   Patient y    Family      RN y    Buyer, retailCare Manager     Consultant  Multidiciplinary team rounds were held today with case manager, nursing, pharmacist and clinical coordinator.  Patient's plan of care was discussed; medications were reviewed and discharge planning was addressed.     ________________________________________________________________________  Total NON critical care TIME:  35  Minutes    Total CRITICAL CARE TIME Spent:   Minutes non procedure based      Comments   >50% of visit spent in counseling and coordination of care     ________________________________________________________________________  Guerry Bruin, MD     Procedures: see electronic medical records for all procedures/Xrays and details which were not copied into this note but were reviewed prior to creation of Plan.      LABS:  I reviewed today's most current labs and imaging studies.  Pertinent labs include:   No results for input(s): WBC, HGB, HCT, PLT, HGBEXT, HCTEXT, PLTEXT, HGBEXT, HCTEXT, PLTEXT in the last 72 hours.  Recent Labs     11/02/17  0844 11/01/17  0513   NA 136 137   K 4.9 4.4   CL 103 105   CO2 27 26   GLU 119* 127*   BUN 53* 49*   CREA 1.94* 1.39*   CA 8.5 8.3*   ALB 3.3*  --    TBILI 2.3*  --    SGOT 178*  --    ALT 149*  --        Signed: Guerry Bruin, MD

## 2017-11-02 NOTE — Progress Notes (Signed)
Problem: Self Care Deficits Care Plan (Adult)  Goal: *Acute Goals and Plan of Care (Insert Text)  Description  Occupational Therapy Goals  Initiated 10/27/2017  1.  Patient will perform grooming tasks standing at the sink with modified independence within 7 day(s).  2.  Patient will perform lower body dressing with modified independence within 7 day(s).  3.  Patient will perform toilet transfers with modified independence using least restrictive device within 7 day(s).  4.  Patient will perform all aspects of toileting with modified independence within 7 day(s).  5.  Patient will perform safe item retrieval in preparation for ADL/self-care tasks with Mod I (while managing O2 extension cord in small functional spaces) and using least restrictive device within 7 day(s).   6.  Patient will utilize activity pacing and energy conservation techniques during functional activities without verbal cues within 7 day(s).          Outcome: Progressing Towards Goal   OCCUPATIONAL THERAPY TREATMENT  Patient: Brittney Shelton [phone](67 y.o. female)  Date: 11/02/2017  Diagnosis: Acute respiratory failure with hypoxemia (HCC) [J96.01] <principal problem not specified>       Precautions: Fall  Chart, occupational therapy assessment, plan of care, and goals were reviewed.    ASSESSMENT:  Cleared for therapy by RN and Pt agreeable to participate with education on importance of OOB functional activity performance. Received sitting chair on arrival. Pt verbalized she felt very nauseas and vomited overnight, and that she hadn't brushed her teeth since that timeframe. Pt was agreeable to brush her teeth standing at elevated tray table, requiring CGA for sit<>stand transfer and Supervision with task in standing. Pt educated on benefits to taking RBs PRN while brushing teeth to increase her endurance with good return demonstration. O2 sats decreased from upper 90s to 90% following brushing her teeth with Pt  visibly SOB. Guided Pt through PLB techniques seated in chair via verbal/visual cueing d/t observed shallow breathing. [facility]     Progression toward goals:  ?       Improving appropriately and progressing toward goals  ?       Improving slowly and progressing toward goals  ?       Not making progress toward goals and plan of care will be adjusted     PLAN:  Patient continues to benefit from skilled intervention to address the above impairments.  Continue treatment per established plan of care.  Discharge Recommendations:  Home Health OT  Further Equipment Recommendations for Discharge:  Long handled bathing/dressing equipment      SUBJECTIVE:   Patient stated ?I was throwing up a lot"    OBJECTIVE DATA SUMMARY:   Functional Mobility and Transfers for ADLs:  Bed Mobility:  Scooting: Modified independent    Transfers:  Sit to Stand: Contact guard assistance     Balance:  Sitting: Intact  Standing: Impaired;With support  Standing - Static: Good;Constant support  Standing - Dynamic : Fair;Constant support  ADL Intervention:     Grooming  Brushing Teeth: Set-up;Supervision(standing at elevated tray table)  Cues: Verbal cues provided(for RBs PRN d/t fatigue)    Lower Body Dressing Assistance  Shoes with Elastic Laces: Set-up    Therapeutic Exercises:   Guided Pt through performance of UE AROM exercises (flexion/extension) at all joints (shoulder/elbow/wrist/digits) to maximize joint ROM for ADL task performance and to decrease edema in B hands. Educated Pt on importance of performing exercises as tolerated throughout the day while seated given  limitations in mobility with HFNC machine.     Activity Tolerance:   Continues to be most limited by respiratory status and increased dependence on HFNC. Pt fatigued and SOB following brushing her teeth standing at elevated tray table.      Please refer to the flowsheet for vital signs taken during this treatment.  After treatment:    ? Patient left in no apparent distress sitting up in chair  ? Patient left in no apparent distress in bed  ? Call bell left within reach  ? Nursing notified  ? Caregiver present  ? Bed alarm activated    COMMUNICATION/COLLABORATION:   The patient?s plan of care was discussed with: Registered Nurse and Patient     Benay Pillow, OTR/L  Time Calculation: 28 mins

## 2017-11-02 NOTE — Progress Notes (Signed)
Chart reviewed. Attempted to see pt for PT intervention however pt politely declining, reporting increased fatigue this date. Pt requesting she be allowed to rest and therapy return tomorrow. Pt has been mobilizing to bedside chair and bedside commode multiple times a day with supervision/SBA of RN. Will defer per pt request however continue to follow. Thank you    Demetrio LappingMargaret M Bryan, PT, DPT

## 2017-11-02 NOTE — Progress Notes (Signed)
Telehospitalist: Phenergan for intractable nausea and vomiting, not relieved by Zofran

## 2017-11-03 LAB — GLUCOSE, POC
Glucose (POC): 159 mg/dL — ABNORMAL HIGH (ref 65–100)
Glucose (POC): 112 mg/dL — ABNORMAL HIGH (ref 65–100)
Glucose (POC): 125 mg/dL — ABNORMAL HIGH (ref 65–100)
Glucose (POC): 151 mg/dL — ABNORMAL HIGH (ref 65–100)

## 2017-11-03 LAB — CBC W/O DIFF
ABSOLUTE NRBC: 0 10*3/uL (ref 0.00–0.01)
HCT: 39.8 % (ref 35.0–47.0)
HGB: 12.9 g/dL (ref 11.5–16.0)
MCH: 27.5 PG (ref 26.0–34.0)
MCHC: 32.4 g/dL (ref 30.0–36.5)
MCV: 84.9 FL (ref 80.0–99.0)
NRBC: 0 PER 100 WBC
PLATELET: 109 10*3/uL — ABNORMAL LOW (ref 150–400)
RBC: 4.69 M/uL (ref 3.80–5.20)
RDW: 19.4 % — ABNORMAL HIGH (ref 11.5–14.5)
WBC: 11.3 10*3/uL — ABNORMAL HIGH (ref 3.6–11.0)

## 2017-11-03 LAB — METABOLIC PANEL, BASIC
Anion gap: 8 mmol/L (ref 5–15)
BUN/Creatinine ratio: 30 — ABNORMAL HIGH (ref 12–20)
BUN: 65 MG/DL — ABNORMAL HIGH (ref 6–20)
CO2: 27 mmol/L (ref 21–32)
Calcium: 8.4 MG/DL — ABNORMAL LOW (ref 8.5–10.1)
Chloride: 102 mmol/L (ref 97–108)
Creatinine: 2.14 MG/DL — ABNORMAL HIGH (ref 0.55–1.02)
GFR est AA: 28 mL/min/{1.73_m2} — ABNORMAL LOW (ref >60)
GFR est non-AA: 23 mL/min/{1.73_m2} — ABNORMAL LOW (ref >60)
Glucose: 115 mg/dL — ABNORMAL HIGH (ref 65–100)
Potassium: 4.8 mmol/L (ref 3.5–5.1)
Sodium: 137 mmol/L (ref 136–145)

## 2017-11-03 LAB — ALT: ALT (SGPT): 246 U/L — ABNORMAL HIGH (ref 12–78)

## 2017-11-03 LAB — BILIRUBIN, TOTAL: Bilirubin, total: 2.2 MG/DL — ABNORMAL HIGH (ref 0.2–1.0)

## 2017-11-03 LAB — MAGNESIUM: Magnesium: 2.5 mg/dL — ABNORMAL HIGH (ref 1.6–2.4)

## 2017-11-03 LAB — PHOSPHORUS: Phosphorus: 5.3 MG/DL — ABNORMAL HIGH (ref 2.6–4.7)

## 2017-11-03 LAB — AST: AST (SGOT): 200 U/L — ABNORMAL HIGH (ref 15–37)

## 2017-11-03 MED ORDER — PREDNISONE 5 MG TAB
5 mg | Freq: Every day | ORAL | Status: DC
Start: 2017-11-03 — End: 2017-11-10
  Administered 2017-11-03 – 2017-11-10 (×8): via ORAL

## 2017-11-03 MED ORDER — FUROSEMIDE 40 MG TAB
40 mg | Freq: Every day | ORAL | Status: DC
Start: 2017-11-03 — End: 2017-11-04
  Administered 2017-11-03: 13:00:00 via ORAL

## 2017-11-03 MED ORDER — ISOSORBIDE DINITRATE 10 MG TAB
10 mg | ORAL_TABLET | ORAL | 0 refills | Status: AC
Start: 2017-11-03 — End: ?

## 2017-11-03 MED ORDER — IPRATROPIUM-ALBUTEROL 2.5 MG-0.5 MG/3 ML NEB SOLUTION
2.5 mg-0.5 mg/3 ml | Freq: Four times a day (QID) | RESPIRATORY_TRACT | Status: DC
Start: 2017-11-03 — End: 2017-11-07
  Administered 2017-11-03 – 2017-11-07 (×17): via RESPIRATORY_TRACT

## 2017-11-03 MED FILL — ENOXAPARIN 40 MG/0.4 ML SUB-Q SYRINGE: 40 mg/0.4 mL | SUBCUTANEOUS | Qty: 0.4

## 2017-11-03 MED FILL — INSULIN LISPRO 100 UNIT/ML INJECTION: 100 unit/mL | SUBCUTANEOUS | Qty: 1

## 2017-11-03 MED FILL — IPRATROPIUM-ALBUTEROL 2.5 MG-0.5 MG/3 ML NEB SOLUTION: 2.5 mg-0.5 mg/3 ml | RESPIRATORY_TRACT | Qty: 3

## 2017-11-03 MED FILL — MAPAP (ACETAMINOPHEN) 325 MG TABLET: 325 mg | ORAL | Qty: 2

## 2017-11-03 MED FILL — FUROSEMIDE 40 MG TAB: 40 mg | ORAL | Qty: 1

## 2017-11-03 MED FILL — BD POSIFLUSH NORMAL SALINE 0.9 % INJECTION SYRINGE: INTRAMUSCULAR | Qty: 10

## 2017-11-03 MED FILL — ASPIRIN 81 MG TAB, DELAYED RELEASE: 81 mg | ORAL | Qty: 1

## 2017-11-03 MED FILL — ONDANSETRON (PF) 4 MG/2 ML INJECTION: 4 mg/2 mL | INTRAMUSCULAR | Qty: 2

## 2017-11-03 MED FILL — PREDNISONE 5 MG TAB: 5 mg | ORAL | Qty: 2

## 2017-11-03 MED FILL — PRAVASTATIN 10 MG TAB: 10 mg | ORAL | Qty: 2

## 2017-11-03 NOTE — Progress Notes (Signed)
Interdisciplinary team rounds were held   11/03/2017 with the following team members:Care Management, Diabetes Treatment Specialist, Nursing, Nutrition, Pharmacy, Physical Therapy, Physician and Respiratory Therapy.     Plan of care discussed. Goal: See MD orders and progress notes for further  interventions and desired outcomes.

## 2017-11-03 NOTE — Progress Notes (Signed)
Problem: Mobility Impaired (Adult and Pediatric)  Goal: *Acute Goals and Plan of Care (Insert Text)  Description  Physical Therapy Goals  Initiated 10/27/2017  Goals reviewed 11/03/2017 and remain appropriate for another 7 days.  1.  Patient will move from supine to sit and sit to supine  in bed with modified independence within 7 day(s).    2.  Patient will transfer from bed to chair and chair to bed with modified independence using the least restrictive device within 7 day(s).  3.  Patient will perform sit to stand with modified independence within 7 day(s).  4.  Patient will ambulate with modified independence for 50 feet with the least restrictive device within 7 day(s).   5.  Patient will ascend/descend 4 stairs with bilateral handrail(s) with supervision/set-up within 7 day(s).      Outcome: Progressing Towards GoalOutcome: Progressing Towards Goal   PHYSICAL THERAPY TREATMENT: WEEKLY REASSESSMENT  Patient: Brittney Shelton [phone](67 y.o. female)  Date: 11/03/2017  Diagnosis: Acute respiratory failure with hypoxemia (HCC) [J96.01] <principal problem not specified>       Precautions: Fall  Chart, physical therapy assessment, plan of care and goals were reviewed.    ASSESSMENT:  Pt cleared by RN for PT tx session.  Pt received lying in bed, agreeable to PT session.  Noted that pt is on 50L of [place] O2.  Pt demonstrates bed mobility and sup>sit at SBA.  Pt able to stand from bed to RW with SBA.  Pt reported a need to urinate and was able to walk 2 ft with RW and SBA to bedside commode.  Pt with slow shuffling steps. O2 sats decreased to 90% with ambulation, but otherwise remained in the upper 90s. Pt ambulated another 2 ft from bedside commode to recliner with SBA. Pt's mobility continues to improve, however pt remains limited by compromised respiratory status.  Recommend HHPT at discharge to further address deficits and assist pt in returning to her baseline.  Patient's progression toward goals since last assessment:       PLAN:  Goals have been updated based on progression since last assessment.  Patient continues to benefit from skilled intervention to address the above impairments.  Continue to follow the patient 4 times a week to address goals.  Planned Interventions:  ?              Bed Mobility Training             ?       Neuromuscular Re-Education  ?              Transfer Training                   ?       Orthotic/Prosthetic Training  ?              Gait Training                         ?       Modalities  ?              Therapeutic Exercises           ?       Edema Management/Control  ?              Therapeutic Activities            ?       Patient and Family Training/Education  ?  Other (comment):  Discharge Recommendations: Home Health  Further Equipment Recommendations for Discharge:  none     SUBJECTIVE:   Patient stated ?get my shoes so I can run out of here."    OBJECTIVE DATA SUMMARY:   Critical Behavior:  Neurologic State: Alert  Orientation Level: Oriented X4  Cognition: Follows commands     Strength:   Strength: Generally decreased, functional         Functional Mobility Training:  Bed Mobility:  Rolling: Modified independent  Supine to Sit: Modified independent     Scooting: Modified independent        Transfers:  Sit to Stand: Stand-by assistance  Stand to Sit: Supervision        Balance:  Sitting: Intact  Standing: Impaired;Without support  Standing - Static: Good;Constant support  Standing - Dynamic : Fair;Constant support  Ambulation/Gait Training:  Distance (ft): 4 Feet (ft)(35ft x 2)  Assistive Device: Walker, rolling  Ambulation - Level of Assistance: Stand-by assistance        Gait Abnormalities: Shuffling gait        Base of Support: Widened     Speed/Cadence: Shuffled  Step Length: Left shortened;Right shortened   Pain:  Pain Scale 1: Numeric (0 - 10)  Pain Intensity 1: 0              Activity Tolerance:   Fair, O2 sats decreased to 90% after 70ft of gait with RW   Please refer to the flowsheet for vital signs taken during this treatment.  After treatment:   ?  Patient left in no apparent distress sitting up in chair  ?  Patient left in no apparent distress in bed  ?  Call bell left within reach  ?  Nursing notified  ?  Caregiver present  ?  Bed alarm activated    COMMUNICATION/COLLABORATION:   The patient?s plan of care was discussed with: Registered Nurse    Alva Garnet, PT   Time Calculation: 24 mins

## 2017-11-03 NOTE — Progress Notes (Addendum)
Care Management:    TOC PLAN:  DC home with HH Nursing/PT/OT/aide when medically stable pending titration of existing home 02.  HF Bundle / HUG program.  Follow up with Dr Jayme CloudGonzalez and PCP.     Remains in ICU requiring hi flow 02 , 50L at 80%. COPD and CHF.   PT working with patient and recommending HH. [facility].     [facility]   Lives with her brother.     Clydie BraunKaren Rishcoff RN ACM [phone]    Addendum[facility]

## 2017-11-03 NOTE — Progress Notes (Signed)
Nutrition Assessment:    INTERVENTIONS/RECOMMENDATIONS:   Meals/Snacks: General/healthful diet:Contiue current diet   Supplements: Commercial supplement: Ensure clear BID       ASSESSMENT:   Chart reviewed, visited patient at bedside.Patient claimed to have a poor appetite, she said she was unsure what caused it but she just doesn't feel like eating. Discussed adding supplements to her diet, patient said she would try to consume the supplement and requested mixed berry flavor . Added Ensure clear mixed berry BID.Paient said she was familiar with the room service menu, encouraged her to continue ordering foods that interest her. Will continue to monitor electrolytes(phos 5.5,Mg 2.5)  and adjust supplements as necessary.      Diet Order: Consistent carb, Other (comment)(Low K )  % Eaten:    Patient Vitals for the past 72 hrs:   % Diet Eaten   11/01/17 0826 5 %   10/31/17 1845 5 %     Pertinent Medications: _0  Reviewed _1 Other:   Pertinent Labs: _2 Reviewed  _3 Other: phos- 5.3, Mg - 2.5   Food Allergies: _4 None _5 Other:   Last BM:6/3    _6 Active     _7 Hyperactive  _8 Hypoactive       _9  Absent  BS  Skin:    _10  Intact   _11  Incision  _12  Breakdown   _13 Edema   _14 Other:  Anthropometrics: Height: _15  (167.6 cm) Weight: 135 kg (297 lb 9.9 oz)    IBW (%IBW):   ( ) UBW (%UBW):   (  %)    BMI: Body mass index is 48.04 kg/m??.    This BMI is indicative of:  _16 Underweight   _17 Normal   _18 Overweight   _19  Obesity   _20  Extreme Obesity (BMI>40)  Last Weight Metrics:  Weight Loss Metrics 11/01/2017 10/29/2017 09/11/2017 07/30/2017 06/19/2017 05/29/2017 05/28/2017   Today's Wt 297 lb 9.9 oz - 262 lb 11.2 oz 261 lb 258 lb 259 lb 259 lb 8.4 oz   BMI - 48.04 kg/m2 42.4 kg/m2 42.13 kg/m2 41.64 kg/m2 41.8 kg/m2 41.89 kg/m2       Estimated Nutrition Needs (Based on): 2058 Kcals/day(MSJ 1871 x 1.1) , 88 g(1.5gPro/kg IBW ) Protein  Carbohydrate: At Least 130 g/day  Fluids: 2050 mL/day    NUTRITION DIAGNOSES:    Problem:  Inadequate protein-energy intake      Etiology: related to Poor appetite      Signs/Symptoms: as evidenced by PO intake <25 % of meals     Previous Nutrition Dx:  _21  Resolved  _22  Unresolved           _23  Progressing    NUTRITION INTERVENTIONS:  Meals/Snacks: General/healthful diet                  GOAL:   PO intake > 70% of meals /snacks next 3-5 days     NUTRITION MONITORING AND EVALUATION    Food/Nutrient Intake Outcomes: Total energy intake  Physical Signs/Symptoms Outcomes: Electrolyte and renal profile, GI profile, Weight/weight change, Glucose profile, GI    Previous Goal Met:   _24  Met              _25  Progressing Towards Goal              _26  Not Progressing Towards Goal   Previous Recommendations:   _27  Implemented          _28  Not Implemented          <[id number]>_3<\/[id number]>_63  Not Applicable    LEARNING NEEDS (Diet, Food/Nutrient-Drug  Interaction):    _0  None Identified   _1  Identified and Education Provided/Documented   _2  Identified and Pt declined/was not appropriate     Cultural, Religious, OR Ethnic Dietary Needs:    _3  None Identified   _4  Identified and Addressed     _5  Interdisciplinary Care Plan Reviewed/Documented    _6  Discharge Planning: Consistent carb diet with supplements as tolerated    _7  Participated in Interdisciplinary Rounds    NUTRITION RISK:    _8  Patient At Nutritional Risk   _9  Patient Not At Nutritional Risk      Demetrio Lapping  Pager [phone]  Weekend Pager [phone]

## 2017-11-03 NOTE — Progress Notes (Addendum)
0700 Bedside and Verbal shift change report received from Rob, Museum/gallery conservatorN (offgoing nurse). Report included the following information SBAR, Kardex, Intake/Output, MAR, Accordion and Recent Results.     0800 Assessment complete. See flowsheet for details.    1200 Reassessment unchanged. Will continue to monitor.    1600 Reassessment unchanged. Will continue to monitor.    1900 Bedside and Verbal shift change report given to Darl PikesSusan, Charity fundraiserN (oncoming nurse). Report included the following information SBAR, Kardex, Intake/Output, MAR, Accordion and Recent Results.

## 2017-11-03 NOTE — Progress Notes (Addendum)
Problem: Self Care Deficits Care Plan (Adult)  Goal: *Acute Goals and Plan of Care (Insert Text)  Description  Occupational Therapy Goals  Initiated 10/27/2017; Reevaluated on 11/03/2017 for Weekly Reassessment  1.  Patient will perform grooming tasks standing with modified independence within 7 day(s). MODIFIED  2.  Patient will perform lower body dressing with modified independence within 7 day(s). CONTINUE  3.  Patient will perform toilet transfers with modified independence using least restrictive device within 7 day(s). CONTINUE  4.  Patient will perform all aspects of toileting with modified independence within 7 day(s). CONTINUE  5.  Patient will perform safe item retrieval in preparation for ADL/self-care tasks with Mod I (while managing O2 extension cord in small functional spaces) and using least restrictive device within 7 day(s). CONTINUE  6.  Patient will utilize activity pacing and energy conservation techniques during functional activities without verbal cues within 7 day(s). CONTINUE    Outcome: Progressing Towards Goal   OCCUPATIONAL THERAPY 7 DAY WEEKLY REASSESSMENT  Patient: Brittney Shelton [phone] y.o. female)  Date: 11/03/2017  Diagnosis: Acute respiratory failure with hypoxemia (HCC) [J96.01] <principal problem not specified>       Precautions: Fall  Chart, occupational therapy assessment, plan of care, and goals were reviewed.    ASSESSMENT:  Pt completed UB/LB bathing while seated in chair with intermittent RBs to maintain O2 sats above 90% on 50 L/min O2 via HFNC. O2 sats decreased to mid 80s x3 during LB dressing with 1-2 minutes to recover with PLB techniques. Educated Pt on importance of activity pacing strategies during bathing and challenged Pt to self-monitor need for RBs when she is feeling SOB vs pushing through ADLs and waiting to initiate PLB techniques once she feels SOB. Educated Pt on importance of performing ADLs seated vs standing to decrease overall demand on her oxygen. Overall Pt is  physically performing UB/LB ADLs close to her baseline level, yet requires increased O2 supplementation and continues to desat into mid-upper 80s on 50 L/min O2 via HFNC with LB dressing and standing ADL tasks at elevated tray table. Unable to fully assess mobility and endurance d/t Pts poor respiratory status and given mobility limitations with HFNC machine.      Decreasing frequency of OT to 3x per week.     Progression toward goals:  ?       Improving appropriately and progressing toward goals  ?       Improving slowly and progressing toward goals  ?       Not making progress toward goals and plan of care will be adjusted     PLAN:  Patient continues to benefit from skilled intervention to address the above impairments.  Continue treatment per established plan of care.  Discharge Recommendations:  Home Health OT  Further Equipment Recommendations for Discharge:  Long handled bathing/dressing equipment     SUBJECTIVE:   Patient stated ???I just don't know how I'm doing"    OBJECTIVE DATA SUMMARY:   Functional Mobility and Transfers for ADLs:  Transfers:  Sit to Stand: Stand-by assistance     Balance:  Sitting: Intact  Standing: Impaired;Without support  Standing - Static: Good;Constant support  Standing - Dynamic : Fair;Constant support  ADL Intervention:  Upper Body Bathing  Bathing Assistance: Minimum assistance(with wiping upper back)  Position Performed: Seated in chair  Cues: Verbal cues provided(for activity pacing and RBs d/t O2 desats)    Lower Body Bathing  Lower Body : Minimum assistance(assist with wiping feet;  able to reach B ankles)  Position Performed: Seated in chair    Upper Body Dressing Assistance  Hospital Gown: Minimum  assistance(doffed gown with SBA; donned with Min A for neck tie)    Lower Body Dressing Assistance  Shoes with Elastic Laces: Set-up    Activity Tolerance:   See assessment above.    Please refer to the flowsheet for vital signs taken during this treatment.  After treatment:    ? Patient left in no apparent distress sitting up in chair  ? Patient left in no apparent distress in bed  ? Call bell left within reach  ? Nursing notified  ? Caregiver present  ? Bed alarm activated    COMMUNICATION/COLLABORATION:   The patient???s plan of care was discussed with: Registered Nurse and Patient     Benay PillowWhitney Cherry, OTR/L  Time Calculation: 22 mins

## 2017-11-03 NOTE — Consults (Signed)
[facility]  [address]  [place], [place]              GI CONSULTATION NOTE   Santa Genera, [phone] NP in-hospital cell phone M-F until 4:30  After 5pm or on weekends, please call operator for physician on call    NAME: Brittney Shelton   DOB:  December 27, 1950   MRN:  [id number]   Attending: Dr. Edd Arbour  PCP: Dr. Leonor Liv  Primary GI: Dr. Gardenia Phlegm    Date/Time:  11/03/2017 3:39 PM  Assessment:   -Elevated LFTs  ?? WNL on admission, have spiked ALT/AST 246/200, Tbili 2.2  ?? Abdominal ultrasound showing hepatomegaly, mildly dilated portal vein with slow flow, biliary sludge with mild gallbladder wall thickening.  ?? Just completed levaquin for RLL pneumonia  ?? Likely result of respiratory and cardiac status, should start to downtrend with continued medical management  -Thrombocytopenia, 109 today  ?? HIT ab pending, SRA pending  -AKI  ?? BUN 65, creat 2.14, eGFR 28  -Constipation, managed by primary team, on miralax daily and pericolace BID  -Respiratory failure  -COPD  -Pulmonary HTN  -CHF  Plan:   -No plan for endoscopic intervention at this time.  -Elevated LFTs likely related to pulmonary/cardiac status/exacerbations. Likely LFTs will downtrend with medical management. No h/o liver disease in past.  -Avoid hepatotoxic agents  -Supportive measures as clinically indicated.   -Daily LFTs for trending  -Continue with constipation regimen  -Will continue to follow  *Plan discussed with Dr. Gardenia Phlegm  Subjective:     HISTORY OF PRESENT ILLNESS:     Brittney Shelton is an 67 y.o. African American female who we are asked to see for complaint of elevated LFTs. Patient presented to the ER with trouble breathing. Reports was having RLQ abdominal pain intermittently, but not often, reports has resolved since admission. Reports did have some nausea and vomiting. Denies hematemesis, thinks did have some dark vomitus, denies it looking like coffee ground emesis. Denies any pain or trouble  swallowing. Denies any h/o of acid reflux. No NSAIDs, tylenol only as needed for arthritis. Baseline bowel habits are regular with minimal straining. Had some constipation during this admission. However reports has had two good bowel movements Sunday and yesterday. Denies any hematochezia or melena. Had colonoscopy about 10 years ago that was normal. Never had EGD. Mother had uterine cancer. No family history of any GI illnesses/cancers. Has SOB. No CP or lightheadedness/dizziness. Does have decreased appetite. Never told had any form of liver disease. Never had any yellowing of skin or eyes. No intense itching. No h/o of any issues with gallbladder or pancreas as well.      Past Medical History:   Diagnosis Date   ??? Chronic obstructive pulmonary disease ([place])    ??? Congestive heart failure ([place])    ??? Diabetes ([place])    ??? Hypertension       History reviewed. No pertinent surgical history.  Social History     Tobacco Use   ??? Smoking status: Never Smoker   ??? Smokeless tobacco: Never Used   Substance Use Topics   ??? Alcohol use: No     Alcohol/week: 0.0 oz      Family History   Problem Relation Age of Onset   ??? Diabetes Mother    ??? Arthritis-osteo Father    ??? COPD Sister    ??? Diabetes Brother    ??? Diabetes Brother  Allergies   Allergen Reactions   ??? Lisinopril Angioedema      Prior to Admission medications    Medication Sig Start Date End Date Taking? Authorizing Provider   amLODIPine (NORVASC) 10 mg tablet Take 10 mg by mouth daily.   Yes Provider, Historical   furosemide (LASIX) 40 mg tablet Take 40 mg by mouth daily.   Yes Provider, Historical   furosemide (LASIX) 80 mg tablet Take 80 mg by mouth daily.   Yes Provider, Historical   acetaminophen (TYLENOL) 500 mg tablet Take 500 mg by mouth every six (6) hours as needed for Pain.   Yes Provider, Historical   cholecalciferol (VITAMIN D3) 1,000 unit tablet Take 1 Tab by mouth daily. 08/18/17  Yes Domah, Carlye Grippe, MD    metoprolol succinate (TOPROL-XL) 50 mg XL tablet TAKE 1 AND 1/2 TABLET BY MOUTH EVERY DAY 08/11/17  Yes Domah, Nau D, MD   diclofenac (VOLTAREN) 1 % gel APPLY 4 GRAMS EXTERNALLY TO THE AFFECTED AREA THREE TIMES DAILY APPLY TO LOWER BACK AS WELL 07/30/17  Yes Domah, Nau D, MD   spironolactone (ALDACTONE) 50 mg tablet Take 1 Tab by mouth daily. 05/01/17  Yes Domah, Carlye Grippe, MD   pravastatin (PRAVACHOL) 20 mg tablet Take 1 Tab by mouth nightly. 05/01/17  Yes Domah, Carlye Grippe, MD   aspirin delayed-release 81 mg tablet Take 1 Tab by mouth daily. 05/01/17  Yes Domah, Nau D, MD   albuterol-ipratropium (DUO-NEB) 2.5 mg-0.5 mg/3 ml nebu 3 mL by Nebulization route every six (6) hours as needed. COPD  J 44.9 04/03/17  Yes Mencias, Leandro Reasoner, MD   isosorbide dinitrate (ISORDIL) 10 mg tablet TAKE 3 TABLETS BY MOUTH THREE TIMES DAILY 11/03/17   Domah, Carlye Grippe, MD   potassium chloride SR (KLOR-CON 10) 10 mEq tablet TAKE 1 TABLET BY MOUTH TWICE DAILY 10/25/17   Ermalene Postin, MD       Patient Active Problem List   Diagnosis Code   ??? Well controlled type 2 diabetes mellitus ([place]) E11.9   ??? Hypercholesterolemia E78.00   ??? 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 COPD (chronic obstructive pulmonary disease) (HCC) J44.9   ??? Obesity E66.9   ??? Requires continuous at home supplemental oxygen Z99.81   ??? Dependence on supplemental oxygen Z99.81   ??? ACP (advance care planning) Z71.89   ??? Counseling regarding advanced care planning and goals of care Z71.89   ??? Dyspnea R06.00   ??? Acute on chronic diastolic (congestive) heart failure (HCC) I50.33   ??? Obesity, morbid (HCC) E66.01   ??? 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 Intractable back pain M54.9   ??? Ambulatory dysfunction R26.2   ??? 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 Angioedema T78.3XXA   ??? Acute respiratory failure with hypoxia (HCC) J96.01   ??? Acute respiratory failure with hypoxemia (HCC) J96.01    ??? Cor pulmonale (HCC) I27.81   ??? Pulmonary hypertension (HCC) I27.20       REVIEW OF SYSTEMS:    Constitutional: negative fever, negative chills, negative weight loss  Eyes:   negative visual changes  ENT:   negative sore throat, tongue or lip swelling   Respiratory:  negative cough, + dyspnea  Cards:  negative for chest pain, palpitations, lower extremity edema  GI:   See HPI  GU:  negative for frequency, dysuria  Integument:  negative for rash  and pruritus  Heme:  negative for easy bruising and gum/nose bleeding  Musculoskel: negative for myalgias,  back pain and muscle weakness  Neuro: negative for headaches, dizziness, vertigo  Psych: negative for feelings of anxiety, depression     Pertinent Positives: constipation, abdominal pain      Objective:   VITALS:    Visit Vitals  BP 119/72   Pulse 90   Temp 98.1 ??F (36.7 ??C)   Resp 24   Ht 5' 6" (1.676 m)   Wt 135 kg (297 lb 9.9 oz)   SpO2 92%   BMI 48.04 kg/m??       PHYSICAL EXAM:   General:          Alert, WD, WN, cooperative, no distress, appears stated age.    Head:               Normocephalic, without obvious abnormality, atraumatic.  Eyes:               Conjunctivae clear and pale, anicteric sclerae.  Pupils are equal  Nose:               Nares normal. No drainage or sinus tenderness.  Throat:             Lips, mucosa, and tongue normal.  No Thrush  Neck:               Supple, symmetrical,  no adenopathy, thyroid: non tender  Back:               Symmetric,  No CVA tenderness.  Lungs:             CTA bilaterally, decreased lung sounds.  No wheezing/rhonchi/rales.  Chest wall:      No tenderness or deformity. No Accessory muscle use.  Heart:              Regular rate and rhythm,  no murmur, rub or gallop.  Abdomen:        Soft, non-tender. Mildly distended.  Bowel sounds normal. No masses  Extremities:     Atraumatic, No cyanosis.  No edema. No clubbing  Skin:                Texture, turgor normal. No rashes/lesions/jaundice   Lymph:            Cervical, supraclavicular normal.  Psych:             Good insight.  Not depressed.  Not anxious or agitated.  Neurologic:      EOMs intact. No facial asymmetry. No aphasia or slurred speech. Normal                        strength, A/O X 3.     LAB DATA REVIEWED:    Recent Results (from the past 24 hour(s))   GLUCOSE, POC    Collection Time: 11/02/17  5:22 PM   Result Value Ref Range    Glucose (POC) 164 (H) 65 - 100 mg/dL    Performed by Noel Christmas    GLUCOSE, POC    Collection Time: 11/02/17 10:52 PM   Result Value Ref Range    Glucose (POC) 159 (H) 65 - 100 mg/dL    Performed by Guido Sander    CBC W/O DIFF    Collection Time: 11/03/17  5:17 AM   Result Value Ref Range    WBC 11.3 (H) 3.6 - 11.0 K/uL  RBC 4.69 3.80 - 5.20 M/uL    HGB 12.9 11.5 - 16.0 g/dL    HCT 39.8 35.0 - 47.0 %    MCV 84.9 80.0 - 99.0 FL    MCH 27.5 26.0 - 34.0 PG    MCHC 32.4 30.0 - 36.5 g/dL    RDW 19.4 (H) 11.5 - 14.5 %    PLATELET 109 (L) 150 - 400 K/uL    NRBC 0.0 0 PER 100 WBC    ABSOLUTE NRBC 0.00 0.00 - 4.49 K/uL   METABOLIC PANEL, BASIC    Collection Time: 11/03/17  6:52 AM   Result Value Ref Range    Sodium 137 136 - 145 mmol/L    Potassium 4.8 3.5 - 5.1 mmol/L    Chloride 102 97 - 108 mmol/L    CO2 27 21 - 32 mmol/L    Anion gap 8 5 - 15 mmol/L    Glucose 115 (H) 65 - 100 mg/dL    BUN 65 (H) 6 - 20 MG/DL    Creatinine 2.14 (H) 0.55 - 1.02 MG/DL    BUN/Creatinine ratio 30 (H) 12 - 20      GFR est AA 28 (L) >60 ml/min/1.68m    GFR est non-AA 23 (L) >60 ml/min/1.731m   Calcium 8.4 (L) 8.5 - 10.1 MG/DL   MAGNESIUM    Collection Time: 11/03/17  6:52 AM   Result Value Ref Range    Magnesium 2.5 (H) 1.6 - 2.4 mg/dL   PHOSPHORUS    Collection Time: 11/03/17  6:52 AM   Result Value Ref Range    Phosphorus 5.3 (H) 2.6 - 4.7 MG/DL   GLUCOSE, POC    Collection Time: 11/03/17  7:39 AM   Result Value Ref Range    Glucose (POC) 112 (H) 65 - 100 mg/dL    Performed by NGNoel Christmas  BILIRUBIN, TOTAL     Collection Time: 11/03/17  8:30 AM   Result Value Ref Range    Bilirubin, total 2.2 (H) 0.2 - 1.0 MG/DL   ALT    Collection Time: 11/03/17  8:30 AM   Result Value Ref Range    ALT (SGPT) 246 (H) 12 - 78 U/L   AST    Collection Time: 11/03/17  8:30 AM   Result Value Ref Range    AST (SGOT) 200 (H) 15 - 37 U/L   GLUCOSE, POC    Collection Time: 11/03/17 11:34 AM   Result Value Ref Range    Glucose (POC) 125 (H) 65 - 100 mg/dL    Performed by NGNoel Christmas      IMAGING RESULTS:  11/02/2017 EXAM:  [place] [employer]  ??  INDICATION: Elevated liver function tests. Vomiting.  ??  COMPARISON:  None  ??  TECHNIQUE:  Limited ultrasound of the abdomen. In particular only the right  upper quadrant was evaluated.  ??  FINDINGS: The liver is enlarged. No evidence of a focal liver lesion. The portal  vein is patent with flow towards the liver. The diameter of the portal vein  measures 1.2 cm. The velocity measures 6 cm/sec.  ??  The gallbladder contains irregular biliary disc bulge. There is mild wall  thickening. No shadowing gallstones or pericholecystic fluid. There is no intra  or extrahepatic biliary ductal dilatation. The common bile duct measures 4 mm.  ??  The visualized pancreas and right kidney are within normal limits.  ??  IMPRESSION  IMPRESSION:  1. Hepatomegaly.  2. 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  3. Biliary sludge with mild gallbladder wall thickening.  I have personally reviewed the imaging reports    11/01/2017 KUB CLINICAL HISTORY: Vomiting  ??  Comparison to 12/17/2016  ??  Single KUB demonstrates a normal bowel gas pattern. Degenerative changes are  seen in the thoracolumbar spine.  ??  IMPRESSION: No acute process    Total time spent with patient: 50 minutes ________________________________________________________________________  Care Plan discussed with:  Patient Y   Family     RN               Consultant:       CT  11/03/2017:  ________________________________________________________________________     ___________________________________________________  Consulting Provider: Santa Genera, NP      11/03/2017  3:39 PM

## 2017-11-03 NOTE — Progress Notes (Signed)
[facility]  Name: Brittney LimesMary S Shelton MRN: [id number]   DOB: 18-Oct-1950 [facility]   Date: 11/03/2017        IMPRESSION:   ?? Acute/chronic hypoxic/hypercapnic respiratory failure; on 4 LPm at home for many yrs since she lived in [place]; Currently on 50 liters and 80%  .   ?? Community acquired pneumonia- completed ab course   ?? "COPD exacerbation" - she has a diagnosis of COPD, but she has no obstruction on PFTs; patient of Dr. Jayme CloudGonzalez  ?? Acute renal failure- numbers increased   ?? Severe Pulmonary HTN with dilated RV and RV failure on echo; CTA negative for PE; this did not happen overnight  ?? LVH/diastolic heart failure- cxr yesterday still looked  Wet . Mild edema   ?? DM/hyperglycemia  ?? Obesity - ?OSA/OHS  ?? Constipation, nausea  ?? Elevated lft's noted on yesterday lab.  She reports rle ( not ruq ) tenderness of ? Sig .  [place] showed hepatomegly and dilated portal vein with slow flow  and biliary sludge and gb wall thickening. -? Related to hepatic congestion vs other   ?? DROP  IN PLATELETS- on lovenox 40 mg bid for prophylaxis.        PLAN:   ?? CCU  Monitoring - still at risk for decline and no wiggle room resp wise   ?? wean HFNC as able - not much change from yesterday    ?? On bipap or HFNC  , will order VQ scan  This week  If / when she can go down stairs for test resp wise   ?? Eventually needs diagnostic RHC, vasodilator trial but not sure if she will ever become stable enough  ?? If she declines, will need intubation  ?? Incentive spirometer- told pt may take awhile for her to recover and can make no predictions how fast  ?? Bronchodilators - adjust to day time   ?? Empiric antibiotics to complete a course- done   ?? Follow up cultures- done   ?? Diurese daily- noted increased renal numbers but  Will stay where we are for now due to edema and limited pulm reserve.  ?? cxr and labs n am    ?? HIT AND SEROTONIN PANEL - HOLD LOVENOX - DISCUSSED WITH PHARMACIST AND RN   ?? Will eventually need sleep study   ?? Monitor  Renal function   ?? Insulin   ?? DVT prophyaxis  ?? Slowly advance diet as tolerated   ?? Miralax , Zofran  ?? Condition remains guarded.  Not yet stable enough to undergo v/q or additional testing out of icu.   ?? Add liver labs this am -   ?? ? Gi eval if lft's continue to  Jump   ?? Will  Discuss with Dr. Fraser DinJOhri for any role for trial of sildenafil. - as tolerated by bp   ?? Wean pred      Subjective/Interval History:   I have reviewed the flowsheet and previous day???s notes.  6/2 continues to have nausea. States she had a BM yesterday and this morning. No abd bloating or pain. Breathing maybe a little better.  HFNC 25L, 70%    6/1 feels bloated.  Also nauseated and threw up some of her lunch.  No abd pain, but hasn't had a good BM while here.    HFNC has been decreased to ~20L and 70%. O2 sats accetable at rest,  but decrease with movement.      5/31 HFNC 45 LPM. Explained pathophysiology of PHTN and RV failure with pt. Had sleep study but "never told results". Explained to her that convetnional HTN meds does not help and may hurt PHTN. Tough problem    5/30 HFNC 45 LPM. OOB the other day. Slept without BIPAP. ROS otherwise negative    5/29 BIPAP then HFNC this AM. sats hovering close to 88%- 90%. No congestion. No fver. I > O since admission. No preipheral edema. CXr reviewed- no effusion    5/28 No acute events overnight.On bipap.   No severe distress    6/3 - was sleeping without any resp distress when I came in. As can best tell was on high flow overnight.   6/4- says breathing is ok . Admits to rlq discomfort on direst palp - no rebound     Objective:  Vital Signs:    Visit Vitals  BP 104/71   Pulse 88   Temp 98.1 ??F (36.7 ??C)   Resp 22   Ht 5\' 6"  (1.676 m)   Wt 135 kg (297 lb 9.9 oz)   SpO2 94%   BMI 48.04 kg/m??       O2 Device: Hi flow nasal cannula   O2 Flow Rate (L/min): 50 l/min    Temp (24hrs), Avg:98.3 ??F (36.8 ??C), Min:98 ??F (36.7 ??C), Max:98.5 ??F (36.9 ??C)     Intake/Output:   Last shift:      No intake/output data recorded.  Last 3 shifts: 06/02 1901 - 06/04 0700  In: 660 [P.O.:660]  Out: 750 [Urine:750]    Intake/Output Summary (Last 24 hours) at 11/03/2017 0744  Last data filed at 11/02/2017 2000  Gross per 24 hour   Intake 180 ml   Output 750 ml   Net -570 ml     Physical exam   heent - high flow in place  Neck -  nontender   cardiac - rrr , sinus   Lungs- ant /lat coarse   abd - soft   And subjective  Mild tenderness in the rlq   Ext-   Large with plus one edema   neuro - appears alert and  Oriented    gen - no distress on high flow  Supplemental oxygen.     Data:     Current Facility-Administered Medications   Medication Dose Route Frequency   ??? senna-docusate (PERICOLACE) 8.6-50 mg per tablet 2 Tab  2 Tab Oral BID   ??? albuterol-ipratropium (DUO-NEB) 2.5 MG-0.5 MG/3 ML  3 mL Nebulization Q6H RT   ??? polyethylene glycol (MIRALAX) packet 17 g  17 g Oral DAILY   ??? furosemide (LASIX) tablet 40 mg  40 mg Oral DAILY   ??? predniSONE (DELTASONE) tablet 20 mg  20 mg Oral DAILY WITH BREAKFAST   ??? enoxaparin (LOVENOX) injection 40 mg  40 mg SubCUTAneous Q12H   ??? metoprolol succinate (TOPROL-XL) XL tablet 25 mg  25 mg Oral DAILY   ??? insulin lispro (HUMALOG) injection   SubCUTAneous AC&HS   ??? sodium chloride (NS) flush 5-40 mL  5-40 mL IntraVENous Q8H   ??? aspirin delayed-release tablet 81 mg  81 mg Oral DAILY   ??? pravastatin (PRAVACHOL) tablet 20 mg  20 mg Oral QHS                Labs:  Recent Labs     11/03/17  0517   WBC 11.3*   HGB 12.9  HCT 39.8   PLT 109*     Recent Labs     11/03/17  0652 11/02/17  0844 11/01/17  0513   NA 137 136 137   K 4.8 4.9 4.4   CL 102 103 105   CO2 27 27 26    GLU 115* 119* 127*   BUN 65* 53* 49*   CREA 2.14* 1.94* 1.39*   CA 8.4* 8.5 8.3*   MG 2.5*  --   --    PHOS 5.3*  --   --    ALB  --  3.3*  --    TBILI  --  2.3*  --    SGOT  --  178*  --    ALT  --  149*  --       No results for input(s): PHI, PCO2I, PO2I, HCO3I, FIO2I in the last 72 hours.  Imaging:  I have personally reviewed the patient???s radiographs and have reviewed the reports:  cxr yesterday looked wet but better than compared to earlier          Caro Laroche, MD

## 2017-11-03 NOTE — Progress Notes (Signed)
Hospitalist Progress Note  NAME: Brittney Shelton   DOB:  01/24/1951   MRN:  [id number]       Assessment / Plan:    Nausea/vomiting  Constipation resolved   Elevated LFTs: r/o biliary colic   -KUB 6/2 normal   -Abd [place] Hepatomegaly.  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  Biliary sludge with mild gallbladder wall thickening.    -had good BM yesterday; Cont bowel regiment     Thrombocytopenia  -HIT ab and SRA pending    Acute on chronic respiratory failure with hypoxia POA (baseline 4LNC)  Acute on chronic diastolic HF POA  Cor pulmonale POA due to severe pulmonary hypertension   Right lower lobe pneumonia  COPD exacerbation POA  -remains on high flow oxygen; desaturating easily with minimal exertion   -BIPAP prn   -completed Levaquin   -steroid taper  -scheduled duonebs  ??  Acute kidney injury POA  -last normal Cr 09/2017,  -follow.  Restarted on lasix  -monitor closely. Avoid nephrotoxic drugs, adjust all meds to GFR.   ??  Essential HTN POA  -BP low side 90/100  -cont BB/lasix   -Holding Norvasc/ Imdur   ??  DM type 2 POA  Diet controlled at home  Last HgBa1c 6.4 in Dec 2018  Hyperglycemic on steroids; refusing insulin most of the time    ??  Right pleural effusion POA  Small, doubt thoracentesis would be helpful  ??  Hyperkalemia, mild, resolved   Remote tobacco use quit 20 years ago  40 or above Morbid obesity / Body mass index is 48.04 kg/m??.    Code status: Full  Prophylaxis: Hep SQ     Recommended Disposition: remain in ICU      Subjective:     Chief Complaint / Reason for Physician Visit:   follow up respiratory failure/ copd   Had good BM s/p lactulose   Discussed with RN events overnight.     Review of Systems:  Symptom Y/N Comments  Symptom Y/N Comments   Fever/Chills n   Chest Pain n    Poor Appetite    Edema     Cough    Abdominal Pain n    Sputum    Joint Pain     SOB/DOE    Pruritis/Rash     Nausea/vomit n   Tolerating PT/OT     Diarrhea n   Tolerating Diet     Constipation n   Other        Could NOT obtain due to:      Objective:     VITALS:   Last 24hrs VS reviewed since prior progress note. Most recent are:  Patient Vitals for the past 24 hrs:   Temp Pulse Resp BP SpO2   11/03/17 1400 ??? 94 26 113/65 95 %   11/03/17 1300 ??? 91 22 119/61 97 %   11/03/17 1200 98.1 ??F (36.7 ??C) 92 24 (!) 117/26 91 %   11/03/17 1137 ??? ??? ??? ??? 92 %   11/03/17 1100 ??? 94 20 98/59 95 %   11/03/17 1000 ??? 90 21 97/57 96 %   11/03/17 0900 ??? 88 17 110/71 95 %   11/03/17 0813 ??? ??? ??? ??? 95 %   11/03/17 0800 98.2 ??F (36.8 ??C) 85 22 94/59 91 %   11/03/17 0700 ??? 88 22 104/71 94 %   11/03/17 0600 ??? 86 21 ??? 93 %   11/03/17 0500 ??? 88 21 (!)  100/38 98 %   11/03/17 0400 98.1 ??F (36.7 ??C) 87 21 100/45 95 %   11/03/17 0300 ??? 88 20 109/67 95 %   11/03/17 0207 ??? ??? ??? ??? 94 %   11/03/17 0200 ??? 93 23 108/73 92 %   11/03/17 0100 ??? 90 22 92/60 92 %   11/03/17 0000 98.4 ??F (36.9 ??C) 92 23 94/51 92 %   11/02/17 2355 ??? ??? ??? ??? 92 %   11/02/17 2300 ??? 92 23 110/58 91 %   11/02/17 2200 ??? 92 26 92/57 90 %   11/02/17 2100 ??? 91 24 103/58 90 %   11/02/17 2011 ??? ??? ??? ??? 93 %   11/02/17 2000 98.5 ??F (36.9 ??C) 93 22 115/67 92 %   11/02/17 1954 ??? ??? ??? ??? 92 %   11/02/17 1900 ??? 92 25 ??? 95 %   11/02/17 1800 ??? 94 26 ??? 92 %   11/02/17 1700 ??? 88 22 97/64 92 %   11/02/17 1611 ??? 89 28 92/57 94 %   11/02/17 1600 98.4 ??F (36.9 ??C) 88 22 106/65 93 %   11/02/17 1500 ??? 88 24 116/68 93 %       Intake/Output Summary (Last 24 hours) at 11/03/2017 1446  Last data filed at 11/03/2017 1000  Gross per 24 hour   Intake 120 ml   Output 350 ml   Net -230 ml        PHYSICAL EXAM:  General: WD, WN. Alert, cooperative, no acute distress, morbidly obese  EENT:  EOMI. Anicteric sclerae. MMM  Resp:  Exam limited by habitus, no wheezing or rales  CV:  Regular  Rhythm,??+ edema  GI:  Soft, Non distended, Non tender. ??+Bowel sounds  Neurologic:?? Alert and oriented X 3, normal speech,   Psych:???? Fair insight.??Not anxious nor agitated    Reviewed most current lab test results and cultures  YES   Reviewed most current radiology test results   YES  Review and summation of old records today    NO  Reviewed patient's current orders and MAR    YES  PMH/SH reviewed - no change compared to H&P  ________________________________________________________________________  Care Plan discussed with:    Comments   Patient y    Family      RN y    Buyer, retail                        Multidiciplinary team rounds were held today with case manager, nursing, pharmacist and Higher education careers adviser.  Patient's plan of care was discussed; medications were reviewed and discharge planning was addressed.     ________________________________________________________________________  Total NON critical care TIME:  25  Minutes    Total CRITICAL CARE TIME Spent:   Minutes non procedure based      Comments   >50% of visit spent in counseling and coordination of care     ________________________________________________________________________  Pamalee Leyden, MD     Procedures: see electronic medical records for all procedures/Xrays and details which were not copied into this note but were reviewed prior to creation of Plan.      LABS:  I reviewed today's most current labs and imaging studies.  Pertinent labs include:  Recent Labs     11/03/17  0517   WBC 11.3*   HGB 12.9   HCT 39.8   PLT 109*     Recent Labs  11/03/17  0830 11/03/17  0652 11/02/17  0844 11/01/17  0513   NA  --  137 136 137   K  --  4.8 4.9 4.4   CL  --  102 103 105   CO2  --  27 27 26    GLU  --  115* 119* 127*   BUN  --  65* 53* 49*   CREA  --  2.14* 1.94* 1.39*   CA  --  8.4* 8.5 8.3*   MG  --  2.5*  --   --    PHOS  --  5.3*  --   --    ALB  --   --  3.3*  --    TBILI 2.2*  --  2.3*  --    SGOT 200*  --  178*  --    ALT 246*  --  149*  --        Signed: Pamalee LeydenJoseph L Aviraj Kentner, MD

## 2017-11-04 ENCOUNTER — Inpatient Hospital Stay: Admit: 2017-11-04 | Payer: MEDICARE | Primary: Internal Medicine

## 2017-11-04 LAB — METABOLIC PANEL, COMPREHENSIVE
A-G Ratio: 1 — ABNORMAL LOW (ref 1.1–2.2)
ALT (SGPT): 288 U/L — ABNORMAL HIGH (ref 12–78)
AST (SGOT): 151 U/L — ABNORMAL HIGH (ref 15–37)
Albumin: 3.6 g/dL (ref 3.5–5.0)
Alk. phosphatase: 93 U/L (ref 45–117)
Anion gap: 11 mmol/L (ref 5–15)
BUN/Creatinine ratio: 34 — ABNORMAL HIGH (ref 12–20)
BUN: 73 MG/DL — ABNORMAL HIGH (ref 6–20)
Bilirubin, total: 2 MG/DL — ABNORMAL HIGH (ref 0.2–1.0)
CO2: 26 mmol/L (ref 21–32)
Calcium: 8.5 MG/DL (ref 8.5–10.1)
Chloride: 100 mmol/L (ref 97–108)
Creatinine: 2.14 MG/DL — ABNORMAL HIGH (ref 0.55–1.02)
GFR est AA: 28 mL/min/{1.73_m2} — ABNORMAL LOW (ref >60)
GFR est non-AA: 23 mL/min/{1.73_m2} — ABNORMAL LOW (ref >60)
Globulin: 3.6 g/dL (ref 2.0–4.0)
Glucose: 179 mg/dL — ABNORMAL HIGH (ref 65–100)
Potassium: 4.8 mmol/L (ref 3.5–5.1)
Protein, total: 7.2 g/dL (ref 6.4–8.2)
Sodium: 137 mmol/L (ref 136–145)

## 2017-11-04 LAB — PHOSPHORUS: Phosphorus: 4.9 MG/DL — ABNORMAL HIGH (ref 2.6–4.7)

## 2017-11-04 LAB — GLUCOSE, POC
Glucose (POC): 208 mg/dL — ABNORMAL HIGH (ref 65–100)
Glucose (POC): 156 mg/dL — ABNORMAL HIGH (ref 65–100)
Glucose (POC): 156 mg/dL — ABNORMAL HIGH (ref 65–100)

## 2017-11-04 LAB — HEPARIN INDUCED PLATELET AB BY EIA: Heparin Induced Platelet Ab: 0.242 OD (ref 0.000–0.400)

## 2017-11-04 LAB — CBC WITH AUTOMATED DIFF
ABS. BASOPHILS: 0 10*3/uL (ref 0.0–0.1)
ABS. EOSINOPHILS: 0.2 10*3/uL (ref 0.0–0.4)
ABS. IMM. GRANS.: 0 10*3/uL (ref 0.00–0.04)
ABS. LYMPHOCYTES: 0.7 10*3/uL — ABNORMAL LOW (ref 0.8–3.5)
ABS. MONOCYTES: 0.2 10*3/uL (ref 0.0–1.0)
ABS. NEUTROPHILS: 10 10*3/uL — ABNORMAL HIGH (ref 1.8–8.0)
ABSOLUTE NRBC: 0.03 10*3/uL — ABNORMAL HIGH (ref 0.00–0.01)
BASOPHILS: 0 % (ref 0–1)
EOSINOPHILS: 2 % (ref 0–7)
HCT: 41.1 % (ref 35.0–47.0)
HGB: 13.2 g/dL (ref 11.5–16.0)
IMMATURE GRANULOCYTES: 0 % (ref 0.0–0.5)
LYMPHOCYTES: 6 % — ABNORMAL LOW (ref 12–49)
MCH: 27.6 PG (ref 26.0–34.0)
MCHC: 32.1 g/dL (ref 30.0–36.5)
MCV: 86 FL (ref 80.0–99.0)
MONOCYTES: 2 % — ABNORMAL LOW (ref 5–13)
MPV: ABNORMAL FL (ref 8.9–12.9)
NEUTROPHILS: 90 % — ABNORMAL HIGH (ref 32–75)
NRBC: 0.3 PER 100 WBC — ABNORMAL HIGH
PLATELET: 111 10*3/uL — ABNORMAL LOW (ref 150–400)
RBC: 4.78 M/uL (ref 3.80–5.20)
RDW: 19.7 % — ABNORMAL HIGH (ref 11.5–14.5)
WBC: 11.1 10*3/uL — ABNORMAL HIGH (ref 3.6–11.0)

## 2017-11-04 LAB — PROCALCITONIN: Procalcitonin: 0.2 ng/mL

## 2017-11-04 LAB — MAGNESIUM: Magnesium: 2.5 mg/dL — ABNORMAL HIGH (ref 1.6–2.4)

## 2017-11-04 MED FILL — TOPROL XL 25 MG TABLET,EXTENDED RELEASE: 25 mg | ORAL | Qty: 1

## 2017-11-04 MED FILL — ASPIRIN 81 MG TAB, DELAYED RELEASE: 81 mg | ORAL | Qty: 1

## 2017-11-04 MED FILL — DOK PLUS 8.6 MG-50 MG TABLET: ORAL | Qty: 2

## 2017-11-04 MED FILL — FUROSEMIDE 40 MG TAB: 40 mg | ORAL | Qty: 1

## 2017-11-04 MED FILL — PREDNISONE 5 MG TAB: 5 mg | ORAL | Qty: 2

## 2017-11-04 MED FILL — IPRATROPIUM-ALBUTEROL 2.5 MG-0.5 MG/3 ML NEB SOLUTION: 2.5 mg-0.5 mg/3 ml | RESPIRATORY_TRACT | Qty: 3

## 2017-11-04 MED FILL — ONDANSETRON (PF) 4 MG/2 ML INJECTION: 4 mg/2 mL | INTRAMUSCULAR | Qty: 2

## 2017-11-04 MED FILL — INSULIN LISPRO 100 UNIT/ML INJECTION: 100 unit/mL | SUBCUTANEOUS | Qty: 1

## 2017-11-04 MED FILL — HEALTHYLAX 17 GRAM ORAL POWDER PACKET: 17 gram | ORAL | Qty: 1

## 2017-11-04 NOTE — Progress Notes (Signed)
[facility] Critical Care, [facility] Sleep Medicine  Name: Brittney Shelton MRN: [id number]   DOB: [birth year] Hospital: [facility]   Date: 11/04/2017        IMPRESSION:   ?? Acute/chronic hypoxic/hypercapnic respiratory failure; on 4 LPm at home for many yrs since she lived in [place]; Currently on 50 liters and 90%. Desaturated this am when her oxygen was disconnected.    ?? Community acquired pneumonia- completed antibiotic course   ?? "COPD exacerbation" - she has a diagnosis of COPD, but she has no obstruction on PFTs; patient of Dr. Jayme Cloud  ?? Acute renal failure- numbers increased   ?? Severe Pulmonary HTN with dilated RV and RV failure on echo; CTA negative for PE; this did not happen overnight  ?? LVH/diastolic heart failure- cxr yesterday still looked  Wet . Mild edema   ?? DM/hyperglycemia  ?? Obesity - ?OSA/OHS  ?? Constipation, nausea  ?? Elevated lft's noted on yesterday lab.  She reports rle ( not ruq ) tenderness of ? Sig .  US showed hepatomegly and dilated portal vein with slow flow  and biliary sludge and gb wall thickening. -? Related to hepatic congestion vs other   ?? DROP  IN PLATELETS- on lovenox 40 mg bid for prophylaxis.        PLAN:   ?? CCU  Monitoring - still at risk for decline and no wiggle room resp wise   ?? Keep prednisone at same dose for now.   ?? wean HFNC as able - not much change from yesterday    ?? On bipap or HFNC, , will order VQ scan  This week  If / when she can go down stairs for test resp wise. Has not been stable enough at this point.  ?? Will ask PICC team to assess for PICC line versus, Midline.   ?? Eventually needs diagnostic RHC, vasodilator trial but not sure if she will ever become stable enough  ?? If she declines, will need intubation  ?? Incentive spirometer- told pt may take awhile for her to recover and can make no predictions how fast  ?? Bronchodilators - adjust to day time   ?? Empiric antibiotics to complete a course- done    ?? Follow up cultures- done   ?? Diurese daily- noted increased renal numbers but  Will stay where we are for now due to edema and limited pulm reserve. Will hold the diuretic for now.   ?? cxr and labs n am   ?? HIT AND SEROTONIN PANEL - HOLD LOVENOX - DISCUSSED WITH PHARMACIST AND RN   ?? Will eventually need sleep study   ?? Monitor  Renal function, Need to repeat labs today.   ?? Insulin   ?? DVT prophyaxis  ?? Slowly advance diet as tolerated   ?? Miralax , Zofran  ?? Condition remains guarded.  Not yet stable enough to undergo v/q or additional testing out of icu.   ?? Add liver labs this am -   ?? ? Gi eval if lft's continue to  Jump   ?? Ongoing evaluations and treatments.      Subjective/Interval History:   I have reviewed the flowsheet and previous day???s notes.    11-04-17: Pt feels tired, weak this am. Has fatigue. She was feeling nauseated and sick to her stomach. No chest pain, no back pain, no abdominal pain, no leg pain.  Has poor appetite this am.  5/30 HFNC 45 LPM. OOB the other day. Slept without BIPAP. ROS otherwise negative    5/29 BIPAP then HFNC this AM. sats hovering close to 88%- 90%. No congestion. No fver. I > O since admission. No preipheral edema. CXr reviewed- no effusion    5/28 No acute events overnight.On bipap.   No severe distress    6/3 - was sleeping without any resp distress when I came in. As can best tell was on high flow overnight.   6/4- says breathing is ok . Admits to rlq discomfort on direst palp - no rebound     Objective:  Vital Signs:    Visit Vitals  BP 108/60   Pulse 88   Temp 97.8 ??F (36.6 ??C)   Resp 22   Ht 5\' 6"  (1.676 m)   Wt 135 kg (297 lb 9.9 oz)   SpO2 96%   BMI 48.04 kg/m??       O2 Device: Heated, Hi flow nasal cannula   O2 Flow Rate (L/min): 50 l/min   Temp (24hrs), Avg:98 ??F (36.7 ??C), Min:97.8 ??F (36.6 ??C), Max:98.2 ??F (36.8 ??C)     Intake/Output:   Last shift:      No intake/output data recorded.  Last 3 shifts: 06/03 1901 - 06/05 0700  In: 360 [P.O.:360]   Out: 650 [Urine:650]    Intake/Output Summary (Last 24 hours) at 11/04/2017 0738  Last data filed at 11/04/2017 0050  Gross per 24 hour   Intake 360 ml   Output 300 ml   Net 60 ml     Physical exam  Gen: morbidly obese.  no distress on high flow  Supplemental oxygen.   heent - high flow in place  Neck -  nontender   cardiac - rrr , sinus   Lungs- ant /lat coarse, some decreased BS in bases.    abd - soft   And subjective  Mild tenderness in the rlq   Ext-   Large with plus one edema of bilateral lower extremities.    neuro - appears alert and  Oriented, seems grossly weak.    Psych: nonfocal. NO overt depression, no anxiety.     Data:     Current Facility-Administered Medications   Medication Dose Route Frequency   ??? albuterol-ipratropium (DUO-NEB) 2.5 MG-0.5 MG/3 ML  3 mL Nebulization QID RT   ??? furosemide (LASIX) tablet 20 mg  20 mg Oral DAILY   ??? predniSONE (DELTASONE) tablet 10 mg  10 mg Oral DAILY WITH BREAKFAST   ??? senna-docusate (PERICOLACE) 8.6-50 mg per tablet 2 Tab  2 Tab Oral BID   ??? polyethylene glycol (MIRALAX) packet 17 g  17 g Oral DAILY   ??? metoprolol succinate (TOPROL-XL) XL tablet 25 mg  25 mg Oral DAILY   ??? insulin lispro (HUMALOG) injection   SubCUTAneous AC&HS   ??? sodium chloride (NS) flush 5-40 mL  5-40 mL IntraVENous Q8H   ??? aspirin delayed-release tablet 81 mg  81 mg Oral DAILY                Labs:  Recent Labs     11/03/17  0517   WBC 11.3*   HGB 12.9   HCT 39.8   PLT 109*     Recent Labs     11/03/17  0830 11/03/17  0652 11/02/17  0844   NA  --  137 136   K  --  4.8 4.9   CL  --  102 103  CO2  --  27 27   GLU  --  115* 119*   BUN  --  65* 53*   CREA  --  2.14* 1.94*   CA  --  8.4* 8.5   MG  --  2.5*  --    PHOS  --  5.3*  --    ALB  --   --  3.3*   TBILI 2.2*  --  2.3*   SGOT 200*  --  178*   ALT 246*  --  149*     No results for input(s): PHI, PCO2I, PO2I, HCO3I, FIO2I in the last 72 hours.  Imaging:  I have personally reviewed the patient???s radiographs and have reviewed the reports:   cxr portable: 11-04-17: IMPRESSION:  ??  Unchanged mild interstitial pulmonary edema and cardiomegaly. No pneumothorax.  Consider PA and lateral chest views when the patient can better tolerate.          Constance Goltzrew G Shaunda Tipping, MD

## 2017-11-04 NOTE — Progress Notes (Addendum)
1100: Pt. [place]    1105: Pt. Assessed. Pt. Alert and oriented X4. Pt. Moves all extremities, follows all commands. Pt. Is on high Flo NC 84% and 50 Liters. Oxygen saturation 95%. Breath sounds Clear, diminished only in the bases. Bowel sounds active. Pt. Denies pain/Nausea. Pt. Would like to get in the chair for lunch. Pt. Has two PIV's. Skin intact. Pulses all palpable. Edema 3+ to lower extremities.     Pt. Picked at her breakfast, did not eat much. Pt. Refused her Morning insulin.     1200: Pt. Now in the care of Darl [place]an, [place].

## 2017-11-04 NOTE — Progress Notes (Signed)
Interdisciplinary team rounds were held 11/04/2017 with the following team members: Care Management, Diabetes Treatment Specialist, Nursing, Nutrition, Pharmacy, Physical Therapy, Physician and Respiratory Therapy.     Plan of care discussed. Goal: See MD orders and progress notes for further  interventions and desired outcomes.

## 2017-11-04 NOTE — Progress Notes (Addendum)
Hospitalist Progress Note  NAME: Brittney Shelton   DOB:  12-May-1951   MRN:  [id number]       Assessment / Plan:    Acute on chronic respiratory failure with hypoxia POA (baseline 4LNC)  Acute on chronic diastolic HF POA  Cor pulmonale POA due to severe pulmonary hypertension   Right lower lobe pneumonia  COPD exacerbation POA  -remains on high flow oxygen; desaturating easily with minimal exertion   -BIPAP prn   -completed Levaquin   -continue low dose prednisone  -scheduled duonebs  -diurese prn  ??  Acute kidney injury POA  -last normal Cr 09/2017,  -monitor closely. Avoid nephrotoxic drugs, adjust all meds to GFR.   ??  Essential HTN POA  -cont BB/  -Holding Norvasc/ Imdur   ??  DM type 2 POA  Diet controlled at home  Last HgBa1c 6.4 in Dec 2018  Hyperglycemic on steroids; refusing insulin most of the time    SSI prn  ??  Right pleural effusion POA  Small, doubt thoracentesis would be helpful  ??  Nausea/vomiting  Constipation resolved   Elevated LFTs: r/o biliary colic   -KUB 6/2 normal   -Abd [place] Hepatomegaly.  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  Biliary sludge with mild gallbladder wall thickening.    -Cont bowel regiment     Thrombocytopenia  -HIT ab low        Hyperkalemia, mild, resolved   Remote tobacco use quit 20 years ago  40 or above Morbid obesity / Body mass index is 48.04 kg/m??.    Code status: Full  Prophylaxis: Hep SQ     Recommended Disposition: remain in ICU      Subjective:     Chief Complaint / Reason for Physician Visit:   follow up respiratory failure/ copd   Discussed with RN events overnight.     Review of Systems:  Symptom Y/N Comments  Symptom Y/N Comments   Fever/Chills n   Chest Pain n    Poor Appetite    Edema     Cough    Abdominal Pain n    Sputum    Joint Pain     SOB/DOE    Pruritis/Rash     Nausea/vomit n   Tolerating PT/OT     Diarrhea n   Tolerating Diet     Constipation n   Other       Could NOT obtain due to:      Objective:     VITALS:    Last 24hrs VS reviewed since prior progress note. Most recent are:  Patient Vitals for the past 24 hrs:   Temp Pulse Resp BP SpO2   11/04/17 1515 ??? ??? ??? ??? 91 %   11/04/17 1410 ??? 86 24 102/70 93 %   11/04/17 1300 ??? 88 24 103/67 (!) 89 %   11/04/17 1226 ??? ??? ??? ??? 94 %   11/04/17 1200 97.6 ??F (36.4 ??C) 88 25 96/65 90 %   11/04/17 1100 97.7 ??F (36.5 ??C) 88 22 110/67 93 %   11/04/17 1000 ??? 91 21 105/68 (!) 88 %   11/04/17 0800 ??? 87 18 104/70 92 %   11/04/17 0748 ??? ??? ??? ??? 94 %   11/04/17 0500 ??? 88 22 108/60 ???   11/04/17 0400 ??? 88 21 105/58 ???   11/04/17 0300 ??? 87 22 115/67 96 %   11/04/17 0200 ??? 94 28 ??? 94 %  11/04/17 0100 ??? 100 29 ??? (!) 84 %   11/04/17 0000 ??? 98 28 ??? 95 %   11/03/17 2300 ??? 96 9 107/60 93 %   11/03/17 2200 ??? 97 19 98/70 94 %   11/03/17 2112 ??? ??? ??? ??? 95 %   11/03/17 2100 ??? 95 24 ??? 93 %   11/03/17 2000 97.8 ??F (36.6 ??C) 98 27 114/51 91 %   11/03/17 1900 ??? (!) 102 26 104/64 91 %   11/03/17 1700 ??? 90 21 124/57 96 %   11/03/17 1600 ??? 91 22 118/77 95 %       Intake/Output Summary (Last 24 hours) at 11/04/2017 1540  Last data filed at 11/04/2017 1300  Gross per 24 hour   Intake 720 ml   Output 300 ml   Net 420 ml        PHYSICAL EXAM:  General: WD, WN. Alert, cooperative, no acute distress, morbidly obese  EENT:  EOMI. Anicteric sclerae. MMM  Resp:  Exam limited by habitus, no wheezing or rales  CV:  Regular  Rhythm,??+ edema  GI:  Soft, Non distended, Non tender. ??+Bowel sounds  Neurologic:?? Alert and oriented X 3, normal speech,   Psych:???? Fair insight.??Not anxious nor agitated    Reviewed most current lab test results and cultures  YES  Reviewed most current radiology test results   YES  Review and summation of old records today    NO  Reviewed patient's current orders and MAR    YES  PMH/SH reviewed - no change compared to H&P  ________________________________________________________________________  Care Plan discussed with:    Comments   Patient y    Family      RN y    Buyer, retailCare Manager     Consultant                         Multidiciplinary team rounds were held today with case manager, nursing, pharmacist and Higher education careers adviserclinical coordinator.  Patient's plan of care was discussed; medications were reviewed and discharge planning was addressed.     ________________________________________________________________________  Total NON critical care TIME:  25  Minutes    Total CRITICAL CARE TIME Spent:   Minutes non procedure based      Comments   >50% of visit spent in counseling and coordination of care     ________________________________________________________________________  Pamalee LeydenJoseph L My Madariaga, MD     Procedures: see electronic medical records for all procedures/Xrays and details which were not copied into this note but were reviewed prior to creation of Plan.      LABS:  I reviewed today's most current labs and imaging studies.  Pertinent labs include:  Recent Labs     11/03/17  0517   WBC 11.3*   HGB 12.9   HCT 39.8   PLT 109*     Recent Labs     11/03/17  0830 11/03/17  0652 11/02/17  0844   NA  --  137 136   K  --  4.8 4.9   CL  --  102 103   CO2  --  27 27   GLU  --  115* 119*   BUN  --  65* 53*   CREA  --  2.14* 1.94*   CA  --  8.4* 8.5   MG  --  2.5*  --    PHOS  --  5.3*  --    ALB  --   --  3.3*   TBILI 2.2*  --  2.3*   SGOT 200*  --  178*   ALT 246*  --  149*       Signed: Pamalee Leyden, MD

## 2017-11-04 NOTE — Progress Notes (Signed)
"  I dont feel good today".  No c/o pain.    O 2 sat 90% on 50 LPM hi flow at 80%.   Refused breakfast.

## 2017-11-04 NOTE — Progress Notes (Signed)
[facility].  Diaphoretic, gray. Sats 69%.  RR 40-45 min with "grunting noise".  Upon inspection , Hiflo tubing had become disconnected.  Reconnected and RT increased O2 to 100%.  Nauseated and Zofran given.    01;20 Approximately 25 minutes later sats return to Baseline 88% with steady improvement.  Nausea improved.

## 2017-11-04 NOTE — Progress Notes (Signed)
GI PROGRESS NOTE   Santa Genera, [phone] NP in-hospital cell phone M-F until 4:30  After 5pm or on weekends, please call operator for physician on call  NAME:Brittney Shelton DOB:[birth year] UVO:[id number]   ATTG: Dr. Edd Arbour  PCP: Ermalene Postin, MD  Date/Time:  11/04/2017 9:03 AM     Primary GI: Dr. Gardenia Phlegm    Reason for following: Elevated LFTs    Assessment:   -Elevated LFTs  ?? WNL on admission, have spiked ALT/AST 246/200, Tbili 2.2 yesterday, no new labs to review today  ?? Abdominal ultrasound showing hepatomegaly, mildly dilated portal vein with slow flow, biliary sludge with mild gallbladder wall thickening.  ?? Just completed levaquin for RLL pneumonia  ?? Likely result of respiratory and cardiac status, possible has had some decreased blood flow to liver, should start to downtrend with continued medical management and optimization of all comorbidities  -Thrombocytopenia, 109 today  ?? HIT ab pending, SRA pending  -AKI  ?? BUN 65, creat 2.14, eGFR 28 yesterday, no new labs to review today  -Constipation, managed by primary team, on miralax daily and pericolace BID  -Respiratory failure  -COPD  -Pulmonary HTN  -CHF     Plan:   -No plan for endoscopic intervention at this time.  -Elevated LFTs likely related to pulmonary/cardiac status/exacerbations. Likely LFTs will downtrend with medical management. No h/o liver disease in past.  -Avoid hepatotoxic agents  -Supportive measures as clinically indicated.   -Daily LFTs for trending  -Continue with constipation regimen  -Will continue to follow  *Plan discussed with Dr. Gardenia Phlegm     Subjective:   Patient resting in bed watching TV during visit. Denies any pain. Reports still having trouble with breathing. Was hoping to feel better, but doesn't feel like she's doing as well as she'd like. Reports last night she had an episode where the oxygen wasn't working right, she got SOB, lightheaded, and had some nausea. No vomiting. Nausea, SOB, and  lightheadedness resolved once oxygen flow was restarted. Reports still has decreased appetite.    Complaint Y/N Description   Abdominal Pain N    Hematemesis     Hematochezia     Melena     Constipation     Diarrhea     Dyspepsia     Dysphagia     Jaundiced     Nausea/vomiting ? Nausea but no vomiting, overnight episode from lack of oxygen?     Review of Systems:  Symptom Y/N Comments  Symptom Y/N Comments   Fever/Chills    Chest Pain     Cough    Headaches     Sputum    Joint Pain     SOB/DOE    Pruritis/Rash     Tolerating Diet Y Diabetic consistent carb  Other       Could NOT obtain due to:      Objective:   VITALS:   Last 24hrs VS reviewed since prior progress note. Most recent are:  Visit Vitals  BP 104/70 (BP 1 Location: Right arm, BP Patient Position: At rest)   Pulse 87   Temp 97.8 ??F (36.6 ??C)   Resp 18   Ht '5\' 6"'  (1.676 m)   Wt 135 kg (297 lb 9.9 oz)   SpO2 92%   BMI 48.04 kg/m??       Intake/Output Summary (Last 24 hours) at 11/04/2017 0903  Last data filed at 11/04/2017 0050  Gross per 24 hour   Intake 360 ml  Output 300 ml   Net 60 ml     PHYSICAL EXAM:  General: WD, WN. Alert, cooperative, no acute distress????  HEENT: NC, Atraumatic. Anicteric sclerae.  Lungs:  CTA Bilaterally. No Wheezing/Rhonchi/Rales.  Heart:  Regular  rhythm,?? No murmur (-), No Rubs, No Gallops  Abdomen: Soft, Non distended, Non tender. ??+Bowel sounds, no HSM  Extremities: No c/c/e  Neurologic:?? Alert and oriented X 3.  No acute neurological distress   Psych:???? Good insight.??Not anxious nor agitated.    Lab and Radiology Data Reviewed: (see below)    Medications Reviewed: (see below)  PMH/SH reviewed - no change compared to H&P  ________________________________________________________________________  Total time spent with patient: 30 minutes ________________________________________________________________________  Care Plan discussed with:  Patient Y   Family     RN               Consultant:       Santa Genera, NP      Procedures: see electronic medical records for all procedures/Xrays and details which were not copied into this note but were reviewed prior to creation of Plan.      LABS:  Recent Labs     11/03/17  0517   WBC 11.3*   HGB 12.9   HCT 39.8   PLT 109*     Recent Labs     11/03/17  0652 11/02/17  0844   NA 137 136   K 4.8 4.9   CL 102 103   CO2 27 27   BUN 65* 53*   CREA 2.14* 1.94*   GLU 115* 119*   CA 8.4* 8.5   MG 2.5*  --    PHOS 5.3*  --      Recent Labs     11/03/17  0830 11/02/17  0844   SGOT 200* 178*   AP  --  63   TP  --  6.6   ALB  --  3.3*   GLOB  --  3.3   LPSE  --  41*     No results for input(s): INR, PTP, APTT in the last 72 hours.    No lab exists for component: INREXT   No results for input(s): FE, TIBC, PSAT, FERR in the last 72 hours.   No results found for: FOL, RBCF  No results for input(s): PH, PCO2, PO2 in the last 72 hours.  No results for input(s): CPK, CKMB in the last 72 hours.    No lab exists for component: TROPONINI  Lab Results   Component Value Date/Time    Color YELLOW/STRAW 10/23/2017 01:28 PM    Appearance CLEAR 10/23/2017 01:28 PM    Specific gravity 1.010 10/23/2017 01:28 PM    Specific gravity 1.010 12/30/2015 11:18 PM    pH (UA) 5.0 10/23/2017 01:28 PM    Protein NEGATIVE  10/23/2017 01:28 PM    Glucose NEGATIVE  10/23/2017 01:28 PM    Ketone NEGATIVE  10/23/2017 01:28 PM    Bilirubin NEGATIVE  10/23/2017 01:28 PM    Urobilinogen 0.2 10/23/2017 01:28 PM    Nitrites NEGATIVE  10/23/2017 01:28 PM    Leukocyte Esterase NEGATIVE  10/23/2017 01:28 PM    Epithelial cells FEW 10/23/2017 01:28 PM    Bacteria NEGATIVE  10/23/2017 01:28 PM    WBC 0-4 10/23/2017 01:28 PM    RBC 0-5 10/23/2017 01:28 PM       MEDICATIONS:  Current Facility-Administered Medications   Medication Dose Route Frequency   ??? albuterol-ipratropium (  DUO-NEB) 2.5 MG-0.5 MG/3 ML  3 mL Nebulization QID RT   ??? predniSONE (DELTASONE) tablet 10 mg  10 mg Oral DAILY WITH BREAKFAST    ??? senna-docusate (PERICOLACE) 8.6-50 mg per tablet 2 Tab  2 Tab Oral BID   ??? artificial tears (dextran 70-hypromellose) (NATURAL BALANCE) 0.1-0.3 % ophthalmic solution 1 Drop  1 Drop Both Eyes PRN   ??? polyethylene glycol (MIRALAX) packet 17 g  17 g Oral DAILY   ??? metoprolol succinate (TOPROL-XL) XL tablet 25 mg  25 mg Oral DAILY   ??? dextrose 10% infusion 125-250 mL  125-250 mL IntraVENous PRN   ??? insulin lispro (HUMALOG) injection   SubCUTAneous AC&HS   ??? sodium chloride (NS) flush 5-40 mL  5-40 mL IntraVENous Q8H   ??? sodium chloride (NS) flush 5-40 mL  5-40 mL IntraVENous PRN   ??? acetaminophen (TYLENOL) tablet 650 mg  650 mg Oral Q6H PRN   ??? naloxone (NARCAN) injection 0.4 mg  0.4 mg IntraVENous PRN   ??? ondansetron (ZOFRAN) injection 4 mg  4 mg IntraVENous Q4H PRN   ??? bisacodyl (DULCOLAX) suppository 10 mg  10 mg Rectal DAILY PRN   ??? aspirin delayed-release tablet 81 mg  81 mg Oral DAILY   ??? glucose chewable tablet 16 g  4 Tab Oral PRN   ??? glucagon (GLUCAGEN) injection 1 mg  1 mg IntraMUSCular PRN

## 2017-11-04 NOTE — Progress Notes (Addendum)
Bedside and Verbal shift change report given to Christiane HaJonathan (Cabin crewoncoming nurse) by Darl PikesSusan (offgoing nurse). Report included the following information SBAR, ED Summary, Intake/Output, MAR, Recent Results and Cardiac Rhythm sinus.     2150 Pt complains of knee pain due to arthritis, PRN Tylenol given, pt also complains of soreness at top of left ear due to high flo nasal canula strap, RN placed nasal canula foam padding on securement strap.       0100 Pt very anxious, complaining of nausea, reposition pt and pulled up in bed, called RT to adjust hi flow settings as pt saturations are low, gave pt PRN Zofran. Pt does not want to go back on BiPAP at this time, will continue to monitor closely.    0200 Pt resting in bed, oxygen saturations 88-91%, no complaints of nausea     0320 Pt unable to maintain oxygen saturations on Hi Flow, Pt placed on BiPAP,     0500 unable to obtain morning labs, will request PICC team assist with labs this morning.    0700 Bedside and Verbal shift change report given to Irving BurtonEmily (oncoming nurse) by Christiane HaJonathan (offgoing nurse). Report included the following information SBAR, Intake/Output, MAR, Recent Results and Cardiac Rhythm sinus.

## 2017-11-04 NOTE — Progress Notes (Signed)
Came to assess patient for PICC placement. According to notes from Dr. Yetta BarreJones and discussion with Primary Nurse Darl PikesSusan, RN., PICC ordered due to difficulty with lab draws.  Patient is currently on no IV medications and has a working 22g PIV.  Was able to obtain labs without difficulty and showed Primary RN, Darl PikesSusan, RN, vessel to obtain future labs.    Will discuss if central line is still needed with MD in the morning.  We will be happy to assist with additional labs or vascular access needs.     Sherryle LisSherry Lacey Wallman, RN, [facility]-BC  Vascular Access Team

## 2017-11-04 NOTE — Progress Notes (Addendum)
Patient OOB to chair with assistance.  1630 PICC team in and patient back to bed at this time.   1715 PICC team unable to get line in right arm. Picc team notes that patient has large AC vein in left arm and questioning if patient needs PICC line. Labs drawn via Left AC by PICC team and PICC team with re-evaluate in the morning and if PICC/midline still needed will attempt to place in left arm tomorrow.  1900 No changes. Report to [facility] RN.

## 2017-11-05 ENCOUNTER — Inpatient Hospital Stay: Admit: 2017-11-05 | Payer: MEDICARE | Primary: Internal Medicine

## 2017-11-05 LAB — CBC WITH AUTOMATED DIFF
ABS. BASOPHILS: 0.1 10*3/uL (ref 0.0–0.1)
ABS. EOSINOPHILS: 0.2 10*3/uL (ref 0.0–0.4)
ABS. IMM. GRANS.: 0.1 10*3/uL — ABNORMAL HIGH (ref 0.00–0.04)
ABS. LYMPHOCYTES: 1.4 10*3/uL (ref 0.8–3.5)
ABS. MONOCYTES: 1.7 10*3/uL — ABNORMAL HIGH (ref 0.0–1.0)
ABS. NEUTROPHILS: 11.5 10*3/uL — ABNORMAL HIGH (ref 1.8–8.0)
ABSOLUTE NRBC: 0.08 10*3/uL — ABNORMAL HIGH (ref 0.00–0.01)
BASOPHILS: 0 % (ref 0–1)
EOSINOPHILS: 1 % (ref 0–7)
HCT: 40.5 % (ref 35.0–47.0)
HGB: 12.9 g/dL (ref 11.5–16.0)
IMMATURE GRANULOCYTES: 1 % — ABNORMAL HIGH (ref 0.0–0.5)
LYMPHOCYTES: 9 % — ABNORMAL LOW (ref 12–49)
MCH: 27.6 PG (ref 26.0–34.0)
MCHC: 31.9 g/dL (ref 30.0–36.5)
MCV: 86.7 FL (ref 80.0–99.0)
MONOCYTES: 11 % (ref 5–13)
MPV: ABNORMAL FL (ref 8.9–12.9)
NEUTROPHILS: 77 % — ABNORMAL HIGH (ref 32–75)
NRBC: 0.5 PER 100 WBC — ABNORMAL HIGH
PLATELET: 122 10*3/uL — ABNORMAL LOW (ref 150–400)
RBC: 4.67 M/uL (ref 3.80–5.20)
RDW: 20 % — ABNORMAL HIGH (ref 11.5–14.5)
WBC: 14.9 10*3/uL — ABNORMAL HIGH (ref 3.6–11.0)

## 2017-11-05 LAB — METABOLIC PANEL, COMPREHENSIVE
A-G Ratio: 1 — ABNORMAL LOW (ref 1.1–2.2)
ALT (SGPT): 512 U/L — ABNORMAL HIGH (ref 12–78)
AST (SGOT): 434 U/L — ABNORMAL HIGH (ref 15–37)
Albumin: 3.4 g/dL — ABNORMAL LOW (ref 3.5–5.0)
Alk. phosphatase: 88 U/L (ref 45–117)
Anion gap: 8 mmol/L (ref 5–15)
BUN/Creatinine ratio: 32 — ABNORMAL HIGH (ref 12–20)
BUN: 80 MG/DL — ABNORMAL HIGH (ref 6–20)
Bilirubin, total: 2.2 MG/DL — ABNORMAL HIGH (ref 0.2–1.0)
CO2: 26 mmol/L (ref 21–32)
Calcium: 8.4 MG/DL — ABNORMAL LOW (ref 8.5–10.1)
Chloride: 100 mmol/L (ref 97–108)
Creatinine: 2.52 MG/DL — ABNORMAL HIGH (ref 0.55–1.02)
GFR est AA: 23 mL/min/{1.73_m2} — ABNORMAL LOW (ref >60)
GFR est non-AA: 19 mL/min/{1.73_m2} — ABNORMAL LOW (ref >60)
Globulin: 3.4 g/dL (ref 2.0–4.0)
Glucose: 123 mg/dL — ABNORMAL HIGH (ref 65–100)
Potassium: 5 mmol/L (ref 3.5–5.1)
Protein, total: 6.8 g/dL (ref 6.4–8.2)
Sodium: 134 mmol/L — ABNORMAL LOW (ref 136–145)

## 2017-11-05 LAB — NT-PRO BNP: NT pro-BNP: 19820 PG/ML — ABNORMAL HIGH (ref <125)

## 2017-11-05 LAB — GLUCOSE, POC
Glucose (POC): 192 mg/dL — ABNORMAL HIGH (ref 65–100)
Glucose (POC): 167 mg/dL — ABNORMAL HIGH (ref 65–100)
Glucose (POC): 123 mg/dL — ABNORMAL HIGH (ref 65–100)
Glucose (POC): 128 mg/dL — ABNORMAL HIGH (ref 65–100)
Glucose (POC): 218 mg/dL — ABNORMAL HIGH (ref 65–100)

## 2017-11-05 LAB — MAGNESIUM: Magnesium: 2.5 mg/dL — ABNORMAL HIGH (ref 1.6–2.4)

## 2017-11-05 LAB — PHOSPHORUS: Phosphorus: 5.7 MG/DL — ABNORMAL HIGH (ref 2.6–4.7)

## 2017-11-05 MED ORDER — METOPROLOL SUCCINATE SR 25 MG 24 HR TAB
25 mg | Freq: Every day | ORAL | Status: DC
Start: 2017-11-05 — End: 2017-11-09
  Administered 2017-11-06: 13:00:00 via ORAL

## 2017-11-05 MED ORDER — SILDENAFIL 20 MG TAB
20 mg | Freq: Three times a day (TID) | ORAL | Status: DC
Start: 2017-11-05 — End: 2017-11-09
  Administered 2017-11-06 (×3): via ORAL

## 2017-11-05 MED ORDER — NYSTATIN 100,000 UNIT/G TOPICAL POWDER
100000 unit/gram | Freq: Two times a day (BID) | CUTANEOUS | Status: DC
Start: 2017-11-05 — End: 2017-11-10
  Administered 2017-11-05 – 2017-11-09 (×10): via TOPICAL

## 2017-11-05 MED ORDER — SODIUM CHLORIDE 0.9 % IV
10 mg/mL | INTRAVENOUS | Status: DC
Start: 2017-11-05 — End: 2017-11-10
  Administered 2017-11-05: 23:00:00 via INTRAVENOUS

## 2017-11-05 MED FILL — SILDENAFIL 20 MG TAB: 20 mg | ORAL | Qty: 0.5

## 2017-11-05 MED FILL — PHENYLEPHRINE 10 MG/ML INJECTION: 10 mg/mL | INTRAMUSCULAR | Qty: 3

## 2017-11-05 MED FILL — ONDANSETRON (PF) 4 MG/2 ML INJECTION: 4 mg/2 mL | INTRAMUSCULAR | Qty: 2

## 2017-11-05 MED FILL — DOK PLUS 8.6 MG-50 MG TABLET: ORAL | Qty: 2

## 2017-11-05 MED FILL — IPRATROPIUM-ALBUTEROL 2.5 MG-0.5 MG/3 ML NEB SOLUTION: 2.5 mg-0.5 mg/3 ml | RESPIRATORY_TRACT | Qty: 3

## 2017-11-05 MED FILL — NYAMYC 100,000 UNIT/GRAM TOPICAL POWDER: 100000 unit/gram | CUTANEOUS | Qty: 15

## 2017-11-05 MED FILL — PREDNISONE 5 MG TAB: 5 mg | ORAL | Qty: 2

## 2017-11-05 MED FILL — BD POSIFLUSH NORMAL SALINE 0.9 % INJECTION SYRINGE: INTRAMUSCULAR | Qty: 10

## 2017-11-05 MED FILL — MAPAP (ACETAMINOPHEN) 325 MG TABLET: 325 mg | ORAL | Qty: 2

## 2017-11-05 MED FILL — TOPROL XL 25 MG TABLET,EXTENDED RELEASE: 25 mg | ORAL | Qty: 1

## 2017-11-05 MED FILL — INSULIN LISPRO 100 UNIT/ML INJECTION: 100 unit/mL | SUBCUTANEOUS | Qty: 1

## 2017-11-05 MED FILL — HEALTHYLAX 17 GRAM ORAL POWDER PACKET: 17 gram | ORAL | Qty: 1

## 2017-11-05 MED FILL — ASPIRIN 81 MG TAB, DELAYED RELEASE: 81 mg | ORAL | Qty: 1

## 2017-11-05 NOTE — Progress Notes (Addendum)
0700: Received bedside and verbal shift report from RiverwoodsJonathan, [place].    0730: Assessment complete, see flowsheets for details; afebrile, vitals stable, no complaints of pain/discomfort; NSR, BP stable, A&O x's 4, lungs diminished, on Bi-PAP d/t events overnight, will attempt to trial patient on high-flow for breakfast this morning; last charted void was June 5th at 1600, bladder scanner - 247 this AM; skin is intact, however, very moist in pannus/folds, cleaned with CHG and placed pillow case between pannus folds, will address this further in interdisciplinary rounds.    0900: Per Dr. Vevelyn PatMiksa, change PICC line order to midline for blood draws.    1021: SpO2 69% while eating and sitting up in bed, patient stated she, "felt bad", placed patient back on Bi-PAP 100% FiO2; RT aware.    1130: Assessment complete, see flowsheets for details; afebrile, vitals stable, comfortable on Bi-PAP; bladder scanner showed 254ml.    1230: Placed patient on high-flow nasal cannula for lunch tray.    1330: Placed patient back on Bi-PAP, SpO2 <80, RR >30.    1600: Assessment complete, see flowsheets for details; afebrile, vitals stable, no complaints of pain/discomfort; unable to maintain SpO2 >88 on High-flow for most of the day; poor intake.    1650: Placed patient on High-flow Nasal cannula to have a snack.    1750: Noted DNR order by Dr. Vevelyn PatMiksa, however DNR (pink sheet) was not filled out and signed; Spoke to Dr. Vevelyn PatMiksa via telephone and confirmed that patient is now officially a DNR and she will be by tomorrow morning to sign DNR form.    1900: Gave bedside and verbal shift report to Nancy NordmannWhitney Bennett, Charity fundraiserN.

## 2017-11-05 NOTE — Progress Notes (Signed)
Physical Therapy  ??  Noted RN update at 10:21 that Pts SpO2 decreased to 69% while she was eating and sitting up in bed this morning. Pt was then placed patient back on Bi-PAP (FiO2 100%) and currently remains on BiPAP. Pt politely declining therapy today as she reports not feeling well at all.   ??  Will defer therapy at this time per Pt request and continue to follow.   ??

## 2017-11-05 NOTE — Progress Notes (Signed)
ADULT PROTOCOL: JET AEROSOL  REASSESSMENT    Patient  Brittney Shelton     67 y.o.   female     11/05/2017  9:04 AM    Breath Sounds Pre Procedure: Right Breath Sounds: Diminished, Clear                               Left Breath Sounds: Diminished, Clear    Breath Sounds Post Procedure: Right Breath Sounds: Diminished, Clear                                 Left Breath Sounds: Diminished, Clear    Breathing pattern: Pre procedure Breathing Pattern: Regular          Post procedure Breathing Pattern: Regular    Heart Rate: Pre procedure Pulse: 84           Post procedure Pulse: 84    Resp Rate: Pre procedure Respirations: 20           Post procedure Respirations: 19     Oxygen: O2 Device: Heated, Hi flow nasal cannula  100% 50L    SpO2: Pre procedure SpO2: 98 %                  Post procedure SpO2: 95 %      Nebulizer Therapy: Current medications Aerosolized Medications: DuoNeb       Problem List:   Patient Active Problem List   Diagnosis Code   ??? Well controlled type 2 diabetes mellitus (HCC) E11.9   ??? Hypercholesterolemia E78.00   ??? 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 COPD (chronic obstructive pulmonary disease) (HCC) J44.9   ??? Obesity E66.9   ??? Requires continuous at home supplemental oxygen Z99.81   ??? Dependence on supplemental oxygen Z99.81   ??? ACP (advance care planning) Z71.89   ??? Counseling regarding advanced care planning and goals of care Z71.89   ??? Dyspnea R06.00   ??? Acute on chronic diastolic (congestive) heart failure (HCC) I50.33   ??? Obesity, morbid (HCC) E66.01   ??? 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 Intractable back pain M54.9   ??? Ambulatory dysfunction R26.2   ??? 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 Angioedema T78.3XXA   ??? Acute respiratory failure with hypoxia (HCC) J96.01   ??? Acute respiratory failure with hypoxemia (HCC) J96.01   ??? Cor pulmonale (HCC) I27.81   ??? Pulmonary hypertension (HCC) I27.20        Respiratory Therapist: Dorathy DaftLeslie A Scott

## 2017-11-05 NOTE — Progress Notes (Signed)
[facility]  Name: Brittney LimesMary S Shelton MRN: [id number]   DOB: 07/07/1950 [facility]   Date: 11/05/2017        IMPRESSION:   ?? Acute/chronic hypoxic/hypercapnic respiratory failure; on 4 LPm at home for many yrs [place]; PLACED ON BIPAP AT 3 AM FOR DESAT TO 70'S ON HIGH FLOW .  Cxr does NOT  Show increased edema so fear this  Is her pulm htn.   ?? Community acquired pneumonia rll- completed antibiotic course   ?? "COPD exacerbation" - she has a diagnosis of COPD, but she has no obstruction on PFTs; patient of Dr. Jayme CloudGonzalez  ?? Acute renal failure- numbers increased  But stable   ?? Severe Pulmonary HTN with dilated RV and RV failure on echo; CTA negative for PE; this did not happen overnight  ?? LVH/diastolic heart failure- stable  Volume at present   ?? Obesity - ?OSA/OHS  ?? Constipation, nausea- still an ongoing problem- nausea is chieg   ?? Elevated lft's.   .  [place] showed hepatomegly and dilated portal vein with slow flow  and biliary sludge and gb wall thickening. -? Related to hepatic congestion vs other . Was seen by Gi  np yesterday.  Abd is non- tender today .   ?? DROP  IN PLATELETS- was  lovenox 40 mg bid for prophylaxis.  Lovenox held and hit / serotonin studies sent 2 days ago are still pd - stable today at 111k  ?? Dm       PLAN:   ?? CCU  Monitoring - still at risk for decline and no wiggle room resp wise   ?? Keep prednisone at same dose for now. Wean to 10 mg.   ?? See if we can get to high flow again today   ?? She has not been stable enough  To go to v/q  Scanner from a  resp standpoint and  Therefore is not stable enough for rt heart cath at this time.   ?? Will ask PICC team to assess for PICC line versus, Midline. - we do not have access for lab draws so AM LABS FOR jUNE 6 HAS NOT YET BEEN DONE   ?? Eventually needs diagnostic RHC, vasodilator trial - per report cards  here would prefer this be done  By Dr. Fraser DinJOhri   ?? If she declines, will need intubation  ?? Incentive spirometer  ?? Bronchodilators - adjust to day time - done   ?? Now off ab   ?? Follow up cultures- done  And neg   ?? Diurese daily-  Is on hold - am renal labs pd   ?? cxr and labs  Daily   ?? HIT AND SEROTONIN PANEL - HOLD LOVENOX - DISCUSSED WITH PHARMACIST AND RN   ?? scd  ?? Will eventually need sleep study  After dc   ??   ?? Insulin   ?? DVT prophyaxis  ?? Slowly advance diet as tolerated - up  And down   ?? Miralax , Zofran  ?? Condition remains guarded.  Not yet stable enough to undergo v/q or additional testing out of icu.   ?? Check liver labs daily   ?? ? Gi eval if lft's continue to  Jump   ?? Ongoing evaluations and treatments.   ?? WILL UPDATE DR Fraser DinJOHRI WHO HAS BEEN ASSISTING US WITH CARE. ? SILDENAFIL OR INHALED NITRIC IN VIEW  OF HER TENUOUS RESP STATUS - WILL ADVISE FURTHER AFTER TALKING WITH HER .      Subjective/Interval History:   I have reviewed the flowsheet and previous day???s notes.    11-04-17: Pt feels tired, weak this am. Has fatigue. She was feeling nauseated and sick to her stomach. No chest pain, no back pain, no abdominal pain, no leg pain.  Has poor appetite this am.       5/30 HFNC 45 LPM. OOB the other day. Slept without BIPAP. ROS otherwise negative    5/29 BIPAP then HFNC this AM. sats hovering close to 88%- 90%. No congestion. No fver. I > O since admission. No preipheral edema. CXr reviewed- no effusion    5/28 No acute events overnight.On bipap.   No severe distress    6/3 - was sleeping without any resp distress when I came in. As can best tell was on high flow overnight.   6/4- says breathing is ok . Admits to rlq discomfort on direst palp - no rebound   6/6 - on bipap . No  resp complaints on bipap and no abd tenderness  But does have nausea.     Objective:  Vital Signs:    Visit Vitals  BP 99/63   Pulse 79   Temp 97.6 ??F (36.4 ??C)   Resp 21   Ht 5\' 6"  (1.676 m)   Wt 135.1 kg (297 lb 13.5 oz)    SpO2 99%   BMI 48.07 kg/m??       O2 Device: BIPAP   O2 Flow Rate (L/min): 50 l/min   Temp (24hrs), Avg:97.8 ??F (36.6 ??C), Min:97.6 ??F (36.4 ??C), Max:98.7 ??F (37.1 ??C)     Intake/Output:   Last shift:      No intake/output data recorded.  Last 3 shifts: 06/04 1901 - 06/06 0700  In: 1020 [P.O.:1020]  Out: 650 [Urine:650]    Intake/Output Summary (Last 24 hours) at 11/05/2017 0813  Last data filed at 11/05/2017 0400  Gross per 24 hour   Intake 1020 ml   Output 350 ml   Net 670 ml     Physical exam  Gen: morbidly obese.  no distress  On bipap   heent - bipap  Neck -  nontender   cardiac - rrr , sinus   Lungs- minimal rales in bases posterior     abd - soft  And no tenderness   Ext-   Large with trace edema of bilateral lower extremities.    neuro - appears alert and  Oriented, seems grossly weak.    Psych: nonfocal. NO overt depression, no anxiety.     Data:     Current Facility-Administered Medications   Medication Dose Route Frequency   ??? albuterol-ipratropium (DUO-NEB) 2.5 MG-0.5 MG/3 ML  3 mL Nebulization QID RT   ??? predniSONE (DELTASONE) tablet 10 mg  10 mg Oral DAILY WITH BREAKFAST   ??? senna-docusate (PERICOLACE) 8.6-50 mg per tablet 2 Tab  2 Tab Oral BID   ??? polyethylene glycol (MIRALAX) packet 17 g  17 g Oral DAILY   ??? metoprolol succinate (TOPROL-XL) XL tablet 25 mg  25 mg Oral DAILY   ??? insulin lispro (HUMALOG) injection   SubCUTAneous AC&HS   ??? sodium chloride (NS) flush 5-40 mL  5-40 mL IntraVENous Q8H   ??? aspirin delayed-release tablet 81 mg  81 mg Oral DAILY                Labs:  Recent  Labs     11/04/17  1723 11/03/17  0517   WBC 11.1* 11.3*   HGB 13.2 12.9   HCT 41.1 39.8   PLT 111* 109*     Recent Labs     11/04/17  1723 11/03/17  0830 11/03/17  0652 11/02/17  0844   NA 137  --  137 136   K 4.8  --  4.8 4.9   CL 100  --  102 103   CO2 26  --  27 27   GLU 179*  --  115* 119*   BUN 73*  --  65* 53*   CREA 2.14*  --  2.14* 1.94*   CA 8.5  --  8.4* 8.5   MG 2.5*  --  2.5*  --    PHOS 4.9*  --  5.3*  --     ALB 3.6  --   --  3.3*   TBILI 2.0* 2.2*  --  2.3*   SGOT 151* 200*  --  178*   ALT 288* 246*  --  149*     No results for input(s): PHI, PCO2I, PO2I, HCO3I, FIO2I in the last 72 hours.  Imaging:  I have personally reviewed the patient???s radiographs and have reviewed the reports:  cxr portable: 11-05-17: IMPRESSION:  ?? no overt decompensated chf - mild increased markings  On portable film.           Caro Laroche, MD

## 2017-11-05 NOTE — Progress Notes (Signed)
GI PROGRESS NOTE   Jaquita RectorKayleigh Ansleigh Safer, [phone] NP in-hospital cell phone M-F until 4:30  After 5pm or on weekends, please call operator for physician on call  NAME:Brittney Shelton DOB:1950/06/26 WJX:[id number]   ATTG: Dr. Jodie Echevariaan  PCP: Erenest Rasheromah, Nau D, MD  Date/Time:  11/05/2017 9:03 AM     Primary GI: Dr. Sandie Anouckworth    Reason for following: Elevated LFTs    Assessment:   -Elevated LFTs  ?? WNL on admission, have spiked ALT/AST512/434, Tbili 2.2 yesterday, no new labs to review today  ?? Abdominal ultrasound showing hepatomegaly, mildly dilated portal vein with slow flow, biliary sludge with mild gallbladder wall thickening.  ?? Just completed levaquin for RLL pneumonia  ?? Likely result of respiratory and cardiac status, possible has had some decreased blood flow to liver, not surprising haven't gone down given worsening renal and respiratory function. Has had some soft blood pressures  -Thrombocytopenia, 122 stable  ?? 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 Continuing to worsen, increase BLE edema, no urine today, per nurse 250cc in bladder during scan  -Constipation, managed by primary team, on miralax daily and pericolace BID  -Respiratory failure, hypoxia  ?? Worsening respiratory status, desat easily with minimal exertion, spent most of day on Bipap, comes off for meals and has frequent desaturations  -COPD  -Pulmonary HTN  -CHF     Plan:   -No plan for endoscopic intervention at this time. No changes to plan at this time.  -Elevated LFTs likely related to pulmonary/cardiac status/exacerbations. Likely LFTs will downtrend with medical optimization. No h/o liver disease in past. Continued rise likely   -Avoid hepatotoxic agents  -Supportive measures as clinically indicated.   -Daily LFTs for trending  -Continue with constipation regimen  -Will continue to follow  *Plan discussed with Dr. Sandie Anouckworth     Subjective:   Patient resting in bed during visit. Unable to really interview patient  complete as is being switched from high flow NC to bipap for desaturation and SOB.     Per bedside RN, patient has fragile respiratory status, has been on bipap most of the day. Will go on high flow nasal cannula for  Meals, but can only tolerate that for about 20-30 min before needing to go back on bipap. Per nurse has not had any urine, was bladder scanned for 250cc. Patient's swelling in BLE increasing.     Complaint Y/N Description   Abdominal Pain N    Hematemesis     Hematochezia     Melena     Constipation     Diarrhea     Dyspepsia     Dysphagia     Jaundiced     Nausea/vomiting ?      Review of Systems:  Symptom Y/N Comments  Symptom Y/N Comments   Fever/Chills    Chest Pain     Cough    Headaches     Sputum    Joint Pain     SOB/DOE    Pruritis/Rash     Tolerating Diet Y Diabetic consistent carb  Other       Could NOT obtain due to:      Objective:   VITALS:   Last 24hrs VS reviewed since prior progress note. Most recent are:  Visit Vitals  BP 113/60 (BP 1 Location: Right arm, BP Patient Position: At rest)   Pulse 86   Temp 97.6 ??F (36.4 ??C)   Resp 21   Ht 5\' 6"  (1.676  m)   Wt 135.1 kg (297 lb 13.5 oz)   SpO2 99%   BMI 48.07 kg/m??       Intake/Output Summary (Last 24 hours) at 11/05/2017 1642  Last data filed at 11/05/2017 1330  Gross per 24 hour   Intake 830 ml   Output ???   Net 830 ml     PHYSICAL EXAM:  General: WD, WN. Alert, cooperative, no acute distress????  HEENT: NC, Atraumatic. Anicteric sclerae.  Lungs:  CTA Bilaterally. No Wheezing/Rhonchi/Rales.  Heart:  Regular  rhythm,?? No murmur (-), No Rubs, No Gallops  Abdomen: Soft, Non distended, Non tender. ??+Bowel sounds, no HSM  Extremities: No c/c. BLE +2 pitting  Neurologic:?? Alert and oriented X 3.  No acute neurological distress   Psych:???? Not anxious nor agitated.    Lab and Radiology Data Reviewed: (see below)    Medications Reviewed: (see below)  PMH/SH reviewed - no change compared to H&P   ________________________________________________________________________  Total time spent with patient: 30 minutes ________________________________________________________________________  Care Plan discussed with:  Patient Y   Family     RN               Consultant:       Jaquita Rector, NP     Procedures: see electronic medical records for all procedures/Xrays and details which were not copied into this note but were reviewed prior to creation of Plan.      LABS:  Recent Labs     11/05/17  0936 11/04/17  1723   WBC 14.9* 11.1*   HGB 12.9 13.2   HCT 40.5 41.1   PLT 122* 111*     Recent Labs     11/05/17  0936 11/04/17  1723 11/03/17  0652   NA 134* 137 137   K 5.0 4.8 4.8   CL 100 100 102   CO2 26 26 27    BUN 80* 73* 65*   CREA 2.52* 2.14* 2.14*   GLU 123* 179* 115*   CA 8.4* 8.5 8.4*   MG 2.5* 2.5* 2.5*   PHOS 5.7* 4.9* 5.3*     Recent Labs     11/05/17  0936 11/04/17  1723 11/03/17  0830   SGOT 434* 151* 200*   AP 88 93  --    TP 6.8 7.2  --    ALB 3.4* 3.6  --    GLOB 3.4 3.6  --      No results for input(s): INR, PTP, APTT in the last 72 hours.    No lab exists for component: INREXT, INREXT   No results for input(s): FE, TIBC, PSAT, FERR in the last 72 hours.   No results found for: FOL, RBCF  No results for input(s): PH, PCO2, PO2 in the last 72 hours.  No results for input(s): CPK, CKMB in the last 72 hours.    No lab exists for component: TROPONINI  Lab Results   Component Value Date/Time    Color YELLOW/STRAW 10/23/2017 01:28 PM    Appearance CLEAR 10/23/2017 01:28 PM    Specific gravity 1.010 10/23/2017 01:28 PM    Specific gravity 1.010 12/30/2015 11:18 PM    pH (UA) 5.0 10/23/2017 01:28 PM    Protein NEGATIVE  10/23/2017 01:28 PM    Glucose NEGATIVE  10/23/2017 01:28 PM    Ketone NEGATIVE  10/23/2017 01:28 PM    Bilirubin NEGATIVE  10/23/2017 01:28 PM    Urobilinogen 0.2 10/23/2017 01:28 PM  Nitrites NEGATIVE  10/23/2017 01:28 PM    Leukocyte Esterase NEGATIVE  10/23/2017 01:28 PM     Epithelial cells FEW 10/23/2017 01:28 PM    Bacteria NEGATIVE  10/23/2017 01:28 PM    WBC 0-4 10/23/2017 01:28 PM    RBC 0-5 10/23/2017 01:28 PM       MEDICATIONS:  Current Facility-Administered Medications   Medication Dose Route Frequency   ??? nystatin (MYCOSTATIN) 100,000 unit/gram powder   Topical BID   ??? [START ON 11/06/2017] metoprolol succinate (TOPROL-XL) XL tablet 12.5 mg  12.5 mg Oral DAILY   ??? albuterol-ipratropium (DUO-NEB) 2.5 MG-0.5 MG/3 ML  3 mL Nebulization QID RT   ??? predniSONE (DELTASONE) tablet 10 mg  10 mg Oral DAILY WITH BREAKFAST   ??? senna-docusate (PERICOLACE) 8.6-50 mg per tablet 2 Tab  2 Tab Oral BID   ??? artificial tears (dextran 70-hypromellose) (NATURAL BALANCE) 0.1-0.3 % ophthalmic solution 1 Drop  1 Drop Both Eyes PRN   ??? polyethylene glycol (MIRALAX) packet 17 g  17 g Oral DAILY   ??? dextrose 10% infusion 125-250 mL  125-250 mL IntraVENous PRN   ??? insulin lispro (HUMALOG) injection   SubCUTAneous AC&HS   ??? sodium chloride (NS) flush 5-40 mL  5-40 mL IntraVENous Q8H   ??? sodium chloride (NS) flush 5-40 mL  5-40 mL IntraVENous PRN   ??? acetaminophen (TYLENOL) tablet 650 mg  650 mg Oral Q6H PRN   ??? naloxone (NARCAN) injection 0.4 mg  0.4 mg IntraVENous PRN   ??? ondansetron (ZOFRAN) injection 4 mg  4 mg IntraVENous Q4H PRN   ??? bisacodyl (DULCOLAX) suppository 10 mg  10 mg Rectal DAILY PRN   ??? aspirin delayed-release tablet 81 mg  81 mg Oral DAILY   ??? glucose chewable tablet 16 g  4 Tab Oral PRN   ??? glucagon (GLUCAGEN) injection 1 mg  1 mg IntraMUSCular PRN

## 2017-11-05 NOTE — Progress Notes (Signed)
[facility]  Name: Brittney Shelton MRN: [id number]   DOB: 1951-02-08 [facility]   Date: 11/05/2017        IMPRESSION:   ?? Acute/chronic hypoxic/hypercapnic respiratory failure; on 4 LPm at home for many yrs since she lived in [place]; PLACED ON BIPAP AT 3 AM FOR DESAT TO 70'S ON HIGH FLOW .  Cxr does NOT  Show increased edema so fear this  Is her pulm htn.   ?? Community acquired pneumonia rll- completed antibiotic course   ?? "COPD exacerbation" - she has a diagnosis of COPD, but she has no obstruction on PFTs; patient of Dr. Jayme CloudGonzalez  ?? Acute renal failure- numbers increased  But stable   ?? Severe Pulmonary HTN with dilated RV and RV failure on echo; CTA negative for PE; this did not happen overnight  ?? LVH/diastolic heart failure- stable  Volume at present   ?? Obesity - ?OSA/OHS  ?? Constipation, nausea- still an ongoing problem- nausea is chieg   ?? Elevated lft's.   .  [place] showed hepatomegly and dilated portal vein with slow flow  and biliary sludge and gb wall thickening. -? Related to hepatic congestion vs other . Was seen by Gi  np yesterday.  Abd is non- tender today .   ?? DROP  IN PLATELETS- was  lovenox 40 mg bid for prophylaxis.  Lovenox held and hit / serotonin studies sent 2 days ago are still pd - stable today at 111k  ?? Dm   ?? Discussed case with Dr. Fraser DinJOhri and Dr. Modesto CharonWong. Dr. Fraser DinJOhri  Has reviewed the chart and we discussed  The fact that she appears to have made no progress and actually is more bipap dependent now than she was earlier in the week.  Dr. Fraser DinJOhri thinks pt  May have end stage primary pulm htn.  There may be little we can do .  NOt able to have rt heart cath.  Dr. Fraser DinJOhri says it is ok to try sildenafil but do  So at a smaller starting dose of 10 mg.  Will start tonight and if she drops her bp will stop it.   ?? I have a frank , detailed conversation with pt and her sister  In law.   I told her what we think is going on and that we are likely not going to be able to reverse this.  We discussed risks and possible benefit of siledenafil and what will likely be the outcome no matter what we do.  She may not survive this.  She appeared to understand.  She was ok with trial of low dose sildenafil.  IN the presence of her sister in law we discussed code status and she will be a  Full no code at this point.  NO shock, cpr , vent or code drug.  cAn use neo if she drops her bp on sildenafil but would not continue the trial if she does  So.  Prognosis is guarded.  Spent  35 minutes in conversation with pt and and sister in  Law regarding situation and advanced care / code status option.  May consider hospice   If she gets no better.  Have held on consult for today to allow her some time to think and  For us to see how she does.       PLAN:   ?? CCU  Monitoring - still at risk for decline  and no wiggle room resp wise   ?? Keep prednisone at same dose for now. Wean to 10 mg.   ?? See if we can get to high flow again today   ?? She has not been stable enough  To go to v/q  Scanner from a  resp standpoint and  Therefore is not stable enough for rt heart cath at this time.   ?? Will ask PICC team to assess for PICC line versus, Midline. - we do not have access for lab draws so AM LABS FOR jUNE 6 HAS NOT YET BEEN DONE   ?? Eventually needs diagnostic RHC, vasodilator trial - per report cards here would prefer this be done  By Dr. Fraser Din   ?? If she declines, will need intubation  ?? Incentive spirometer  ?? Bronchodilators - adjust to day time - done   ?? Now off ab   ?? Follow up cultures- done  And neg   ?? Diurese daily-  Is on hold - am renal labs pd   ?? cxr and labs  Daily   ?? HIT AND SEROTONIN PANEL - HOLD LOVENOX - DISCUSSED WITH PHARMACIST AND RN   ?? scd  ?? Will eventually need sleep study  After dc   ??   ?? Insulin   ?? DVT prophyaxis  ?? Slowly advance diet as tolerated - up  And down   ?? Miralax , Zofran   ?? Condition remains guarded.  Not yet stable enough to undergo v/q or additional testing out of icu.   ?? Check liver labs daily   ?? ? Gi eval if lft's continue to  Jump   ?? Ongoing evaluations and treatments.   ?? WILL UPDATE DR Fraser Din WHO HAS BEEN ASSISTING [place] WITH CARE. ? SILDENAFIL OR INHALED NITRIC IN VIEW OF HER TENUOUS RESP STATUS - WILL ADVISE FURTHER AFTER TALKING WITH HER .      Subjective/Interval History:   I have reviewed the flowsheet and previous day???s notes.    11-04-17: Pt feels tired, weak this am. Has fatigue. She was feeling nauseated and sick to her stomach. No chest pain, no back pain, no abdominal pain, no leg pain.  Has poor appetite this am.       5/30 HFNC 45 LPM. OOB the other day. Slept without BIPAP. ROS otherwise negative    5/29 BIPAP then HFNC this AM. sats hovering close to 88%- 90%. No congestion. No fver. I > O since admission. No preipheral edema. CXr reviewed- no effusion    5/28 No acute events overnight.On bipap.   No severe distress    6/3 - was sleeping without any resp distress when I came in. As can best tell was on high flow overnight.   6/4- says breathing is ok . Admits to rlq discomfort on direst palp - no rebound   6/6 - on bipap . No  resp complaints on bipap and no abd tenderness  But does have nausea.     Objective:  Vital Signs:    Visit Vitals  BP 108/69 (BP 1 Location: Right arm, BP Patient Position: At rest)   Pulse 84   Temp 97.5 ??F (36.4 ??C)   Resp 24   Ht 5\' 6"  (1.676 m)   Wt 135.1 kg (297 lb 13.5 oz)   SpO2 98%   BMI 48.07 kg/m??       O2 Device: Hi flow nasal cannula   O2 Flow Rate (L/min): 50 l/min  Temp (24hrs), Avg:97.7 ??F (36.5 ??C), Min:97.5 ??F (36.4 ??C), Max:98 ??F (36.7 ??C)     Intake/Output:   Last shift:      06/06 0701 - 06/06 1900  In: 290 [P.O.:290]  Out: -   Last 3 shifts: 06/04 1901 - 06/06 0700  In: 1020 [P.O.:1020]  Out: 650 [Urine:650]    Intake/Output Summary (Last 24 hours) at 11/05/2017 1731  Last data filed at 11/05/2017 1330  Gross per 24 hour    Intake 830 ml   Output ???   Net 830 ml     Physical exam  Gen: morbidly obese.  no distress  On bipap   heent - bipap  Neck -  nontender   cardiac - rrr , sinus   Lungs- minimal rales in bases posterior     abd - soft  And no tenderness   Ext-   Large with trace edema of bilateral lower extremities.    neuro - appears alert and  Oriented, seems grossly weak.    Psych: nonfocal. NO overt depression, no anxiety.     Data:     Current Facility-Administered Medications   Medication Dose Route Frequency   ??? nystatin (MYCOSTATIN) 100,000 unit/gram powder   Topical BID   ??? [START ON 11/06/2017] metoprolol succinate (TOPROL-XL) XL tablet 12.5 mg  12.5 mg Oral DAILY   ??? sildenafil (antihypertensive) (REVATIO) tablet 10 mg  10 mg Oral TID   ??? PHENYLephrine (NEO-SYNEPHRINE) 30 mg in 0.9% sodium chloride 250 mL infusion  10-100 mcg/min IntraVENous TITRATE   ??? albuterol-ipratropium (DUO-NEB) 2.5 MG-0.5 MG/3 ML  3 mL Nebulization QID RT   ??? predniSONE (DELTASONE) tablet 10 mg  10 mg Oral DAILY WITH BREAKFAST   ??? senna-docusate (PERICOLACE) 8.6-50 mg per tablet 2 Tab  2 Tab Oral BID   ??? polyethylene glycol (MIRALAX) packet 17 g  17 g Oral DAILY   ??? insulin lispro (HUMALOG) injection   SubCUTAneous AC&HS   ??? sodium chloride (NS) flush 5-40 mL  5-40 mL IntraVENous Q8H   ??? aspirin delayed-release tablet 81 mg  81 mg Oral DAILY                Labs:  Recent Labs     11/05/17  0936 11/04/17  1723 11/03/17  0517   WBC 14.9* 11.1* 11.3*   HGB 12.9 13.2 12.9   HCT 40.5 41.1 39.8   PLT 122* 111* 109*     Recent Labs     11/05/17  0936 11/04/17  1723 11/03/17  0830 11/03/17  0652   NA 134* 137  --  137   K 5.0 4.8  --  4.8   CL 100 100  --  102   CO2 26 26  --  27   GLU 123* 179*  --  115*   BUN 80* 73*  --  65*   CREA 2.52* 2.14*  --  2.14*   CA 8.4* 8.5  --  8.4*   MG 2.5* 2.5*  --  2.5*   PHOS 5.7* 4.9*  --  5.3*   ALB 3.4* 3.6  --   --    TBILI 2.2* 2.0* 2.2*  --    SGOT 434* 151* 200*  --    ALT 512* 288* 246*  --       No results for input(s): PHI, PCO2I, PO2I, HCO3I, FIO2I in the last 72 hours.  Imaging:  I have personally reviewed the patient???s radiographs and have  reviewed the reports:  cxr portable: 11-05-17: IMPRESSION:  ?? no overt decompensated chf - mild increased markings  On portable film.           Caro Laroche, MD

## 2017-11-05 NOTE — Other (Signed)
Critical care interdisciplinary rounds held on 11/05/2017.  Following members present, Pharmacy, Diabetes Treatment, Case Management,and Nutrition.  Led by Floreen ComberE. Mass, RN and Dr. Modesto CharonWong.  Plan of care discussed.  See clinical pathway for plan of care and interventions and desired outcomes.  Medications adjusted.

## 2017-11-05 NOTE — Progress Notes (Signed)
OT Note    Noted RN update at 10:21 that Pts SpO2 decreased to 69% while she was eating and sitting up in bed this morning. Pt was then placed patient back on Bi-PAP (FiO2 100%) and currently remains on BiPAP. Pt politely declining therapy today as she reports not feeling well at all.     Will defer therapy at this time per Pt request and continue to follow.     SP[employer] CorporationWhitney Cherry, OTR/L

## 2017-11-05 NOTE — Progress Notes (Addendum)
1900 [facility]    2000 Assessment complete.     2100 Patient placed on Hi flow to take a break from BIPAP.    2130 Patient placed back on BIPAP. Oxygen sats dropped to 78%.    2245 Report given to ColgateCrystal Redmond, RN

## 2017-11-05 NOTE — Progress Notes (Signed)
Hospitalist Progress Note  NAME: Brittney Shelton   DOB:  December 01, 1950   MRN:  [id number]       Assessment / Plan:    Acute on chronic respiratory failure with hypoxia POA (baseline 4LNC)  Acute on chronic diastolic HF POA  Cor pulmonale POA due to severe pulmonary hypertension   Right lower lobe pneumonia  COPD exacerbation POA  -remains on high flow oxygen; desaturating easily with minimal exertion   -continues to need BIPAP prn   -completed Levaquin   -continue low dose prednisone  -scheduled duonebs  -diurese prn  -started on sildenafil today.  Appreciate Dr Cathlean Marseilles help.  Prognosis guarded and she may not tolerated sildenafil.    ??  Acute kidney injury POA  -last normal Cr 09/2017,  -monitor closely. Avoid nephrotoxic drugs, adjust all meds to GFR.   ??  Essential HTN POA  -cont BB/  -Holding Norvasc/ Imdur   ??  DM type 2 POA  Diet controlled at home  Last HgBa1c 6.4 in Dec 2018  Hyperglycemic on steroids; refusing insulin most of the time    SSI prn  ??  Right pleural effusion POA  Small, doubt thoracentesis would be helpful  ??  Nausea/vomiting  Constipation resolved   Elevated LFTs: r/o biliary colic   -KUB 6/2 normal   -Abd [place] Hepatomegaly.  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  Biliary sludge with mild gallbladder wall thickening.    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    Code status: Full  Prophylaxis: Hep SQ     Recommended Disposition: remain in ICU      Subjective:     Chief Complaint / Reason for Physician Visit:   follow up respiratory failure/ copd   Discussed with RN events overnight.   Started on BIPAP      Review of Systems:  Symptom Y/N Comments  Symptom Y/N Comments   Fever/Chills n   Chest Pain n    Poor Appetite    Edema     Cough    Abdominal Pain n    Sputum    Joint Pain     SOB/DOE    Pruritis/Rash      Nausea/vomit n   Tolerating PT/OT     Diarrhea n   Tolerating Diet     Constipation n   Other       Could NOT obtain due to:      Objective:     VITALS:   Last 24hrs VS reviewed since prior progress note. Most recent are:  Patient Vitals for the past 24 hrs:   Temp Pulse Resp BP SpO2   11/05/17 2000 98.2 ??F (36.8 ??C) 78 25 118/61 97 %   11/05/17 1900 ??? 81 23 110/60 98 %   11/05/17 1800 ??? 80 22 108/65 96 %   11/05/17 1700 ??? 82 20 97/60 92 %   11/05/17 1600 97.5 ??F (36.4 ??C) 84 24 108/69 98 %   11/05/17 1543 ??? ??? ??? ??? 99 %   11/05/17 1500 ??? 84 23 91/62 98 %   11/05/17 1430 ??? 84 24 103/59 99 %   11/05/17 1400 ??? 81 20 ??? 96 %   11/05/17 1300 ??? 82 21 101/58 92 %  11/05/17 1200 ??? 86 21 113/60 97 %   11/05/17 1130 97.6 ??F (36.4 ??C) 80 22 ??? 97 %   11/05/17 1114 ??? ??? ??? ??? 97 %   11/05/17 1100 ??? 80 21 119/63 96 %   11/05/17 1021 ??? 83 (!) 31 ??? (!) 69 %   11/05/17 1000 ??? 85 27 111/72 (!) 88 %   11/05/17 0900 ??? 83 21 98/84 91 %   11/05/17 0848 ??? ??? ??? ??? 93 %   11/05/17 0842 ??? 81 ??? 107/66 ???   11/05/17 0800 ??? 79 21 99/63 99 %   11/05/17 0747 ??? ??? ??? ??? 98 %   11/05/17 0730 97.6 ??F (36.4 ??C) 83 23 ??? 99 %   11/05/17 0700 ??? 80 21 94/62 99 %   11/05/17 0600 ??? 81 20 108/69 98 %   11/05/17 0500 ??? 82 21 109/69 97 %   11/05/17 0400 97.6 ??F (36.4 ??C) 84 23 97/77 97 %   11/05/17 0325 ??? 86 26 ??? 98 %   11/05/17 0200 ??? 85 24 105/67 (!) 88 %   11/05/17 0105 ??? 76 (!) 33 (!) 81/47 (!) 75 %   11/05/17 0002 97.7 ??F (36.5 ??C) 90 25 91/53 91 %   11/04/17 2300 ??? 90 27 100/66 (!) 85 %       Intake/Output Summary (Last 24 hours) at 11/05/2017 2251  Last data filed at 11/05/2017 2000  Gross per 24 hour   Intake 650 ml   Output ???   Net 650 ml        PHYSICAL EXAM:  General: WD, WN. Alert, cooperative, no acute distress, morbidly obese  EENT:  EOMI. Anicteric sclerae. MMM  Resp:  Exam limited by habitus, no wheezing or rales  CV:  Regular  Rhythm,??+ edema  GI:  Soft, Non distended, Non tender. ??+Bowel sounds  Neurologic:?? Alert and oriented X 3, normal speech,    Psych:???? Fair insight.??Not anxious nor agitated    Reviewed most current lab test results and cultures  YES  Reviewed most current radiology test results   YES  Review and summation of old records today    NO  Reviewed patient's current orders and MAR    YES  PMH/SH reviewed - no change compared to H&P  ________________________________________________________________________  Care Plan discussed with:    Comments   Patient y    Family      RN y    Buyer, retailCare Manager     Consultant                        Multidiciplinary team rounds were held today with case manager, nursing, pharmacist and Higher education careers adviserclinical coordinator.  Patient's plan of care was discussed; medications were reviewed and discharge planning was addressed.     ________________________________________________________________________  Total NON critical care TIME:  25  Minutes    Total CRITICAL CARE TIME Spent:   Minutes non procedure based      Comments   >50% of visit spent in counseling and coordination of care     ________________________________________________________________________  Pamalee LeydenJoseph L Annastasia Haskins, MD     Procedures: see electronic medical records for all procedures/Xrays and details which were not copied into this note but were reviewed prior to creation of Plan.      LABS:  I reviewed today's most current labs and imaging studies.  Pertinent labs include:  Recent Labs     11/05/17  0936 11/04/17  1723 11/03/17  0517   WBC 14.9* 11.1* 11.3*   HGB 12.9 13.2 12.9   HCT 40.5 41.1 39.8   PLT 122* 111* 109*     Recent Labs     11/05/17  0936 11/04/17  1723 11/03/17  0830 11/03/17  0652   NA 134* 137  --  137   K 5.0 4.8  --  4.8   CL 100 100  --  102   CO2 26 26  --  27   GLU 123* 179*  --  115*   BUN 80* 73*  --  65*   CREA 2.52* 2.14*  --  2.14*   CA 8.4* 8.5  --  8.4*   MG 2.5* 2.5*  --  2.5*   PHOS 5.7* 4.9*  --  5.3*   ALB 3.4* 3.6  --   --    TBILI 2.2* 2.0* 2.2*  --    SGOT 434* 151* 200*  --    ALT 512* 288* 246*  --        Signed: Pamalee Leyden, MD

## 2017-11-05 NOTE — Progress Notes (Signed)
Midline insertion procedure note:  Pt has very limited vascular access. Procedure explained to patient along with risks and benefits. Procedure teaching completed. Pre procedure assessment done. Patient denies questions or concerns at this time.  Midline information booklet.   [facility]    Maximum sterile barrier precautions observed throughout procedure.   Lidocaine 1% 3ml sc injected to site prior to access the vein.  Cannulated Brachial vein using ultrasound guidance.    Inserted 4 Fr. single lumen midline to Left  arm.   Blood return verified and flushed with 20ml normal saline.  Sterile dressing applied with Biopatch, StatLock and occlusive dressing as per protocol. Curos caps applied to port.   Patient tolerated procedure well with minimal blood loss.   Reason for access : Reliable IV access  Complications related to insertion : None    See nursing message on Kardex for midline reminders.  Midline is CT and MRI compatible.  Inserted by : Sherryle LisSherry Cannon Quinton, RN [facility] / Vascular Access Nurse  Assisted by : Tanya Nonesiffany Provo,RN, [facility] / Vascular Access Nurse  Catheter Length : 15 cm   External Length : 0cm   Left arm circumference : 51cm  Catheter occupies 38% of vein.  Type of Midline: BARD  Ref#: [id number]  Lot#: [id number]  Expiration Date:01/30/2019  Informed primary nurse Irving BurtonEmily, RN midline is ready for use and to hang new infusion tubing prior to use.    Sherryle LisSherry Sherrelle Prochazka, RN, [facility]  Vascular Access Nurse

## 2017-11-06 ENCOUNTER — Inpatient Hospital Stay: Admit: 2017-11-06 | Payer: MEDICARE | Primary: Internal Medicine

## 2017-11-06 LAB — GLUCOSE, POC
Glucose (POC): 129 mg/dL — ABNORMAL HIGH (ref 65–100)
Glucose (POC): 114 mg/dL — ABNORMAL HIGH (ref 65–100)
Glucose (POC): 158 mg/dL — ABNORMAL HIGH (ref 65–100)
Glucose (POC): 162 mg/dL — ABNORMAL HIGH (ref 65–100)

## 2017-11-06 LAB — SEROTONIN RELEASE ASSAY
Unfrac Heparin High Dose: <1 % (ref 0–20)
Unfrac Heparin Low Dose: <1 % (ref 0–20)

## 2017-11-06 MED ORDER — PATIROMER CALCIUM SORBITEX 8.4 GRAM ORAL POWDER PACKET
8.4 gram | Freq: Every day | ORAL | Status: DC
Start: 2017-11-06 — End: 2017-11-10
  Administered 2017-11-06 – 2017-11-09 (×3): via ORAL

## 2017-11-06 MED ORDER — HEPARIN (PORCINE) 5,000 UNIT/ML IJ SOLN
5000 unit/mL | Freq: Two times a day (BID) | INTRAMUSCULAR | Status: DC
Start: 2017-11-06 — End: 2017-11-10
  Administered 2017-11-07 – 2017-11-10 (×8): via SUBCUTANEOUS

## 2017-11-06 MED ORDER — CALCIUM ACETATE 667 MG CAP
667 mg | Freq: Three times a day (TID) | ORAL | Status: DC
Start: 2017-11-06 — End: 2017-11-10
  Administered 2017-11-06 – 2017-11-10 (×8): via ORAL

## 2017-11-06 MED ORDER — AMLODIPINE 5 MG TAB
5 mg | ORAL_TABLET | ORAL | 0 refills | Status: AC
Start: 2017-11-06 — End: ?

## 2017-11-06 MED FILL — PREDNISONE 5 MG TAB: 5 mg | ORAL | Qty: 2

## 2017-11-06 MED FILL — TOPROL XL 25 MG TABLET,EXTENDED RELEASE: 25 mg | ORAL | Qty: 1

## 2017-11-06 MED FILL — HEALTHYLAX 17 GRAM ORAL POWDER PACKET: 17 gram | ORAL | Qty: 1

## 2017-11-06 MED FILL — BD POSIFLUSH NORMAL SALINE 0.9 % INJECTION SYRINGE: INTRAMUSCULAR | Qty: 10

## 2017-11-06 MED FILL — IPRATROPIUM-ALBUTEROL 2.5 MG-0.5 MG/3 ML NEB SOLUTION: 2.5 mg-0.5 mg/3 ml | RESPIRATORY_TRACT | Qty: 3

## 2017-11-06 MED FILL — REVATIO 20 MG TABLET: 20 mg | ORAL | Qty: 1

## 2017-11-06 MED FILL — ASPIRIN 81 MG TAB, DELAYED RELEASE: 81 mg | ORAL | Qty: 1

## 2017-11-06 MED FILL — CALCIUM ACETATE 667 MG CAP: 667 mg | ORAL | Qty: 1

## 2017-11-06 MED FILL — INSULIN LISPRO 100 UNIT/ML INJECTION: 100 unit/mL | SUBCUTANEOUS | Qty: 1

## 2017-11-06 MED FILL — DOK PLUS 8.6 MG-50 MG TABLET: ORAL | Qty: 2

## 2017-11-06 MED FILL — VELTASSA 8.4 GRAM ORAL POWDER PACKET: 8.4 gram | ORAL | Qty: 1

## 2017-11-06 MED FILL — SILDENAFIL 20 MG TAB: 20 mg | ORAL | Qty: 0.5

## 2017-11-06 MED FILL — HEPARIN (PORCINE) 5,000 UNIT/ML IJ SOLN: 5000 unit/mL | INTRAMUSCULAR | Qty: 1

## 2017-11-06 MED FILL — MAPAP (ACETAMINOPHEN) 325 MG TABLET: 325 mg | ORAL | Qty: 2

## 2017-11-06 NOTE — Progress Notes (Signed)
Chart reviewed.  Attempted to see Pt, however Pt currently on BiPAP, as she continues to decompensate with minimal activity off of BiPAP.  Pt reports feeling poorly and requested to hold OT today.  Will defer OT session today and continue to follow.    SPX CorporationWhitney Cherry, OTR/L

## 2017-11-06 NOTE — Progress Notes (Signed)
GI NOTE    No new LFT's  I will check her again on Monday.    Dr. Elmon KirschnerFeder on call this weekend if help needed.

## 2017-11-06 NOTE — Other (Signed)
Interdisciplinary team rounds were held 11/06/2017 with the following team members:Care Management, Diabetes Treatment Specialist, Nursing, Nutrition, Pharmacy, Physician and Respiratory Therapy.    Plan of care discussed. See clinical pathway and/or care plan for interventions and desired outcomes.

## 2017-11-06 NOTE — Progress Notes (Addendum)
0715- Bedside and Verbal shift change report given to Tawanna Coolerhristen RN (oncoming nurse) by StatisticianCrystal RN (offgoing nurse). Report included the following information SBAR, Kardex, Intake/Output, Recent Results, Cardiac Rhythm NSR and Alarm Parameters .    [id number]- Shift assessment completed; see flow sheet for details. Pt sleeping but responds to voice; once awaken able to follow commands without difficulty; off BiPAP speech is clear; pt reports "not feeling well" but unable to specify exactly what she is feeling . Currently in NSR: BP stable. On BiPAP at this time; will transition to HiFlow as long as O2 sats tolerate; SOB noted both on exertion and at rest. ABD is soft; BS hypoactive; appetite poor. Has been voiding without difficulty; no UOP at the start of this shift. Skin is warm and dry; +3 pitting edema to BLE; trace amount to hands. Pt denies pain/discomfort at this time.     0845- Pt transitioned to HiFlow~100%; O2 sats 84-88%; BiPAP placed on and Dr. Yetta BarreJones made aware; requesting pt to stay on BiPAP today.    [id number]- Dr. Mora Applickman at the bedside; see progress note for details.     [id number]- Pt reporting that she wants to eat a "little" breakfast; placed pt back on HiFlow; O2 sats holding at 90%; pt consumed some of breakfast; noted with SOB on both exertion and at rest. Will monitor.     1145- Purewick placed; pt too weak to ambulate per her normal     1205- Reassessment completed; see flow sheet for details. No acute changes in pt assessment; appears to be resting comfortably.    1600- Reassessment completed; see flow sheet for details. No acute changes in pt's assessment; pt alternating between BiPAP and Hiflow for meals. O2 sats WNL at this time; will monitor. Has not voided this shift; pt bladder scanned per protocol~28436ml in bladder; will monitor    1835- Blader scan~27947ml; pt reports that she is starting to get the urge to void but is not successful at this time; will continue to monitor.  Purewick in place; PO intake encouraged and accepted. Pt denies bladder discomfort; no distention note at this time.     1930- Bedside and Verbal shift change report given to Dole Fooduzen RN (oncoming nurse) by Tawanna Coolerhristen RN (offgoing nurse). Report included the following information SBAR, Kardex, Intake/Output, Recent Results, Cardiac Rhythm NSR and Alarm Parameters .

## 2017-11-06 NOTE — Consults (Addendum)
[facility] Consult Note        NAME: Brittney LimesMary S Shelton       DOB:  19-Apr-1951       MRN:  [id number]     Date/Time: 11/06/2017    Risk of deterioration: high       Assessment:    Plan:  AKI with hyperkalemia  Acute/chronic resp failure  Pulm HTN/dil RA/RV  OBesity  DM  PNA  Thrombocytopenia  Transaminitis -? Hepatic congestion aki multifactorial-seems to be endstage from her pulm htn/distolic heart failure and is currently a dnr. Pt has had microalbuminuria for years which is representative of underlying diabetic nephropathy  For the hyperkalemia have added veltassa and changed diet  I would not recommend HD for her as it would not reverse her underlying pulm htn/heart failure  Pred contributing to her elevated bun.  Expect that her renal function will decline further.  CXR not indicative of pulm edema, earlier cta (-) for pe, edema. Difficult to judge volume status from exam alone  Very tenuous status.  Long term prognosis very poor.       Asked to see for aki/hyperkalemia. Pt on bipap and unable to give hpi/thorough history. D/W dr. Yetta BarreJones and chart reviewed. Creatinine was 1.94 earlier in week and now up to 2.52, BUN 80 on pred    Subjective:     Chief Complaint:  Unable to give    Review of Systems: Patient was not able to provide review of systems due to mental status change/acute illness    Objective:     VITALS:   Last 24hrs VS reviewed since prior progress note. Most recent are:  Visit Vitals  BP (!) 101/5   Pulse 77   Temp 98.1 ??F (36.7 ??C)   Resp 21   Ht 5\' 6"  (1.676 m)   Wt 135.1 kg (297 lb 13.5 oz)   SpO2 96%   BMI 48.07 kg/m??     SpO2 Readings from Last 6 Encounters:   11/06/17 96%   10/26/2017 (!) 55%   09/11/17 91%   07/30/17 90%   06/10/17 92%   06/04/17 93%    O2 Flow Rate (L/min): 50 l/min       Intake/Output Summary (Last 24 hours) at 11/06/2017 1011  Last data filed at 11/05/2017 2000  Gross per 24 hour   Intake 290 ml   Output ???   Net 290 ml        Telemetry Reviewed       PHYSICAL EXAM:     General   well developed, obese, AAF, on BIPAP, lethargic  EENT  Normocephalic, Atraumatic  Respiratory   Clear To Auscultation bilaterally  Cardiology  Regular Rate and Rythmn    Extremities  No clubbing, cyanosis, or edema. Pulses intact.              Lab Data Reviewed: (see below)    Medications Reviewed: (see below)    PMH/SH reviewed - no change compared to H&P  ___________________________________________________    ___________________________________________________    Attending Physician: Gary Fleetrudy L Kylon Philbrook, MD     ____________________________________________________  MEDICATIONS:  Current Facility-Administered Medications   Medication Dose Route Frequency   ??? patiromer calcium sorbitex (VELTASSA) powder 8.4 g  8.4 g Oral DAILY   ??? calcium acetate (PHOSLO) capsule 667 mg  1 Cap Oral TID WITH MEALS   ??? heparin (porcine) injection 5,000 Units  5,000 Units SubCUTAneous Q12H   ??? nystatin (MYCOSTATIN) 100,000 unit/gram powder   Topical  BID   ??? metoprolol succinate (TOPROL-XL) XL tablet 12.5 mg  12.5 mg Oral DAILY   ??? sildenafil (antihypertensive) (REVATIO) tablet 10 mg  10 mg Oral TID   ??? PHENYLephrine (NEO-SYNEPHRINE) 30 mg in 0.9% sodium chloride 250 mL infusion  10-100 mcg/min IntraVENous TITRATE   ??? albuterol-ipratropium (DUO-NEB) 2.5 MG-0.5 MG/3 ML  3 mL Nebulization QID RT   ??? predniSONE (DELTASONE) tablet 10 mg  10 mg Oral DAILY WITH BREAKFAST   ??? senna-docusate (PERICOLACE) 8.6-50 mg per tablet 2 Tab  2 Tab Oral BID   ??? artificial tears (dextran 70-hypromellose) (NATURAL BALANCE) 0.1-0.3 % ophthalmic solution 1 Drop  1 Drop Both Eyes PRN   ??? polyethylene glycol (MIRALAX) packet 17 g  17 g Oral DAILY   ??? dextrose 10% infusion 125-250 mL  125-250 mL IntraVENous PRN   ??? insulin lispro (HUMALOG) injection   SubCUTAneous AC&HS   ??? sodium chloride (NS) flush 5-40 mL  5-40 mL IntraVENous Q8H   ??? sodium chloride (NS) flush 5-40 mL  5-40 mL IntraVENous PRN    ??? acetaminophen (TYLENOL) tablet 650 mg  650 mg Oral Q6H PRN   ??? naloxone (NARCAN) injection 0.4 mg  0.4 mg IntraVENous PRN   ??? ondansetron (ZOFRAN) injection 4 mg  4 mg IntraVENous Q4H PRN   ??? bisacodyl (DULCOLAX) suppository 10 mg  10 mg Rectal DAILY PRN   ??? aspirin delayed-release tablet 81 mg  81 mg Oral DAILY   ??? glucose chewable tablet 16 g  4 Tab Oral PRN   ??? glucagon (GLUCAGEN) injection 1 mg  1 mg IntraMUSCular PRN        LABS:  Recent Labs     11/05/17  0936 11/04/17  1723   WBC 14.9* 11.1*   HGB 12.9 13.2   HCT 40.5 41.1   PLT 122* 111*     Recent Labs     11/05/17  0936 11/04/17  1723   NA 134* 137   K 5.0 4.8   CL 100 100   CO2 26 26   BUN 80* 73*   CREA 2.52* 2.14*   GLU 123* 179*   CA 8.4* 8.5   MG 2.5* 2.5*   PHOS 5.7* 4.9*     Recent Labs     11/05/17  0936 11/04/17  1723   SGOT 434* 151*   ALT 512* 288*   AP 88 93   TBILI 2.2* 2.0*   TP 6.8 7.2   ALB 3.4* 3.6   GLOB 3.4 3.6     No results for input(s): INR, PTP, APTT in the last 72 hours.    No lab exists for component: INREXT   No results for input(s): FE, TIBC, PSAT, FERR in the last 72 hours.   No results for input(s): PH, PCO2, PO2 in the last 72 hours.  No results for input(s): CPK, CKNDX, TROIQ in the last 72 hours.    No lab exists for component: CPKMB  Lab Results   Component Value Date/Time    Glucose (POC) 114 (H) 11/06/2017 09:21 AM    Glucose (POC) 129 (H) 11/05/2017 10:42 PM    Glucose (POC) 218 (H) 11/05/2017 04:39 PM    Glucose (POC) 128 (H) 11/05/2017 10:32 AM    Glucose (POC) 123 (H) 11/05/2017 07:48 AM

## 2017-11-06 NOTE — Progress Notes (Addendum)
[facility] Critical Care, and Sleep Medicine  Name: Brittney Shelton MRN: [id number]   DOB: 1950-07-16 Hospital: [facility]   Date: 11/06/2017        IMPRESSION:   ?? Acute/chronic hypoxic/hypercapnic respiratory failure; on 4 LPm at home for many yrs since she lived in [place];  Cxr does NOT  Show increased edema so fear this  Is her pulm htn. Has been on bipap since yesterday. Seems to be tolerating pretty well. On FIo2 of 70%. Comfortable at present.   ?? Community acquired pneumonia rll- completed antibiotic course   ?? "COPD exacerbation" - she has a diagnosis of COPD, but she has no obstruction on PFTs; patient of Dr. Jayme Cloud  ?? Acute renal failure- numbers increased, K is increased, Will ask Renal to assist.   ?? Severe Pulmonary HTN with dilated RV and RV failure on echo; CTA negative for PE; Seems to be chronic. Has been tolerating Revatio 10mg  po tid.   ?? LVH/diastolic heart failure- stable  Volume at present   ?? Obesity - ?OSA/OHS  ?? Constipation, nausea- still an ongoing problem-  ?? Elevated lft's.   .  US showed hepatomegly and dilated portal vein with slow flow  and biliary sludge and gb wall thickening. -? Related to hepatic congestion vs other . Was seen by Gi  np yesterday.  Abd is non- tender today .   ?? DROP  IN PLATELETS- was  lovenox 40 mg bid for prophylaxis.  Lovenox held and hit / serotonin studies sent 2 days ago are still pd - stable today at 121k  ?? Dm -Monitoring Glycemic Control  ?? 6/6 Got Midline placed.   ?? Discussed case with Dr. Fraser Din and Dr. Modesto Charon. Dr. Fraser Din  Has reviewed the chart and we discussed  The fact that she appears to have made no progress and actually is more bipap dependent now than she was earlier in the week.  Dr. Fraser Din thinks pt  May have end stage primary pulm htn.  There may be little we can do .  NOt able to have rt heart cath.  Dr. Fraser Din says it is  ok to try sildenafil but do  So at a smaller starting dose of 10 mg.  Will start tonight and if she drops her bp will stop it.   ?? Dr. Vevelyn Pat had following conversation.  , detailed conversation with pt and her sister  In law.  I told her what we think is going on and that we are likely not going to be able to reverse this.  We discussed risks and possible benefit of siledenafil and what will likely be the outcome no matter what we do.  She may not survive this.  She appeared to understand.  She was ok with trial of low dose sildenafil.  IN the presence of her sister in law we discussed code status and she will be a  Full no code at this point.  NO shock, cpr , vent or code drug.  cAn use neo if she drops her bp on sildenafil but would not continue the trial if she does  So.  Prognosis is guarded.  Spent  35 minutes in conversation with pt and and sister in  Law regarding situation and advanced care / code status option.  May consider hospice   If she gets no better.  Have held on consult for today to allow her some time to think and  For [place]  to see how she does.   ?? Remains Critically ill, over 35 min CC, spent with pt. High risk of decompensation. Using high risk meds. Being actively adjusted. Multiple organ failure.       PLAN:   ?? CCU  Monitoring - still at risk for decline and no wiggle room resp wise   ?? Keep prednisone at same dose for now.  ?? Will ask Renal to evaluate and assist in care.   ?? Will keep Sildenafil at same level for now.   ?? See if we can get to high flow again today   ?? She has not been stable enough  To go to v/q  Scanner from a  resp standpoint and  Therefore is not stable enough for rt heart cath at this time.   ?? Will ask PICC team to assess for PICC line versus, Midline. - we do not have access for lab draws   ?? Eventually needs diagnostic RHC, vasodilator trial - per report cards here would prefer this be done  By Dr. Fraser Din   ?? If she declines, will need intubation, Bipap for now.    ?? Incentive spirometer  ?? Bronchodilators - adjust to day time - done   ?? Now off ab   ?? Follow up cultures- done  And neg   ?? Diurese daily-  Is on hold - am renal labs pd   ?? cxr and labs  Daily   ?? HIT AND SEROTONIN PANEL - HOLD LOVENOX - DISCUSSED WITH PHARMACIST AND RN   ?? scd  ?? Will eventually need sleep study  After dc   ?? Insulin   ?? DVT prophyaxis  ?? Miralax , Zofran  ?? Condition remains guarded.  Not yet stable enough to undergo v/q or additional testing out of icu.   ?? Check liver labs daily   ?? Consider Gi eval if lft's increased  ?? Would consider palliative care evaluation.   ?? Ongoing evaluations and treatments.   ?? WILL UPDATE DR Fraser Din WHO HAS BEEN ASSISTING [place] WITH CARE. ? SILDENAFIL OR INHALED NITRIC IN VIEW OF HER TENUOUS RESP STATUS - WILL ADVISE FURTHER AFTER TALKING WITH HER .      Subjective/Interval History:   I have reviewed the flowsheet and previous day???s notes.    11-05-17: Pt is alert and conversant this am on bipap. NO chest pain, no back pain. NO leg pain.   Denies any nausea.     11-04-17: Pt feels tired, weak this am. Has fatigue. She was feeling nauseated and sick to her stomach. No chest pain, no back pain, no abdominal pain, no leg pain.  Has poor appetite this am.       5/30 HFNC 45 LPM. OOB the other day. Slept without BIPAP. ROS otherwise negative    5/29 BIPAP then HFNC this AM. sats hovering close to 88%- 90%. No congestion. No fver. I > O since admission. No preipheral edema. CXr reviewed- no effusion    5/28 No acute events overnight.On bipap.   No severe distress    6/3 - was sleeping without any resp distress when I came in. As can best tell was on high flow overnight.   6/4- says breathing is ok . Admits to rlq discomfort on direst palp - no rebound   6/6 - on bipap . No  resp complaints on bipap and no abd tenderness  But does have nausea.     Objective:  Vital Signs:  Visit Vitals  BP 106/69   Pulse 76   Temp 98.1 ??F (36.7 ??C)   Resp 21   Ht 5\' 6"  (1.676 m)    Wt 135.1 kg (297 lb 13.5 oz)   SpO2 95%   BMI 48.07 kg/m??       O2 Device: BIPAP   O2 Flow Rate (L/min): 50 l/min   Temp (24hrs), Avg:97.8 ??F (36.6 ??C), Min:97.5 ??F (36.4 ??C), Max:98.2 ??F (36.8 ??C)     Intake/Output:   Last shift:      No intake/output data recorded.  Last 3 shifts: 06/05 1901 - 06/07 0700  In: 650 [P.O.:650]  Out: -     Intake/Output Summary (Last 24 hours) at 11/06/2017 0718  Last data filed at 11/05/2017 2000  Gross per 24 hour   Intake 290 ml   Output ???   Net 290 ml     Physical exam  Gen: morbidly obese.  no distress  On bipap   heent - bipap  Neck -  nontender   cardiac - rrr , sinus   Lungs- minimal rales in bases posterior     abd - soft  And no tenderness   Ext-   Large with trace edema of bilateral lower extremities.    neuro - appears alert and  Oriented, seems grossly weak.    Psych: nonfocal. NO overt depression, no anxiety.     Data:     Current Facility-Administered Medications   Medication Dose Route Frequency   ??? nystatin (MYCOSTATIN) 100,000 unit/gram powder   Topical BID   ??? metoprolol succinate (TOPROL-XL) XL tablet 12.5 mg  12.5 mg Oral DAILY   ??? sildenafil (antihypertensive) (REVATIO) tablet 10 mg  10 mg Oral TID   ??? PHENYLephrine (NEO-SYNEPHRINE) 30 mg in 0.9% sodium chloride 250 mL infusion  10-100 mcg/min IntraVENous TITRATE   ??? albuterol-ipratropium (DUO-NEB) 2.5 MG-0.5 MG/3 ML  3 mL Nebulization QID RT   ??? predniSONE (DELTASONE) tablet 10 mg  10 mg Oral DAILY WITH BREAKFAST   ??? senna-docusate (PERICOLACE) 8.6-50 mg per tablet 2 Tab  2 Tab Oral BID   ??? polyethylene glycol (MIRALAX) packet 17 g  17 g Oral DAILY   ??? insulin lispro (HUMALOG) injection   SubCUTAneous AC&HS   ??? sodium chloride (NS) flush 5-40 mL  5-40 mL IntraVENous Q8H   ??? aspirin delayed-release tablet 81 mg  81 mg Oral DAILY                Labs:  Recent Labs     11/05/17  0936 11/04/17  1723   WBC 14.9* 11.1*   HGB 12.9 13.2   HCT 40.5 41.1   PLT 122* 111*     Recent Labs     11/05/17  0936 11/04/17   1723 11/03/17  0830   NA 134* 137  --    K 5.0 4.8  --    CL 100 100  --    CO2 26 26  --    GLU 123* 179*  --    BUN 80* 73*  --    CREA 2.52* 2.14*  --    CA 8.4* 8.5  --    MG 2.5* 2.5*  --    PHOS 5.7* 4.9*  --    ALB 3.4* 3.6  --    TBILI 2.2* 2.0* 2.2*   SGOT 434* 151* 200*   ALT 512* 288* 246*     No results for input(s): PHI, PCO2I,  PO2I, HCO3I, FIO2I in the last 72 hours.  Imaging:  I have personally reviewed the patient???s radiographs and have reviewed the reports:  cxr portable: 11-05-17: IMPRESSION:  ?? no overt decompensated chf - mild increased markings  On portable film.           Constance Goltz, MD

## 2017-11-06 NOTE — Progress Notes (Signed)
Nutrition Assessment:    INTERVENTIONS/RECOMMENDATIONS:   Meals/Snacks: General/healthful diet:Continue current diet   Supplements: Commercial supplement:Nepro TID       ASSESSMENT:   Chart reviewed, visited patient at bedside it was difficult to understand patient clearly, patient was on BiPAP at time of visit. Patient stated her dislike for Ensure clear, offered patient new supplement, patient agreed to try. Added Nepro to patient's diet order, encouraged patient to sip supplements if she is experiencing shortness of breath while eating.Per progress notes patient is a possible hospice candidate, CM following. Will follow up with patient and adjust supplements if necessary.     Diet Order: Consistent carb, Renal, Other (comment)(70/70/70)  % Eaten:    Patient Vitals for the past 72 hrs:   % Diet Eaten   11/05/17 1330 2 %   11/05/17 1034 5 %   11/04/17 1800 20 %   11/04/17 1300 10 %   11/03/17 1819 20 %     Pertinent Medications: '[x]'  Reviewed '[]' Other: Dulcolax, Humalog   Pertinent Labs: '[x]' Reviewed  '[]' Other: Phos- 5.7   Food Allergies: '[x]' None '[]' Other:   Last BM: 6/3   '[]' Active     '[]' Hyperactive  '[x]' Hypoactive       '[]'  Absent  BS  Skin:    '[]'  Intact   '[]'  Incision  '[]'  Breakdown   '[x]' Edema   '[]' Other:  Anthropometrics: Height: '5\' 6"'  (167.6 cm) Weight: 135.1 kg (297 lb 13.5 oz)    IBW (%IBW):   ( ) UBW (%UBW):   (  %)    BMI: Body mass index is 48.07 kg/m??.    This BMI is indicative of:  '[]' Underweight   '[]' Normal   '[]' Overweight   '[]'  Obesity   '[x]'  Extreme Obesity (BMI>40)  Last Weight Metrics:  Weight Loss Metrics 11/05/2017 10/17/2017 09/11/2017 07/30/2017 06/19/2017 05/29/2017 05/28/2017   Today's Wt 297 lb 13.5 oz - 262 lb 11.2 oz 261 lb 258 lb 259 lb 259 lb 8.4 oz   BMI - 48.07 kg/m2 42.4 kg/m2 42.13 kg/m2 41.64 kg/m2 41.8 kg/m2 41.89 kg/m2       Estimated Nutrition Needs (Based on): 2058 Kcals/day(MSJ 1871 x 1.1) , 88 g(1.5gPro/kg IBW ) Protein  Carbohydrate: At Least 130 g/day  Fluids: 2000 mL/day     NUTRITION DIAGNOSES:   Problem:  Inadequate protein-energy intake      Etiology: related to Poor appetite      Signs/Symptoms: as evidenced by PO intake <25 % of meals     Previous Nutrition Dx:  '[]'  Resolved  '[]'  Unresolved           '[x]'  Progressing    NUTRITION INTERVENTIONS:  Meals/Snacks: General/healthful diet   Supplements: Commercial supplement              GOAL:   PO intake > 70% of meals /snacks next 3-5 days     NUTRITION MONITORING AND EVALUATION    Food/Nutrient Intake Outcomes: Total energy intake  Physical Signs/Symptoms Outcomes: Electrolyte and renal profile, GI profile, Weight/weight change, Glucose profile, GI    Previous Goal Met:   '[]'  Met              '[x]'  Progressing Towards Goal              '[]'  Not Progressing Towards Goal   Previous Recommendations:   '[x]'  Implemented          '[]'  Not Implemented          '[]'  Not Applicable  LEARNING NEEDS (Diet, Food/Nutrient-Drug Interaction):    '[x]'  None Identified   '[]'  Identified and Education Provided/Documented   '[]'  Identified and Pt declined/was not appropriate     Cultural, Religious, OR Ethnic Dietary Needs:    '[x]'  None Identified   '[]'  Identified and Addressed     '[x]'  Interdisciplinary Care Plan Reviewed/Documented    '[x]'  Discharge Planning: Consistent carb, low sodium, low potassium, low protein diet + supplements as medically tolerated    '[]'  Participated in Interdisciplinary Rounds    NUTRITION RISK:    '[x]'  Patient At Nutritional Risk   '[]'  Patient Not At Nutritional Risk      Demetrio Lapping  Pager [phone]  Weekend Pager [phone]

## 2017-11-06 NOTE — Progress Notes (Signed)
Hospitalist Progress Note  NAME: Brittney Shelton   DOB:  [birth year]   MRN:  [id number]       Assessment / Plan:    Acute on chronic respiratory failure with hypoxia POA (baseline 4LNC)  Acute on chronic diastolic HF POA  Cor pulmonale POA due to severe pulmonary hypertension   Right lower lobe pneumonia  COPD exacerbation POA  -remains on high flow oxygen; desaturating easily with minimal exertion   -continues to need BIPAP prn   -completed Levaquin   -continue low dose prednisone  -scheduled duonebs  -diurese prn  -started on sildenafil. Prognosis guarded and she may not tolerated sildenafil.    ??  Acute kidney injury POA  -last normal Cr 09/2017,  -Renal input appreciated.  Not dialysis candidate.   ??  Essential HTN POA  -holding BP meds.  On vasopressor prn  ??  DM type 2 POA  Diet controlled at home  Last HgBa1c 6.4 in Dec 2018  Hyperglycemic on steroids; refusing insulin most of the time    SSI prn  ??  Right pleural effusion POA  Small, doubt thoracentesis would be helpful  ??  Nausea/vomiting  Constipation resolved   Elevated LFTs: r/o biliary colic   -KUB 6/2 normal   -Abd US Hepatomegaly.  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  Biliary sludge with mild gallbladder wall thickening.    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    Code status: Full  Prophylaxis: Hep SQ     Recommended Disposition: remain in ICU      Subjective:     Chief Complaint / Reason for Physician Visit:   follow up respiratory failure/ copd   Discussed with RN events overnight.       Review of Systems:  Symptom Y/N Comments  Symptom Y/N Comments   Fever/Chills n   Chest Pain n    Poor Appetite    Edema     Cough    Abdominal Pain n    Sputum    Joint Pain     SOB/DOE    Pruritis/Rash     Nausea/vomit n   Tolerating PT/OT     Diarrhea n   Tolerating Diet     Constipation n   Other        Could NOT obtain due to:      Objective:     VITALS:   Last 24hrs VS reviewed since prior progress note. Most recent are:  Patient Vitals for the past 24 hrs:   Temp Pulse Resp BP SpO2   11/06/17 1600 98.1 ??F (36.7 ??C) 74 20 100/64 96 %   11/06/17 1500 ??? 72 20 115/88 95 %   11/06/17 1400 ??? 76 21 112/61 96 %   11/06/17 1212 98.5 ??F (36.9 ??C) ??? ??? ??? ???   11/06/17 1200 98.5 ??F (36.9 ??C) 77 19 102/53 98 %   11/06/17 1105 ??? ??? ??? ??? 96 %   11/06/17 1100 ??? 78 20 99/55 94 %   11/06/17 1000 ??? 73 25 (!) 95/33 (!) 86 %   11/06/17 0905 ??? 77 ??? (!) 101/5 ???   11/06/17 0900 ??? 78 21 101/55 (!) 86 %   11/06/17 0800 97.7 ??F (36.5 ??C) 74 19 100/67  96 %   11/06/17 0738 ??? ??? ??? ??? 96 %   11/06/17 0700 ??? 75 22 95/66 96 %   11/06/17 0600 ??? 76 21 106/69 95 %   11/06/17 0500 ??? 73 24 94/66 93 %   11/06/17 0400 98.1 ??F (36.7 ??C) 75 19 102/51 97 %   11/06/17 0300 ??? 77 22 112/64 97 %   11/06/17 0200 ??? 75 21 94/62 96 %   11/06/17 0100 ??? 79 20 113/60 97 %   11/06/17 0000 98 ??F (36.7 ??C) 78 20 97/64 97 %   11/05/17 2300 ??? 80 18 108/65 98 %   11/05/17 2200 ??? 83 23 100/64 97 %   11/05/17 2000 98.2 ??F (36.8 ??C) 78 25 118/61 97 %   11/05/17 1900 ??? 81 23 110/60 98 %   11/05/17 1800 ??? 80 22 108/65 96 %       Intake/Output Summary (Last 24 hours) at 11/06/2017 1711  Last data filed at 11/05/2017 2000  Gross per 24 hour   Intake 0 ml   Output ???   Net 0 ml        PHYSICAL EXAM:  General: WD, WN. Alert, cooperative, no acute distress, morbidly obese  EENT:  EOMI. Anicteric sclerae. MMM  Resp:  Exam limited by habitus, no wheezing or rales  CV:  Regular  Rhythm,??+ edema  GI:  Soft, Non distended, Non tender. ??+Bowel sounds  Neurologic:?? Alert and oriented X 3, normal speech,   Psych:???? Fair insight.??Not anxious nor agitated    Reviewed most current lab test results and cultures  YES  Reviewed most current radiology test results   YES  Review and summation of old records today    NO  Reviewed patient's current orders and MAR    YES   PMH/SH reviewed - no change compared to H&P  ________________________________________________________________________  Care Plan discussed with:    Comments   Patient y    Family      RN y    Buyer, retail                        Multidiciplinary team rounds were held today with case manager, nursing, pharmacist and Higher education careers adviser.  Patient's plan of care was discussed; medications were reviewed and discharge planning was addressed.     ________________________________________________________________________  Total NON critical care TIME:  25  Minutes    Total CRITICAL CARE TIME Spent:   Minutes non procedure based      Comments   >50% of visit spent in counseling and coordination of care     ________________________________________________________________________  Pamalee Leyden, MD     Procedures: see electronic medical records for all procedures/Xrays and details which were not copied into this note but were reviewed prior to creation of Plan.      LABS:  I reviewed today's most current labs and imaging studies.  Pertinent labs include:  Recent Labs     11/05/17  0936 11/04/17  1723   WBC 14.9* 11.1*   HGB 12.9 13.2   HCT 40.5 41.1   PLT 122* 111*     Recent Labs     11/05/17  0936 11/04/17  1723   NA 134* 137   K 5.0 4.8   CL 100 100   CO2 26 26   GLU 123* 179*   BUN 80* 73*   CREA 2.52* 2.14*   CA  8.4* 8.5   MG 2.5* 2.5*   PHOS 5.7* 4.9*   ALB 3.4* 3.6   TBILI 2.2* 2.0*   SGOT 434* 151*   ALT 512* 288*       Signed: Pamalee Leyden, MD

## 2017-11-06 NOTE — Progress Notes (Signed)
TOC PLAN: TBD - Pt may consider Hospice care if trial of medication for end stage pulmonary hypertension is not effective.    Pt more BIPAP dependent - using high flow 02 for short periods but decompensating with minimal activity ie, eating.     Pulmonary/Intensivists are following very closely - had conversation with Dr. Claria DiceJohnri (Pulm HTN specialist) re: pt's case - recommended low dose trial of sildenafil. Feels pt may be end stage.  Dr. Vevelyn PatMiksa also had extended conversation with pt and her sister-in-law on 6/6 to explain above with recommendations and likely poor outcome no matter what is done. Pt decided on Full DNR - Will Need DDNR form at some point prior to DC.  Plan is to see how sildenafil works and give pt some time to think.    CM will follow for possible Hospice consult in coming days. Pt could consider Home Hospice with Medicaid Personal Care Aides or Hospice in facility since she has Medicare/Medicaid. CM will initiate [facility] if one not already present in system.    Vernice Jeffersononalyn K Hohman, MSW  [phone]

## 2017-11-06 NOTE — Progress Notes (Signed)
Physical Therapy  Chart reviewed.  Attempted to see pt, however pt currently on Bipap, as she continues to decompensate with minimal activity off of Bipap.  Spoke to pt and she reports feeling poorly and requested to hold PT today.  Will defer PT session today and continue to follow.

## 2017-11-07 ENCOUNTER — Inpatient Hospital Stay: Admit: 2017-11-07 | Payer: MEDICARE | Primary: Internal Medicine

## 2017-11-07 LAB — GLUCOSE, POC
Glucose (POC): 219 mg/dL — ABNORMAL HIGH (ref 65–100)
Glucose (POC): 117 mg/dL — ABNORMAL HIGH (ref 65–100)
Glucose (POC): 156 mg/dL — ABNORMAL HIGH (ref 65–100)
Glucose (POC): 190 mg/dL — ABNORMAL HIGH (ref 65–100)

## 2017-11-07 LAB — METABOLIC PANEL, COMPREHENSIVE
A-G Ratio: 1.1 (ref 1.1–2.2)
ALT (SGPT): 871 U/L — ABNORMAL HIGH (ref 12–78)
AST (SGOT): 448 U/L — ABNORMAL HIGH (ref 15–37)
Albumin: 3.4 g/dL — ABNORMAL LOW (ref 3.5–5.0)
Alk. phosphatase: 113 U/L (ref 45–117)
Anion gap: 10 mmol/L (ref 5–15)
BUN/Creatinine ratio: 30 — ABNORMAL HIGH (ref 12–20)
BUN: 95 MG/DL — ABNORMAL HIGH (ref 6–20)
Bilirubin, total: 1.9 MG/DL — ABNORMAL HIGH (ref 0.2–1.0)
CO2: 25 mmol/L (ref 21–32)
Calcium: 8.2 MG/DL — ABNORMAL LOW (ref 8.5–10.1)
Chloride: 98 mmol/L (ref 97–108)
Creatinine: 3.15 MG/DL — ABNORMAL HIGH (ref 0.55–1.02)
GFR est AA: 18 mL/min/{1.73_m2} — ABNORMAL LOW (ref >60)
GFR est non-AA: 15 mL/min/{1.73_m2} — ABNORMAL LOW (ref >60)
Globulin: 3.2 g/dL (ref 2.0–4.0)
Glucose: 121 mg/dL — ABNORMAL HIGH (ref 65–100)
Potassium: 5.6 mmol/L — ABNORMAL HIGH (ref 3.5–5.1)
Protein, total: 6.6 g/dL (ref 6.4–8.2)
Sodium: 133 mmol/L — ABNORMAL LOW (ref 136–145)

## 2017-11-07 LAB — MAGNESIUM: Magnesium: 2.8 mg/dL — ABNORMAL HIGH (ref 1.6–2.4)

## 2017-11-07 LAB — PHOSPHORUS: Phosphorus: 6.7 MG/DL — ABNORMAL HIGH (ref 2.6–4.7)

## 2017-11-07 MED ORDER — SODIUM POLYSTYRENE SULFONATE ORAL POWDER
ORAL | Status: DC
Start: 2017-11-07 — End: 2017-11-07

## 2017-11-07 MED ORDER — IPRATROPIUM-ALBUTEROL 2.5 MG-0.5 MG/3 ML NEB SOLUTION
2.5 mg-0.5 mg/3 ml | Freq: Four times a day (QID) | RESPIRATORY_TRACT | Status: DC
Start: 2017-11-07 — End: 2017-11-10
  Administered 2017-11-07 – 2017-11-10 (×13): via RESPIRATORY_TRACT

## 2017-11-07 MED ORDER — SODIUM POLYSTYRENE SULFONATE ORAL POWDER
ORAL | Status: AC
Start: 2017-11-07 — End: 2017-11-07

## 2017-11-07 MED ORDER — BUMETANIDE 0.25 MG/ML IJ SOLN
0.25 mg/mL | Freq: Once | INTRAMUSCULAR | Status: AC
Start: 2017-11-07 — End: 2017-11-07
  Administered 2017-11-07: 16:00:00 via INTRAVENOUS

## 2017-11-07 MED ORDER — BUMETANIDE 0.25 MG/ML IJ SOLN
0.25 mg/mL | Freq: Two times a day (BID) | INTRAMUSCULAR | Status: DC
Start: 2017-11-07 — End: 2017-11-10
  Administered 2017-11-08 – 2017-11-10 (×6): via INTRAVENOUS

## 2017-11-07 MED ORDER — SODIUM POLYSTYRENE SULFONATE 15 GRAM/60 ML ORAL SUSPENSION
15 gram/60 mL | ORAL | Status: DC
Start: 2017-11-07 — End: 2017-11-07
  Administered 2017-11-07: 15:00:00 via ORAL

## 2017-11-07 MED FILL — CALCIUM ACETATE 667 MG CAP: 667 mg | ORAL | Qty: 1

## 2017-11-07 MED FILL — BUMETANIDE 0.25 MG/ML IJ SOLN: 0.25 mg/mL | INTRAMUSCULAR | Qty: 10

## 2017-11-07 MED FILL — HEALTHYLAX 17 GRAM ORAL POWDER PACKET: 17 gram | ORAL | Qty: 1

## 2017-11-07 MED FILL — PREDNISONE 5 MG TAB: 5 mg | ORAL | Qty: 2

## 2017-11-07 MED FILL — REVATIO 20 MG TABLET: 20 mg | ORAL | Qty: 1

## 2017-11-07 MED FILL — IPRATROPIUM-ALBUTEROL 2.5 MG-0.5 MG/3 ML NEB SOLUTION: 2.5 mg-0.5 mg/3 ml | RESPIRATORY_TRACT | Qty: 3

## 2017-11-07 MED FILL — ASPIRIN 81 MG TAB, DELAYED RELEASE: 81 mg | ORAL | Qty: 1

## 2017-11-07 MED FILL — HEPARIN (PORCINE) 5,000 UNIT/ML IJ SOLN: 5000 unit/mL | INTRAMUSCULAR | Qty: 1

## 2017-11-07 MED FILL — INSULIN LISPRO 100 UNIT/ML INJECTION: 100 unit/mL | SUBCUTANEOUS | Qty: 1

## 2017-11-07 MED FILL — BD POSIFLUSH NORMAL SALINE 0.9 % INJECTION SYRINGE: INTRAMUSCULAR | Qty: 10

## 2017-11-07 MED FILL — VELTASSA 8.4 GRAM ORAL POWDER PACKET: 8.4 gram | ORAL | Qty: 1

## 2017-11-07 MED FILL — ONDANSETRON (PF) 4 MG/2 ML INJECTION: 4 mg/2 mL | INTRAMUSCULAR | Qty: 2

## 2017-11-07 MED FILL — SPS (WITH SORBITOL) 15 GRAM-20 GRAM/60 ML ORAL SUSPENSION: 15-20 gram/60 mL | ORAL | Qty: 240

## 2017-11-07 MED FILL — DOK PLUS 8.6 MG-50 MG TABLET: ORAL | Qty: 2

## 2017-11-07 MED FILL — MAPAP (ACETAMINOPHEN) 325 MG TABLET: 325 mg | ORAL | Qty: 2

## 2017-11-07 MED FILL — SODIUM POLYSTYRENE SULFONATE ORAL POWDER: ORAL | Qty: 60

## 2017-11-07 NOTE — Progress Notes (Signed)
[facility]  Name: Brittney LimesMary S Shelton MRN: [id number]   DOB: 04/12/1951 [facility]   Date: 11/07/2017        IMPRESSION:   ?? Acute/chronic hypoxic/hypercapnic respiratory failure; on 4 LPm at home for many yrs since [place] lived in [place];  Cxr does NOT  Show increased edema so fear this  Is her pulm htn. Has been on bipap since yesterday. Seems to be tolerating pretty well. On FIo2 of 70%. Comfortable at present.   ?? Community acquired pneumonia rll- completed antibiotic course   ?? "COPD exacerbation" - she has a diagnosis of COPD, but she has no obstruction on PFTs; patient of Dr. Jayme CloudGonzalez  ?? Acute renal failure- numbers increased, K is increased, Will ask Renal to assist.   ?? Severe Pulmonary HTN with dilated RV and RV failure on echo; CTA negative for PE; Seems to be chronic. Has been tolerating Revatio 10mg  po tid. likley adanced, end stage PPHTN  ?? LVH/diastolic heart failure- stable  Volume at present   ?? Obesity - ?OSA/OHS  ?? Constipation, nausea- still an ongoing problem-  ?? Elevated lft's.   .  [place] showed hepatomegly and dilated portal vein with slow flow  and biliary sludge and gb wall thickening. -? Related to hepatic congestion vs other . Was seen by Gi  np yesterday.  Abd is non- tender today .   ?? DROP  IN PLATELETS- was  lovenox 40 mg bid for prophylaxis.  Lovenox held and hit / serotonin studies sent 2 days ago are still pd - stable today at 121k  ?? Dm -Monitoring Glycemic Control  ?? 6/6 Got Midline placed.   ?? Prognosis is guarded.  Spent  35 minutes in conversation with pt and and sister in  Law regarding situation and advanced care / code status option.  May consider hospice   If she gets no better.  Have held on consult for today to allow her some time to think and  For us to see how she does.   ?? Remains Critically ill, over 35 min CC, spent with pt. High risk of  decompensation. Using high risk meds. Being actively adjusted. Multiple organ failure.       PLAN:   ?? CCU  Monitoring - still at risk for decline and no wiggle room resp wise   ?? Keep prednisone at same dose for now.  ?? Will ask Renal to evaluate and assist in care.   ?? Will keep Sildenafil at same level for now.   ?? See if we can get to high flow again today   ?? She has not been stable enough  To go to v/q  Scanner from a  resp standpoint and  Therefore is not stable enough for rt heart cath at this time.   ?? Will ask PICC team to assess for PICC line versus, Midline. - we do not have access for lab draws   ?? Eventually needs diagnostic RHC, vasodilator trial - per report cards here would prefer this be done  By Dr. Fraser DinJOhri   ?? If she declines, will need intubation, Bipap for now.   ?? Incentive spirometer  ?? Bronchodilators - adjust to day time - done   ?? Now off ab   ?? Follow up cultures- done  And neg   ?? Diurese daily-  Is on hold - am renal labs pd   ?? cxr and labs  Daily   ?? HIT AND SEROTONIN PANEL - HOLD LOVENOX - DISCUSSED WITH PHARMACIST AND RN   ?? scd  ?? Will eventually need sleep study  After dc   ?? Insulin   ?? DVT prophyaxis  ?? Miralax , Zofran  ?? Condition remains guarded.  Not yet stable enough to undergo v/q or additional testing out of icu.   ?? Check liver labs daily   ?? Consider Gi eval if lft's increased  ?? Would consider palliative care evaluation.   ?? Ongoing evaluations and treatments.   ?? WILL UPDATE DR Fraser Din WHO HAS BEEN ASSISTING [place] WITH CARE. ? SILDENAFIL OR INHALED NITRIC IN VIEW OF HER TENUOUS RESP STATUS - WILL ADVISE FURTHER AFTER TALKING WITH HER .      Subjective/Interval History:   I have reviewed the flowsheet and previous day???s notes.    6/8 BIPAP all the time 14/10 and 80%- unable to wean says she feels better.    6/7 low dose sildenafil tolerated. Discussed case with Dr. Fraser Din and Dr. Modesto Charon. Dr. Fraser Din  Has reviewed the chart and we discussed  The fact that  she appears to have made no progress and actually is more bipap dependent now than she was earlier in the week.  Dr. Fraser Din thinks pt  May have end stage primary pulm htn.  There may be little we can do .  NOt able to have rt heart cath.  Dr. Fraser Din says it is ok to try sildenafil but do  So at a smaller starting dose of 10 mg.  Will start tonight and if she drops her bp will stop it.     Dr. Vevelyn Pat had following conversation.  , detailed conversation with pt and her sister  In law.  I told her what we think is going on and that we are likely not going to be able to reverse this.  We discussed risks and possible benefit of siledenafil and what will likely be the outcome no matter what we do.  She may not survive this.  She appeared to understand.  She was ok with trial of low dose sildenafil.  IN the presence of her sister in law we discussed code status and she will be a  Full no code at this point.  NO shock, cpr , vent or code drug.  cAn use neo if she drops her bp on sildenafil but would not continue the trial if she does  So      11-05-17: Pt is alert and conversant this am on bipap. NO chest pain, no back pain. NO leg pain.   Denies any nausea.     11-04-17: Pt feels tired, weak this am. Has fatigue. She was feeling nauseated and sick to her stomach. No chest pain, no back pain, no abdominal pain, no leg pain.  Has poor appetite this am.       5/30 HFNC 45 LPM. OOB the other day. Slept without BIPAP. ROS otherwise negative    5/29 BIPAP then HFNC this AM. sats hovering close to 88%- 90%. No congestion. No fver. I > O since admission. No preipheral edema. CXr reviewed- no effusion    5/28 No acute events overnight.On bipap.   No severe distress    6/3 - was sleeping without any resp distress when I came in. As can best tell was on high flow overnight.   6/4- says breathing is ok . Admits to rlq discomfort on direst palp - no rebound  6/6 - on bipap . No  resp complaints on bipap and no abd tenderness  But  does have nausea.     Objective:  Vital Signs:    Visit Vitals  BP (!) 76/45   Pulse 76   Temp 98.6 ??F (37 ??C)   Resp 20   Ht 5\' 6"  (1.676 m)   Wt 135.8 kg (299 lb 6.2 oz)   SpO2 96%   BMI 48.32 kg/m??       O2 Device: BIPAP   O2 Flow Rate (L/min): 50 l/min   Temp (24hrs), Avg:98.4 ??F (36.9 ??C), Min:98.1 ??F (36.7 ??C), Max:98.6 ??F (37 ??C)     Intake/Output:   Last shift:      No intake/output data recorded.  Last 3 shifts: 06/06 1901 - 06/08 0700  In: 0   Out: 200 [Urine:200]    Intake/Output Summary (Last 24 hours) at 11/07/2017 0824  Last data filed at 11/07/2017 0400  Gross per 24 hour   Intake ???   Output 200 ml   Net -200 ml     Physical exam  Gen: morbidly obese.  no distress  On bipap   heent - bipap  Neck -  nontender   cardiac - rrr , sinus   Lungs- minimal rales in bases posterior     abd - soft  And no tenderness   Ext-   Large with trace edema of bilateral lower extremities.    neuro - appears alert and  Oriented, seems grossly weak.    Psych: nonfocal. NO overt depression, no anxiety.     Data:     Current Facility-Administered Medications   Medication Dose Route Frequency   ??? albuterol-ipratropium (DUO-NEB) 2.5 MG-0.5 MG/3 ML  3 mL Nebulization Q6H RT   ??? patiromer calcium sorbitex (VELTASSA) powder 8.4 g  8.4 g Oral DAILY   ??? calcium acetate (PHOSLO) capsule 667 mg  1 Cap Oral TID WITH MEALS   ??? heparin (porcine) injection 5,000 Units  5,000 Units SubCUTAneous Q12H   ??? nystatin (MYCOSTATIN) 100,000 unit/gram powder   Topical BID   ??? metoprolol succinate (TOPROL-XL) XL tablet 12.5 mg  12.5 mg Oral DAILY   ??? sildenafil (antihypertensive) (REVATIO) tablet 10 mg  10 mg Oral TID   ??? PHENYLephrine (NEO-SYNEPHRINE) 30 mg in 0.9% sodium chloride 250 mL infusion  10-100 mcg/min IntraVENous TITRATE   ??? predniSONE (DELTASONE) tablet 10 mg  10 mg Oral DAILY WITH BREAKFAST   ??? senna-docusate (PERICOLACE) 8.6-50 mg per tablet 2 Tab  2 Tab Oral BID   ??? polyethylene glycol (MIRALAX) packet 17 g  17 g Oral DAILY    ??? insulin lispro (HUMALOG) injection   SubCUTAneous AC&HS   ??? sodium chloride (NS) flush 5-40 mL  5-40 mL IntraVENous Q8H   ??? aspirin delayed-release tablet 81 mg  81 mg Oral DAILY                Labs:  Recent Labs     11/05/17  0936 11/04/17  1723   WBC 14.9* 11.1*   HGB 12.9 13.2   HCT 40.5 41.1   PLT 122* 111*     Recent Labs     11/07/17  0436 11/05/17  0936 11/04/17  1723   NA 133* 134* 137   K 5.6* 5.0 4.8   CL 98 100 100   CO2 25 26 26    GLU 121* 123* 179*   BUN 95* 80* 73*   CREA 3.15*  2.52* 2.14*   CA 8.2* 8.4* 8.5   MG 2.8* 2.5* 2.5*   PHOS 6.7* 5.7* 4.9*   ALB 3.4* 3.4* 3.6   TBILI 1.9* 2.2* 2.0*   SGOT 448* 434* 151*   ALT 871* 512* 288*     No results for input(s): PHI, PCO2I, PO2I, HCO3I, FIO2I in the last 72 hours.  Imaging:  I have personally reviewed the patient???s radiographs and have reviewed the reports:  cxr portable: 11-05-17: IMPRESSION:  ?? no overt decompensated chf - mild increased markings  On portable film.           Thayer Headings, MD

## 2017-11-07 NOTE — Progress Notes (Signed)
Possible inaccurate BP reading due to resting and adipose tissues all over her. All other VS are stable except for the sats dropping sometimes on BIPAP. Patient remains alert and oriented noted.

## 2017-11-07 NOTE — Progress Notes (Signed)
[facility] Progress Note        NAME: Brittney Shelton       DOB:  Oct 14, 1950       MRN:  [id number]     Date/Time: 11/07/2017    Risk of deterioration: high       Assessment:    Plan:  AKI with hyperkalemia  Acute/chronic resp failure  Pulm HTN/dil RA/RV  OBesity  DM  PNA  Thrombocytopenia  Transaminitis -? Hepatic congestion aki multifactorial-seems to be endstage from her pulm htn/distolic heart failure and is currently a dnr. Pt has had microalbuminuria for years which is representative of underlying diabetic nephropathy  For the hyperkalemia have added veltassac changed diet and giving kayexolate/bumex today  I would not recommend HD for her as it would not reverse her underlying pulm htn/heart failure  Pred contributing to her elevated bun.  Expect that her renal function will decline further.  CXR not indicative of pulm edema, earlier cta (-) for pe, edema. Difficult to judge volume status from exam alone  Very tenuous status.  Long term prognosis very poor.           Subjective:     Chief Complaint:  Unable to give    Review of Systems: Patient was not able to provide review of systems due to mental status change/acute illness    Objective:     VITALS:   Last 24hrs VS reviewed since prior progress note. Most recent are:  Visit Vitals  BP (!) 146/109   Pulse 78   Temp 98 ??F (36.7 ??C)   Resp 21   Ht 5\' 6"  (1.676 m)   Wt 135.8 kg (299 lb 6.2 oz)   SpO2 100%   BMI 48.32 kg/m??     SpO2 Readings from Last 6 Encounters:   11/07/17 100%   10/28/2017 (!) 55%   09/11/17 91%   07/30/17 90%   06/10/17 92%   06/04/17 93%    O2 Flow Rate (L/min): 55 l/min       Intake/Output Summary (Last 24 hours) at 11/07/2017 1500  Last data filed at 11/07/2017 0900  Gross per 24 hour   Intake 60 ml   Output 200 ml   Net -140 ml        Telemetry Reviewed       PHYSICAL EXAM:    General   well developed, obese, AAF, on BIPAP, lethargic  EENT  Normocephalic, Atraumatic  Respiratory   Clear To Auscultation bilaterally  Cardiology  Regular Rate and Rythmn     Extremities  No clubbing, cyanosis, or edema. Pulses intact.              Lab Data Reviewed: (see below)    Medications Reviewed: (see below)    PMH/SH reviewed - no change compared to H&P  ___________________________________________________    ___________________________________________________    Attending Physician: Gary Fleetrudy L Zandon Talton, MD     ____________________________________________________  MEDICATIONS:  Current Facility-Administered Medications   Medication Dose Route Frequency   ??? albuterol-ipratropium (DUO-NEB) 2.5 MG-0.5 MG/3 ML  3 mL Nebulization Q6H RT   ??? bumetanide (BUMEX) injection 2 mg  2 mg IntraVENous Q12H   ??? patiromer calcium sorbitex (VELTASSA) powder 8.4 g  8.4 g Oral DAILY   ??? calcium acetate (PHOSLO) capsule 667 mg  1 Cap Oral TID WITH MEALS   ??? heparin (porcine) injection 5,000 Units  5,000 Units SubCUTAneous Q12H   ??? nystatin (MYCOSTATIN) 100,000 unit/gram powder   Topical BID   ???  metoprolol succinate (TOPROL-XL) XL tablet 12.5 mg  12.5 mg Oral DAILY   ??? sildenafil (antihypertensive) (REVATIO) tablet 10 mg  10 mg Oral TID   ??? PHENYLephrine (NEO-SYNEPHRINE) 30 mg in 0.9% sodium chloride 250 mL infusion  10-100 mcg/min IntraVENous TITRATE   ??? predniSONE (DELTASONE) tablet 10 mg  10 mg Oral DAILY WITH BREAKFAST   ??? senna-docusate (PERICOLACE) 8.6-50 mg per tablet 2 Tab  2 Tab Oral BID   ??? artificial tears (dextran 70-hypromellose) (NATURAL BALANCE) 0.1-0.3 % ophthalmic solution 1 Drop  1 Drop Both Eyes PRN   ??? polyethylene glycol (MIRALAX) packet 17 g  17 g Oral DAILY   ??? dextrose 10% infusion 125-250 mL  125-250 mL IntraVENous PRN   ??? insulin lispro (HUMALOG) injection   SubCUTAneous AC&HS   ??? sodium chloride (NS) flush 5-40 mL  5-40 mL IntraVENous Q8H   ??? sodium chloride (NS) flush 5-40 mL  5-40 mL IntraVENous PRN   ??? acetaminophen (TYLENOL) tablet 650 mg  650 mg Oral Q6H PRN   ??? naloxone (NARCAN) injection 0.4 mg  0.4 mg IntraVENous PRN    ??? ondansetron (ZOFRAN) injection 4 mg  4 mg IntraVENous Q4H PRN   ??? bisacodyl (DULCOLAX) suppository 10 mg  10 mg Rectal DAILY PRN   ??? aspirin delayed-release tablet 81 mg  81 mg Oral DAILY   ??? glucose chewable tablet 16 g  4 Tab Oral PRN   ??? glucagon (GLUCAGEN) injection 1 mg  1 mg IntraMUSCular PRN        LABS:  Recent Labs     11/05/17  0936 11/04/17  1723   WBC 14.9* 11.1*   HGB 12.9 13.2   HCT 40.5 41.1   PLT 122* 111*     Recent Labs     11/07/17  0436 11/05/17  0936 11/04/17  1723   NA 133* 134* 137   K 5.6* 5.0 4.8   CL 98 100 100   CO2 25 26 26    BUN 95* 80* 73*   CREA 3.15* 2.52* 2.14*   GLU 121* 123* 179*   CA 8.2* 8.4* 8.5   MG 2.8* 2.5* 2.5*   PHOS 6.7* 5.7* 4.9*     Recent Labs     11/07/17  0436 11/05/17  0936 11/04/17  1723   SGOT 448* 434* 151*   ALT 871* 512* 288*   AP 113 88 93   TBILI 1.9* 2.2* 2.0*   TP 6.6 6.8 7.2   ALB 3.4* 3.4* 3.6   GLOB 3.2 3.4 3.6     No results for input(s): INR, PTP, APTT in the last 72 hours.    No lab exists for component: INREXT, INREXT   No results for input(s): FE, TIBC, PSAT, FERR in the last 72 hours.   No results for input(s): PH, PCO2, PO2 in the last 72 hours.  No results for input(s): CPK, CKNDX, TROIQ in the last 72 hours.    No lab exists for component: CPKMB  Lab Results   Component Value Date/Time    Glucose (POC) 156 (H) 11/07/2017 11:42 AM    Glucose (POC) 117 (H) 11/07/2017 07:30 AM    Glucose (POC) 219 (H) 11/06/2017 09:11 PM    Glucose (POC) 162 (H) 11/06/2017 04:27 PM    Glucose (POC) 158 (H) 11/06/2017 12:12 PM

## 2017-11-07 NOTE — Progress Notes (Addendum)
0700 Bedside and Verbal shift change report received from PepsiCouzen, Museum/gallery conservatorN (offgoing nurse). Report included the following information SBAR, Kardex, Intake/Output, MAR, Accordion and Recent Results.     0800 Assessment complete. See flowsheet for details.    1130 Patient requesting family-provided food for lunch. RN educating patient on need for limited salt and potassium intake per diet. Patient also educated on diabetic limits at this time. Patient unwilling to accept education.    1200 Reassessment unchanged. Will continue to monitor.    1445 Patient unable to drink kayexelate. Dr. Jodie Echevariaan made aware.    1600 Reassessment unchanged. Will continue to monitor.    1900 Bedside and Verbal shift change report given to PepsiCouzen, Charity fundraiserN (Cabin crewoncoming nurse). Report included the following information SBAR, Kardex, Intake/Output, MAR, Accordion and Recent Results.

## 2017-11-07 NOTE — Progress Notes (Signed)
Hospitalist Progress Note  NAME: AIMY SWEETING   DOB:  Dec 30, 1950   MRN:  [id number]       Assessment / Plan:    Acute on chronic respiratory failure with hypoxia POA (baseline 4LNC)  Acute on chronic diastolic HF POA  Cor pulmonale POA due to severe pulmonary hypertension   Right lower lobe pneumonia  COPD exacerbation POA  -remains needing either BIPAP or high flow oxygen; desaturating easily with minimal exertion.   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  Started low dose sildenafil   -completed Levaquin   -continue low dose prednisone  -scheduled duonebs  -diurese prn  ??  Acute kidney injury POA  Hyperkalemia  -Cr trending up.  Not dialysis candidate.   ??  Essential HTN POA  -holding BP meds.  On vasopressor prn  ??  DM type 2 POA  Diet controlled at home; Last HgBa1c 6.4 in Dec 2018  SSI prn  ??  ??  Nausea/vomiting  Constipation resolved   Elevated LFTs: r/o biliary colic   -KUB 6/2 normal   -Abd [place] Hepatomegaly.  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  Biliary sludge with mild gallbladder wall thickening.    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    Code status: Full  Prophylaxis: Hep SQ     Recommended Disposition: remain in ICU      Subjective:     Chief Complaint / Reason for Physician Visit:   follow up respiratory failure/ copd   Discussed with RN events overnight.       Review of Systems:  Symptom Y/N Comments  Symptom Y/N Comments   Fever/Chills n   Chest Pain n    Poor Appetite    Edema     Cough    Abdominal Pain n    Sputum    Joint Pain     SOB/DOE    Pruritis/Rash     Nausea/vomit n   Tolerating PT/OT     Diarrhea n   Tolerating Diet     Constipation n   Other       Could NOT obtain due to:      Objective:     VITALS:   Last 24hrs VS reviewed since prior progress note. Most recent are:  Patient Vitals for the past 24 hrs:    Temp Pulse Resp BP SpO2   11/07/17 1400 ??? 78 21 (!) 146/109 100 %   11/07/17 1338 ??? ??? ??? ??? 98 %   11/07/17 1300 ??? 80 24 107/64 (!) 87 %   11/07/17 1200 ??? 77 20 95/53 ???   11/07/17 1148 ??? ??? ??? ??? 96 %   11/07/17 1100 ??? 78 21 104/71 91 %   11/07/17 1000 ??? 80 21 94/64 (!) 83 %   11/07/17 0900 ??? ??? ??? (!) 89/41 ???   11/07/17 0800 98 ??F (36.7 ??C) 75 19 ??? 96 %   11/07/17 0743 ??? ??? ??? ??? 96 %   11/07/17 0700 ??? 76 20 ??? 94 %   11/07/17 0600 ??? 76 19 (!) 76/45 (!) 89 %   11/07/17 0500 ??? 75 22 (!) 86/58 (!) 83 %   11/07/17 0400 98.6 ??F (37 ??C) 76 23 (!) 87/55 (!) 83 %  11/07/17 0300 ??? 75 21 (!) 80/57 (!) 86 %   11/07/17 0200 ??? 74 23 (!) 85/51 (!) 84 %   11/07/17 0100 ??? 77 23 (!) 89/55 93 %   11/07/17 0000 98.4 ??F (36.9 ??C) 75 21 109/57 95 %   11/06/17 2300 ??? 76 19 96/63 97 %   11/06/17 2200 ??? 77 19 93/48 (!) 85 %   11/06/17 2100 ??? 81 21 97/57 97 %   11/06/17 2000 98.4 ??F (36.9 ??C) 78 22 93/56 92 %   11/06/17 1900 ??? 80 24 93/55 91 %   11/06/17 1802 ??? 79 22 108/62 ???   11/06/17 1800 ??? ??? ??? ??? 92 %   11/06/17 1711 ??? ??? ??? ??? 93 %   11/06/17 1700 ??? 75 19 106/74 97 %   11/06/17 1600 98.1 ??F (36.7 ??C) 74 20 100/64 96 %       Intake/Output Summary (Last 24 hours) at 11/07/2017 1501  Last data filed at 11/07/2017 0900  Gross per 24 hour   Intake 60 ml   Output 200 ml   Net -140 ml        PHYSICAL EXAM:  General: WD, WN. Alert, cooperative, no acute distress, morbidly obese  EENT:  EOMI. Anicteric sclerae. MMM  Resp:  Exam limited by habitus, no wheezing or rales  CV:  Regular  Rhythm,??+ edema  GI:  Soft, Non distended, Non tender. ??+Bowel sounds  Neurologic:?? Alert and oriented X 3, normal speech,   Psych:???? Fair insight.??Not anxious nor agitated    Reviewed most current lab test results and cultures  YES  Reviewed most current radiology test results   YES  Review and summation of old records today    NO  Reviewed patient's current orders and MAR    YES  PMH/SH reviewed - no change compared to H&P   ________________________________________________________________________  Care Plan discussed with:    Comments   Patient y    Family      RN y    Buyer, retailCare Manager     Consultant                        Multidiciplinary team rounds were held today with case manager, nursing, pharmacist and Higher education careers adviserclinical coordinator.  Patient's plan of care was discussed; medications were reviewed and discharge planning was addressed.     ________________________________________________________________________  Total NON critical care TIME:  25  Minutes    Total CRITICAL CARE TIME Spent:   Minutes non procedure based      Comments   >50% of visit spent in counseling and coordination of care     ________________________________________________________________________  Pamalee LeydenJoseph L Daaiyah Baumert, MD     Procedures: see electronic medical records for all procedures/Xrays and details which were not copied into this note but were reviewed prior to creation of Plan.      LABS:  I reviewed today's most current labs and imaging studies.  Pertinent labs include:  Recent Labs     11/05/17  0936 11/04/17  1723   WBC 14.9* 11.1*   HGB 12.9 13.2   HCT 40.5 41.1   PLT 122* 111*     Recent Labs     11/07/17  0436 11/05/17  0936 11/04/17  1723   NA 133* 134* 137   K 5.6* 5.0 4.8   CL 98 100 100   CO2 25 26 26    GLU 121* 123* 179*   BUN  95* 80* 73*   CREA 3.15* 2.52* 2.14*   CA 8.2* 8.4* 8.5   MG 2.8* 2.5* 2.5*   PHOS 6.7* 5.7* 4.9*   ALB 3.4* 3.4* 3.6   TBILI 1.9* 2.2* 2.0*   SGOT 448* 434* 151*   ALT 871* 512* 288*       Signed: Pamalee Leyden, MD

## 2017-11-08 LAB — GLUCOSE, POC
Glucose (POC): 214 mg/dL — ABNORMAL HIGH (ref 65–100)
Glucose (POC): 134 mg/dL — ABNORMAL HIGH (ref 65–100)
Glucose (POC): 124 mg/dL — ABNORMAL HIGH (ref 65–100)

## 2017-11-08 MED ORDER — SPIRONOLACTONE 50 MG TAB
50 mg | ORAL_TABLET | ORAL | 0 refills | Status: AC
Start: 2017-11-08 — End: ?

## 2017-11-08 MED ORDER — MORPHINE 4 MG/ML SYRINGE
4 mg/mL | INTRAMUSCULAR | Status: DC
Start: 2017-11-08 — End: 2017-11-10
  Administered 2017-11-08 – 2017-11-10 (×12): via RESPIRATORY_TRACT

## 2017-11-08 MED ORDER — SODIUM CHLORIDE 0.9 % INJECTION
5 mg/mL | Freq: Four times a day (QID) | INTRAMUSCULAR | Status: DC | PRN
Start: 2017-11-08 — End: 2017-11-10

## 2017-11-08 MED ORDER — MORPHINE 4 MG/ML SYRINGE
4 mg/mL | INTRAMUSCULAR | Status: DC | PRN
Start: 2017-11-08 — End: 2017-11-10
  Administered 2017-11-08 – 2017-11-10 (×2): via INTRAVENOUS

## 2017-11-08 MED ORDER — MORPHINE 4 MG/ML SYRINGE
4 mg/mL | INTRAMUSCULAR | Status: DC
Start: 2017-11-08 — End: 2017-11-08
  Administered 2017-11-08: 12:00:00 via RESPIRATORY_TRACT

## 2017-11-08 MED FILL — BD POSIFLUSH NORMAL SALINE 0.9 % INJECTION SYRINGE: INTRAMUSCULAR | Qty: 10

## 2017-11-08 MED FILL — BUMETANIDE 0.25 MG/ML IJ SOLN: 0.25 mg/mL | INTRAMUSCULAR | Qty: 10

## 2017-11-08 MED FILL — DOK PLUS 8.6 MG-50 MG TABLET: ORAL | Qty: 2

## 2017-11-08 MED FILL — MORPHINE 4 MG/ML SYRINGE: 4 mg/mL | INTRAMUSCULAR | Qty: 1

## 2017-11-08 MED FILL — IPRATROPIUM-ALBUTEROL 2.5 MG-0.5 MG/3 ML NEB SOLUTION: 2.5 mg-0.5 mg/3 ml | RESPIRATORY_TRACT | Qty: 3

## 2017-11-08 MED FILL — ASPIRIN 81 MG TAB, DELAYED RELEASE: 81 mg | ORAL | Qty: 1

## 2017-11-08 MED FILL — REVATIO 20 MG TABLET: 20 mg | ORAL | Qty: 1

## 2017-11-08 MED FILL — HEPARIN (PORCINE) 5,000 UNIT/ML IJ SOLN: 5000 unit/mL | INTRAMUSCULAR | Qty: 1

## 2017-11-08 MED FILL — HEALTHYLAX 17 GRAM ORAL POWDER PACKET: 17 gram | ORAL | Qty: 1

## 2017-11-08 MED FILL — VELTASSA 8.4 GRAM ORAL POWDER PACKET: 8.4 gram | ORAL | Qty: 1

## 2017-11-08 MED FILL — ONDANSETRON (PF) 4 MG/2 ML INJECTION: 4 mg/2 mL | INTRAMUSCULAR | Qty: 2

## 2017-11-08 MED FILL — INSULIN LISPRO 100 UNIT/ML INJECTION: 100 unit/mL | SUBCUTANEOUS | Qty: 1

## 2017-11-08 MED FILL — CALCIUM ACETATE 667 MG CAP: 667 mg | ORAL | Qty: 1

## 2017-11-08 MED FILL — PREDNISONE 5 MG TAB: 5 mg | ORAL | Qty: 2

## 2017-11-08 NOTE — Other (Signed)
1305 Assumed care of pat.

## 2017-11-08 NOTE — Progress Notes (Signed)
[facility] Critical Care, and Sleep Medicine  Name: Brittney Shelton MRN: [id number]   DOB: 1951-03-15 Hospital: [facility]   Date: 11/08/2017        IMPRESSION:   ?? Acute/chronic hypoxic/hypercapnic respiratory failure; on 4 LPm at home for many yrs since she lived in [place];  Cxr does NOT  Show increased edema so fear this  Is her pulm htn. Has been on bipap since yesterday. Seems to be tolerating pretty well. On FIo2 of 70%. Comfortable at present.   ?? Community acquired pneumonia rll- completed antibiotic course   ?? "COPD exacerbation" - she has a diagnosis of COPD, but she has no obstruction on PFTs; patient of Dr. Jayme Cloud  ?? Acute renal failure- numbers increased, K is increased, Will ask Renal to assist.   ?? Severe Pulmonary HTN with dilated RV and RV failure on echo; CTA negative for PE; Seems to be chronic. Has been tolerating Revatio 10mg  po tid. likley adanced, end stage PPHTN  ?? LVH/diastolic heart failure- stable  Volume at present   ?? Obesity - ?OSA/OHS  ?? Constipation, nausea- still an ongoing problem-  ?? Elevated lft's.   .  US showed hepatomegly and dilated portal vein with slow flow  and biliary sludge and gb wall thickening. -? Related to hepatic congestion vs other . Was seen by Gi  np yesterday.  Abd is non- tender today .   ?? DROP  IN PLATELETS- was  lovenox 40 mg bid for prophylaxis.  Lovenox held and hit / serotonin studies sent 2 days ago are still pd - stable today at 121k  ?? Dm -Monitoring Glycemic Control  ?? 6/6 Got Midline placed.   ?? Prognosis is guarded.  Spent  35 minutes in conversation with pt and and sister in  Law regarding situation and advanced care / code status option.  May consider hospice   If she gets no better.  Have held on consult for today to allow her some time to think and  For [place] to see how she does.   ?? Remains Critically ill, over 35 min CC, spent with pt. High risk of  decompensation. Using high risk meds. Being actively adjusted. Multiple organ failure.       PLAN:   ?? CCU  Monitoring - still at risk for decline and no wiggle room resp wise   ?? Keep prednisone at same dose for now.  ?? IV morphine prn SOB  ?? Nebulized morphine scheduled  ?? Will ask Renal to evaluate and assist in care.   ?? Will keep Sildenafil at same level for now.   ?? See if we can get to high flow again today   ?? She has not been stable enough  To go to v/q  Scanner from a  resp standpoint and  Therefore is not stable enough for rt heart cath at this time.   ?? Will ask PICC team to assess for PICC line versus, Midline. - we do not have access for lab draws   ?? Eventually needs diagnostic RHC, vasodilator trial - per report cards here would prefer this be done  By Dr. Fraser Din   ?? If she declines, will need intubation, Bipap for now.   ?? Incentive spirometer  ?? Bronchodilators - adjust to day time - done   ?? Now off ab   ?? Follow up cultures- done  And neg   ?? Diurese daily-  Is on hold -  am renal labs pd   ?? cxr and labs  Daily   ?? HIT AND SEROTONIN PANEL - HOLD LOVENOX - DISCUSSED WITH PHARMACIST AND RN   ?? scd  ?? Will eventually need sleep study  After dc   ?? Insulin   ?? DVT prophyaxis  ?? Miralax , Zofran  ?? Condition remains guarded.  Not yet stable enough to undergo v/q or additional testing out of icu.   ?? Check liver labs daily   ?? Consider Gi eval if lft's increased  ?? Would consider palliative care evaluation.   ?? Ongoing evaluations and treatments.        Subjective/Interval History:   I have reviewed the flowsheet and previous day???s notes.    6/9 sats drop quickly despite HFNC. " I'll do anything". Pt is tired and SOB. Explained hwo MOrphine can ehlp.     6/8 BIPAP all the time 14/10 and 80%- unable to wean says she feels better.    6/7 low dose sildenafil tolerated. Discussed case with Dr. Fraser DinJOhri and Dr. Modesto CharonWong. Dr. Fraser DinJOhri  Has reviewed the chart and we discussed  The fact that  she appears to have made no progress and actually is more bipap dependent now than she was earlier in the week.  Dr. Fraser DinJOhri thinks pt  May have end stage primary pulm htn.  There may be little we can do .  NOt able to have rt heart cath.  Dr. Fraser DinJOhri says it is ok to try sildenafil but do  So at a smaller starting dose of 10 mg.  Will start tonight and if she drops her bp will stop it.     Dr. Vevelyn PatMiksa had following conversation.  , detailed conversation with pt and her sister  In law.  I told her what we think is going on and that we are likely not going to be able to reverse this.  We discussed risks and possible benefit of siledenafil and what will likely be the outcome no matter what we do.  She may not survive this.  She appeared to understand.  She was ok with trial of low dose sildenafil.  IN the presence of her sister in law we discussed code status and she will be a  Full no code at this point.  NO shock, cpr , vent or code drug.  cAn use neo if she drops her bp on sildenafil but would not continue the trial if she does  So      11-05-17: Pt is alert and conversant this am on bipap. NO chest pain, no back pain. NO leg pain.   Denies any nausea.     11-04-17: Pt feels tired, weak this am. Has fatigue. She was feeling nauseated and sick to her stomach. No chest pain, no back pain, no abdominal pain, no leg pain.  Has poor appetite this am.       5/30 HFNC 45 LPM. OOB the other day. Slept without BIPAP. ROS otherwise negative    5/29 BIPAP then HFNC this AM. sats hovering close to 88%- 90%. No congestion. No fver. I > O since admission. No preipheral edema. CXr reviewed- no effusion    5/28 No acute events overnight.On bipap.   No severe distress    6/3 - was sleeping without any resp distress when I came in. As can best tell was on high flow overnight.   6/4- says breathing is ok . Admits to rlq discomfort on direst palp -  no rebound   6/6 - on bipap . No  resp complaints on bipap and no abd tenderness  But  does have nausea.     Objective:  Vital Signs:    Visit Vitals  BP 102/64   Pulse 87   Temp 97.8 ??F (36.6 ??C)   Resp 19   Ht 5\' 6"  (1.676 m)   Wt 137.1 kg (302 lb 4 oz)   SpO2 90%   BMI 48.78 kg/m??       O2 Device: BIPAP   O2 Flow Rate (L/min): 55 l/min   Temp (24hrs), Avg:97.7 ??F (36.5 ??C), Min:97.6 ??F (36.4 ??C), Max:97.8 ??F (36.6 ??C)     Intake/Output:   Last shift:      No intake/output data recorded.  Last 3 shifts: 06/07 1901 - 06/09 0700  In: 60 [P.O.:60]  Out: 800 [Urine:800]    Intake/Output Summary (Last 24 hours) at 11/08/2017 1036  Last data filed at 11/08/2017 0400  Gross per 24 hour   Intake ???   Output 600 ml   Net -600 ml     Physical exam  Gen: morbidly obese.  no distress  On bipap   heent - bipap  Neck -  nontender   cardiac - rrr , sinus   Lungs- minimal rales in bases posterior     abd - soft  And no tenderness   Ext-   Large with trace edema of bilateral lower extremities.    neuro - appears alert and  Oriented, seems grossly weak.    Psych: nonfocal. NO overt depression, no anxiety.     Data:     Current Facility-Administered Medications   Medication Dose Route Frequency   ??? morphine injection 2 mg  2 mg Inhalation Q4H RT   ??? albuterol-ipratropium (DUO-NEB) 2.5 MG-0.5 MG/3 ML  3 mL Nebulization Q6H RT   ??? bumetanide (BUMEX) injection 2 mg  2 mg IntraVENous Q12H   ??? patiromer calcium sorbitex (VELTASSA) powder 8.4 g  8.4 g Oral DAILY   ??? calcium acetate (PHOSLO) capsule 667 mg  1 Cap Oral TID WITH MEALS   ??? heparin (porcine) injection 5,000 Units  5,000 Units SubCUTAneous Q12H   ??? nystatin (MYCOSTATIN) 100,000 unit/gram powder   Topical BID   ??? metoprolol succinate (TOPROL-XL) XL tablet 12.5 mg  12.5 mg Oral DAILY   ??? sildenafil (antihypertensive) (REVATIO) tablet 10 mg  10 mg Oral TID   ??? PHENYLephrine (NEO-SYNEPHRINE) 30 mg in 0.9% sodium chloride 250 mL infusion  10-100 mcg/min IntraVENous TITRATE   ??? predniSONE (DELTASONE) tablet 10 mg  10 mg Oral DAILY WITH BREAKFAST    ??? senna-docusate (PERICOLACE) 8.6-50 mg per tablet 2 Tab  2 Tab Oral BID   ??? polyethylene glycol (MIRALAX) packet 17 g  17 g Oral DAILY   ??? insulin lispro (HUMALOG) injection   SubCUTAneous AC&HS   ??? sodium chloride (NS) flush 5-40 mL  5-40 mL IntraVENous Q8H   ??? aspirin delayed-release tablet 81 mg  81 mg Oral DAILY                Labs:  No results for input(s): WBC, HGB, HCT, PLT, HGBEXT, HCTEXT, PLTEXT, HGBEXT, HCTEXT, PLTEXT in the last 72 hours.  Recent Labs     11/07/17  0436   NA 133*   K 5.6*   CL 98   CO2 25   GLU 121*   BUN 95*   CREA 3.15*   CA 8.2*  MG 2.8*   PHOS 6.7*   ALB 3.4*   TBILI 1.9*   SGOT 448*   ALT 871*     No results for input(s): PHI, PCO2I, PO2I, HCO3I, FIO2I in the last 72 hours.  Imaging:  I have personally reviewed the patient???s radiographs and have reviewed the reports:  cxr portable: 11-05-17: IMPRESSION:  ?? no overt decompensated chf - mild increased markings  On portable film.           Thayer Headings, MD

## 2017-11-08 NOTE — Progress Notes (Signed)
[facility] Progress Note        NAME: Brittney Shelton       DOB:  [birth year]       MRN:  [id number]     Date/Time: 11/08/2017    Risk of deterioration: high       Assessment:    Plan:  AKI with hyperkalemia  Acute/chronic resp failure  Pulm HTN/dil RA/RV  OBesity  DM  PNA  Thrombocytopenia  Transaminitis -? Hepatic congestion Pt refused labs this am  D/w brother at bedside that she was doing poorly and very ill -I do not expect her to survive this hospitalization    aki multifactorial-seems to be endstage from her pulm htn/distolic heart failure and is currently a dnr. Pt has had microalbuminuria for years which is representative of underlying diabetic nephropathy  For the hyperkalemia have added veltassa.  I would not recommend HD for her as it would not reverse her underlying pulm htn/heart failure  Pred contributing to her elevated bun.  Expect that her renal function will decline further.  CXR not indicative of pulm edema, earlier cta (-) for pe, edema. Difficult to judge volume status from exam alone  Very tenuous status.  Long term prognosis very poor.    ? Hospice candidate           Subjective:     Chief Complaint:  Unable to give    Review of Systems: Patient was not able to provide review of systems due to mental status change/acute illness    Objective:     VITALS:   Last 24hrs VS reviewed since prior progress note. Most recent are:  Visit Vitals  BP 94/56   Pulse 80   Temp 96.2 ??F (35.7 ??C)   Resp 19   Ht 5\' 6"  (1.676 m)   Wt 137.1 kg (302 lb 4 oz)   SpO2 94%   BMI 48.78 kg/m??     SpO2 Readings from Last 6 Encounters:   11/08/17 94%   10/20/2017 (!) 55%   09/11/17 91%   07/30/17 90%   06/10/17 92%   06/04/17 93%    O2 Flow Rate (L/min): 55 l/min       Intake/Output Summary (Last 24 hours) at 11/08/2017 1444  Last data filed at 11/08/2017 0400  Gross per 24 hour   Intake ???   Output 600 ml   Net -600 ml        Telemetry Reviewed       PHYSICAL EXAM:    General   well developed, obese, AAF, on BIPAP, lethargic   EENT  Normocephalic, Atraumatic  Respiratory   Clear To Auscultation bilaterally  Cardiology  Regular Rate and Rythmn    Extremities  No clubbing, cyanosis, or edema. Pulses intact.              Lab Data Reviewed: (see below)    Medications Reviewed: (see below)    PMH/SH reviewed - no change compared to H&P  ___________________________________________________    ___________________________________________________    Attending Physician: Gary Fleet, MD     ____________________________________________________  MEDICATIONS:  Current Facility-Administered Medications   Medication Dose Route Frequency   ??? morphine injection 1 mg  1 mg IntraVENous Q3H PRN   ??? prochlorperazine (COMPAZINE) with saline injection 10 mg  10 mg IntraVENous Q6H PRN   ??? morphine injection 4 mg  4 mg Inhalation Q4H RT   ??? albuterol-ipratropium (DUO-NEB) 2.5 MG-0.5 MG/3 ML  3 mL Nebulization Q6H RT   ???  bumetanide (BUMEX) injection 2 mg  2 mg IntraVENous Q12H   ??? patiromer calcium sorbitex (VELTASSA) powder 8.4 g  8.4 g Oral DAILY   ??? calcium acetate (PHOSLO) capsule 667 mg  1 Cap Oral TID WITH MEALS   ??? heparin (porcine) injection 5,000 Units  5,000 Units SubCUTAneous Q12H   ??? nystatin (MYCOSTATIN) 100,000 unit/gram powder   Topical BID   ??? metoprolol succinate (TOPROL-XL) XL tablet 12.5 mg  12.5 mg Oral DAILY   ??? sildenafil (antihypertensive) (REVATIO) tablet 10 mg  10 mg Oral TID   ??? PHENYLephrine (NEO-SYNEPHRINE) 30 mg in 0.9% sodium chloride 250 mL infusion  10-100 mcg/min IntraVENous TITRATE   ??? predniSONE (DELTASONE) tablet 10 mg  10 mg Oral DAILY WITH BREAKFAST   ??? senna-docusate (PERICOLACE) 8.6-50 mg per tablet 2 Tab  2 Tab Oral BID   ??? artificial tears (dextran 70-hypromellose) (NATURAL BALANCE) 0.1-0.3 % ophthalmic solution 1 Drop  1 Drop Both Eyes PRN   ??? polyethylene glycol (MIRALAX) packet 17 g  17 g Oral DAILY   ??? dextrose 10% infusion 125-250 mL  125-250 mL IntraVENous PRN    ??? insulin lispro (HUMALOG) injection   SubCUTAneous AC&HS   ??? sodium chloride (NS) flush 5-40 mL  5-40 mL IntraVENous Q8H   ??? sodium chloride (NS) flush 5-40 mL  5-40 mL IntraVENous PRN   ??? acetaminophen (TYLENOL) tablet 650 mg  650 mg Oral Q6H PRN   ??? naloxone (NARCAN) injection 0.4 mg  0.4 mg IntraVENous PRN   ??? ondansetron (ZOFRAN) injection 4 mg  4 mg IntraVENous Q4H PRN   ??? bisacodyl (DULCOLAX) suppository 10 mg  10 mg Rectal DAILY PRN   ??? aspirin delayed-release tablet 81 mg  81 mg Oral DAILY   ??? glucose chewable tablet 16 g  4 Tab Oral PRN   ??? glucagon (GLUCAGEN) injection 1 mg  1 mg IntraMUSCular PRN        LABS:  No results for input(s): WBC, HGB, HCT, PLT, HGBEXT, HCTEXT, PLTEXT, HGBEXT, HCTEXT, PLTEXT in the last 72 hours.  Recent Labs     11/07/17  0436   NA 133*   K 5.6*   CL 98   CO2 25   BUN 95*   CREA 3.15*   GLU 121*   CA 8.2*   MG 2.8*   PHOS 6.7*     Recent Labs     11/07/17  0436   SGOT 448*   ALT 871*   AP 113   TBILI 1.9*   TP 6.6   ALB 3.4*   GLOB 3.2     No results for input(s): INR, PTP, APTT in the last 72 hours.    No lab exists for component: INREXT, INREXT   No results for input(s): FE, TIBC, PSAT, FERR in the last 72 hours.   No results for input(s): PH, PCO2, PO2 in the last 72 hours.  No results for input(s): CPK, CKNDX, TROIQ in the last 72 hours.    No lab exists for component: CPKMB  Lab Results   Component Value Date/Time    Glucose (POC) 124 (H) 11/08/2017 12:31 PM    Glucose (POC) 134 (H) 11/08/2017 08:37 AM    Glucose (POC) 214 (H) 11/07/2017 09:19 PM    Glucose (POC) 190 (H) 11/07/2017 05:04 PM    Glucose (POC) 156 (H) 11/07/2017 11:42 AM

## 2017-11-08 NOTE — Progress Notes (Addendum)
0700 Bedside and Verbal shift change report received from PepsiCouzen, Museum/gallery conservatorN (offgoing nurse). Report included the following information SBAR, Kardex, Intake/Output, MAR, Accordion and Recent Results.    0800 Assessment complete. See flowsheet for details.    0830 Dr. Jodie Echevariaan at bedside. Patient decompensates easily with small amount of activity in bed; still unable to tolerate even small amounts of PO; difficult stick and patient refusing to allow multiple tries for lab draws. Additional medication ordered for nausea.  Discussed palliative care needs.      1000 Discussed plan of care with Dr. Mora Applickman. MD aware that patient is refusing labs and unable to tolerate PO meds due to BIPAP requirements and recurrent nausea. MD has discussed prognosis with patient's brother.    1200 Reassessment unchanged. Will continue to monitor.    1300 Bedside shift change report given to Herbert SetaHeather, Charity fundraiserN (oncoming nurse). Report included the following information SBAR, Kardex, Intake/Output, MAR, Accordion and Recent Results.

## 2017-11-08 NOTE — Progress Notes (Signed)
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  Started low dose sildenafil but not much improvement  -completed Levaquin. On low dose prednisone and scheduled duonebs and bumex  -patient has clinically declined and appears tired  ??  Acute kidney injury POA  Hyperkalemia  -Cr trending up.  Not dialysis candidate.   ??  Essential HTN POA  -holding BP meds.  On vasopressor prn  ??  DM type 2 POA  Diet controlled at home; Last HgBa1c 6.4 in Dec 2018  SSI prn  ??  ??  Nausea/vomiting  Constipation resolved   Elevated LFTs: r/o biliary colic   -KUB 6/2 normal   -Abd US Hepatomegaly.  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  Biliary sludge with mild gallbladder wall thickening.    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    Code status: Full  Prophylaxis: Hep SQ     Recommended Disposition: remain in ICU      Subjective:     Chief Complaint / Reason for Physician Visit:   follow up respiratory failure/ copd   Discussed with RN events overnight.       Review of Systems:  Symptom Y/N Comments  Symptom Y/N Comments   Fever/Chills n   Chest Pain n    Poor Appetite    Edema     Cough    Abdominal Pain n    Sputum    Joint Pain     SOB/DOE    Pruritis/Rash     Nausea/vomit n   Tolerating PT/OT     Diarrhea n   Tolerating Diet     Constipation n   Other       Could NOT obtain due to:      Objective:     VITALS:   Last 24hrs VS reviewed since prior progress note. Most recent are:   Patient Vitals for the past 24 hrs:   Temp Pulse Resp BP SpO2   11/08/17 2331 ??? ??? ??? ??? (!) 86 %   11/08/17 2300 ??? 85 23 99/64 91 %   11/08/17 2200 ??? 88 22 ??? 94 %   11/08/17 2100 ??? 89 24 90/64 93 %   11/08/17 2000 97.7 ??F (36.5 ??C) 84 25 97/48 91 %   11/08/17 1938 ??? ??? ??? ??? 94 %   11/08/17 1900 ??? 87 23 93/60 96 %   11/08/17 1800 ??? 85 27 95/55 93 %   11/08/17 1751 ??? 87 22 95/69 95 %   11/08/17 1624 96.1 ??F (35.6 ??C) ??? ??? ??? ???   11/08/17 1600 ??? 84 21 112/66 93 %   11/08/17 1500 ??? 83 23 105/67 91 %   11/08/17 1402 ??? ??? ??? ??? 94 %   11/08/17 1400 ??? 83 22 105/56 94 %   11/08/17 1300 ???  80 19 (!) 77/57 94 %   11/08/17 1200 96.2 ??F (35.7 ??C) 82 18 95/62 92 %   11/08/17 1126 ??? ??? ??? ??? 91 %   11/08/17 1000 ??? 84 19 105/68 97 %   11/08/17 0900 ??? 87 19 102/64 90 %   11/08/17 0833 ??? ??? ??? ??? 96 %   11/08/17 0800 96.1 ??F (35.6 ??C) 86 25 113/62 (!) 50 %   11/08/17 0734 ??? ??? ??? ??? (!) 86 %   11/08/17 0700 ??? 86 18 112/62 (!) 89 %   11/08/17 0600 ??? 85 27 95/54 (!) 88 %   11/08/17 0551 ??? ??? ??? ??? 92 %   11/08/17 0500 ??? 81 17 107/69 (!) 88 %   11/08/17 0409 ??? ??? ??? ??? 90 %   11/08/17 0400 97.8 ??F (36.6 ??C) 82 24 102/65 95 %   11/08/17 0300 ??? 82 20 (!) 89/56 92 %   11/08/17 0200 ??? 81 23 (!) 80/52 93 %   11/08/17 0129 ??? ??? ??? ??? 90 %   11/08/17 0100 ??? 81 20 (!) 76/52 (!) 86 %   11/08/17 0016 ??? ??? ??? ??? 93 %   11/08/17 0000 97.7 ??F (36.5 ??C) 83 22 (!) 88/60 93 %       Intake/Output Summary (Last 24 hours) at 11/08/2017 2347  Last data filed at 11/08/2017 1806  Gross per 24 hour   Intake 100 ml   Output 500 ml   Net -400 ml        PHYSICAL EXAM:  General: WD, WN. Alert, cooperative, no acute distress, morbidly obese  EENT:  EOMI. Anicteric sclerae. MMM  Resp:  Exam limited by habitus, no wheezing or rales  CV:  Regular  Rhythm,??+ edema  GI:  Soft, Non distended, Non tender. ??+Bowel sounds  Neurologic:?? Alert and oriented X 3, normal speech,   Psych:???? Fair insight.??Not anxious nor agitated    Reviewed most current lab test results and cultures  YES   Reviewed most current radiology test results   YES  Review and summation of old records today    NO  Reviewed patient's current orders and MAR    YES  PMH/SH reviewed - no change compared to H&P  ________________________________________________________________________  Care Plan discussed with:    Comments   Patient y    Family      RN y    Buyer, retail                        Multidiciplinary team rounds were held today with case manager, nursing, pharmacist and Higher education careers adviser.  Patient's plan of care was discussed; medications were reviewed and discharge planning was addressed.     ________________________________________________________________________  Total NON critical care TIME:  25  Minutes    Total CRITICAL CARE TIME Spent:   Minutes non procedure based      Comments   >50% of visit spent in counseling and coordination of care     ________________________________________________________________________  Pamalee Leyden, MD     Procedures: see electronic medical records for all procedures/Xrays and details which were not copied into this note but were reviewed prior to creation of Plan.      LABS:  I reviewed today's most current labs and imaging studies.  Pertinent labs include:  No results for input(s): WBC, HGB, HCT, PLT, HGBEXT, HCTEXT, PLTEXT, HGBEXT, HCTEXT, PLTEXT in the last 72  hours.  Recent Labs     11/07/17  0436   NA 133*   K 5.6*   CL 98   CO2 25   GLU 121*   BUN 95*   CREA 3.15*   CA 8.2*   MG 2.8*   PHOS 6.7*   ALB 3.4*   TBILI 1.9*   SGOT 448*   ALT 871*       Signed: Pamalee Leyden, MD

## 2017-11-09 LAB — GLUCOSE, POC
Glucose (POC): 136 mg/dL — ABNORMAL HIGH (ref 65–100)
Glucose (POC): 144 mg/dL — ABNORMAL HIGH (ref 65–100)
Glucose (POC): 159 mg/dL — ABNORMAL HIGH (ref 65–100)
Glucose (POC): 213 mg/dL — ABNORMAL HIGH (ref 65–100)

## 2017-11-09 MED ORDER — SILDENAFIL 20 MG TAB
20 mg | Freq: Three times a day (TID) | ORAL | Status: DC
Start: 2017-11-09 — End: 2017-11-10
  Administered 2017-11-10: 14:00:00 via ORAL

## 2017-11-09 MED FILL — REVATIO 20 MG TABLET: 20 mg | ORAL | Qty: 1

## 2017-11-09 MED FILL — MORPHINE 4 MG/ML SYRINGE: 4 mg/mL | INTRAMUSCULAR | Qty: 1

## 2017-11-09 MED FILL — CALCIUM ACETATE 667 MG CAP: 667 mg | ORAL | Qty: 1

## 2017-11-09 MED FILL — ASPIRIN 81 MG TAB, DELAYED RELEASE: 81 mg | ORAL | Qty: 1

## 2017-11-09 MED FILL — BUMETANIDE 0.25 MG/ML IJ SOLN: 0.25 mg/mL | INTRAMUSCULAR | Qty: 10

## 2017-11-09 MED FILL — IPRATROPIUM-ALBUTEROL 2.5 MG-0.5 MG/3 ML NEB SOLUTION: 2.5 mg-0.5 mg/3 ml | RESPIRATORY_TRACT | Qty: 3

## 2017-11-09 MED FILL — PREDNISONE 5 MG TAB: 5 mg | ORAL | Qty: 2

## 2017-11-09 MED FILL — DOK PLUS 8.6 MG-50 MG TABLET: ORAL | Qty: 2

## 2017-11-09 MED FILL — MAPAP (ACETAMINOPHEN) 325 MG TABLET: 325 mg | ORAL | Qty: 2

## 2017-11-09 MED FILL — HEPARIN (PORCINE) 5,000 UNIT/ML IJ SOLN: 5000 unit/mL | INTRAMUSCULAR | Qty: 1

## 2017-11-09 MED FILL — BD POSIFLUSH NORMAL SALINE 0.9 % INJECTION SYRINGE: INTRAMUSCULAR | Qty: 20

## 2017-11-09 MED FILL — ONDANSETRON (PF) 4 MG/2 ML INJECTION: 4 mg/2 mL | INTRAMUSCULAR | Qty: 2

## 2017-11-09 MED FILL — TOPROL XL 25 MG TABLET,EXTENDED RELEASE: 25 mg | ORAL | Qty: 1

## 2017-11-09 MED FILL — HEALTHYLAX 17 GRAM ORAL POWDER PACKET: 17 gram | ORAL | Qty: 1

## 2017-11-09 NOTE — Other (Signed)
Interdisciplinary team rounds were held 11/09/2017 with the following team members:Care Management, Diabetes Treatment Specialist, Nursing, Nutrition, Pharmacy, Physician and Clinical Coordinator.    Plan of care discussed. See clinical pathway and/or care plan for interventions and desired outcomes.

## 2017-11-09 NOTE — Progress Notes (Signed)
Hospitalist Progress Note  NAME: Brittney Shelton   DOB:  July 26, 1950   MRN:  [id number]       Assessment / Plan:  Acute hypercapneic  respiratory failure likely secondary to COPD POA inproving   On bipap high flow pulm on case ,    Acute on chronic diastolic heart failure improving continue diuresis     Core Pulmonale secondary to pulmonary HTN continue oxygen therapy, pall med poor prognosis ,    Suspected pna  Completed levaquin     AKI POA appear to be worsening nephro on case f/u recommendations,avoid nephrotoxic meds ,    Hyperkalemia likely secondary to AKI monitor closely     Acute COPD exacerbation continue bronchodilators, steroids , respiratory status,     DM2 continue sliding scale  POC     Nausea vomiting resolved     Thrombocytopenia resolved     Body mass index is 47.68 kg/m??.    Code status: full   Prophylaxis: heparin SQ  Recommended Disposition: Home      Subjective:     Chief Complaint / Reason for Physician Visit    Discussed with RN events overnight.     Review of Systems:  Symptom Y/N Comments  Symptom Y/N Comments   Fever/Chills    Chest Pain     Poor Appetite    Edema     Cough    Abdominal Pain     Sputum    Joint Pain     SOB/DOE    Pruritis/Rash     Nausea/vomit    Tolerating PT/OT     Diarrhea    Tolerating Diet     Constipation    Other       Could NOT obtain due to:      Objective:     VITALS:   Last 24hrs VS reviewed since prior progress note. Most recent are:  Patient Vitals for the past 24 hrs:   Temp Pulse Resp BP SpO2   11/09/17 1507 ??? ??? ??? ??? 98 %   11/09/17 1400 ??? 85 25 100/48 (!) 83 %   11/09/17 1311 ??? 88 24 103/55 93 %   11/09/17 1225 98.5 ??F (36.9 ??C) 87 29 99/56 94 %   11/09/17 0900 ??? 88 (!) 32 (!) 89/66 (!) 88 %   11/09/17 0841 ??? 88 ??? 95/59 ???   11/09/17 0815 97.7 ??F (36.5 ??C) 88 25 95/59 93 %   11/09/17 0700 ??? 88 26 ??? 96 %   11/09/17 0600 ??? 89 28 90/50 (!) 83 %   11/09/17 0500 ??? 88 29 (!) 89/56 (!) 86 %   11/09/17 0422 ??? ??? ??? ??? 92 %    11/09/17 0400 97.7 ??F (36.5 ??C) 89 26 93/60 95 %   11/09/17 0300 ??? 88 20 99/62 94 %   11/09/17 0213 ??? ??? ??? ??? 94 %   11/09/17 0200 ??? 88 27 104/58 ???   11/09/17 0100 ??? 87 24 93/58 92 %   11/09/17 0000 97.7 ??F (36.5 ??C) 85 24 92/52 91 %   11/08/17 2331 ??? ??? ??? ??? (!) 86 %   11/08/17 2300 ??? 85 23 99/64 91 %   11/08/17 2200 ??? 88 22 ??? 94 %   11/08/17 2100 ??? 89 24 90/64 93 %   11/08/17 2000 97.7 ??F (36.5 ??C) 84 25 97/48 91 %   11/08/17 1938 ??? ??? ??? ??? 94 %   11/08/17 1900 ??? 87  23 93/60 96 %   11/08/17 1800 ??? 85 27 95/55 93 %   11/08/17 1751 ??? 87 22 95/69 95 %   11/08/17 1624 96.1 ??F (35.6 ??C) ??? ??? ??? ???   11/08/17 1600 ??? 84 21 112/66 93 %       Intake/Output Summary (Last 24 hours) at 11/09/2017 1524  Last data filed at 11/09/2017 1225  Gross per 24 hour   Intake 100 ml   Output 850 ml   Net -750 ml        PHYSICAL EXAM:  General: WD, WN. Alert, cooperative, no acute distress????  EENT:  EOMI. Anicteric sclerae. MMM  Resp:  Diminished , no wheezes, no crackles , no use of accesory muscles   CV:  Regular  rhythm,?? No edema  GI:  Soft, Non distended, Non tender. ??+Bowel sounds  Neurologic:?? Alert and oriented X 3, normal speech,   Psych:???? Good insight.??Not anxious nor agitated  Skin:  No rashes.  No jaundice    Reviewed most current lab test results and cultures  YES  Reviewed most current radiology test results   YES  Review and summation of old records today    NO  Reviewed patient's current orders and MAR    YES  PMH/SH reviewed - no change compared to H&P  ________________________________________________________________________  Care Plan discussed with:    Comments   Patient x    Family      RN x    Care Manager     Consultant                        Multidiciplinary team rounds were held today with case manager, nursing, pharmacist and Higher education careers adviser.  Patient's plan of care was discussed; medications were reviewed and discharge planning was addressed.      ________________________________________________________________________      Total CRITICAL CARE TIME Spent:   Minutes non procedure based      Comments   >50% of visit spent in counseling and coordination of care     ________________________________________________________________________  Elby Showers, MD     Procedures: see electronic medical records for all procedures/Xrays and details which were not copied into this note but were reviewed prior to creation of Plan.      LABS:  I reviewed today's most current labs and imaging studies.  Pertinent labs include:  No results for input(s): WBC, HGB, HCT, PLT, HGBEXT, HCTEXT, PLTEXT in the last 72 hours.  Recent Labs     11/07/17  0436   NA 133*   K 5.6*   CL 98   CO2 25   GLU 121*   BUN 95*   CREA 3.15*   CA 8.2*   MG 2.8*   PHOS 6.7*   ALB 3.4*   TBILI 1.9*   SGOT 448*   ALT 871*       Signed: Hazaiah [place] Lars Pinks, MD

## 2017-11-09 NOTE — Progress Notes (Signed)
Nutrition Assessment:    RECOMMENDATIONS:   Continue diet and supplements  Antiemetics prn    ASSESSMENT:   Chart reviewed, case discussed during CCU rounds.  Pt on hiflow, continues with SOB.  Per Nephrology she is not really an HD candidate.  PO intake has been minimal.  She continues to c/o nausea and reported vomiting today per Nephrology(?).  Pt is taking zofran daily but also has compazine available prn.  K continues to trend up, now 5.6.  She is on veltassa.  Doubt PO intake is contributing much to hyperkalemia as her appetite has been very poor.     Dietitians Intervention(s)/Plan(s): Continue diet and supplements, monitor labs, monitor plan of care, encourage PO intake  SUBJECTIVE/OBJECTIVE:     Diet Order: Consistent carb, Renal, Other (comment)(Nepro TID )  % Eaten:  No data found.    Pertinent Medications:bumex, phoslo, humalog, miralax, prednisone, veltassa, pericolace.     Chemistries:  Lab Results   Component Value Date/Time    Sodium 133 (L) 11/07/2017 04:36 AM    Potassium 5.6 (H) 11/07/2017 04:36 AM    Chloride 98 11/07/2017 04:36 AM    CO2 25 11/07/2017 04:36 AM    Anion gap 10 11/07/2017 04:36 AM    Glucose 121 (H) 11/07/2017 04:36 AM    BUN 95 (H) 11/07/2017 04:36 AM    Creatinine 3.15 (H) 11/07/2017 04:36 AM    BUN/Creatinine ratio 30 (H) 11/07/2017 04:36 AM    GFR est AA 18 (L) 11/07/2017 04:36 AM    GFR est non-AA 15 (L) 11/07/2017 04:36 AM    Calcium 8.2 (L) 11/07/2017 04:36 AM    Albumin 3.4 (L) 11/07/2017 04:36 AM      Anthropometrics: Height: 5' 6"  (167.6 cm) Weight: 134 kg (295 lb 6.7 oz)   [] bed scale    [] stated   [] unknown     IBW (%IBW):   ( ) UBW (%UBW):   (  %)    BMI: Body mass index is 47.68 kg/m??.    This BMI is indicative of:  [] Underweight   [] Normal   [] Overweight   []  Obesity   [x]  Extreme Obesity (BMI>40)  Estimated Nutrition Needs (Based on): 2058 Kcals/day(MSJ 1871 x 1.1) , 88 g(1.5gPro/kg IBW ) Protein  Carbohydrate: At Least 130 g/day  Fluids: 1800 mL/day or per MD      Last BM: 6/3   [] Active     [] Hyperactive  [x] Hypoactive       []  Absent   BS  Skin:    [x]  Intact   []  Incision  []  Breakdown   []  DTI   []  Tears/Excoriation/Abrasion  [x] Edema(+1 nonpitting-BUE; +2 pitting-BLE) []  Other:   Wt Readings from Last 30 Encounters:   11/09/17 134 kg (295 lb 6.7 oz)   09/11/17 119.2 kg (262 lb 11.2 oz)   07/30/17 118.4 kg (261 lb)   06/19/17 117 kg (258 lb)   05/29/17 117.5 kg (259 lb)   05/28/17 117.7 kg (259 lb 8.4 oz)   05/18/17 117.5 kg (259 lb)   05/06/17 117.9 kg (260 lb)   05/01/17 118.6 kg (261 lb 8.5 oz)   04/30/17 118.8 kg (261 lb 12.8 oz)   04/28/17 118.8 kg (261 lb 12.8 oz)   04/28/17 118.8 kg (261 lb 12.8 oz)   04/04/17 119.4 kg (263 lb 4.8 oz)   01/06/17 135.2 kg (298 lb)   12/17/16 142.9 kg (315 lb)   12/13/16 136.1 kg (300 lb)   12/10/16 136.1 kg (  300 lb)   09/12/16 131.2 kg (289 lb 4.8 oz)   08/06/16 130.6 kg (288 lb)   07/08/16 131.5 kg (290 lb)   06/20/16 132 kg (291 lb)   06/12/16 132.5 kg (292 lb)   05/21/16 134.7 kg (297 lb)   03/13/16 135.3 kg (298 lb 3.2 oz)   03/04/16 132.9 kg (293 lb)   02/29/16 133.8 kg (295 lb)   02/26/16 134.5 kg (296 lb 8 oz)   02/18/16 135.2 kg (298 lb)   02/13/16 135.6 kg (299 lb)   02/11/16 135.4 kg (298 lb 9.6 oz)      NUTRITION DIAGNOSES:   Problem:  Inadequate protein-energy intake      Etiology: related to Poor appetite      Signs/Symptoms: as evidenced by PO intake <25 % of meals     Previous dx re: inadequate protein energy intake continues.     NUTRITION INTERVENTIONS:  Meals/Snacks: General/healthful diet   Supplements: Commercial supplement              GOAL:   Pt will consume >50% of meals/supplements with K trending towards WNL in 2-4 days.     NUTRITION MONITORING AND EVALUATION   Previous Goal: PO intake >70% of meals/snacks next 3-5 days   Previous Goal Met: No   Previous Recommendations Implemented: Yes   Cultural, Religious, or Ethnic Dietary Needs: None   LEARNING NEEDS (Diet, Food/Nutrient-Drug Interaction):     [x]  None Identified   []  Identified and Education Provided/Documented   []  Identified and Pt declined/was not appropriate      [x]  Interdisciplinary Care Plan Reviewed/Documented    [x]  Participated in Discharge Planning: To be determined    [x]  Interdisciplinary Rounds     NUTRITION RISK:    [x]  High              []  Moderate           []   Low  []   Minimal/Uncompromised      Earney Mallet, RD, [place]  Pager [phone]  Weekend Pager [phone]

## 2017-11-09 NOTE — Progress Notes (Signed)
[facility] Progress Note        NAME: Brittney Shelton       DOB:  1950/07/31       MRN:  [id number]     Date/Time: 11/09/2017    Risk of deterioration: high       Assessment:    Plan:  AKI with hyperkalemia  Acute/chronic resp failure  Pulm HTN/dil RA/RV  OBesity  DM  PNA  Thrombocytopenia  Transaminitis -? Hepatic congestion Will send labs today.    aki multifactorial-seems to be endstage from her pulm htn/distolic heart failure and is currently a dnr.    Pt has had microalbuminuria for years which is representative of underlying diabetic nephropathy    For the hyperkalemia have added veltassa.    I would not recommend HD for her as it would not reverse her underlying pulm htn/heart failure    Pred contributing to her elevated bun.    CXR not indicative of pulm edema, earlier cta (-) for pe, edema. Difficult to judge volume status from exam alone    Very tenuous status.    Prognosis very poor.    Seems like hospice would be reasonable.  Palliative care following.           Subjective:     Chief Complaint:  "I can't eat anything."  +nausea.  +emesis.  +dyspnea.  -CP.  Weak.    Review of Systems: negative except as in HPI    Objective:     VITALS:   Last 24hrs VS reviewed since prior progress note. Most recent are:  Visit Vitals  BP (!) 89/66   Pulse 88   Temp 97.7 ??F (36.5 ??C)   Resp (!) 32   Ht 5\' 6"  (1.676 m)   Wt 134 kg (295 lb 6.7 oz)   SpO2 (!) 88%   BMI 47.68 kg/m??     SpO2 Readings from Last 6 Encounters:   11/09/17 (!) 88%   10/02/2017 (!) 55%   09/11/17 91%   07/30/17 90%   06/10/17 92%   06/04/17 93%    O2 Flow Rate (L/min): 55 l/min       Intake/Output Summary (Last 24 hours) at 11/09/2017 0936  Last data filed at 11/09/2017 0645  Gross per 24 hour   Intake 100 ml   Output 800 ml   Net -700 ml        Telemetry Reviewed       PHYSICAL EXAM:    General   On resp support.  Rhonchi bilaterally  +edema                Lab Data Reviewed: (see below)    Medications Reviewed: (see below)     PMH/SH reviewed - no change compared to H&P  ___________________________________________________    ___________________________________________________    Attending Physician: Ashley RoyaltyGerald E Keightley III, MD     ____________________________________________________  MEDICATIONS:  Current Facility-Administered Medications   Medication Dose Route Frequency   ??? sildenafil (antihypertensive) (REVATIO) tablet 20 mg  20 mg Oral TID   ??? morphine injection 1 mg  1 mg IntraVENous Q3H PRN   ??? prochlorperazine (COMPAZINE) with saline injection 10 mg  10 mg IntraVENous Q6H PRN   ??? morphine injection 4 mg  4 mg Inhalation Q4H RT   ??? albuterol-ipratropium (DUO-NEB) 2.5 MG-0.5 MG/3 ML  3 mL Nebulization Q6H RT   ??? bumetanide (BUMEX) injection 2 mg  2 mg IntraVENous Q12H   ??? patiromer calcium sorbitex (VELTASSA)  powder 8.4 g  8.4 g Oral DAILY   ??? calcium acetate (PHOSLO) capsule 667 mg  1 Cap Oral TID WITH MEALS   ??? heparin (porcine) injection 5,000 Units  5,000 Units SubCUTAneous Q12H   ??? nystatin (MYCOSTATIN) 100,000 unit/gram powder   Topical BID   ??? metoprolol succinate (TOPROL-XL) XL tablet 12.5 mg  12.5 mg Oral DAILY   ??? PHENYLephrine (NEO-SYNEPHRINE) 30 mg in 0.9% sodium chloride 250 mL infusion  10-100 mcg/min IntraVENous TITRATE   ??? predniSONE (DELTASONE) tablet 10 mg  10 mg Oral DAILY WITH BREAKFAST   ??? senna-docusate (PERICOLACE) 8.6-50 mg per tablet 2 Tab  2 Tab Oral BID   ??? artificial tears (dextran 70-hypromellose) (NATURAL BALANCE) 0.1-0.3 % ophthalmic solution 1 Drop  1 Drop Both Eyes PRN   ??? polyethylene glycol (MIRALAX) packet 17 g  17 g Oral DAILY   ??? dextrose 10% infusion 125-250 mL  125-250 mL IntraVENous PRN   ??? insulin lispro (HUMALOG) injection   SubCUTAneous AC&HS   ??? sodium chloride (NS) flush 5-40 mL  5-40 mL IntraVENous Q8H   ??? sodium chloride (NS) flush 5-40 mL  5-40 mL IntraVENous PRN   ??? acetaminophen (TYLENOL) tablet 650 mg  650 mg Oral Q6H PRN    ??? naloxone (NARCAN) injection 0.4 mg  0.4 mg IntraVENous PRN   ??? ondansetron (ZOFRAN) injection 4 mg  4 mg IntraVENous Q4H PRN   ??? bisacodyl (DULCOLAX) suppository 10 mg  10 mg Rectal DAILY PRN   ??? aspirin delayed-release tablet 81 mg  81 mg Oral DAILY   ??? glucose chewable tablet 16 g  4 Tab Oral PRN   ??? glucagon (GLUCAGEN) injection 1 mg  1 mg IntraMUSCular PRN        LABS:  No results for input(s): WBC, HGB, HCT, PLT, HGBEXT, HCTEXT, PLTEXT, HGBEXT, HCTEXT, PLTEXT in the last 72 hours.  Recent Labs     11/07/17  0436   NA 133*   K 5.6*   CL 98   CO2 25   BUN 95*   CREA 3.15*   GLU 121*   CA 8.2*   MG 2.8*   PHOS 6.7*     Recent Labs     11/07/17  0436   SGOT 448*   ALT 871*   AP 113   TBILI 1.9*   TP 6.6   ALB 3.4*   GLOB 3.2     No results for input(s): INR, PTP, APTT in the last 72 hours.    No lab exists for component: INREXT, INREXT   No results for input(s): FE, TIBC, PSAT, FERR in the last 72 hours.   No results for input(s): PH, PCO2, PO2 in the last 72 hours.  No results for input(s): CPK, CKNDX, TROIQ in the last 72 hours.    No lab exists for component: CPKMB  Lab Results   Component Value Date/Time    Glucose (POC) 144 (H) 11/09/2017 08:17 AM    Glucose (POC) 136 (H) 11/08/2017 10:21 PM    Glucose (POC) 124 (H) 11/08/2017 12:31 PM    Glucose (POC) 134 (H) 11/08/2017 08:37 AM    Glucose (POC) 214 (H) 11/07/2017 09:19 PM

## 2017-11-09 NOTE — Progress Notes (Signed)
[facility], Critical Care, and Sleep Medicine  Name: Brittney Shelton MRN: [id number]   DOB: [birth year] Hospital: [facility]   Date: 11/09/2017        IMPRESSION:   ?? Acute/chronic hypoxic/hypercapnic respiratory failure; on 4 LPm at home for many yrs since she lived in [place];  Cxr does NOT  Show increased edema so fear this  Is her pulm HTN  ?? Community acquired pneumonia rll- completed antibiotic course   ?? "COPD exacerbation" - she has a diagnosis of COPD, but she has no obstruction on PFTs; patient of Dr. Jayme Cloud  ?? Acute renal failure   ?? Severe Pulmonary HTN with dilated RV and RV failure on echo; CTA negative for PE   ?? LVH/diastolic heart failure  ?? Obesity - ?OSA/OHS  ?? Elevated lft's.   .  US showed hepatomegly and dilated portal vein with slow flow  and biliary sludge and gb wall thickening. -? Related to hepatic congestion vs other    ?? 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 DM      PLAN:   ?? Continue high flow O2 with BiPAP as required  ?? Keep prednisone at same dose for now.  ?? IV morphine prn SOB  ?? Nebulized morphine scheduled  ?? Will cautiously increase dose of sildenafil up to 20 tid, currently 10 tid  ?? She has not been stable enough for RHC  ?? Eventually needs diagnostic RHC, vasodilator trial - per report cardiology here would prefer this be done  by Dr. Fraser Din    ?? If she declines, will need intubation   ?? Incentive spirometer  ?? Bronchodilators   ?? Off antibiotics   ?? Nephrology following  ?? Will eventually need sleep study   ?? Insulin   ?? DVT prophylaxis  ?? DNR. Prognosis remains guarded at best.       Subjective/Interval History:   I have reviewed the flowsheet and previous day???s notes.    11-09-17: remains dependent on high flow O2  6/9 sats drop quickly despite HFNC. " I'll do anything". Pt is tired and SOB. Explained hwo MOrphine can ehlp.     6/8 BIPAP all the time 14/10 and 80%- unable to wean says she feels better.     6/7 low dose sildenafil tolerated. Discussed case with Dr. Fraser Din and Dr. Modesto Charon. Dr. Fraser Din  Has reviewed the chart and we discussed  The fact that she appears to have made no progress and actually is more bipap dependent now than she was earlier in the week.  Dr. Fraser Din thinks pt  May have end stage primary pulm htn.  There may be little we can do .  NOt able to have rt heart cath.  Dr. Fraser Din says it is ok to try sildenafil but do  So at a smaller starting dose of 10 mg.  Will start tonight and if she drops her bp will stop it.     Dr. Vevelyn Pat had following conversation.  , detailed conversation with pt and her sister  In law.  I told her what we think is going on and that we are likely not going to be able to reverse this.  We discussed risks and possible benefit of siledenafil and what will likely be the outcome no matter what we do.  She may not survive this.  She appeared to understand.  She was ok with trial of low dose sildenafil.  IN the presence of her  sister in law we discussed code status and she will be a  Full no code at this point.  NO shock, cpr , vent or code drug.  cAn use neo if she drops her bp on sildenafil but would not continue the trial if she does  So      11-05-17: Pt is alert and conversant this am on bipap. NO chest pain, no back pain. NO leg pain.   Denies any nausea.     11-04-17: Pt feels tired, weak this am. Has fatigue. She was feeling nauseated and sick to her stomach. No chest pain, no back pain, no abdominal pain, no leg pain.  Has poor appetite this am.       5/30 HFNC 45 LPM. OOB the other day. Slept without BIPAP. ROS otherwise negative    5/29 BIPAP then HFNC this AM. sats hovering close to 88%- 90%. No congestion. No fver. I > O since admission. No preipheral edema. CXr reviewed- no effusion    5/28 No acute events overnight.On bipap.   No severe distress    6/3 - was sleeping without any resp distress when I came in. As can best tell was on high flow overnight.    6/4- says breathing is ok . Admits to rlq discomfort on direst palp - no rebound   6/6 - on bipap . No  resp complaints on bipap and no abd tenderness  But does have nausea.     Objective:  Vital Signs:    Visit Vitals  BP 90/50   Pulse 88   Temp 97.7 ??F (36.5 ??C)   Resp 26   Ht 5\' 6"  (1.676 m)   Wt 134 kg (295 lb 6.7 oz)   SpO2 96%   BMI 47.68 kg/m??       O2 Device: Heated, Hood, Humidifier   O2 Flow Rate (L/min): 55 l/min   Temp (24hrs), Avg:96.9 ??F (36.1 ??C), Min:96.1 ??F (35.6 ??C), Max:97.7 ??F (36.5 ??C)     Intake/Output:   Last shift:      No intake/output data recorded.  Last 3 shifts: 06/08 1901 - 06/10 0700  In: 100 [P.O.:100]  Out: 1100 [Urine:1100]    Intake/Output Summary (Last 24 hours) at 11/09/2017 0706  Last data filed at 11/09/2017 0645  Gross per 24 hour   Intake 100 ml   Output 800 ml   Net -700 ml     Physical exam  Gen: morbidly obese.  no distress  On bipap   heent - bipap  Neck -  nontender   cardiac - rrr , sinus   Lungs- minimal rales in bases posterior     abd - soft  And no tenderness   Ext-   Large with trace edema of bilateral lower extremities.    neuro - appears alert and  Oriented, seems grossly weak.    Psych: nonfocal. NO overt depression, no anxiety.     Data:     Current Facility-Administered Medications   Medication Dose Route Frequency   ??? morphine injection 4 mg  4 mg Inhalation Q4H RT   ??? albuterol-ipratropium (DUO-NEB) 2.5 MG-0.5 MG/3 ML  3 mL Nebulization Q6H RT   ??? bumetanide (BUMEX) injection 2 mg  2 mg IntraVENous Q12H   ??? patiromer calcium sorbitex (VELTASSA) powder 8.4 g  8.4 g Oral DAILY   ??? calcium acetate (PHOSLO) capsule 667 mg  1 Cap Oral TID WITH MEALS   ??? heparin (porcine) injection 5,000 Units  5,000 Units SubCUTAneous Q12H   ??? nystatin (MYCOSTATIN) 100,000 unit/gram powder   Topical BID   ??? metoprolol succinate (TOPROL-XL) XL tablet 12.5 mg  12.5 mg Oral DAILY   ??? sildenafil (antihypertensive) (REVATIO) tablet 10 mg  10 mg Oral TID    ??? PHENYLephrine (NEO-SYNEPHRINE) 30 mg in 0.9% sodium chloride 250 mL infusion  10-100 mcg/min IntraVENous TITRATE   ??? predniSONE (DELTASONE) tablet 10 mg  10 mg Oral DAILY WITH BREAKFAST   ??? senna-docusate (PERICOLACE) 8.6-50 mg per tablet 2 Tab  2 Tab Oral BID   ??? polyethylene glycol (MIRALAX) packet 17 g  17 g Oral DAILY   ??? insulin lispro (HUMALOG) injection   SubCUTAneous AC&HS   ??? sodium chloride (NS) flush 5-40 mL  5-40 mL IntraVENous Q8H   ??? aspirin delayed-release tablet 81 mg  81 mg Oral DAILY                Labs:  No results for input(s): WBC, HGB, HCT, PLT, HGBEXT, HCTEXT, PLTEXT, HGBEXT, HCTEXT, PLTEXT in the last 72 hours.  Recent Labs     11/07/17  0436   NA 133*   K 5.6*   CL 98   CO2 25   GLU 121*   BUN 95*   CREA 3.15*   CA 8.2*   MG 2.8*   PHOS 6.7*   ALB 3.4*   TBILI 1.9*   SGOT 448*   ALT 871*     No results for input(s): PHI, PCO2I, PO2I, HCO3I, FIO2I in the last 72 hours.  Imaging:  I have personally reviewed the patient???s radiographs and have reviewed the reports:  cxr portable: 11-05-17: IMPRESSION:  ?? no overt decompensated chf - mild increased markings  On portable film.           Lendon Ka, MD

## 2017-11-09 NOTE — Progress Notes (Signed)
OT Note  Attempted to see Pt for OT. Pt back on HFNC. Session aborted as Pt verbalizing she does not want therapy at this time. Pt verbalized, "I don't know what I want" when asked if she is not feeling up to it today or if she does not wish to participate in therapy moving forward. Will return tomorrow to clarify with Pt and MD.     Thank you,    Benay PillowWhitney Cherry, OTR/L

## 2017-11-09 NOTE — Progress Notes (Addendum)
0715- Bedside and Verbal shift change report given to Tawanna Coolerhristen RN (oncoming nurse) by Gwendolyn Limauzen RN (offgoing nurse). Report included the following information SBAR, Kardex, Intake/Output, Recent Results, Cardiac Rhythm NSR and Alarm Parameters .    [id number]- Shift assessment completed; see flow sheet for details. Pt A/V/Ox4; able to follow commands without difficulty; speech is clear. Currently in NSR; BP stable. Lungs are clear; diminished in the bases; currently on HiFlow at 80%; O2 sats are WNL; pt has been alternating between BiPAP and HiFlow to maintain O2 status. ABD is soft; BS active; pt continues with nausea at times and poor PO appetite. Purewick in place; minimal UOP; PO fluids encouraged and accepted. Skin is warm and dry; scattered bruising to ABD. Pt denies pain/discomfort at this time.     1023- Pt continues to rest in recliner chair with family at the bedside. Request not to be stuck for labs at this time; will try again later    1225- Reassessment completed; see flow sheet for details. No acute changes in pt assessment; pt remains in recliner chair; family at the bedside. Poorly tolerating PO medications; begins dry heaving before medications are even given; dry heaving leads to vomiting     1352- Pt bathed and place back into bed; continues with increased SOB on exertion; resolves with rest; placed on BiPAP once back into the bed to assist with improving oxygenation status; will monitor at this time.      1605- Reassessment completed; see flow sheet for details. No acute changes in pt assessment; continues to use BiPAP and Hiflow interchangeably. No complaints voiced regarding pain/discomfort .    1745- Pt refusing both insulin and PO medications at this time. Pt states "I dont want to take nothing right now"; education provided; pt nods with understanding.      1910- Bedside and Verbal shift change report given to Dole Fooduzen RN (Cabin crewoncoming nurse) by Tawanna Coolerhristen RN (offgoing nurse). Report included the following  information SBAR, Kardex, Intake/Output, Recent Results, Cardiac Rhythm NSR and Alarm Parameters .

## 2017-11-10 ENCOUNTER — Inpatient Hospital Stay: Admit: 2017-11-10 | Payer: MEDICARE | Primary: Internal Medicine

## 2017-11-10 LAB — DIFFERENTIAL, AUTO.
ABS. BASOPHILS: 0 10*3/uL (ref 0.0–0.1)
ABS. EOSINOPHILS: 0 10*3/uL (ref 0.0–0.4)
ABS. IMM. GRANS.: 0.3 10*3/uL — ABNORMAL HIGH (ref 0.00–0.04)
ABS. LYMPHOCYTES: 0.8 10*3/uL (ref 0.8–3.5)
ABS. MONOCYTES: 1.9 10*3/uL — ABNORMAL HIGH (ref 0.0–1.0)
ABS. NEUTROPHILS: 12.2 10*3/uL — ABNORMAL HIGH (ref 1.8–8.0)
BASOPHILS: 0 % (ref 0–1)
EOSINOPHILS: 0 % (ref 0–7)
IMMATURE GRANULOCYTES: 2 % — ABNORMAL HIGH (ref 0.0–0.5)
LYMPHOCYTES: 5 % — ABNORMAL LOW (ref 12–49)
MONOCYTES: 13 % (ref 5–13)
NEUTROPHILS: 80 % — ABNORMAL HIGH (ref 32–75)

## 2017-11-10 LAB — METABOLIC PANEL, COMPREHENSIVE
A-G Ratio: 0.9 — ABNORMAL LOW (ref 1.1–2.2)
ALT (SGPT): 890 U/L — ABNORMAL HIGH (ref 12–78)
AST (SGOT): 510 U/L — ABNORMAL HIGH (ref 15–37)
Albumin: 3.5 g/dL (ref 3.5–5.0)
Alk. phosphatase: 138 U/L — ABNORMAL HIGH (ref 45–117)
Anion gap: 14 mmol/L (ref 5–15)
BUN/Creatinine ratio: 34 — ABNORMAL HIGH (ref 12–20)
BUN: 129 MG/DL — ABNORMAL HIGH (ref 6–20)
Bilirubin, total: 2.9 MG/DL — ABNORMAL HIGH (ref 0.2–1.0)
CO2: 19 mmol/L — ABNORMAL LOW (ref 21–32)
Calcium: 8.1 MG/DL — ABNORMAL LOW (ref 8.5–10.1)
Chloride: 98 mmol/L (ref 97–108)
Creatinine: 3.84 MG/DL — ABNORMAL HIGH (ref 0.55–1.02)
GFR est AA: 14 mL/min/{1.73_m2} — ABNORMAL LOW (ref >60)
GFR est non-AA: 12 mL/min/{1.73_m2} — ABNORMAL LOW (ref >60)
Globulin: 3.7 g/dL (ref 2.0–4.0)
Glucose: 144 mg/dL — ABNORMAL HIGH (ref 65–100)
Potassium: 5.7 mmol/L — ABNORMAL HIGH (ref 3.5–5.1)
Protein, total: 7.2 g/dL (ref 6.4–8.2)
Sodium: 131 mmol/L — ABNORMAL LOW (ref 136–145)

## 2017-11-10 LAB — GLUCOSE, POC: Glucose (POC): 196 mg/dL — ABNORMAL HIGH (ref 65–100)

## 2017-11-10 LAB — CBC W/O DIFF
ABSOLUTE NRBC: 0.47 10*3/uL — ABNORMAL HIGH (ref 0.00–0.01)
HCT: 41.3 % (ref 35.0–47.0)
HGB: 13.3 g/dL (ref 11.5–16.0)
MCH: 27.5 PG (ref 26.0–34.0)
MCHC: 32.2 g/dL (ref 30.0–36.5)
MCV: 85.5 FL (ref 80.0–99.0)
MPV: ABNORMAL FL (ref 8.9–12.9)
NRBC: 3.1 PER 100 WBC — ABNORMAL HIGH
PLATELET: 123 10*3/uL — ABNORMAL LOW (ref 150–400)
RBC: 4.83 M/uL (ref 3.80–5.20)
RDW: 20.5 % — ABNORMAL HIGH (ref 11.5–14.5)
WBC: 15.1 10*3/uL — ABNORMAL HIGH (ref 3.6–11.0)

## 2017-11-10 LAB — PHOSPHORUS: Phosphorus: 8.5 MG/DL — ABNORMAL HIGH (ref 2.6–4.7)

## 2017-11-10 LAB — MAGNESIUM: Magnesium: 2.9 mg/dL — ABNORMAL HIGH (ref 1.6–2.4)

## 2017-11-10 MED ORDER — GLYCOPYRROLATE 0.2 MG/ML IJ SOLN
0.2 mg/mL | INTRAMUSCULAR | Status: DC | PRN
Start: 2017-11-10 — End: 2017-11-10

## 2017-11-10 MED ORDER — MORPHINE 2 MG/ML INJECTION
2 mg/mL | INTRAMUSCULAR | Status: DC | PRN
Start: 2017-11-10 — End: 2017-11-10
  Administered 2017-11-10 (×2): via INTRAVENOUS

## 2017-11-10 MED ORDER — ONDANSETRON (PF) 4 MG/2 ML INJECTION
4 mg/2 mL | INTRAMUSCULAR | Status: DC | PRN
Start: 2017-11-10 — End: 2017-11-10

## 2017-11-10 MED ORDER — MORPHINE 4 MG/ML SYRINGE
4 mg/mL | INTRAMUSCULAR | Status: DC
Start: 2017-11-10 — End: 2017-11-10

## 2017-11-10 MED ORDER — MORPHINE 4 MG/ML SYRINGE
4 mg/mL | INTRAMUSCULAR | Status: AC
Start: 2017-11-10 — End: 2017-11-10
  Administered 2017-11-10: 20:00:00

## 2017-11-10 MED ORDER — MORPHINE 4 MG/ML INTRAVENOUS SOLUTION
4 mg/mL | INTRAVENOUS | Status: AC
Start: 2017-11-10 — End: 2017-11-10
  Administered 2017-11-10: 21:00:00 via RESPIRATORY_TRACT

## 2017-11-10 MED ORDER — ALBUMIN, HUMAN 25 % IV
25 % | Freq: Once | INTRAVENOUS | Status: AC
Start: 2017-11-10 — End: 2017-11-10
  Administered 2017-11-10: 15:00:00 via INTRAVENOUS

## 2017-11-10 MED ORDER — LORAZEPAM 2 MG/ML IJ SOLN
2 mg/mL | INTRAMUSCULAR | Status: DC | PRN
Start: 2017-11-10 — End: 2017-11-10

## 2017-11-10 MED ORDER — PROCHLORPERAZINE EDISYLATE 5 MG/ML INJECTION
5 mg/mL | INTRAMUSCULAR | Status: DC | PRN
Start: 2017-11-10 — End: 2017-11-10

## 2017-11-10 MED FILL — IPRATROPIUM-ALBUTEROL 2.5 MG-0.5 MG/3 ML NEB SOLUTION: 2.5 mg-0.5 mg/3 ml | RESPIRATORY_TRACT | Qty: 3

## 2017-11-10 MED FILL — ALBUTEIN 25 % INTRAVENOUS SOLUTION: 25 % | INTRAVENOUS | Qty: 200

## 2017-11-10 MED FILL — MORPHINE 4 MG/ML SYRINGE: 4 mg/mL | INTRAMUSCULAR | Qty: 1

## 2017-11-10 MED FILL — HEPARIN (PORCINE) 5,000 UNIT/ML IJ SOLN: 5000 unit/mL | INTRAMUSCULAR | Qty: 1

## 2017-11-10 MED FILL — BUMETANIDE 0.25 MG/ML IJ SOLN: 0.25 mg/mL | INTRAMUSCULAR | Qty: 10

## 2017-11-10 MED FILL — HEALTHYLAX 17 GRAM ORAL POWDER PACKET: 17 gram | ORAL | Qty: 1

## 2017-11-10 MED FILL — VELTASSA 8.4 GRAM ORAL POWDER PACKET: 8.4 gram | ORAL | Qty: 1

## 2017-11-10 MED FILL — DOK PLUS 8.6 MG-50 MG TABLET: ORAL | Qty: 2

## 2017-11-10 MED FILL — CALCIUM ACETATE 667 MG CAP: 667 mg | ORAL | Qty: 1

## 2017-11-10 MED FILL — INSULIN LISPRO 100 UNIT/ML INJECTION: 100 unit/mL | SUBCUTANEOUS | Qty: 1

## 2017-11-10 MED FILL — ASPIRIN 81 MG TAB, DELAYED RELEASE: 81 mg | ORAL | Qty: 1

## 2017-11-10 MED FILL — BD POSIFLUSH NORMAL SALINE 0.9 % INJECTION SYRINGE: INTRAMUSCULAR | Qty: 20

## 2017-11-10 MED FILL — REVATIO 20 MG TABLET: 20 mg | ORAL | Qty: 1

## 2017-11-10 MED FILL — BD POSIFLUSH NORMAL SALINE 0.9 % INJECTION SYRINGE: INTRAMUSCULAR | Qty: 30

## 2017-11-10 MED FILL — MORPHINE 4 MG/ML INTRAVENOUS SOLUTION: 4 mg/mL | INTRAVENOUS | Qty: 1

## 2017-11-10 MED FILL — PREDNISONE 5 MG TAB: 5 mg | ORAL | Qty: 2

## 2017-11-10 NOTE — Progress Notes (Signed)
Dr. Reece AgarG made aware and new orders recd.

## 2017-11-10 NOTE — Other (Signed)
0800  Pat resting soundly.  Will allow her to sleep at this time.    0910 Attempts to wake pat up and administer med.  Pat declining meds at this time and finger stick.    1018 Pat placed on high flow.  Pat taking sips of drink and am meds.  Pat made aware that SBP cycling low.  Discussed w/ pat that she has an IV med available to keep her BP up.  Pat declining at this time.  Pat agrees to consider medication if SBP low on next set of VS.  Family at bedside, support provided.   1113 Dr. Reece AgarG ordered albumin for consistent low BP.   Pat agrees to take albumin despite chart stating no blood products.  1126 Pat aware that she is declining rapidly today.  Dennie Bibleat states she is comfortable.  Told Ms. B I will call her brother.  She said, "ok."  1130 Spoke w/ pat brother, Mr. Katrinka BlazingSmith.  Brother updated w/ pat status.   1215 pat cont to take sips when offered.  Dennie Bibleat says no when asked if she is uncomfortable.  Support provided.   1236 several attempts to get BP, unable to get get recent BP reading.  Positions changed, cuffs changed, unsuccessful.   1325 Pat cont to take sips of liqued when offered.  Pat sister in law at bedside.  Dennie Bibleat denies pain, but says yes when asked   about needing something to make her comfortable.   1421 pat more responds yes when asked if morphine helped her.  Ms. B unable to take sips anymore.  Sponges offered and pat currently able to suck liquid.  Pat brother and sister and law at bedside holding pat hand.    1447 Pat declined bipap.  Family remains at bedside.   1644 2 mg Morphine given SIVP, pat air hungry.  Support provided to pat and family.  1650 4 mg Morphine given via neb by RT.  Pat family member requesting BP on pat.  Explained to pat family member that unable to obtain BP x several hours.    1715 Asystole on monitor. Family at bedside.  Support provided.  [employer] called.

## 2017-11-10 NOTE — Progress Notes (Addendum)
Chart reviewed. Spoke to RN who reports that pt is not appropriate for participation with therapy secondary to respiratory status and hypotension (BP 50s/30s while at supine rest). Secondary to tenuous respiratory status, pt has required either BiPAP or Hi Flow O2 since admission and has been unable to wean off HFNC/BiPAP x3 weeks or demonstrate any progression with skilled intervention. Noted Palliative Care consult to discuss POC (hospice?). Pt only able to tolerate bed<>chair<>bedside commode transfers as a result, which pt has been completing with RN staff as able (mobilizes w/ RW and SBA). Pt has either declined participation with therapy or been inappropriate for active participation secondary to respiratory status x1 week. Will complete PT orders at this time as pt has been inappropriate for participation with therapy x1week however please re-consult based on patient/family wishes and pending further medical stability. Thank you    Demetrio LappingMargaret M Bryan, PT, DPT

## 2017-11-10 NOTE — Discharge Summary (Signed)
Hospitalist Discharge Summary     Patient ID:  Brittney Shelton  [id number]  67 y.o.  Oct 16, 1950    PCP on record: Erenest Rasheromah, Nau D, MD    Admit date: 10/30/2017  Discharge date and time: 10/08/2017      DISCHARGE DIAGNOSIS:    Pt expired during hospitalization  Acute on chronic respiratory failure with hypoxia POA (baseline 4LNC)  Acute on chronic diastolic HF POA  Cor pulmonale POA due to severe pulmonary hypertension   Right lower lobe pneumonia  COPD exacerbation POA  Acute kidney injury POA  Hyperkalemia  Essential HTN POA  DM type 2 POA  Nausea/vomiting  Constipation resolved   Elevated LFTs: r/o biliary colic   Thrombocytopenia  Hyperkalemia  Remote tobacco use quit 20 years ago  Morbid obesity       CONSULTATIONS:  IP CONSULT TO INTENSIVIST  IP CONSULT TO GASTROENTEROLOGY  IP CONSULT TO NEPHROLOGY  IP CONSULT TO PALLIATIVE CARE - PROVIDER  IP CONSULT TO CARDIOLOGY    Excerpted HPI from H&P of Tawny AsalJr William P Ciesla, MD:  67 year old African-American female  Currently lives with her brother, she divorced, no children  Baseline uses 4 L nasal cannula oxygen due to underlying COPD and CHF    Past week been "feeling bad", could not keep her oxygen sats up on her baseline oxygen  More short of breath when she exerts herself,  now even short of breath at rest  Walking 10 to 15 feet to the bathroom she has to stop and rest to catch her breath  More short of breath laying flat but was not clear that she feels better when she sits up  Intermittent ankle edema "comes and goes"  No weight gain  Episode of nausea vomiting approximately 1 week ago while she was in bed       no coughing or choking at the time  Since that time she has had increasing dry cough over the past week  No chest pain or abdominal pain  No fevers, chills, sore throat, headache, diarrhea  Continued to feel poorly due to the shortness of breath, called her PCP for an appointment today     Went to PCPs office, sats 75% on 4 L nasal cannula, looked acutely short of breath  Sent to the emergency room    Emergency room  required 100% nonrebreather  When I saw her she was resting comfortably, denies shortness of breath          even though her sats were approximately 90% on the 100% oxygen  WBC 8.4  BUN/creatinine 55 and 1.61, prior baseline creatinine was 0.8 and 1.1  proBNP was 16,382  Chest x-ray had but right basilar airspace disease and right effusion  CT of the chest with no PE there is a moderate effusion with some compression versus airspace disease  ABG on 100% oxygen pH 7.28, PCO2 47, PO2 83  Received Lasix 40 mg  IV Solu-Medrol, multiple nebs  Was not wheezing when I saw her  We were called to admit the patient            ??        Death Summary      Patient was admitted to hospital and diagnosed to have acute on chronic kidney failure with hypoxia secondary to acute on chronic diastolic congestive heart failure along with cor pulmonale to severe pulmonary hypertension. ??She was also noted to have right lower lobe pneumonia along with COPD exacerbation. ??  Pulmonary has seen the patient and felt that she has tension. ??She was treated with antibiotics IV steroids. ??She was also noted to have acute kidney injury along with hyperkalemia for which renal was consulted and renal felt she is not a dialysis candidate. ??She continued to deteriorate in spite of maximal treatment plan pulmonary recommended transitioning to hospice and a consultation was placed with palliative care team and family agreed for hospice shortly after talking to hospice nurse patient was pronounced deceased on 11/17/17 at 1706.    Cause of death    Cor pulmonale  Severe pulmonary hypertension  Diastolic congestive heart failure  COPD exacerbation      Time spent : 35 minutes    Signed:  Vena Austria, MD

## 2017-11-10 NOTE — Progress Notes (Signed)
Death Note    Physical Exam:  No corneal reflex.  No gag reflex.  No heart sounds on auscultation.  No spontaneous breath sounds.  No withdrawal from painful stimuli.    Time of Death [id number].    May her kind soul rest in peace.

## 2017-11-10 NOTE — Progress Notes (Signed)
TOC PLAN:  Hospice - IP vs home dependent on level of family support and respiratory status.    Pt with acute respiratory failure due to pulmonary hypertension with hx of COPD. Pt has been alternating between BIPAP and hi flow 02 - more BIPAP over weekend. Sats drop quickly with slightest exertion - not able to participate in therapy.  AKI - Nephrology following - not considered HD candidate.    Intensivists have been discussing options and outcomes with pt - last week pt decided on full DNR - Will Need DDNR form.    Palliative Care discussion occurred today with pt and family involved. Hospice Consult received after meeting. [facility] be [facility] only for [facility] - [place] aware. Hi flow 02 is being attempted again.    CM will remain avbl to assist w/ dc plans as needed.    Brittney Shelton, MSW  [phone]

## 2017-11-10 NOTE — Progress Notes (Signed)
[facility] Progress Note        NAME: Brittney Shelton       DOB:  1951-04-28       MRN:  [id number]     Date/Time: 11/07/2017    Risk of deterioration: high       Assessment:    Plan:  AKI with hyperkalemia  Acute/chronic resp failure  Pulm HTN/dil RA/RV  OBesity  DM  PNA  Thrombocytopenia  Transaminitis -? Hepatic congestion AKI multifactorial-seems to be endstage from her pulm htn/distolic heart failure and is currently a dnr.    Pt has had microalbuminuria for years which is representative of underlying diabetic nephropathy    I would not recommend HD for her as it would not reverse her underlying pulm htn/heart failure    Pred contributing to her elevated bun.    CXR not indicative of pulm edema, earlier cta (-) for pe, edema. Difficult to judge volume status from exam alone    Very tenuous status.    Creatinine continues to worsen.  Potassium elevated and the patient refuses treatment for that.    Prognosis very poor.    Seems like hospice would be reasonable.  Palliative care to see.           Subjective:     Chief Complaint:  Not as alert.  Appears sedated.    Review of Systems: UTO    Objective:     VITALS:   Last 24hrs VS reviewed since prior progress note. Most recent are:  Visit Vitals  BP (!) 70/49   Pulse 87   Temp 97.6 ??F (36.4 ??C)   Resp 13   Ht 5\' 6"  (1.676 m)   Wt 135.2 kg (298 lb 1 oz)   SpO2 (!) 88%   BMI 48.11 kg/m??     SpO2 Readings from Last 6 Encounters:   11/12/2017 (!) 88%   10/03/2017 (!) 55%   09/11/17 91%   07/30/17 90%   06/10/17 92%   06/04/17 93%    O2 Flow Rate (L/min): 55 l/min       Intake/Output Summary (Last 24 hours) at 11/20/2017 1032  Last data filed at 11/09/2017 1726  Gross per 24 hour   Intake 240 ml   Output 50 ml   Net 190 ml        Telemetry Reviewed       PHYSICAL EXAM:    General   On resp support.  Rhonchi bilaterally  +edema                Lab Data Reviewed: (see below)    Medications Reviewed: (see below)    PMH/SH reviewed - no change compared to H&P   ___________________________________________________    ___________________________________________________    Attending Physician: Ashley Royalty, MD     ____________________________________________________  MEDICATIONS:  Current Facility-Administered Medications   Medication Dose Route Frequency   ??? sildenafil (antihypertensive) (REVATIO) tablet 20 mg  20 mg Oral TID   ??? morphine injection 1 mg  1 mg IntraVENous Q3H PRN   ??? prochlorperazine (COMPAZINE) with saline injection 10 mg  10 mg IntraVENous Q6H PRN   ??? morphine injection 4 mg  4 mg Inhalation Q4H RT   ??? albuterol-ipratropium (DUO-NEB) 2.5 MG-0.5 MG/3 ML  3 mL Nebulization Q6H RT   ??? bumetanide (BUMEX) injection 2 mg  2 mg IntraVENous Q12H   ??? patiromer calcium sorbitex (VELTASSA) powder 8.4 g  8.4 g Oral DAILY   ???  calcium acetate (PHOSLO) capsule 667 mg  1 Cap Oral TID WITH MEALS   ??? heparin (porcine) injection 5,000 Units  5,000 Units SubCUTAneous Q12H   ??? nystatin (MYCOSTATIN) 100,000 unit/gram powder   Topical BID   ??? predniSONE (DELTASONE) tablet 10 mg  10 mg Oral DAILY WITH BREAKFAST   ??? senna-docusate (PERICOLACE) 8.6-50 mg per tablet 2 Tab  2 Tab Oral BID   ??? artificial tears (dextran 70-hypromellose) (NATURAL BALANCE) 0.1-0.3 % ophthalmic solution 1 Drop  1 Drop Both Eyes PRN   ??? polyethylene glycol (MIRALAX) packet 17 g  17 g Oral DAILY   ??? dextrose 10% infusion 125-250 mL  125-250 mL IntraVENous PRN   ??? insulin lispro (HUMALOG) injection   SubCUTAneous AC&HS   ??? sodium chloride (NS) flush 5-40 mL  5-40 mL IntraVENous Q8H   ??? sodium chloride (NS) flush 5-40 mL  5-40 mL IntraVENous PRN   ??? acetaminophen (TYLENOL) tablet 650 mg  650 mg Oral Q6H PRN   ??? naloxone (NARCAN) injection 0.4 mg  0.4 mg IntraVENous PRN   ??? ondansetron (ZOFRAN) injection 4 mg  4 mg IntraVENous Q4H PRN   ??? bisacodyl (DULCOLAX) suppository 10 mg  10 mg Rectal DAILY PRN   ??? aspirin delayed-release tablet 81 mg  81 mg Oral DAILY    ??? glucose chewable tablet 16 g  4 Tab Oral PRN   ??? glucagon (GLUCAGEN) injection 1 mg  1 mg IntraMUSCular PRN        LABS:  Recent Labs     09/16/2017  0428   WBC 15.1*   HGB 13.3   HCT 41.3   PLT 123*     Recent Labs     09/16/2017  0428   NA 131*   K 5.7*   CL 98   CO2 19*   BUN 129*   CREA 3.84*   GLU 144*   CA 8.1*   MG 2.9*   PHOS 8.5*     Recent Labs     09/16/2017  0428   SGOT 510*   ALT 890*   AP 138*   TBILI 2.9*   TP 7.2   ALB 3.5   GLOB 3.7     No results for input(s): INR, PTP, APTT in the last 72 hours.    No lab exists for component: INREXT, INREXT   No results for input(s): FE, TIBC, PSAT, FERR in the last 72 hours.   No results for input(s): PH, PCO2, PO2 in the last 72 hours.  No results for input(s): CPK, CKNDX, TROIQ in the last 72 hours.    No lab exists for component: CPKMB  Lab Results   Component Value Date/Time    Glucose (POC) 196 (H) 11/09/2017 09:56 PM    Glucose (POC) 213 (H) 11/09/2017 04:35 PM    Glucose (POC) 159 (H) 11/09/2017 01:09 PM    Glucose (POC) 144 (H) 11/09/2017 08:17 AM    Glucose (POC) 136 (H) 11/08/2017 10:21 PM

## 2017-11-10 NOTE — Progress Notes (Signed)
Palliative Medicine  [phone]    Per patient, meeting has been set up with her niece and brother to discuss goals of care moving forward  Meeting set up for 3pm today    Karleen Dolphiniffany G Dail Lerew, NP

## 2017-11-10 NOTE — Consults (Signed)
Palliative Medicine Consult  [place]: [phone])  Patient Name: Brittney Shelton  Date of Birth: Sep 16, 1950    Date of Initial Consult: 2017-11-25  Reason for Consult: End Stage Disease  Requesting Provider: Eulogio Ditch, MD  Primary Care Physician: Ermalene Postin, MD     SUMMARY:   Brittney Shelton is a 67 y.o. with a past history of COPD, CHF, DM, and HTN, who was admitted on 10/26/2017 from home with a diagnosis of acute on chronic respiratory failure with hypoxia, along with acute on chronic diastolic CHF.  On presentation to the hospital she was noted to have worsening DOE and oxygen requirement for the past week. She was sent to the ED from her PCP's office for low sats on 4LPM and active shortness of breath.  During this hospital stay she was noted to have pulmonary hypertension, worsening heart failure, and now with worsening kidney disease, all appearing end stage.    Current medical issues leading to Palliative Medicine involvement include: goals of care discussion in this patient with progressively worsening chronic diseases and high symptom burden    Social:  She is divorced with no children- she lives with her brother Jaquelyn Bitter, and has another Nurse, adult with whom she is close,  Her niece (ernests daughter) Larene Beach is also close to her, and is her primary mPOA     PALLIATIVE DIAGNOSES:   1. Shortness of breath  2. Frailty  3. Debility  4. Goals of Care discussion  5. Comfort Care       PLAN:   1. Initially met with patient alone earlier in the day- she was minimally responsive but able to answer simple questions.  She asked me to contact her 2 brothers and her niece to talk about decision making and prognosis  2. Later in the day we Jacelyn Grip, LCSW and myself) met with both her brothers, Jaquelyn Bitter and Meda Coffee, along with her niece Larene Beach, Shannon's mom Edd Arbour, and Nelsons wife Estill Bamberg  3. The family asked for an update as to her medical condition, as Estill Bamberg  and Jaquelyn Bitter were up to speed but no one else was.  We talked through the cascade of organs that were currently being effected, and how her prognosis was grim.  Estill Bamberg shared that she had heard the same thing back on Thursday when she was here, and this was when Mrs Chiquito elected to be a DNR, and shared with her that she "just does not want to be in pain"  4. Through our conversation, although Mrs Weimann never put her wishes down on paper, it sounds like between Thursday and yesterday- she has said goodbye to those who were close to her  5. By the end of the conversation, the family (taking Shannon's lead) was in agreement with transitioning to comfort and getting hospice involved- I anticipate she will pass in the next few days, and at the moment meets inpatient criteria due to symptom burden, but we prepared the family that she may linger, or pass in a few hours  6. Initiated comfort care orders- she is breathing very fast and labored, so increased IV morphine to every 15 min along with Ativan to help.  Plan to wean high flow with the help of comfort meds  7. Nebulized morphine likely not helping, but left in the meantime until symptoms are under control   8. Hoping for transfer to oncology to allow for a more therapeutic environment  9. Please d/c all cardiac monitoring to allow  for more comfort with less medical devices  10. Initial consult note routed to primary continuity provider and/or primary health care team members  53. Communicated plan of care with: Palliative IDT, [place] Team     GOALS OF CARE / TREATMENT PREFERENCES:     GOALS OF CARE:  Patient/Health Care Proxy Stated Goals: Comfort    TREATMENT PREFERENCES:   Code Status: DNR    Advance Care Planning:  '[x]'  The Pall Med Interdisciplinary Team has updated the ACP Navigator with Health Care Decision Maker and Patient Capacity      Primary Decision Maker: Bronwen Betters - Unknown [phone]     Secondary Decision Maker: Smith,Ernest - Brother - [phone]    Secondary Decision Maker: Harlon Ditty - Unknown [phone]    Medical Interventions: Comfort measures     Other Instructions:   Artificially Administered Nutrition: No feeding tube     Other:    As far as possible, the palliative care team has discussed with patient / health care proxy about goals of care / treatment preferences for patient.     HISTORY:     History obtained from: chart, family    CHIEF COMPLAINT: none from patient, she was minimally conversational with me during the first visit, and unarousable for the second visit    HPI/SUBJECTIVE:    The patient is:   '[]'  Verbal and participatory  '[x]'  Non-participatory due to:     Labored breathing on highflow  Appears very fatigued  Appears uncomfortable    Clinical Pain Assessment (nonverbal scale for severity on nonverbal patients):              Duration: for how long has pt been experiencing pain (e.g., 2 days, 1 month, years)  Frequency: how often pain is an issue (e.g., several times per day, once every few days, constant)     FUNCTIONAL ASSESSMENT:     Palliative Performance Scale (PPS):  PPS: 20       PSYCHOSOCIAL/SPIRITUAL SCREENING:     Palliative IDT has assessed this patient for cultural preferences / practices and a referral made as appropriate to needs Orthoptist, Patient Advocacy, Ethics, etc.)    Any spiritual / religious concerns:  '[]'  Yes /  '[x]'  No    Caregiver Burnout:  '[]'  Yes /  '[x]'  No /  '[]'  No Caregiver Present      Anticipatory grief assessment:   '[x]'  Normal  / '[]'  Maladaptive       ESAS Anxiety: Anxiety: 0    ESAS Depression:          REVIEW OF SYSTEMS:     Positive and pertinent negative findings in ROS are noted above in HPI.  The following systems were '[x]'  reviewed / '[]'  unable to be reviewed as noted in HPI  Other findings are noted below.  Systems: constitutional, ears/nose/mouth/throat, respiratory,  gastrointestinal, genitourinary, musculoskeletal, integumentary, neurologic, psychiatric, endocrine. Positive findings noted below.  Modified ESAS Completed by: provider   Fatigue: 10           Anxiety: 0 Nausea: 5   Anorexia: 10 Dyspnea: 10           Stool Occurrence(s): 0        PHYSICAL EXAM:     From RN flowsheet:  Wt Readings from Last 3 Encounters:   December 04, 2017 298 lb 1 oz (135.2 kg)   09/11/17 262 lb 11.2 oz (119.2 kg)   07/30/17 261 lb (118.4 kg)  Blood pressure (!) 57/36, pulse 78, temperature 97.6 ??F (36.4 ??C), resp. rate 18, height '5\' 6"'  (1.676 m), weight 298 lb 1 oz (135.2 kg), SpO2 (!) 85 %.    Pain Scale 1: Numeric (0 - 10)  Pain Intensity 1: 0  Pain Onset 1: chronic  Pain Location 1: Knee  Pain Orientation 1: Left, Right  Pain Description 1: Aching  Pain Intervention(s) 1: Medication (see MAR)  Last bowel movement, if known:     Constitutional: sleeping/fatigued, ill appearing  Eyes: pupils equal, anicteric  ENMT: no nasal discharge, moist mucous membranes  Respiratory: breathing labored, on highflow  Gastrointestinal: soft non-tender, +bowel sounds  Musculoskeletal: no deformity, no tenderness to palpation  Skin: warm, dry  Neurologic: not following commands  Psychiatric: unable to assess  Other:       HISTORY:     Active Problems:    COPD (chronic obstructive pulmonary disease) (HCC) (07/26/2015)      Acute respiratory failure with hypoxemia ([place]) (09/30/2017)      Cor pulmonale ([place]) (10/22/2017)      Pulmonary hypertension ([place]) (10/22/2017)      Past Medical History:   Diagnosis Date   ??? Chronic obstructive pulmonary disease ([place])    ??? Congestive heart failure ([place])    ??? Diabetes ([place])    ??? Hypertension       History reviewed. No pertinent surgical history.   Family History   Problem Relation Age of Onset   ??? Diabetes Mother    ??? Arthritis-osteo Father    ??? COPD Sister    ??? Diabetes Brother    ??? Diabetes Brother       History reviewed, no pertinent family history.  Social History     Tobacco Use    ??? Smoking status: Never Smoker   ??? Smokeless tobacco: Never Used   Substance Use Topics   ??? Alcohol use: No     Alcohol/week: 0.0 oz     Allergies   Allergen Reactions   ??? Lisinopril Angioedema      Current Facility-Administered Medications   Medication Dose Route Frequency   ??? morphine 4 mg/mL injection       ??? ondansetron (ZOFRAN) injection 4 mg  4 mg IntraVENous Q4H PRN   ??? prochlorperazine (COMPAZINE) injection 10 mg  10 mg IntraVENous Q4H PRN   ??? LORazepam (ATIVAN) injection 1 mg  1 mg IntraVENous Q15MIN PRN   ??? morphine injection 2 mg  2 mg IntraVENous Q15MIN PRN   ??? glycopyrrolate (ROBINUL) injection 0.2 mg  0.2 mg IntraVENous Q4H PRN   ??? nystatin (MYCOSTATIN) 100,000 unit/gram powder   Topical BID   ??? artificial tears (dextran 70-hypromellose) (NATURAL BALANCE) 0.1-0.3 % ophthalmic solution 1 Drop  1 Drop Both Eyes PRN   ??? sodium chloride (NS) flush 5-40 mL  5-40 mL IntraVENous Q8H   ??? sodium chloride (NS) flush 5-40 mL  5-40 mL IntraVENous PRN          LAB AND IMAGING FINDINGS:     Lab Results   Component Value Date/Time    WBC 15.1 (H) 12/03/17 04:28 AM    HGB 13.3 12/03/2017 04:28 AM    PLATELET 123 (L) Dec 03, 2017 04:28 AM     Lab Results   Component Value Date/Time    Sodium 131 (L) 2017-12-03 04:28 AM    Potassium 5.7 (H) 12-03-17 04:28 AM    Chloride 98 2017-12-03 04:28 AM    CO2 19 (L) December 03, 2017 04:28 AM  BUN 129 (H) 2017/12/05 04:28 AM    Creatinine 3.84 (H) 05-Dec-2017 04:28 AM    Calcium 8.1 (L) 05-Dec-2017 04:28 AM    Magnesium 2.9 (H) 2017-12-05 04:28 AM    Phosphorus 8.5 (H) 2017/12/05 04:28 AM      Lab Results   Component Value Date/Time    AST (SGOT) 510 (H) 2017/12/05 04:28 AM    Alk. phosphatase 138 (H) 12-05-17 04:28 AM    Protein, total 7.2 12-05-17 04:28 AM    Albumin 3.5 2017-12-05 04:28 AM    Globulin 3.7 2017-12-05 04:28 AM     Lab Results   Component Value Date/Time    INR 1.1 03/11/2017 08:00 PM    Prothrombin time 11.1 03/11/2017 08:00 PM    aPTT 29.4 03/11/2017 08:00 PM       No results found for: IRON, FE, TIBC, IBCT, PSAT, FERR   Lab Results   Component Value Date/Time    pH 7.48 (H) 03/11/2017 08:30 PM    PCO2 46 (H) 03/11/2017 08:30 PM    PO2 46 (LL) 03/11/2017 08:30 PM     No components found for: [facility]   Lab Results   Component Value Date/Time    CK 129 03/11/2017 08:00 PM    CK - MB <1.0 03/11/2017 08:00 PM                Total time:   Counseling / coordination time, spent as noted above:   > 50% counseling / coordination?:     Prolonged service was provided for  '[]' 30 min   '[]' 75 min in face to face time in the presence of the patient, spent as noted above.  Time Start:   Time End:   Note: this can only be billed with [phone] (initial) or [phone] (follow up).  If multiple start / stop times, list each separately.

## 2017-11-10 NOTE — Progress Notes (Signed)
Chart reviewed. Per RN, Pt is not appropriate for participation in therapy 2/2 to respiratory status and chronic hypotension. Since admission Pt has required either BiPAP or HFNC and has been unable to wean off x3 weeks. Pt has either declined participation with OT or been inappropriate for active participation 2/2 to respiratory status x1 week at this point, and has not demonstrated any skilled progression with OT intervention. Noted Palliative Care consult to discuss POC (possibly hospice?). Pt only able to tolerate seated ADLs at this time and bed<>chair<>BSC transfers, which Pt has been completing with RN staff as able using RW.    Will complete OT orders at this time as Pt has been inappropriate for participation with therapy x1 week. Please re-consult based on patient/family wishes and pending further medical stability.    Thank you,    Benay PillowWhitney Cherry, OTR/L

## 2017-11-10 NOTE — Progress Notes (Signed)
Received a phone call from [place] with [employer] requesting that we hold for [facility] and [facility] until further notice.

## 2017-11-10 NOTE — Hospice (Addendum)
[facility] RN note:  Consult noted.  Reviewed chart.  Discussed pt with palliative team and staff RN before meeting with pt and family.  Pt with resp distress despite IV and nebulized morphine.and high flow 02 at 15L/min.      Discussed hospice philosophy and services as they relate to inpt hospice.  All in [facility].  [facility].      Pt rapidly declining, HR 22, no BP or 02 sat measurable.  Pt taking last breaths, family gathered at bedside.      TOD 5:06--Dr Golla in to pronounce.    17:25---family grieving intensely, calling family members, deciding on funeral arrangements.  Tana CoastChaplain Vic in route to the hospital to support family.  Family gathering in conference room outside pt room.

## 2017-11-10 NOTE — Progress Notes (Addendum)
Refused to get stuck.    No blood return from midline.    Colleague volunteered to draw blood if able. Patient agreed.     Patient slept all night last night with no problem noted.    Unable to obtain an accurate BP due to a lot of adipose tissues to all four limbs.

## 2017-11-10 NOTE — Progress Notes (Signed)
[facility]  Name: Brittney Shelton MRN: [id number]   DOB: 1950/08/28 [facility]   Date: 11/01/2017        IMPRESSION:   ?? Acute/chronic hypoxic/hypercapnic respiratory failure; on 4 LPm at home for many yrs since [place];  Cxr does NOT  Show increased edema so fear this  Is her pulm HTN  ?? Hyperkalemia, still present. Renal following.   ?? Community acquired pneumonia rll- completed antibiotic course   ?? "COPD exacerbation" - she has a diagnosis of COPD, but she has no obstruction on PFTs; patient of Dr. Jayme CloudGonzalez  ?? Acute renal failure   ?? Severe Pulmonary HTN with dilated RV and RV failure on echo; CTA negative for PE   ?? LVH/diastolic heart failure  ?? Obesity - ?OSA/OHS  ?? Elevated lft's.   .  [place] showed hepatomegly and dilated portal vein with slow flow  and biliary sludge and gb wall thickening. -? Related to hepatic congestion vs other    ?? 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 DM      PLAN:   ?? Renal following, K is elevated. NO exogenous K given.   ?? Continue high flow O2 with BiPAP as required  ?? Keep prednisone at same dose for now.  ?? IV morphine prn SOB  ?? Nebulized morphine scheduled  ?? Will cautiously increase dose of sildenafil up to 20 tid, currently 10 tid  ?? She has not been stable enough for RHC  ?? Eventually needs diagnostic RHC, vasodilator trial - per report cardiology here would prefer this be done  by Dr. Fraser DinJohri    ?? Incentive spirometer  ?? Bronchodilators   ?? Off antibiotics   ?? Nephrology following  ?? Will eventually need sleep study   ?? Insulin   ?? DVT prophylaxis  ?? DNR. Prognosis remains guarded at best.       Subjective/Interval History:   I have reviewed the flowsheet and previous day???s notes.    11/19/2017: Pt has been about the same. Has been on bipap. NO acute complaints this am.   NO chest pain, no back pain, no leg pain.     11-09-17: remains dependent on high flow O2   6/9 sats drop quickly despite HFNC. " I'll do anything". Pt is tired and SOB. Explained hwo MOrphine can ehlp.     6/8 BIPAP all the time 14/10 and 80%- unable to wean says she feels better.    6/7 low dose sildenafil tolerated. Discussed case with Dr. Fraser DinJohri and Dr. Modesto CharonWong. Dr. Fraser DinJohri  Has reviewed the chart and we discussed  The fact that she appears to have made no progress and actually is more bipap dependent now than she was earlier in the week.  Dr. Fraser DinJOhri thinks pt  May have end stage primary pulm htn.  There may be little we can do .  NOt able to have rt heart cath.  Dr. Fraser DinJOhri says it is ok to try sildenafil but do  So at a smaller starting dose of 10 mg.  Will start tonight and if she drops her bp will stop it.     Dr. Vevelyn PatMiksa had following conversation.  , detailed conversation with pt and her sister  In law.  I told her what we think is going on and that we are likely not going to be able to reverse this.  We discussed risks and possible  benefit of siledenafil and what will likely be the outcome no matter what we do.  She may not survive this.  She appeared to understand.  She was ok with trial of low dose sildenafil.  IN the presence of her sister in law we discussed code status and she will be a  Full no code at this point.  NO shock, cpr , vent or code drug.  cAn use neo if she drops her bp on sildenafil but would not continue the trial if she does  So      11-05-17: Pt is alert and conversant this am on bipap. NO chest pain, no back pain. NO leg pain.   Denies any nausea.     11-04-17: Pt feels tired, weak this am. Has fatigue. She was feeling nauseated and sick to her stomach. No chest pain, no back pain, no abdominal pain, no leg pain.  Has poor appetite this am.       5/30 HFNC 45 LPM. OOB the other day. Slept without BIPAP. ROS otherwise negative    5/29 BIPAP then HFNC this AM. sats hovering close to 88%- 90%. No congestion. No fver. I > O since admission. No preipheral edema. CXr reviewed- no effusion     5/28 No acute events overnight.On bipap.   No severe distress    6/3 - was sleeping without any resp distress when I came in. As can best tell was on high flow overnight.   6/4- says breathing is ok . Admits to rlq discomfort on direst palp - no rebound   6/6 - on bipap . No  resp complaints on bipap and no abd tenderness  But does have nausea.     Objective:  Vital Signs:    Visit Vitals  BP (!) 70/49   Pulse 87   Temp 97.8 ??F (36.6 ??C)   Resp 13   Ht 5\' 6"  (1.676 m)   Wt 135.2 kg (298 lb 1 oz)   SpO2 (!) 88%   BMI 48.11 kg/m??       O2 Device: BIPAP   O2 Flow Rate (L/min): 55 l/min   Temp (24hrs), Avg:97.7 ??F (36.5 ??C), Min:96.7 ??F (35.9 ??C), Max:98.5 ??F (36.9 ??C)     Intake/Output:   Last shift:      No intake/output data recorded.  Last 3 shifts: 06/09 1901 - 06/11 0700  In: 480 [P.O.:480]  Out: 650 [Urine:650]    Intake/Output Summary (Last 24 hours) at 11/24/2017 0738  Last data filed at 11/09/2017 1726  Gross per 24 hour   Intake 480 ml   Output 50 ml   Net 430 ml     Physical exam  Gen: morbidly obese.  no distress  On bipap, alert conversant.    heent - bipap  Neck -  nontender   cardiac - rrr , sinus   Lungs- minimal rales in bases posterior     abd - soft  And no tenderness   Ext-   Large with trace edema of bilateral lower extremities.    neuro - appears alert and  Oriented, seems grossly weak.    Psych: nonfocal. NO overt depression, no anxiety.     Data:     Current Facility-Administered Medications   Medication Dose Route Frequency   ??? sildenafil (antihypertensive) (REVATIO) tablet 20 mg  20 mg Oral TID   ??? morphine injection 4 mg  4 mg Inhalation Q4H RT   ??? albuterol-ipratropium (DUO-NEB) 2.5 MG-0.5 MG/3 ML  3 mL Nebulization Q6H RT   ??? bumetanide (BUMEX) injection 2 mg  2 mg IntraVENous Q12H   ??? patiromer calcium sorbitex (VELTASSA) powder 8.4 g  8.4 g Oral DAILY   ??? calcium acetate (PHOSLO) capsule 667 mg  1 Cap Oral TID WITH MEALS    ??? heparin (porcine) injection 5,000 Units  5,000 Units SubCUTAneous Q12H   ??? nystatin (MYCOSTATIN) 100,000 unit/gram powder   Topical BID   ??? PHENYLephrine (NEO-SYNEPHRINE) 30 mg in 0.9% sodium chloride 250 mL infusion  10-100 mcg/min IntraVENous TITRATE   ??? predniSONE (DELTASONE) tablet 10 mg  10 mg Oral DAILY WITH BREAKFAST   ??? senna-docusate (PERICOLACE) 8.6-50 mg per tablet 2 Tab  2 Tab Oral BID   ??? polyethylene glycol (MIRALAX) packet 17 g  17 g Oral DAILY   ??? insulin lispro (HUMALOG) injection   SubCUTAneous AC&HS   ??? sodium chloride (NS) flush 5-40 mL  5-40 mL IntraVENous Q8H   ??? aspirin delayed-release tablet 81 mg  81 mg Oral DAILY                Labs:  Recent Labs     11/14/2017  0428   WBC 15.1*   HGB 13.3   HCT 41.3   PLT 123*     Recent Labs     11/23/2017  0428   NA 131*   K 5.7*   CL 98   CO2 19*   GLU 144*   BUN 129*   CREA 3.84*   CA 8.1*   MG 2.9*   PHOS 8.5*   ALB 3.5   TBILI 2.9*   SGOT 510*   ALT 890*     No results for input(s): PHI, PCO2I, PO2I, HCO3I, FIO2I in the last 72 hours.  Imaging:  I have personally reviewed the patient???s radiographs and have reviewed the reports:  cxr portable: 11-05-17: IMPRESSION:  ?? no overt decompensated chf - mild increased markings  On portable film.           Constance Goltz, MD

## 2017-11-10 NOTE — Progress Notes (Signed)
[facility] [phone] (COPE)  [place] [phone] (COPE)    Diagnoses: Brittney Shelton is a 67 y.o. with a past history of COPD, CHF, DM, and HTN, who was admitted on 10/14/2017 from home with a diagnosis of acute on chronic respiratory failure with hypoxia, along with acute on chronic diastolic CHF.    Currently patient is in CCU, on high flow O2, minimally responsive and critically ill. Palliative team met with patient in the morning and patient was working very hard to stay awake and communicate verbally. Patient noted that her mPOA is her niece and gave palliative team permission to call her niece to set up a family meeting. Palliative team had a family meeting today where family shared that patient had at various times voiced with different members that she knew she may not make it out of the hospital and that she was "good with Jesus", she had also verbalized a desire to be No Code.       GOALS OF CARE:  Patient/Health Care Proxy Stated Goals: Comfort    TREATMENT PREFERENCES:   Code Status: DNR    Advance Care Planning:  '[]'  The Pall Med Interdisciplinary Team has updated the ACP Navigator with [place] and Patient Capacity    Primary Decision [facility] ([place]):   Primary Decision Maker (Active): Bronwen Betters - Unknown [phone]    Secondary Decision Maker: Smith,Ernest - Brother - [phone]    Secondary Decision Maker: Harlon Ditty - Unknown [phone]  Relationship to patient:  '[x]'  Named in a scanned document   '[]'  Legal Next of Kin  '[]'  Guardian    Medical Interventions: Comfort measures     Artificially Administered Nutrition: No feeding tube       Family Meeting Documentation    Participants: Niece Emelda Fear [facility]), brothers Jaquelyn Bitter and Harlon Ditty, their wives Estill Bamberg and Madison. Palliative team of Tiffany Pajic NP and Jacelyn Grip LCSW    Discussion: Family somewhat surprised about patient's quick decline, they  were not aware of critically sick patient is and were educated that patient could die at any time, with or without aggressive measures. Family shared that they know patient voiced a desire to be comfortable and has shared that she is aware that she is very sick. At the same time, she may have been protective of family as various members shared that patient told them, "I will go on that trip (a ladies trip was planned)", "don't worry about me". She also had voiced that she did not think she would be able to return home. (Please see detailed notes from Rachel Bo NP).     Family educated about her medical condition, discussed focus on comfort vs current measures. Family chose comfort measures, based on patient's conversations with them recently. Hospice consult was placed, discussed briefly with hospice liaison, [place] Psychologist, counselling. Let CM Donalyn Hohman know about family decision for hospice.     Family made aware that patient's high flow O2 would be weaned and goal would be nasal canula/comfort if possible.      Family asked palliative team protocol after patient dies, patient had made some arrangements about her funeral per family. Briefly shared this with family.    Outcome / Plan: Patient to transition to hospice. Liaison to meet with family today.

## 2017-11-10 NOTE — Hospice (Signed)
Referral received, acceptance pending, sent consult out to hospice liaison, thank you.

## 2017-11-10 NOTE — Progress Notes (Signed)
[place]: [phone] ([phone])  [place]: [phone] ([phone])    Patient seen briefly with Rachel Bo NP, chart reviewed and spoke to bedside RN, Janeann Merl regarding patient. Apparently, patient made a decision for No Code status. She has been very lethargic, but appears to be decisional and seems to understand conversation, answers appropriately, although very softly.    Patient is know to this writer from meeting in 10/18 when palliative care was consulted. At that time LCSW had met with patient and her brother, Jaquelyn Bitter, who she lives with. (He also goes by Meda Coffee, both are the same person). Jaquelyn Bitter was here earlier this morning, no family present at this time, but a family member did come by to see her as we were leaving,     Chart notes AMD completed 07/2015 and primary mPOA is Emelda Fear [phone] (niece). Secondary mPOA is Ernest/Nelson Tamala Julian [phone] (brother).     Telephone call made to [facility], to see if she could come in to meet with palliative team to further clarify [place], see if family is open to hospice support. Patient may be able to participate in this decision making, or may not. Also would like to offer emotional support to family and see if they have any questions regarding patient's current situation.     Larene Beach will meet with Palliative team at 3 pm. She noted that she would give Jaquelyn Bitter a call to ask him to come as well.

## 2017-11-10 NOTE — Progress Notes (Signed)
Responded to page from [place] staff, notifying patient's death. On arrival, met with the family and introduced self. They requested prayer, which was offered. Offered words of comfort and encouragement. Explained the process regarding funeral home. Family provided funeral home's name to the nurse.    Rev. Danise Edge, Chaplain  Paging Service: [phone])

## 2017-11-10 NOTE — Progress Notes (Signed)
Chaplain followed up with patient in CCU. Prayer requested and offered. Consulted with Herbert SetaHeather, patient's nurse. Plan of care is to follow up as needed with patient. Chaplain Thea AlkenSusan Varner, M.Corporate investment bankerDiv   Chaplain Paging Service [phone])

## 2017-11-30 DEATH — deceased

## 2018-03-19 ENCOUNTER — Encounter: Attending: Cardiovascular Disease | Primary: Internal Medicine

## 2019-11-22 IMAGING — DX CHEST PA AND LATERAL
1 series · 2 of 2 positions shown · non-contrast
Comparison: none

CLINICAL INDICATION:  COPD, former smoking history
TECHNIQUE: PA and lateral radiographs of the chest are compared to .

[Series 1: PA · U · 0.14mm/px · 2 of 2 slices shown]
[im 1/2]
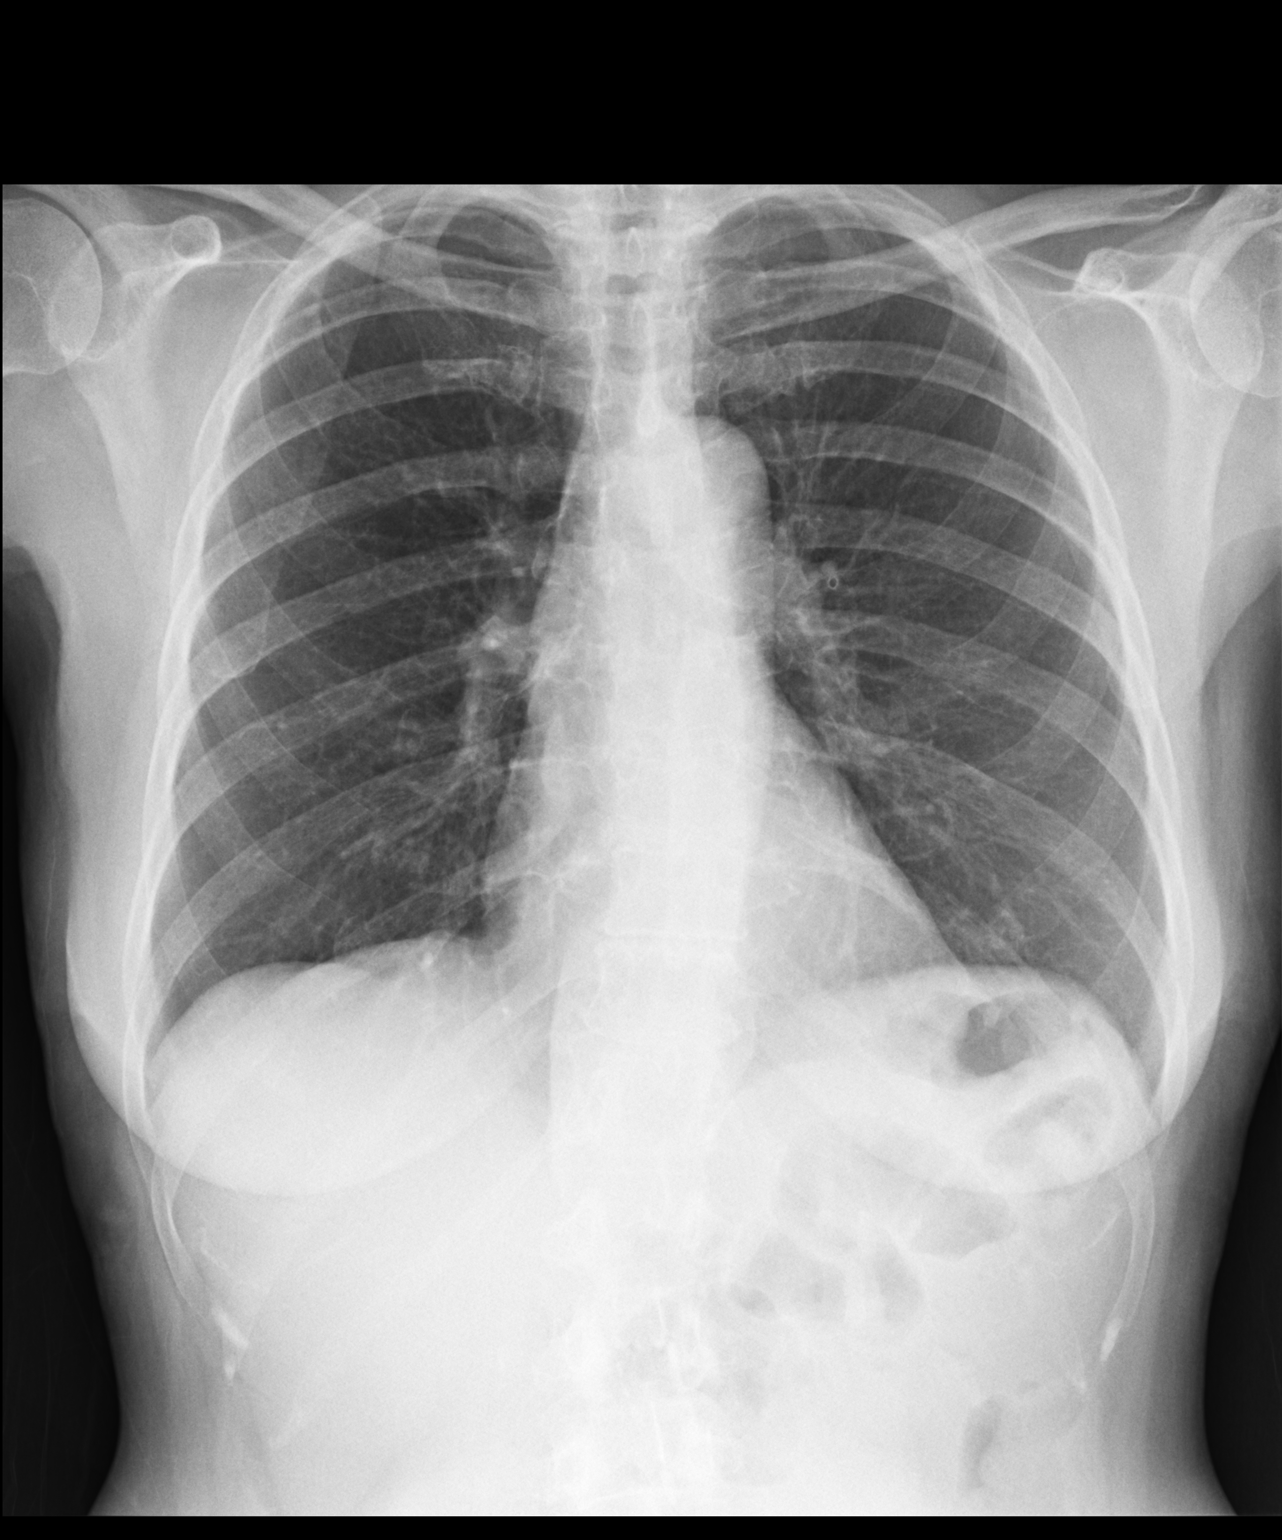
[im 2/2]
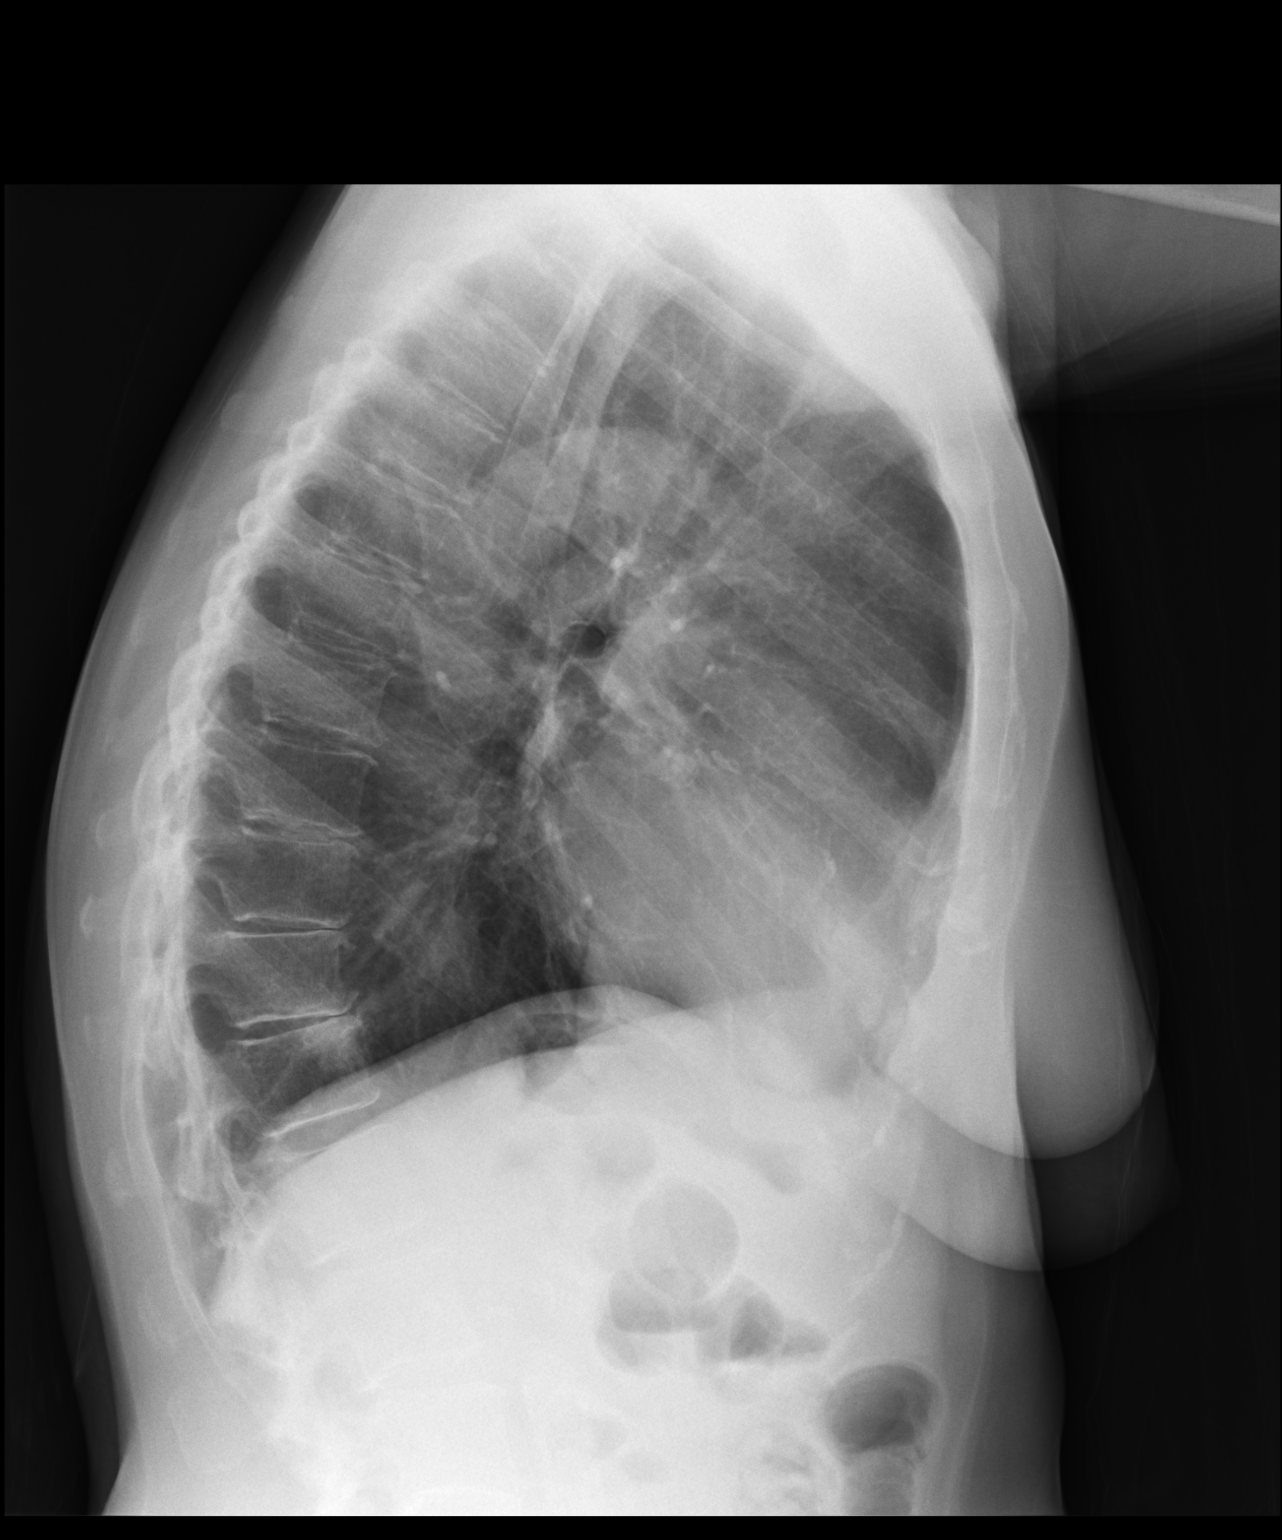

[2 of 2 positions shown; findings below may reference images not displayed]

FINDINGS: Lungs are hyperinflated. There is no infiltrate or effusion. There is 
mild biapical fibrosis. No discrete pulmonary nodule. No significant bronchial 
wall thickening. There are degenerative changes. The heart is not enlarged.
IMPRESSION: Hyperinflation compatible with COPD. 
No active cardiopulmonary findings. No pulmonary nodule identified. If indicated 
lung screening CT would be useful for further evaluation.

## 2019-11-22 IMAGING — MG MAMMOGRAPHY SCREENING BILATERAL 3D TOMOSYNTHESIS WITH CAD
8 series · 8 of 24 positions shown · non-contrast
Comparison: 11/22/2019 and dating back to 09/23/2007. 
BREAST DENSITY: (Level B) There are scattered areas of fibroglandular density.

MAMMOGRAPHY SCREENING BILATERAL 3D TOMOSYNTHESIS WITH CAD, 11/22/2019 [DATE]: 
CLINICAL INDICATION: Screening exam.
TECHNIQUE: Digital bilateral mammograms and 3-D Tomosynthesis were obtained. 
These were interpreted both primarily and with the aid of computer-aided 
detection system.

[R CC]
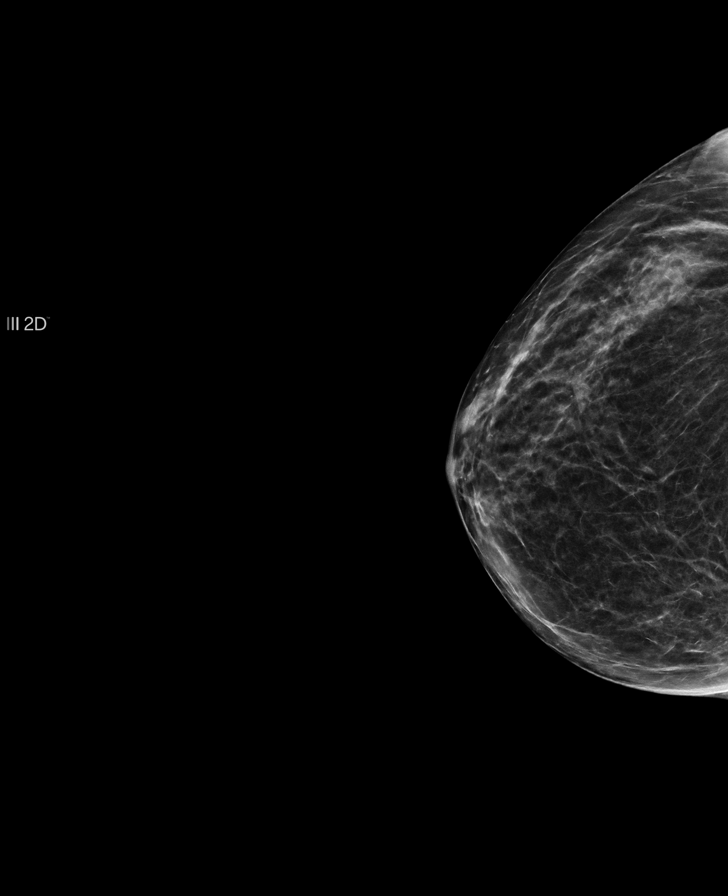

[L CC]
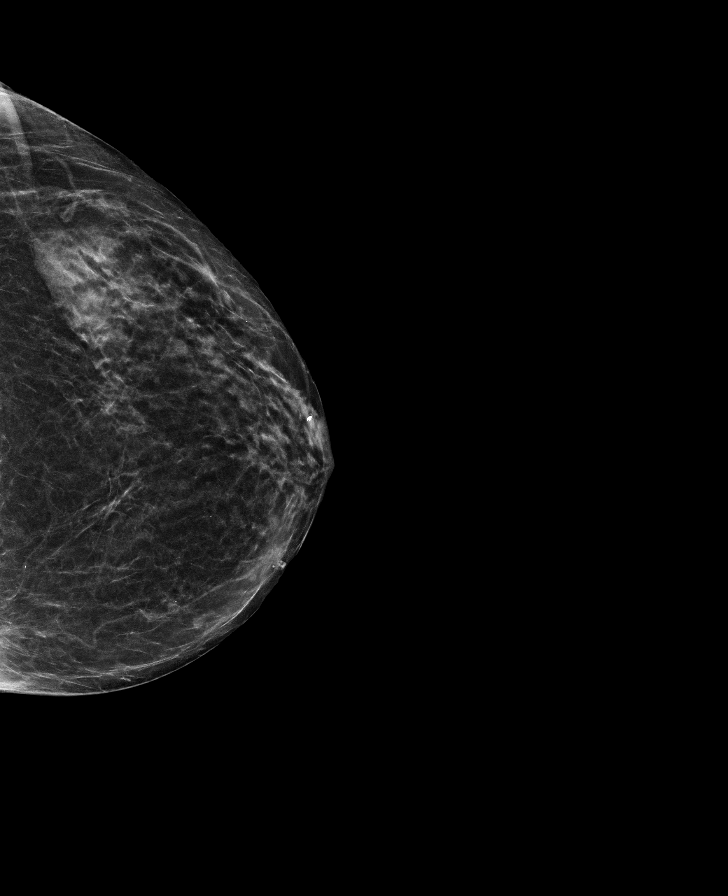

[R MLO]
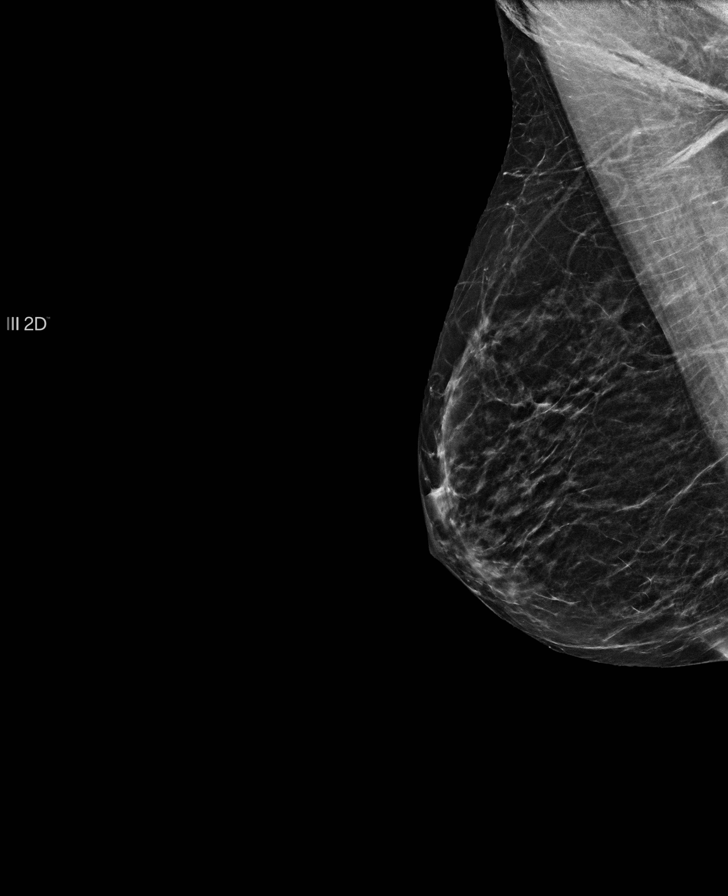

[L MLO]
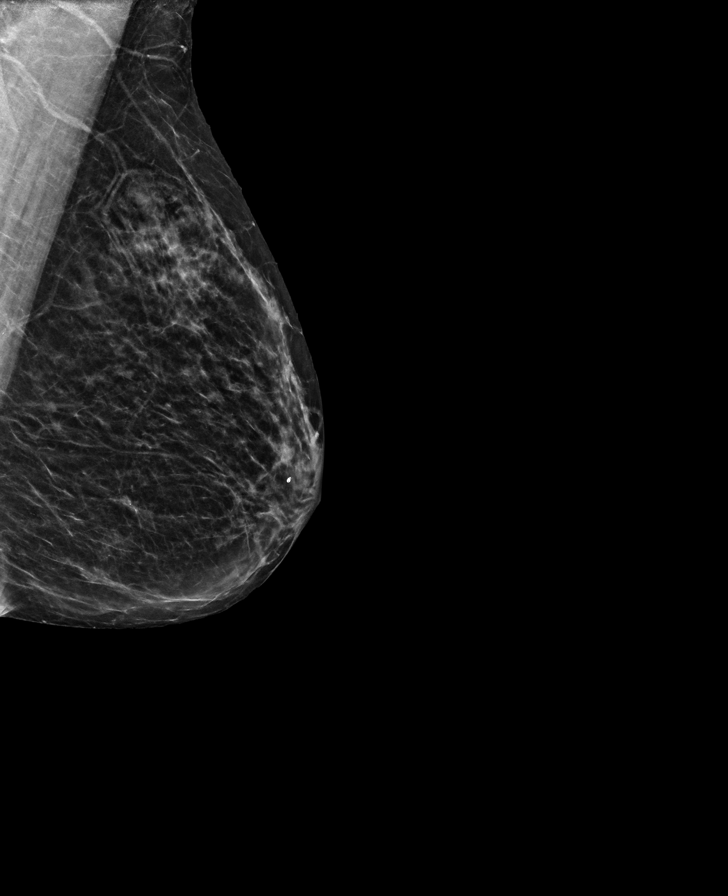

[R CC tomo · tomo slice 25/48.0]
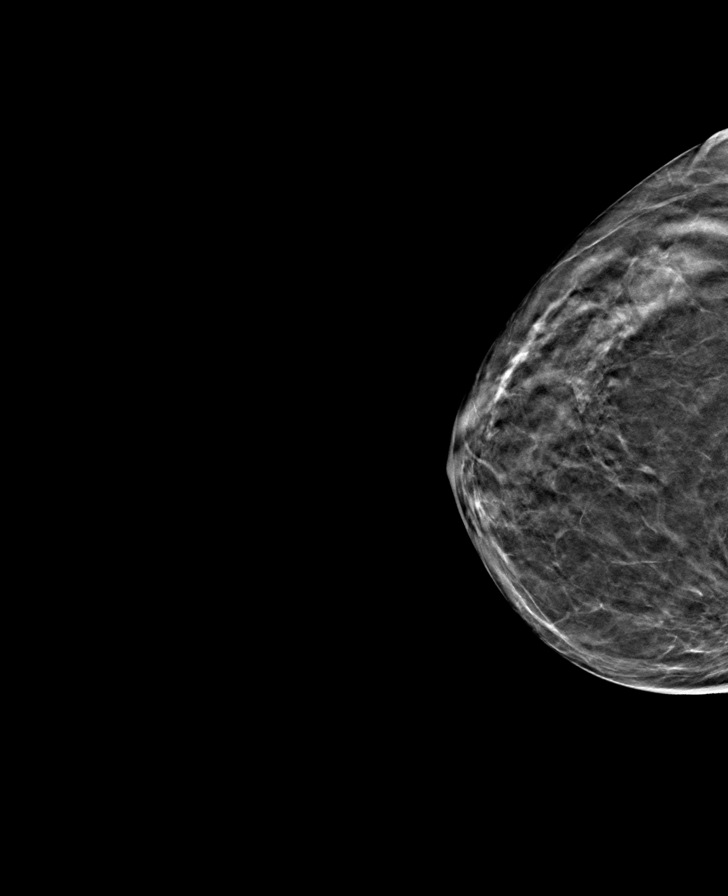

[R MLO tomo · tomo slice 24/47.0]
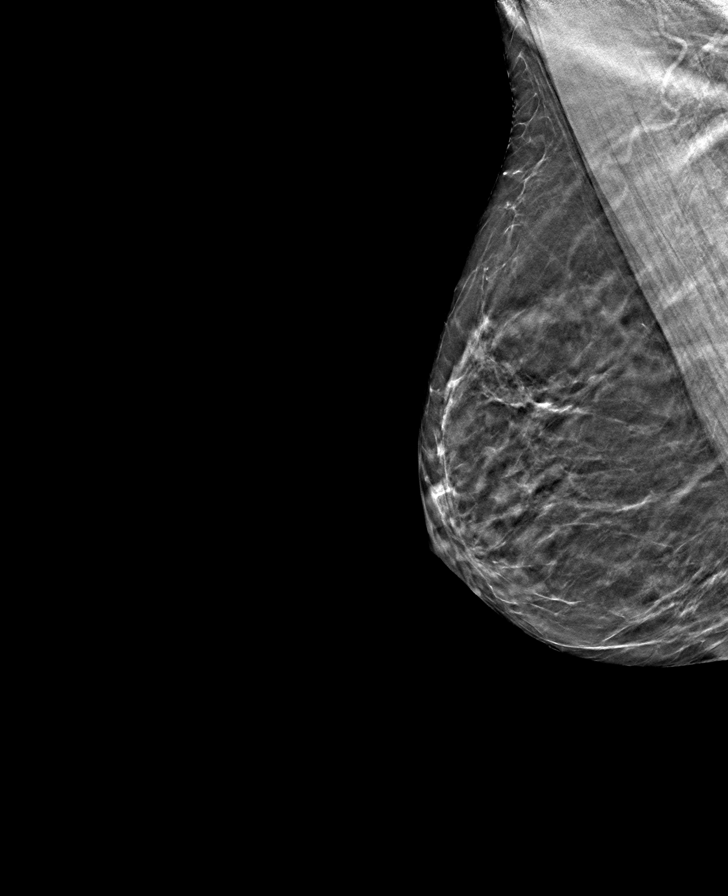

[L CC tomo · tomo slice 23/46.0]
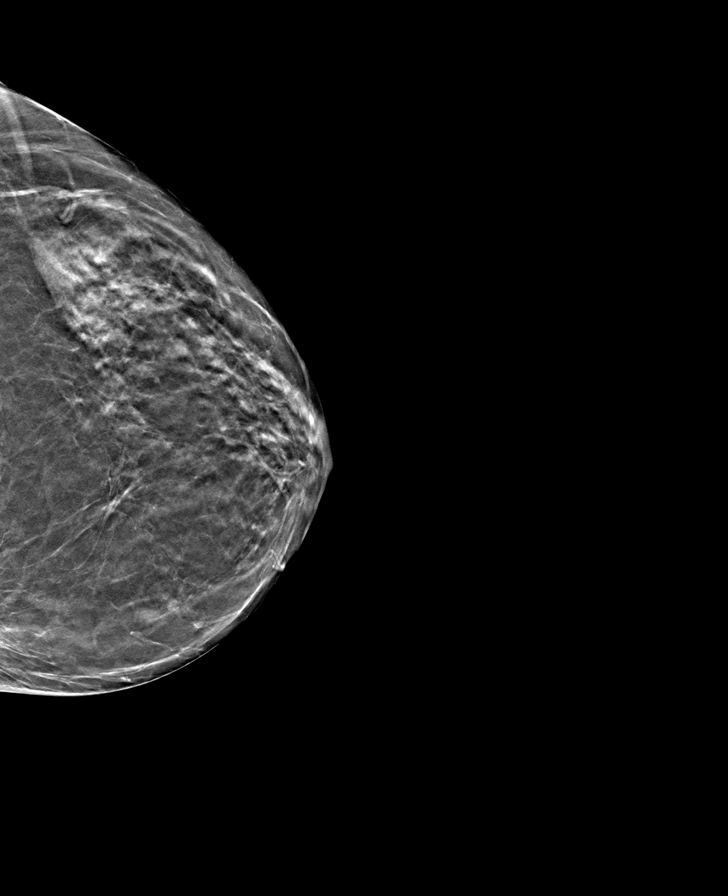

[L MLO tomo · tomo slice 23/46.0]
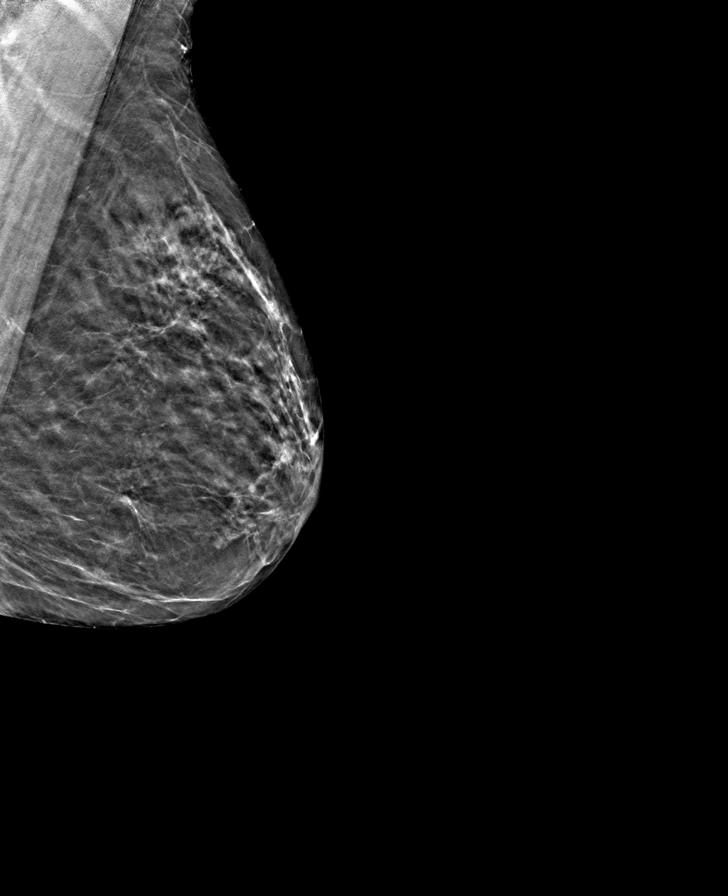

[8 of 24 positions shown; findings below may reference images not displayed]

FINDINGS: Overall stable mammographic appearance. No mass, area of architectural 
distortion or suspicious calcification. No mammographic evidence of malignancy.
IMPRESSION: ( BI-RADS 2) Benign findings. Routine mammographic follow-up is recommended.

## 2021-04-16 IMAGING — MR MRI BRAIN WITHOUT CONTRAST
5 of 10 series · 25 of 48 positions shown · IV contrast (gadolinium)
Comparison: Head CT 10/14/2020., Head CT 10/13/2020.

________________________________________________________________________________________________ 
MRI BRAIN WITHOUT CONTRAST, 04/16/2021 [DATE]: 
CLINICAL INDICATION: 70-year-old female with intracerebral hemorrhage. Follow-up 
exam postaneurysm.
TECHNIQUE: Multiplanar, multiecho position MR images of the brain were performed 
without intravenous gadolinium enhancement. Patient was scanned on a 3T magnet.

[Series 401: FLAIR · axial · 5.0mm · 0.60mm/px · z∈[-55,+99]mm · 3 of 27 slices shown]
[im 1/27]
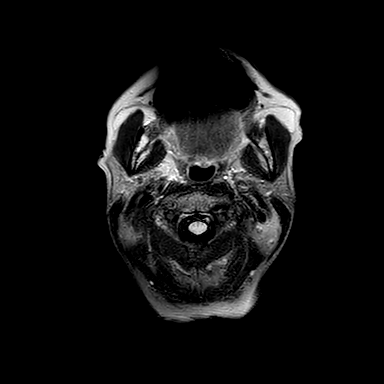
[im 14/27]
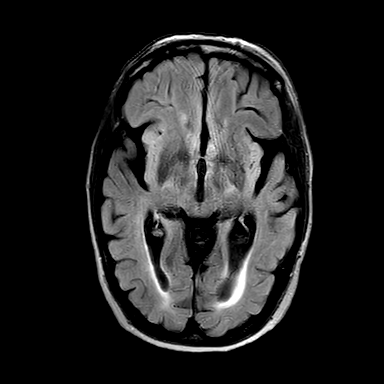
[im 27/27]
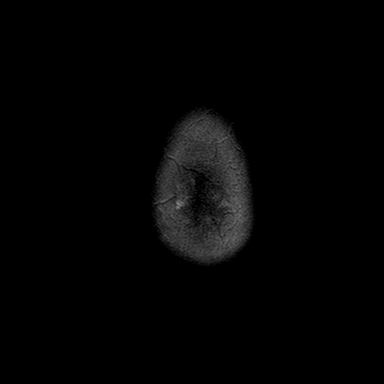

[Series 601: SWI · axial · 3.0mm · 0.53mm/px · z∈[-40,+107]mm · 9 of 100 slices shown (1 of 2)]
[im 1/100]
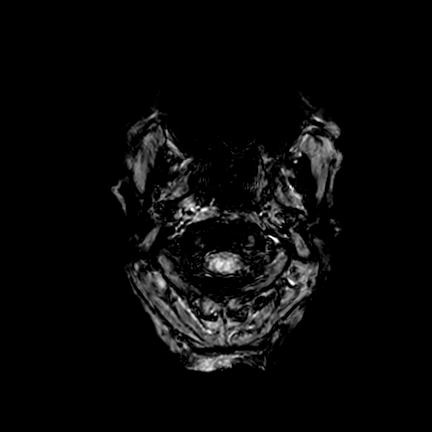
[im 17/100]
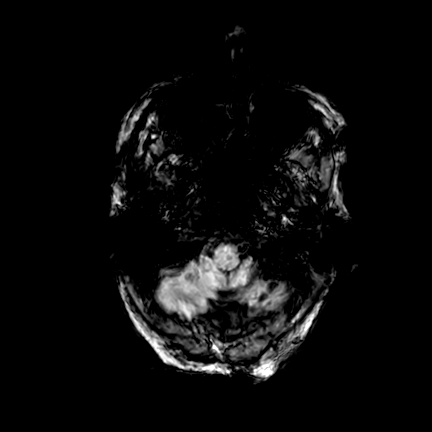
[im 34/100]
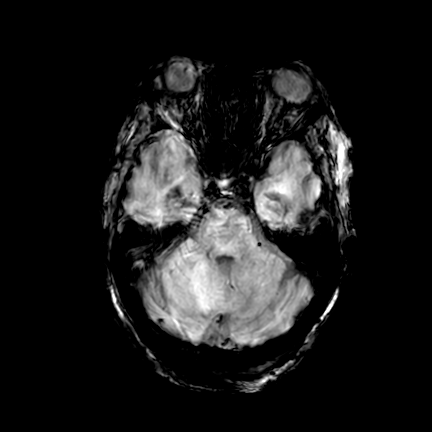
[im 42/100]
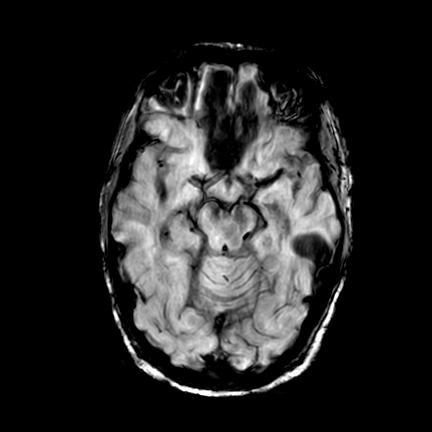
[im 50/100]
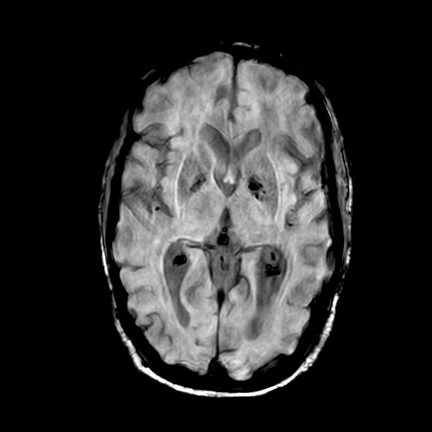
[im 58/100]
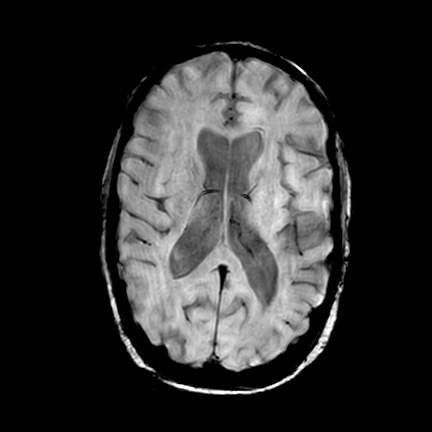
[im 67/100]
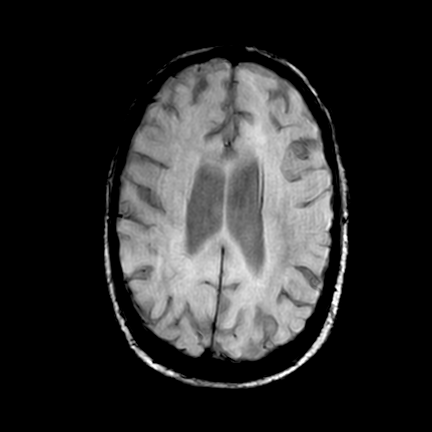
[im 83/100]
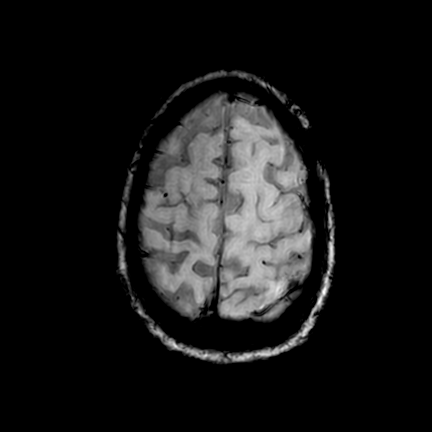
[im 100/100]
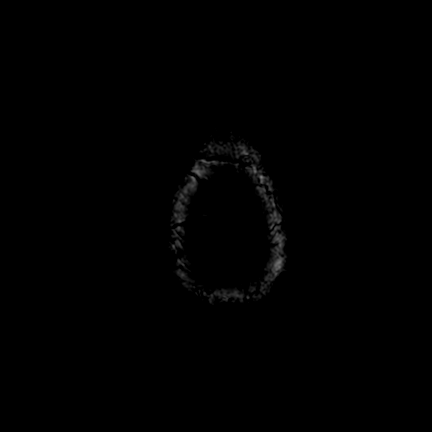

[Series 602: SWI · axial · 10.0mm · 0.53mm/px · z∈[-41,+42]mm · 5 of 78 slices shown (2 of 2)]
[im 1/78]
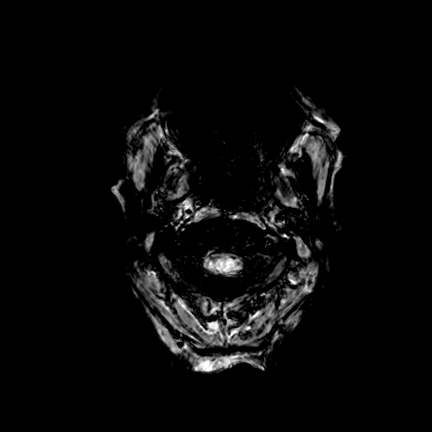
[im 9/78]
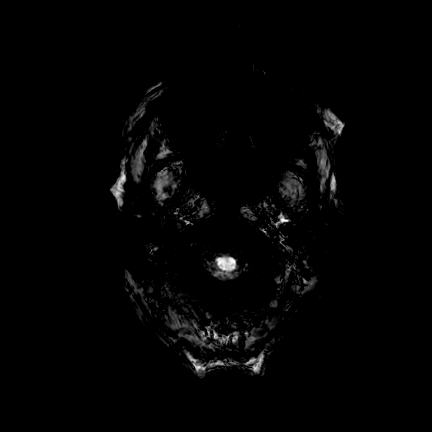
[im 26/78]
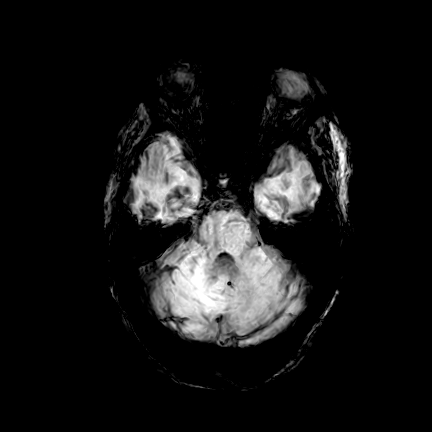
[im 35/78]
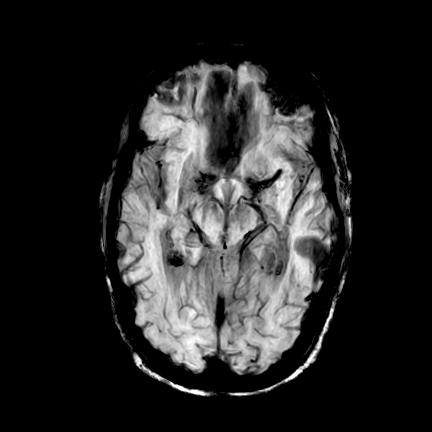
[im 43/78]
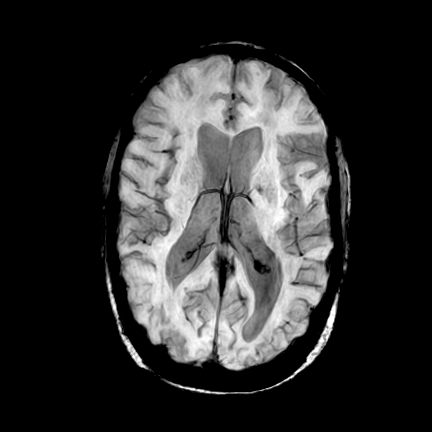

[Series 701: T2 · axial · 5.0mm · 0.41mm/px · z∈[-55,+99]mm · 3 of 27 slices shown (1 of 2)]
[im 1/27]
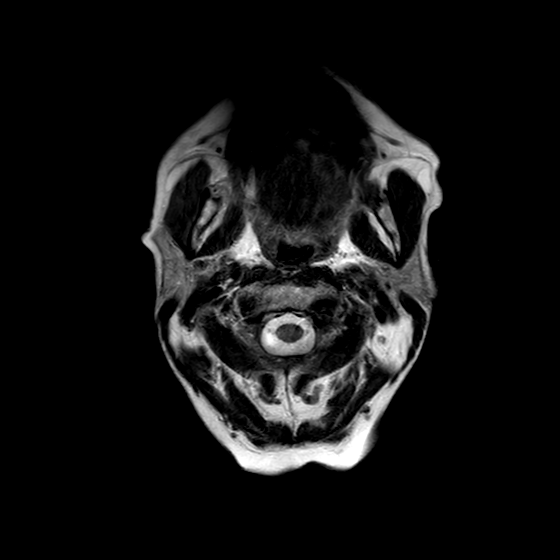
[im 14/27]
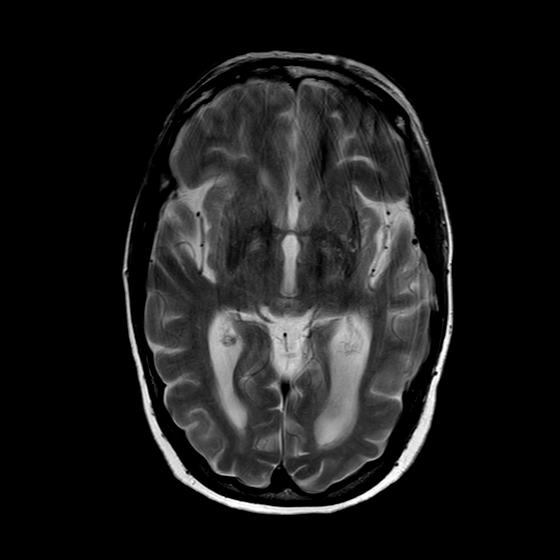
[im 27/27]
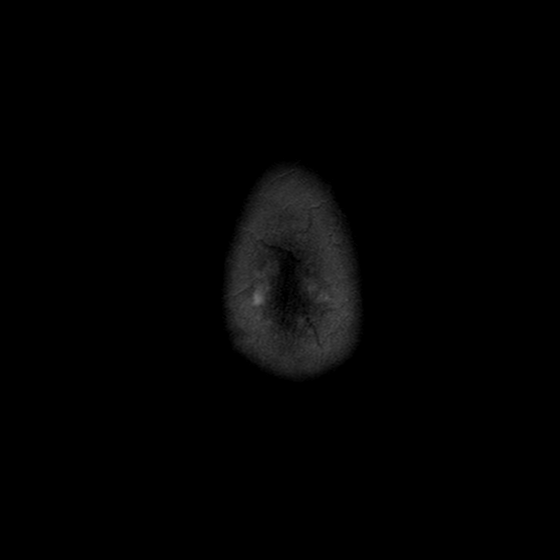

[Series 801: T2 · coronal · 4.0mm · 0.47mm/px · 5 of 36 slices shown (2 of 2)]
[im 1/36]
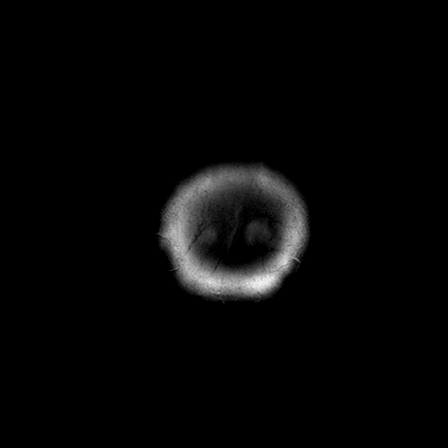
[im 9/36]
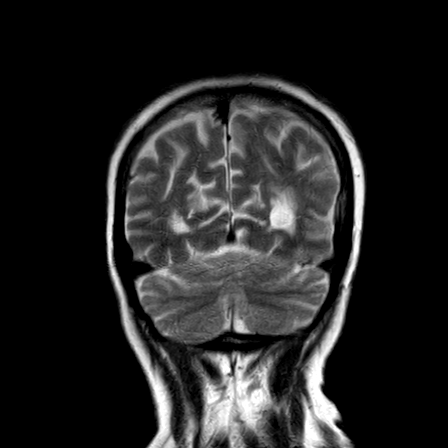
[im 18/36]
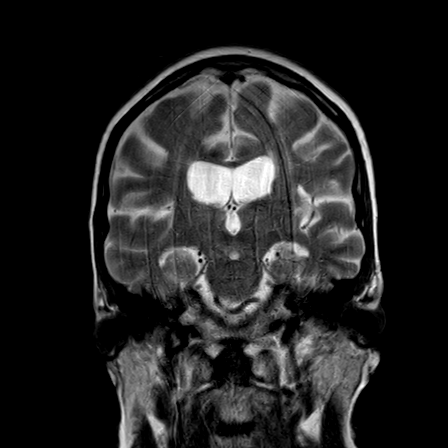
[im 27/36]
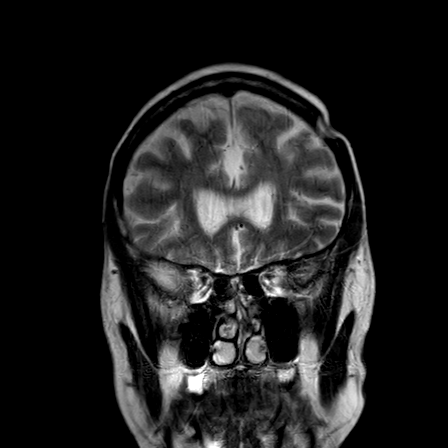
[im 36/36]
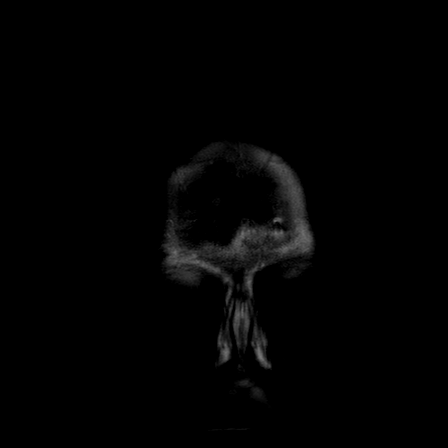

[25 of 48 positions shown; findings below may reference images not displayed]

FINDINGS: Intracranial contents: No acute or subacute infarct. Again noted are left-sided 
frontal parietal burr hole changes. There remains minimal effacement of the left 
cerebral sulci and diffuse fashion, but without other mass effect. This likely 
reflects sequelae of the previously noted left subdural fluid collection. 
Currently, there is no evidence of residual extra-axial fluid collection. No 
herniation pattern. No mass, mass effect or midline shift. Minimal nonspecific 
subcortical, deep white matter, and periventricular T-2 FLAIR hyperintensities 
are most likely related to chronic small vessel vascular change. Mild 
generalized brain volume loss without lobar specific pattern. There are 
appropriate skull base vascular flow voids. Normal position tonsils. Sella 
region is unremarkable given limitations of technique. Tiny old appearing left 
cerebellar hemispheric infarct. No evidence of residual intracranial blood 
products. 
Orbits, sinuses, and mastoids: No acute orbital pathology. Bilateral 
pseudophakia. Sinuses are well pneumatized. No mastoid effusion. 
Bones and soft tissues: Normal marrow signal. Left-sided frontoparietal burr 
holes. No soft tissue swelling.
IMPRESSION: Status post left frontal parietal burr hole subdural hematoma evacuation. No 
residual extra-axial fluid collection or herniation pattern. There remains 
minimal effacement of the left cerebral sulci but without other mass effect, 
likely related to sequelae from previous left-sided subdural fluid collection. 
No acute intracranial process.

## 2022-04-16 IMAGING — MG MAMMOGRAPHY SCREENING BILATERAL 3[PERSON_NAME]
8 series · 9 of 24 positions shown · non-contrast
Comparison: Comparison was made to prior examinations.

________________________________________________________________________________________________ 
MAMMOGRAPHY SCREENING BILATERAL 3SILVESTER MOSKOWITZ, 04/16/2022 [DATE]: 
CLINICAL INDICATION: Encounter for screening mammogram.
TECHNIQUE: Digital bilateral mammograms and 3-D Tomosynthesis were obtained. 
These were interpreted both primarily and with the aid of computer-aided 
detection system.  
BREAST DENSITY: (Level B) There are scattered areas of fibroglandular density.

[R MLO]
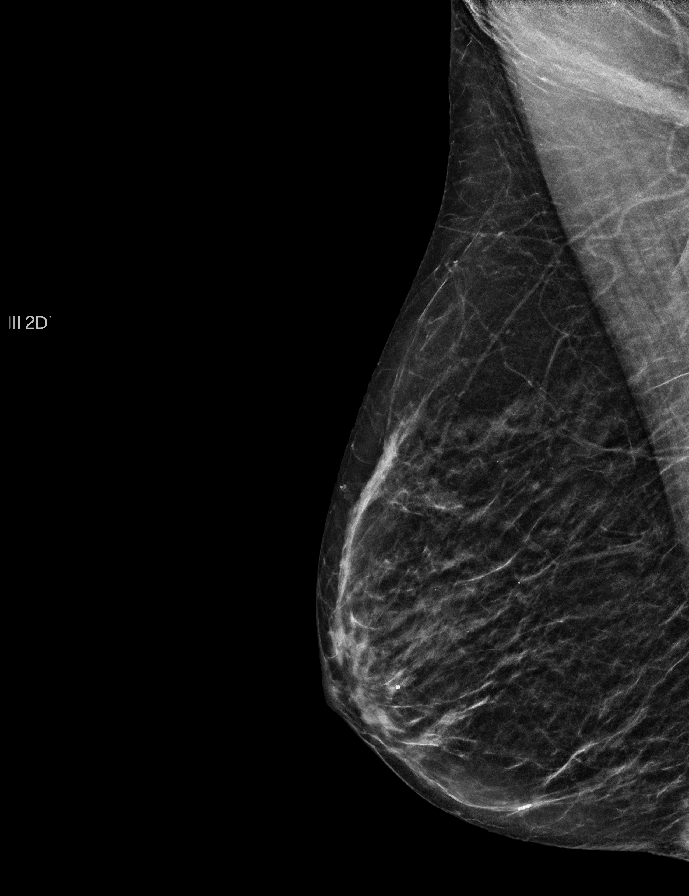

[R CC]
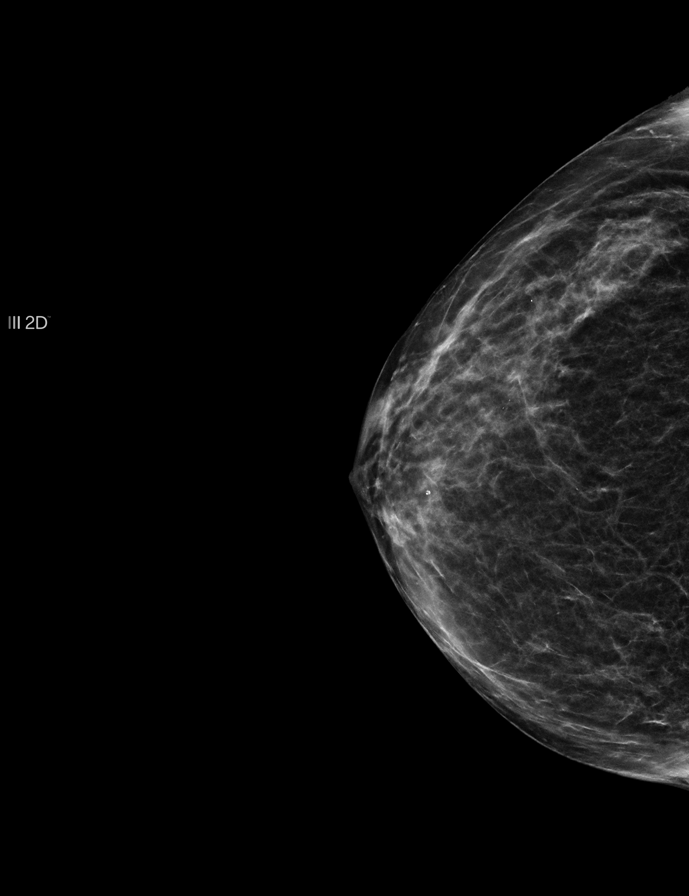

[L CC]
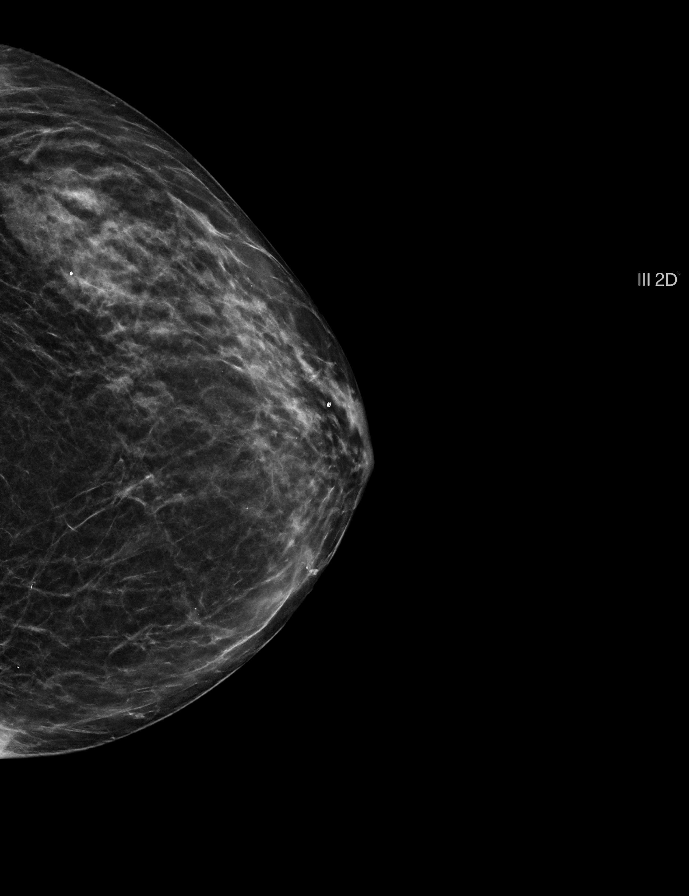

[L MLO]
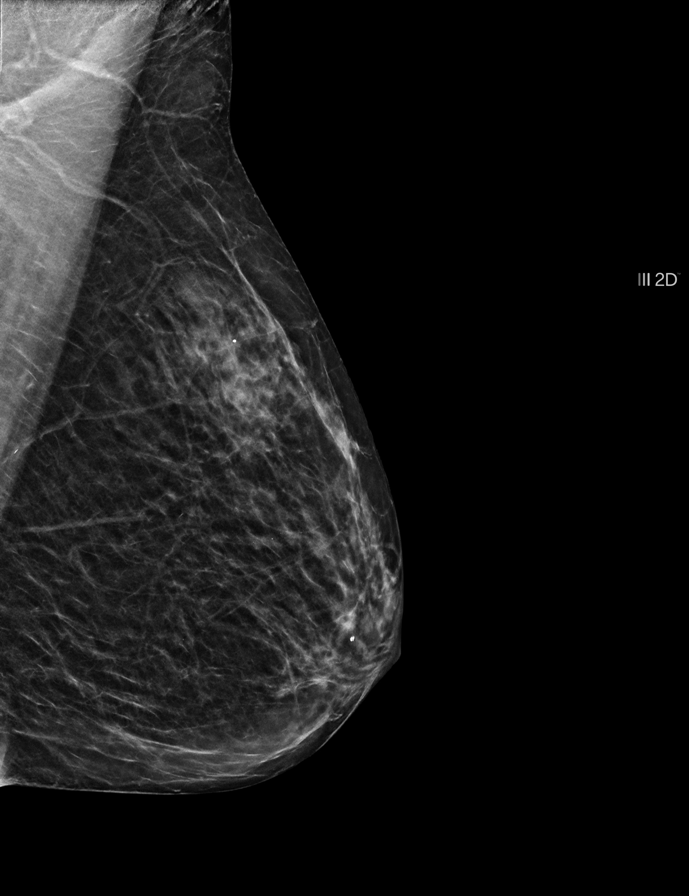

[R MLO tomo · 2 of 41 frames shown]
[frame 14/41]
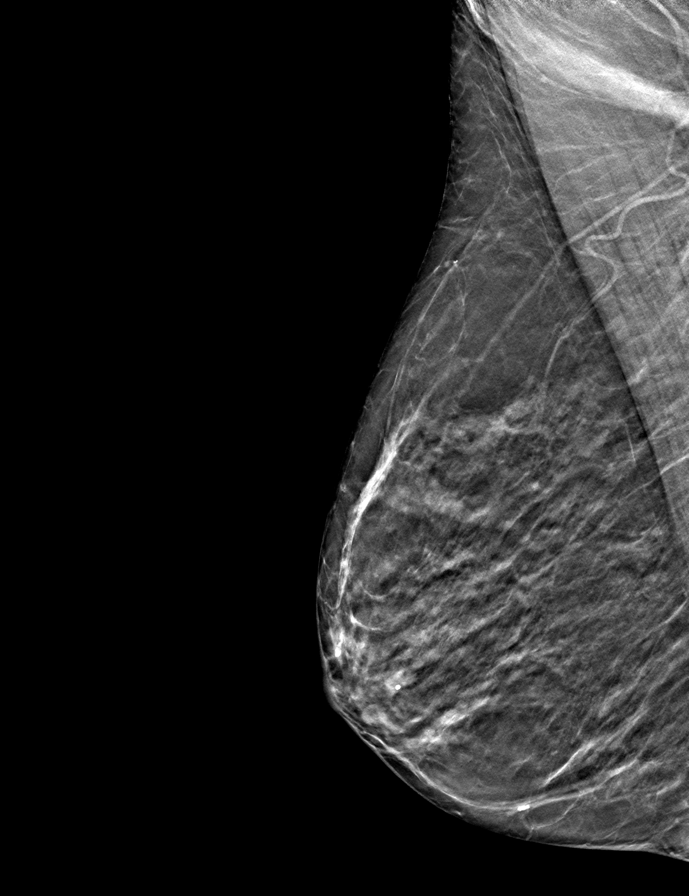
[frame 21/41]
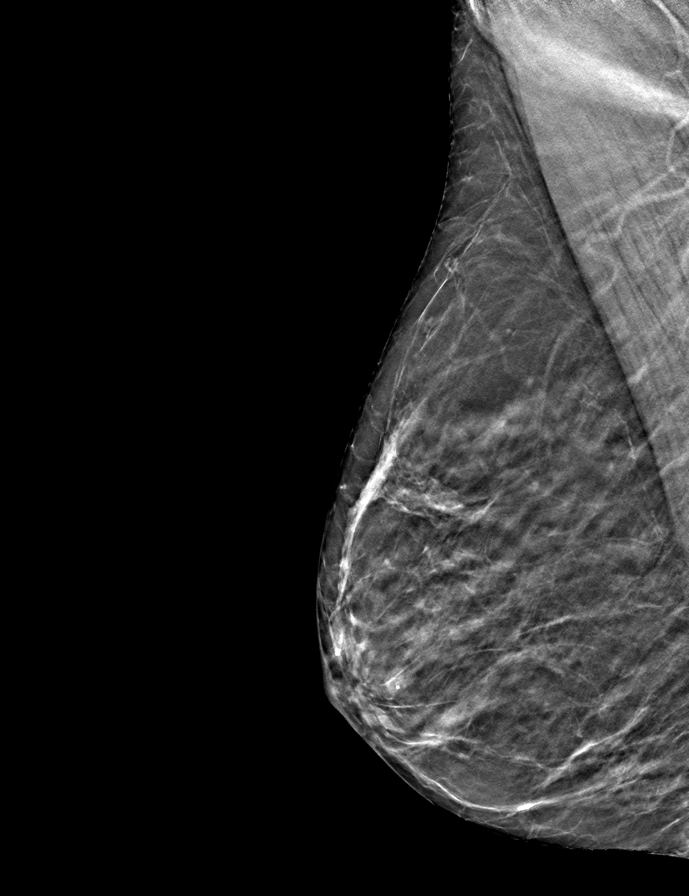

[L CC tomo · tomo slice 21/42.0]
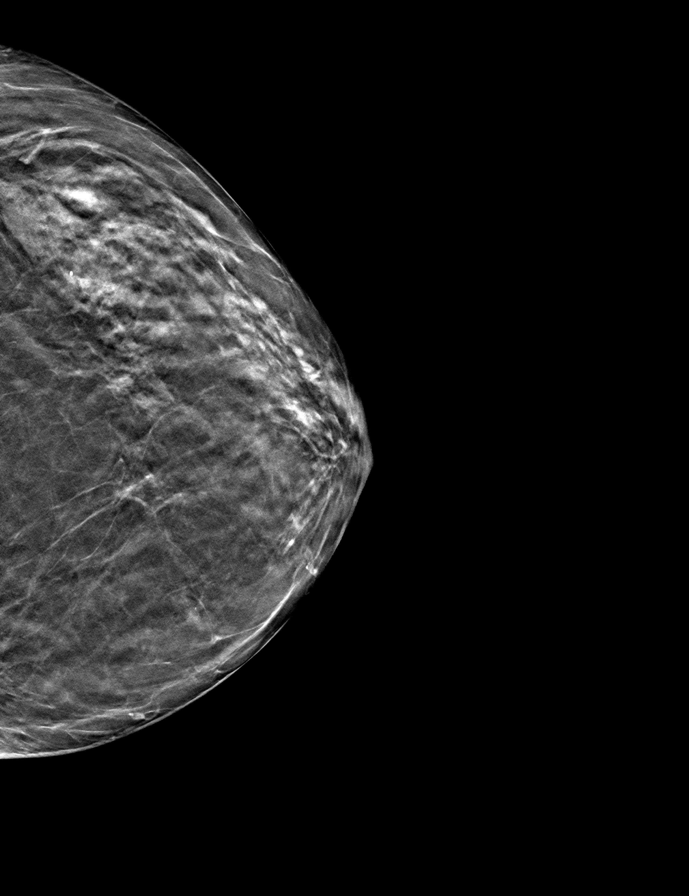

[L MLO tomo · tomo slice 21/42.0]
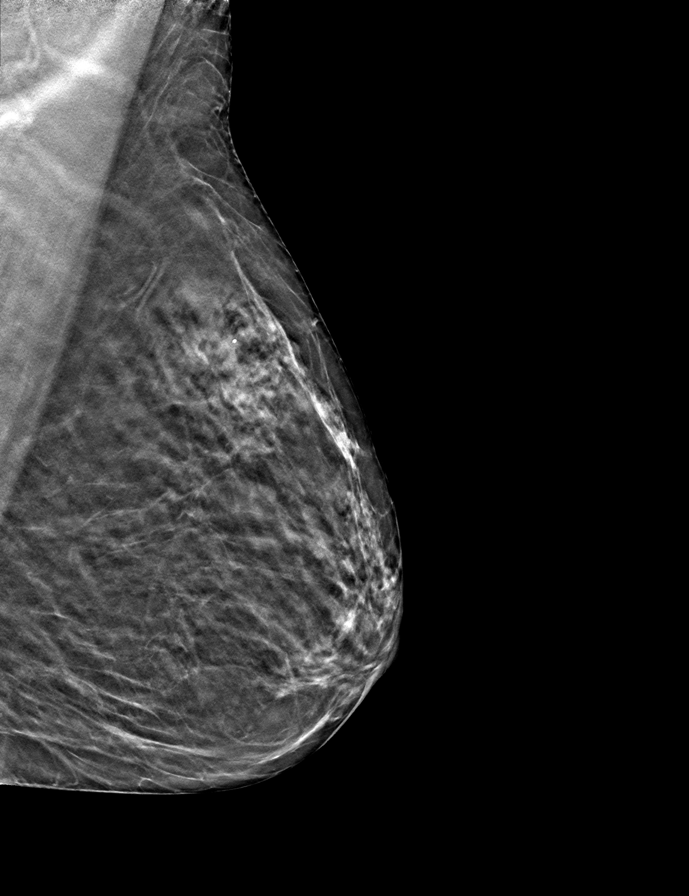

[R CC tomo · tomo slice 21/42.0]
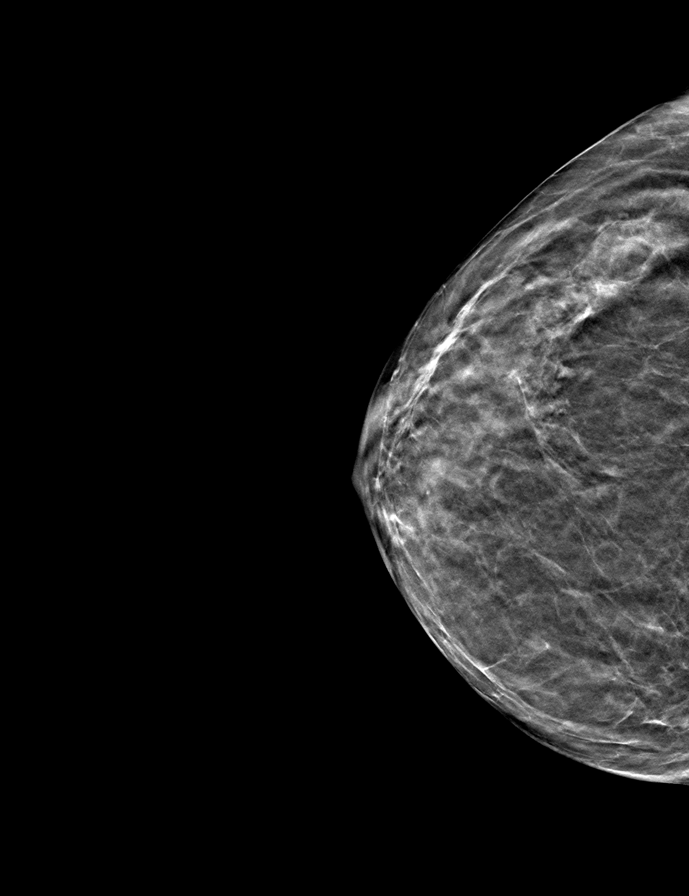

[9 of 24 positions shown; findings below may reference images not displayed]

FINDINGS: No suspicious mass, calcifications, or area of architectural 
distortion in either breast.
IMPRESSION: (BI-RADS 2) Benign findings. Routine mammographic follow-up is recommended.

## 2023-03-27 IMAGING — MR MRI LUMBAR SPINE WITHOUT CONTRAST
6 of 8 series · 12 of 48 positions shown · IV contrast (gadolinium)
Comparison: CTA abdomen pelvis and lower extremities November 06, 2009.

________________________________________________________________________________________________ 
MRI LUMBAR SPINE WITHOUT CONTRAST, 03/27/2023 [DATE]: 
CLINICAL INDICATION: Other spondylosis with radiculopathy, lumbosacral region , 
chronic low back pain.
TECHNIQUE: Multiplanar, multiecho position MR images of the lumbar spine were 
performed without intravenous gadolinium enhancement. Patient was scanned on a 
1.5T magnet

[Series 101: survey · axial · 10.0mm · 1.25mm/px · z∈[-33,+201]mm · 2 of 10 slices shown]
[im 1/10]
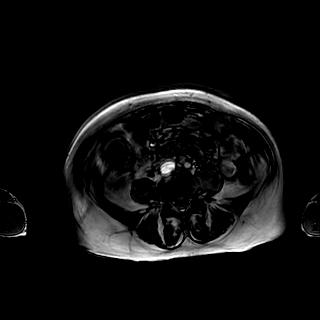
[im 10/10]
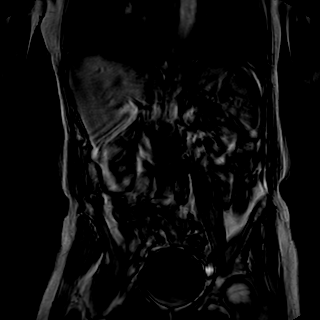

[Series 201: t2w_cor-surv · coronal · 6.0mm · 0.62mm/px · 2 of 10 slices shown]
[im 1/10]
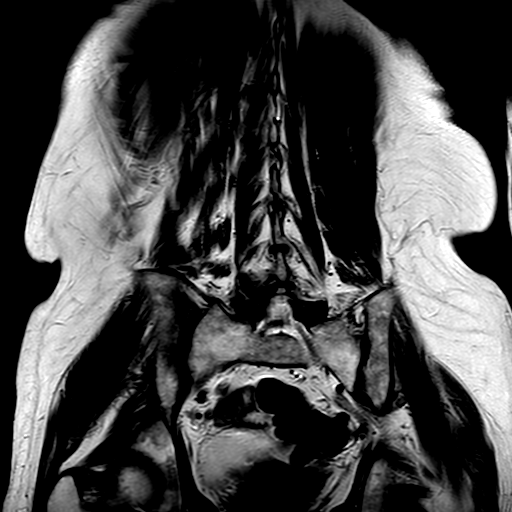
[im 10/10]
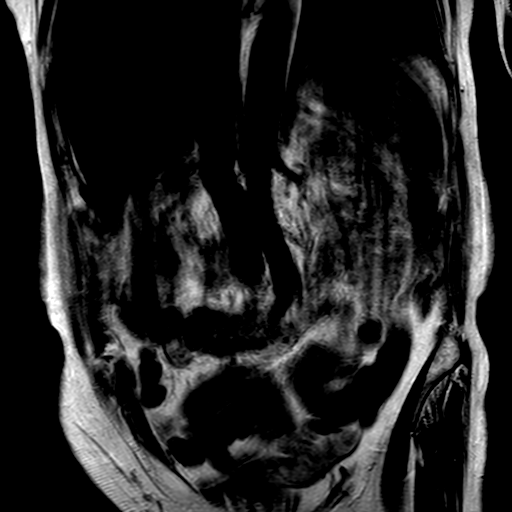

[Series 301: t1_tse_sag · sagittal · 4.0mm · 0.44mm/px · 2 of 19 slices shown]
[im 1/19]
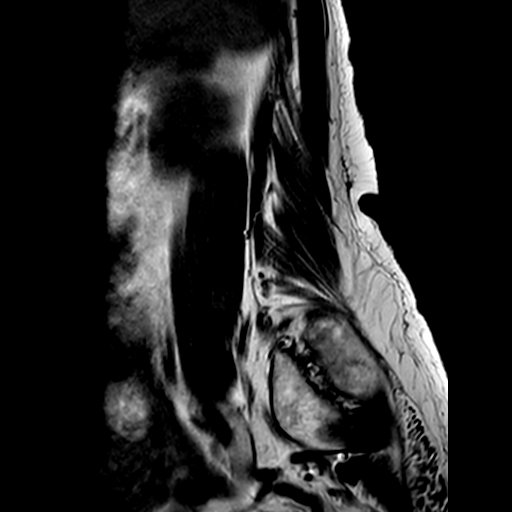
[im 19/19]
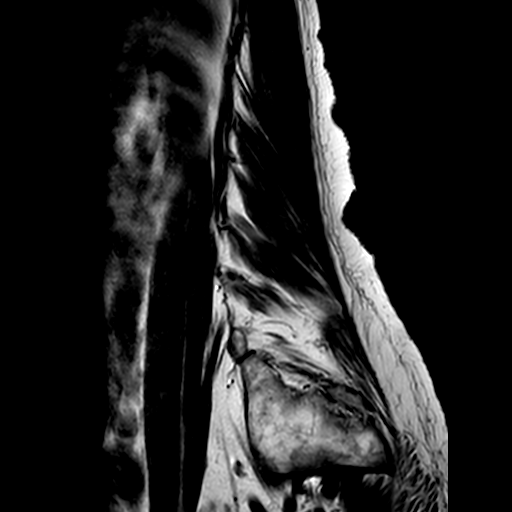

[Series 402: (id)_mdixon_tse · sagittal · 4.0mm · 0.53mm/px · 2 of 19 slices shown]
[im 1/19]
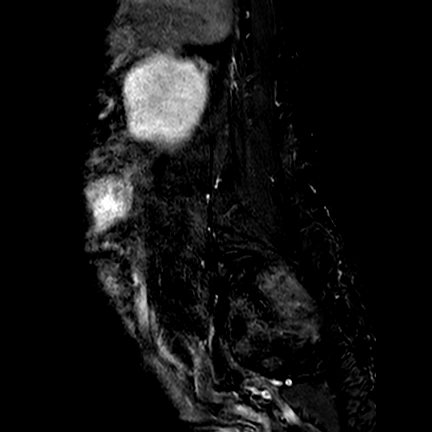
[im 19/19]
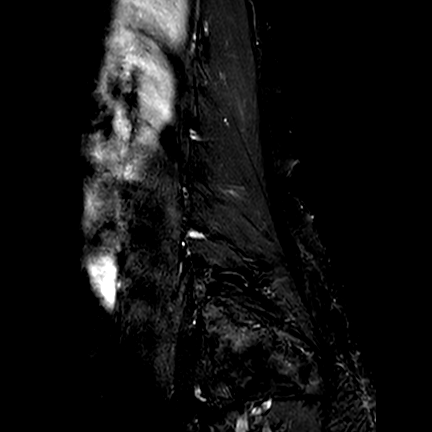

[Series 403: st2w_mdixon_tse · sagittal · 4.0mm · 0.53mm/px · 2 of 19 slices shown]
[im 1/19]
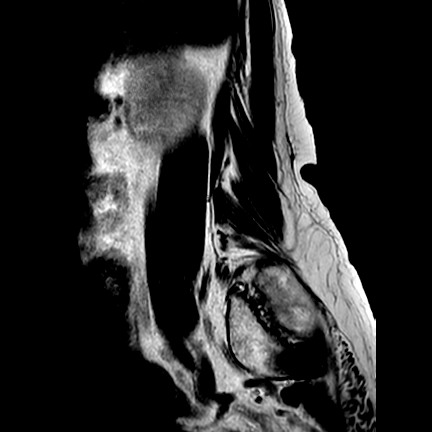
[im 19/19]
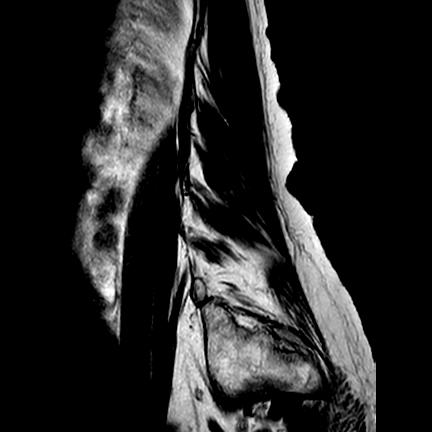

[Series 502: (id) view_ax mpr · axial · 1.0mm · 0.25mm/px · z∈[-79,-53]mm · 2 of 149 slices shown]
[im 9/149]
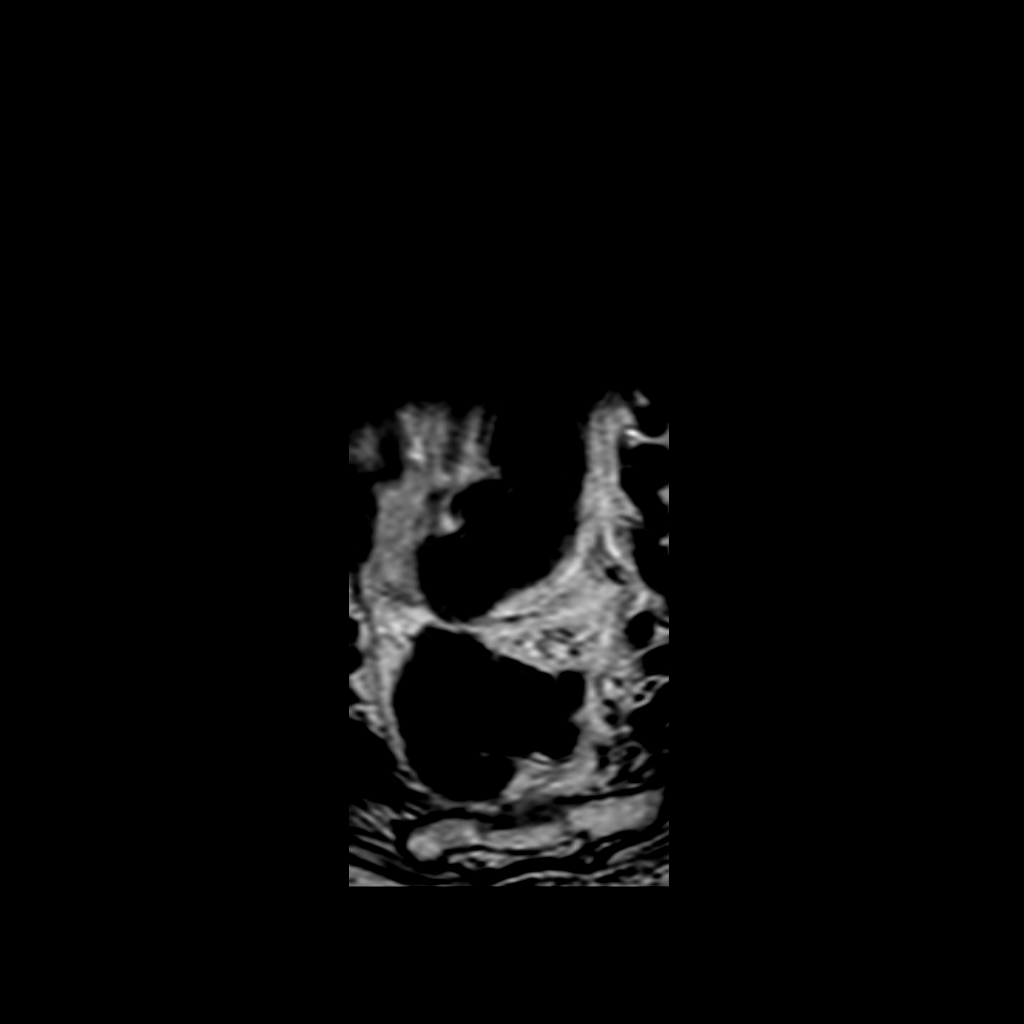
[im 25/149]
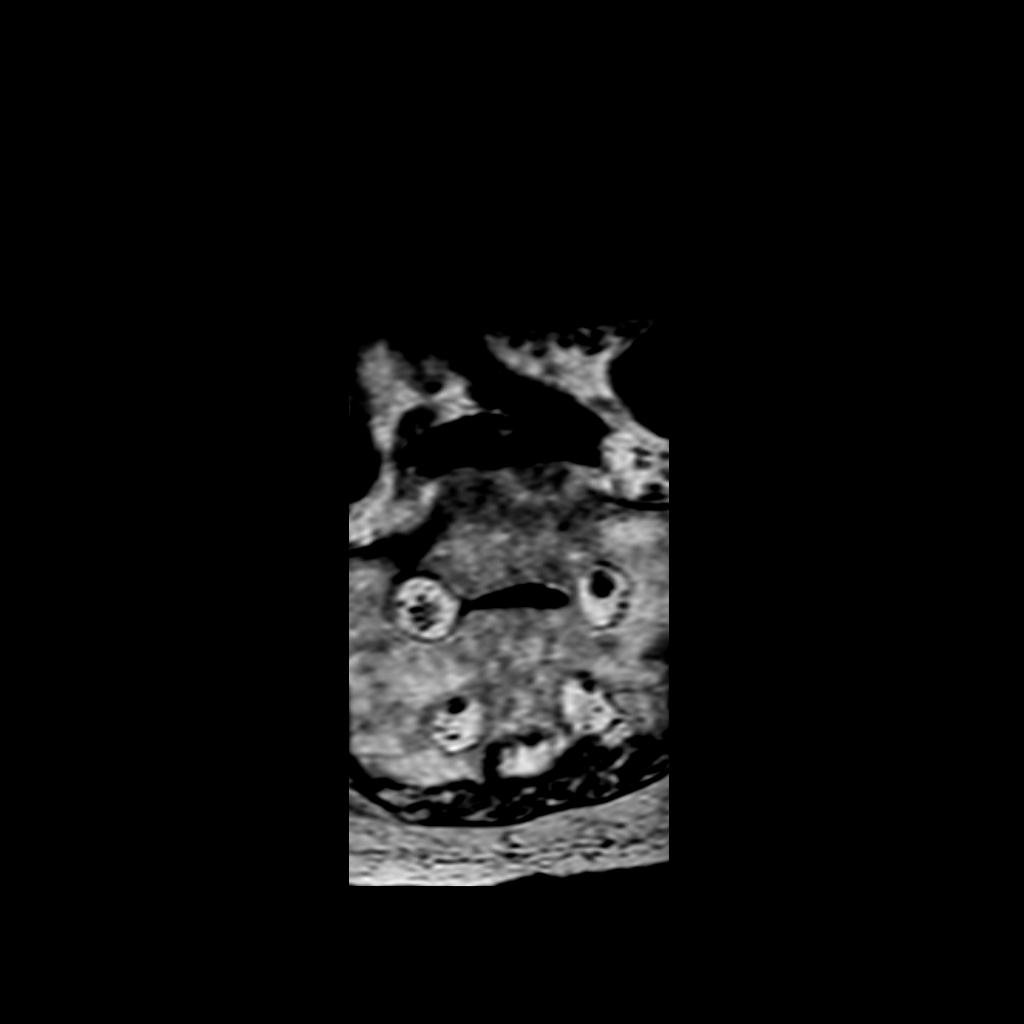

[12 of 48 positions shown; findings below may reference images not displayed]

FINDINGS: -------------------------------------------------------------------------------- 
------ 
GENERAL: 
Nomenclature is based on 5 lumbar type vertebral bodies.     
ALIGNMENT: Mild dextroconvex thoracic lumbar scoliosis. Grade 1 retrolisthesis 
L4 on L5 and L5 on S1. 
VERTEBRAL BODY HEIGHT: Normal.  
MARROW SIGNAL: No focal suspect signal abnormality. 
CORD SIGNAL: Normal distal spinal cord and cauda equina. Conus medullaris 
terminates at L1. 
ADDITIONAL FINDINGS: Distended urinary bladder. Colonic diverticulosis. 
Modic I-II: L5-S1 
Ligamentum Flavum > 2.5 mm: All levels. 
-------------------------------------------------------------------------------- 
------ 
SEGMENTAL: 
T12-L1: Loss of disc signal. Minimal annular bulge. Canal and foramina are 
patent with normal facets. 
L1-L2: Mild loss of disc height and signal. Minimal annular bulge. Otherwise 
normal. 
L2-L3: Loss of disc signal. Minimal annular bulge. Canal and left foramen are 
patent. Mild right foraminal narrowing. Small bilateral facet joint effusions. 
L3-L4: Loss of disc signal. Mild diffuse annular bulge. Dorsal epidural 
lipomatosis. Canal remains patent. Small bilateral facet joint effusions. Mild 
bilateral foraminal narrowing. 
L4-L5: Loss of disc signal. Mild diffuse annular bulge. Small bilateral facet 
joint effusions. Canal patent. Mild right foraminal narrowing. Left foramen 
patent. 
L5-S1: Loss of disc height and signal. Schmorls node. Annular bulge with a 
component of central and left paracentral disc extrusion with some disc material 
extending inferiorly. This abuts the traversing left S1 nerve root with mild 
narrowing left lateral recess. Canal otherwise patent. Borderline left and 
moderate right foraminal narrowing. Facet arthropathy. 
-------------------------------------------------------------------------------- 
------
IMPRESSION: Lumbar degenerative and scoliotic changes. Graph no significant or critical 
central canal narrowing. 
L5-S1, central and left paracentral disc extrusion narrows the left lateral 
recess and abuts the traversing left S1 nerve root. Moderate right foraminal 
narrowing.

## 2023-04-20 IMAGING — MG MAMMOGRAPHY SCREENING BILATERAL 3[PERSON_NAME]
8 series · 9 of 24 positions shown · non-contrast
Comparison: 04/16/2022 and exams dating back to 09/23/2007

________________________________________________________________________________________________ 
MAMMOGRAPHY SCREENING BILATERAL 3CASH WHISENHUNT, 04/20/2023 [DATE]: 
CLINICAL INDICATION: Encounter for screening mammogram.
TECHNIQUE: Digital bilateral mammograms and 3-D Tomosynthesis were obtained. 
These were interpreted both primarily and with the aid of computer-aided 
detection system.  
BREAST DENSITY: (Level B) There are scattered areas of fibroglandular density.

[R CC]
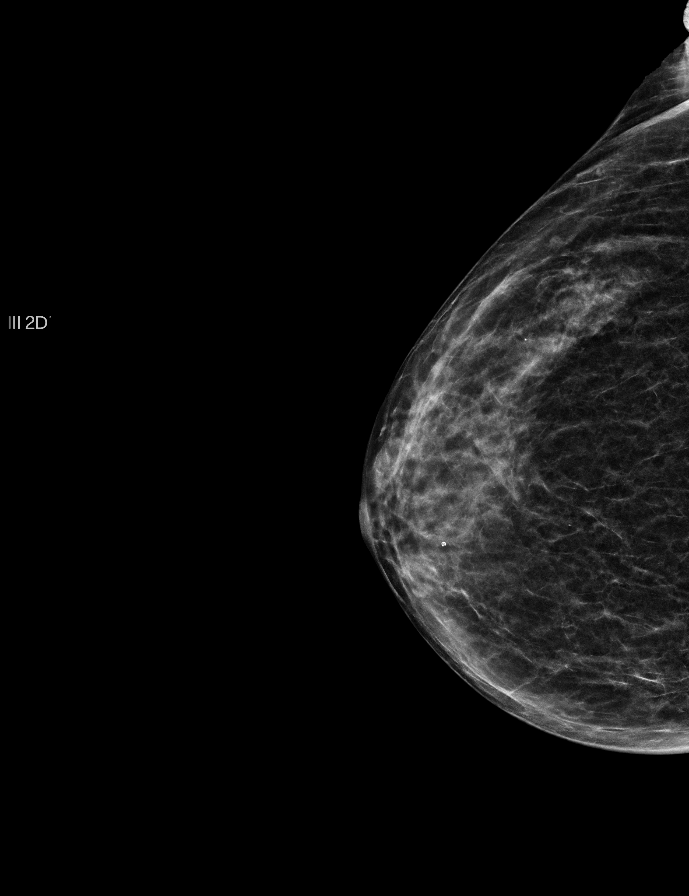

[L MLO]
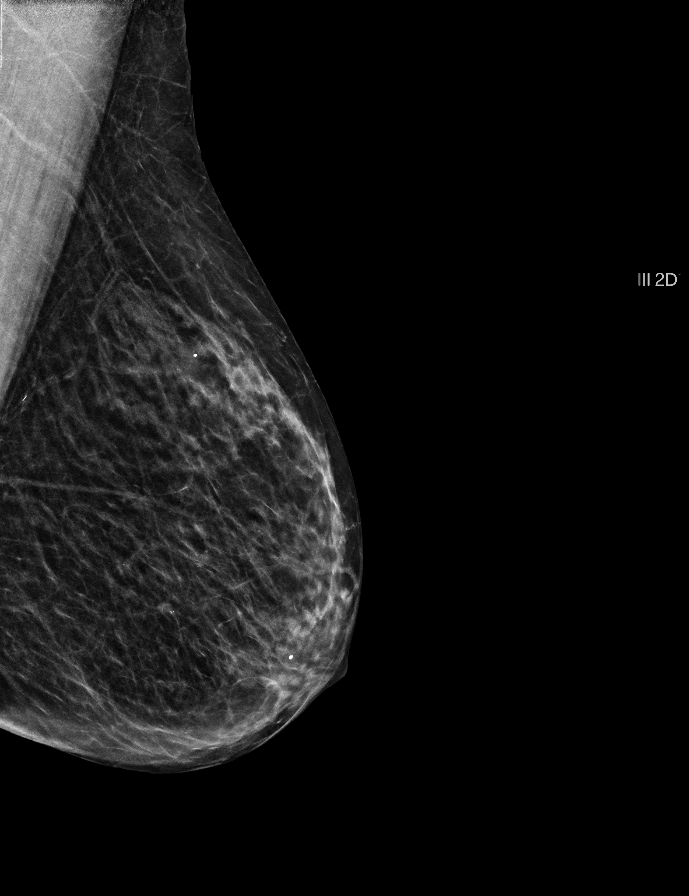

[L CC]
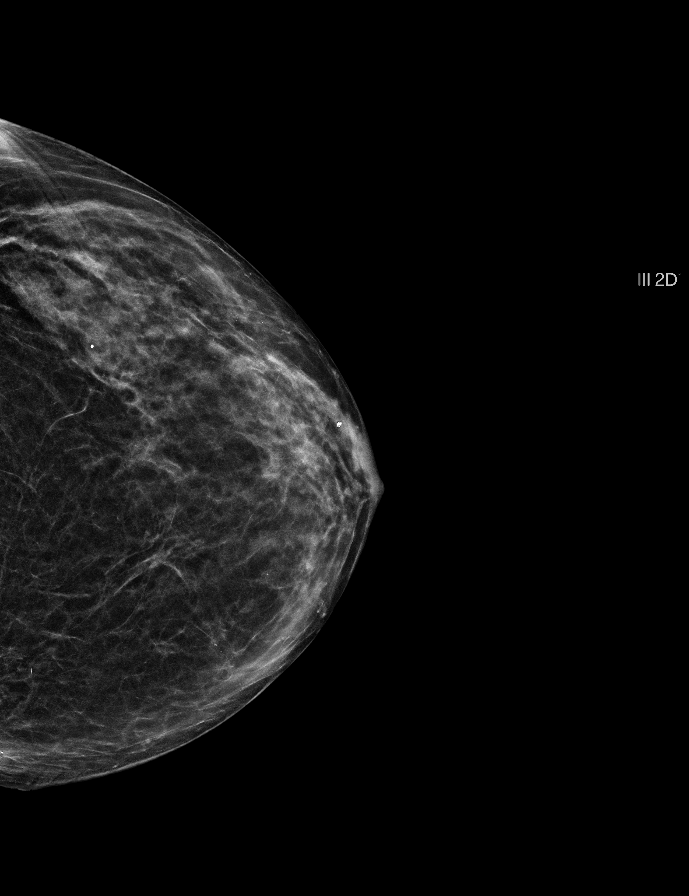

[R MLO]
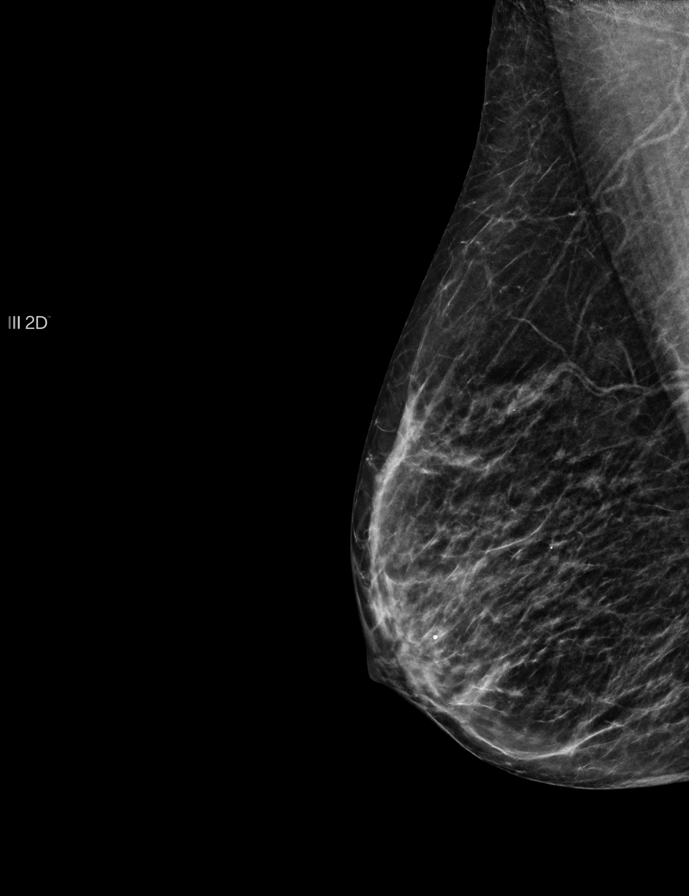

[R CC tomo · 2 of 14 frames shown]
[frame 5/14]
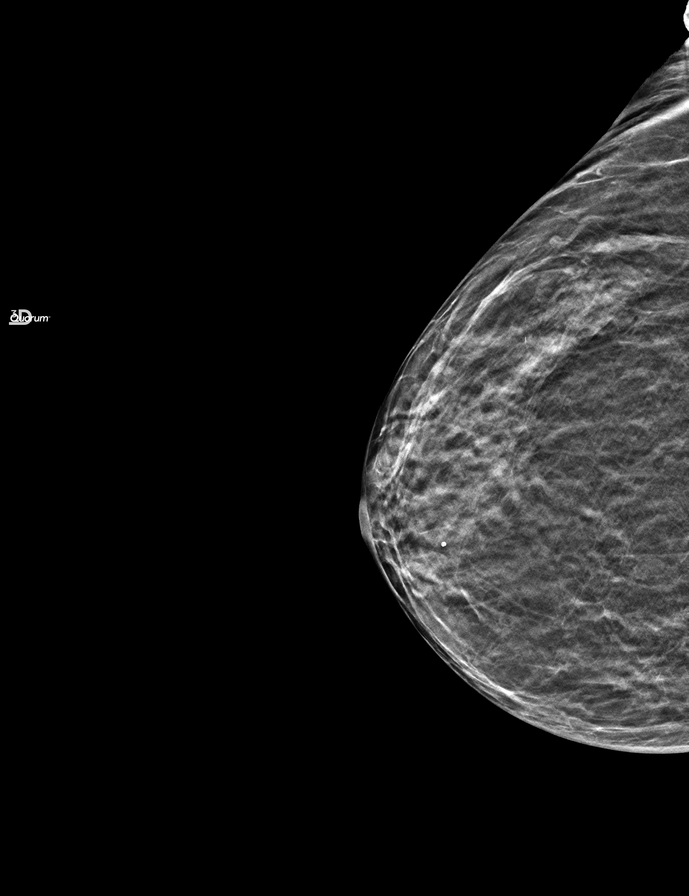
[frame 7/14]
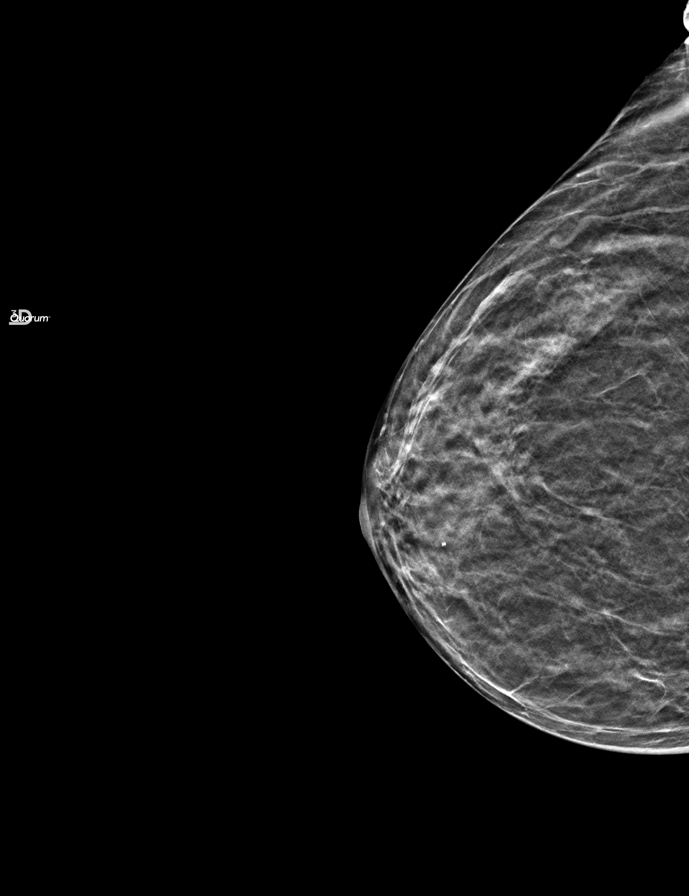

[R MLO tomo · tomo slice 7/13.0]
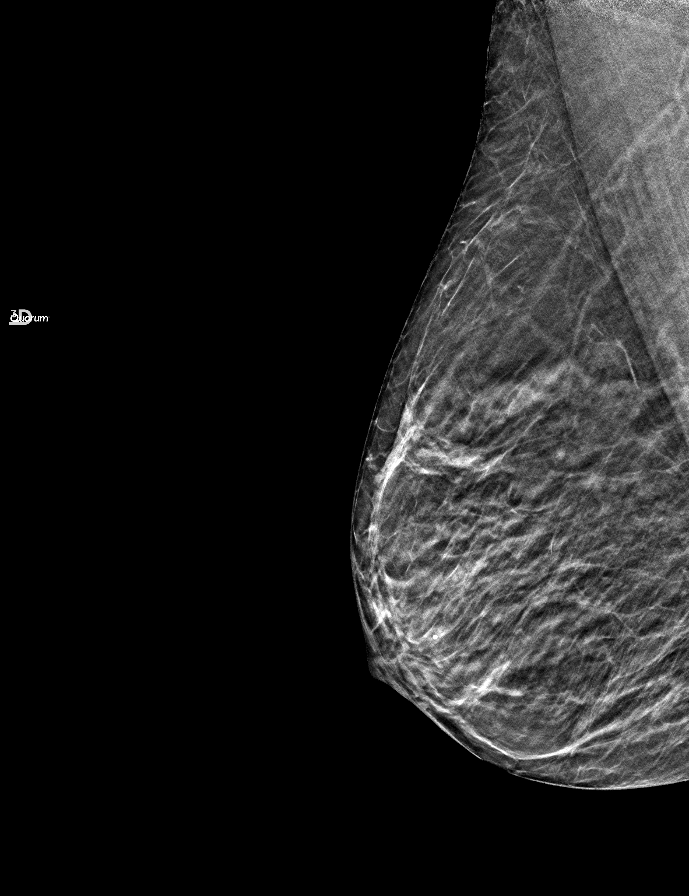

[L CC tomo · tomo slice 7/14.0]
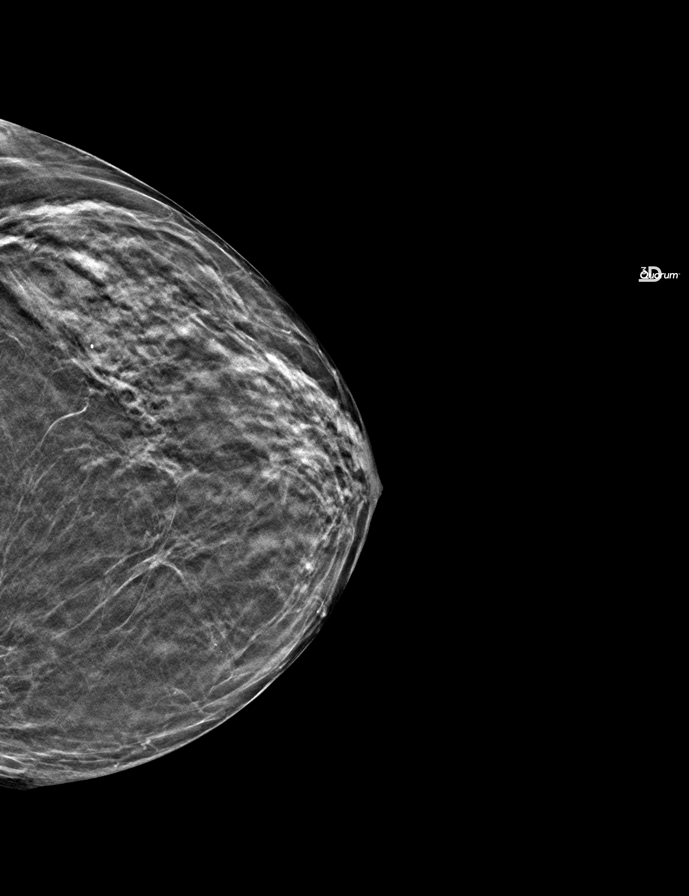

[L MLO tomo · tomo slice 7/14.0]
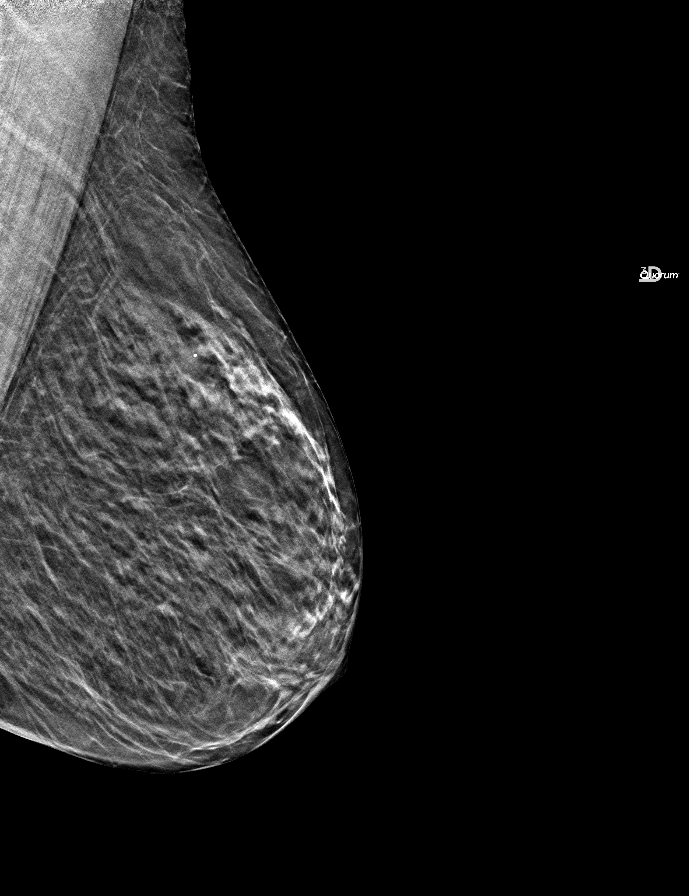

[9 of 24 positions shown; findings below may reference images not displayed]

FINDINGS: No suspicious mass, calcifications, or area of architectural 
distortion in either breast. Overall stable mammographic appearance.
IMPRESSION: No mammographic findings suggestive for malignancy. 
(BI-RADS 2) Benign findings. Routine mammographic follow-up is recommended.
